# Patient Record
Sex: Female | Born: 1950 | Race: White | Hispanic: No | Marital: Married | Smoking: Never smoker
Health system: Southern US, Community
[De-identification: ages and names within clinical notes are randomized; demographics above are authoritative.]

## PROBLEM LIST (undated history)

## (undated) DIAGNOSIS — K589 Irritable bowel syndrome without diarrhea: Secondary | ICD-10-CM

## (undated) DIAGNOSIS — Z9889 Other specified postprocedural states: Secondary | ICD-10-CM

## (undated) DIAGNOSIS — Z8052 Family history of malignant neoplasm of bladder: Secondary | ICD-10-CM

## (undated) DIAGNOSIS — H269 Unspecified cataract: Secondary | ICD-10-CM

## (undated) DIAGNOSIS — R42 Dizziness and giddiness: Secondary | ICD-10-CM

## (undated) DIAGNOSIS — Z8719 Personal history of other diseases of the digestive system: Secondary | ICD-10-CM

## (undated) DIAGNOSIS — Z923 Personal history of irradiation: Secondary | ICD-10-CM

## (undated) DIAGNOSIS — I10 Essential (primary) hypertension: Secondary | ICD-10-CM

## (undated) DIAGNOSIS — M1990 Unspecified osteoarthritis, unspecified site: Secondary | ICD-10-CM

## (undated) DIAGNOSIS — T7840XA Allergy, unspecified, initial encounter: Secondary | ICD-10-CM

## (undated) DIAGNOSIS — K760 Fatty (change of) liver, not elsewhere classified: Secondary | ICD-10-CM

## (undated) DIAGNOSIS — Z800 Family history of malignant neoplasm of digestive organs: Secondary | ICD-10-CM

## (undated) DIAGNOSIS — R011 Cardiac murmur, unspecified: Secondary | ICD-10-CM

## (undated) DIAGNOSIS — R252 Cramp and spasm: Secondary | ICD-10-CM

## (undated) DIAGNOSIS — R12 Heartburn: Secondary | ICD-10-CM

## (undated) DIAGNOSIS — M8580 Other specified disorders of bone density and structure, unspecified site: Secondary | ICD-10-CM

## (undated) DIAGNOSIS — C541 Malignant neoplasm of endometrium: Secondary | ICD-10-CM

## (undated) DIAGNOSIS — C50211 Malignant neoplasm of upper-inner quadrant of right female breast: Secondary | ICD-10-CM

## (undated) DIAGNOSIS — R112 Nausea with vomiting, unspecified: Secondary | ICD-10-CM

## (undated) DIAGNOSIS — R0600 Dyspnea, unspecified: Secondary | ICD-10-CM

## (undated) DIAGNOSIS — K219 Gastro-esophageal reflux disease without esophagitis: Secondary | ICD-10-CM

## (undated) DIAGNOSIS — E785 Hyperlipidemia, unspecified: Secondary | ICD-10-CM

## (undated) DIAGNOSIS — D649 Anemia, unspecified: Secondary | ICD-10-CM

## (undated) DIAGNOSIS — E039 Hypothyroidism, unspecified: Secondary | ICD-10-CM

## (undated) DIAGNOSIS — H18519 Endothelial corneal dystrophy, unspecified eye: Secondary | ICD-10-CM

## (undated) DIAGNOSIS — I864 Gastric varices: Secondary | ICD-10-CM

## (undated) DIAGNOSIS — C55 Malignant neoplasm of uterus, part unspecified: Secondary | ICD-10-CM

## (undated) DIAGNOSIS — C801 Malignant (primary) neoplasm, unspecified: Secondary | ICD-10-CM

## (undated) DIAGNOSIS — E669 Obesity, unspecified: Secondary | ICD-10-CM

## (undated) DIAGNOSIS — A159 Respiratory tuberculosis unspecified: Secondary | ICD-10-CM

## (undated) DIAGNOSIS — H1851 Endothelial corneal dystrophy: Secondary | ICD-10-CM

## (undated) DIAGNOSIS — I8500 Esophageal varices without bleeding: Secondary | ICD-10-CM

## (undated) HISTORY — DX: Cardiac murmur, unspecified: R01.1

## (undated) HISTORY — DX: Fatty (change of) liver, not elsewhere classified: K76.0

## (undated) HISTORY — DX: Other specified disorders of bone density and structure, unspecified site: M85.80

## (undated) HISTORY — DX: Anemia, unspecified: D64.9

## (undated) HISTORY — DX: Esophageal varices without bleeding: I85.00

## (undated) HISTORY — DX: Unspecified osteoarthritis, unspecified site: M19.90

## (undated) HISTORY — DX: Obesity, unspecified: E66.9

## (undated) HISTORY — DX: Malignant neoplasm of uterus, part unspecified: C55

## (undated) HISTORY — DX: Malignant neoplasm of upper-inner quadrant of right female breast: C50.211

## (undated) HISTORY — DX: Irritable bowel syndrome, unspecified: K58.9

## (undated) HISTORY — PX: EYE SURGERY: SHX253

## (undated) HISTORY — DX: Allergy, unspecified, initial encounter: T78.40XA

## (undated) HISTORY — DX: Malignant neoplasm of endometrium: C54.1

## (undated) HISTORY — DX: Hyperlipidemia, unspecified: E78.5

## (undated) HISTORY — PX: COLONOSCOPY: SHX174

## (undated) HISTORY — PX: CATARACT EXTRACTION, BILATERAL: SHX1313

## (undated) HISTORY — PX: ABDOMINAL HYSTERECTOMY: SHX81

## (undated) HISTORY — DX: Heartburn: R12

## (undated) HISTORY — DX: Hypothyroidism, unspecified: E03.9

## (undated) HISTORY — DX: Family history of malignant neoplasm of bladder: Z80.52

## (undated) HISTORY — DX: Family history of malignant neoplasm of digestive organs: Z80.0

## (undated) HISTORY — DX: Malignant (primary) neoplasm, unspecified: C80.1

## (undated) HISTORY — DX: Essential (primary) hypertension: I10

---

## 1999-06-25 ENCOUNTER — Encounter: Payer: Self-pay | Admitting: Internal Medicine

## 2003-11-26 ENCOUNTER — Ambulatory Visit (HOSPITAL_COMMUNITY): Admission: RE | Admit: 2003-11-26 | Discharge: 2003-11-26 | Payer: Self-pay | Admitting: *Deleted

## 2004-11-25 ENCOUNTER — Encounter: Payer: Self-pay | Admitting: Internal Medicine

## 2005-12-18 ENCOUNTER — Other Ambulatory Visit: Admission: RE | Admit: 2005-12-18 | Discharge: 2005-12-18 | Payer: Self-pay | Admitting: *Deleted

## 2006-11-13 ENCOUNTER — Encounter (INDEPENDENT_AMBULATORY_CARE_PROVIDER_SITE_OTHER): Payer: Self-pay | Admitting: *Deleted

## 2006-11-13 ENCOUNTER — Ambulatory Visit (HOSPITAL_COMMUNITY): Admission: RE | Admit: 2006-11-13 | Discharge: 2006-11-13 | Payer: Self-pay | Admitting: Family Medicine

## 2007-04-08 ENCOUNTER — Other Ambulatory Visit: Admission: RE | Admit: 2007-04-08 | Discharge: 2007-04-08 | Payer: Self-pay | Admitting: *Deleted

## 2007-04-22 ENCOUNTER — Telehealth: Payer: Self-pay | Admitting: Family Medicine

## 2007-04-22 ENCOUNTER — Ambulatory Visit: Payer: Self-pay | Admitting: Family Medicine

## 2007-04-22 DIAGNOSIS — H9190 Unspecified hearing loss, unspecified ear: Secondary | ICD-10-CM | POA: Insufficient documentation

## 2007-04-22 DIAGNOSIS — E785 Hyperlipidemia, unspecified: Secondary | ICD-10-CM | POA: Insufficient documentation

## 2007-05-04 ENCOUNTER — Encounter: Payer: Self-pay | Admitting: Family Medicine

## 2007-05-06 ENCOUNTER — Encounter: Payer: Self-pay | Admitting: Family Medicine

## 2007-05-06 LAB — CONVERTED CEMR LAB
ALT: 37 units/L — ABNORMAL HIGH (ref 0–35)
AST: 19 units/L (ref 0–37)
Albumin: 4.4 g/dL (ref 3.5–5.2)
Calcium: 9.6 mg/dL (ref 8.4–10.5)
Creatinine, Ser: 0.88 mg/dL (ref 0.40–1.20)
HDL goal, serum: 40 mg/dL
Potassium: 4.1 meq/L (ref 3.5–5.3)
Total Bilirubin: 0.7 mg/dL (ref 0.3–1.2)
Total Protein: 7.2 g/dL (ref 6.0–8.3)
Triglycerides: 190 mg/dL — ABNORMAL HIGH (ref <150)

## 2007-05-24 ENCOUNTER — Ambulatory Visit: Payer: Self-pay | Admitting: Family Medicine

## 2007-06-01 ENCOUNTER — Encounter: Payer: Self-pay | Admitting: Family Medicine

## 2007-06-02 ENCOUNTER — Telehealth: Payer: Self-pay | Admitting: Family Medicine

## 2007-06-02 ENCOUNTER — Encounter: Payer: Self-pay | Admitting: Family Medicine

## 2008-11-17 ENCOUNTER — Ambulatory Visit: Payer: Self-pay | Admitting: Family Medicine

## 2009-01-16 ENCOUNTER — Ambulatory Visit: Payer: Self-pay | Admitting: Family Medicine

## 2009-01-16 DIAGNOSIS — I10 Essential (primary) hypertension: Secondary | ICD-10-CM | POA: Insufficient documentation

## 2009-01-29 ENCOUNTER — Encounter: Payer: Self-pay | Admitting: Family Medicine

## 2009-01-30 ENCOUNTER — Encounter: Payer: Self-pay | Admitting: Family Medicine

## 2009-01-31 DIAGNOSIS — R740 Nonspecific elevation of levels of transaminase and lactic acid dehydrogenase [LDH]: Secondary | ICD-10-CM

## 2009-01-31 DIAGNOSIS — R7401 Elevation of levels of liver transaminase levels: Secondary | ICD-10-CM | POA: Insufficient documentation

## 2009-01-31 LAB — CONVERTED CEMR LAB
ALT: 73 U/L — ABNORMAL HIGH
AST: 41 U/L — ABNORMAL HIGH
Albumin: 4.3 g/dL
Alkaline Phosphatase: 51 U/L
BUN: 13 mg/dL
CO2: 25 meq/L
Calcium: 9.6 mg/dL
Chloride: 103 meq/L
Cholesterol: 287 mg/dL — ABNORMAL HIGH
Creatinine, Ser: 0.92 mg/dL
Glucose, Bld: 100 mg/dL — ABNORMAL HIGH
HDL: 45 mg/dL
Hep A IgM: NEGATIVE
LDL Cholesterol: 210 mg/dL — ABNORMAL HIGH
Potassium: 3.9 meq/L
Sodium: 141 meq/L
Total Bilirubin: 0.6 mg/dL
Total CHOL/HDL Ratio: 6.4
Total Protein: 7.2 g/dL
Triglycerides: 161 mg/dL — ABNORMAL HIGH
VLDL: 32 mg/dL

## 2009-02-06 ENCOUNTER — Ambulatory Visit (HOSPITAL_COMMUNITY): Admission: RE | Admit: 2009-02-06 | Discharge: 2009-02-06 | Payer: Self-pay | Admitting: Family Medicine

## 2009-02-07 ENCOUNTER — Encounter: Payer: Self-pay | Admitting: Family Medicine

## 2009-02-09 ENCOUNTER — Ambulatory Visit: Payer: Self-pay | Admitting: Family Medicine

## 2009-02-09 LAB — CONVERTED CEMR LAB: Cholesterol, target level: 200 mg/dL

## 2009-06-08 ENCOUNTER — Ambulatory Visit: Payer: Self-pay | Admitting: Family Medicine

## 2009-06-29 ENCOUNTER — Ambulatory Visit: Payer: Self-pay | Admitting: Family Medicine

## 2009-06-29 ENCOUNTER — Telehealth: Payer: Self-pay | Admitting: Internal Medicine

## 2009-07-03 ENCOUNTER — Encounter: Payer: Self-pay | Admitting: Family Medicine

## 2009-07-19 ENCOUNTER — Telehealth: Payer: Self-pay | Admitting: Family Medicine

## 2009-07-26 DIAGNOSIS — K648 Other hemorrhoids: Secondary | ICD-10-CM | POA: Insufficient documentation

## 2009-07-26 DIAGNOSIS — K760 Fatty (change of) liver, not elsewhere classified: Secondary | ICD-10-CM | POA: Insufficient documentation

## 2009-07-26 DIAGNOSIS — Z860100 Personal history of colon polyps, unspecified: Secondary | ICD-10-CM | POA: Insufficient documentation

## 2009-07-26 DIAGNOSIS — K208 Other esophagitis without bleeding: Secondary | ICD-10-CM | POA: Insufficient documentation

## 2009-07-26 DIAGNOSIS — K5730 Diverticulosis of large intestine without perforation or abscess without bleeding: Secondary | ICD-10-CM | POA: Insufficient documentation

## 2009-07-30 ENCOUNTER — Ambulatory Visit: Payer: Self-pay | Admitting: Internal Medicine

## 2009-09-18 ENCOUNTER — Ambulatory Visit: Payer: Self-pay | Admitting: Internal Medicine

## 2009-10-05 ENCOUNTER — Telehealth: Payer: Self-pay | Admitting: Family Medicine

## 2010-03-08 ENCOUNTER — Ambulatory Visit: Payer: Self-pay | Admitting: Family Medicine

## 2010-03-08 ENCOUNTER — Encounter: Admission: RE | Admit: 2010-03-08 | Discharge: 2010-03-08 | Payer: Self-pay | Admitting: Family Medicine

## 2010-03-08 DIAGNOSIS — M479 Spondylosis, unspecified: Secondary | ICD-10-CM | POA: Insufficient documentation

## 2010-03-08 DIAGNOSIS — Z780 Asymptomatic menopausal state: Secondary | ICD-10-CM | POA: Insufficient documentation

## 2010-03-12 LAB — CONVERTED CEMR LAB
ALT: 79 units/L — ABNORMAL HIGH (ref 0–35)
AST: 47 units/L — ABNORMAL HIGH (ref 0–37)
Albumin: 4.3 g/dL (ref 3.5–5.2)
Alkaline Phosphatase: 46 units/L (ref 39–117)
BUN: 13 mg/dL (ref 6–23)
CO2: 25 meq/L (ref 19–32)
Calcium: 9.8 mg/dL (ref 8.4–10.5)
Chloride: 101 meq/L (ref 96–112)
Sodium: 139 meq/L (ref 135–145)
Total Bilirubin: 0.6 mg/dL (ref 0.3–1.2)
Total CHOL/HDL Ratio: 5.4
VLDL: 33 mg/dL (ref 0–40)

## 2010-07-10 ENCOUNTER — Ambulatory Visit: Payer: Self-pay | Admitting: Family Medicine

## 2010-07-10 DIAGNOSIS — M62838 Other muscle spasm: Secondary | ICD-10-CM | POA: Insufficient documentation

## 2010-07-10 DIAGNOSIS — L821 Other seborrheic keratosis: Secondary | ICD-10-CM | POA: Insufficient documentation

## 2010-07-11 ENCOUNTER — Encounter: Payer: Self-pay | Admitting: Family Medicine

## 2010-07-12 LAB — CONVERTED CEMR LAB
Albumin: 4 g/dL (ref 3.5–5.2)
Alkaline Phosphatase: 37 units/L — ABNORMAL LOW (ref 39–117)
BUN: 13 mg/dL (ref 6–23)
CO2: 27 meq/L (ref 19–32)
Chloride: 102 meq/L (ref 96–112)
Creatinine, Ser: 0.89 mg/dL (ref 0.40–1.20)
HDL: 51 mg/dL (ref >39)
Total Bilirubin: 0.7 mg/dL (ref 0.3–1.2)
Total CHOL/HDL Ratio: 3.4
VLDL: 21 mg/dL (ref 0–40)

## 2010-08-05 ENCOUNTER — Telehealth: Payer: Self-pay | Admitting: Family Medicine

## 2010-08-06 ENCOUNTER — Telehealth: Payer: Self-pay | Admitting: Family Medicine

## 2010-08-19 ENCOUNTER — Encounter
Admission: RE | Admit: 2010-08-19 | Discharge: 2010-09-12 | Payer: Self-pay | Source: Home / Self Care | Attending: Family Medicine | Admitting: Family Medicine

## 2010-08-21 ENCOUNTER — Encounter: Payer: Self-pay | Admitting: Family Medicine

## 2010-09-17 ENCOUNTER — Encounter
Admission: RE | Admit: 2010-09-17 | Discharge: 2010-10-15 | Payer: Self-pay | Source: Home / Self Care | Attending: Family Medicine | Admitting: Family Medicine

## 2010-09-17 ENCOUNTER — Encounter: Admission: RE | Admit: 2010-09-17 | Payer: Self-pay | Source: Home / Self Care | Admitting: Family Medicine

## 2010-09-23 ENCOUNTER — Encounter: Payer: Self-pay | Admitting: Family Medicine

## 2010-10-15 NOTE — Assessment & Plan Note (Signed)
Summary: 4 mo. f/u HTN, lipids, trap pain    Vital Signs:  Patient profile:   60 year old female Height:      64 inches Weight:      177 pounds Pulse rate:   78 / minute BP sitting:   127 / 84  (right arm) Cuff size:   regular  Vitals Entered By: Avon Gully CMA, (AAMA) (July 10, 2010 9:30 AM) CC: f/u BP and cholesterol pt is not fasting this am   Primary Care Provider:  Timothy Lasso, MD  CC:  f/u BP and cholesterol pt is not fasting this am.  History of Present Illness: f/u BP and cholesterol pt is not fasting this am. Doing well on teh crestor. No myalgias. Has been walking 3 miles at nightl. Moved his her ASA at night. 81mg .   Getting cramps in her left shouder at work. Does work at the computer all day. It gets really tight. Wil luse IBU at night for pain relief. Says can feel a knot there when it bothers here. No real alleviating sxs.    Has a moleon the left inner thigh that shew would like me to look at. Says it rubs on her pants and gets very sore and tender.   Current Medications (verified): 1)  Multivitamin/iron   Tabs (Multiple Vitamins-Iron) .... Take One Tablet By Mouth Once Aday 2)  Fish Oil 1200 Mg Caps (Omega-3 Fatty Acids) .Marland Kitchen.. 1 Tab By Mouth Once Daily 3)  Benicar Hct 20-12.5 Mg Tabs (Olmesartan Medoxomil-Hctz) .... Take 1 Tablet By Mouth Once A Day 4)  Crestor 10 Mg Tabs (Rosuvastatin Calcium) .... Take 1 Tablet By Mouth Once A Day 5)  Aspirin 81 Mg Tbec (Aspirin) .... Take 1 Tab By Mouth Once Daily 6)  Nasonex 50 Mcg/act Susp (Mometasone Furoate) .... 2 Sprays Per Nostril Once Daily  Allergies (verified): 1)  ! Pcn  Comments:  Nurse/Medical Assistant: The patient's medications and allergies were reviewed with the patient and were updated in the Medication and Allergy Lists. 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  Married to [place] with 2 adult children.  Lives with her husband and step daughter.   Never Smoked Alcohol use-no Drug use-no Regular exercise-yes  Physical Exam  General:  Well-developed,well-nourished,in no acute distress; alert,appropriate and cooperative throughout examination Head:  Normocephalic and atraumatic without obvious abnormalities. No apparent alopecia or balding. Eyes:  No corneal or conjunctival inflammation noted. EOMI. Perrla. Lungs:  Normal respiratory effort, chest expands symmetrically. Lungs are clear to auscultation, no crackles or wheezes. Heart:  Normal rate and regular rhythm. S1 and S2 normal without gallop, murmur, click, rub or other extra sounds. Msk:  Left shoulder and neck with NROM. Stegnth 5/5 in the left shoulder, elbow and hand. She is mildy tender over teh trap area.  Skin:  Left inner thigh with 0.5cm seb keratosis.    Impression & Recommendations:  Problem # 1:  HYPERTENSION, BENIGN (ICD-401.1) Blood pressure is at goal.   Her updated medication list for this problem includes:    Benicar Hct 20-12.5 Mg Tabs (Olmesartan medoxomil-hctz) .Marland Kitchen... Take 1 tablet by mouth once a day  BP today: 127/84 Prior BP: 136/90 (03/08/2010)  Prior 10 Yr Risk Heart Disease: 17 % (06/29/2009)  Labs Reviewed: K+: 3.9 (03/08/2010) Creat: : 0.90 (03/08/2010)   Chol: 282 (03/08/2010)   HDL: 52 (03/08/2010)  LDL: 197 (03/08/2010)   TG: 166 (03/08/2010)  Problem # 2:  HYPERLIPIDEMIA (ICD-272.4) Due to recheck levels on the crestor.  Her updated medication list for this problem includes:    Crestor 10 Mg Tabs (Rosuvastatin calcium) .Marland Kitchen... Take 1 tablet by mouth once a day  Orders: T-Lipid Profile (04540-98119) T-Comprehensive Metabolic Panel (14782-95621)  Problem # 3:  SEBORRHEIC KERATOSIS (ICD-702.19)  Performed cryotherapy. Pt tolerated well and f/u wound instructions reviewed.   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 Hopefully tehy can also help with the ergonomics of her work station as well.  Orders: Physical Therapy Referral (PT)  Complete Medication List: 1)  Multivitamin/iron Tabs (Multiple vitamins-iron) .... Take one tablet by mouth once aday 2)  Fish Oil 1200 Mg Caps (omega-3 Fatty Acids)  .Marland Kitchen.. 1 tab by mouth once daily 3)  Benicar Hct 20-12.5 Mg Tabs (Olmesartan medoxomil-hctz) .... Take 1 tablet by mouth once a day 4)  Crestor 10 Mg Tabs (Rosuvastatin calcium) .... Take 1 tablet by mouth once a day 5)  Aspirin 81 Mg Tbec (Aspirin) .... Take 1 tab by mouth once daily 6)  Nasonex 50 Mcg/act Susp (Mometasone furoate) .... 2 sprays per nostril once daily  Patient Instructions: 1)  We will call you with the referrral to Physical therapy 2)  Can wash lesion in the shower. Will peel off in 1-2 weeks. Can apply vaseline to promote healing once peels off. 3)  We will call you with your lab results.  4)  Follow up in 6 weeks for your shoulder.    Orders Added: 1)  T-Lipid Profile [80061-22930] 2)  T-Comprehensive Metabolic Panel [80053-22900] 3)  Physical Therapy Referral [PT] 4)  Est. Patient Level IV [30865] 5)  Cryotherapy/Destruction benign or premalignant lesion (1st lesion)  [17000]

## 2010-10-15 NOTE — Assessment & Plan Note (Signed)
Summary: CPE w/o pap   Vital Signs:  Patient profile:   60 year old female Height:      64 inches Weight:      176 pounds BMI:     30.32 O2 Sat:      98 % on Room air Pulse rate:   67 / minute BP sitting:   136 / 90  (left arm) Cuff size:   regular  Vitals Entered By: Payton Spark CMA (March 08, 2010 9:05 AM)  O2 Flow:  Room air CC: CPE w/ out pap   Primary Care Provider:  Timothy Lasso, MD  CC:  CPE w/ out pap.  History of Present Illness: 60 yo WF presents for CPE without pap smear.  She is postmenopausal x 3 yrs, sees Dr Rana Snare for pap smears.  She is not on HRT.  Had her colonoscopy in Jan w/ Dr Juanda Chance, normal other than Diverticulosis.    She is due for her mammogram and DEXA Scan.  she is due for fasting labs, an annual eye exam and an annual dermatology visit for hx of abnormal moles.  She is due for RF of meds.  C/O pain over L thenar eminence after a fall 2 wks ago.  Full ROM and strength w/o pain in the wrist.  No redness, bruising or swelling.    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      Current Medications (verified): 1)  Multivitamin/iron   Tabs (Multiple Vitamins-Iron) .... Take One Tablet By Mouth Once Aday 2)  Adult Aspirin Ec Low Strength 81 Mg  Tbec (Aspirin) .... Take One Tablet By Mouth Once A Day 3)  Fish Oil 1200 Mg Caps (Omega-3 Fatty Acids) .Marland Kitchen.. 1 Tab By Mouth Once Daily 4)  Benicar Hct 20-12.5 Mg Tabs (Olmesartan Medoxomil-Hctz) .... Take 1 Tablet By Mouth Once A Day 5)  Crestor 10 Mg Tabs (Rosuvastatin Calcium) .... Take 1 Tablet By Mouth Once A Day  Allergies (verified): 1)  ! Pcn  Past History:  Past Medical History: Reviewed history from 07/30/2009 and no changes required. 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  OA of the neck.  Anemia Arthritis Adenomatous Colon Polyps Hypertension Hyperthyroidism Irritable  Bowel Syndrome Obesity  Past Surgical History: Reviewed history from 04/22/2007 and no changes required. None  Family History: Reviewed history from 07/30/2009 and no changes required. Father died Bladder Ca, MI in his 86s, Hi chol Mother died from stroke Family History of CAD Female 1st degree relative <50 Family History of Colon Cancer:Father Family History of Diabetes: GF Family History of Kidney Disease:GM  Social History: Reviewed history from 07/26/2009 and no changes required. 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  Lives with her husband and step daughter.   Never Smoked Alcohol use-no Drug use-no Regular exercise-yes  Review of Systems      See HPI  Physical Exam  General:  alert, well-developed, well-nourished, and well-hydrated.  obese Head:  normocephalic and atraumatic.   Eyes:  pupils equal, pupils round, and pupils reactive to light.   Ears:  EACs patent; TMs translucent and gray with good cone of light and bony landmarks.  Nose:  no nasal discharge.   Mouth:  pharynx pink and moist and fair dentition.   Neck:  no masses.   Lungs:  Normal respiratory effort, chest expands  symmetrically. Lungs are clear to auscultation, no crackles or wheezes. Heart:  Normal rate and regular rhythm. S1 and S2 normal without gallop, murmur, click, rub or other extra sounds. Abdomen:  Bowel sounds positive,abdomen soft and non-tender without masses, organomegaly or hernias noted. Msk:  normal appearing L hand.  slight point tenderness over L thenar eminence w/o skin changes or edema.  full L forearm ROM and full ROM and strenghth with grip strength and opposition R and L hands. Pulses:  2+ radial and pedal pulses Extremities:  no E/C/C Skin:  fair skinned with sun damage Cervical Nodes:  No lymphadenopathy noted Psych:  good eye contact, not anxious appearing, and not depressed appearing.     Impression &  Recommendations:  Problem # 1:  HEALTH SCREENING (ICD-V70.0) Keeping healthy checklist for women reviewed. BP at goal, DBP at the ULN.  RFd meds. Update labs. Update Mammo + DEXA. Work on [employer], regular exercise, wt loss for BMI >30 c/w class I obesity. MVI + Calcium with D recommended. She has f/u with optometry and her dermatolgist for annual surveillance. Colonoscopy done Jan 2011 with Dr Juanda Chance.  Complete Medication List: 1)  Multivitamin/iron Tabs (Multiple vitamins-iron) .... Take one tablet by mouth once aday 2)  Adult Aspirin Ec Low Strength 81 Mg Tbec (Aspirin) .... Take one tablet by mouth once a day 3)  Fish Oil 1200 Mg Caps (omega-3 Fatty Acids)  .Marland Kitchen.. 1 tab by mouth once daily 4)  Benicar Hct 20-12.5 Mg Tabs (Olmesartan medoxomil-hctz) .... Take 1 tablet by mouth once a day 5)  Crestor 10 Mg Tabs (Rosuvastatin calcium) .... Take 1 tablet by mouth once a day 6)  Aspirin 81 Mg Tbec (Aspirin) .... Take 1 tab by mouth once daily 7)  Nasonex 50 Mcg/act Susp (Mometasone furoate) .... 2 sprays per nostril once daily  Other Orders: T-Mammography Bilateral Screening (40347) T-DXA Bone Density/ Appendicular (42595) T-Dual DXA Bone Density/ Axial (63875) T-Comprehensive Metabolic Panel (64332-95188) T-Lipid Profile (41660-63016) T-DG Cervical Spine 2-3 Views (01093) Prescriptions: CRESTOR 10 MG TABS (ROSUVASTATIN CALCIUM) Take 1 tablet by mouth once a day  #90 x 1   Entered and Authorized by:   Seymour Bars DO   Signed by:   Seymour Bars DO on 03/08/2010   Method used:   Electronically to        [facility]* (retail)       [address]       [address] Shipping/mailing       [place], [place]  [address]       Ph: [phone]       Fax: [phone]   RxID:   [id number] BENICAR HCT 20-12.5 MG TABS (OLMESARTAN MEDOXOMIL-HCTZ) Take 1 tablet by mouth once a day  #90 x 1   Entered and Authorized by:   Seymour Bars DO   Signed by:   Seymour Bars DO on  03/08/2010   Method used:   Electronically to        [facility]* (retail)       [address]       [address] Shipping/mailing       [place], [place]  [address]       Ph: [phone]       Fax: [phone]   RxID:   [id number]

## 2010-10-15 NOTE — Procedures (Signed)
Summary: Colonoscopy  Patient: Dawn [name] Note: All result statuses are Final unless otherwise noted.  Tests: (1) Colonoscopy (COL)   COL Colonoscopy           DONE (C)     [facility]     [address]     [place], [place]  [address]           COLONOSCOPY PROCEDURE REPORT           PATIENT:  Dawn [name], Dawn [name]  MR#:  [id number]     BIRTHDATE:  04/26/1951, 58 yrs. old  GENDER:  female           ENDOSCOPIST:  Hedwig Morton. Juanda Chance, MD     Referred by:  Nani Gasser, M.D.           PROCEDURE DATE:  09/18/2009     PROCEDURE:  Colonoscopy 19147     ASA CLASS:  Class I     INDICATIONS:  family history of colon cancer, family Hx of polyps     father with colon cancer, brother with colon polyps,     hyperplastic polyp 2006,, prior colon in 2000,           MEDICATIONS:   Fentanyl 100 mcg, Versed 8 mg IV           DESCRIPTION OF PROCEDURE:   After the risks benefits and     alternatives of the procedure were thoroughly explained, informed     consent was obtained.  Digital rectal exam was performed and     revealed no rectal masses.   The LB CF-H180AL [id number] endoscope     was introduced through the anus and advanced to the cecum, which     was identified by both the appendix and ileocecal valve, without     limitations.  The quality of the prep was good, using MiraLax.     The instrument was then slowly withdrawn as the colon was fully     examined.     <<PROCEDUREIMAGES>>           FINDINGS:  Mild diverticulosis was found in the sigmoid colon (see     image1, image6, and image5).  This was otherwise a normal     examination of the colon (see image7, image4, image3, and image2).     Retroflexed views in the rectum revealed no abnormalities.    The     scope was then withdrawn from the patient and the procedure     completed.           COMPLICATIONS:  None           ENDOSCOPIC IMPRESSION:     1) Mild diverticulosis in the sigmoid colon     2) Otherwise normal  examination     RECOMMENDATIONS:     1) high fiber diet           REPEAT EXAM:  In 5 year(s) for.           ______________________________     Hedwig Morton. Juanda Chance, MD           CC:           n.     REVISED:  09/19/2009 02:55 PM     eSIGNED:   Hedwig Morton. Serayah Yazdani at 09/19/2009 02:55 PM           Loletha Carrow, [id number]  Note: An exclamation mark (!) indicates a result that was not dispersed into the  flowsheet. Document Creation Date: 09/19/2009 2:54 PM _______________________________________________________________________  (1) Order result status: Final Collection or observation date-time: 09/18/2009 09:34 Requested date-time:  Receipt date-time:  Reported date-time:  Referring Physician:   Ordering Physician: Lina Sar [phone]) Specimen Source:  Source: Launa Grill Order Number: [id number] Lab site:

## 2010-10-15 NOTE — Procedures (Signed)
Summary: Colonoscopy  Patient: Dawn [name] Note: All result statuses are Final unless otherwise noted.  Tests: (1) Colonoscopy (COL)   COL Colonoscopy           DONE     [facility]     [address]     [place], [place]  [address]           COLONOSCOPY PROCEDURE REPORT           PATIENT:  Dawn [name], Dawn [name]  MR#:  [id number]     BIRTHDATE:  [birth year], 58 yrs. old  GENDER:  female           ENDOSCOPIST:  Hedwig Morton. Juanda Chance, MD     Referred by:  Nani Gasser, M.D.           PROCEDURE DATE:  09/18/2009     PROCEDURE:  Colonoscopy 19147     ASA CLASS:  Class I     INDICATIONS:  family history of colon cancer, family Hx of polyps     father with colon cancer, brother with colon polyps,     hyperplastic polyp 2006,, prior colon in 2000,           MEDICATIONS:   Versed 9 mg, Fentanyl 100 mcg           DESCRIPTION OF PROCEDURE:   After the risks benefits and     alternatives of the procedure were thoroughly explained, informed     consent was obtained.  Digital rectal exam was performed and     revealed no rectal masses.   The LB CF-H180AL [id number] endoscope     was introduced through the anus and advanced to the cecum, which     was identified by both the appendix and ileocecal valve, without     limitations.  The quality of the prep was good, using MiraLax.     The instrument was then slowly withdrawn as the colon was fully     examined.     <<PROCEDUREIMAGES>>           FINDINGS:  Mild diverticulosis was found in the sigmoid colon (see     image1, image6, and image5).  This was otherwise a normal     examination of the colon (see image7, image4, image3, and image2).     Retroflexed views in the rectum revealed no abnormalities.    The     scope was then withdrawn from the patient and the procedure     completed.           COMPLICATIONS:  None           ENDOSCOPIC IMPRESSION:     1) Mild diverticulosis in the sigmoid colon     2) Otherwise normal examination  RECOMMENDATIONS:     1) high fiber diet           REPEAT EXAM:  In 5 year(s) for.           ______________________________     Hedwig Morton. Juanda Chance, MD           CC:           n.     eSIGNED:   Hedwig Morton. Haylo Fake at 09/18/2009 09:43 AM           Loletha Carrow, [id number]  Note: An exclamation mark (!) indicates a result that was not dispersed into the flowsheet. Document Creation Date: 09/18/2009 9:43 AM _______________________________________________________________________  (1) Order result status: Final Collection  or observation date-time: 09/18/2009 09:34 Requested date-time:  Receipt date-time:  Reported date-time:  Referring Physician:   Ordering Physician: Lina Sar [phone]) Specimen Source:  Source: Launa Grill Order Number: [id number] Lab site:   Appended Document: Colonoscopy    Clinical Lists Changes  Observations: Added new observation of COLONNXTDUE: 09/2014 (09/18/2009 10:52)

## 2010-10-15 NOTE — Progress Notes (Signed)
Summary: rash  Phone Note Call from Patient Call back at Home Phone [phone]   Caller: Patient Call For: Nani Gasser MD Summary of Call: Pt calls and states you gave her some Nystatin cream for her right inner thigh rash and now has rash on left inner thigh and wanted to know if she could get a refill on this sent to [facility] in [place] Initial call taken by: Kathlene November,  October 05, 2009 10:15 AM  Follow-up for Phone Call        OK to refill.  Follow-up by: Nani Gasser MD,  October 05, 2009 10:28 AM    New/Updated Medications: NYSTATIN 100000 UNIT/GM CREA (NYSTATIN) Apply two times a day to affected area for 14 days Prescriptions: NYSTATIN 100000 UNIT/GM CREA (NYSTATIN) Apply two times a day to affected area for 14 days  #1 tube x 1   Entered and Authorized by:   Nani Gasser MD   Signed by:   Nani Gasser MD on 10/05/2009   Method used:   Electronically to        [facility]* (retail)       [address] [place], [place]  [address]       Ph: [phone]       Fax: [phone]   RxID:   [id number]

## 2010-10-15 NOTE — Progress Notes (Signed)
Summary: needs a new PT referral  Phone Note Call from Patient   Caller: Patient Summary of Call: Pt .needs a new referral because it has to be 30 days from her appt date and she is now scheduled for 12/9, and just now called PT  back today.Michaelle Copas  August 06, 2010 1:44 PM  Initial call taken by: Michaelle Copas,  August 06, 2010 1:44 PM  Follow-up for Phone Call        New order put in.  Follow-up by: Nani Gasser MD,  August 06, 2010 2:46 PM

## 2010-10-15 NOTE — Progress Notes (Signed)
Summary: Didn't go to PT  ---- Converted from flag ---- ---- 08/05/2010 9:46 AM, Michaelle Copas wrote: This patient never did schedule her Physical Therapy appt... Physical Therapy has called her and myself has called her and she has not returned our calls.Marland KitchenMarland KitchenJust wanted to let you know.Marland KitchenThanks, Victorino Dike ------------------------------

## 2010-10-17 NOTE — Miscellaneous (Signed)
Summary: PT Discharge/[facility]  PT Discharge/[facility]   Imported By: Lanelle Bal 10/09/2010 11:48:30  _____________________________________________________________________  External Attachment:    Type:   Image     Comment:   External Document

## 2010-10-17 NOTE — Miscellaneous (Signed)
Summary: PT Initial Summary/[facility]  PT Initial [facility]   Imported By: Lanelle Bal 08/30/2010 11:29:04  _____________________________________________________________________  External Attachment:    Type:   Image     Comment:   External Document

## 2011-01-31 ENCOUNTER — Ambulatory Visit (INDEPENDENT_AMBULATORY_CARE_PROVIDER_SITE_OTHER): Payer: 59 | Admitting: Family Medicine

## 2011-01-31 ENCOUNTER — Encounter: Payer: Self-pay | Admitting: Family Medicine

## 2011-01-31 VITALS — BP 134/87 | HR 85 | Temp 98.5°F | Ht 64.0 in

## 2011-01-31 DIAGNOSIS — J029 Acute pharyngitis, unspecified: Secondary | ICD-10-CM

## 2011-01-31 DIAGNOSIS — H8109 Meniere's disease, unspecified ear: Secondary | ICD-10-CM | POA: Insufficient documentation

## 2011-01-31 LAB — POCT RAPID STREP A (OFFICE): Rapid Strep A Screen: NEGATIVE

## 2011-01-31 MED ORDER — PROMETHAZINE HCL 25 MG PO TABS: 25.0000 mg | ORAL_TABLET | Freq: Four times a day (QID) | ORAL | Status: AC | PRN

## 2011-01-31 NOTE — Progress Notes (Signed)
  Subjective:    Patient ID: Dawn [name], female    DOB: Dec 14, 1950, 60 y.o.   MRN: [id number]  HPI Hx of menieres.  Had an episode last months.  She was on oral  Steroids  For 5 days last monght.  She was also given meclizine 25mg  bid ot use prn. She is also having ST now.  Episodes started again Monday night ( 5 days ago). Felt so dizzy she vomited. She doesn't have any thing for nausea. She thinks she is getting a cold as she has had a ST. Husband has been sick as well with cough etc. No fever or cough.   She feels her mouth is dry all the time.  Wonders if she should dec her diuretic.    Review of Systems     Objective:   Physical Exam  Constitutional: She appears well-developed and well-nourished.  HENT:  Head: Normocephalic and atraumatic.  Right Ear: External ear normal.  Left Ear: External ear normal.  Nose: Nose normal.       TMs and canals are clear bilaterally. OP with mild erythenma  Eyes: Conjunctivae are normal. Pupils are equal, round, and reactive to light.  Neck: Neck supple. No thyromegaly present.  Cardiovascular: Normal rate, regular rhythm and normal heart sounds.   Pulmonary/Chest: Effort normal and breath sounds normal.  Lymphadenopathy:    She has no cervical adenopathy.          Assessment & Plan:  Pharyngitis - Likely sore post vomiting. Strep test is neg. Or could be early viral syndrome.  Call if getting worse or not better in one week.  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 I really don't think we should dec her diuretic as I really think this is what has helped control her sxs for the last 2 years.  If she can tolerated the dry mouth I rec sticking with it but certainly she can talk to her ENT about it and we cna manage her BP otherwise.

## 2011-01-31 NOTE — Patient Instructions (Signed)
Salt water gargles, Zinc lozenges.

## 2011-03-12 ENCOUNTER — Other Ambulatory Visit: Payer: Self-pay | Admitting: Family Medicine

## 2011-03-13 ENCOUNTER — Other Ambulatory Visit: Payer: Self-pay | Admitting: Family Medicine

## 2011-03-13 MED ORDER — OLMESARTAN MEDOXOMIL-HCTZ 20-12.5 MG PO TABS: ORAL_TABLET | ORAL | Status: DC

## 2011-03-21 ENCOUNTER — Encounter: Payer: Self-pay | Admitting: Family Medicine

## 2011-03-27 ENCOUNTER — Encounter: Payer: Self-pay | Admitting: Family Medicine

## 2011-03-27 ENCOUNTER — Ambulatory Visit (INDEPENDENT_AMBULATORY_CARE_PROVIDER_SITE_OTHER): Payer: 59 | Admitting: Family Medicine

## 2011-03-27 VITALS — BP 148/83 | HR 71 | Ht 63.0 in | Wt 181.0 lb

## 2011-03-27 DIAGNOSIS — Z0000 Encounter for general adult medical examination without abnormal findings: Secondary | ICD-10-CM

## 2011-03-27 DIAGNOSIS — L821 Other seborrheic keratosis: Secondary | ICD-10-CM

## 2011-03-27 LAB — LIPID PANEL
Cholesterol: 268 mg/dL — ABNORMAL HIGH (ref 0–200)
HDL: 49 mg/dL (ref >39)
Triglycerides: 140 mg/dL (ref <150)

## 2011-03-27 NOTE — Progress Notes (Signed)
  Subjective:    Patient ID: Dawn [name], female    DOB: March 20, 1951, 60 y.o.   MRN: [id number]  HPI    Review of Systems     Objective:   Physical Exam        Assessment & Plan:   Subjective:     Dawn [name] is a 60 y.o. female and is here for a comprehensive physical exam. The patient reports problems - stil lhaving pain in her shoulders. Did get some better with PT but still has frequent pain. She does work over a [employer]. Feels massage does help and want to know if her insruance will help cover this.  Not sure.  Also has a seb ker on her right hand that she would like frozen. Marland Kitchen  History   Social History  . Marital Status: Married    Spouse Name: N/A    Number of Children: N/A  . Years of Education: N/A   Occupational History  .      [employer]    Social History Main Topics  . Smoking status: Never Smoker   . Smokeless tobacco: Not on file  . Alcohol Use: No  . Drug Use: No  . Sexually Active: Not on file   Other Topics Concern  . Not on file   Social History Narrative   2-3 caffeine drinks per day. Regular exercise.  Walking 3 miles a day.    Health Maintenance  Topic Date Due  . Pap Smear  01/16/1970  . Colonoscopy  01/16/2002  . Influenza Vaccine  06/16/2011  . Mammogram  02/27/2013  . Tetanus/tdap  12/15/2018    The following portions of the patient's history were reviewed and updated as appropriate: allergies, current medications, past family history, past medical history, past social history and past surgical history.  Review of Systems A comprehensive review of systems was negative.   Objective:    BP 148/83  Pulse 71  Ht 5\' 3"  (1.6 m)  Wt 181 lb (82.101 kg)  BMI 32.06 kg/m2 General appearance: alert, cooperative and appears stated age Head: Normocephalic, without obvious abnormality, atraumatic Eyes: conjunctiva is clear. EOMi, PEERLA Ears: normal TM's and external ear canals both ears Nose: Nares normal. Septum midline. Mucosa  normal. No drainage or sinus tenderness. Throat: lips, mucosa, and tongue normal; teeth and gums normal Neck: no adenopathy, no carotid bruit, supple, symmetrical, trachea midline and thyroid not enlarged, symmetric, no tenderness/mass/nodules Back: symmetric, no curvature. ROM normal. No CVA tenderness. Lungs: clear to auscultation bilaterally Heart: regular rate and rhythm, S1, S2 normal, no murmur, click, rub or gallop Abdomen: soft, non-tender; bowel sounds normal; no masses,  no organomegaly Extremities: extremities normal, atraumatic, no cyanosis or edema Pulses: 2+ and symmetric Skin: Skin color, texture, turgor normal. No rashes or lesions Lymph nodes: Cervical, supraclavicular, and axillary nodes normal. Neurologic: Grossly normal    Assessment:    Healthy female exam.      Plan:     See After Visit Summary for Counseling Recommendations  1. Exam is normal 2. Seb Keratosis on back of right hand. Cryotherapy performed. Pt tolerated well 3. Given lab slip 4. Osteopenia - will check Vit D level. 5.Vaccines are uptodate.  6. Colonsocopy is up to date 7. Has appt with gyn- for pap and repeat breat exam. Had abnormal mammo so has repeat in 6 mo.,

## 2011-03-27 NOTE — Patient Instructions (Signed)
Keep up your  regular exercise program and make sure you are eating a healthy diet Try to eat 4 servings of dairy a day or take a calcium supplement (500mg  twice a day).Also make sure has the vitamin D in it too! We will call you with your lab results.  Your vaccines are up to date.

## 2011-03-28 ENCOUNTER — Telehealth: Payer: Self-pay | Admitting: Family Medicine

## 2011-03-28 LAB — COMPLETE METABOLIC PANEL WITH GFR
AST: 40 U/L — ABNORMAL HIGH (ref 0–37)
Alkaline Phosphatase: 42 U/L (ref 39–117)
BUN: 12 mg/dL (ref 6–23)
Calcium: 9.8 mg/dL (ref 8.4–10.5)
Creat: 0.89 mg/dL (ref 0.50–1.10)
GFR, Est African American: >60 mL/min (ref >60)
Glucose, Bld: 106 mg/dL — ABNORMAL HIGH (ref 70–99)
Total Bilirubin: 0.7 mg/dL (ref 0.3–1.2)

## 2011-03-28 NOTE — Telephone Encounter (Signed)
Call patient: Her cholesterol is not at goal. In fact we need to increase her Crestor. A narrow I had spoken to her about actually holding her Crestor for one month to see if she felt like her neck and shoulder pain was better. Have her go ahead and do this and then call me in one month. If she doesn't notice much of a difference then we will increase her Crestor. If she does notice improvement off of the Crestor with her muscle aches and I will try changing her to livalo.

## 2011-04-02 NOTE — Telephone Encounter (Signed)
Pt informed of her recent chol lab results.  Told the pt actually the Crestor needed to be increased, but since she was holding for a mth due to neck and shoulder pain, after the mth will see if the myalgias went away, and if so to call and report either way, but will consider changing to livalo.  Pt voiced understanding. Jarvis Newcomer, LPN [facility]

## 2011-04-04 ENCOUNTER — Ambulatory Visit (INDEPENDENT_AMBULATORY_CARE_PROVIDER_SITE_OTHER): Payer: 59 | Admitting: Family Medicine

## 2011-04-04 VITALS — Temp 98.5°F | Wt 182.0 lb

## 2011-04-04 DIAGNOSIS — Z23 Encounter for immunization: Secondary | ICD-10-CM

## 2011-09-11 ENCOUNTER — Encounter: Payer: Self-pay | Admitting: Family Medicine

## 2011-09-15 ENCOUNTER — Other Ambulatory Visit: Payer: Self-pay | Admitting: Family Medicine

## 2011-09-26 ENCOUNTER — Encounter: Payer: Self-pay | Admitting: Family Medicine

## 2011-09-26 ENCOUNTER — Ambulatory Visit (INDEPENDENT_AMBULATORY_CARE_PROVIDER_SITE_OTHER): Payer: 59 | Admitting: Family Medicine

## 2011-09-26 VITALS — BP 122/76 | HR 79 | Wt 178.0 lb

## 2011-09-26 DIAGNOSIS — I10 Essential (primary) hypertension: Secondary | ICD-10-CM

## 2011-09-26 DIAGNOSIS — K7689 Other specified diseases of liver: Secondary | ICD-10-CM

## 2011-09-26 DIAGNOSIS — E785 Hyperlipidemia, unspecified: Secondary | ICD-10-CM

## 2011-09-26 DIAGNOSIS — M62838 Other muscle spasm: Secondary | ICD-10-CM

## 2011-09-26 MED ORDER — OLMESARTAN MEDOXOMIL-HCTZ 20-12.5 MG PO TABS: 1.0000 | ORAL_TABLET | Freq: Every day | ORAL | Status: DC

## 2011-09-26 MED ORDER — MOMETASONE FUROATE 50 MCG/ACT NA SUSP: 2.0000 | Freq: Every day | NASAL | Status: DC

## 2011-09-26 MED ORDER — PITAVASTATIN CALCIUM 2 MG PO TABS: 2.0000 mg | ORAL_TABLET | Freq: Every day | ORAL | Status: DC

## 2011-09-26 NOTE — Progress Notes (Signed)
  Subjective:    Patient ID: Dawn [name], female    DOB: 1951/06/18, 61 y.o.   MRN: [id number]  Hypertension This is a chronic problem. The current episode started more than 1 year ago. The problem is controlled. Pertinent negatives include no chest pain, peripheral edema or shortness of breath. There are no associated agents to hypertension. Past treatments include angiotensin blockers and diuretics. There are no compliance problems.    Lipids - Seh stopped the crestor bc of upper bodyaches. She has been walking 3 miles a day.   Review of Systems  Respiratory: Negative for shortness of breath.   Cardiovascular: Negative for chest pain.       Objective:   Physical Exam  Constitutional: She is oriented to person, place, and time. She appears well-developed and well-nourished.  HENT:  Head: Normocephalic and atraumatic.  Neck: Neck supple. No thyromegaly present.  Cardiovascular: Normal rate, regular rhythm and normal heart sounds.   Pulmonary/Chest: Effort normal and breath sounds normal.  Musculoskeletal: She exhibits no edema.  Lymphadenopathy:    She has no cervical adenopathy.  Neurological: She is alert and oriented to person, place, and time.  Skin: Skin is dry.  Psychiatric: She has a normal mood and affect. Her behavior is normal.          Assessment & Plan:  HTN - Well controlled. At goal. Will refill meds. F/u in 6 months. Due for labs.   Fatty liver disease - Elevated liver enzymes- Recheck enzymes.   Hyperlipidemia - Will start livalo since had myalgias with crestor.   AR - needs refill on nasonex Well controlled.   Wants letter to cover her twice a month massage for her shouldr pain and trapezious muscles spasms and pain.

## 2011-09-26 NOTE — Patient Instructions (Signed)

## 2011-10-25 LAB — COMPLETE METABOLIC PANEL WITH GFR
ALT: 44 U/L — ABNORMAL HIGH (ref 0–35)
Alkaline Phosphatase: 38 U/L — ABNORMAL LOW (ref 39–117)
BUN: 12 mg/dL (ref 6–23)
CO2: 27 mEq/L (ref 19–32)
Creat: 0.85 mg/dL (ref 0.50–1.10)
GFR, Est African American: 87 mL/min
GFR, Est Non African American: 75 mL/min
Glucose, Bld: 102 mg/dL — ABNORMAL HIGH (ref 70–99)
Potassium: 4 mEq/L (ref 3.5–5.3)
Sodium: 142 mEq/L (ref 135–145)
Total Bilirubin: 0.7 mg/dL (ref 0.3–1.2)

## 2011-10-25 LAB — LIPID PANEL
HDL: 52 mg/dL (ref >39)
LDL Cholesterol: 115 mg/dL — ABNORMAL HIGH (ref 0–99)

## 2011-10-31 ENCOUNTER — Telehealth: Payer: Self-pay | Admitting: *Deleted

## 2011-10-31 NOTE — Telephone Encounter (Signed)
Pt states that you had her Livalo 2mg  for 3 weeks before her labs were done. States if you want her to still take the Livalo to please send an rx to her pharmacy.

## 2011-11-01 MED ORDER — PITAVASTATIN CALCIUM 2 MG PO TABS: 2.0000 mg | ORAL_TABLET | Freq: Every day | ORAL | Status: DC

## 2011-11-01 NOTE — Telephone Encounter (Signed)
Rx sent. Make sure she has a coupon card.

## 2011-11-03 NOTE — Telephone Encounter (Signed)
LM for pt to return call if she doesn't have coupon for Livalo.

## 2012-02-10 ENCOUNTER — Other Ambulatory Visit: Payer: Self-pay | Admitting: *Deleted

## 2012-02-10 MED ORDER — OLMESARTAN MEDOXOMIL-HCTZ 20-12.5 MG PO TABS: 1.0000 | ORAL_TABLET | Freq: Every day | ORAL | Status: DC

## 2012-02-26 ENCOUNTER — Encounter: Payer: Self-pay | Admitting: Family Medicine

## 2012-02-26 ENCOUNTER — Ambulatory Visit (INDEPENDENT_AMBULATORY_CARE_PROVIDER_SITE_OTHER): Payer: 59 | Admitting: Family Medicine

## 2012-02-26 VITALS — BP 124/77 | HR 76 | Temp 98.4°F | Ht 63.0 in | Wt 179.0 lb

## 2012-02-26 DIAGNOSIS — M79644 Pain in right finger(s): Secondary | ICD-10-CM

## 2012-02-26 DIAGNOSIS — M79609 Pain in unspecified limb: Secondary | ICD-10-CM

## 2012-02-26 DIAGNOSIS — J209 Acute bronchitis, unspecified: Secondary | ICD-10-CM

## 2012-02-26 DIAGNOSIS — H9319 Tinnitus, unspecified ear: Secondary | ICD-10-CM

## 2012-02-26 MED ORDER — AZITHROMYCIN 250 MG PO TABS: ORAL_TABLET | ORAL | Status: DC

## 2012-02-26 MED ORDER — HYDROCOD POLST-CHLORPHEN POLST 10-8 MG/5ML PO LQCR: 5.0000 mL | Freq: Two times a day (BID) | ORAL | Status: DC | PRN

## 2012-02-26 NOTE — Patient Instructions (Signed)
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
## 2012-03-04 ENCOUNTER — Encounter: Payer: Self-pay | Admitting: *Deleted

## 2012-03-26 ENCOUNTER — Encounter: Payer: Self-pay | Admitting: Family Medicine

## 2012-03-26 ENCOUNTER — Ambulatory Visit (INDEPENDENT_AMBULATORY_CARE_PROVIDER_SITE_OTHER): Payer: 59 | Admitting: Family Medicine

## 2012-03-26 VITALS — BP 142/85 | HR 98 | Ht 63.0 in | Wt 178.0 lb

## 2012-03-26 DIAGNOSIS — I10 Essential (primary) hypertension: Secondary | ICD-10-CM

## 2012-03-26 DIAGNOSIS — E785 Hyperlipidemia, unspecified: Secondary | ICD-10-CM

## 2012-03-26 DIAGNOSIS — M549 Dorsalgia, unspecified: Secondary | ICD-10-CM

## 2012-03-26 DIAGNOSIS — M546 Pain in thoracic spine: Secondary | ICD-10-CM

## 2012-03-26 NOTE — Progress Notes (Signed)
  Subjective:    Patient ID: Dawn [name], female    DOB: [birth year], 61 y.o.   MRN: [id number]  HPI HTN - No CP or SOB.  Taking meds regularly.  Had really stressful days yesterday. No NSAIDs. No excessive dalt.  Has been  Working 10 hour days.   Upper back pain - Has been getting massage. Has been walking some for exercise.  Does sit at a computer all day. Still has a lot of tension in the muscles. No numbness or tingling in the arms.    Review of Systems     Objective:   Physical Exam  Constitutional: She is oriented to person, place, and time. She appears well-developed and well-nourished.  HENT:  Head: Normocephalic and atraumatic.  Cardiovascular: Normal rate, regular rhythm and normal heart sounds.   Pulmonary/Chest: Effort normal and breath sounds normal.  Neurological: She is alert and oriented to person, place, and time.  Skin: Skin is warm and dry.  Psychiatric: She has a normal mood and affect. Her behavior is normal.          Assessment & Plan:  HTN - Recheck in 2 months. Well controlled at work/home.  Start walking more regularly. We will continue current regimen.  Upper back pain-reminded her to make sure that she has a computer set in a very ergonomic position. Make sure getting up and stretching and walking around very frequently and not sitting for prolonged periods. Also encouraged to continue with regular massages as this can be very helpful. He's her heating pad as she gets home especially if she has a lot of tension in her neck and upper back. We could also consider formal physical therapy if she would like.  Hyperlipidemia-she's doing well on the Livalo. The side effects or problems. We are due to recheck her levels. Hopefully her LDL will be under 100 this time. Encourage her to continue her walking program.

## 2012-03-26 NOTE — Patient Instructions (Addendum)
Got for fasting labs to recheck cholesterol in a few weeks.

## 2012-05-04 LAB — LIPID PANEL
Cholesterol: 202 mg/dL — ABNORMAL HIGH (ref 0–200)
HDL: 44 mg/dL (ref >39)
Total CHOL/HDL Ratio: 4.6 Ratio
VLDL: 35 mg/dL (ref 0–40)

## 2012-05-04 LAB — BASIC METABOLIC PANEL WITH GFR
BUN: 12 mg/dL (ref 6–23)
Calcium: 9.3 mg/dL (ref 8.4–10.5)
Creat: 0.87 mg/dL (ref 0.50–1.10)
GFR, Est African American: 84 mL/min
GFR, Est Non African American: 73 mL/min
Glucose, Bld: 99 mg/dL (ref 70–99)

## 2012-05-05 ENCOUNTER — Telehealth: Payer: Self-pay | Admitting: *Deleted

## 2012-05-05 MED ORDER — PITAVASTATIN CALCIUM 2 MG PO TABS: 3.0000 mg | ORAL_TABLET | Freq: Every day | ORAL | Status: DC

## 2012-05-05 MED ORDER — MECLIZINE HCL 50 MG PO TABS: 25.0000 mg | ORAL_TABLET | Freq: Three times a day (TID) | ORAL | Status: DC | PRN

## 2012-05-05 NOTE — Telephone Encounter (Signed)
Ok rx sent.

## 2012-05-05 NOTE — Telephone Encounter (Signed)
I have sent a new rx for the Livalo for pt but she is asking if you will send an rx for mezaline for her vertigo. States she has gotten it previously from the ENT and uses it PRN but she is out. Please advise.

## 2012-05-27 ENCOUNTER — Ambulatory Visit (INDEPENDENT_AMBULATORY_CARE_PROVIDER_SITE_OTHER): Payer: 59 | Admitting: Family Medicine

## 2012-05-27 VITALS — BP 122/78 | HR 58

## 2012-05-27 DIAGNOSIS — I10 Essential (primary) hypertension: Secondary | ICD-10-CM

## 2012-05-27 MED ORDER — OLMESARTAN MEDOXOMIL-HCTZ 20-12.5 MG PO TABS: 1.0000 | ORAL_TABLET | Freq: Every day | ORAL | Status: DC

## 2012-05-27 NOTE — Progress Notes (Signed)
  Subjective:    Patient ID: Dawn [name], female    DOB: 12/01/1950, 61 y.o.   MRN: [id number]  HPI    Pt denies chest pain, SOB, dizziness, or heart palpitations.  Taking meds as directed w/o problems.  Denies medication side effects.  5 min spent with pt..  Review of Systems     Objective:   Physical Exam        Assessment & Plan:

## 2012-10-07 ENCOUNTER — Telehealth: Payer: Self-pay | Admitting: *Deleted

## 2012-10-07 NOTE — Telephone Encounter (Signed)
Pt calls and states  That she needs a new letter to have therapeutic massage again- has to have a new one every year. Would like that to be mailed to her

## 2012-10-08 ENCOUNTER — Telehealth: Payer: Self-pay | Admitting: Family Medicine

## 2012-10-08 NOTE — Telephone Encounter (Signed)
Thanks. Monday will be fine we need to mail it to her

## 2012-10-08 NOTE — Telephone Encounter (Signed)
I be happy to provide a letter. She may need to wait until Monday to pick up.

## 2012-10-08 NOTE — Telephone Encounter (Signed)
Pt called requesting a new lettter for her [employer] card that will allow her to get therapautic massages. She states you gave her one last year, but she has to get a new one yearly. Thanks,Jennifer

## 2012-10-10 ENCOUNTER — Encounter: Payer: Self-pay | Admitting: Family Medicine

## 2012-10-10 NOTE — Telephone Encounter (Signed)
Letter done. Please print and mail to her. Thank you.

## 2012-10-12 NOTE — Telephone Encounter (Signed)
Letter mailed

## 2012-11-09 ENCOUNTER — Encounter: Payer: Self-pay | Admitting: Family Medicine

## 2012-11-09 ENCOUNTER — Ambulatory Visit (INDEPENDENT_AMBULATORY_CARE_PROVIDER_SITE_OTHER): Payer: 59 | Admitting: Family Medicine

## 2012-11-09 VITALS — BP 122/79 | HR 80 | Wt 183.0 lb

## 2012-11-09 DIAGNOSIS — J019 Acute sinusitis, unspecified: Secondary | ICD-10-CM

## 2012-11-09 MED ORDER — MOMETASONE FUROATE 50 MCG/ACT NA SUSP: 2.0000 | Freq: Every day | NASAL | Status: DC

## 2012-11-09 NOTE — Progress Notes (Signed)
  Subjective:    Patient ID: Dawn [name], female    DOB: May 16, 1951, 62 y.o.   MRN: [id number]  HPI Nasal congestion - not feeling well x 4 days has used mucinex and advil not helping. non productive cough and headache. Feels the advil is bothering he stomach. Had a ST initally.  Some low grade temps over the weekend.  No GI sxs but stools are a litte more loose.  cough sounds really deep.  No hx of recent bronchits.  No cough syrup.  Cough sounds more croupy and wet.  No wheezing.  No SOB.     Review of Systems     Objective:   Physical Exam  Constitutional: She is oriented to person, place, and time. She appears well-developed and well-nourished.  HENT:  Head: Normocephalic and atraumatic.  Right Ear: External ear normal.  Left Ear: External ear normal.  Nose: Nose normal.  Mouth/Throat: Oropharynx is clear and moist.  TMs and canals are clear.   Eyes: Conjunctivae and EOM are normal. Pupils are equal, round, and reactive to light.  Neck: Neck supple. No thyromegaly present.  Cardiovascular: Normal rate, regular rhythm and normal heart sounds.   Pulmonary/Chest: Effort normal and breath sounds normal. She has no wheezes.  Lymphadenopathy:    She has no cervical adenopathy.  Neurological: She is alert and oriented to person, place, and time.  Skin: Skin is warm and dry.  Psychiatric: She has a normal mood and affect.          Assessment & Plan:  Sinusits - Likely viral. Call if not better in 3-4 days.  Continue symptomatic care. I think she can use the Delsym for cough and the Mucinex for the congestion. Avoid decongestants because of her blood pressure. Also recommend stop the Advil if it's irritating her stomach. May need to use Tylenol for a fever and pain reliever. Work note provided.

## 2012-11-26 ENCOUNTER — Ambulatory Visit (INDEPENDENT_AMBULATORY_CARE_PROVIDER_SITE_OTHER): Payer: 59 | Admitting: Family Medicine

## 2012-11-26 ENCOUNTER — Encounter: Payer: Self-pay | Admitting: Family Medicine

## 2012-11-26 VITALS — BP 130/76 | HR 88

## 2012-11-26 DIAGNOSIS — R5381 Other malaise: Secondary | ICD-10-CM

## 2012-11-26 DIAGNOSIS — Z0000 Encounter for general adult medical examination without abnormal findings: Secondary | ICD-10-CM

## 2012-11-26 DIAGNOSIS — R5383 Other fatigue: Secondary | ICD-10-CM

## 2012-11-26 MED ORDER — AZELASTINE HCL 0.1 % NA SOLN: 1.0000 | Freq: Two times a day (BID) | NASAL | Status: DC

## 2012-11-26 NOTE — Patient Instructions (Addendum)
Keep up a regular exercise program and make sure you are eating a healthy diet Try to eat 4 servings of dairy a day, or if you are lactose intolerant take a calcium with vitamin D daily.  Your vaccines are up to date.  Check out MyFitnessPal app for your Smart Phone.

## 2012-11-26 NOTE — Progress Notes (Signed)
  Subjective:     Dawn [name] is a 62 y.o. female and is here for a comprehensive physical exam. The patient reports problems - still having alot of post nasal drip and then causes a cough. .No fever. On ARB.  Tried nasal steorid spray irregularly but not sure if helping or not.   History   Social History  . Marital Status: Married    Spouse Name: N/A    Number of Children: N/A  . Years of Education: N/A   Occupational History  .      [employer] research    Social History Main Topics  . Smoking status: Never Smoker   . Smokeless tobacco: Not on file  . Alcohol Use: No  . Drug Use: No  . Sexually Active: Not on file   Other Topics Concern  . Not on file   Social History Narrative   2-3 caffeine drinks per day. Regular exercise.  Walking 3 miles a day.    Health Maintenance  Topic Date Due  . Zostavax  01/17/2012  . Influenza Vaccine  05/16/2012  . Mammogram  03/03/2014  . Pap Smear  07/17/2015  . Tetanus/tdap  12/15/2018  . Colonoscopy  09/19/2019    The following portions of the patient's history were reviewed and updated as appropriate: allergies, current medications, past family history, past medical history, past social history, past surgical history and problem list.  Review of Systems A comprehensive review of systems was negative.   Objective:    There were no vitals taken for this visit. General appearance: alert, cooperative and appears stated age Head: Normocephalic, without obvious abnormality, atraumatic Eyes: conje clear, EOMI, PEERLA Ears: normal TM's and external ear canals both ears Nose: Nares normal. Septum midline. Mucosa normal. No drainage or sinus tenderness. Throat: lips, mucosa, and tongue normal; teeth and gums normal Neck: no adenopathy, no carotid bruit, no JVD, supple, symmetrical, trachea midline and thyroid not enlarged, symmetric, no tenderness/mass/nodules Back: symmetric, no curvature. ROM normal. No CVA tenderness. Lungs:  clear to auscultation bilaterally Heart: regular rate and rhythm, S1, S2 normal, no murmur, click, rub or gallop Abdomen: soft, non-tender; bowel sounds normal; no masses,  no organomegaly Extremities: extremities normal, atraumatic, no cyanosis or edema Pulses: 2+ and symmetric  Skin:  LE extremitites.    Assessment:    Healthy female exam.      Plan:     See After Visit Summary for Counseling Recommendations  Keep up a regular exercise program and make sure you are eating a healthy diet Try to eat 4 servings of dairy a day, or if you are lactose intolerant take a calcium with vitamin D daily.  Your vaccines are up to date.   HTN- Well controlled. F/U oin 6 monts well controlled. Check TSH, CMP, and lipids  She would like ot be tested for diabetes. A1C added to labs  Weight gain - Discussed referral to nutritionist.  Encouraged her to keep food diary and take with her to the appointment.  Also encouraged more regular exercise  And discussed phone app called MyfitnessPal.   Post nasal drip - Will try nasal antihistamine.  If too drying then let me know. Lungs are clear today.

## 2012-12-10 ENCOUNTER — Other Ambulatory Visit: Payer: Self-pay | Admitting: Family Medicine

## 2012-12-17 LAB — CBC WITH DIFFERENTIAL/PLATELET
Basophils Absolute: 0 10*3/uL (ref 0.0–0.1)
Basophils Relative: 1 % (ref 0–1)
Eosinophils Absolute: 0.1 10*3/uL (ref 0.0–0.7)
MCH: 32.2 pg (ref 26.0–34.0)
MCHC: 35.1 g/dL (ref 30.0–36.0)
Monocytes Relative: 9 % (ref 3–12)
Neutro Abs: 2.1 10*3/uL (ref 1.7–7.7)
Neutrophils Relative %: 40 % — ABNORMAL LOW (ref 43–77)
Platelets: 245 10*3/uL (ref 150–400)
RDW: 13.5 % (ref 11.5–15.5)

## 2012-12-17 LAB — COMPLETE METABOLIC PANEL WITH GFR
ALT: 100 U/L — ABNORMAL HIGH (ref 0–35)
Alkaline Phosphatase: 40 U/L (ref 39–117)
Sodium: 139 mEq/L (ref 135–145)
Total Bilirubin: 0.7 mg/dL (ref 0.3–1.2)
Total Protein: 6.9 g/dL (ref 6.0–8.3)

## 2012-12-17 LAB — LIPID PANEL
HDL: 51 mg/dL (ref >39)
LDL Cholesterol: 190 mg/dL — ABNORMAL HIGH (ref 0–99)
Total CHOL/HDL Ratio: 5.2 Ratio

## 2012-12-17 LAB — TSH: TSH: 2.45 u[IU]/mL (ref 0.350–4.500)

## 2012-12-17 LAB — HEMOGLOBIN A1C: Mean Plasma Glucose: 117 mg/dL — ABNORMAL HIGH (ref <117)

## 2012-12-20 ENCOUNTER — Other Ambulatory Visit: Payer: Self-pay | Admitting: Family Medicine

## 2012-12-20 DIAGNOSIS — R748 Abnormal levels of other serum enzymes: Secondary | ICD-10-CM

## 2012-12-22 ENCOUNTER — Encounter: Payer: 59 | Attending: Family Medicine | Admitting: *Deleted

## 2012-12-22 ENCOUNTER — Encounter: Payer: Self-pay | Admitting: *Deleted

## 2012-12-22 VITALS — Ht 63.0 in | Wt 180.1 lb

## 2012-12-22 DIAGNOSIS — Z713 Dietary counseling and surveillance: Secondary | ICD-10-CM | POA: Insufficient documentation

## 2012-12-22 DIAGNOSIS — E669 Obesity, unspecified: Secondary | ICD-10-CM | POA: Insufficient documentation

## 2012-12-22 DIAGNOSIS — E785 Hyperlipidemia, unspecified: Secondary | ICD-10-CM | POA: Insufficient documentation

## 2012-12-22 DIAGNOSIS — K7689 Other specified diseases of liver: Secondary | ICD-10-CM | POA: Insufficient documentation

## 2012-12-22 NOTE — Patient Instructions (Signed)
Goals:  1. 0.5-1 pound weight loss per week with a goal weight of 150-160 pounds.  2. Limit saturated fats and refined sugars. Choose healthier fats (olive/canola oil, fish, nuts, avocado) more frequently.  3. Balance meals with protein, complex carbs, and healthy fats.  4. Work up to moderate exercise at least 30 minutes daily, 5 days a week.

## 2012-12-22 NOTE — Progress Notes (Signed)
Medical Nutrition Therapy:  Appt start time: 1415 end time:  1515.  Assessment:  Primary concern today: Obesity, hyperlipidemia, fatty liver disease. Patient reports that she has gained about 5 pounds over the last year and struggles to lose weight. She also has high cholesterol, and recent diagnosis of fatty liver. Her goal is to lose about 30 pounds. She has done some research on nutrition, but would like additional guidance. She has started to exercise more, and states that she likes to exercise. She does admit to some emotional eating, particularly sweets, but regular intake is generally healthy. Total cholesterol 267, TG 131, LDL 190. HgbA1c 5.7.  WEIGHT: 180.1 pounds BMI: 32  MEDICATIONS: Benicar, aspirin, multivitamin, vitamin C, Livalo   DIETARY INTAKE:   Usual eating pattern includes 3 meals and 2 snacks per day.  24-hr recall:  B (5:30 AM): Organic Kashi cereal with milk, banana/raisins, juice, hot green tea  Snk (10 AM): Sweet and salty nut bar  L ( PM): Cafeteria or leftovers, [place] sandwich on wheat bread, [place] yogurt, sweet tea (1/2 unsweet) Snk ( PM): Cheese crackers D ( PM): Soups, pork chops with potatoes and vegetable, water Snk ( PM): None Beverages: Green tea, sweet tea, water  Usual physical activity: Walking 2-3 miles 3 days weekly  Estimated energy needs: 1200 calories 150 g carbohydrates 75 g protein 33 g fat  Progress Towards Goal(s):  In progress.   Nutritional Diagnosis:  [place]-1.1 Food and nutrition-related knowledge deficit As related to heart healthy eating/wt loss.  As evidenced by patient report.    Intervention:  Nutrition counseling. We discussed strategies for weight loss, including balancing nutrients (carbs, protein, fat), portion control, healthy snacks, and exercise. We also discussed eating a heart healthy diet, and nutrition concern for fatty liver disease.   Goals:  1. 0.5-1 pound weight loss per week with a goal weight of 150-160 pounds.   2. Limit saturated fats and refined sugars. Choose healthier fats (olive/canola oil, fish, nuts, avocado) more frequently.  3. Balance meals with protein, complex carbs, and healthy fats.  4. Work up to moderate exercise at least 30 minutes daily, 5 days a week.   Handouts given during visit include:  1200 calorie, 5 day meal plan  Heart Healthy Eating handout  Yellow portion card  Monitoring/Evaluation:  Dietary intake, exercise, blood lipids, and body weight in 1 month(s).

## 2012-12-28 ENCOUNTER — Ambulatory Visit (HOSPITAL_BASED_OUTPATIENT_CLINIC_OR_DEPARTMENT_OTHER)
Admission: RE | Admit: 2012-12-28 | Discharge: 2012-12-28 | Disposition: A | Discharge status: Home / Self Care | Payer: 59 | Source: Ambulatory Visit | Attending: Family Medicine | Admitting: Family Medicine

## 2012-12-28 DIAGNOSIS — K589 Irritable bowel syndrome without diarrhea: Secondary | ICD-10-CM | POA: Insufficient documentation

## 2012-12-28 DIAGNOSIS — R748 Abnormal levels of other serum enzymes: Secondary | ICD-10-CM | POA: Insufficient documentation

## 2012-12-28 DIAGNOSIS — K7689 Other specified diseases of liver: Secondary | ICD-10-CM | POA: Insufficient documentation

## 2012-12-28 DIAGNOSIS — I10 Essential (primary) hypertension: Secondary | ICD-10-CM | POA: Insufficient documentation

## 2012-12-31 ENCOUNTER — Other Ambulatory Visit: Payer: 59

## 2013-02-02 ENCOUNTER — Encounter: Payer: Self-pay | Admitting: *Deleted

## 2013-02-02 ENCOUNTER — Encounter: Payer: 59 | Attending: Family Medicine | Admitting: *Deleted

## 2013-02-02 DIAGNOSIS — K7689 Other specified diseases of liver: Secondary | ICD-10-CM | POA: Insufficient documentation

## 2013-02-02 DIAGNOSIS — E669 Obesity, unspecified: Secondary | ICD-10-CM | POA: Insufficient documentation

## 2013-02-02 DIAGNOSIS — Z713 Dietary counseling and surveillance: Secondary | ICD-10-CM | POA: Insufficient documentation

## 2013-02-02 DIAGNOSIS — E785 Hyperlipidemia, unspecified: Secondary | ICD-10-CM | POA: Insufficient documentation

## 2013-02-02 NOTE — Progress Notes (Signed)
Medical Nutrition Therapy:  Appt start time: 1415 end time:  1445.  Assessment:  Patient returning today for a follow up visit for obesity. She reports that she has been working hard to follow a 1200 calorie diet for weight loss. In addition, she is walking about 5 miles almost everyday. She does report that she sometimes has trouble following a reduced calorie diet due to hunger. She has lost 3 pounds in 6 weeks, which is 0.5 pounds per week, meeting goal. Diet recall suggest that her calorie intake is closer to 1600 calories per day.  She has limited intake of sweets.   WEIGHT: 177 pounds BMI: 31.4  MEDICATIONS: Benicar, multivitamin, pitavastatin calcium, vitamin C   DIETARY INTAKE:   Usual eating pattern includes 3 meals and 3 snacks per day.  24-hr recall:  B ( AM): 1/2 cup oatmeal with walnuts, raisin, milk, 4 oz cranberry juice, hot tea OR fried egg, raisin muffin with cheese  Snk ( AM): Granola bar (140 calories)  L ( PM): 6 inch Subway sub, OR leftovers OR [place] and cheese sandwich, [place] yogurt, sweet tea (1/2 sweet) Snk ( PM): Cheese crackers (low fat) D ( PM): Salmon, broccoli, other vegetable Snk ( PM): Dark chocolate peppermint patty, vanilla wafer with peanut butter Beverages: Hot tea, cranberry juice, sweet tea, water  Usual physical activity: Walking 5 miles 5-7 days weekly  Estimated energy needs: 1200 calories (for 1 pound weight loss per week, 1600 kcal to maintain weight) 150 g carbohydrates 75 g protein 33 g fat  Progress Towards Goal(s):  Some progress.   Nutritional Diagnosis:  [place]-3.3 Overweight/obesity As related to history of excessive energy intake.  As evidenced by BMI31.4.    Intervention:  Nutrition counsling. We reinforced strategies for weight loss as discussed in initial visit. We also discussed ways to cut calories from diet without feeling hunger.   Goals:  1. Continue 0.5-1 pound weight loss per week. Goal weight: 150-160 pounds 2. Continue to  eat a heart healthy diet, balancing macronutrients at meals.  3. Reduce intake of sweet tea. Mix sweet tea with at least 1/2 glass of unsweetened tea. Drink water instead.  4. Replace afternoon snack with healthier choice (Greek yogurt, fruit).  5. Continue walking 5 miles a day at least 5 days weekly.    Monitoring/Evaluation:  Dietary intake, exercise, and body weight prn.

## 2013-03-22 ENCOUNTER — Encounter: Payer: Self-pay | Admitting: Family Medicine

## 2013-05-11 ENCOUNTER — Other Ambulatory Visit: Payer: Self-pay | Admitting: *Deleted

## 2013-05-11 MED ORDER — PITAVASTATIN CALCIUM 2 MG PO TABS: 3.0000 mg | ORAL_TABLET | Freq: Every day | ORAL | Status: DC

## 2013-06-03 ENCOUNTER — Encounter: Payer: Self-pay | Admitting: Family Medicine

## 2013-06-03 ENCOUNTER — Ambulatory Visit (INDEPENDENT_AMBULATORY_CARE_PROVIDER_SITE_OTHER): Payer: 59 | Admitting: Family Medicine

## 2013-06-03 VITALS — BP 111/73 | HR 72 | Wt 176.0 lb

## 2013-06-03 DIAGNOSIS — H8110 Benign paroxysmal vertigo, unspecified ear: Secondary | ICD-10-CM

## 2013-06-03 DIAGNOSIS — H8109 Meniere's disease, unspecified ear: Secondary | ICD-10-CM

## 2013-06-03 DIAGNOSIS — E785 Hyperlipidemia, unspecified: Secondary | ICD-10-CM

## 2013-06-03 DIAGNOSIS — R7301 Impaired fasting glucose: Secondary | ICD-10-CM

## 2013-06-03 DIAGNOSIS — Z23 Encounter for immunization: Secondary | ICD-10-CM

## 2013-06-03 DIAGNOSIS — I10 Essential (primary) hypertension: Secondary | ICD-10-CM

## 2013-06-03 DIAGNOSIS — R748 Abnormal levels of other serum enzymes: Secondary | ICD-10-CM

## 2013-06-03 LAB — HEMOGLOBIN A1C
Hgb A1c MFr Bld: 5.6 % (ref <5.7)
Mean Plasma Glucose: 114 mg/dL (ref <117)

## 2013-06-03 LAB — HEPATIC FUNCTION PANEL
AST: 24 U/L (ref 0–37)
Albumin: 4.3 g/dL (ref 3.5–5.2)
Bilirubin, Direct: 0.1 mg/dL (ref 0.0–0.3)
Total Bilirubin: 0.8 mg/dL (ref 0.3–1.2)

## 2013-06-03 LAB — LIPID PANEL
LDL Cholesterol: 122 mg/dL — ABNORMAL HIGH (ref 0–99)
Triglycerides: 156 mg/dL — ABNORMAL HIGH (ref <150)

## 2013-06-03 MED ORDER — MECLIZINE HCL 50 MG PO TABS: 25.0000 mg | ORAL_TABLET | Freq: Three times a day (TID) | ORAL | Status: DC | PRN

## 2013-06-03 NOTE — Progress Notes (Signed)
  Subjective:    Patient ID: Dawn [name], female    DOB: 09/30/1950, 62 y.o.   MRN: [id number]  HPI HTN -  Pt denies chest pain, SOB, dizziness, or heart palpitations.  Taking meds as directed w/o problems.  Denies medication side effects.  .  Had another episode of vertigo. Realized her meclizine was expired.  Did her epply manuvers and is feeling better today. She would like a refill on her prescription to have as needed..   Fatty liver - wants to go over results of her last liver tests and ultrasound. Everything was consistent with fatty liver. She says she's been tried to eat better and get some regular exercise..    Review of Systems     Objective:   Physical Exam  Constitutional: She is oriented to person, place, and time. She appears well-developed and well-nourished.  HENT:  Head: Normocephalic and atraumatic.  Neck: Neck supple. No thyromegaly present.  Cardiovascular: Normal rate, regular rhythm and normal heart sounds.   No carotid bruits  Pulmonary/Chest: Effort normal and breath sounds normal.  Musculoskeletal: She exhibits no edema.  Lymphadenopathy:    She has no cervical adenopathy.  Neurological: She is alert and oriented to person, place, and time.  Skin: Skin is warm and dry.  Psychiatric: She has a normal mood and affect. Her behavior is normal.          Assessment & Plan:  HTN- well controlled. Continue current regimen. Followup in 6 months. She has lost 4 pounds since April which is fantastic.  Fatyt liver - discussed the importance of low-fat and low-cholesterol diet and regular exercise and weight loss to improve fatty liver. I explained to her that if she continues to work on this at the inflammation her liver should decrease in enzymes it should go down.  Vertigo - will refill her meclizine. Continue to do the maneuvers when the vertigo hits.  IFG- borderline impaired fasting glucose. Due to repeat A1c today. Hopefully it looks even better since  she's been working on her diet and exercise. Lab Results  Component Value Date   HGBA1C 5.7* 12/17/2012   Hyperlipidemia-she did stop her statin back in March and April. We had repeated her liver enzymes and lipids at that time to see how she was doing. Her cholesterol is out of control and her liver enzymes are actually even higher. To me that confirmed that the statin was not causing the liver elevation and that is her fatty liver. She's been back on her statin and has been tolerating it well. We will repeat her cholesterol levels today.

## 2013-09-16 ENCOUNTER — Other Ambulatory Visit: Payer: Self-pay | Admitting: Family Medicine

## 2013-09-20 ENCOUNTER — Ambulatory Visit: Payer: Self-pay | Admitting: Family Medicine

## 2013-11-08 ENCOUNTER — Ambulatory Visit (INDEPENDENT_AMBULATORY_CARE_PROVIDER_SITE_OTHER): Payer: Self-pay | Admitting: Family Medicine

## 2013-11-08 DIAGNOSIS — Z713 Dietary counseling and surveillance: Secondary | ICD-10-CM

## 2013-12-02 ENCOUNTER — Ambulatory Visit (INDEPENDENT_AMBULATORY_CARE_PROVIDER_SITE_OTHER): Payer: 59 | Admitting: Family Medicine

## 2013-12-02 ENCOUNTER — Encounter: Payer: Self-pay | Admitting: Family Medicine

## 2013-12-02 VITALS — BP 113/70 | HR 78 | Wt 173.0 lb

## 2013-12-02 DIAGNOSIS — I10 Essential (primary) hypertension: Secondary | ICD-10-CM

## 2013-12-02 DIAGNOSIS — R7301 Impaired fasting glucose: Secondary | ICD-10-CM

## 2013-12-02 DIAGNOSIS — H8109 Meniere's disease, unspecified ear: Secondary | ICD-10-CM

## 2013-12-02 DIAGNOSIS — Z6830 Body mass index (BMI) 30.0-30.9, adult: Secondary | ICD-10-CM

## 2013-12-02 LAB — POCT GLYCOSYLATED HEMOGLOBIN (HGB A1C): Hemoglobin A1C: 5.4

## 2013-12-02 MED ORDER — PREDNISONE 20 MG PO TABS: 40.0000 mg | ORAL_TABLET | Freq: Every day | ORAL | Status: DC

## 2013-12-02 NOTE — Progress Notes (Signed)
   Subjective:    Patient ID: Dawn [name], female    DOB: 29-Nov-1950, 63 y.o.   MRN: [id number]  HPI Hypertension- Pt denies chest pain, SOB, dizziness, or heart palpitations.  Taking meds as directed w/o problems.  Denies medication side effects.    Has had some vertigo recently hand hasn't had it in awhile.  Did take her meclizine but does make her drowsy. Has some pressure ein her right ear with some ringing. No fever or drainage form ear. No recent URI.    IFG - last a1c is 5.7 . Has been working on weight loss.  Has been going to nutrition classes.  Review of Systems     Objective:   Physical Exam  Constitutional: She is oriented to person, place, and time. She appears well-developed and well-nourished.  HENT:  Head: Normocephalic and atraumatic.  Right Ear: External ear normal.  Left Ear: External ear normal.  Nose: Nose normal.  Mouth/Throat: Oropharynx is clear and moist.  TMs and canals are clear.   Eyes: Conjunctivae and EOM are normal. Pupils are equal, round, and reactive to light.  Neck: Neck supple. No thyromegaly present.  Cardiovascular: Normal rate, regular rhythm and normal heart sounds.   Pulmonary/Chest: Effort normal and breath sounds normal. She has no wheezes.  Lymphadenopathy:    She has no cervical adenopathy.  Neurological: She is alert and oriented to person, place, and time.  Skin: Skin is warm and dry.  Psychiatric: She has a normal mood and affect. Her behavior is normal.          Assessment & Plan:  HTN- well controlled.  f/U in 6 months. . tinea cruris.   Vertigo/Meniere's disease. - Exam is normal of the ear.  Will treat with 5 days of steorid and low salt diet.    IFG - down to 5.4. Recheck in 6 months. Continue to work on exercise and diet. She's done a great job. Also recommended the Smart phone application called my fitness PAL. She says she had used it previously and it recommended 1200 calories a day. I think she may have set her  Cheryll Cockayne too high. She had set to lose 2 pounds in a week. I encouraged her to set it for half a pound or 1 pound per week so this allows her more calories. Also she works out it will allow her to eat more calories as well.

## 2013-12-15 ENCOUNTER — Other Ambulatory Visit: Payer: Self-pay | Admitting: Family Medicine

## 2014-06-02 ENCOUNTER — Ambulatory Visit (INDEPENDENT_AMBULATORY_CARE_PROVIDER_SITE_OTHER): Payer: 59 | Admitting: Family Medicine

## 2014-06-02 ENCOUNTER — Encounter: Payer: Self-pay | Admitting: Family Medicine

## 2014-06-02 VITALS — BP 133/81 | HR 75 | Ht 63.0 in | Wt 178.0 lb

## 2014-06-02 DIAGNOSIS — R7301 Impaired fasting glucose: Secondary | ICD-10-CM

## 2014-06-02 DIAGNOSIS — E785 Hyperlipidemia, unspecified: Secondary | ICD-10-CM

## 2014-06-02 DIAGNOSIS — Z23 Encounter for immunization: Secondary | ICD-10-CM

## 2014-06-02 DIAGNOSIS — I10 Essential (primary) hypertension: Secondary | ICD-10-CM

## 2014-06-02 LAB — POCT GLYCOSYLATED HEMOGLOBIN (HGB A1C): Hemoglobin A1C: 5.6

## 2014-06-02 MED ORDER — MECLIZINE HCL 25 MG PO TABS: 25.0000 mg | ORAL_TABLET | Freq: Three times a day (TID) | ORAL | Status: DC | PRN

## 2014-06-02 MED ORDER — PITAVASTATIN CALCIUM 2 MG PO TABS: ORAL_TABLET | ORAL | Status: DC

## 2014-06-02 MED ORDER — MOMETASONE FUROATE 50 MCG/ACT NA SUSP: 1.0000 | NASAL | Status: DC | PRN

## 2014-06-02 MED ORDER — BENICAR HCT 20-12.5 MG PO TABS: ORAL_TABLET | ORAL | Status: DC

## 2014-06-02 MED ORDER — FLUTICASONE PROPIONATE 50 MCG/ACT NA SUSP: 1.0000 | Freq: Every day | NASAL | Status: DC

## 2014-06-02 NOTE — Assessment & Plan Note (Signed)
Doing well on Livalo. Due to repeat CMP and lipid panel. Lab slip provided today. Continue current regimen. Refill sent for one year.

## 2014-06-02 NOTE — Assessment & Plan Note (Signed)
Lab Results  Component Value Date   HGBA1C 5.4 12/02/2013   Well-controlled.. Recommend repeat in 6-12 months.

## 2014-06-02 NOTE — Addendum Note (Signed)
Addended by: Doree Albee on: 06/02/2014 03:34 PM   Modules accepted: Orders

## 2014-06-02 NOTE — Progress Notes (Signed)
   Subjective:    Patient ID: Dawn [name], female    DOB: 04/06/1951, 63 y.o.   MRN: [id number]  HPI Hypertension- Pt denies chest pain, SOB, dizziness, or heart palpitations.  Taking meds as directed w/o problems.  Denies medication side effects.    IFG - no inc thirst and urination. She has gained a couple of pounds. She says she still exercising regularly but probably has been eating more than she should.  Hyperlipidemia-currently taking Livalo 2 mg. She takes one half tabs at bedtime. Tolerating it well without any side effects or myalgias. Review of Systems     Objective:   Physical Exam  Constitutional: She is oriented to person, place, and time. She appears well-developed and well-nourished.  HENT:  Head: Normocephalic and atraumatic.  Cardiovascular: Normal rate, regular rhythm and normal heart sounds.   Pulmonary/Chest: Effort normal and breath sounds normal.  Neurological: She is alert and oriented to person, place, and time.  Skin: Skin is warm and dry.  Psychiatric: She has a normal mood and affect. Her behavior is normal.          Assessment & Plan:  Flu shot given today.  Discussed need for screening mammogram.

## 2014-06-02 NOTE — Assessment & Plan Note (Signed)
Well-controlled. Continue current regimen. Followup in 6 months. She is due for CMP and fasting lipid panel.

## 2014-06-05 ENCOUNTER — Ambulatory Visit: Payer: 59 | Admitting: Family Medicine

## 2014-06-15 ENCOUNTER — Encounter: Payer: Self-pay | Admitting: Internal Medicine

## 2014-08-21 ENCOUNTER — Telehealth: Payer: Self-pay | Admitting: Family Medicine

## 2014-08-21 ENCOUNTER — Other Ambulatory Visit: Payer: Self-pay | Admitting: Family Medicine

## 2014-08-21 NOTE — Telephone Encounter (Signed)
Patient was here today having some labs done that you ordered.  She said that her GYN doctor, Dr. Corinna Capra, wanted to see if her VIT D could be checked also.  Probably due to results of bone density scan.  They took extra blood so if you could put in an order for this, it would be appreciated.  Also, she said that also her Monia Sabal is NOT going to be covered any more by her ins.  She will need you to please prescribe something else for her cholerestol.  If any questions, her # is [phone].  thanks

## 2014-08-21 NOTE — Telephone Encounter (Signed)
Tried to contact pt to see if she had this labs drawn today and her phone is not accepting any calls at this time.Dawn [name];

## 2014-08-22 LAB — LIPID PANEL
CHOL/HDL RATIO: 4.7 ratio
Cholesterol: 258 mg/dL — ABNORMAL HIGH (ref 0–200)
HDL: 55 mg/dL (ref >39)
LDL Cholesterol: 163 mg/dL — ABNORMAL HIGH (ref 0–99)
Triglycerides: 200 mg/dL — ABNORMAL HIGH (ref <150)
VLDL: 40 mg/dL (ref 0–40)

## 2014-08-22 LAB — COMPLETE METABOLIC PANEL WITH GFR
ALK PHOS: 39 U/L (ref 39–117)
ALT: 21 U/L (ref 0–35)
AST: 16 U/L (ref 0–37)
Albumin: 4.2 g/dL (ref 3.5–5.2)
BILIRUBIN TOTAL: 0.4 mg/dL (ref 0.2–1.2)
BUN: 12 mg/dL (ref 6–23)
CO2: 27 mEq/L (ref 19–32)
CREATININE: 0.87 mg/dL (ref 0.50–1.10)
Calcium: 9.2 mg/dL (ref 8.4–10.5)
Chloride: 104 mEq/L (ref 96–112)
GFR, EST NON AFRICAN AMERICAN: 72 mL/min
GFR, Est African American: 83 mL/min
GLUCOSE: 103 mg/dL — AB (ref 70–99)
Potassium: 3.7 mEq/L (ref 3.5–5.3)
SODIUM: 140 meq/L (ref 135–145)
TOTAL PROTEIN: 7.1 g/dL (ref 6.0–8.3)

## 2014-08-22 NOTE — Telephone Encounter (Signed)
[phone] spoke w/Dawn [name] asked that a vit d be added on to this lab.[name]

## 2014-08-22 NOTE — Telephone Encounter (Signed)
Let' try to call again. Ok to order Vitamin D

## 2014-08-23 LAB — VITAMIN D 25 HYDROXY (VIT D DEFICIENCY, FRACTURES): VIT D 25 HYDROXY: 25 ng/mL — AB (ref 30–100)

## 2014-09-20 ENCOUNTER — Encounter: Payer: Self-pay | Admitting: Internal Medicine

## 2014-09-29 ENCOUNTER — Ambulatory Visit (AMBULATORY_SURGERY_CENTER): Payer: Self-pay

## 2014-09-29 VITALS — Ht 63.0 in | Wt 177.6 lb

## 2014-09-29 DIAGNOSIS — Z800 Family history of malignant neoplasm of digestive organs: Secondary | ICD-10-CM

## 2014-09-29 MED ORDER — MOVIPREP 100 G PO SOLR: 1.0000 | Freq: Once | ORAL | Status: DC

## 2014-09-29 NOTE — Progress Notes (Signed)
No allergies to eggs or soy No diet/weight loss meds No past anesthesia/propofol No home oxygen  Has email  Emmi instructions given for colonoscopy

## 2014-10-26 ENCOUNTER — Encounter: Payer: Self-pay | Admitting: Internal Medicine

## 2014-10-26 ENCOUNTER — Ambulatory Visit (AMBULATORY_SURGERY_CENTER): Payer: 59 | Admitting: Internal Medicine

## 2014-10-26 VITALS — BP 130/55 | HR 60 | Temp 97.9°F | Resp 15 | Ht 63.0 in | Wt 177.0 lb

## 2014-10-26 DIAGNOSIS — Z800 Family history of malignant neoplasm of digestive organs: Secondary | ICD-10-CM

## 2014-10-26 DIAGNOSIS — Z1211 Encounter for screening for malignant neoplasm of colon: Secondary | ICD-10-CM

## 2014-10-26 MED ORDER — SODIUM CHLORIDE 0.9 % IV SOLN: 500.0000 mL | INTRAVENOUS | Status: DC

## 2014-10-26 NOTE — Patient Instructions (Signed)
YOU HAD AN ENDOSCOPIC PROCEDURE TODAY AT THE [facility]: Refer to the procedure report that was given to you for any specific questions about what was found during the examination.  If the procedure report does not answer your questions, please call your gastroenterologist to clarify.  If you requested that your care partner not be given the details of your procedure findings, then the procedure report has been included in a sealed envelope for you to review at your convenience later.  YOU SHOULD EXPECT: Some feelings of bloating in the abdomen. Passage of more gas than usual.  Walking can help get rid of the air that was put into your GI tract during the procedure and reduce the bloating. If you had a lower endoscopy (such as a colonoscopy or flexible sigmoidoscopy) you may notice spotting of blood in your stool or on the toilet paper. If you underwent a bowel prep for your procedure, then you may not have a normal bowel movement for a few days.  DIET: Your first meal following the procedure should be a light meal and then it is ok to progress to your normal diet.  A half-sandwich or bowl of soup is an example of a good first meal.  Heavy or fried foods are harder to digest and may make you feel nauseous or bloated.  Likewise meals heavy in dairy and vegetables can cause extra gas to form and this can also increase the bloating.  Drink plenty of fluids but you should avoid alcoholic beverages for 24 hours.  ACTIVITY: Your care partner should take you home directly after the procedure.  You should plan to take it easy, moving slowly for the rest of the day.  You can resume normal activity the day after the procedure however you should NOT DRIVE or use heavy machinery for 24 hours (because of the sedation medicines used during the test).    SYMPTOMS TO REPORT IMMEDIATELY: A gastroenterologist can be reached at any hour.  During normal business hours, 8:30 AM to 5:00 PM Monday through Friday,  call [phone].  After hours and on weekends, please call the GI answering service at [phone] who will take a message and have the physician on call contact you.   Following lower endoscopy (colonoscopy or flexible sigmoidoscopy):  Excessive amounts of blood in the stool  Significant tenderness or worsening of abdominal pains  Swelling of the abdomen that is new, acute  Fever of 100F or higher  FOLLOW UP: If any biopsies were taken you will be contacted by phone or by letter within the next 1-3 weeks.  Call your gastroenterologist if you have not heard about the biopsies in 3 weeks.  Our staff will call the home number listed on your records the next business day following your procedure to check on you and address any questions or concerns that you may have at that time regarding the information given to you following your procedure. This is a courtesy call and so if there is no answer at the home number and we have not heard from you through the emergency physician on call, we will assume that you have returned to your regular daily activities without incident.  SIGNATURES/CONFIDENTIALITY: You and/or your care partner have signed paperwork which will be entered into your electronic medical record.  These signatures attest to the fact that that the information above on your After Visit Summary has been reviewed and is understood.  Full responsibility of the confidentiality of this   discharge information lies with you and/or your care-partner.     Handouts were given to your care partner on diverticulosis and a high fiber diet with liberal fluid intake. You might notice some irritation in your nose or drainage.  This may cause feelings of congestion.  This is from the oxygen, which can be drying.  This is no cause for concern; this should clear up in a few days.  You may resume your current medications today. Please call if any questions or concerns.   

## 2014-10-26 NOTE — Progress Notes (Signed)
Awake with spont resp, vss, pleased with MAC. Report to RN

## 2014-10-26 NOTE — Progress Notes (Signed)
No problems noted in the recovery room. maw 

## 2014-10-26 NOTE — Op Note (Signed)
[facility]  [address]. [place], [address]   COLONOSCOPY PROCEDURE REPORT  PATIENT: Dawn, [name]  MR#: [id number] BIRTHDATE: December 12, 1950 , 56  yrs. old GENDER: female ENDOSCOPIST: Lafayette Dragon, MD REFERRED AN:VBTYOMAYO Madilyn Fireman, M.D. PROCEDURE DATE:  10/26/2014 PROCEDURE:   Colonoscopy, screening First Screening Colonoscopy - Avg.  risk and is 50 yrs.  old or older - No.  Prior Negative Screening - Now for repeat screening. N/A  History of Adenoma - Now for follow-up colonoscopy & has been > or = to 3 yrs.  N/A  Polyps Removed Today? No.  Polyps Removed Today? No.  Recommend repeat exam, <10 yrs? Polyps Removed Today? No.  Recommend repeat exam, <10 yrs? Yes.  Polyps Removed Today? No.  Recommend repeat exam, <10 yrs? Yes.  High risk (family or personal hx). ASA CLASS:   Class I INDICATIONS:positive family history of colon cancer in patient's father.  Hyperplastic polyp in 2006.  Normal colonoscopy in 2000. Last colonoscopy in January 2011 showed diverticulosis. MEDICATIONS: Monitored anesthesia care and Propofol 500 mg IV  DESCRIPTION OF PROCEDURE:   After the risks benefits and alternatives of the procedure were thoroughly explained, informed consent was obtained.  The digital rectal exam revealed no abnormalities of the rectum.   The LB [id number]  endoscope was introduced through the anus and advanced to the cecum, which was identified by both the appendix and ileocecal valve. No adverse events experienced.   The quality of the prep was good, using MoviPrep  The instrument was then slowly withdrawn as the colon was fully examined.      COLON FINDINGS: A normal appearing cecum, ileocecal valve, and appendiceal orifice were identified.  The ascending, transverse, descending, sigmoid colon, and rectum appeared unremarkable. There was mild diverticulosis noted in the sigmoid colon with associated tortuosity.  Retroflexed views revealed  no abnormalities. The time to cecum=33 minutes 05 seconds.  Withdrawal time=6 minutes 00 seconds.  The scope was withdrawn and the procedure completed. COMPLICATIONS: There were no immediate complications.  ENDOSCOPIC IMPRESSION: 1.   Normal colonoscopy 2.   There was mild diverticulosis noted in the sigmoid colon 3.long redundant hypotonic colon. Difficult exam. 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

## 2014-10-27 ENCOUNTER — Telehealth: Payer: Self-pay

## 2014-10-27 NOTE — Telephone Encounter (Signed)
  Follow up Call-  Call back number 10/26/2014  Post procedure Call Back phone  # [phone]  Permission to leave phone message Yes     Patient questions:  Do you have a fever, pain , or abdominal swelling? No. Pain Score  0 *  Have you tolerated food without any problems? Yes.    Have you been able to return to your normal activities? Yes.    Do you have any questions about your discharge instructions: Diet   No. Medications  No. Follow up visit  No.  Do you have questions or concerns about your Care? No.  Actions: * If pain score is 4 or above: No action needed, pain <4.

## 2014-12-01 ENCOUNTER — Ambulatory Visit: Payer: 59 | Admitting: Family Medicine

## 2014-12-07 ENCOUNTER — Encounter: Payer: Self-pay | Admitting: Family Medicine

## 2014-12-07 ENCOUNTER — Ambulatory Visit (INDEPENDENT_AMBULATORY_CARE_PROVIDER_SITE_OTHER): Payer: 59 | Admitting: Family Medicine

## 2014-12-07 VITALS — BP 118/72 | HR 77 | Ht 63.0 in | Wt 176.0 lb

## 2014-12-07 DIAGNOSIS — R7301 Impaired fasting glucose: Secondary | ICD-10-CM

## 2014-12-07 DIAGNOSIS — I10 Essential (primary) hypertension: Secondary | ICD-10-CM | POA: Diagnosis not present

## 2014-12-07 DIAGNOSIS — J0120 Acute ethmoidal sinusitis, unspecified: Secondary | ICD-10-CM | POA: Diagnosis not present

## 2014-12-07 DIAGNOSIS — H8110 Benign paroxysmal vertigo, unspecified ear: Secondary | ICD-10-CM | POA: Diagnosis not present

## 2014-12-07 LAB — POCT GLYCOSYLATED HEMOGLOBIN (HGB A1C): Hemoglobin A1C: 5.5

## 2014-12-07 MED ORDER — AZITHROMYCIN 250 MG PO TABS
ORAL_TABLET | ORAL | Status: DC
Start: 2014-12-07 — End: 2015-05-24

## 2014-12-07 MED ORDER — MECLIZINE HCL 25 MG PO TABS: 25.0000 mg | ORAL_TABLET | Freq: Three times a day (TID) | ORAL | Status: DC | PRN

## 2014-12-07 NOTE — Progress Notes (Signed)
   Subjective:    Patient ID: Dawn [name], female    DOB: [birth year], 64 y.o.   MRN: [id number]  HPI Hypertension- Pt denies chest pain, SOB, dizziness, or heart palpitations.  Taking meds as directed w/o problems.  Denies medication side effects.    IFG - No inc thrist or urination. Says if skips eating feels like her sugar will go low. She continues to walk about 2 miles a day. She did get off her exercise routine for a while but is getting back on track.  Says thinks may have vertigo. Hx of BPV.  Says when she bends down and then looks up will feel dizzy and off. Has been blowing blood tinged mucous from her nose for a few weeks.  Makes it hard to concentrate.  She has a lot of pressure around her right eye.  No fever, chills, or sweats.   Review of Systems     Objective:   Physical Exam  Constitutional: She is oriented to person, place, and time. She appears well-developed and well-nourished.  HENT:  Head: Normocephalic and atraumatic.  Cardiovascular: Normal rate, regular rhythm and normal heart sounds.   Pulmonary/Chest: Effort normal and breath sounds normal.  Neurological: She is alert and oriented to person, place, and time.  Felt mildly dizzy with Diks-Hallpike manuver bilatearlly. No nystagmus on exam.   Skin: Skin is warm and dry.  Psychiatric: She has a normal mood and affect. Her behavior is normal.          Assessment & Plan:  HTN - well controlled.  Continue current regimen. Follow-up in 6 months.  IFG - well controlled.  A1C is 5.5 . F/U in 6 mo.   Right ethmoidal sinusitis - will tx with azithro.  Call if not better in one week  Vertigo - given exrecise to start.

## 2015-03-12 LAB — HM DIABETES EYE EXAM

## 2015-04-11 ENCOUNTER — Encounter: Payer: Self-pay | Admitting: Family Medicine

## 2015-05-24 ENCOUNTER — Encounter: Payer: Self-pay | Admitting: Family Medicine

## 2015-05-24 ENCOUNTER — Ambulatory Visit (INDEPENDENT_AMBULATORY_CARE_PROVIDER_SITE_OTHER): Payer: 59 | Admitting: Family Medicine

## 2015-05-24 VITALS — BP 125/84 | HR 80 | Temp 97.7°F | Ht 63.0 in | Wt 177.0 lb

## 2015-05-24 DIAGNOSIS — I10 Essential (primary) hypertension: Secondary | ICD-10-CM | POA: Diagnosis not present

## 2015-05-24 DIAGNOSIS — J029 Acute pharyngitis, unspecified: Secondary | ICD-10-CM

## 2015-05-24 DIAGNOSIS — E559 Vitamin D deficiency, unspecified: Secondary | ICD-10-CM | POA: Diagnosis not present

## 2015-05-24 DIAGNOSIS — R7301 Impaired fasting glucose: Secondary | ICD-10-CM | POA: Diagnosis not present

## 2015-05-24 DIAGNOSIS — E785 Hyperlipidemia, unspecified: Secondary | ICD-10-CM | POA: Diagnosis not present

## 2015-05-24 DIAGNOSIS — M810 Age-related osteoporosis without current pathological fracture: Secondary | ICD-10-CM | POA: Insufficient documentation

## 2015-05-24 LAB — POCT GLYCOSYLATED HEMOGLOBIN (HGB A1C): Hemoglobin A1C: 5.5

## 2015-05-24 MED ORDER — PITAVASTATIN CALCIUM 2 MG PO TABS: ORAL_TABLET | ORAL | Status: DC

## 2015-05-24 MED ORDER — BENICAR HCT 20-12.5 MG PO TABS: ORAL_TABLET | ORAL | Status: DC

## 2015-05-24 NOTE — Progress Notes (Signed)
   Subjective:    Patient ID: Dawn [name], female    DOB: [birth year], 64 y.o.   MRN: [id number]  HPI Hypertension- Pt denies chest pain, SOB, dizziness, or heart palpitations.  Taking meds as directed w/o problems.  Denies medication side effects.    Hyperlipidemia-last LDL was 163 back in December. She's now on the below tolerating it well without any myalgias or side effects. Though she admits she hasn't been taking it every night. She says is just hard getting used to taking a medication at bedtime.  IFG - no inc thirst or urination.   Feels like getting  A scrathcy throat. Husband has been sick . She denies any sinus congestion or cough at least at this point. No fever.  Osteoporosis is a new diagnosis with Dr. Corinna Capra.  They haven't discussed medication yet. She is taking calcium with vitamin D.  Right now she has 1000 mg in her multivitamin as well as 1000 mg in her calcium. She just wants to make sure it's not too much. She does have a history of low vitamin D.  Review of Systems     Objective:   Physical Exam  Constitutional: She is oriented to person, place, and time. She appears well-developed and well-nourished.  HENT:  Head: Normocephalic and atraumatic.  Cardiovascular: Normal rate, regular rhythm and normal heart sounds.   No carotid bruits.   Pulmonary/Chest: Effort normal and breath sounds normal.  Musculoskeletal: She exhibits no edema.  Neurological: She is alert and oriented to person, place, and time.  Skin: Skin is warm and dry.  Psychiatric: She has a normal mood and affect. Her behavior is normal.          Assessment & Plan:  HTN- well-controlled. Continue current regimen. Follow-up in 6 months.  Hyperlipidemia-due to recheck lipids. We will get about another month's to she can be a little bit more consistent with her Livalo. Next  Mild sore throat-would be the beginning of an early viral infection. Call if symptoms worsen.  IFG - looks great today.   hemoglobin A1c is 5.5  Osteoporosis-discussed that this is typically treated with a bisphosphonate. We discussed how medications work and the pros and cons. We did not get into osteonecrosis of the jaw better. She's actually going to meet with her GYN to review this further.  Vitamin D deficiency-due to recheck vitamin D. For now okay to continue 2000 international units of vitamin D. But at some point we may need to decrease that back down to 1000. She may need to find a calcium without vitamin D.

## 2015-11-21 ENCOUNTER — Encounter: Payer: Self-pay | Admitting: Family Medicine

## 2015-11-21 ENCOUNTER — Ambulatory Visit (INDEPENDENT_AMBULATORY_CARE_PROVIDER_SITE_OTHER): Payer: 59 | Admitting: Family Medicine

## 2015-11-21 VITALS — BP 107/55 | HR 89 | Wt 183.0 lb

## 2015-11-21 DIAGNOSIS — R7301 Impaired fasting glucose: Secondary | ICD-10-CM | POA: Diagnosis not present

## 2015-11-21 DIAGNOSIS — S161XXA Strain of muscle, fascia and tendon at neck level, initial encounter: Secondary | ICD-10-CM | POA: Diagnosis not present

## 2015-11-21 DIAGNOSIS — I10 Essential (primary) hypertension: Secondary | ICD-10-CM | POA: Diagnosis not present

## 2015-11-21 DIAGNOSIS — Z114 Encounter for screening for human immunodeficiency virus [HIV]: Secondary | ICD-10-CM

## 2015-11-21 LAB — POCT GLYCOSYLATED HEMOGLOBIN (HGB A1C): HEMOGLOBIN A1C: 5.4

## 2015-11-21 MED ORDER — BENICAR HCT 20-12.5 MG PO TABS: ORAL_TABLET | ORAL | Status: DC

## 2015-11-21 MED FILL — OLMESARTAN-HCTZ 20-12.5 MG: 20-12.5 | 90 days supply | Qty: 90 | Fill #0

## 2015-11-21 NOTE — Progress Notes (Signed)
   Subjective:    Patient ID: Dawn [name], female    DOB: 23-Nov-1950, 65 y.o.   MRN: [id number]  HPI Hypertension- Pt denies chest pain, SOB, dizziness, or heart palpitations.  Taking meds as directed w/o problems.  Denies medication side effects.    IFG- No inc thirst or urination.   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 Says can take a couple days ot get he rmuscle to relax. Uses a heat pack. He also gets massage therapy couple of times a month that she did skip January.  Review of Systems     Objective:   Physical Exam  Constitutional: She is oriented to person, place, and time. She appears well-developed and well-nourished.  HENT:  Head: Normocephalic and atraumatic.  Cardiovascular: Normal rate, regular rhythm and normal heart sounds.   Pulmonary/Chest: Effort normal and breath sounds normal.  Musculoskeletal:  Normal cervical flexion, extension, rotation right and left but she did have pain with rotation to the left. Normal side bending right and left. Nontender over the cervical spine. She has very tight over both upper trapezius muscles.  Neurological: She is alert and oriented to person, place, and time.  Skin: Skin is warm and dry.  Psychiatric: She has a normal mood and affect. Her behavior is normal.        Assessment & Plan:  HTN - well controlled. Into new current regimen. Follow-up in 6 months.  IFG - stable and well controlled. A1C  Is great at 5.4  follow-up in 6 months. Next  Cervical strain-continue to work on such and exercises. Offered to put her into formal physical therapy if she would like. She will consider it. Continue to use the heating pad area she is planning on changing her work stations available but more ergonomic with one of the tests that raises and lowers. Continue to work on posture.  Had her flu shot done in October.

## 2015-11-23 DIAGNOSIS — I10 Essential (primary) hypertension: Secondary | ICD-10-CM | POA: Diagnosis not present

## 2015-11-23 DIAGNOSIS — Z114 Encounter for screening for human immunodeficiency virus [HIV]: Secondary | ICD-10-CM | POA: Diagnosis not present

## 2015-11-23 DIAGNOSIS — Z1159 Encounter for screening for other viral diseases: Secondary | ICD-10-CM | POA: Diagnosis not present

## 2015-11-24 LAB — BASIC METABOLIC PANEL
BUN: 12 mg/dL (ref 7–25)
CALCIUM: 8.9 mg/dL (ref 8.6–10.4)
CHLORIDE: 104 mmol/L (ref 98–110)
CO2: 26 mmol/L (ref 20–31)
Creat: 0.79 mg/dL (ref 0.50–0.99)
Glucose, Bld: 96 mg/dL (ref 65–99)
POTASSIUM: 4 mmol/L (ref 3.5–5.3)
SODIUM: 139 mmol/L (ref 135–146)

## 2015-11-24 LAB — LIPID PANEL
Cholesterol: 179 mg/dL (ref 125–200)
HDL: 51 mg/dL (ref >=46)
LDL Cholesterol: 105 mg/dL (ref <130)
Total CHOL/HDL Ratio: 3.5 Ratio (ref <=5.0)
Triglycerides: 116 mg/dL (ref <150)
VLDL: 23 mg/dL (ref <30)

## 2015-11-24 LAB — HIV ANTIBODY (ROUTINE TESTING W REFLEX): HIV 1&2 Ab, 4th Generation: NONREACTIVE

## 2015-12-06 ENCOUNTER — Ambulatory Visit (INDEPENDENT_AMBULATORY_CARE_PROVIDER_SITE_OTHER): Payer: 59 | Admitting: Osteopathic Medicine

## 2015-12-06 ENCOUNTER — Encounter: Payer: Self-pay | Admitting: Osteopathic Medicine

## 2015-12-06 VITALS — BP 130/72 | HR 89 | Temp 98.2°F | Ht 63.5 in | Wt 180.0 lb

## 2015-12-06 DIAGNOSIS — J40 Bronchitis, not specified as acute or chronic: Secondary | ICD-10-CM | POA: Diagnosis not present

## 2015-12-06 DIAGNOSIS — J208 Acute bronchitis due to other specified organisms: Secondary | ICD-10-CM | POA: Diagnosis not present

## 2015-12-06 DIAGNOSIS — B9789 Other viral agents as the cause of diseases classified elsewhere: Principal | ICD-10-CM

## 2015-12-06 DIAGNOSIS — J069 Acute upper respiratory infection, unspecified: Secondary | ICD-10-CM | POA: Diagnosis not present

## 2015-12-06 MED ORDER — BENZONATATE 200 MG PO CAPS: 200.0000 mg | ORAL_CAPSULE | Freq: Two times a day (BID) | ORAL | Status: DC | PRN

## 2015-12-06 MED ORDER — GUAIFENESIN-CODEINE 100-10 MG/5ML PO SYRP
5.0000 mL | ORAL_SOLUTION | Freq: Three times a day (TID) | ORAL | Status: DC | PRN
Start: 2015-12-06 — End: 2015-12-06

## 2015-12-06 MED ORDER — IPRATROPIUM BROMIDE 0.03 % NA SOLN: 2.0000 | Freq: Two times a day (BID) | NASAL | Status: DC

## 2015-12-06 MED ORDER — GUAIFENESIN-CODEINE 100-10 MG/5ML PO SYRP: 5.0000 mL | ORAL_SOLUTION | Freq: Three times a day (TID) | ORAL | Status: DC | PRN

## 2015-12-06 MED ORDER — METHYLPREDNISOLONE 4 MG PO TBPK: ORAL_TABLET | ORAL | Status: DC

## 2015-12-06 MED ORDER — AZITHROMYCIN 250 MG PO TABS: ORAL_TABLET | ORAL | Status: DC

## 2015-12-06 MED ORDER — FLUTICASONE PROPIONATE HFA 110 MCG/ACT IN AERO: 2.0000 | INHALATION_SPRAY | Freq: Two times a day (BID) | RESPIRATORY_TRACT | Status: DC

## 2015-12-06 MED FILL — METHYLPREDNISOLONE 4 MG TAB: 4 | 6 days supply | Qty: 21 | Fill #0

## 2015-12-06 MED FILL — AZITHROMYCIN 250 MG TABLET: 250 | 1 days supply | Qty: 6 | Fill #0

## 2015-12-06 MED FILL — GUAIATUSSIN AC LIQUID: 100-10 | 12 days supply | Qty: 180 | Fill #0

## 2015-12-06 MED FILL — BENZONATATE 200 MG CAPSULE: 200 | 10 days supply | Qty: 20 | Fill #0

## 2015-12-06 MED FILL — IPRATROPIUM 0.03% SPRAY: 0.03 | 44 days supply | Qty: 30 | Fill #0

## 2015-12-06 MED FILL — FLOVENT HFA 110 MCG INHALER: 110 | 30 days supply | Qty: 12 | Fill #0

## 2015-12-06 NOTE — Progress Notes (Signed)
HPI: Dawn [name] is a 65 y.o. female who presents to [place] [facility]  today for chief complaint of:  Chief Complaint  Patient presents with  . Cough    . Assoc signs/symptoms: see ROS, sinus congestion  . Duration and Quality: 5-6 days started coughing, later developed fever and aches . Modifying factors: has tried the following OTC/Rx medications: tried Mucinex and Tylenol   Past medical, social and family history reviewed. Current medications and allergies reviewed.    Review of Systems: CONSTITUTIONAL: yes fever/chills, probably around 100F HEAD/EYES/EARS/NOSE/THROAT: yes headache, no vision change or hearing change, yes sore throat CARDIAC: No chest pain/pressure/palpitations RESPIRATORY: yes cough, no shortness of breath GASTROINTESTINAL: (+) nausea, no vomiting, no abdominal pain, yes diarrhea MUSCULOSKELETAL: yes myalgia/arthralgia   Exam:  BP 130/72 mmHg  Pulse 89  Temp(Src) 98.2 F (36.8 C) (Oral)  Ht 5' 3.5" (1.613 m)  Wt 180 lb (81.647 kg)  BMI 31.38 kg/m2 Constitutional: VSS, see above. General Appearance: alert, well-developed, well-nourished, NAD Eyes: Normal lids and conjunctive, non-icteric sclera Ears, Nose, Mouth, Throat: Normal external inspection ears/nares/mouth/lips/gums, normal TM, MMM;       posterior pharynx with erythema, without exudate Neck: No masses, trachea midline. normal lymph nodes Respiratory: Normal respiratory effort. No  wheeze/rhonchi/rales Cardiovascular: S1/S2 normal, no murmur/rub/gallop auscultated. RRR.   No results found for this or any previous visit (from the past 72 hour(s)). No results found.   ASSESSMENT/PLAN: Cough most ikely viral bronchitis/Viral URI. Printed Z-pack to fill if worse fever, productive cough, SOB. Printed inhaler to fill if cough persists despite po meds. RTC/ER precautions reviewed. Work note given.   Viral URI with cough - Plan: ipratropium (ATROVENT) 0.03 % nasal  spray, methylPREDNISolone (MEDROL DOSEPAK) 4 MG TBPK tablet, benzonatate (TESSALON) 200 MG capsule, guaiFENesin-codeine (ROBITUSSIN AC) 100-10 MG/5ML syrup, DISCONTINUED: guaiFENesin-codeine (ROBITUSSIN AC) 100-10 MG/5ML syrup  Acute viral bronchitis - Plan: methylPREDNISolone (MEDROL DOSEPAK) 4 MG TBPK tablet, benzonatate (TESSALON) 200 MG capsule, guaiFENesin-codeine (ROBITUSSIN AC) 100-10 MG/5ML syrup, DISCONTINUED: guaiFENesin-codeine (ROBITUSSIN AC) 100-10 MG/5ML syrup  Bronchitis - Plan: fluticasone (FLOVENT HFA) 110 MCG/ACT inhaler, azithromycin (ZITHROMAX) 250 MG tablet    Return if symptoms worsen or fail to improve.

## 2015-12-06 NOTE — Patient Instructions (Signed)
Viral Infections °A viral infection can be caused by different types of viruses. Most viral infections are not serious and resolve on their own. However, some infections may cause severe symptoms and may lead to further complications. °SYMPTOMS °Viruses can frequently cause: °· Minor sore throat. °· Aches and pains. °· Headaches. °· Runny nose. °· Different types of rashes. °· Watery eyes. °· Tiredness. °· Cough. °· Loss of appetite. °· Gastrointestinal infections, resulting in nausea, vomiting, and diarrhea. °These symptoms do not respond to antibiotics because the infection is not caused by bacteria. However, you might catch a bacterial infection following the viral infection. This is sometimes called a "superinfection." Symptoms of such a bacterial infection may include: °· Worsening sore throat with pus and difficulty swallowing. °· Swollen neck glands. °· Chills and a high or persistent fever. °· Severe headache. °· Tenderness over the sinuses. °· Persistent overall ill feeling (malaise), muscle aches, and tiredness (fatigue). °· Persistent cough. °· Yellow, green, or brown mucus production with coughing. °HOME CARE INSTRUCTIONS  °· Only take over-the-counter or prescription medicines for pain, discomfort, diarrhea, or fever as directed by your caregiver. °· Drink enough water and fluids to keep your urine clear or pale yellow. Sports drinks can provide valuable electrolytes, sugars, and hydration. °· Get plenty of rest and maintain proper nutrition. Soups and broths with crackers or rice are fine. °SEEK IMMEDIATE MEDICAL CARE IF:  °· You have severe headaches, shortness of breath, chest pain, neck pain, or an unusual rash. °· You have uncontrolled vomiting, diarrhea, or you are unable to keep down fluids. °· You or your child has an oral temperature above 102° F (38.9° C), not controlled by medicine. °· Your baby is older than 3 months with a rectal temperature of 102° F (38.9° C) or higher. °· Your baby is 3  months old or younger with a rectal temperature of 100.4° F (38° C) or higher. °MAKE SURE YOU:  °· Understand these instructions. °· Will watch your condition. °· Will get help right away if you are not doing well or get worse. °  °This information is not intended to replace advice given to you by your health care provider. Make sure you discuss any questions you have with your health care provider. °  °Document Released: 06/11/2005 Document Revised: 11/24/2011 Document Reviewed: 02/07/2015 °Elsevier Interactive Patient Education ©2016 [employer] ° °

## 2015-12-24 MED FILL — RALOXIFENE HCL 60 MG TABLET: 60 | 90 days supply | Qty: 90 | Fill #2

## 2015-12-27 DIAGNOSIS — Z01419 Encounter for gynecological examination (general) (routine) without abnormal findings: Secondary | ICD-10-CM | POA: Diagnosis not present

## 2015-12-27 DIAGNOSIS — Z6831 Body mass index (BMI) 31.0-31.9, adult: Secondary | ICD-10-CM | POA: Diagnosis not present

## 2016-03-11 MED FILL — OLMESARTAN-HCTZ 20-12.5 MG: 20-12.5 | 90 days supply | Qty: 90 | Fill #1

## 2016-03-24 MED FILL — RALOXIFENE HCL 60 MG TABLET: 60 | 90 days supply | Qty: 90 | Fill #3

## 2016-03-28 DIAGNOSIS — D239 Other benign neoplasm of skin, unspecified: Secondary | ICD-10-CM | POA: Diagnosis not present

## 2016-03-28 DIAGNOSIS — L814 Other melanin hyperpigmentation: Secondary | ICD-10-CM | POA: Diagnosis not present

## 2016-03-28 DIAGNOSIS — D485 Neoplasm of uncertain behavior of skin: Secondary | ICD-10-CM | POA: Diagnosis not present

## 2016-04-25 DIAGNOSIS — Z1231 Encounter for screening mammogram for malignant neoplasm of breast: Secondary | ICD-10-CM | POA: Diagnosis not present

## 2016-06-04 ENCOUNTER — Other Ambulatory Visit: Payer: Self-pay | Admitting: Family Medicine

## 2016-06-04 ENCOUNTER — Ambulatory Visit (INDEPENDENT_AMBULATORY_CARE_PROVIDER_SITE_OTHER): Payer: 59 | Admitting: Family Medicine

## 2016-06-04 ENCOUNTER — Encounter: Payer: Self-pay | Admitting: Family Medicine

## 2016-06-04 VITALS — BP 123/75 | HR 74 | Ht 63.5 in | Wt 175.0 lb

## 2016-06-04 DIAGNOSIS — Z23 Encounter for immunization: Secondary | ICD-10-CM | POA: Diagnosis not present

## 2016-06-04 DIAGNOSIS — K760 Fatty (change of) liver, not elsewhere classified: Secondary | ICD-10-CM

## 2016-06-04 DIAGNOSIS — E785 Hyperlipidemia, unspecified: Secondary | ICD-10-CM | POA: Diagnosis not present

## 2016-06-04 DIAGNOSIS — I10 Essential (primary) hypertension: Secondary | ICD-10-CM

## 2016-06-04 DIAGNOSIS — K2100 Gastro-esophageal reflux disease with esophagitis, without bleeding: Secondary | ICD-10-CM

## 2016-06-04 DIAGNOSIS — R7301 Impaired fasting glucose: Secondary | ICD-10-CM | POA: Diagnosis not present

## 2016-06-04 LAB — COMPLETE METABOLIC PANEL WITH GFR
ALBUMIN: 3.9 g/dL (ref 3.6–5.1)
ALK PHOS: 42 U/L (ref 33–130)
ALT: 162 U/L — AB (ref 6–29)
AST: 77 U/L — AB (ref 10–35)
BUN: 11 mg/dL (ref 7–25)
CALCIUM: 9.1 mg/dL (ref 8.6–10.4)
CHLORIDE: 103 mmol/L (ref 98–110)
CO2: 26 mmol/L (ref 20–31)
CREATININE: 0.78 mg/dL (ref 0.50–0.99)
GFR, Est African American: >89 mL/min (ref >=60)
GFR, Est Non African American: 81 mL/min (ref >=60)
Glucose, Bld: 91 mg/dL (ref 65–99)
Potassium: 3.9 mmol/L (ref 3.5–5.3)
Sodium: 139 mmol/L (ref 135–146)
Total Bilirubin: 0.7 mg/dL (ref 0.2–1.2)
Total Protein: 6.6 g/dL (ref 6.1–8.1)

## 2016-06-04 LAB — POCT GLYCOSYLATED HEMOGLOBIN (HGB A1C): Hemoglobin A1C: 5.4

## 2016-06-04 MED ORDER — PITAVASTATIN CALCIUM 2 MG PO TABS: ORAL_TABLET | ORAL | 3 refills | Status: DC

## 2016-06-04 MED FILL — LIVALO 2 MG TABLET: 2 | 90 days supply | Qty: 135 | Fill #0

## 2016-06-04 NOTE — Progress Notes (Signed)
Subjective:    CC: HTN  HPI:  Hypertension- Pt denies chest pain, SOB, dizziness, or heart palpitations.  Taking meds as directed w/o problems.  Denies medication side effects.    IFG - No inc thrist or urination.   Fatty liver - she is doing great.  He has been trying to eat healthy and has been walking 3 miles a day.  She has been having more reflux and heartburn symptoms lately. She said she'll even wake up with a sore raspy throat. She has been eating a lot of fresh tomatoes from her garden this summer. She occasionally will take a Tums for it does get some temporary relief.  Past medical history, Surgical history, Family history not pertinant except as noted below, Social history, Allergies, and medications have been entered into the medical record, reviewed, and corrections made.   Review of Systems: No fevers, chills, night sweats, weight loss, chest pain, or shortness of breath.   Objective:    General: Well Developed, well nourished, and in no acute distress.  Neuro: Alert and oriented x3, extra-ocular muscles intact, sensation grossly intact.  HEENT: Normocephalic, atraumatic  Skin: Warm and dry, no rashes. Cardiac: Regular rate and rhythm, no murmurs rubs or gallops, no lower extremity edema.  Respiratory: Clear to auscultation bilaterally. Not using accessory muscles, speaking in full sentences.   Impression and Recommendations:   HTN - Well controlled. Continue current regimen. Follow up in  6 mo.   IFG - Hemoglobin A1c of 5.4 today looks fantastic. Can start checking her yearly instead of every 6 months.  Fatty liver - due to recheck liver.   GERD-recommend start an over-the-counter H2 blocker such as Zantac. Take about 15-20 minutes before first meal a day and in the evening. Recommend treatment for 4-8 weeks depending on symptoms. Also discussed dietary measures addition to elevating the head of the bed for symptom relief.

## 2016-06-05 NOTE — Progress Notes (Signed)
All labs are normal. 

## 2016-06-09 ENCOUNTER — Other Ambulatory Visit: Payer: Self-pay | Admitting: *Deleted

## 2016-06-09 MED ORDER — MECLIZINE HCL 25 MG PO TABS: 25.0000 mg | ORAL_TABLET | Freq: Three times a day (TID) | ORAL | 3 refills | Status: DC | PRN

## 2016-06-09 MED FILL — MECLIZINE 25 MG TABLET: 25 | 5 days supply | Qty: 60 | Fill #0

## 2016-06-16 MED FILL — OLMESARTAN-HCTZ 20-12.5 MG: 20-12.5 | 90 days supply | Qty: 90 | Fill #2

## 2016-06-23 MED FILL — RALOXIFENE HCL 60 MG TABLET: 60 | 90 days supply | Qty: 90 | Fill #0

## 2016-07-10 DIAGNOSIS — H524 Presbyopia: Secondary | ICD-10-CM | POA: Diagnosis not present

## 2016-09-11 MED FILL — OLMESARTAN-HCTZ 20-12.5 MG: 20-12.5 | 90 days supply | Qty: 90 | Fill #3

## 2016-09-24 MED FILL — RALOXIFENE HCL 60 MG TABLET: 60 | 90 days supply | Qty: 90 | Fill #1

## 2016-10-30 DIAGNOSIS — D492 Neoplasm of unspecified behavior of bone, soft tissue, and skin: Secondary | ICD-10-CM | POA: Diagnosis not present

## 2016-11-27 ENCOUNTER — Ambulatory Visit (INDEPENDENT_AMBULATORY_CARE_PROVIDER_SITE_OTHER): Payer: 59 | Admitting: Family Medicine

## 2016-11-27 ENCOUNTER — Encounter: Payer: Self-pay | Admitting: Family Medicine

## 2016-11-27 VITALS — BP 113/58 | HR 82 | Ht 64.0 in | Wt 183.0 lb

## 2016-11-27 DIAGNOSIS — I10 Essential (primary) hypertension: Secondary | ICD-10-CM | POA: Diagnosis not present

## 2016-11-27 DIAGNOSIS — R7301 Impaired fasting glucose: Secondary | ICD-10-CM

## 2016-11-27 LAB — POCT GLYCOSYLATED HEMOGLOBIN (HGB A1C): HEMOGLOBIN A1C: 5.3

## 2016-11-27 MED ORDER — BENICAR HCT 20-12.5 MG PO TABS: ORAL_TABLET | ORAL | 3 refills | Status: DC

## 2016-11-27 MED FILL — OLMESARTAN-HCTZ 20-12.5 MG: 20-12.5 | 90 days supply | Qty: 90 | Fill #0

## 2016-11-27 NOTE — Patient Instructions (Signed)
Don't forget to schedule your medicare wellness exam this summer!

## 2016-11-27 NOTE — Progress Notes (Signed)
Subjective:    CC: HTN, IFG  HPI: Hypertension- Pt denies chest pain, SOB, dizziness, or heart palpitations.  Taking meds as directed w/o problems.  Denies medication side effects.    Impaired fasting glucose-no increased thirst or urination. No symptoms consistent with hypoglycemia.   Past medical history, Surgical history, Family history not pertinant except as noted below, Social history, Allergies, and medications have been entered into the medical record, reviewed, and corrections made.   Review of Systems: No fevers, chills, night sweats, weight loss, chest pain, or shortness of breath.   Objective:    General: Well Developed, well nourished, and in no acute distress.  Neuro: Alert and oriented x3, extra-ocular muscles intact, sensation grossly intact.  HEENT: Normocephalic, atraumatic  Skin: Warm and dry, no rashes. Cardiac: Regular rate and rhythm, no murmurs rubs or gallops, no lower extremity edema.  Respiratory: Clear to auscultation bilaterally. Not using accessory muscles, speaking in full sentences.   Impression and Recommendations:    HTN - Well controlled. Continue current regimen. Follow up in  6 months.   IFG - Well controlled. Continue current regimen. Follow up in  Controlled.  A1C 5.3 today.

## 2016-12-03 ENCOUNTER — Ambulatory Visit: Payer: 59 | Admitting: Family Medicine

## 2016-12-24 MED FILL — RALOXIFENE HCL 60 MG TABLET: 60 | 90 days supply | Qty: 90 | Fill #2

## 2016-12-25 DIAGNOSIS — H25813 Combined forms of age-related cataract, bilateral: Secondary | ICD-10-CM | POA: Diagnosis not present

## 2016-12-25 DIAGNOSIS — R7301 Impaired fasting glucose: Secondary | ICD-10-CM | POA: Diagnosis not present

## 2016-12-25 DIAGNOSIS — H1851 Endothelial corneal dystrophy: Secondary | ICD-10-CM | POA: Diagnosis not present

## 2016-12-25 DIAGNOSIS — I10 Essential (primary) hypertension: Secondary | ICD-10-CM | POA: Diagnosis not present

## 2016-12-25 DIAGNOSIS — H43813 Vitreous degeneration, bilateral: Secondary | ICD-10-CM | POA: Diagnosis not present

## 2016-12-25 LAB — BASIC METABOLIC PANEL
BUN: 13 mg/dL (ref 7–25)
CO2: 28 mmol/L (ref 20–31)
CREATININE: 0.95 mg/dL (ref 0.50–0.99)
Calcium: 9.2 mg/dL (ref 8.6–10.4)
Chloride: 104 mmol/L (ref 98–110)
Glucose, Bld: 101 mg/dL — ABNORMAL HIGH (ref 65–99)
POTASSIUM: 3.9 mmol/L (ref 3.5–5.3)
SODIUM: 141 mmol/L (ref 135–146)

## 2016-12-25 LAB — LIPID PANEL W/REFLEX DIRECT LDL
CHOLESTEROL: 179 mg/dL (ref <200)
HDL: 54 mg/dL (ref >50)
LDL-Cholesterol: 104 mg/dL — ABNORMAL HIGH
Non-HDL Cholesterol (Calc): 125 mg/dL (ref <130)
TRIGLYCERIDES: 114 mg/dL (ref <150)
Total CHOL/HDL Ratio: 3.3 Ratio (ref <5.0)

## 2016-12-25 MED FILL — SODIUM CHLORIDE 5% EYE OINT: 5 | 7 days supply | Qty: 4 | Fill #0 | Status: TO

## 2016-12-25 MED FILL — SODIUM CHLORIDE 5% EYE DROP: 5 | 90 days supply | Qty: 30 | Fill #0

## 2017-01-06 DIAGNOSIS — H43393 Other vitreous opacities, bilateral: Secondary | ICD-10-CM | POA: Diagnosis not present

## 2017-01-06 DIAGNOSIS — H33191 Other retinoschisis and retinal cysts, right eye: Secondary | ICD-10-CM | POA: Diagnosis not present

## 2017-01-06 DIAGNOSIS — H43813 Vitreous degeneration, bilateral: Secondary | ICD-10-CM | POA: Diagnosis not present

## 2017-01-22 DIAGNOSIS — Z01419 Encounter for gynecological examination (general) (routine) without abnormal findings: Secondary | ICD-10-CM | POA: Diagnosis not present

## 2017-01-22 DIAGNOSIS — Z13820 Encounter for screening for osteoporosis: Secondary | ICD-10-CM | POA: Diagnosis not present

## 2017-01-22 DIAGNOSIS — Z6832 Body mass index (BMI) 32.0-32.9, adult: Secondary | ICD-10-CM | POA: Diagnosis not present

## 2017-03-11 MED FILL — RALOXIFENE HCL 60 MG TABLET: 60 | 90 days supply | Qty: 90 | Fill #0

## 2017-03-11 MED FILL — OLMESARTAN-HCTZ 20-12.5 MG: 20-12.5 | 90 days supply | Qty: 90 | Fill #1 | Status: TO

## 2017-03-30 ENCOUNTER — Ambulatory Visit (INDEPENDENT_AMBULATORY_CARE_PROVIDER_SITE_OTHER): Payer: 59 | Admitting: Family Medicine

## 2017-03-30 ENCOUNTER — Encounter: Payer: Self-pay | Admitting: Family Medicine

## 2017-03-30 VITALS — BP 121/60 | HR 75 | Ht 63.0 in | Wt 183.0 lb

## 2017-03-30 DIAGNOSIS — R1030 Lower abdominal pain, unspecified: Secondary | ICD-10-CM

## 2017-03-30 DIAGNOSIS — R300 Dysuria: Secondary | ICD-10-CM

## 2017-03-30 LAB — POCT URINALYSIS DIPSTICK
BILIRUBIN UA: NEGATIVE
GLUCOSE UA: NEGATIVE
KETONES UA: NEGATIVE
Nitrite, UA: NEGATIVE
PH UA: 7 (ref 5.0–8.0)
Protein, UA: NEGATIVE
Spec Grav, UA: 1.015 (ref 1.010–1.025)
Urobilinogen, UA: 0.2 E.U./dL

## 2017-03-30 MED ORDER — SULFAMETHOXAZOLE-TRIMETHOPRIM 800-160 MG PO TABS: 1.0000 | ORAL_TABLET | Freq: Two times a day (BID) | ORAL | 0 refills | Status: DC

## 2017-03-30 MED FILL — SULFAMETHOXAZOLE/TMP DS TAB: 800-160 | 3 days supply | Qty: 6 | Fill #0

## 2017-03-30 NOTE — Patient Instructions (Signed)
Consider ultrasound if not improving

## 2017-03-30 NOTE — Progress Notes (Signed)
Subjective:    Patient ID: Dawn [name], female    DOB: 03-10-1951, 66 y.o.   MRN: [id number]  HPI 66 year old female comes in today complaining of abdominal pain. She recently went to [place]. She got back at the end of May and ever since then she just had some intermittent lower abdominal pain on the right and the left side. More recently she is asked to try to take some over-the-counter probiotics that are primarily lactobacillus. She isn't really convinced that it has helped. She mostly has had cramping and bloating but no nausea or vomiting. No diarrhea or constipation. She says she drinks lots of water daily and feels like her bowels move fairly normally. Then yesterday she noticed a little blood while wiping. She's not sure if it came from the urinary, vaginal or rectal area. She has not seen any blood mixed into the stool. She does take an aspirin daily.  She just had her Pap smear in early May and was told it was normal.  Review of Systems  BP 121/60   Pulse 75   Ht 5\' 3"  (1.6 m)   Wt 183 lb (83 kg)   BMI 32.42 kg/m     Allergies  Allergen Reactions  . Crestor [Rosuvastatin Calcium] Other (See Comments)    Myalgias   . Penicillins     REACTION: Rash    Past Medical History:  Diagnosis Date  . Anemia    as a child.  . Arthritis   . Cancer ([place])    skin  . Hearing loss    Right side  . Heart murmur    childhood  . Hyperlipidemia   . Hypertension   . IBS (irritable bowel syndrome)   . Obesity     Past Surgical History:  Procedure Laterality Date  . COLONOSCOPY      Social History   Social History  . Marital status: Married    Spouse name: N/A  . Number of children: N/A  . Years of education: N/A   Occupational History  .  [employer]    Social History Main Topics  . Smoking status: Never Smoker  . Smokeless tobacco: Never Used  . Alcohol use 0.0 oz/week     Comment: <1/week wine or beer  . Drug use: No  .  Sexual activity: Not on file   Other Topics Concern  . Not on file   Social History Narrative   2-3 caffeine drinks per day. Regular exercise.  Walking 3 miles a day.     Family History  Problem Relation Age of Onset  . Cancer Father        died of bladder cancer  . Stroke Mother   . Colon cancer Father 69  . Diabetes Maternal Grandfather   . Kidney disease Maternal Grandfather   . Heart attack Father 69  . Asthma Brother     Outpatient Encounter Prescriptions as of 03/30/2017  Medication Sig  . aspirin 81 MG tablet Take 81 mg by mouth daily.    Marland Kitchen BENICAR HCT 20-12.5 MG tablet TAKE 1 TABLET BY MOUTH DAILY.  . fluticasone (FLONASE) 50 MCG/ACT nasal spray Place 1 spray into both nostrils daily. I-2 SPRAYS AS NEEDED.  Marland Kitchen meclizine (ANTIVERT) 25 MG tablet Take 1-2 tablets (25-50 mg total) by mouth 3 (three) times daily as needed for dizziness.  . Multiple Vitamin (MULTIVITAMIN) capsule Take 1 capsule by mouth daily.    Marland Kitchen  Pitavastatin Calcium (LIVALO) 2 MG TABS TAKE 1 & 1/2 TABLETS (3 MG TOTAL) BY MOUTH AT BEDTIME.  . raloxifene (EVISTA) 60 MG tablet Take 1 tablet by mouth daily.  . vitamin C (ASCORBIC ACID) 500 MG tablet Take 500 mg by mouth daily.  Marland Kitchen sulfamethoxazole-trimethoprim (BACTRIM DS,SEPTRA DS) 800-160 MG tablet Take 1 tablet by mouth 2 (two) times daily.   No facility-administered encounter medications on file as of 03/30/2017.         Objective:   Physical Exam  Constitutional: She is oriented to person, place, and time. She appears well-developed and well-nourished.  HENT:  Head: Normocephalic and atraumatic.  Cardiovascular: Normal rate, regular rhythm and normal heart sounds.   Pulmonary/Chest: Effort normal and breath sounds normal.  Abdominal: Soft. Bowel sounds are normal. She exhibits no distension and no mass. There is tenderness. There is no rebound and no guarding.  + tenderness in the RLQ and LLQ.    Neurological: She is alert and oriented to person,  place, and time.  Skin: Skin is warm and dry.  Psychiatric: She has a normal mood and affect. Her behavior is normal.       Assessment & Plan:  Lower abdominal pain-mostly just some discomfort and cramping but no actual change in bowels. Laboratory urinalysis just to rule out UTI. Didn't see anything externally on exam. No blood around the urethra. No blood in vaginal vault and no blood around the rectal area. Didn't see any swollen hemorrhoids or tears. She said she did notice a blood clot that she passed her she was doing the urine sample so she thinks it may have come from the urethral area.  She does have a family history of ovarian cancer and so consider pelvic ultrasound if urinalysis is negative and she's not improving.

## 2017-03-31 LAB — URINE CULTURE: ORGANISM ID, BACTERIA: NO GROWTH

## 2017-03-31 LAB — URINALYSIS, MICROSCOPIC ONLY
BACTERIA UA: NONE SEEN [HPF]
Casts: NONE SEEN [LPF]
Crystals: NONE SEEN [HPF]
Squamous Epithelial / LPF: NONE SEEN [HPF] (ref <=5)
YEAST: NONE SEEN [HPF]

## 2017-04-02 ENCOUNTER — Telehealth (INDEPENDENT_AMBULATORY_CARE_PROVIDER_SITE_OTHER): Payer: 59 | Admitting: *Deleted

## 2017-04-02 ENCOUNTER — Emergency Department (HOSPITAL_COMMUNITY)
Admission: EM | Admit: 2017-04-02 | Discharge: 2017-04-03 | Disposition: A | Discharge status: Home / Self Care | Payer: 59 | Attending: Emergency Medicine | Admitting: Emergency Medicine

## 2017-04-02 ENCOUNTER — Encounter (HOSPITAL_COMMUNITY): Payer: Self-pay | Admitting: Emergency Medicine

## 2017-04-02 DIAGNOSIS — I10 Essential (primary) hypertension: Secondary | ICD-10-CM | POA: Insufficient documentation

## 2017-04-02 DIAGNOSIS — E876 Hypokalemia: Secondary | ICD-10-CM | POA: Insufficient documentation

## 2017-04-02 DIAGNOSIS — R319 Hematuria, unspecified: Secondary | ICD-10-CM

## 2017-04-02 DIAGNOSIS — Z7982 Long term (current) use of aspirin: Secondary | ICD-10-CM | POA: Diagnosis not present

## 2017-04-02 DIAGNOSIS — R42 Dizziness and giddiness: Secondary | ICD-10-CM | POA: Diagnosis not present

## 2017-04-02 DIAGNOSIS — N939 Abnormal uterine and vaginal bleeding, unspecified: Secondary | ICD-10-CM | POA: Insufficient documentation

## 2017-04-02 DIAGNOSIS — R109 Unspecified abdominal pain: Secondary | ICD-10-CM | POA: Diagnosis not present

## 2017-04-02 DIAGNOSIS — R112 Nausea with vomiting, unspecified: Secondary | ICD-10-CM | POA: Insufficient documentation

## 2017-04-02 DIAGNOSIS — Z79899 Other long term (current) drug therapy: Secondary | ICD-10-CM | POA: Insufficient documentation

## 2017-04-02 DIAGNOSIS — R404 Transient alteration of awareness: Secondary | ICD-10-CM | POA: Diagnosis not present

## 2017-04-02 LAB — POCT URINALYSIS DIPSTICK
BILIRUBIN UA: NEGATIVE
GLUCOSE UA: NEGATIVE
Ketones, UA: NEGATIVE
NITRITE UA: NEGATIVE
Spec Grav, UA: 1.01 (ref 1.010–1.025)
Urobilinogen, UA: 0.2 E.U./dL
pH, UA: 6.5 (ref 5.0–8.0)

## 2017-04-02 NOTE — Telephone Encounter (Signed)
Pt came by and dropped off a urine sample for retesting. She also asked that a referral for Dr. Amalia Hailey be placed he is a urologist in [place].Dawn [name] [place]

## 2017-04-02 NOTE — ED Triage Notes (Addendum)
Per pt report, pt has been having abdominal bloating/cramping, diarrhea, and vomiting for several days. She says she went to [place] in May and "hasn't felt quite right" since she came back. Pt reports she noticed blood in her urine a few days ago but has been seen by physician for this issue and was waiting to hear back from the physician on further instructions. Pt also reports she just finished a round of bactrim. Pt reports she has had multiple episodes of vomiting today. Pt given 4mg  Zofran by EMS prior to arrival.

## 2017-04-03 ENCOUNTER — Emergency Department (HOSPITAL_COMMUNITY): Payer: 59

## 2017-04-03 ENCOUNTER — Encounter (HOSPITAL_COMMUNITY): Payer: Self-pay

## 2017-04-03 ENCOUNTER — Telehealth: Payer: Self-pay | Admitting: Family Medicine

## 2017-04-03 DIAGNOSIS — R197 Diarrhea, unspecified: Secondary | ICD-10-CM | POA: Diagnosis not present

## 2017-04-03 DIAGNOSIS — R42 Dizziness and giddiness: Secondary | ICD-10-CM | POA: Diagnosis not present

## 2017-04-03 DIAGNOSIS — R1110 Vomiting, unspecified: Secondary | ICD-10-CM | POA: Diagnosis not present

## 2017-04-03 LAB — URINALYSIS, ROUTINE W REFLEX MICROSCOPIC
BILIRUBIN URINE: NEGATIVE
Glucose, UA: 50 mg/dL — AB
Hgb urine dipstick: NEGATIVE
KETONES UR: 5 mg/dL — AB
Nitrite: NEGATIVE
PH: 7 (ref 5.0–8.0)
Protein, ur: NEGATIVE mg/dL
SPECIFIC GRAVITY, URINE: 1.008 (ref 1.005–1.030)
SQUAMOUS EPITHELIAL / LPF: NONE SEEN

## 2017-04-03 LAB — URINALYSIS, MICROSCOPIC ONLY
Casts: NONE SEEN [LPF]
Crystals: NONE SEEN [HPF]
Yeast: NONE SEEN [HPF]

## 2017-04-03 LAB — COMPREHENSIVE METABOLIC PANEL
ALBUMIN: 3.8 g/dL (ref 3.5–5.0)
ALT: 169 U/L — AB (ref 14–54)
AST: 113 U/L — AB (ref 15–41)
Alkaline Phosphatase: 42 U/L (ref 38–126)
Anion gap: 11 (ref 5–15)
BUN: 13 mg/dL (ref 6–20)
CHLORIDE: 94 mmol/L — AB (ref 101–111)
CO2: 24 mmol/L (ref 22–32)
CREATININE: 1 mg/dL (ref 0.44–1.00)
Calcium: 8.3 mg/dL — ABNORMAL LOW (ref 8.9–10.3)
GFR calc Af Amer: >60 mL/min (ref >60)
GFR calc non Af Amer: 58 mL/min — ABNORMAL LOW (ref >60)
Glucose, Bld: 188 mg/dL — ABNORMAL HIGH (ref 65–99)
POTASSIUM: 3.1 mmol/L — AB (ref 3.5–5.1)
SODIUM: 129 mmol/L — AB (ref 135–145)
Total Bilirubin: 0.7 mg/dL (ref 0.3–1.2)
Total Protein: 7.1 g/dL (ref 6.5–8.1)

## 2017-04-03 LAB — CBC
HCT: 34.6 % — ABNORMAL LOW (ref 36.0–46.0)
HEMATOCRIT: 36.1 % (ref 36.0–46.0)
HEMOGLOBIN: 12.1 g/dL (ref 12.0–15.0)
Hemoglobin: 12.9 g/dL (ref 12.0–15.0)
MCH: 32.1 pg (ref 26.0–34.0)
MCH: 32.3 pg (ref 26.0–34.0)
MCHC: 35 g/dL (ref 30.0–36.0)
MCHC: 35.7 g/dL (ref 30.0–36.0)
MCV: 90.5 fL (ref 78.0–100.0)
MCV: 91.8 fL (ref 78.0–100.0)
PLATELETS: 171 10*3/uL (ref 150–400)
PLATELETS: 195 10*3/uL (ref 150–400)
RBC: 3.77 MIL/uL — AB (ref 3.87–5.11)
RBC: 3.99 MIL/uL (ref 3.87–5.11)
RDW: 12.3 % (ref 11.5–15.5)
RDW: 12.6 % (ref 11.5–15.5)
WBC: 5.9 10*3/uL (ref 4.0–10.5)
WBC: 7.1 10*3/uL (ref 4.0–10.5)

## 2017-04-03 LAB — LIPASE, BLOOD: LIPASE: 13 U/L (ref 11–51)

## 2017-04-03 MED ORDER — POTASSIUM CHLORIDE CRYS ER 20 MEQ PO TBCR
40.0000 meq | EXTENDED_RELEASE_TABLET | Freq: Once | ORAL | Status: AC
  Administered 2017-04-03: 40 meq via ORAL
  Filled 2017-04-03: qty 2

## 2017-04-03 MED ORDER — IOPAMIDOL (ISOVUE-300) INJECTION 61%
100.0000 mL | Freq: Once | INTRAVENOUS | Status: AC | PRN
  Administered 2017-04-03: 100 mL via INTRAVENOUS

## 2017-04-03 MED ORDER — MEGESTROL ACETATE 20 MG PO TABS: 20.0000 mg | ORAL_TABLET | Freq: Every day | ORAL | 0 refills | Status: DC

## 2017-04-03 MED ORDER — IOPAMIDOL (ISOVUE-300) INJECTION 61%
INTRAVENOUS | Status: AC
Start: 2017-04-03 — End: 2017-04-03
  Administered 2017-04-03: 100 mL via INTRAVENOUS
  Filled 2017-04-03: qty 100

## 2017-04-03 MED ORDER — MEGESTROL ACETATE 20 MG PO TABS
20.0000 mg | ORAL_TABLET | Freq: Every day | ORAL | Status: DC
  Administered 2017-04-03: 20 mg via ORAL
  Filled 2017-04-03: qty 1

## 2017-04-03 MED ORDER — FERROUS SULFATE 325 (65 FE) MG PO TABS: 325.0000 mg | ORAL_TABLET | Freq: Every day | ORAL | 0 refills | Status: DC

## 2017-04-03 MED ORDER — MECLIZINE HCL 25 MG PO TABS
50.0000 mg | ORAL_TABLET | Freq: Once | ORAL | Status: AC
  Administered 2017-04-03: 50 mg via ORAL
  Filled 2017-04-03: qty 2

## 2017-04-03 MED ORDER — MECLIZINE HCL 25 MG PO TABS: 25.0000 mg | ORAL_TABLET | Freq: Three times a day (TID) | ORAL | 0 refills | Status: DC | PRN

## 2017-04-03 MED ORDER — ONDANSETRON HCL 4 MG PO TABS: 4.0000 mg | ORAL_TABLET | Freq: Three times a day (TID) | ORAL | 0 refills | Status: DC | PRN

## 2017-04-03 MED ORDER — FERROUS SULFATE 325 (65 FE) MG PO TABS
325.0000 mg | ORAL_TABLET | Freq: Once | ORAL | Status: AC
  Administered 2017-04-03: 325 mg via ORAL
  Filled 2017-04-03: qty 1

## 2017-04-03 MED FILL — MECLIZINE 25 MG TABLET: 25 | 5 days supply | Qty: 30 | Fill #0

## 2017-04-03 MED FILL — MEGESTROL 20 MG TABLET: 20 | 30 days supply | Qty: 30 | Fill #0

## 2017-04-03 MED FILL — ONDANSETRON HCL 4 MG TABLET: 4 | 4 days supply | Qty: 12 | Fill #0

## 2017-04-03 NOTE — ED Provider Notes (Signed)
Patient seen/examined in the Emergency Department in conjunction with Midlevel Provider  Patient reports abdominal pain, diarrhea, and also vertigo like symptoms Exam : awake/alert, mild diffuse lower abd tenderness, no arm/leg drift, no pastpointing Plan: pt well appearing, workup pending, if negative I anticipate discharge home    Ripley Fraise, MD 04/03/17 [phone]

## 2017-04-03 NOTE — ED Notes (Signed)
ED Provider at bedside. 

## 2017-04-03 NOTE — Discharge Instructions (Signed)
Your doctor will call and schedule a close follow up appointment for further evaluation of your abnormal bleeding.  If you do not hear from them, please call and schedule your appointment soon.  Your hemoglobin is stable currently.  Return if you have any concerns.

## 2017-04-03 NOTE — Telephone Encounter (Signed)
Patient called adv she went to emergency room yesterday for vomiting and diarrhea and they did a Ct of head. She woke up bleeding and it was everywhere. See Ed notes from [facility]. Thanks

## 2017-04-03 NOTE — ED Provider Notes (Signed)
[place] DEPT Provider Note   CSN: [id number] Arrival date & time: 04/02/17  2319     History   Chief Complaint Chief Complaint  Patient presents with  . Emesis  . Diarrhea    HPI Dawn [name] is a 66 y.o. female with a hx of Anemia, vertigo presents to the Emergency Department complaining of gradual, persistent, progressively worsening dizziness with associated nausea vomiting and diarrhea onset around 7:30 PM tonight. Patient denies thunderclap headache or sudden onset but does report rapid increase in symptoms. She reports numerous episodes of nonbloody and nonbilious emesis and 3 episodes of loose stool without melena or manic easy a. She reports travel to [place] in May 2018 and since that time has had intermittent cramping and bloating but no vomiting or diarrhea. Patient reports that 5 days ago she noticed hematuria and due to a family history of bladder cancer was evaluated by her primary care physician. She reports a 3 day prescription of Bactrim which she completed this morning but states urine cultures did not show evidence of urinary tract infection. Patient reports that tonight she had associated generalized abdominal cramping and feeling weak. She reports EMS gave Zofran which improved her symptoms significantly. She had no known aggravating factors. She denies additional foreign travel, fevers, chills, headache or neck pain, chest pain, shortness of breath, syncope, near syncope, numbness, tingling, weakness, loss of bowel or bladder control.  OB:  [facility] - [facility].   The history is provided by the patient, the spouse and medical records. No language interpreter was used.  Diarrhea   Associated symptoms include abdominal pain, vomiting and chills. Pertinent negatives include no headaches and no cough.    Past Medical History:  Diagnosis Date  . Anemia    as a child.  . Arthritis   . Cancer ([place])    skin  . Hearing loss    Right side  . Heart murmur     childhood  . Hyperlipidemia   . Hypertension   . IBS (irritable bowel syndrome)   . Obesity     Patient Active Problem List   Diagnosis Date Noted  . Osteoporosis 05/24/2015  . IFG (impaired fasting glucose) 12/02/2013  . Meniere disease 01/31/2011  . SEBORRHEIC KERATOSIS 07/10/2010  . MUSCLE SPASM, TRAPEZIUS 07/10/2010  . 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 POSTMENOPAUSAL STATUS 03/08/2010  . INTERNAL HEMORRHOIDS 07/26/2009  . EROSIVE ESOPHAGITIS 07/26/2009  . DIVERTICULOSIS, COLON 07/26/2009  . FATTY LIVER DISEASE 07/26/2009  . COLONIC POLYPS, HYPERPLASTIC, HX OF 07/26/2009  . TRANSAMINASES, SERUM, ELEVATED 01/31/2009  . HYPERTENSION, BENIGN 01/16/2009  . Hyperlipidemia 04/22/2007  . LOSS, HEARING NOS 04/22/2007    Past Surgical History:  Procedure Laterality Date  . COLONOSCOPY      OB History    No data available       Home Medications    Prior to Admission medications   Medication Sig Start Date End Date Taking? Authorizing Provider  aspirin 81 MG tablet Take 81 mg by mouth daily.     Yes [provider]  BENICAR HCT 20-12.5 MG tablet TAKE 1 TABLET BY MOUTH DAILY. 11/27/16  Yes [name], MD  ibuprofen (ADVIL,MOTRIN) 200 MG tablet Take 600 mg by mouth every 6 (six) hours as needed for moderate pain.   Yes [provider]  Multiple Vitamin (MULTIVITAMIN) capsule Take 1 capsule by mouth daily.     Yes [provider]  raloxifene (EVISTA) 60 MG tablet Take 1 tablet  by mouth daily. 09/07/15  Yes [provider]  vitamin C (ASCORBIC ACID) 500 MG tablet Take 500 mg by mouth daily.   Yes [provider]  fluticasone (FLONASE) 50 MCG/ACT nasal spray Place 1 spray into both nostrils daily. I-2 SPRAYS AS NEEDED. Patient not taking: Reported on 04/03/2017 06/02/14   [name], MD  meclizine (ANTIVERT) 25 MG tablet Take 1-2 tablets (25-50 mg total) by mouth 3 (three) times daily as needed for  dizziness. Patient not taking: Reported on 04/03/2017 06/09/16   [name], MD  Pitavastatin Calcium (LIVALO) 2 MG TABS TAKE 1 & 1/2 TABLETS (3 MG TOTAL) BY MOUTH AT BEDTIME. Patient not taking: Reported on 04/03/2017 06/04/16   [name], MD  sulfamethoxazole-trimethoprim (BACTRIM DS,SEPTRA DS) 800-160 MG tablet Take 1 tablet by mouth 2 (two) times daily. Patient not taking: Reported on 04/03/2017 03/30/17   [name], MD    Family History Family History  Problem Relation Age of Onset  . Cancer Father        died of bladder cancer  . Colon cancer Father 48  . Heart attack Father 73  . Stroke Mother   . Diabetes Maternal Grandfather   . Kidney disease Maternal Grandfather   . Asthma Brother     Social History Social History  Substance Use Topics  . Smoking status: Never Smoker  . Smokeless tobacco: Never Used  . Alcohol use 0.0 oz/week     Comment: <1/week wine or beer     Allergies   Crestor [rosuvastatin calcium] and Penicillins   Review of Systems Review of Systems  Constitutional: Positive for chills and fatigue. Negative for appetite change, diaphoresis, fever and unexpected weight change.  HENT: Negative for mouth sores.   Eyes: Negative for visual disturbance.  Respiratory: Negative for cough, chest tightness, shortness of breath and wheezing.   Cardiovascular: Negative for chest pain.  Gastrointestinal: Positive for abdominal pain, diarrhea, nausea and vomiting. Negative for constipation.  Endocrine: Negative for polydipsia, polyphagia and polyuria.  Genitourinary: Negative for dysuria, frequency, hematuria and urgency.  Musculoskeletal: Negative for back pain and neck stiffness.  Skin: Negative for rash.  Allergic/Immunologic: Negative for immunocompromised state.  Neurological: Positive for dizziness. Negative for syncope, light-headedness and headaches.  Hematological: Does not bruise/bleed easily.  Psychiatric/Behavioral:  Negative for sleep disturbance. The patient is not nervous/anxious.   All other systems reviewed and are negative.    Physical Exam Updated Vital Signs BP (!) 102/58   Pulse 71   Temp (!) 97.5 F (36.4 C) (Oral)   Resp 18   Ht 5\' 3"  (1.6 m)   Wt 82.6 kg (182 lb)   SpO2 91%   BMI 32.24 kg/m   Physical Exam  Constitutional: She is oriented to person, place, and time. She appears well-developed and well-nourished. No distress.  Awake, alert, nontoxic appearance  HENT:  Head: Normocephalic and atraumatic.  Mouth/Throat: Oropharynx is clear and moist. No oropharyngeal exudate.  Eyes: Pupils are equal, round, and reactive to light. Conjunctivae and EOM are normal. No scleral icterus.  No horizontal, vertical or rotational nystagmus  Neck: Normal range of motion. Neck supple.  Full active and passive ROM without pain No midline or paraspinal tenderness No nuchal rigidity or meningeal signs  Cardiovascular: Normal rate, regular rhythm, normal heart sounds and intact distal pulses.   No murmur heard. Pulmonary/Chest: Effort normal and breath sounds normal. No respiratory distress. She has no wheezes. She has no rales.  Equal  chest expansion  Abdominal: Soft. Bowel sounds are normal. She exhibits no mass. There is no tenderness. There is no rebound and no guarding. Hernia confirmed negative in the right inguinal area and confirmed negative in the left inguinal area.  Genitourinary: Uterus normal. No labial fusion. There is no rash, tenderness or lesion on the right labia. There is no rash, tenderness or lesion on the left labia. Uterus is not deviated, not enlarged, not fixed and not tender. Cervix exhibits no motion tenderness, no discharge and no friability. Right adnexum displays no mass, no tenderness and no fullness. Left adnexum displays no mass, no tenderness and no fullness. There is bleeding ( Brisk, bright red) in the vagina. No erythema or tenderness in the vagina. No foreign body  in the vagina. No signs of injury around the vagina. No vaginal discharge found.  Musculoskeletal: Normal range of motion. She exhibits no edema.  Lymphadenopathy:    She has no cervical adenopathy.       Right: No inguinal adenopathy present.       Left: No inguinal adenopathy present.  Neurological: She is alert and oriented to person, place, and time. No cranial nerve deficit. She exhibits normal muscle tone. Coordination normal.  Mental Status:  Alert, oriented, thought content appropriate. Speech fluent without evidence of aphasia. Able to follow 2 step commands without difficulty.  Cranial Nerves:  II:  Peripheral visual fields grossly normal, pupils equal, round, reactive to light III,IV, VI: ptosis not present, extra-ocular motions intact bilaterally  V,VII: smile symmetric, facial light touch sensation equal VIII: hearing grossly normal bilaterally  IX,X: midline uvula rise  XI: bilateral shoulder shrug equal and strong XII: midline tongue extension  Motor:  5/5 in upper and lower extremities bilaterally including strong and equal grip strength and dorsiflexion/plantar flexion Sensory: Pinprick and light touch normal in all extremities.  Cerebellar: normal finger-to-nose with bilateral upper extremities Gait:  CV: distal pulses palpable throughout   Skin: Skin is warm and dry. No rash noted. She is not diaphoretic. No erythema.  Psychiatric: She has a normal mood and affect. Her behavior is normal. Judgment and thought content normal.  Nursing note and vitals reviewed.    ED Treatments / Results  Labs (all labs ordered are listed, but only abnormal results are displayed) Labs Reviewed  COMPREHENSIVE METABOLIC PANEL - Abnormal; Notable for the following:       Result Value   Sodium 129 (*)    Potassium 3.1 (*)    Chloride 94 (*)    Glucose, Bld 188 (*)    Calcium 8.3 (*)    AST 113 (*)    ALT 169 (*)    GFR calc non Af Amer 58 (*)    All other components within  normal limits  URINALYSIS, ROUTINE W REFLEX MICROSCOPIC - Abnormal; Notable for the following:    Color, Urine STRAW (*)    Glucose, UA 50 (*)    Ketones, ur 5 (*)    Leukocytes, UA MODERATE (*)    Bacteria, UA RARE (*)    All other components within normal limits  LIPASE, BLOOD  CBC   Radiology Ct Head Wo Contrast  Result Date: 04/03/2017 CLINICAL DATA:  66 year old female with dizziness, nausea vomiting. EXAM: CT HEAD WITHOUT CONTRAST TECHNIQUE: Contiguous axial images were obtained from the base of the skull through the vertex without intravenous contrast. COMPARISON:  None. FINDINGS: Brain: No evidence of acute infarction, hemorrhage, hydrocephalus, extra-axial collection or mass lesion/mass effect. Vascular: No  hyperdense vessel or unexpected calcification. Skull: Normal. Negative for fracture or focal lesion. Sinuses/Orbits: No acute finding. Other: None. IMPRESSION: No acute intracranial pathology. Electronically Signed   By: Anner Crete M.D.   On: 04/03/2017 03:37   Ct Abdomen Pelvis W Contrast  Addendum Date: 04/03/2017   ADDENDUM REPORT: 04/03/2017 04:12 ADDENDUM: Please note there is urothelial enhancement of the bladder wall and ureters. Correlation with urinalysis recommended to exclude UTI. Electronically Signed   By: Anner Crete M.D.   On: 04/03/2017 04:12   Result Date: 04/03/2017 CLINICAL DATA:  66 year old female with nausea vomiting and diarrhea. EXAM: CT ABDOMEN AND PELVIS WITH CONTRAST TECHNIQUE: Multidetector CT imaging of the abdomen and pelvis was performed using the standard protocol following bolus administration of intravenous contrast. CONTRAST:  100 cc Isovue-300 COMPARISON:  Abdominal ultrasound dated 12/28/2012 FINDINGS: Lower chest: There is a 12 mm low attenuating pleural based lesion at the right lung base which demonstrates a fatty attenuation and may represent a pulmonary hamartoma. The visualized lung bases are clear. No intra-abdominal free air or  free fluid. Hepatobiliary: There is diffuse fatty infiltration of the liver. No intrahepatic biliary ductal dilatation. The gallbladder is unremarkable. Pancreas: Unremarkable. No pancreatic ductal dilatation or surrounding inflammatory changes. Spleen: Normal in size without focal abnormality. Adrenals/Urinary Tract: The adrenal glands and kidneys appear unremarkable. There is apparent mild diffuse thickening of the bladder wall with mild urothelial enhancement of the bladder and ureters. Correlation with urinalysis recommended to exclude cystitis. Stomach/Bowel: There is a small hiatal hernia. There are scattered sigmoid diverticula without active inflammatory changes. The sigmoid colon is redundant. Thickened appearance of the descending colon most likely related to underdistention. Colitis is much less likely. Clinical correlation is recommended. There is no bowel obstruction. Normal appendix. Vascular/Lymphatic: The abdominal aorta and IVC appear unremarkable. The origins of the celiac axis, SMA, IMA and the renal arteries are patent. The SMV, splenic vein, and main portal vein are patent. No portal venous gas identified. Dilated gastric varices noted at the gastric fundus extending into the lumen of the stomach an draining into the left renal vein (gastrorenal shunt). This can predispose to upper GI bleeding. Reproductive: The uterus is anteverted. A 1.4 cm focal low attenuating prominence in the uterine fundus may represent a fibroid. Ultrasound may provide better evaluation of the pelvic structures. The ovaries are grossly unremarkable. No pelvic mass. Other: Small fat containing umbilical hernia.  No fluid collection. Musculoskeletal: No acute or significant osseous findings. IMPRESSION: 1. No bowel obstruction or active inflammation.  Normal appendix. 2. 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 The dilated short gastric veins extend into the wall and lumen of the gastric fundus and can predispose to  GI bleed. 3. Fatty liver. 4. A 12 mm pleural based nodule at the right lung base with fatty attenuation. This is incompletely characterized but may represent a pulmonary hamartoma. Direct comparison with prior CT images, if available or follow-up with CT in 3-6 months recommended. Electronically Signed: By: Anner Crete M.D. On: 04/03/2017 04:07    Procedures Procedures (including critical care time)  Medications Ordered in ED Medications  iopamidol (ISOVUE-300) 61 % injection 100 mL (100 mLs Intravenous Contrast Given 04/03/17 0330)  potassium chloride SA (K-DUR,KLOR-CON) CR tablet 40 mEq (40 mEq Oral Given 04/03/17 0530)  meclizine (ANTIVERT) tablet 50 mg (50 mg Oral Given 04/03/17 0544)     Initial Impression / Assessment and Plan / ED Course  I have reviewed the triage vital signs and  the nursing notes.  Pertinent labs & imaging results that were available during my care of the patient were reviewed by me and considered in my medical decision making (see chart for details).  Clinical Course as of Apr 03 745  Fri Apr 03, 2017  7902 Pt reports feeling almost normal with only a small amount of persistent dizziness.    [HM]  4097 Pt ambulated with steady gait to the bathroom and was preparing for discharge home when she began to experience brisk vaginal bleeding.  Pt's gown and underwear are soaked and blood is pooling on the floor.  Speculum exam reveals copious amounts of bright red blood pooling in the vaginal vault with some clotting.  No lesions to the vault or cervix are visible.    [HM]    Clinical Course User Index [HM] Kendry Pfarr, Jarrett Soho, PA-C    Patient presents to the emergency department with dizziness, nausea vomiting and diarrhea onset several hours prior to arrival. Her symptoms have improved but the time she enters the emergency department. Her abdomen is generally tender without rebound or guarding. CT of the head without acute abnormality including no intracranial  hemorrhage. Normal neurologic exam. CT of the abdomen with several incidental findings but no acute abnormalities. Gastric varices are noted along with a uterine fibroid. These abnormalities were discussed with patient at bedside and she was given a copy of her labs and CT results. She was also given meclizine for her symptoms. Patient was able to ambulate here in the emergency department without assistance and with steady gait and continuing to improve symptoms. No additional emesis. She has tolerated by mouth greater than 6 ounces of fluid without emesis.  Hypokalemia, hypochloremia and hyponatremia were noted. Patient given IV fluids and oral potassium. Elevated AST and ALT are slightly above previous and fatty liver is again seen. No right upper quadrant abdominal pain. Urinalysis with increased white blood cells from previous collection on 03/30/2017. Will give Keflex.  As patient returned from the bathroom she developed brisk and persistent vaginal bleeding. Patient reports continued feeling of discomfort in her lower abdomen but no acute or focal pain. Repeat exam her abdomen remained soft without guarding. Speculum exam with brisk bright red blood and moderate clot burden. No lacerations to the vaginal vault noted.  7:46 AM At shift change, care transferred to Domenic Moras, PA-C awaiting return phone call from Dr. Lynnette Caffey at [facility] [phone].    Final Clinical Impressions(s) / ED Diagnoses   Final diagnoses:  Hypokalemia  Non-intractable vomiting with nausea, unspecified vomiting type  Dizziness  Vaginal bleeding    New Prescriptions New Prescriptions   No medications on file     Agapito Games 04/03/17 8341    Ripley Fraise, MD 04/05/17 [phone]

## 2017-04-03 NOTE — ED Notes (Signed)
Ambulated pt in hallway. Pt able to ambulate steadily without assistance and without complaint of dizziness.

## 2017-04-03 NOTE — ED Notes (Signed)
Called [facility] @7 :10.

## 2017-04-03 NOTE — ED Provider Notes (Signed)
Abrupt brisk vaginal bleeding today in a post menopausal female.  She will need to be seen in the office today or at [facility].  Will need close monitoring of her vital sign.  She received 2 L of IVF here.    8:21 AM Appreciate consultation from GYN Dr. Charlesetta Ivory who recommend starting pt on Megace 20mg  PO QDAY, iron supplementation.  She will look through pt''s chart and will have office call pt for close f/u.    I have reassess pt.  She report improvement of bleeding while resting.  Her BP stable.  Will recheck a Hgb level.  Will give Megace.   9:38 AM hgb is stable on repeat CBC.  Pt's bleeding has improved.    BP 106/63   Pulse 60   Temp (!) 97.5 F (36.4 C) (Oral)   Resp 16   Ht 5\' 3"  (1.6 m)   Wt 82.6 kg (182 lb)   SpO2 97%   BMI 32.24 kg/m   Results for orders placed or performed during the hospital encounter of 04/02/17  Lipase, blood  Result Value Ref Range   Lipase 13 11 - 51 U/L  Comprehensive metabolic panel  Result Value Ref Range   Sodium 129 (L) 135 - 145 mmol/L   Potassium 3.1 (L) 3.5 - 5.1 mmol/L   Chloride 94 (L) 101 - 111 mmol/L   CO2 24 22 - 32 mmol/L   Glucose, Bld 188 (H) 65 - 99 mg/dL   BUN 13 6 - 20 mg/dL   Creatinine, Ser 1.00 0.44 - 1.00 mg/dL   Calcium 8.3 (L) 8.9 - 10.3 mg/dL   Total Protein 7.1 6.5 - 8.1 g/dL   Albumin 3.8 3.5 - 5.0 g/dL   AST 113 (H) 15 - 41 U/L   ALT 169 (H) 14 - 54 U/L   Alkaline Phosphatase 42 38 - 126 U/L   Total Bilirubin 0.7 0.3 - 1.2 mg/dL   GFR calc non Af Amer 58 (L) >60 mL/min   GFR calc Af Amer >60 >60 mL/min   Anion gap 11 5 - 15  CBC  Result Value Ref Range   WBC 7.1 4.0 - 10.5 K/uL   RBC 3.99 3.87 - 5.11 MIL/uL   Hemoglobin 12.9 12.0 - 15.0 g/dL   HCT 36.1 36.0 - 46.0 %   MCV 90.5 78.0 - 100.0 fL   MCH 32.3 26.0 - 34.0 pg   MCHC 35.7 30.0 - 36.0 g/dL   RDW 12.3 11.5 - 15.5 %   Platelets 171 150 - 400 K/uL  Urinalysis, Routine w reflex microscopic  Result Value Ref Range   Color, Urine STRAW  (A) YELLOW   APPearance CLEAR CLEAR   Specific Gravity, Urine 1.008 1.005 - 1.030   pH 7.0 5.0 - 8.0   Glucose, UA 50 (A) NEGATIVE mg/dL   Hgb urine dipstick NEGATIVE NEGATIVE   Bilirubin Urine NEGATIVE NEGATIVE   Ketones, ur 5 (A) NEGATIVE mg/dL   Protein, ur NEGATIVE NEGATIVE mg/dL   Nitrite NEGATIVE NEGATIVE   Leukocytes, UA MODERATE (A) NEGATIVE   RBC / HPF 0-5 0 - 5 RBC/hpf   WBC, UA 6-30 0 - 5 WBC/hpf   Bacteria, UA RARE (A) NONE SEEN   Squamous Epithelial / LPF NONE SEEN NONE SEEN   Mucous PRESENT   CBC  Result Value Ref Range   WBC 5.9 4.0 - 10.5 K/uL   RBC 3.77 (L) 3.87 - 5.11 MIL/uL   Hemoglobin 12.1 12.0 -  15.0 g/dL   HCT 34.6 (L) 36.0 - 46.0 %   MCV 91.8 78.0 - 100.0 fL   MCH 32.1 26.0 - 34.0 pg   MCHC 35.0 30.0 - 36.0 g/dL   RDW 12.6 11.5 - 15.5 %   Platelets 195 150 - 400 K/uL   Ct Head Wo Contrast  Result Date: 04/03/2017 CLINICAL DATA:  66 year old female with dizziness, nausea vomiting. EXAM: CT HEAD WITHOUT CONTRAST TECHNIQUE: Contiguous axial images were obtained from the base of the skull through the vertex without intravenous contrast. COMPARISON:  None. FINDINGS: Brain: No evidence of acute infarction, hemorrhage, hydrocephalus, extra-axial collection or mass lesion/mass effect. Vascular: No hyperdense vessel or unexpected calcification. Skull: Normal. Negative for fracture or focal lesion. Sinuses/Orbits: No acute finding. Other: None. IMPRESSION: No acute intracranial pathology. Electronically Signed   By: Anner Crete M.D.   On: 04/03/2017 03:37   Ct Abdomen Pelvis W Contrast  Addendum Date: 04/03/2017   ADDENDUM REPORT: 04/03/2017 04:12 ADDENDUM: Please note there is urothelial enhancement of the bladder wall and ureters. Correlation with urinalysis recommended to exclude UTI. Electronically Signed   By: Anner Crete M.D.   On: 04/03/2017 04:12   Result Date: 04/03/2017 CLINICAL DATA:  66 year old female with nausea vomiting and diarrhea. EXAM: CT  ABDOMEN AND PELVIS WITH CONTRAST TECHNIQUE: Multidetector CT imaging of the abdomen and pelvis was performed using the standard protocol following bolus administration of intravenous contrast. CONTRAST:  100 cc Isovue-300 COMPARISON:  Abdominal ultrasound dated 12/28/2012 FINDINGS: Lower chest: There is a 12 mm low attenuating pleural based lesion at the right lung base which demonstrates a fatty attenuation and may represent a pulmonary hamartoma. The visualized lung bases are clear. No intra-abdominal free air or free fluid. Hepatobiliary: There is diffuse fatty infiltration of the liver. No intrahepatic biliary ductal dilatation. The gallbladder is unremarkable. Pancreas: Unremarkable. No pancreatic ductal dilatation or surrounding inflammatory changes. Spleen: Normal in size without focal abnormality. Adrenals/Urinary Tract: The adrenal glands and kidneys appear unremarkable. There is apparent mild diffuse thickening of the bladder wall with mild urothelial enhancement of the bladder and ureters. Correlation with urinalysis recommended to exclude cystitis. Stomach/Bowel: There is a small hiatal hernia. There are scattered sigmoid diverticula without active inflammatory changes. The sigmoid colon is redundant. Thickened appearance of the descending colon most likely related to underdistention. Colitis is much less likely. Clinical correlation is recommended. There is no bowel obstruction. Normal appendix. Vascular/Lymphatic: The abdominal aorta and IVC appear unremarkable. The origins of the celiac axis, SMA, IMA and the renal arteries are patent. The SMV, splenic vein, and main portal vein are patent. No portal venous gas identified. Dilated gastric varices noted at the gastric fundus extending into the lumen of the stomach an draining into the left renal vein (gastrorenal shunt). This can predispose to upper GI bleeding. Reproductive: The uterus is anteverted. A 1.4 cm focal low attenuating prominence in the  uterine fundus may represent a fibroid. Ultrasound may provide better evaluation of the pelvic structures. The ovaries are grossly unremarkable. No pelvic mass. Other: Small fat containing umbilical hernia.  No fluid collection. Musculoskeletal: No acute or significant osseous findings. IMPRESSION: 1. No bowel obstruction or active inflammation.  Normal appendix. 2. 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 The dilated short gastric veins extend into the wall and lumen of the gastric fundus and can predispose to GI bleed. 3. Fatty liver. 4. A 12 mm pleural based nodule at the right lung base with fatty attenuation. This is  incompletely characterized but may represent a pulmonary hamartoma. Direct comparison with prior CT images, if available or follow-up with CT in 3-6 months recommended. Electronically Signed: By: Anner Crete M.D. On: 04/03/2017 04:07      Domenic Moras, PA-C 04/03/17 4163    Duffy Bruce, MD 04/04/17 [phone]

## 2017-04-05 NOTE — Telephone Encounter (Signed)
We need to get her in with GYN. Does she have a gyn? Or we can refer her  Down the hall.

## 2017-04-06 NOTE — Telephone Encounter (Signed)
Patient is going to [facility]. See Urine Culture results for more documentation.

## 2017-04-13 DIAGNOSIS — N950 Postmenopausal bleeding: Secondary | ICD-10-CM | POA: Diagnosis not present

## 2017-04-13 DIAGNOSIS — R319 Hematuria, unspecified: Secondary | ICD-10-CM | POA: Diagnosis not present

## 2017-04-15 DIAGNOSIS — C55 Malignant neoplasm of uterus, part unspecified: Secondary | ICD-10-CM

## 2017-04-15 DIAGNOSIS — C50211 Malignant neoplasm of upper-inner quadrant of right female breast: Secondary | ICD-10-CM

## 2017-04-15 HISTORY — DX: Malignant neoplasm of uterus, part unspecified: C55

## 2017-04-15 HISTORY — DX: Malignant neoplasm of upper-inner quadrant of right female breast: C50.211

## 2017-04-16 ENCOUNTER — Other Ambulatory Visit (HOSPITAL_COMMUNITY): Payer: Self-pay | Admitting: Obstetrics & Gynecology

## 2017-04-16 DIAGNOSIS — R911 Solitary pulmonary nodule: Secondary | ICD-10-CM

## 2017-04-20 DIAGNOSIS — Z1231 Encounter for screening mammogram for malignant neoplasm of breast: Secondary | ICD-10-CM | POA: Diagnosis not present

## 2017-04-22 DIAGNOSIS — H1851 Endothelial corneal dystrophy: Secondary | ICD-10-CM | POA: Diagnosis not present

## 2017-04-22 DIAGNOSIS — N6312 Unspecified lump in the right breast, upper inner quadrant: Secondary | ICD-10-CM | POA: Diagnosis not present

## 2017-04-22 DIAGNOSIS — H43813 Vitreous degeneration, bilateral: Secondary | ICD-10-CM | POA: Diagnosis not present

## 2017-04-22 DIAGNOSIS — H25813 Combined forms of age-related cataract, bilateral: Secondary | ICD-10-CM | POA: Diagnosis not present

## 2017-04-24 ENCOUNTER — Inpatient Hospital Stay (HOSPITAL_COMMUNITY)
Admission: RE | Admit: 2017-04-24 | Discharge: 2017-04-24 | Disposition: A | Discharge status: Home / Self Care | Payer: 59 | Source: Ambulatory Visit

## 2017-04-27 ENCOUNTER — Other Ambulatory Visit: Payer: Self-pay | Admitting: Radiology

## 2017-04-27 DIAGNOSIS — C50211 Malignant neoplasm of upper-inner quadrant of right female breast: Secondary | ICD-10-CM | POA: Diagnosis not present

## 2017-04-27 DIAGNOSIS — N950 Postmenopausal bleeding: Secondary | ICD-10-CM | POA: Diagnosis not present

## 2017-04-27 HISTORY — PX: BREAST BIOPSY: SHX20

## 2017-04-27 NOTE — Patient Instructions (Addendum)
Your procedure is scheduled on: Wednesday April 29, 2017   Enter through the [employer] [facility] at: 11:30 am  Pick up the phone at the desk and dial [phone].  Call this number if you have problems the morning of surgery: [phone].  Remember: Do NOT eat food: after Midnight on Tuesday August 14  Do NOT drink clear liquids after: 7 am day of surgery  Take these medicines the morning of surgery with a SIP OF WATER:  Benicar, Raloxifene  Do NOT wear jewelry (body piercing), metal hair clips/bobby pins, make-up, or nail polish. Do NOT wear lotions, powders, or perfumes.  You may wear deoderant. Do NOT shave for 48 hours prior to surgery. Do NOT bring valuables to the hospital. Contacts, dentures, or bridgework may not be worn into surgery.  Have a responsible adult drive you home and stay with you for 24 hours after your procedure

## 2017-04-28 ENCOUNTER — Encounter (HOSPITAL_COMMUNITY): Payer: Self-pay

## 2017-04-28 ENCOUNTER — Encounter (HOSPITAL_COMMUNITY)
Admission: RE | Admit: 2017-04-28 | Discharge: 2017-04-28 | Disposition: A | Discharge status: Home / Self Care | Payer: 59 | Source: Ambulatory Visit | Attending: Obstetrics & Gynecology | Admitting: Obstetrics & Gynecology

## 2017-04-28 ENCOUNTER — Other Ambulatory Visit: Payer: Self-pay

## 2017-04-28 DIAGNOSIS — K589 Irritable bowel syndrome without diarrhea: Secondary | ICD-10-CM | POA: Diagnosis not present

## 2017-04-28 DIAGNOSIS — M1990 Unspecified osteoarthritis, unspecified site: Secondary | ICD-10-CM | POA: Diagnosis not present

## 2017-04-28 DIAGNOSIS — E785 Hyperlipidemia, unspecified: Secondary | ICD-10-CM | POA: Diagnosis not present

## 2017-04-28 DIAGNOSIS — N950 Postmenopausal bleeding: Secondary | ICD-10-CM | POA: Diagnosis not present

## 2017-04-28 DIAGNOSIS — R7303 Prediabetes: Secondary | ICD-10-CM | POA: Diagnosis not present

## 2017-04-28 DIAGNOSIS — Z880 Allergy status to penicillin: Secondary | ICD-10-CM | POA: Diagnosis not present

## 2017-04-28 DIAGNOSIS — I10 Essential (primary) hypertension: Secondary | ICD-10-CM | POA: Diagnosis not present

## 2017-04-28 HISTORY — DX: Endothelial corneal dystrophy, unspecified eye: H18.519

## 2017-04-28 HISTORY — DX: Gastro-esophageal reflux disease without esophagitis: K21.9

## 2017-04-28 HISTORY — DX: Dyspnea, unspecified: R06.00

## 2017-04-28 HISTORY — DX: Fatty (change of) liver, not elsewhere classified: K76.0

## 2017-04-28 HISTORY — DX: Cramp and spasm: R25.2

## 2017-04-28 HISTORY — DX: Dizziness and giddiness: R42

## 2017-04-28 HISTORY — DX: Endothelial corneal dystrophy: H18.51

## 2017-04-28 HISTORY — DX: Unspecified cataract: H26.9

## 2017-04-28 HISTORY — DX: Personal history of other diseases of the digestive system: Z87.19

## 2017-04-28 HISTORY — DX: Respiratory tuberculosis unspecified: A15.9

## 2017-04-28 LAB — BASIC METABOLIC PANEL
Anion gap: 9 (ref 5–15)
BUN: 12 mg/dL (ref 6–20)
CHLORIDE: 103 mmol/L (ref 101–111)
CO2: 24 mmol/L (ref 22–32)
CREATININE: 0.74 mg/dL (ref 0.44–1.00)
Calcium: 9.3 mg/dL (ref 8.9–10.3)
GFR calc non Af Amer: >60 mL/min (ref >60)
GLUCOSE: 89 mg/dL (ref 65–99)
Potassium: 3.4 mmol/L — ABNORMAL LOW (ref 3.5–5.1)
Sodium: 136 mmol/L (ref 135–145)

## 2017-04-28 LAB — CBC
HEMATOCRIT: 38 % (ref 36.0–46.0)
HEMOGLOBIN: 13.5 g/dL (ref 12.0–15.0)
MCH: 33.4 pg (ref 26.0–34.0)
MCHC: 35.5 g/dL (ref 30.0–36.0)
MCV: 94.1 fL (ref 78.0–100.0)
Platelets: 216 10*3/uL (ref 150–400)
RBC: 4.04 MIL/uL (ref 3.87–5.11)
RDW: 12.9 % (ref 11.5–15.5)
WBC: 7.1 10*3/uL (ref 4.0–10.5)

## 2017-04-28 NOTE — H&P (Signed)
Dawn [name] is an 66 y.o. female with postmenopausal bleeding.  Pelvic ultrasound shows thickened endometrial stripe at 64mm.  The patient is on no HRT and described the bleeding as heavy like her periods used to be. This has been occurring x 2 months.   Pertinent Gynecological History: Menses: flow is moderate Bleeding: post menopausal bleeding Contraception: post menopausal status DES exposure: unknown Blood transfusions: none Sexually transmitted diseases: no past history Previous GYN Procedures: none  Last mammogram: abnormal: right breast mass biopsied 2 days ago Date: 2018 Last pap: normal Date: 2 OB History: G2, P2   Menstrual History: Menarche age: n/a No LMP recorded. Patient is postmenopausal.    Past Medical History:  Diagnosis Date  . Anemia    as a child.  . Arthritis   . Bilateral cataracts   . Bilateral leg cramps   . Cancer ([place])    skin  . Dyspnea   . Fuchs' corneal dystrophy   . GERD (gastroesophageal reflux disease)   . Hearing loss    Right side 30%  . Heart murmur    childhood  . History of hiatal hernia    small size  . Hyperlipidemia   . Hypertension   . IBS (irritable bowel syndrome)   . NAFL (nonalcoholic fatty liver)   . Obesity   . Pre-diabetes   . 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 Vertigo     Past Surgical History:  Procedure Laterality Date  . BREAST BIOPSY Right 04/27/2017  . COLONOSCOPY      Family History  Problem Relation Age of Onset  . Cancer Father        died of bladder cancer  . Colon cancer Father 7  . Heart attack Father 65  . Stroke Mother   . Diabetes Maternal Grandfather   . Kidney disease Maternal Grandfather   . Asthma Brother     Social History:  reports that she has never smoked. She has never used smokeless tobacco. She reports that she drinks alcohol. She reports that she does not use drugs.  Allergies:  Allergies  Allergen Reactions  . Crestor [Rosuvastatin  Calcium] Other (See Comments)    Myalgias   . Penicillins Rash    Has patient had a PCN reaction causing immediate rash, facial/tongue/throat swelling, SOB or lightheadedness with hypotension: Yes Has patient had a PCN reaction causing severe rash involving mucus membranes or skin necrosis: Yes Has patient had a PCN reaction that required hospitalization: No Has patient had a PCN reaction occurring within the last 10 years: No If all of the above answers are "NO", then may proceed with Cephalosporin use.     No prescriptions prior to admission.    ROS  There were no vitals taken for this visit. Physical Exam  Constitutional: She is oriented to person, place, and time. She appears well-developed and well-nourished.  GI: Soft. There is no tenderness. There is no rebound.  Neurological: She is alert and oriented to person, place, and time.  Skin: Skin is warm and dry.  Psychiatric: She has a normal mood and affect. Her behavior is normal.    Results for orders placed or performed during the hospital encounter of 04/28/17 (from the past 24 hour(s))  CBC     Status: None   Collection Time: 04/28/17 11:35 AM  Result Value Ref Range   WBC 7.1 4.0 - 10.5 K/uL   RBC 4.04 3.87 - 5.11 MIL/uL  Hemoglobin 13.5 12.0 - 15.0 g/dL   HCT 38.0 36.0 - 46.0 %   MCV 94.1 78.0 - 100.0 fL   MCH 33.4 26.0 - 34.0 pg   MCHC 35.5 30.0 - 36.0 g/dL   RDW 12.9 11.5 - 15.5 %   Platelets 216 150 - 400 K/uL  Basic metabolic panel     Status: Abnormal   Collection Time: 04/28/17 11:35 AM  Result Value Ref Range   Sodium 136 135 - 145 mmol/L   Potassium 3.4 (L) 3.5 - 5.1 mmol/L   Chloride 103 101 - 111 mmol/L   CO2 24 22 - 32 mmol/L   Glucose, Bld 89 65 - 99 mg/dL   BUN 12 6 - 20 mg/dL   Creatinine, Ser 0.74 0.44 - 1.00 mg/dL   Calcium 9.3 8.9 - 10.3 mg/dL   GFR calc non Af Amer >60 >60 mL/min   GFR calc Af Amer >60 >60 mL/min   Anion gap 9 5 - 15    No results found.  Assessment/Plan: 66yo  with postmenopausal bleeding -H/S, D&C -Patient is counseled re: risk of bleeding, infection, scarring, and damage to surrounding structures.  All questions were answered and the patient wishes to proceed.  Dawn [name], [place] 04/28/2017, 9:39 PM

## 2017-04-28 NOTE — Pre-Procedure Instructions (Signed)
Dr. Ambrose Pancoast reviewed EKG and interviewed Dawn [name] no new orders received at this time.

## 2017-04-29 ENCOUNTER — Ambulatory Visit (HOSPITAL_COMMUNITY)
Admission: RE | Admit: 2017-04-29 | Discharge: 2017-04-29 | Disposition: A | Discharge status: Home / Self Care | Payer: 59 | Source: Ambulatory Visit | Attending: Obstetrics & Gynecology | Admitting: Obstetrics & Gynecology

## 2017-04-29 ENCOUNTER — Encounter (HOSPITAL_COMMUNITY): Payer: Self-pay

## 2017-04-29 ENCOUNTER — Encounter (HOSPITAL_COMMUNITY)
Admission: RE | Disposition: A | Discharge status: Home / Self Care | Payer: Self-pay | Source: Ambulatory Visit | Attending: Obstetrics & Gynecology

## 2017-04-29 ENCOUNTER — Ambulatory Visit (HOSPITAL_COMMUNITY): Payer: 59 | Admitting: Certified Registered Nurse Anesthetist

## 2017-04-29 DIAGNOSIS — I10 Essential (primary) hypertension: Secondary | ICD-10-CM | POA: Diagnosis not present

## 2017-04-29 DIAGNOSIS — K589 Irritable bowel syndrome without diarrhea: Secondary | ICD-10-CM | POA: Insufficient documentation

## 2017-04-29 DIAGNOSIS — R7303 Prediabetes: Secondary | ICD-10-CM | POA: Insufficient documentation

## 2017-04-29 DIAGNOSIS — E785 Hyperlipidemia, unspecified: Secondary | ICD-10-CM | POA: Diagnosis not present

## 2017-04-29 DIAGNOSIS — N950 Postmenopausal bleeding: Secondary | ICD-10-CM | POA: Diagnosis not present

## 2017-04-29 DIAGNOSIS — M1990 Unspecified osteoarthritis, unspecified site: Secondary | ICD-10-CM | POA: Insufficient documentation

## 2017-04-29 DIAGNOSIS — Z880 Allergy status to penicillin: Secondary | ICD-10-CM | POA: Diagnosis not present

## 2017-04-29 DIAGNOSIS — K5730 Diverticulosis of large intestine without perforation or abscess without bleeding: Secondary | ICD-10-CM | POA: Diagnosis not present

## 2017-04-29 DIAGNOSIS — C541 Malignant neoplasm of endometrium: Secondary | ICD-10-CM | POA: Diagnosis not present

## 2017-04-29 HISTORY — PX: HYSTEROSCOPY WITH D & C: SHX1775

## 2017-04-29 SURGERY — DILATATION AND CURETTAGE /HYSTEROSCOPY
Anesthesia: General | Site: Vagina

## 2017-04-29 MED ORDER — IBUPROFEN 600 MG PO TABS: 600.0000 mg | ORAL_TABLET | Freq: Four times a day (QID) | ORAL | 0 refills | Status: DC | PRN

## 2017-04-29 MED ORDER — ACETAMINOPHEN 160 MG/5ML PO SOLN: 325.0000 mg | ORAL | Status: DC | PRN

## 2017-04-29 MED ORDER — SODIUM CHLORIDE 0.9 % IR SOLN
Status: DC | PRN
  Administered 2017-04-29: 3000 mL

## 2017-04-29 MED ORDER — FENTANYL CITRATE (PF) 100 MCG/2ML IJ SOLN
25.0000 ug | INTRAMUSCULAR | Status: DC | PRN
  Administered 2017-04-29: 50 ug via INTRAVENOUS

## 2017-04-29 MED ORDER — OXYCODONE HCL 5 MG/5ML PO SOLN: 5.0000 mg | Freq: Once | ORAL | Status: DC | PRN

## 2017-04-29 MED ORDER — FENTANYL CITRATE (PF) 100 MCG/2ML IJ SOLN
INTRAMUSCULAR | Status: AC
  Filled 2017-04-29: qty 2

## 2017-04-29 MED ORDER — OXYCODONE HCL 5 MG PO TABS: 5.0000 mg | ORAL_TABLET | Freq: Once | ORAL | Status: DC | PRN

## 2017-04-29 MED ORDER — PHENYLEPHRINE 40 MCG/ML (10ML) SYRINGE FOR IV PUSH (FOR BLOOD PRESSURE SUPPORT)
PREFILLED_SYRINGE | INTRAVENOUS | Status: AC
  Filled 2017-04-29: qty 10

## 2017-04-29 MED ORDER — ACETAMINOPHEN 325 MG PO TABS: 325.0000 mg | ORAL_TABLET | ORAL | Status: DC | PRN

## 2017-04-29 MED ORDER — MEPERIDINE HCL 25 MG/ML IJ SOLN: 6.2500 mg | INTRAMUSCULAR | Status: DC | PRN

## 2017-04-29 MED ORDER — DEXAMETHASONE SODIUM PHOSPHATE 4 MG/ML IJ SOLN
INTRAMUSCULAR | Status: AC
  Filled 2017-04-29: qty 1

## 2017-04-29 MED ORDER — OXYCODONE-ACETAMINOPHEN 5-325 MG PO TABS: 1.0000 | ORAL_TABLET | ORAL | 0 refills | Status: DC | PRN

## 2017-04-29 MED ORDER — LACTATED RINGERS IV SOLN
INTRAVENOUS | Status: DC
  Administered 2017-04-29 (×2): via INTRAVENOUS

## 2017-04-29 MED ORDER — ONDANSETRON HCL 4 MG/2ML IJ SOLN
INTRAMUSCULAR | Status: AC
  Filled 2017-04-29: qty 2

## 2017-04-29 MED ORDER — MIDAZOLAM HCL 2 MG/2ML IJ SOLN
INTRAMUSCULAR | Status: AC
  Filled 2017-04-29: qty 2

## 2017-04-29 MED ORDER — ONDANSETRON HCL 4 MG/2ML IJ SOLN
INTRAMUSCULAR | Status: DC | PRN
  Administered 2017-04-29: 4 mg via INTRAVENOUS

## 2017-04-29 MED ORDER — LIDOCAINE HCL (CARDIAC) 20 MG/ML IV SOLN
INTRAVENOUS | Status: DC | PRN
  Administered 2017-04-29: 50 mg via INTRAVENOUS

## 2017-04-29 MED ORDER — KETOROLAC TROMETHAMINE 30 MG/ML IJ SOLN
INTRAMUSCULAR | Status: DC | PRN
  Administered 2017-04-29: 30 mg via INTRAVENOUS

## 2017-04-29 MED ORDER — MIDAZOLAM HCL 2 MG/2ML IJ SOLN
INTRAMUSCULAR | Status: DC | PRN
Start: 2017-04-29 — End: 2017-04-29
  Administered 2017-04-29 (×2): 1 mg via INTRAVENOUS

## 2017-04-29 MED ORDER — PROPOFOL 10 MG/ML IV BOLUS
INTRAVENOUS | Status: DC | PRN
  Administered 2017-04-29: 150 mg via INTRAVENOUS

## 2017-04-29 MED ORDER — FENTANYL CITRATE (PF) 100 MCG/2ML IJ SOLN
INTRAMUSCULAR | Status: DC | PRN
  Administered 2017-04-29: 50 ug via INTRAVENOUS
  Administered 2017-04-29: 25 ug via INTRAVENOUS

## 2017-04-29 MED ORDER — PROPOFOL 10 MG/ML IV BOLUS
INTRAVENOUS | Status: AC
  Filled 2017-04-29: qty 20

## 2017-04-29 MED ORDER — ONDANSETRON HCL 4 MG/2ML IJ SOLN: 4.0000 mg | Freq: Once | INTRAMUSCULAR | Status: DC | PRN

## 2017-04-29 MED ORDER — PHENYLEPHRINE HCL 10 MG/ML IJ SOLN
INTRAMUSCULAR | Status: DC | PRN
  Administered 2017-04-29 (×2): .08 ug via INTRAVENOUS

## 2017-04-29 MED ORDER — DEXAMETHASONE SODIUM PHOSPHATE 10 MG/ML IJ SOLN
INTRAMUSCULAR | Status: DC | PRN
  Administered 2017-04-29: 4 mg via INTRAVENOUS

## 2017-04-29 MED FILL — OXYCODONE W/APAP 5/325 TAB: 5-325 | 2 days supply | Qty: 10 | Fill #0

## 2017-04-29 SURGICAL SUPPLY — 21 items
ABLATOR ENDOMETRIAL BIPOLAR (ABLATOR) IMPLANT
BIPOLAR CUTTING LOOP 21FR (ELECTRODE)
CANISTER SUCT 3000ML PPV (MISCELLANEOUS) ×2 IMPLANT
CATH ROBINSON RED A/P 16FR (CATHETERS) ×2 IMPLANT
CLOTH BEACON ORANGE TIMEOUT ST (SAFETY) ×2 IMPLANT
CONTAINER PREFILL 10% NBF 60ML (FORM) ×2 IMPLANT
ELECT COAG BIPOL BALL 21FR (ELECTRODE) IMPLANT
ELECT REM PT RETURN 9FT ADLT (ELECTROSURGICAL) ×2
ELECTRODE REM PT RTRN 9FT ADLT (ELECTROSURGICAL) ×1 IMPLANT
GLOVE BIO SURGEON STRL SZ 6 (GLOVE) ×2 IMPLANT
GLOVE BIOGEL PI IND STRL 6 (GLOVE) ×2 IMPLANT
GLOVE BIOGEL PI IND STRL 7.0 (GLOVE) ×1 IMPLANT
GLOVE BIOGEL PI INDICATOR 6 (GLOVE) ×2
GLOVE BIOGEL PI INDICATOR 7.0 (GLOVE) ×1
GOWN STRL REUS W/TWL LRG LVL3 (GOWN DISPOSABLE) ×4 IMPLANT
LOOP CUTTING BIPOLAR 21FR (ELECTRODE) IMPLANT
PACK VAGINAL MINOR WOMEN LF (CUSTOM PROCEDURE TRAY) ×2 IMPLANT
PAD OB MATERNITY 4.3X12.25 (PERSONAL CARE ITEMS) ×2 IMPLANT
TOWEL OR 17X24 6PK STRL BLUE (TOWEL DISPOSABLE) ×4 IMPLANT
TUBING AQUILEX INFLOW (TUBING) ×2 IMPLANT
TUBING AQUILEX OUTFLOW (TUBING) ×2 IMPLANT

## 2017-04-29 NOTE — Anesthesia Preprocedure Evaluation (Addendum)
Anesthesia Evaluation  Patient identified by MRN, date of birth, ID band Patient awake    Reviewed: Allergy & Precautions, NPO status , Patient's Chart, lab work & pertinent test results  Airway Mallampati: II       Dental no notable dental hx. (+) Teeth Intact   Pulmonary    Pulmonary exam normal breath sounds clear to auscultation       Cardiovascular hypertension, Normal cardiovascular exam Rhythm:Regular Rate:Normal     Neuro/Psych negative neurological ROS  negative psych ROS   GI/Hepatic   Endo/Other    Renal/GU   negative genitourinary   Musculoskeletal   Abdominal (+) + obese,   Peds  Hematology   Anesthesia Other Findings   Reproductive/Obstetrics                             Anesthesia Physical Anesthesia Plan  ASA: II  Anesthesia Plan: General   Post-op Pain Management:    Induction:   PONV Risk Score and Plan: 3 and Ondansetron, Dexamethasone, Midazolam, Propofol infusion and Treatment may vary due to age or medical condition  Airway Management Planned: LMA  Additional Equipment:   Intra-op Plan:   Post-operative Plan:   Informed Consent: I have reviewed the patients History and Physical, chart, labs and discussed the procedure including the risks, benefits and alternatives for the proposed anesthesia with the patient or authorized representative who has indicated his/her understanding and acceptance.     Plan Discussed with: Surgeon and CRNA  Anesthesia Plan Comments:        Anesthesia Quick Evaluation

## 2017-04-29 NOTE — Discharge Instructions (Addendum)
Call MD for T>100.4, heavy vaginal bleeding, severe abdominal pain, intractable nausea and/or vomiting or respiratory distress.  Call office to schedule postop appointment in 2 weeks.  No driving while taking narcotics.  Pelvic rest x 4 weeks.    DISCHARGE INSTRUCTIONS: HYSTEROSCOPY / ENDOMETRIAL ABLATION The following instructions have been prepared to help you care for yourself upon your return home.    May take Ibuprofen after: 7:45 pm   May take stool softner while taking narcotic pain medication to prevent constipation.  Drink plenty of water.  Personal hygiene:  Use sanitary pads for vaginal drainage, not tampons.  Shower the day after your procedure.  NO tub baths, pools or Jacuzzis for 2-3 weeks.  Wipe front to back after using the bathroom.  Activity and limitations:  Do NOT drive or operate any equipment for 24 hours. The effects of anesthesia are still present and drowsiness may result.  Do NOT rest in bed all day.  Walking is encouraged.  Walk up and down stairs slowly.  You may resume your normal activity in one to two days or as indicated by your physician. Sexual activity: NO intercourse for at least 2 weeks after the procedure, or as indicated by your Doctor.  Diet: Eat a light meal as desired this evening. You may resume your usual diet tomorrow.  Return to Work: You may resume your work activities in one to two days or as indicated by Marine scientist.  What to expect after your surgery: Expect to have vaginal bleeding/discharge for 2-3 days and spotting for up to 10 days. It is not unusual to have soreness for up to 1-2 weeks. You may have a slight burning sensation when you urinate for the first day. Mild cramps may continue for a couple of days. You may have a regular period in 2-6 weeks.  Call your doctor for any of the following:  Excessive vaginal bleeding or clotting, saturating and changing one pad every hour.  Inability to urinate 6 hours after  discharge from hospital.  Pain not relieved by pain medication.  Fever of 100.4 F or greater.  Unusual vaginal discharge or odor.  Return to office _________________Call for an appointment ___________________ Patients signature: ______________________ Nurses signature ________________________  Post Anesthesia Care Unit [phone]  Post Anesthesia Home Care Instructions  Activity: Get plenty of rest for the remainder of the day. A responsible individual must stay with you for 24 hours following the procedure.  For the next 24 hours, DO NOT: -Drive a car -Paediatric nurse -Drink alcoholic beverages -Take any medication unless instructed by your physician -Make any legal decisions or sign important papers.  Meals: Start with liquid foods such as gelatin or soup. Progress to regular foods as tolerated. Avoid greasy, spicy, heavy foods. If nausea and/or vomiting occur, drink only clear liquids until the nausea and/or vomiting subsides. Call your physician if vomiting continues.  Special Instructions/Symptoms: Your throat may feel dry or sore from the anesthesia or the breathing tube placed in your throat during surgery. If this causes discomfort, gargle with warm salt water. The discomfort should disappear within 24 hours.

## 2017-04-29 NOTE — Op Note (Signed)
PREOPERATIVE DIAGNOSIS:  66 y.o. with postmenopausal bleeding  POSTOPERATIVE DIAGNOSIS: The same  PROCEDURE: Hysteroscopy, Dilation and Curettage.  SURGEON:  Dr. Linda Hedges  INDICATIONS: 66 y.o. here for scheduled surgery for postmenopausal bleeding.   Risks of surgery were discussed with the patient including but not limited to: bleeding which may require transfusion; infection which may require antibiotics; injury to uterus or surrounding organs; intrauterine scarring which may impair future fertility; need for additional procedures including laparotomy or laparoscopy; and other postoperative/anesthesia complications. Written informed consent was obtained.    FINDINGS:  A 6 week size uterus.  Thickened endometrium on posterior uterine wall.  ANESTHESIA:   General  ESTIMATED BLOOD LOSS:  Less than 20 ml  SPECIMENS: Endometrial curettings sent to pathology  COMPLICATIONS:  None immediate.  PROCEDURE DETAILS:  The patient received intravenous antibiotics while in the preoperative area.  She was then taken to the operating room where general anesthesia was administered and was found to be adequate.  After an adequate timeout was performed, she was placed in the dorsal lithotomy position and examined; then prepped and draped in the sterile manner.   Her bladder was catheterized for an unmeasured amount of clear, yellow urine. A speculum was then placed in the patient's vagina and a single tooth tenaculum was applied to the anterior lip of the cervix. The uterus was sounded to 8 cm and dilated manually with metal dilators to accommodate the 5.5 mm diagnostic hysteroscope.  Once the cervix was dilated, the hysteroscope was inserted under direct visualization using saline as a suspension medium.  The uterine cavity was carefully examined with the above dictated findings.  After further careful visualization of the uterine cavity, the hysteroscope was removed under direct visualization.  A sharp  curettage was then performed to obtain a moderate amount of endometrial curettings.  The tenaculum was removed from the anterior lip of the cervix and the vaginal speculum was removed after noting good hemostasis.  The patient tolerated the procedure well and was taken to the recovery area awake, extubated and in stable condition.

## 2017-04-29 NOTE — Anesthesia Postprocedure Evaluation (Signed)
Anesthesia Post Note  Patient: Dawn [name]  Procedure(s) Performed: Procedure(s) (LRB): DILATATION AND CURETTAGE /HYSTEROSCOPY (N/A)     Patient location during evaluation: PACU Anesthesia Type: General Level of consciousness: awake Pain management: pain level controlled Vital Signs Assessment: post-procedure vital signs reviewed and stable Respiratory status: spontaneous breathing Cardiovascular status: stable Postop Assessment: no signs of nausea or vomiting Anesthetic complications: no    Last Vitals:  Vitals:   04/29/17 1152 04/29/17 1352  BP: 112/85 110/62  Pulse: 77 80  Resp: 18 16  Temp: (!) 36.4 C 36.6 C  SpO2: 98% 100%    Last Pain:  Vitals:   04/29/17 1415  TempSrc:   PainSc: 0-No pain   Pain Goal: Patients Stated Pain Goal: 5 (04/29/17 1415)               Chancellor

## 2017-04-29 NOTE — Anesthesia Procedure Notes (Signed)
Procedure Name: LMA Insertion Date/Time: 04/29/2017 1:21 PM Performed by: Bufford Spikes Pre-anesthesia Checklist: Patient identified, Emergency Drugs available, Suction available and Patient being monitored Patient Re-evaluated:Patient Re-evaluated prior to induction Oxygen Delivery Method: Circle system utilized Preoxygenation: Pre-oxygenation with 100% oxygen Induction Type: IV induction Ventilation: Mask ventilation without difficulty LMA: LMA inserted LMA Size: 4.0 Number of attempts: 1 Placement Confirmation: positive ETCO2 Tube secured with: Tape Dental Injury: Teeth and Oropharynx as per pre-operative assessment

## 2017-04-29 NOTE — Transfer of Care (Signed)
Immediate Anesthesia Transfer of Care Note  Patient: Dawn [name]  Procedure(s) Performed: Procedure(s): DILATATION AND CURETTAGE /HYSTEROSCOPY (N/A)  Patient Location: PACU  Anesthesia Type:General  Level of Consciousness: awake, alert  and oriented  Airway & Oxygen Therapy: Patient Spontanous Breathing and Patient connected to nasal cannula oxygen  Post-op Assessment: Report given to RN and Post -op Vital signs reviewed and stable  Post vital signs: Reviewed and stable  Last Vitals:  Vitals:   04/29/17 1152  BP: 112/85  Pulse: 77  Resp: 18  Temp: (!) 36.4 C  SpO2: 98%    Last Pain:  Vitals:   04/29/17 1152  TempSrc: Oral  PainSc: 4       Patients Stated Pain Goal: 5 ([id number])  Complications: No apparent anesthesia complications

## 2017-04-29 NOTE — Progress Notes (Signed)
No change to H&P.  Dawn Donn, DO 

## 2017-04-30 ENCOUNTER — Encounter (HOSPITAL_COMMUNITY): Payer: Self-pay | Admitting: Obstetrics & Gynecology

## 2017-04-30 ENCOUNTER — Telehealth: Payer: Self-pay | Admitting: *Deleted

## 2017-04-30 NOTE — Telephone Encounter (Signed)
Confirmed [facility] for 05/06/17 at 830am .  Instructions and contact information given.

## 2017-05-01 ENCOUNTER — Other Ambulatory Visit: Payer: Self-pay | Admitting: *Deleted

## 2017-05-01 DIAGNOSIS — Z170 Estrogen receptor positive status [ER+]: Secondary | ICD-10-CM

## 2017-05-01 DIAGNOSIS — C50411 Malignant neoplasm of upper-outer quadrant of right female breast: Secondary | ICD-10-CM

## 2017-05-01 DIAGNOSIS — C50211 Malignant neoplasm of upper-inner quadrant of right female breast: Secondary | ICD-10-CM | POA: Insufficient documentation

## 2017-05-06 ENCOUNTER — Ambulatory Visit: Payer: 59 | Attending: General Surgery | Admitting: Physical Therapy

## 2017-05-06 ENCOUNTER — Ambulatory Visit
Admission: RE | Admit: 2017-05-06 | Discharge: 2017-05-06 | Disposition: A | Discharge status: Home / Self Care | Payer: 59 | Source: Ambulatory Visit | Attending: Radiation Oncology | Admitting: Radiation Oncology

## 2017-05-06 ENCOUNTER — Encounter: Payer: Self-pay | Admitting: Hematology

## 2017-05-06 ENCOUNTER — Ambulatory Visit: Payer: Self-pay | Admitting: General Surgery

## 2017-05-06 ENCOUNTER — Ambulatory Visit (HOSPITAL_BASED_OUTPATIENT_CLINIC_OR_DEPARTMENT_OTHER): Payer: 59 | Admitting: Hematology

## 2017-05-06 ENCOUNTER — Encounter: Payer: Self-pay | Admitting: Physical Therapy

## 2017-05-06 VITALS — BP 127/74 | HR 82 | Temp 98.0°F | Resp 18 | Ht 63.0 in | Wt 182.7 lb

## 2017-05-06 DIAGNOSIS — C50211 Malignant neoplasm of upper-inner quadrant of right female breast: Secondary | ICD-10-CM | POA: Insufficient documentation

## 2017-05-06 DIAGNOSIS — C50911 Malignant neoplasm of unspecified site of right female breast: Secondary | ICD-10-CM

## 2017-05-06 DIAGNOSIS — R293 Abnormal posture: Secondary | ICD-10-CM | POA: Insufficient documentation

## 2017-05-06 DIAGNOSIS — Z170 Estrogen receptor positive status [ER+]: Secondary | ICD-10-CM

## 2017-05-06 DIAGNOSIS — Z800 Family history of malignant neoplasm of digestive organs: Secondary | ICD-10-CM

## 2017-05-06 DIAGNOSIS — C541 Malignant neoplasm of endometrium: Secondary | ICD-10-CM

## 2017-05-06 DIAGNOSIS — Z8052 Family history of malignant neoplasm of bladder: Secondary | ICD-10-CM | POA: Diagnosis not present

## 2017-05-06 NOTE — Progress Notes (Signed)
[name]  [phone] Fax:([phone]) [phone]  Clinic New Consult Note   Patient Care Team: [name], MD as PCP - Haskell Riling, MD as Consulting Physician (General Surgery) Truitt Merle, MD as Consulting Physician (Hematology) [name], MD as Attending Physician (Radiation Oncology) 05/06/2017  CHIEF COMPLAINTS/PURPOSE OF CONSULTATION:  Newly diagnosed right breast cancer    Malignant neoplasm of upper-inner quadrant of right breast in female, estrogen receptor positive ([place])   04/27/2017 Initial Biopsy    Diagnosis Breast, right, needle core biopsy INVASIVE DUCTAL CARCINOMA, GRADE 2 Microscopic Comment The neoplasm has intracellular mucin and signet ring cell features. Immunostains shows these cells are positive for ER,GATA3, ck7 and GCDFP, negative for ck20, cdx2, TTF-1 and pax8, The immunostaining pattern supports the neoplasm is breast primary.      04/27/2017 Receptors her2    Estrogen Receptor: 70%, POSITIVE, MODERATE STAINING INTENSITY Progesterone Receptor: 0%, NEGATIVE Proliferation Marker Ki67: 30% HER2 - **POSITIVE** RATIO OF HER2/CEP17 SIGNALS 2.91 AVERAGE HER2 COPY NUMBER PER CELL 7.28      04/27/2017 Initial Diagnosis    Malignant neoplasm of upper-inner quadrant of right breast in female, estrogen receptor positive ([address])       Mammogram          HISTORY OF PRESENTING ILLNESS (05/06/2017):  Dawn [name] 66 y.o. female is here because of newly diagnosed breast cancer. Screening mammogram detected right breast mass on 04/20/2017. Ultrasound showed 1.5 cm mass in the 1:00 position. The axilla was negative on ultrasound. Biopsy of the right breast was performed on 04/27/2017 revealing invasive ductal carcinoma, Grade 2, ER 70% / PR 0% / HER-2+ / Ki-67 30%.   Of note, she was also recently diagnosed with endometrial cancer. Biopsy of curettage endometrium on 04/29/2017 revealed adenocarcinoma. Immunohistory  shows the tumor is positive with estrogen receptor, progesterone receptor with patchy positivity with carcinoembryonic antigen and p16. The tumor is negative with CD10 and vimentin. The morphology and immunophenotype are not definitive as to endocervical or endometrial origin. There are rare fragments with endometrial type stroma, which may indicate an endometrial origin although this is not definitive as to origin.  The patient presents today in our multidisciplinary breast clinic. She is doing well overall. She went to her PCP with abdominal pain and vaginal bleeding which felt like she was having her period all the time. Then she presented to the ER with severe vertigo and heavy vaginal bleeding. She has been having heavy vaginal bleeding since mid-July. She does not have a history of anemia and associates it with recent bleeding. She has been taking oral iron. She took an aspirin daily but, has not been taking it lately. She had been taking Evista for one year and stopped taking it on her own. She was taking this medication for her bone density. She takes 2-3 Tylenol daily to manage the abdominal pain / cramping discomfort. She believes this pain is associated with passing clots. She takes a multivitamin daily. The patient is apprehensive about chemotherapy and is unsure if she would like to proceed with chemotherapy if it is recommended. She reports leg cramping. She does not take potassium supplements but, does try to eat bananas. States liver function is typically elevated and has been for about the last 2-3 years.   She has never smoked and doesn't drink alcohol. She did grow up with heavy smoking parents.   Father had colon cancer diagnosed at 15 years-old, and he died of bladder cancer. Paternal grandmother  with some type of gynecologic cancer, age of onset in her mid-48s. Patient believes there was more cancer in her family in aunts and uncles however, there are no more living family members to  discuss this with.   GYN HISTORY  Menarchal: 75 LMP: age of 35 Contraceptive: none  HRT: none  G2P2: She was 81 when she gave first live births.  MEDICAL HISTORY:  Past Medical History:  Diagnosis Date  . Anemia    as a child.  . Arthritis   . Bilateral cataracts   . Bilateral leg cramps   . Cancer ([place])    skin  . Dyspnea   . Fuchs' corneal dystrophy   . GERD (gastroesophageal reflux disease)   . Hearing loss    Right side 30%  . Heart murmur    childhood  . History of hiatal hernia    small size  . Hyperlipidemia   . Hypertension   . IBS (irritable bowel syndrome)   . NAFL (nonalcoholic fatty liver)   . Obesity   . Pre-diabetes   . 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 Vertigo     SURGICAL HISTORY: Past Surgical History:  Procedure Laterality Date  . BREAST BIOPSY Right 04/27/2017  . COLONOSCOPY    . HYSTEROSCOPY W/D&C N/A 04/29/2017   Procedure: DILATATION AND CURETTAGE /HYSTEROSCOPY;  Surgeon: Linda Hedges, DO;  Location: [facility];  Service: Gynecology;  Laterality: N/A;    SOCIAL HISTORY: Social History   Social History  . Marital status: Married    Spouse name: N/A  . Number of children: N/A  . Years of education: N/A   Occupational History  .  [employer]    Social History Main Topics  . Smoking status: Never Smoker  . Smokeless tobacco: Never Used  . Alcohol use 0.0 oz/week     Comment: <1/week wine or beer  . Drug use: No  . Sexual activity: Not on file   Other Topics Concern  . Not on file   Social History Narrative   2-3 caffeine drinks per day. Regular exercise.  Walking 3 miles a day.     FAMILY HISTORY: Family History  Problem Relation Age of Onset  . Cancer Father        died of bladder cancer, had colon cancer also   . Colon cancer Father 49  . Heart attack Father 60  . Stroke Mother   . Diabetes Maternal Grandfather   . Kidney disease Maternal  Grandfather   . Asthma Brother   . Cancer Paternal Grandmother 78       GYN cancer     ALLERGIES:  is allergic to crestor [rosuvastatin calcium] and penicillins.  MEDICATIONS:  Current Outpatient Prescriptions  Medication Sig Dispense Refill  . Ascorbic Acid (VITAMIN C) 1000 MG tablet Take 1,000 mg by mouth daily.    Marland Kitchen BENICAR HCT 20-12.5 MG tablet TAKE 1 TABLET BY MOUTH DAILY. (Patient taking differently: Take 1 tablet by mouth daily. ) 90 tablet 3  . ferrous sulfate 325 (65 FE) MG tablet Take 1 tablet (325 mg total) by mouth daily. (Patient taking differently: Take 325 mg by mouth daily at 12 noon. ) 30 tablet 0  . Multiple Vitamin (MULTIVITAMIN) capsule Take 1 capsule by mouth daily.      . Pitavastatin Calcium (LIVALO) 2 MG TABS TAKE 1 & 1/2 TABLETS (3 MG TOTAL) BY MOUTH AT  BEDTIME. (Patient taking differently: Take 3 mg by mouth at bedtime. TAKE 1 & 1/2 TABLETS (3 MG TOTAL) BY MOUTH AT BEDTIME.) 135 tablet 3  . sodium chloride (MURO 128) 5 % ophthalmic ointment Place 1 application into both eyes at bedtime.    . sodium chloride (MURO 128) 5 % ophthalmic solution Place 1 drop into both eyes 2 (two) times daily.    . vitamin C (ASCORBIC ACID) 500 MG tablet Take 1,000 mg by mouth daily.     Marland Kitchen acetaminophen (TYLENOL) 325 MG tablet Take 650 mg by mouth every 6 (six) hours as needed for mild pain or moderate pain.    . fluticasone (FLONASE) 50 MCG/ACT nasal spray Place 1 spray into both nostrils daily. I-2 SPRAYS AS NEEDED. (Patient not taking: Reported on 04/22/2017) 16 g 3  . ibuprofen (ADVIL,MOTRIN) 600 MG tablet Take 1 tablet (600 mg total) by mouth every 6 (six) hours as needed. (Patient not taking: Reported on 05/06/2017) 30 tablet 0  . meclizine (ANTIVERT) 25 MG tablet Take 1-2 tablets (25-50 mg total) by mouth 3 (three) times daily as needed for dizziness. (Patient not taking: Reported on 05/06/2017) 30 tablet 0  . ondansetron (ZOFRAN) 4 MG tablet Take 1 tablet (4 mg total) by mouth every 8  (eight) hours as needed for nausea or vomiting. 12 tablet 0  . oxyCODONE-acetaminophen (ROXICET) 5-325 MG tablet Take 1-2 tablets by mouth every 4 (four) hours as needed for severe pain. (Patient not taking: Reported on 05/06/2017) 10 tablet 0  . raloxifene (EVISTA) 60 MG tablet Take 1 tablet by mouth daily.  3   No current facility-administered medications for this visit.     REVIEW OF SYSTEMS:   Constitutional: Denies fevers, chills or abnormal night sweats Eyes: Denies blurriness of vision, double vision or watery eyes Ears, nose, mouth, throat, and face: Denies mucositis or sore throat Respiratory: Denies cough, dyspnea or wheezes Cardiovascular: Denies palpitation, chest discomfort or lower extremity swelling Gastrointestinal:  Denies nausea, heartburn or change in bowel habits (+) abdominal pain / cramping associated with vaginal bleeding and endometrial cancer Gynecological: (+) heavy vaginal bleeding since mid-July Skin: Denies abnormal skin rashes Lymphatics: Denies new lymphadenopathy or easy bruising Musculoskeletal: (+) leg cramping Neurological:Denies numbness, tingling or new weaknesses Behavioral/Psych: Mood is stable, no new changes  All other systems were reviewed with the patient and are negative.  PHYSICAL EXAMINATION: ECOG PERFORMANCE STATUS: 0 - Asymptomatic  Vitals:   05/06/17 0918  BP: 127/74  Pulse: 82  Resp: 18  Temp: 98 F (36.7 C)  SpO2: 99%   Filed Weights   05/06/17 0918  Weight: 182 lb 11.2 oz (82.9 kg)    GENERAL:alert, no distress and comfortable SKIN: skin color, texture, turgor are normal, no rashes or significant lesions EYES: normal, conjunctiva are pink and non-injected, sclera clear OROPHARYNX:no exudate, no erythema and lips, buccal mucosa, and tongue normal  NECK: supple, thyroid normal size, non-tender, without nodularity LYMPH:  no palpable lymphadenopathy in the cervical, axillary or inguinal LUNGS: clear to auscultation and  percussion with normal breathing effort HEART: regular rate & rhythm and no lower extremity edema (+) heart murmur in the aortic valve ABDOMEN:abdomen soft, non-tender and normal bowel sounds Musculoskeletal:no cyanosis of digits and no clubbing  PSYCH: alert & oriented x 3 with fluent speech NEURO: no focal motor/sensory deficits BREAST: Right breast shows no palpable mass or adenopathy. Tenderness on palpation near the biopsy site. Left breast shows no palpable mass or adenopathy.  LABORATORY  DATA:  I have reviewed the data as listed CBC Latest Ref Rng & Units 04/28/2017 04/03/2017 04/02/2017  WBC 4.0 - 10.5 K/uL 7.1 5.9 7.1  Hemoglobin 12.0 - 15.0 g/dL 13.5 12.1 12.9  Hematocrit 36.0 - 46.0 % 38.0 34.6(L) 36.1  Platelets 150 - 400 K/uL 216 195 171    CMP Latest Ref Rng & Units 04/28/2017 04/02/2017 12/25/2016  Glucose 65 - 99 mg/dL 89 188(H) 101(H)  BUN 6 - 20 mg/dL '12 13 13  '$ Creatinine 0.44 - 1.00 mg/dL 0.74 1.00 0.95  Sodium 135 - 145 mmol/L 136 129(L) 141  Potassium 3.5 - 5.1 mmol/L 3.4(L) 3.1(L) 3.9  Chloride 101 - 111 mmol/L 103 94(L) 104  CO2 22 - 32 mmol/L '24 24 28  '$ Calcium 8.9 - 10.3 mg/dL 9.3 8.3(L) 9.2  Total Protein 6.5 - 8.1 g/dL - 7.1 -  Total Bilirubin 0.3 - 1.2 mg/dL - 0.7 -  Alkaline Phos 38 - 126 U/L - 42 -  AST 15 - 41 U/L - 113(H) -  ALT 14 - 54 U/L - 169(H) -   PATHOLOGY FINDINGS:  Diagnosis 04/29/2017 Endometrium, curettage - ADENOCARCINOMA. - SEE MICROSCOPIC DESCRIPTION. Microscopic Comment There are fragments of adenocarcinoma, some of which have cribriform architecture, and there is focal extracellular mucin. Immunohistochemistry shows the tumor is positive with estrogen receptor, progesterone receptor with patchy positivity with carcinoembryonic antigen and p16. The tumor is negative with CD10 and vimentin. The morphology and immunophenotype are not definitive as to endocervical or endometrial origin. There are rare fragments with endometrial type  stroma, which may indicate an endometrial origin although this is not definitive as to origin.  Diagnosis 04/27/2017 Breast, right, needle core biopsy INVASIVE DUCTAL CARCINOMA, GRADE 2 Microscopic Comment The neoplasm has intracellular mucin and signet ring cell features. Immunostains shows these cells are positive for ER, GATA3, ck7 and GCDFP, negative for ck20, cdx2, TTF-1 and pax8, The immunostaining pattern supports the neoplasm is breast primary. The Breast prognostic profile has been ordered. Results: IMMUNOHISTOCHEMICAL AND MORPHOMETRIC ANALYSIS PERFORMED MANUALLY Estrogen Receptor: 70%, POSITIVE, MODERATE STAINING INTENSITY Progesterone Receptor: 0%, NEGATIVE Proliferation Marker Ki67: 30% Results: HER2 - **POSITIVE** RATIO OF HER2/CEP17 SIGNALS 2.91 AVERAGE HER2 COPY NUMBER PER CELL 7.28  RADIOGRAPHIC STUDIES: I have personally reviewed the radiological images as listed and agreed with the findings in the report. No results found.  ASSESSMENT & PLAN:  66 y.o. 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 Malignant neoplasm of upper inner quadrant of right breast , Invasive ductal carcinoma, cT1cN0M0, G2,  ER+/PR-/HER-2+ --We discussed her imaging findings and the biopsy results in great details. -Giving the small size of her primary breast tumor, she is likely a candidate for lumpectomy and sentinel lymph node biopsy. She is agreeable with that. She was seen by Dr. Excell Seltzer today and likely will proceed with surgery soon.  -Due to her family history of malignancy, her personal history of both breast and endometrial cancer, she will be referred to genetic counselor for genetic testing to ruled out inheritable cancer syndrome, and the results may change her surgical recommendation.  -We discussed the risk of cancer recurrence after complete surgical resection, which is determined by stage and cancer biology. -We discussed at  that HER-2 positive disease is more aggressive, giving the size of her primary tumor, I recommend her to consider adjuvant chemotherapy to reduce her risk of recurrence. -We discussed the adjuvant chemotherapy regimen, which will be the  combination of chemotherapy and anti-HER-2 therapy. If her tumor is less than 1.5 cm and node negative, I will recommend weekly Taxol and Herceptin for 12 weeks, followed by maintenance Herceptin for total of 6 months treatment. If she has more advanced disease, I'll recommend docetaxel, carboplatin, Herceptin, with or without pejeta as her adjuvant regimen.  -We reviewed the potential and if it of adjuvant chemotherapy, and associated side effects from this regimens. Patient is very concerned about side effects from chemotherapy, and is reluctant to take chemotherapy. She will think about it.  -She is agreeable to have a port placed during her breast surgery. -Giving the strong ER and PR expression in her postmenopausal status, I recommend adjuvant endocrine therapy with aromatase inhibitor for a total of 5-10 years to reduce the risk of cancer recurrence. Potential benefits and side effects were discussed with patient and she is interested. -She was also seen by radiation oncologist [name] today. -We also discussed the breast cancer surveillance after her surgery. She will continue annual screening mammogram, self exam, and a routine office visit with lab and exam with [name]. -I encouraged her to have healthy diet and exercise regularly.  -Encouraged her to discontinue Evista and daily aspirin at this time, before surgery.  - If she decides to proceed with chemotherapy, we will refer her to cardiology for echocardiogram and monitoring during her anti-HER-2 therapy.  - I will add a liver function test to her labs today  -She had a CT abdomen and pelvis recently due to her vaginal bleeding and abdominal pain, which was negative for metastatic disease - I will order a CT  Chest to be performed in the next few weeks for staging also.  2. Endometrial adenocarcinoma -She is postmenopausal, has developed vaginal bleeding, and no abdominal pain. She was seen by her gynecologist and endometrial cancer was diagnosed recently based on biopsy. -She has been referred to GYN oncologist Dr. Denman George, we spoke with her office to get an appointment as soon as possible. -Dr. Excell Seltzer will coordinate her breast surgery with Dr. Denman George who will likely recommend hysterectomy.  3. Genetics -Given her personal history of breast and endometrial cancer, and a family history of malignancy, she will be referred to see a genetic counselor for genetic testing to ruled out cancer syndromes.  Follow-up: - Genetics referral - Referral to Dr. Denman George - I will add a liver function test to her labs. - Order CT Chest to be done in the next few weeks - She will proceed with breast conservation therapy and adjuvant radiation therapy. - I will see her again 1 week after surgery to discuss her surgical path results and finalize her adjuvant systemic therapy.  Orders Placed This Encounter  Procedures  . Hepatic function panel    Standing Status:   Future    Standing Expiration Date:   05/06/2018  . ECHOCARDIOGRAM COMPLETE    Standing Status:   Future    Standing Expiration Date:   08/06/2018    Order Specific Question:   Where should this test be performed    Answer:   [place]    Order Specific Question:   Perflutren DEFINITY (image enhancing agent) should be administered unless hypersensitivity or allergy exist    Answer:   Administer Perflutren    Order Specific Question:   Expected Date:    Answer:   1 week    All questions were answered. The patient knows to call the clinic with any problems, questions or  concerns. I spent 60 minutes counseling the patient face to face. The total time spent in the appointment was 70 minutes and more than 50% was on counseling.     Truitt Merle,  MD 05/06/2017 5:53 PM

## 2017-05-06 NOTE — Patient Instructions (Signed)

## 2017-05-06 NOTE — Therapy (Signed)
[facility] [address] [place], [place], [address] Phone: [phone]   Fax:  [phone]  Physical Therapy Evaluation  Patient Details  Name: Dawn [name] MRN: [id number] Date of Birth: April 19, 1951 Referring Provider: Dr. Glenna Fellows  Encounter Date: 05/06/2017      PT End of Session - 05/06/17 1402    Visit Number 1   Number of Visits 1   PT Start Time 1212   PT Stop Time 1240   PT Time Calculation (min) 28 min   Activity Tolerance Patient tolerated treatment well   Behavior During Therapy [facility] for tasks assessed/performed      Past Medical History:  Diagnosis Date  . Anemia    as a child.  . Arthritis   . Bilateral cataracts   . Bilateral leg cramps   . Cancer (HCC)    skin  . Dyspnea   . Fuchs' corneal dystrophy   . GERD (gastroesophageal reflux disease)   . Hearing loss    Right side 30%  . Heart murmur    childhood  . History of hiatal hernia    small size  . Hyperlipidemia   . Hypertension   . IBS (irritable bowel syndrome)   . NAFL (nonalcoholic fatty liver)   . Obesity   . Pre-diabetes   . 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 Vertigo     Past Surgical History:  Procedure Laterality Date  . BREAST BIOPSY Right 04/27/2017  . COLONOSCOPY    . HYSTEROSCOPY W/D&C N/A 04/29/2017   Procedure: DILATATION AND CURETTAGE /HYSTEROSCOPY;  Surgeon: Mitchel Honour, DO;  Location: [facility];  Service: Gynecology;  Laterality: N/A;    There were no vitals filed for this visit.       Subjective Assessment - 05/06/17 1322    Subjective Patient reports she is here today to be seen by her medical team for her newly diagnosed right breast cancer.   Patient is accompained by: Family member   Pertinent History Patient was diagnosed on 04/20/17 with right grade 2 invasive ductal carcinoma breast cancer. It measures 1.5 cm and is located in the upper inner quadrant. It is ER  positive, PR negative and HER2 positive with a Ki67 of 30%. She has also been diagnosed with endometrial cancer and is scheduled to have a complete hysterectomy.   Patient Stated Goals Reduce lymphedema risk and learn post op shoulder ROM HEP   Currently in Pain? No/denies            [facility] PT Assessment - 05/06/17 0001      Assessment   Medical Diagnosis Right breast cancer   Referring Provider Dr. Glenna Fellows   Onset Date/Surgical Date 04/20/17   Hand Dominance Right   Prior Therapy none     Precautions   Precautions Other (comment)   Precaution Comments Active cancer     Restrictions   Weight Bearing Restrictions No     Balance Screen   Has the patient fallen in the past 6 months No   Has the patient had a decrease in activity level because of a fear of falling?  No   Is the patient reluctant to leave their home because of a fear of falling?  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   Leisure She walks 3 miles 3-4x/week     Cognition   Overall Cognitive Status Within Functional Limits for tasks assessed     Posture/Postural Control   Posture/Postural Control Postural limitations   Postural Limitations Rounded Shoulders;Forward head     ROM / Strength   AROM / PROM / Strength AROM;Strength     AROM   AROM Assessment Site Shoulder;Cervical   Right/Left Shoulder Right;Left   Right Shoulder Extension 40 Degrees   Right Shoulder Flexion 128 Degrees   Right Shoulder ABduction 140 Degrees   Right Shoulder Internal Rotation 67 Degrees   Right Shoulder External Rotation 80 Degrees   Left Shoulder Extension 44 Degrees   Left Shoulder Flexion 134 Degrees   Left Shoulder ABduction 153 Degrees   Left Shoulder Internal  Rotation 85 Degrees   Left Shoulder External Rotation 85 Degrees   Cervical Flexion WNL   Cervical Extension WNL   Cervical - Right Side Bend 25% limited   Cervical - Left Side Bend WNL   Cervical - Right Rotation 25% limited   Cervical - Left Rotation WNL     Strength   Overall Strength Within functional limits for tasks performed           LYMPHEDEMA/ONCOLOGY QUESTIONNAIRE - 05/06/17 1400      Type   Cancer Type Right breast cancer     Lymphedema Assessments   Lymphedema Assessments Upper extremities     Right Upper Extremity Lymphedema   10 cm Proximal to Olecranon Process 28.2 cm   Olecranon Process 25.3 cm   10 cm Proximal to Ulnar Styloid Process 21.7 cm   Just Proximal to Ulnar Styloid Process 15.5 cm   Across Hand at [employer] 18.5 cm   At [place] of 2nd Digit 6.1 cm     Left Upper Extremity Lymphedema   10 cm Proximal to Olecranon Process 29.6 cm   Olecranon Process 25.8 cm   10 cm Proximal to Ulnar Styloid Process 22.8 cm   Just Proximal to Ulnar Styloid Process 16 cm   Across Hand at [employer] 18.4 cm   At [place] of 2nd Digit 6 cm         Objective measurements completed on examination: See above findings.       Patient was instructed today in a home exercise program today for post op shoulder range of motion. These included active assist shoulder flexion in sitting, scapular retraction, wall walking with shoulder abduction, and hands behind head external rotation.  She was encouraged to do these twice a day, holding 3 seconds and repeating 5 times when permitted by her physician.               PT Education - 05/06/17 1402    Education provided Yes   Education Details Lymphedema risk reduction and post op shoulder ROM HEP   Person(s) Educated Patient;Spouse   Methods Explanation;Demonstration;Handout   Comprehension Returned demonstration;Verbalized understanding              Breast Clinic Goals - 05/06/17 1459       Patient will be able to verbalize understanding of pertinent lymphedema risk reduction practices relevant to her diagnosis specifically related to skin care.   Time 1   Period Days   Status Achieved     Patient will be able to return demonstrate and/or verbalize understanding of the post-op  home exercise program related to regaining shoulder range of motion.   Time 1   Period Days   Status Achieved     Patient will be able to verbalize understanding of the importance of attending the postoperative After Breast Cancer Class for further lymphedema risk reduction education and therapeutic exercise.   Time 1   Period Days   Status Achieved               Plan - 05/06/17 1403    Clinical Impression Statement Patient was diagnosed on 04/20/17 with right grade 2 invasive ductal carcinoma breast cancer. It measures 1.5 cm and is located in the upper inner quadrant. It is ER positive, PR negative and HER2 positive with a Ki67 of 30%. She has also been diagnosed with endometrial cancer and is scheduled to have a complete hysterectomy. Her multidisciplinary medical team met prior to her assessments to determine a recommended treatment plan. She is planning to have a right lumpectomy and sentinel node biopsy and also a complete hysterectomy, chemotherapy, radiation, and anti-estrogen therapy. She will benefit from post op PT to regain shoulder ROM and reduce lympehdema risk.    History and Personal Factors relevant to plan of care: Also has endometrial cancer.   Clinical Presentation Evolving   Clinical Presentation due to: Condition is evolving as she is scheduled to have breast and uterine surgery, possibly concurrently and patient has cancer in 2 areas.   Clinical Decision Making Moderate   Rehab Potential Excellent   Clinical Impairments Affecting Rehab Potential Above stated comorbidities   PT Frequency One time visit   PT Treatment/Interventions Patient/family education;Therapeutic exercise    PT Next Visit Plan Will f/u after surgery to determine PT needs   PT Home Exercise Plan Post op shoulder ROM HEP   Consulted and Agree with Plan of Care Patient;Family member/caregiver   Family Member Consulted Husband      Patient will benefit from skilled therapeutic intervention in order to improve the following deficits and impairments:  Postural dysfunction, Decreased knowledge of precautions, Pain, Impaired UE functional use, Decreased range of motion  Visit Diagnosis: Abnormal posture - Plan: PT plan of care cert/re-cert  Carcinoma of upper-inner quadrant of right breast in female, estrogen receptor positive ([place]) - Plan: PT plan of care cert/re-cert   Patient will follow up at outpatient cancer rehab if needed following surgery.  If the patient requires physical therapy at that time, a specific plan will be dictated and sent to the referring physician for approval. The patient was educated today on appropriate basic range of motion exercises to begin post operatively and the importance of attending the After Breast Cancer class following surgery.  Patient was educated today on lymphedema risk reduction practices as it pertains to recommendations that will benefit the patient immediately following surgery.  She verbalized good understanding.  No additional physical therapy is indicated at this time.      Problem List Patient Active Problem List   Diagnosis Date Noted  . Malignant neoplasm of upper-inner quadrant of right breast in female, estrogen receptor positive ([place]) 05/01/2017  . Osteoporosis 05/24/2015  . IFG (impaired fasting glucose) 12/02/2013  . Meniere disease 01/31/2011  . SEBORRHEIC KERATOSIS 07/10/2010  . MUSCLE SPASM, TRAPEZIUS 07/10/2010  . 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 POSTMENOPAUSAL STATUS 03/08/2010  . INTERNAL HEMORRHOIDS 07/26/2009  . EROSIVE ESOPHAGITIS 07/26/2009  . DIVERTICULOSIS, COLON 07/26/2009  . FATTY LIVER DISEASE 07/26/2009  .  COLONIC POLYPS, HYPERPLASTIC, HX OF 07/26/2009  .  TRANSAMINASES, SERUM, ELEVATED 01/31/2009  . HYPERTENSION, BENIGN 01/16/2009  . Hyperlipidemia 04/22/2007  . LOSS, HEARING NOS 04/22/2007   Annia Friendly, PT 05/06/17 3:05 PM  [facility] [address] [place], [place], [address] Phone: [phone]   Fax:  [phone]  Name: TREMAINE FUHRIMAN MRN: [id number] Date of Birth: 06-11-1951

## 2017-05-06 NOTE — Progress Notes (Signed)
Nutrition Assessment  Reason for Assessment:  Pt seen in Breast Clinic  ASSESSMENT:   66 year old female with new diagnosis of right breast mass.  Past medical history HTN, HLD, endometrial cancer.    Patient reports normal appetite.    Medications:  Vit C, Fe sulfate. Multivitamin, zofran  Labs: reviewed  Anthropometrics:   Height: 63 inches Weight: 182 lb 11.2 oz BMI: 32.3   NUTRITION DIAGNOSIS: Food and nutrition related knowledge deficit related to new diagnosis of breast cancer as evidenced by no prior need for nutrition related information.  INTERVENTION:   Discussed and provided packet of information regarding nutritional tips for breast cancer patients.  Questions answered.  Teachback method used.  Contact information provided and patient knows to contact me with questions/concerns.    MONITORING, EVALUATION, and GOAL: Pt will consume a healthy plant based diet to maintain lean body mass throughout treatment.   Milayna Rotenberg B. Zenia Resides, [place], [place] Registered Dietitian [phone] (pager)

## 2017-05-06 NOTE — Progress Notes (Signed)
Radiation Oncology         [phone] ________________________________  Initial Outpatient Consultation  Name: Dawn [name] MRN: [id number]  Date: 05/06/2017  DOB: [birth year]  [id number], Rene Kocher, MD  Excell Seltzer, MD   REFERRING PHYSICIAN: Excell Seltzer, MD  DIAGNOSIS:    ICD-10-CM   1. Malignant neoplasm of upper-inner quadrant of right breast in female, estrogen receptor positive ([place]) C50.211    Z17.0    Cancer Staging Malignant neoplasm of upper-inner quadrant of right breast in female, estrogen receptor positive ([place]) Staging form: Breast, AJCC 8th Edition - Clinical stage from 05/06/2017: Stage IA (cT1c, cN0, cM0, G2, ER: Positive, PR: Negative, HER2: Positive) - Unsigned  Stage IA Right Breast UIQ Invasive Ductal Carcinoma, ER(+) / PR(-) / Her2(+), Grade 2  CHIEF COMPLAINT: Here to discuss management of right breast cancer  HISTORY OF PRESENT ILLNESS::Dawn [name] is a 66 y.o. female who presented with a right breast mass on screening mammography, spiculated in the posterior medial breast. It measured 1.5 cm on ultrasound the 1:00 position. The axilla was negative on ultrasound. Biopsy showed Grade 2 invasive ductal carcinoma with ER(+)/PR(-)/HER2(+), Ki-67 30%. She was recently diagnosed with endometrial cancer, as well. Adenocarcinoma on pathology report.  Per imaging, it appears this may be early stage.  She is awaiting a visit with Dr Denman George.  The patient presents today to our multidisciplinary tumor board to discuss management of her right breast cancer. She is accompanied by her husband. On review of systems, the patient is positive for chills, night sweats, fatigue, cramping pain in the abdomen, and post- menopausal vaginal bleeding. She is also positive for hearing loss, tinnitus, sinus problems, sore throat, dry coughing, heartburn, change in stool color (reports iron-intake), hematuria, anxiety, and hot flashes. She wears glasses and is positive for  change in vision and blurred vision.   PREVIOUS RADIATION THERAPY: No  PAST MEDICAL HISTORY:  has a past medical history of Anemia; Arthritis; Bilateral cataracts; Bilateral leg cramps; Cancer ([place]); Dyspnea; Fuchs' corneal dystrophy; GERD (gastroesophageal reflux disease); Hearing loss; Heart murmur; History of hiatal hernia; Hyperlipidemia; Hypertension; IBS (irritable bowel syndrome); NAFL (nonalcoholic fatty liver); Obesity; Pre-diabetes; Tuberculosis; and Vertigo.    PAST SURGICAL HISTORY: Past Surgical History:  Procedure Laterality Date  . BREAST BIOPSY Right 04/27/2017  . COLONOSCOPY    . HYSTEROSCOPY W/D&C N/A 04/29/2017   Procedure: DILATATION AND CURETTAGE /HYSTEROSCOPY;  Surgeon: Linda Hedges, DO;  Location: [facility];  Service: Gynecology;  Laterality: N/A;    FAMILY HISTORY: family history includes Asthma in her brother; Cancer in her father; Cancer (age of onset: 34) in her paternal grandmother; Colon cancer (age of onset: 56) in her father; Diabetes in her maternal grandfather; Heart attack (age of onset: 34) in her father; Kidney disease in her maternal grandfather; Stroke in her mother.  SOCIAL HISTORY:  reports that she has never smoked. She has never used smokeless tobacco. She reports that she drinks alcohol. She reports that she does not use drugs.  ALLERGIES: Crestor [rosuvastatin calcium] and Penicillins  MEDICATIONS:  Current Outpatient Prescriptions  Medication Sig Dispense Refill  . acetaminophen (TYLENOL) 325 MG tablet Take 650 mg by mouth every 6 (six) hours as needed for mild pain or moderate pain.    . Ascorbic Acid (VITAMIN C) 1000 MG tablet Take 1,000 mg by mouth daily.    Marland Kitchen BENICAR HCT 20-12.5 MG tablet TAKE 1 TABLET BY MOUTH DAILY. (Patient taking differently: Take 1 tablet by mouth daily. ) 90  tablet 3  . ferrous sulfate 325 (65 FE) MG tablet Take 1 tablet (325 mg total) by mouth daily. (Patient taking differently: Take 325 mg by mouth daily at 12 noon. )  30 tablet 0  . fluticasone (FLONASE) 50 MCG/ACT nasal spray Place 1 spray into both nostrils daily. I-2 SPRAYS AS NEEDED. (Patient not taking: Reported on 04/22/2017) 16 g 3  . ibuprofen (ADVIL,MOTRIN) 600 MG tablet Take 1 tablet (600 mg total) by mouth every 6 (six) hours as needed. (Patient not taking: Reported on 05/06/2017) 30 tablet 0  . meclizine (ANTIVERT) 25 MG tablet Take 1-2 tablets (25-50 mg total) by mouth 3 (three) times daily as needed for dizziness. (Patient not taking: Reported on 05/06/2017) 30 tablet 0  . Multiple Vitamin (MULTIVITAMIN) capsule Take 1 capsule by mouth daily.      . ondansetron (ZOFRAN) 4 MG tablet Take 1 tablet (4 mg total) by mouth every 8 (eight) hours as needed for nausea or vomiting. 12 tablet 0  . oxyCODONE-acetaminophen (ROXICET) 5-325 MG tablet Take 1-2 tablets by mouth every 4 (four) hours as needed for severe pain. (Patient not taking: Reported on 05/06/2017) 10 tablet 0  . Pitavastatin Calcium (LIVALO) 2 MG TABS TAKE 1 & 1/2 TABLETS (3 MG TOTAL) BY MOUTH AT BEDTIME. (Patient taking differently: Take 3 mg by mouth at bedtime. TAKE 1 & 1/2 TABLETS (3 MG TOTAL) BY MOUTH AT BEDTIME.) 135 tablet 3  . raloxifene (EVISTA) 60 MG tablet Take 1 tablet by mouth daily.  3  . sodium chloride (MURO 128) 5 % ophthalmic ointment Place 1 application into both eyes at bedtime.    . sodium chloride (MURO 128) 5 % ophthalmic solution Place 1 drop into both eyes 2 (two) times daily.    . vitamin C (ASCORBIC ACID) 500 MG tablet Take 1,000 mg by mouth daily.      No current facility-administered medications for this encounter.     REVIEW OF SYSTEMS: A 10+ POINT REVIEW OF SYSTEMS WAS OBTAINED including neurology, dermatology, psychiatry, cardiac, respiratory, lymph, extremities, GI, GU, Musculoskeletal, constitutional, breasts, reproductive, HEENT.  All pertinent positives are noted in the HPI.  All others are negative.   PHYSICAL EXAM:  Vitals with BMI 05/06/2017  Height 5' 3"    Weight 182 lbs 11 oz  BMI 39.76  Systolic 734  Diastolic 74  Pulse 82  Respirations 18   General: Alert and oriented, in no acute distress HEENT: Head is normocephalic. Extraocular movements are intact. Oropharynx is clear. Neck: Neck is supple, no palpable cervical or supraclavicular lymphadenopathy. Heart: Regular in rate and rhythm with no rubs or gallops. Very mild murmur at the aortic region. Chest: Clear to auscultation bilaterally, with no rhonchi, wheezes, or rales. Abdomen: Soft, nontender, nondistended, with no rigidity or guarding. Extremities: No cyanosis or edema. Lymphatics: see Neck Exam Skin: No concerning lesions. Musculoskeletal: symmetric strength and muscle tone throughout. Neurologic: Cranial nerves II through XII are grossly intact. No obvious focalities. Speech is fluent. Coordination is intact. Psychiatric: Judgment and insight are intact. Affect is appropriate. Breasts: No palpable masses in the left breast or left axilla. No obvious mass appreciated in the right breast. No other palpable masses appreciated in the breasts or axillae.   ECOG = 0  0 - Asymptomatic (Fully active, able to carry on all predisease activities without restriction)  1 - Symptomatic but completely ambulatory (Restricted in physically strenuous activity but ambulatory and able to carry out work of a light or sedentary  nature. For example, light housework, office work)  2 - Symptomatic, <50% in bed during the day (Ambulatory and capable of all self care but unable to carry out any work activities. Up and about more than 50% of waking hours)  3 - Symptomatic, >50% in bed, but not bedbound (Capable of only limited self-care, confined to bed or chair 50% or more of waking hours)  4 - Bedbound (Completely disabled. Cannot carry on any self-care. Totally confined to bed or chair)  5 - Death   Eustace Pen MM, Creech RH, Tormey DC, et al. [phone]). "Toxicity and response criteria of the [employer]". [place] Oncol. 5 (6): 649-55   LABORATORY DATA:  Lab Results  Component Value Date   WBC 7.1 04/28/2017   HGB 13.5 04/28/2017   HCT 38.0 04/28/2017   MCV 94.1 04/28/2017   PLT 216 04/28/2017   CMP     Component Value Date/Time   NA 136 04/28/2017 1135   K 3.4 (L) 04/28/2017 1135   CL 103 04/28/2017 1135   CO2 24 04/28/2017 1135   GLUCOSE 89 04/28/2017 1135   BUN 12 04/28/2017 1135   CREATININE 0.74 04/28/2017 1135   CREATININE 0.95 12/25/2016 0948   CALCIUM 9.3 04/28/2017 1135   PROT 7.1 04/02/2017 2339   ALBUMIN 3.8 04/02/2017 2339   AST 113 (H) 04/02/2017 2339   ALT 169 (H) 04/02/2017 2339   ALKPHOS 42 04/02/2017 2339   BILITOT 0.7 04/02/2017 2339   GFRNONAA >60 04/28/2017 1135   GFRNONAA 81 06/04/2016 0839   GFRAA >60 04/28/2017 1135   GFRAA >89 06/04/2016 0839       RADIOGRAPHY: as above  IMPRESSION/PLAN: Right Breast Cancer  The patient has been discussed at our multidisciplinary tumor board.  The consensus is that she would be a good candidate for breast conservation. I talked to her about the option of a mastectomy and informed her that her expected overall survival would be equivalent between mastectomy and breast conservation, based upon randomized controlled data. She is enthusiastic about breast conservation.  The consensus at tumor board is to proceed with lumpectomy and sentinel lymph node biopsy. It may be possible to perform her hysterectomy at the same time. She will then be a candidate for adjuvant chemotherapy, and ultimately she will receive adjuvant radiotherapy. She will be referred to genetics. I also recommended a CT of her chest for staging purposes given her two cancers.  It was a pleasure meeting the patient today. We discussed the risks, benefits, and side effects of radiotherapy. I recommend radiotherapy to the right breast to reduce her risk of locoregional recurrence by 2/3.  We discussed that radiation would  take approximately 4-6 weeks to complete and that I would give the patient a few weeks to heal following surgery before starting treatment planning. Chemotherapy would precede radiotherapy. We spoke about acute effects including skin irritation and fatigue as well as much less common late effects including internal organ injury or irritation. We spoke about the latest technology that is used to minimize the risk of late effects for patients undergoing radiotherapy to the breast or chest wall. No guarantees of treatment were given. The patient is enthusiastic about proceeding with treatment. I look forward to participating in the patient's care.  I will await her referral back to me for postoperative follow-up and eventual CT simulation/treatment planning.  If radiotherapy is ultimately indicated for her endometrial cancer, I am happy to see her for that adjuvantly  as needed following surgery with Dr Denman George.   __________________________________________   [name], MD   This document serves as a record of services personally performed by [name], MD. It was created on her behalf by Rae Lips, a trained medical scribe. The creation of this record is based on the scribe's personal observations and the provider's statements to them. This document has been checked and approved by the attending provider.

## 2017-05-07 ENCOUNTER — Encounter: Payer: Self-pay | Admitting: Genetics

## 2017-05-07 ENCOUNTER — Encounter: Payer: Self-pay | Admitting: *Deleted

## 2017-05-07 ENCOUNTER — Ambulatory Visit (HOSPITAL_BASED_OUTPATIENT_CLINIC_OR_DEPARTMENT_OTHER): Payer: 59 | Admitting: Genetics

## 2017-05-07 ENCOUNTER — Ambulatory Visit (HOSPITAL_BASED_OUTPATIENT_CLINIC_OR_DEPARTMENT_OTHER)
Admission: RE | Admit: 2017-05-07 | Discharge: 2017-05-07 | Disposition: A | Discharge status: Home / Self Care | Payer: 59 | Source: Ambulatory Visit | Attending: Hematology | Admitting: Hematology

## 2017-05-07 ENCOUNTER — Other Ambulatory Visit (HOSPITAL_BASED_OUTPATIENT_CLINIC_OR_DEPARTMENT_OTHER): Payer: 59

## 2017-05-07 ENCOUNTER — Ambulatory Visit (HOSPITAL_COMMUNITY)
Admission: RE | Admit: 2017-05-07 | Discharge: 2017-05-07 | Disposition: A | Discharge status: Home / Self Care | Payer: 59 | Source: Ambulatory Visit | Attending: Obstetrics & Gynecology | Admitting: Obstetrics & Gynecology

## 2017-05-07 DIAGNOSIS — C541 Malignant neoplasm of endometrium: Secondary | ICD-10-CM | POA: Diagnosis not present

## 2017-05-07 DIAGNOSIS — Z800 Family history of malignant neoplasm of digestive organs: Secondary | ICD-10-CM

## 2017-05-07 DIAGNOSIS — C50211 Malignant neoplasm of upper-inner quadrant of right female breast: Secondary | ICD-10-CM

## 2017-05-07 DIAGNOSIS — Z8052 Family history of malignant neoplasm of bladder: Secondary | ICD-10-CM | POA: Diagnosis not present

## 2017-05-07 DIAGNOSIS — Z170 Estrogen receptor positive status [ER+]: Secondary | ICD-10-CM

## 2017-05-07 DIAGNOSIS — C50111 Malignant neoplasm of central portion of right female breast: Secondary | ICD-10-CM | POA: Diagnosis not present

## 2017-05-07 DIAGNOSIS — R911 Solitary pulmonary nodule: Secondary | ICD-10-CM | POA: Diagnosis not present

## 2017-05-07 DIAGNOSIS — Z8542 Personal history of malignant neoplasm of other parts of uterus: Secondary | ICD-10-CM | POA: Diagnosis not present

## 2017-05-07 MED ORDER — IOPAMIDOL (ISOVUE-300) INJECTION 61%
INTRAVENOUS | Status: AC
  Filled 2017-05-07: qty 75

## 2017-05-07 MED ORDER — IOPAMIDOL (ISOVUE-300) INJECTION 61%
75.0000 mL | Freq: Once | INTRAVENOUS | Status: AC | PRN
  Administered 2017-05-07: 75 mL via INTRAVENOUS

## 2017-05-07 NOTE — Progress Notes (Signed)
  Echocardiogram 2D Echocardiogram with Definity has been performed.  Tresa Res 05/07/2017, 3:57 PM

## 2017-05-07 NOTE — Progress Notes (Signed)
REFERRING PROVIDER: Truitt Merle, MD [address] [place], [place] [address]  PRIMARY PROVIDER:  [name], MD  PRIMARY REASON FOR VISIT:  1. Malignant neoplasm of upper-inner quadrant of right breast in female, estrogen receptor positive ([place])   2. Endometrial adenocarcinoma ([place])   3. Family history of colon cancer in father   61. Family history of bladder cancer      HISTORY OF PRESENT ILLNESS:   Dawn [name], a 65 y.o. female, was seen for a [place] cancer genetics consultation at the request of [name] due to a personal and family history of cancer.  Dawn [name] presents to clinic today to discuss the possibility of a hereditary predisposition to cancer, genetic testing, and to further clarify her future cancer risks, as well as potential cancer risks for family members.   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 She is currently discussing and making treatment decisions.   CANCER HISTORY:    Malignant neoplasm of upper-inner quadrant of right breast in female, estrogen receptor positive ([place])   04/27/2017 Initial Biopsy    Diagnosis Breast, right, needle core biopsy INVASIVE DUCTAL CARCINOMA, GRADE 2 Microscopic Comment The neoplasm has intracellular mucin and signet ring cell features. Immunostains shows these cells are positive for ER,GATA3, ck7 and GCDFP, negative for ck20, cdx2, TTF-1 and pax8, The immunostaining pattern supports the neoplasm is breast primary.      04/27/2017 Receptors her2    Estrogen Receptor: 70%, POSITIVE, MODERATE STAINING INTENSITY Progesterone Receptor: 0%, NEGATIVE Proliferation Marker Ki67: 30% HER2 - **POSITIVE** RATIO OF HER2/CEP17 SIGNALS 2.91 AVERAGE HER2 COPY NUMBER PER CELL 7.28      04/27/2017 Initial Diagnosis    Malignant neoplasm of upper-inner quadrant of right breast in female, estrogen receptor  positive ([place])       Mammogram           HORMONAL RISK FACTORS:  Menarche was at age 32.  First live birth at age 52.  OCP use for approximately 0 years.  Ovaries intact: yes.  Hysterectomy: no.  Menopausal status: postmenopausal. 66 yo HRT use: 0 years. Colonoscopy: yes; a few hyperplastic polyps identified. Mammogram within the last year: yes.   Past Medical History:  Diagnosis Date  . Anemia    as a child.  . Arthritis   . Bilateral cataracts   . Bilateral leg cramps   . Cancer ([place])    skin  . Dyspnea   . Family history of bladder cancer   . Family history of colon cancer in father   . Fuchs' corneal dystrophy   . GERD (gastroesophageal reflux disease)   . Hearing loss    Right side 30%  . Heart murmur    childhood  . History of hiatal hernia    small size  . Hyperlipidemia   . Hypertension   . IBS (irritable bowel syndrome)   . Malignant neoplasm of upper-inner quadrant of right female breast ([place])   . NAFL (nonalcoholic fatty liver)   . Obesity   . Pre-diabetes   . 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 Uterine cancer ([place])   . Vertigo     Past Surgical History:  Procedure Laterality Date  . BREAST BIOPSY Right 04/27/2017  . COLONOSCOPY    . HYSTEROSCOPY W/D&C N/A 04/29/2017   Procedure: DILATATION AND CURETTAGE /  HYSTEROSCOPY;  Surgeon: Linda Hedges, DO;  Location: [facility];  Service: Gynecology;  Laterality: N/A;    Social History   Social History  . Marital status: Married    Spouse name: N/A  . Number of children: N/A  . 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 Smoking status: Never Smoker  . Smokeless tobacco: Never Used  . Alcohol use 0.0 oz/week     Comment: <1/week wine or beer  . Drug use: No  . Sexual activity: Not on file   Other Topics Concern  . Not on file   Social History Narrative   2-3  caffeine drinks per day. Regular exercise.  Walking 3 miles a day.      FAMILY HISTORY:  We obtained a detailed, 4-generation family history.  Significant diagnoses are listed below:  Family History  Problem Relation Age of Onset  . Colon cancer Father 61  . Heart attack Father 61  . Prostate cancer Father        dx 33's  . Bladder Cancer Father 102  . Stroke Mother 37  . Diabetes Maternal Grandfather   . Kidney disease Maternal Grandfather   . Asthma Brother   . Cancer Paternal Grandmother 70       GYN cancer ( thinks ovarian, maybe uterine)  . Heart attack Maternal Grandmother 70  . Breast cancer Other 46  . Colon cancer Other 76  . Cancer Other        type unk, age dx unk   Dawn [name] has a 61 year-old daughter and a 53 year-old son with no history of cancer.  Dawn [name] has 1 sister and 3 brothers listed below: -1 sister is 20 with no history of cancer (utereus and ovaries still intact).  -1 brother is 65 with no history of cancer.  He has 2 daughters in their 5's with no history of cancer.  1 brother is 23 with no history of cancer. He has a son in his 55's with no cancer.   -1 brother is 11 with no history of cancer.  He has a 1 son and 3 daughters.  One of these children (patient's nieces/nephews) is 76/70 years old, and the others are in their 20's.    Dawn [name] father was diagnosed with colon cancer in his mid-50's.  He was later diagnosed with prostate cancer in his 21's, and then with bladder cancer at 65.  He died at 84, and had a history of smoking.  Dawn [name] has a paternal uncle who died in his late 54's from Parkinson's disease.  He had 2 sons in their 82's and 41's with no history of cancer.  Dawn [name] paternal grandfather died in his 48's or 81's in a war.  There is limited information about his family, but no known history of cancer.  Dawn [name] paternal grandmother died at 15 from a gynecological cancer.  The patient thinks it ws ovarian, but it could have  been uterine.  This paternal grandmother had a sister with breast cancer in her 57's, a sister with colon cancer in her 81's, and a brother with an unknown type of cancer.   Dawn [name] mother died at 4 from a stroke.  Dawn [name] has 1 maternal uncle and 3 maternal aunts listed below: -1 maternal uncle died at 33 due to a heart attack. She  had 5 children- 1 child died in his 5's from a blood cancer.  The other 4 children have no history of cancer.   -1 maternal aunt is in her 39's with no history of cancer.  He has 2 daughters and a son with no history of cancer.   -1 maternal aunt died in her late 60's/early 7-'s due to cardiomyopathy. She had 2 daughters with no history of cancer.  -1 maternal aunt died in her late 60's/early 70's due to a heart attack.   Dawn [name] maternal grandfather died in his late 57's70's. There is no other history of cancer known on his side of the family, but there is limited information.  Dawn [name] maternal grandmother died at 30 from a heart attack.  There is no known history of cancer on her side of the family.   Dawn [name] is unaware of previous family history of genetic testing for hereditary cancer risks. Patient's maternal ancestors are of [place] descent, and paternal ancestors are of [place] descent. There is no reported Ashkenazi Jewish ancestry. There is no known consanguinity.  GENETIC COUNSELING ASSESSMENT: Dawn [name] is a 66 y.o. female with a personal and family history which is somewhat suggestive of a Hereditary Cancer Predisposition Syndrome. We, therefore, discussed and recommended the following at today's visit.   DISCUSSION: We reviewed the characteristics, features and inheritance patterns of hereditary cancer syndromes. We also discussed genetic testing, including the appropriate family members to test, the process of testing, insurance coverage and turn-around-time for results. We discussed the implications of a negative, positive and/or  variant of uncertain significant result. In order to get genetic test results in a timely manner so that Dawn [name] can use these genetic test results for surgical decisions, we recommended Dawn [name] pursue genetic testing for the Breast Cancer STAT Panel. The STAT Breast cancer panel offered by [facility] includes sequencing and rearrangement analysis for the following 9 genes:  ATM, BRCA1, BRCA2, CDH1, CHEK2, PALB2, PTEN, STK11 and TP53.   If this test is negative, we then recommend Dawn [name]. [place] pursue reflex genetic testing to the [facility] Multi-Cancer gene panel. The Multi-Cancer Panel offered by [facility] includes sequencing and/or deletion duplication testing of the following 83 genes: ALK, APC, ATM, AXIN2,BAP1,  BARD1, BLM, BMPR1A, BRCA1, BRCA2, BRIP1, CASR, CDC73, CDH1, CDK4, CDKN1B, CDKN1C, CDKN2A (p14ARF), CDKN2A (p16INK4a), CEBPA, CHEK2, CTNNA1, DICER1, DIS3L2, EGFR (c.2369C>T, p.Thr790Met variant only), EPCAM (Deletion/duplication testing only), FH, FLCN, GATA2, GPC3, GREM1 (Promoter region deletion/duplication testing only), HOXB13 (c.251G>A, p.Gly84Glu), HRAS, KIT, MAX, MEN1, MET, MITF (c.952G>A, p.Glu318Lys variant only), MLH1, MSH2, MSH3, MSH6, MUTYH, NBN, NF1, NF2, NTHL1, PALB2, PDGFRA, PHOX2B, PMS2, POLD1, POLE, POT1, PRKAR1A, PTCH1, PTEN, RAD50, RAD51C, RAD51D, RB1, RECQL4, RET, RUNX1, SDHAF2, SDHA (sequence changes only), SDHB, SDHC, SDHD, SMAD4, SMARCA4, SMARCB1, SMARCE1, STK11, SUFU, TERC, TERT, TMEM127, TP53, TSC1, TSC2, VHL, WRN and WT1.   We discussed that only 5-10% of cancers are associated with a Hereditary Cancer Predisposition Syndrome.  The most common hereditary cancer syndrome associated with colon cancer is Lynch Syndrome.  Lynch Syndrome is caused by mutations in the genes: MLH1, MSH2, MSH6, PMS2 and EPCAM.  This syndrome increases the risk for colon, uterine, ovarian and stomach cancers, as well as others.  Families with Lynch Syndrome tend to have multiple family members with these  cancers, typically diagnosed under age 54, and diagnoses in multiple generations.   We discussed that there are several other genes that are associated with an increased risk for colon cancer and  increased polyp burden (MUTYH, APC, POLE, CHEK2, etc.)   One of the most common hereditary cancer syndromes that increases breast cancer risk is called Hereditary Breast and Ovarian Cancer (HBOC) syndrome.  This syndrome is caused by mutations in the BRCA1 and BRCA2 genes.  This syndrome increases an individual's lifetime risk to develop breast, ovarian, pancreatic, and other types of cancer.  There are also many other cancer predisposition syndromes caused by mutations in several other genes.  We discussed that if she is found to have a mutation in one of these genes, it may impact surgical decisions, and alter future medical management recommendations such as increased cancer screenings and consideration of risk reducing surgeries.  A positive result could also have implications for the patient's family members.  A Negative result would mean we were unable to identify a hereditary component to her cancer, but does not rule out the possibility of a hereditary basis for her cancer.  There could be mutations that are undetectable by current technology, or in genes not yet tested or identified to increase cancer risk.    We discussed the potential to find a Variant of Uncertain Significance or VUS.  These are variants that have not yet been identified as pathogenic or benign, and it is unknown if this variant is associated with increased cancer risk or if this is a normal finding.  Most VUS's are reclassified to benign or likely benign.   It should not be used to make medical management decisions. With time, we suspect the lab will determine the significance of any VUS's identified if any.   Based on Dawn [name]. [place] personal and family history of cancer, she meets medical criteria for genetic testing. Despite that she  meets criteria, she may still have an out of pocket cost. We discussed that if her out of pocket cost for testing is over $100, the laboratory will call and confirm whether she wants to proceed with testing.  If the out of pocket cost of testing is less than $100 she will be billed by the genetic testing laboratory.   In order to estimate her chance of having a Lynch Syndrome mutation, we used statistical model PREM. Because each model is different, there can be a lot of variability in the risks they give.  Therefore, these numbers must be considered a rough range and not a precise risk of having a Lynch Syndrome mutation.  These models estimate that she has approximately a 5.8% (with PGM ovarian  cancer)-6.7% (PGM uterine cancer) chance of having a mutation. Based on this assessment of her family and personal history, genetic testing is recommended.  PLAN: After considering the risks, benefits, and limitations, Dawn [name]. [place]  provided informed consent to pursue genetic testing and the blood sample was sent to [facility] for analysis of the Breast Cancer STAT Panel with reflex testing to the Multi-Cancer Panel. Preliminary Breast cancer panel results should be available within approximately 5-12 days' time, at which point they will be disclosed by telephone to Dawn [name]. [place], as will any additional recommendations warranted by these results. Dawn [name]. Mcqu[place] will receive a summary of her genetic counseling visit and a copy of her results once available. This information will also be available in Epic. We encouraged Dawn [name]. [place] to remain in contact with cancer genetics annually so that we can continuously update the family history and inform her of any changes in cancer genetics and testing that may be of benefit for her family. Dawn [name]. [place] questions were answered to  her satisfaction today. Our contact information was provided should additional questions or concerns arise.  Based on Dawn [name]. [place] family history, if  her paternal grandmother's gyn cancer was ovarian we recommended her siblings and paternal relatives (uncle/cousins), Dawn [name]. Guldin will let us know if we can be of any assistance in coordinating genetic counseling and/or testing for this family member.   Lastly, we encouraged Dawn [name]. Raz to remain in contact with cancer genetics annually so that we can continuously update the family history and inform her of any changes in cancer genetics and testing that may be of benefit for this family.   Dawn [name].  [place] questions were answered to her satisfaction today. Our contact information was provided should additional questions or concerns arise. Thank you for the referral and allowing [name] to share in the care of your patient.   Tana Felts, Dawn [name] Genetic Counselor [email or web address] phone: [phone]  The patient was seen for a total of 40 minutes in face-to-face genetic counseling. This patient was discussed with Drs. Magrinat, Lindi Adie and/or Burr Medico who agrees with the above.

## 2017-05-08 ENCOUNTER — Encounter: Payer: Self-pay | Admitting: General Practice

## 2017-05-08 LAB — HEPATIC FUNCTION PANEL
ALBUMIN: 4.3 g/dL (ref 3.6–4.8)
ALT: 157 IU/L — ABNORMAL HIGH (ref 0–32)
AST: 81 IU/L — AB (ref 0–40)
Alkaline Phosphatase, S: 46 IU/L (ref 39–117)
BILIRUBIN TOTAL: 0.3 mg/dL (ref 0.0–1.2)
BILIRUBIN, DIRECT: 0.11 mg/dL (ref 0.00–0.40)
Total Protein: 7.2 g/dL (ref 6.0–8.5)

## 2017-05-08 NOTE — Progress Notes (Signed)
[place] Psychosocial Distress Screening [place] by phone following Breast Multidisciplinary Clinic to introduce [place] team/resources, reviewing distress screen per protocol.  The patient scored a 8 on the Psychosocial Distress Thermometer which indicates severe distress. Also assessed for distress and other psychosocial needs.   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  She reports good support from husband, family, and church.  Per pt, being diagnosed with two different kinds of cancer and facing 18w of chemotherapy are two top stressors.    Follow up needed: Yes.  Referring to financial advocate per pt request and plan to mail full packet of [place], including handwritten note, on Monday.   [place], [place], [facility] Pager [phone] Voicemail [phone]

## 2017-05-11 ENCOUNTER — Ambulatory Visit (HOSPITAL_COMMUNITY)
Admission: RE | Admit: 2017-05-11 | Discharge: 2017-05-11 | Disposition: A | Discharge status: Home / Self Care | Payer: 59 | Source: Ambulatory Visit | Attending: Cardiology | Admitting: Cardiology

## 2017-05-11 ENCOUNTER — Encounter (HOSPITAL_COMMUNITY): Payer: Self-pay | Admitting: Cardiology

## 2017-05-11 VITALS — BP 126/76 | HR 96 | Wt 185.5 lb

## 2017-05-11 DIAGNOSIS — C50911 Malignant neoplasm of unspecified site of right female breast: Secondary | ICD-10-CM | POA: Diagnosis not present

## 2017-05-11 DIAGNOSIS — Z170 Estrogen receptor positive status [ER+]: Secondary | ICD-10-CM | POA: Insufficient documentation

## 2017-05-11 DIAGNOSIS — C50211 Malignant neoplasm of upper-inner quadrant of right female breast: Secondary | ICD-10-CM

## 2017-05-11 DIAGNOSIS — Z803 Family history of malignant neoplasm of breast: Secondary | ICD-10-CM | POA: Diagnosis not present

## 2017-05-11 DIAGNOSIS — C541 Malignant neoplasm of endometrium: Secondary | ICD-10-CM | POA: Insufficient documentation

## 2017-05-11 DIAGNOSIS — I10 Essential (primary) hypertension: Secondary | ICD-10-CM | POA: Diagnosis not present

## 2017-05-11 DIAGNOSIS — E785 Hyperlipidemia, unspecified: Secondary | ICD-10-CM | POA: Insufficient documentation

## 2017-05-11 DIAGNOSIS — K219 Gastro-esophageal reflux disease without esophagitis: Secondary | ICD-10-CM | POA: Diagnosis not present

## 2017-05-12 ENCOUNTER — Telehealth: Payer: Self-pay | Admitting: *Deleted

## 2017-05-12 NOTE — Progress Notes (Signed)
Oncologist: [name]  66 yo with history of HTN and hyperlipidemia with newly-diagnosed breast cancer and endometrial cancer. He was referred to cardio-oncology clinic by [name].   Breast cancer was diagnosed on the right in 8/18, ER+/PR+/HER2+.  Lumpectomy is planned.  Waiting for path from lumpectomy but will plan chemotherapy followed by Herceptin for 6 months. Endometrial cancer was also diagnosed in 8/18.  Will need hysterectomy.   No history of cardiac disease.  No significant exertional dyspnea or chest pain.  BP controlled today.   PMH: 1. GERD 2. HTN 3. Hyperlipidemia 4. Endometrial cancer: Diagnosed 8/18. Will need hysterectomy.  5. Breast cancer: Diagnosed on the right in 8/18, ER+/PR-/HER2+.  Lumpectomy is planned.  Waiting for path from lumpectomy but will plan chemotherapy followed by Herceptin for 6 months.  - Echo (8/18): EF 65-70%, GLS -18.2%, mild LVH.   Social History   Social History  . Marital status: Married    Spouse name: N/A  . Number of children: N/A  . 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 Smoking status: Never Smoker  . Smokeless tobacco: Never Used  . Alcohol use 0.0 oz/week     Comment: <1/week wine or beer  . Drug use: No  . Sexual activity: Not on file   Other Topics Concern  . Not on file   Social History Narrative   2-3 caffeine drinks per day. Regular exercise.  Walking 3 miles a day.    Family History  Problem Relation Age of Onset  . Colon cancer Father 11  . Heart attack Father 23  . Prostate cancer Father        dx 46's  . Bladder Cancer Father 7  . Stroke Mother 107  . Diabetes Maternal Grandfather   . Kidney disease Maternal Grandfather   . Asthma Brother   . Cancer Paternal Grandmother 64       GYN cancer ( thinks ovarian, maybe uterine)  . Heart attack Maternal Grandmother 70  . Breast cancer Other 87  . Colon cancer Other  82  . Cancer Other        type unk, age dx unk   ROS: All systems reviewed and negative except as per HPI.   Current Outpatient Prescriptions  Medication Sig Dispense Refill  . acetaminophen (TYLENOL) 325 MG tablet Take 650 mg by mouth every 6 (six) hours as needed for mild pain or moderate pain.    Marland Kitchen BENICAR HCT 20-12.5 MG tablet TAKE 1 TABLET BY MOUTH DAILY. 90 tablet 3  . ferrous sulfate 325 (65 FE) MG tablet Take 1 tablet (325 mg total) by mouth daily. 30 tablet 0  . Multiple Vitamin (MULTIVITAMIN) capsule Take 1 capsule by mouth daily.      . Pitavastatin Calcium (LIVALO) 2 MG TABS TAKE 1 & 1/2 TABLETS (3 MG TOTAL) BY MOUTH AT BEDTIME. 135 tablet 3  . raloxifene (EVISTA) 60 MG tablet Take 1 tablet by mouth daily.  3  . sodium chloride (MURO 128) 5 % ophthalmic ointment Place 1 application into both eyes at bedtime.    . vitamin C (ASCORBIC ACID) 500 MG tablet Take 1,000 mg by mouth daily.     . fluticasone (FLONASE) 50 MCG/ACT nasal spray Place 1 spray into both nostrils daily. I-2 SPRAYS AS NEEDED. (Patient not taking: Reported on 04/22/2017) 16 g 3  .  ibuprofen (ADVIL,MOTRIN) 600 MG tablet Take 1 tablet (600 mg total) by mouth every 6 (six) hours as needed. (Patient not taking: Reported on 05/06/2017) 30 tablet 0  . meclizine (ANTIVERT) 25 MG tablet Take 1-2 tablets (25-50 mg total) by mouth 3 (three) times daily as needed for dizziness. (Patient not taking: Reported on 05/06/2017) 30 tablet 0  . ondansetron (ZOFRAN) 4 MG tablet Take 1 tablet (4 mg total) by mouth every 8 (eight) hours as needed for nausea or vomiting. (Patient not taking: Reported on 05/11/2017) 12 tablet 0  . oxyCODONE-acetaminophen (ROXICET) 5-325 MG tablet Take 1-2 tablets by mouth every 4 (four) hours as needed for severe pain. (Patient not taking: Reported on 05/06/2017) 10 tablet 0   No current facility-administered medications for this encounter.    Blood pressure 126/76, pulse 96, weight 185 lb 8 oz (84.1 kg), SpO2  98 %. General: NAD Neck: No JVD, no thyromegaly or thyroid nodule.  Lungs: Clear to auscultation bilaterally with normal respiratory effort. CV: Nondisplaced PMI.  Heart regular S1/S2, no S3/S4, 1/6 early SEM RUSB  No peripheral edema.  No carotid bruit.  Normal pedal pulses.  Abdomen: Soft, nontender, no hepatosplenomegaly, no distention.  Skin: Intact without lesions or rashes.  Neurologic: Alert and oriented x 3.  Psych: Normal affect. Extremities: No clubbing or cyanosis.  HEENT: Normal.   Assessment/Plan: 1. Breast cancer: ER+/PR-/HER2+.  She will be getting a Herceptin-based regimen, has not yet started.   Baseline echo looks good, normal EF and strain pattern.  We discussed the risk of cardiotoxicity with Herceptin and the rationale behind echo screening.  - Repeat echo in 3 months with office visit.  2. Endometrial cancer: She will need a hysterectomy.  3. HTN: BP controlled.   Loralie Champagne 05/12/2017

## 2017-05-12 NOTE — Telephone Encounter (Signed)
  Oncology Nurse Navigator Documentation  Navigator Location: [facility] (05/12/17 0900)   )Navigator Encounter Type: Telephone (05/12/17 0900) Telephone: Outgoing Call;Clinic/MDC Follow-up (05/12/17 0900)                                                  Time Spent with Patient: 15 (05/12/17 0900)

## 2017-05-13 ENCOUNTER — Encounter: Payer: Self-pay | Admitting: Hematology

## 2017-05-13 ENCOUNTER — Encounter: Payer: Self-pay | Admitting: General Practice

## 2017-05-13 NOTE — Progress Notes (Signed)
[facility] Spiritual Care Note  Mailed handwritten note of encouragement with full packet of [place] team/resource information.   [place], [place], [facility] Pager [phone] Voicemail [phone]

## 2017-05-13 NOTE — Progress Notes (Signed)
Received staff message from Lisa(chaplain) regarding patient with concerns regarding bills.  Reviewed patients's chart and there is currently no active treatment plan.   Called patient to introduce myself and follow up and she states she does not have any questions for me now but took my name and number for any additional questions or concerns once she looks through her information.

## 2017-05-15 ENCOUNTER — Telehealth: Payer: Self-pay | Admitting: Family Medicine

## 2017-05-15 ENCOUNTER — Other Ambulatory Visit: Payer: Self-pay | Admitting: Family Medicine

## 2017-05-15 MED ORDER — PITAVASTATIN CALCIUM 2 MG PO TABS: ORAL_TABLET | ORAL | 3 refills | Status: DC

## 2017-05-15 MED ORDER — BENZONATATE 200 MG PO CAPS: 200.0000 mg | ORAL_CAPSULE | Freq: Three times a day (TID) | ORAL | 0 refills | Status: DC | PRN

## 2017-05-15 MED FILL — BENZONATATE 200 MG CAPSULE: 200 | 10 days supply | Qty: 30 | Fill #0

## 2017-05-15 NOTE — Telephone Encounter (Signed)
Call patient and spoke with her about her recent diagnosis of breast cancer. She's can be probably having surgery in the next couple of weeks and then will start chemotherapy. We did discuss going ahead and getting her flu shot ahead of time before she starts chemotherapy. She is doing well emotionally overall but still struggling at times. She has had a persistent cough ever since she had a recent procedure. They did scan her chest to rule out anything worrisome. Will call in [facility] for her. 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 At some point I would like to schedule her for liver ultrasound the last time her feet on since she has known fatty liver disease but certainly this isn't urgent. We also discussed that for now her pressure appointment out probably a couple of months and she will have 18 weeks to chemotherapy she was originally due to come in September. We'll go ahead and refill her statin today and she should be good on her blood pressure pill until we can follow up.  Dawn Lecher, MD

## 2017-05-15 NOTE — Telephone Encounter (Signed)
Received PA on Livalo 2mg  sent through cover my meds waiting on determination.  - CF

## 2017-05-15 NOTE — Progress Notes (Signed)
Call patient and spoke with her about her recent diagnosis of breast cancer. She's can be probably having surgery in the next couple of weeks and then will start chemotherapy. We did discuss going ahead and getting her flu shot ahead of time before she starts chemotherapy. She is doing well emotionally overall but still struggling at times. She has had a persistent cough ever since she had a recent procedure. They did scan her chest to rule out anything worrisome. Will call in [facility] for her. 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 At some point I would like to schedule her for liver ultrasound the last time her feet on since she has known fatty liver disease but certainly this isn't urgent. We also discussed that for now her pressure appointment out probably a couple of months and she will have 18 weeks to chemotherapy she was originally due to come in September. We'll go ahead and refill her statin today and she should be good on her blood pressure pill until we can follow up.  Beatrice Lecher, MD

## 2017-05-19 ENCOUNTER — Telehealth: Payer: Self-pay | Admitting: Genetic Counselor

## 2017-05-19 ENCOUNTER — Encounter: Payer: Self-pay | Admitting: Genetic Counselor

## 2017-05-19 DIAGNOSIS — Z1379 Encounter for other screening for genetic and chromosomal anomalies: Secondary | ICD-10-CM | POA: Insufficient documentation

## 2017-05-19 NOTE — Telephone Encounter (Signed)
Revealed negative genetic testing on the 9 gene STAT panel.  Discussed that this result would not change the surgical decisions that are currently made.  Confirmed that patient wanted to rereq to the [facility] panel as discussed by Ria Comment.  She wants to proceed with this.  I will let Drs. Denman George and [place] know that the STAT panel was negative.

## 2017-05-20 ENCOUNTER — Encounter: Payer: Self-pay | Admitting: Gynecologic Oncology

## 2017-05-20 ENCOUNTER — Ambulatory Visit: Payer: 59 | Attending: Gynecologic Oncology | Admitting: Gynecologic Oncology

## 2017-05-20 VITALS — BP 130/76 | HR 77 | Temp 98.4°F | Resp 18 | Ht 62.99 in | Wt 182.0 lb

## 2017-05-20 DIAGNOSIS — C541 Malignant neoplasm of endometrium: Secondary | ICD-10-CM

## 2017-05-20 DIAGNOSIS — E785 Hyperlipidemia, unspecified: Secondary | ICD-10-CM | POA: Insufficient documentation

## 2017-05-20 DIAGNOSIS — K219 Gastro-esophageal reflux disease without esophagitis: Secondary | ICD-10-CM | POA: Insufficient documentation

## 2017-05-20 DIAGNOSIS — Z880 Allergy status to penicillin: Secondary | ICD-10-CM | POA: Diagnosis not present

## 2017-05-20 DIAGNOSIS — R7303 Prediabetes: Secondary | ICD-10-CM | POA: Diagnosis not present

## 2017-05-20 DIAGNOSIS — C50911 Malignant neoplasm of unspecified site of right female breast: Secondary | ICD-10-CM | POA: Diagnosis not present

## 2017-05-20 DIAGNOSIS — E669 Obesity, unspecified: Secondary | ICD-10-CM | POA: Insufficient documentation

## 2017-05-20 DIAGNOSIS — Z800 Family history of malignant neoplasm of digestive organs: Secondary | ICD-10-CM

## 2017-05-20 DIAGNOSIS — C50211 Malignant neoplasm of upper-inner quadrant of right female breast: Secondary | ICD-10-CM | POA: Diagnosis not present

## 2017-05-20 DIAGNOSIS — Z79899 Other long term (current) drug therapy: Secondary | ICD-10-CM | POA: Diagnosis not present

## 2017-05-20 DIAGNOSIS — Z170 Estrogen receptor positive status [ER+]: Secondary | ICD-10-CM | POA: Diagnosis not present

## 2017-05-20 DIAGNOSIS — Z8042 Family history of malignant neoplasm of prostate: Secondary | ICD-10-CM | POA: Diagnosis not present

## 2017-05-20 DIAGNOSIS — I10 Essential (primary) hypertension: Secondary | ICD-10-CM | POA: Diagnosis not present

## 2017-05-20 DIAGNOSIS — K589 Irritable bowel syndrome without diarrhea: Secondary | ICD-10-CM | POA: Diagnosis not present

## 2017-05-20 DIAGNOSIS — Z8052 Family history of malignant neoplasm of bladder: Secondary | ICD-10-CM | POA: Diagnosis not present

## 2017-05-20 NOTE — Progress Notes (Signed)
Consult Note: Gyn-Onc  Consult was requested by Dr. Lynnette Caffey for the evaluation of Dawn [name] 66 y.o. female  CC:  Chief Complaint  Patient presents with  . Endometrial cancer [facility])    Assessment/Plan:  [name]  is a 66 y.o.  year old with endometrial cancer. She also has a synchronous diagnosis of breast cancer (ER pos, PR neg, Her2-neu pos).  A detailed discussion was held with the patient and her family with regard to to her endometrial cancer diagnosis. We discussed the standard management options for uterine cancer which includes surgery followed possibly by adjuvant therapy depending on the results of surgery. The options for surgical management include a hysterectomy and removal of the tubes and ovaries possibly with removal of pelvic and para-aortic lymph nodes.If feasible, a minimally invasive approach including a robotic hysterectomy or laparoscopic hysterectomy have benefits including shorter hospital stay, recovery time and better wound healing than with open surgery. The patient has been counseled about these surgical options and the risks of surgery in general including infection, bleeding, damage to surrounding structures (including bowel, bladder, ureters, nerves or vessels), and the postoperative risks of PE/ DVT, and lymphedema. I extensively reviewed the additional risks of robotic hysterectomy including possible need for conversion to open laparotomy.  I discussed positioning during surgery of trendelenberg and risks of minor facial swelling and care we take in preoperative positioning.  After counseling and consideration of her options, she desires to proceed with robotic assisted total hysterectomy with bilateral sapingo-oophorectomy and SLN biopsy.   She will be seen by anesthesia for preoperative clearance and discussion of postoperative pain management.  She was given the opportunity to ask questions, which were answered to her satisfaction, and she is  agreement with the above mentioned plan of care.  She is interested in a combined procedure with the breast surgeon, however, the SLN biopsy equipment for breast cancer is exclusively available at [facility] and the SLN biopsy equipment for endometrial cancer is exclusively located at [facility]. Therefore we will have to perform these procedures separately.  However, I do not believe that the recovery from either will impact the timing of these two surgeries. I would want her to delay chemotherapy and radiation until the endometrial cancer surgery is completed.   HPI: Dawn [name] is a 66 year old P2 who is seen in consultation at the request of Dr Lynnette Caffey for endometrial cancer. The patient began having symptoms of bloating and postmenopausal bleeding since mid July, 2018.   Dr Lynnette Caffey performed an [name] which showed a thickened endometrium. Pap was normal in May, 2018.  On 04/29/17 she underwent D&C which showed endometrioid adenocarcinoma, there was some question as to whether this was endometrial or endocervical in origin.   In the interim the patient underwent routine mammography which revealed a suspicious lesion on the right. This was biopsied and showed invasive ductal carcinoma, grade 2, ER pos, PR neg, Her2 neu pos. She was recommended to have lumpectomy, SLN biopsy and chemo with radiation.  Genetic testing for BRCA was negative.  Current Meds:  Outpatient Encounter Prescriptions as of 05/20/2017  Medication Sig  . acetaminophen (TYLENOL) 325 MG tablet Take 650 mg by mouth every 6 (six) hours as needed for mild pain or moderate pain.  Marland Kitchen BENICAR HCT 20-12.5 MG tablet TAKE 1 TABLET BY MOUTH DAILY.  . benzonatate (TESSALON) 200 MG capsule Take 1 capsule (200 mg total) by mouth 3 (three) times daily as needed for  cough.  . Multiple Vitamin (MULTIVITAMIN) capsule Take 1 capsule by mouth daily.    . sodium chloride (MURO 128) 5 % ophthalmic ointment Place 1 application into both  eyes at bedtime.  . vitamin C (ASCORBIC ACID) 500 MG tablet Take 1,000 mg by mouth daily.   . [DISCONTINUED] raloxifene (EVISTA) 60 MG tablet Take 1 tablet by mouth daily.  . ferrous sulfate 325 (65 FE) MG tablet Take 1 tablet (325 mg total) by mouth daily. (Patient not taking: Reported on 05/20/2017)  . fluticasone (FLONASE) 50 MCG/ACT nasal spray Place 1 spray into both nostrils daily. I-2 SPRAYS AS NEEDED. (Patient not taking: Reported on 05/20/2017)  . ibuprofen (ADVIL,MOTRIN) 600 MG tablet Take 1 tablet (600 mg total) by mouth every 6 (six) hours as needed. (Patient not taking: Reported on 05/20/2017)  . meclizine (ANTIVERT) 25 MG tablet Take 1-2 tablets (25-50 mg total) by mouth 3 (three) times daily as needed for dizziness. (Patient not taking: Reported on 05/20/2017)  . ondansetron (ZOFRAN) 4 MG tablet Take 1 tablet (4 mg total) by mouth every 8 (eight) hours as needed for nausea or vomiting. (Patient not taking: Reported on 05/20/2017)  . oxyCODONE-acetaminophen (ROXICET) 5-325 MG tablet Take 1-2 tablets by mouth every 4 (four) hours as needed for severe pain. (Patient not taking: Reported on 05/20/2017)  . Pitavastatin Calcium (LIVALO) 2 MG TABS TAKE 1 & 1/2 TABLETS (3 MG TOTAL) BY MOUTH AT BEDTIME. (Patient not taking: Reported on 05/20/2017)   No facility-administered encounter medications on file as of 05/20/2017.     Allergy:  Allergies  Allergen Reactions  . Crestor [Rosuvastatin Calcium] Other (See Comments)    Myalgias   . Penicillins Rash    Has patient had a PCN reaction causing immediate rash, facial/tongue/throat swelling, SOB or lightheadedness with hypotension: Yes Has patient had a PCN reaction causing severe rash involving mucus membranes or skin necrosis: Yes Has patient had a PCN reaction that required hospitalization: No Has patient had a PCN reaction occurring within the last 10 years: No If all of the above answers are "NO", then may proceed with Cephalosporin use.      Social Hx:   Social History   Social History  . Marital status: Married    Spouse name: N/A  . Number of children: N/A  . Years of education: N/A   Occupational History  .  [employer]    Social History Main Topics  . Smoking status: Never Smoker  . Smokeless tobacco: Never Used  . Alcohol use 0.0 oz/week     Comment: <1/week wine or beer  . Drug use: No  . Sexual activity: Not on file   Other Topics Concern  . Not on file   Social History Narrative   2-3 caffeine drinks per day. Regular exercise.  Walking 3 miles a day.     Past Surgical Hx:  Past Surgical History:  Procedure Laterality Date  . BREAST BIOPSY Right 04/27/2017  . COLONOSCOPY    . HYSTEROSCOPY W/D&C N/A 04/29/2017   Procedure: DILATATION AND CURETTAGE /HYSTEROSCOPY;  Surgeon: Linda Hedges, DO;  Location: [facility];  Service: Gynecology;  Laterality: N/A;    Past Medical Hx:  Past Medical History:  Diagnosis Date  . Anemia    as a child.  . Arthritis   . Bilateral cataracts   . Bilateral leg cramps   . Cancer ([place])    skin  . Dyspnea   .  Family history of bladder cancer   . Family history of colon cancer in father   . Fuchs' corneal dystrophy   . GERD (gastroesophageal reflux disease)   . Hearing loss    Right side 30%  . Heart murmur    childhood  . History of hiatal hernia    small size  . Hyperlipidemia   . Hypertension   . IBS (irritable bowel syndrome)   . Malignant neoplasm of upper-inner quadrant of right female breast ([place])   . NAFL (nonalcoholic fatty liver)   . Obesity   . Pre-diabetes   . 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 Uterine cancer ([place])   . Vertigo     Past Gynecological History:  SVD x 2 No LMP recorded. Patient is postmenopausal.  Family Hx:  Family History  Problem Relation Age of Onset  . Colon cancer Father 88  . Heart attack Father 47  . Prostate cancer Father        dx 52's   . Bladder Cancer Father 53  . Stroke Mother 35  . Diabetes Maternal Grandfather   . Kidney disease Maternal Grandfather   . Asthma Brother   . Cancer Paternal Grandmother 57       GYN cancer ( thinks ovarian, maybe uterine)  . Heart attack Maternal Grandmother 70  . Breast cancer Other 6  . Colon cancer Other 27  . Cancer Other        type unk, age dx unk    Review of Systems:  Constitutional  Feels well,    ENT Normal appearing ears and nares bilaterally Skin/Breast  No rash, sores, jaundice, itching, dryness Cardiovascular  No chest pain, shortness of breath, or edema  Pulmonary  No cough or wheeze.  Gastro Intestinal  No nausea, vomitting, or diarrhoea. No bright red blood per rectum, no abdominal pain, change in bowel movement, or constipation.  Genito Urinary  No frequency, urgency, dysuria, + bleeding Musculo Skeletal  No myalgia, arthralgia, joint swelling or pain  Neurologic  No weakness, numbness, change in gait,  Psychology  No depression, anxiety, insomnia.   Vitals:  Blood pressure 130/76, pulse 77, temperature 98.4 F (36.9 C), temperature source Oral, resp. rate 18, height 5' 2.99" (1.6 m), weight 182 lb (82.6 kg), SpO2 100 %.  Physical Exam: WD in NAD Neck  Supple NROM, without any enlargements.  Lymph Node Survey No cervical supraclavicular or inguinal adenopathy Cardiovascular  Pulse normal rate, regularity and rhythm. S1 and S2 normal.  Lungs  Clear to auscultation bilateraly, without wheezes/crackles/rhonchi. Good air movement.  Skin  No rash/lesions/breakdown  Psychiatry  Alert and oriented to person, place, and time  Abdomen  Normoactive bowel sounds, abdomen soft, non-tender and obese without evidence of hernia.  Back No CVA tenderness Genito Urinary  Vulva/vagina: Normal external female genitalia.  No lesions. No discharge or bleeding.  Bladder/urethra:  No lesions or masses, well supported bladder  Vagina: normal  Cervix: Normal  appearing, no lesions.  Uterus:  Small, mobile, no parametrial involvement or nodularity.  Adnexa: no masses. Rectal  Good tone, no masses no cul de sac nodularity.  Extremities  No bilateral cyanosis, clubbing or edema.   Donaciano Eva, MD  05/20/2017, 11:58 AM

## 2017-05-20 NOTE — Patient Instructions (Signed)
Preparing for your Surgery  Plan for surgery on June 09, 2017 with Dr. Everitt Amber at [place] will be scheduled for a robotic assisted total abdominal hysterectomy, bilateral salpingo-oophorectomy, sentinel lymph node biopsy.  Pre-operative Testing -You will receive a phone call from presurgical testing at [facility] to arrange for a pre-operative testing appointment before your surgery.  This appointment normally occurs one to two weeks before your scheduled surgery.   -Bring your insurance card, copy of an advanced directive if applicable, medication list  -At that visit, you will be asked to sign a consent for a possible blood transfusion in case a transfusion becomes necessary during surgery.  The need for a blood transfusion is rare but having consent is a necessary part of your care.     -You should not be taking blood thinners or aspirin at least ten days prior to surgery unless instructed by your surgeon.  Day Before Surgery at [facility] will be asked to take in a light diet the day before surgery.  Avoid carbonated beverages.  You will be advised to have nothing to eat or drink after midnight the evening before.     Eat a light diet the day before surgery.  Examples including soups, broths, toast, yogurt, mashed potatoes.  Things to avoid include carbonated beverages (fizzy beverages), raw fruits and raw vegetables, or beans.    If your bowels are filled with gas, your surgeon will have difficulty visualizing your pelvic organs which increases your surgical risks.  Your role in recovery Your role is to become active as soon as directed by your doctor, while still giving yourself time to heal.  Rest when you feel tired. You will be asked to do the following in order to speed your recovery:  - Cough and breathe deeply. This helps toclear and expand your lungs and can prevent pneumonia. You may be given a spirometer to practice deep  breathing. A staff member will show you how to use the spirometer. - Do mild physical activity. Walking or moving your legs help your circulation and body functions return to normal. A staff member will help you when you try to walk and will provide you with simple exercises. Do not try to get up or walk alone the first time. - Actively manage your pain. Managing your pain lets you move in comfort. We will ask you to rate your pain on a scale of zero to 10. It is your responsibility to tell your doctor or nurse where and how much you hurt so your pain can be treated.  Special Considerations -If you are diabetic, you may be placed on insulin after surgery to have closer control over your blood sugars to promote healing and recovery.  This does not mean that you will be discharged on insulin.  If applicable, your oral antidiabetics will be resumed when you are tolerating a solid diet.  -Your final pathology results from surgery should be available by the Friday after surgery and the results will be relayed to you when available.   Blood Transfusion Information WHAT IS A BLOOD TRANSFUSION? A transfusion is the replacement of blood or some of its parts. Blood is made up of multiple cells which provide different functions.  Red blood cells carry oxygen and are used for blood loss replacement.  White blood cells fight against infection.  Platelets control bleeding.  Plasma helps clot blood.  Other blood products are available for specialized needs, such as  hemophilia or other clotting disorders. BEFORE THE TRANSFUSION  Who gives blood for transfusions?   You may be able to donate blood to be used at a later date on yourself (autologous donation).  Relatives can be asked to donate blood. This is generally not any safer than if you have received blood from a stranger. The same precautions are taken to ensure safety when a relative's blood is donated.  Healthy volunteers who are fully evaluated  to make sure their blood is safe. This is blood bank blood. Transfusion therapy is the safest it has ever been in the practice of medicine. Before blood is taken from a donor, a complete history is taken to make sure that person has no history of diseases nor engages in risky social behavior (examples are intravenous drug use or sexual activity with multiple partners). The donor's travel history is screened to minimize risk of transmitting infections, such as malaria. The donated blood is tested for signs of infectious diseases, such as HIV and hepatitis. The blood is then tested to be sure it is compatible with you in order to minimize the chance of a transfusion reaction. If you or a relative donates blood, this is often done in anticipation of surgery and is not appropriate for emergency situations. It takes many days to process the donated blood. RISKS AND COMPLICATIONS Although transfusion therapy is very safe and saves many lives, the main dangers of transfusion include:   Getting an infectious disease.  Developing a transfusion reaction. This is an allergic reaction to something in the blood you were given. Every precaution is taken to prevent this. The decision to have a blood transfusion has been considered carefully by your caregiver before blood is given. Blood is not given unless the benefits outweigh the risks.

## 2017-05-21 ENCOUNTER — Encounter (HOSPITAL_BASED_OUTPATIENT_CLINIC_OR_DEPARTMENT_OTHER): Payer: Self-pay | Admitting: *Deleted

## 2017-05-21 MED FILL — LIVALO 2 MG TABLET: 2 | 90 days supply | Qty: 135 | Fill #0

## 2017-05-21 NOTE — Progress Notes (Signed)
Ensure pre surgery drink given with instructions to complete by 0615 dos, pt verbalized understanding. 

## 2017-05-21 NOTE — Telephone Encounter (Signed)
Received fax from [facility] and Livalo 2 mg has been approved for a maximum of 12 fills from 05/15/17 - 05/14/18. This request is approved for up to 1.5 tablets per day.    PA reference # [id number]   I will notify patient's pharmacy - [facility]

## 2017-05-22 ENCOUNTER — Telehealth: Payer: Self-pay | Admitting: Hematology

## 2017-05-22 DIAGNOSIS — C50211 Malignant neoplasm of upper-inner quadrant of right female breast: Secondary | ICD-10-CM | POA: Diagnosis not present

## 2017-05-22 NOTE — Telephone Encounter (Signed)
Spoke with pt and got post-op appt scheduled.

## 2017-05-25 ENCOUNTER — Ambulatory Visit (HOSPITAL_BASED_OUTPATIENT_CLINIC_OR_DEPARTMENT_OTHER)
Admission: RE | Admit: 2017-05-25 | Discharge: 2017-05-25 | Disposition: A | Discharge status: Home / Self Care | Payer: 59 | Source: Ambulatory Visit | Attending: General Surgery | Admitting: General Surgery

## 2017-05-25 ENCOUNTER — Encounter (HOSPITAL_BASED_OUTPATIENT_CLINIC_OR_DEPARTMENT_OTHER)
Admission: RE | Disposition: A | Discharge status: Home / Self Care | Payer: Self-pay | Source: Ambulatory Visit | Attending: General Surgery

## 2017-05-25 ENCOUNTER — Ambulatory Visit (HOSPITAL_COMMUNITY): Payer: 59

## 2017-05-25 ENCOUNTER — Ambulatory Visit (HOSPITAL_BASED_OUTPATIENT_CLINIC_OR_DEPARTMENT_OTHER): Payer: 59 | Admitting: Anesthesiology

## 2017-05-25 ENCOUNTER — Encounter (HOSPITAL_COMMUNITY)
Admission: RE | Admit: 2017-05-25 | Discharge: 2017-05-25 | Disposition: A | Discharge status: Home / Self Care | Payer: 59 | Source: Ambulatory Visit | Attending: General Surgery | Admitting: General Surgery

## 2017-05-25 ENCOUNTER — Encounter (HOSPITAL_BASED_OUTPATIENT_CLINIC_OR_DEPARTMENT_OTHER): Payer: Self-pay | Admitting: *Deleted

## 2017-05-25 DIAGNOSIS — Z8719 Personal history of other diseases of the digestive system: Secondary | ICD-10-CM | POA: Diagnosis not present

## 2017-05-25 DIAGNOSIS — Z1501 Genetic susceptibility to malignant neoplasm of breast: Secondary | ICD-10-CM | POA: Diagnosis not present

## 2017-05-25 DIAGNOSIS — G8918 Other acute postprocedural pain: Secondary | ICD-10-CM | POA: Diagnosis not present

## 2017-05-25 DIAGNOSIS — C50211 Malignant neoplasm of upper-inner quadrant of right female breast: Secondary | ICD-10-CM | POA: Diagnosis not present

## 2017-05-25 DIAGNOSIS — Z85820 Personal history of malignant melanoma of skin: Secondary | ICD-10-CM | POA: Insufficient documentation

## 2017-05-25 DIAGNOSIS — Z95828 Presence of other vascular implants and grafts: Secondary | ICD-10-CM

## 2017-05-25 DIAGNOSIS — C50911 Malignant neoplasm of unspecified site of right female breast: Secondary | ICD-10-CM

## 2017-05-25 DIAGNOSIS — E7800 Pure hypercholesterolemia, unspecified: Secondary | ICD-10-CM | POA: Diagnosis not present

## 2017-05-25 DIAGNOSIS — M1990 Unspecified osteoarthritis, unspecified site: Secondary | ICD-10-CM | POA: Insufficient documentation

## 2017-05-25 DIAGNOSIS — K219 Gastro-esophageal reflux disease without esophagitis: Secondary | ICD-10-CM | POA: Insufficient documentation

## 2017-05-25 DIAGNOSIS — E669 Obesity, unspecified: Secondary | ICD-10-CM | POA: Diagnosis not present

## 2017-05-25 DIAGNOSIS — Z170 Estrogen receptor positive status [ER+]: Principal | ICD-10-CM

## 2017-05-25 DIAGNOSIS — R079 Chest pain, unspecified: Secondary | ICD-10-CM | POA: Diagnosis not present

## 2017-05-25 DIAGNOSIS — Z6832 Body mass index (BMI) 32.0-32.9, adult: Secondary | ICD-10-CM | POA: Insufficient documentation

## 2017-05-25 DIAGNOSIS — Z419 Encounter for procedure for purposes other than remedying health state, unspecified: Secondary | ICD-10-CM

## 2017-05-25 DIAGNOSIS — F419 Anxiety disorder, unspecified: Secondary | ICD-10-CM | POA: Insufficient documentation

## 2017-05-25 DIAGNOSIS — I10 Essential (primary) hypertension: Secondary | ICD-10-CM | POA: Diagnosis not present

## 2017-05-25 HISTORY — PX: PORTACATH PLACEMENT: SHX2246

## 2017-05-25 HISTORY — PX: BREAST LUMPECTOMY WITH RADIOACTIVE SEED AND SENTINEL LYMPH NODE BIOPSY: SHX6550

## 2017-05-25 SURGERY — BREAST LUMPECTOMY WITH RADIOACTIVE SEED AND SENTINEL LYMPH NODE BIOPSY
Anesthesia: General | Site: Chest | Laterality: Right

## 2017-05-25 MED ORDER — SCOPOLAMINE 1 MG/3DAYS TD PT72: 1.0000 | MEDICATED_PATCH | Freq: Once | TRANSDERMAL | Status: DC | PRN

## 2017-05-25 MED ORDER — HEPARIN (PORCINE) IN NACL 2-0.9 UNIT/ML-% IJ SOLN: INTRAMUSCULAR | Status: DC | PRN

## 2017-05-25 MED ORDER — ROPIVACAINE HCL 5 MG/ML IJ SOLN
INTRAMUSCULAR | Status: DC | PRN
  Administered 2017-05-25: 25 mL via PERINEURAL

## 2017-05-25 MED ORDER — VANCOMYCIN HCL IN DEXTROSE 1-5 GM/200ML-% IV SOLN
1000.0000 mg | Freq: Once | INTRAVENOUS | Status: AC
  Administered 2017-05-25 (×2): 1000 mg via INTRAVENOUS

## 2017-05-25 MED ORDER — MIDAZOLAM HCL 2 MG/2ML IJ SOLN
INTRAMUSCULAR | Status: AC
  Filled 2017-05-25: qty 2

## 2017-05-25 MED ORDER — CHLORHEXIDINE GLUCONATE CLOTH 2 % EX PADS: 6.0000 | MEDICATED_PAD | Freq: Once | CUTANEOUS | Status: DC

## 2017-05-25 MED ORDER — LACTATED RINGERS IV SOLN
INTRAVENOUS | Status: DC
  Administered 2017-05-25 (×2): via INTRAVENOUS

## 2017-05-25 MED ORDER — CELECOXIB 200 MG PO CAPS
ORAL_CAPSULE | ORAL | Status: AC
  Filled 2017-05-25: qty 2

## 2017-05-25 MED ORDER — TECHNETIUM TC 99M SULFUR COLLOID FILTERED
1.0000 | Freq: Once | INTRAVENOUS | Status: AC | PRN
  Administered 2017-05-25: 1 via INTRADERMAL

## 2017-05-25 MED ORDER — ONDANSETRON HCL 4 MG/2ML IJ SOLN: 4.0000 mg | Freq: Four times a day (QID) | INTRAMUSCULAR | Status: DC | PRN

## 2017-05-25 MED ORDER — ACETAMINOPHEN 500 MG PO TABS
1000.0000 mg | ORAL_TABLET | ORAL | Status: AC
  Administered 2017-05-25: 1000 mg via ORAL

## 2017-05-25 MED ORDER — OXYCODONE HCL 5 MG PO TABS: 5.0000 mg | ORAL_TABLET | Freq: Once | ORAL | Status: DC | PRN

## 2017-05-25 MED ORDER — DEXAMETHASONE SODIUM PHOSPHATE 10 MG/ML IJ SOLN
INTRAMUSCULAR | Status: AC
  Filled 2017-05-25: qty 1

## 2017-05-25 MED ORDER — DEXAMETHASONE SODIUM PHOSPHATE 10 MG/ML IJ SOLN
INTRAMUSCULAR | Status: DC | PRN
  Administered 2017-05-25: 5 mg via INTRAVENOUS

## 2017-05-25 MED ORDER — ONDANSETRON HCL 4 MG/2ML IJ SOLN
INTRAMUSCULAR | Status: AC
  Filled 2017-05-25: qty 2

## 2017-05-25 MED ORDER — GABAPENTIN 300 MG PO CAPS
300.0000 mg | ORAL_CAPSULE | ORAL | Status: AC
  Administered 2017-05-25: 300 mg via ORAL

## 2017-05-25 MED ORDER — HEPARIN (PORCINE) IN NACL 2-0.9 UNIT/ML-% IJ SOLN
INTRAMUSCULAR | Status: AC | PRN
  Administered 2017-05-25: 1 via INTRAVENOUS

## 2017-05-25 MED ORDER — ONDANSETRON HCL 4 MG/2ML IJ SOLN
INTRAMUSCULAR | Status: DC | PRN
  Administered 2017-05-25: 4 mg via INTRAVENOUS

## 2017-05-25 MED ORDER — OXYCODONE HCL 5 MG/5ML PO SOLN: 5.0000 mg | Freq: Once | ORAL | Status: DC | PRN

## 2017-05-25 MED ORDER — EPHEDRINE 5 MG/ML INJ
INTRAVENOUS | Status: AC
  Filled 2017-05-25: qty 20

## 2017-05-25 MED ORDER — HEPARIN SOD (PORK) LOCK FLUSH 100 UNIT/ML IV SOLN
INTRAVENOUS | Status: DC | PRN
  Administered 2017-05-25: 500 [IU] via INTRAVENOUS

## 2017-05-25 MED ORDER — GABAPENTIN 300 MG PO CAPS
ORAL_CAPSULE | ORAL | Status: AC
  Filled 2017-05-25: qty 1

## 2017-05-25 MED ORDER — BUPIVACAINE-EPINEPHRINE (PF) 0.5% -1:200000 IJ SOLN
INTRAMUSCULAR | Status: DC | PRN
  Administered 2017-05-25: 18 mL

## 2017-05-25 MED ORDER — FENTANYL CITRATE (PF) 100 MCG/2ML IJ SOLN
INTRAMUSCULAR | Status: AC
  Filled 2017-05-25: qty 2

## 2017-05-25 MED ORDER — PHENYLEPHRINE HCL 10 MG/ML IJ SOLN
INTRAMUSCULAR | Status: DC | PRN
  Administered 2017-05-25 (×2): 80 ug via INTRAVENOUS
  Administered 2017-05-25: 40 ug via INTRAVENOUS
  Administered 2017-05-25: 80 ug via INTRAVENOUS
  Administered 2017-05-25 (×2): 60 ug via INTRAVENOUS

## 2017-05-25 MED ORDER — FENTANYL CITRATE (PF) 100 MCG/2ML IJ SOLN
50.0000 ug | INTRAMUSCULAR | Status: AC | PRN
  Administered 2017-05-25: 25 ug via INTRAVENOUS
  Administered 2017-05-25: 50 ug via INTRAVENOUS
  Administered 2017-05-25: 25 ug via INTRAVENOUS
  Administered 2017-05-25 (×2): 50 ug via INTRAVENOUS

## 2017-05-25 MED ORDER — PHENYLEPHRINE 40 MCG/ML (10ML) SYRINGE FOR IV PUSH (FOR BLOOD PRESSURE SUPPORT)
PREFILLED_SYRINGE | INTRAVENOUS | Status: AC
  Filled 2017-05-25: qty 10

## 2017-05-25 MED ORDER — OXYCODONE HCL 5 MG PO TABS: 5.0000 mg | ORAL_TABLET | Freq: Four times a day (QID) | ORAL | 0 refills | Status: DC | PRN

## 2017-05-25 MED ORDER — VANCOMYCIN HCL IN DEXTROSE 1-5 GM/200ML-% IV SOLN
INTRAVENOUS | Status: AC
  Filled 2017-05-25: qty 200

## 2017-05-25 MED ORDER — CIPROFLOXACIN IN D5W 400 MG/200ML IV SOLN
INTRAVENOUS | Status: AC
  Filled 2017-05-25: qty 200

## 2017-05-25 MED ORDER — SCOPOLAMINE 1 MG/3DAYS TD PT72: 1.0000 | MEDICATED_PATCH | Freq: Once | TRANSDERMAL | Status: DC

## 2017-05-25 MED ORDER — MIDAZOLAM HCL 2 MG/2ML IJ SOLN
1.0000 mg | INTRAMUSCULAR | Status: DC | PRN
  Administered 2017-05-25: 1 mg via INTRAVENOUS
  Administered 2017-05-25 (×2): 2 mg via INTRAVENOUS

## 2017-05-25 MED ORDER — ACETAMINOPHEN 500 MG PO TABS
ORAL_TABLET | ORAL | Status: AC
  Filled 2017-05-25: qty 2

## 2017-05-25 MED ORDER — PROPOFOL 10 MG/ML IV BOLUS
INTRAVENOUS | Status: DC | PRN
  Administered 2017-05-25: 160 mg via INTRAVENOUS

## 2017-05-25 MED ORDER — PROPOFOL 10 MG/ML IV BOLUS
INTRAVENOUS | Status: AC
  Filled 2017-05-25: qty 20

## 2017-05-25 MED ORDER — EPHEDRINE SULFATE 50 MG/ML IJ SOLN
INTRAMUSCULAR | Status: DC | PRN
  Administered 2017-05-25 (×6): 10 mg via INTRAVENOUS

## 2017-05-25 MED ORDER — FENTANYL CITRATE (PF) 100 MCG/2ML IJ SOLN: 25.0000 ug | INTRAMUSCULAR | Status: DC | PRN

## 2017-05-25 MED ORDER — LIDOCAINE 2% (20 MG/ML) 5 ML SYRINGE
INTRAMUSCULAR | Status: AC
  Filled 2017-05-25: qty 5

## 2017-05-25 MED ORDER — CELECOXIB 400 MG PO CAPS
400.0000 mg | ORAL_CAPSULE | ORAL | Status: AC
  Administered 2017-05-25: 400 mg via ORAL

## 2017-05-25 MED ORDER — CIPROFLOXACIN IN D5W 400 MG/200ML IV SOLN
400.0000 mg | INTRAVENOUS | Status: AC
  Administered 2017-05-25: 400 mg via INTRAVENOUS

## 2017-05-25 SURGICAL SUPPLY — 49 items
BAG DECANTER FOR FLEXI CONT (MISCELLANEOUS) ×3 IMPLANT
BINDER BREAST LRG (GAUZE/BANDAGES/DRESSINGS) IMPLANT
BINDER BREAST XLRG (GAUZE/BANDAGES/DRESSINGS) ×3 IMPLANT
BLADE SURG 15 STRL LF DISP TIS (BLADE) ×2 IMPLANT
BLADE SURG 15 STRL SS (BLADE) ×1
CANISTER SUC SOCK COL 7 IN (MISCELLANEOUS) ×3 IMPLANT
CANISTER SUCT 1200ML W/VALVE (MISCELLANEOUS) ×3 IMPLANT
CHLORAPREP W/TINT 26ML (MISCELLANEOUS) ×3 IMPLANT
CLIP VESOCCLUDE SM WIDE 6/CT (CLIP) ×3 IMPLANT
COVER BACK TABLE 60X90IN (DRAPES) ×3 IMPLANT
COVER MAYO STAND STRL (DRAPES) ×3 IMPLANT
COVER PROBE W GEL 5X96 (DRAPES) IMPLANT
DERMABOND ADVANCED (GAUZE/BANDAGES/DRESSINGS) ×2
DERMABOND ADVANCED .7 DNX12 (GAUZE/BANDAGES/DRESSINGS) ×4 IMPLANT
DEVICE DUBIN W/COMP PLATE 8390 (MISCELLANEOUS) ×3 IMPLANT
DRAPE C-ARM 42X72 X-RAY (DRAPES) ×3 IMPLANT
DRAPE LAPAROSCOPIC ABDOMINAL (DRAPES) ×3 IMPLANT
DRAPE UTILITY XL STRL (DRAPES) ×3 IMPLANT
ELECT COATED BLADE 2.86 ST (ELECTRODE) ×3 IMPLANT
ELECT REM PT RETURN 9FT ADLT (ELECTROSURGICAL) ×3
ELECTRODE REM PT RTRN 9FT ADLT (ELECTROSURGICAL) ×2 IMPLANT
GLOVE BIO SURGEON STRL SZ 6.5 (GLOVE) ×3 IMPLANT
GLOVE BIOGEL PI IND STRL 8 (GLOVE) ×4 IMPLANT
GLOVE BIOGEL PI INDICATOR 8 (GLOVE) ×2
GLOVE ECLIPSE 7.5 STRL STRAW (GLOVE) ×9 IMPLANT
GLOVE SURG SS PI 6.5 STRL IVOR (GLOVE) ×3 IMPLANT
GOWN STRL REUS W/ TWL LRG LVL3 (GOWN DISPOSABLE) ×2 IMPLANT
GOWN STRL REUS W/ TWL XL LVL3 (GOWN DISPOSABLE) ×2 IMPLANT
GOWN STRL REUS W/TWL LRG LVL3 (GOWN DISPOSABLE) ×1
GOWN STRL REUS W/TWL XL LVL3 (GOWN DISPOSABLE) ×1
ILLUMINATOR WAVEGUIDE N/F (MISCELLANEOUS) ×3 IMPLANT
KIT MARKER MARGIN INK (KITS) ×3 IMPLANT
KIT PORT POWER 8FR ISP CVUE (Miscellaneous) ×3 IMPLANT
NEEDLE HYPO 25X1 1.5 SAFETY (NEEDLE) ×3 IMPLANT
PACK BASIN DAY SURGERY FS (CUSTOM PROCEDURE TRAY) ×3 IMPLANT
PENCIL BUTTON HOLSTER BLD 10FT (ELECTRODE) ×3 IMPLANT
PULLUP PROBE COVER KIT ×3 IMPLANT
SLEEVE SCD COMPRESS KNEE MED (MISCELLANEOUS) ×3 IMPLANT
SPONGE LAP 4X18 X RAY DECT (DISPOSABLE) ×3 IMPLANT
SUT MON AB 4-0 PC3 18 (SUTURE) ×3 IMPLANT
SUT MON AB 5-0 PS2 18 (SUTURE) IMPLANT
SUT PROLENE 2 0 CT2 30 (SUTURE) ×3 IMPLANT
SUT SILK 4 0 TIES 17X18 (SUTURE) IMPLANT
SUT VICRYL 3-0 CR8 SH (SUTURE) ×3 IMPLANT
SYR 5ML LUER SLIP (SYRINGE) ×3 IMPLANT
SYR CONTROL 10ML LL (SYRINGE) ×3 IMPLANT
TOWEL OR 17X24 6PK STRL BLUE (TOWEL DISPOSABLE) ×6 IMPLANT
TUBE CONNECTING 20X1/4 (TUBING) ×3 IMPLANT
YANKAUER SUCT BULB TIP NO VENT (SUCTIONS) ×3 IMPLANT

## 2017-05-25 NOTE — Discharge Instructions (Signed)
[facility] °Office Phone Number [phone] ° °BREAST BIOPSY/ PARTIAL MASTECTOMY: POST OP INSTRUCTIONS ° °Always review your discharge instruction sheet given to you by the facility where your surgery was performed. ° °IF YOU HAVE DISABILITY OR FAMILY LEAVE FORMS, YOU MUST BRING THEM TO THE OFFICE FOR PROCESSING.  DO NOT GIVE THEM TO YOUR DOCTOR. ° °1. A prescription for pain medication may be given to you upon discharge.  Take your pain medication as prescribed, if needed.  If narcotic pain medicine is not needed, then you may take acetaminophen (Tylenol) or ibuprofen (Advil) as needed. °2. Take your usually prescribed medications unless otherwise directed °3. If you need a refill on your pain medication, please contact your pharmacy.  They will contact our office to request authorization.  Prescriptions will not be filled after 5pm or on week-ends. °4. You should eat very light the first 24 hours after surgery, such as soup, crackers, pudding, etc.  Resume your normal diet the day after surgery. °5. Most patients will experience some swelling and bruising in the breast.  Ice packs and a good support bra will help.  Swelling and bruising can take several days to resolve.  °6. It is common to experience some constipation if taking pain medication after surgery.  Increasing fluid intake and taking a stool softener will usually help or prevent this problem from occurring.  A mild laxative (Milk of Magnesia or Miralax) should be taken according to package directions if there are no bowel movements after 48 hours. °7. Unless discharge instructions indicate otherwise, you may remove your bandages 24-48 hours after surgery, and you may shower at that time.  You may have steri-strips (small skin tapes) in place directly over the incision.  These strips should be left on the skin for 7-10 days.  If your surgeon used skin glue on the incision, you may shower in 24 hours.  The glue will flake off over the  next 2-3 weeks.  Any sutures or staples will be removed at the office during your follow-up visit. °8. ACTIVITIES:  You may resume regular daily activities (gradually increasing) beginning the next day.  Wearing a good support bra or sports bra minimizes pain and swelling.  You may have sexual intercourse when it is comfortable. °a. You may drive when you no longer are taking prescription pain medication, you can comfortably wear a seatbelt, and you can safely maneuver your car and apply brakes. °b. RETURN TO WORK:  ______________________________________________________________________________________ °9. You should see your doctor in the office for a follow-up appointment approximately two weeks after your surgery.  Your doctor’s nurse will typically make your follow-up appointment when she calls you with your pathology report.  Expect your pathology report 2-3 business days after your surgery.  You may call to check if you do not hear from us after three days. °10. OTHER INSTRUCTIONS: _______________________________________________________________________________________________ _____________________________________________________________________________________________________________________________________ °_____________________________________________________________________________________________________________________________________ °_____________________________________________________________________________________________________________________________________ ° °WHEN TO CALL YOUR DOCTOR: °1. Fever over 101.0 °2. Nausea and/or vomiting. °3. Extreme swelling or bruising. °4. Continued bleeding from incision. °5. Increased pain, redness, or drainage from the incision. ° °The clinic staff is available to answer your questions during regular business hours.  Please don’t hesitate to call and ask to speak to one of the nurses for clinical concerns.  If you have a medical emergency, go to the nearest  emergency room or call 911.  A surgeon from [facility] is always on call at the hospital. ° °For further questions, please visit [email or web address]  ° ° ° °  PORT-A-CATH: POST OP INSTRUCTIONS  Always review your discharge instruction sheet given to you by the facility where your surgery was performed.   1. A prescription for pain medication may be given to you upon discharge. Take your pain medication as prescribed, if needed. If narcotic pain medicine is not needed, then you make take acetaminophen (Tylenol) or ibuprofen (Advil) as needed.  2. Take your usually prescribed medications unless otherwise directed. 3. If you need a refill on your pain medication, please contact our office. All narcotic pain medicine now requires a paper prescription.  Phoned in and fax refills are no longer allowed by law.  Prescriptions will not be filled after 5 pm or on weekends.  4. You should follow a light diet for the remainder of the day after your procedure. 5. Most patients will experience some mild swelling and/or bruising in the area of the incision. It may take several days to resolve. 6. It is common to experience some constipation if taking pain medication after surgery. Increasing fluid intake and taking a stool softener (such as Colace) will usually help or prevent this problem from occurring. A mild laxative (Milk of Magnesia or Miralax) should be taken according to package directions if there are no bowel movements after 48 hours.  7. Unless discharge instructions indicate otherwise, you may remove your bandages 48 hours after surgery, and you may shower at that time. You may have steri-strips (small white skin tapes) in place directly over the incision.  These strips should be left on the skin for 7-10 days.  If your surgeon used Dermabond (skin glue) on the incision, you may shower in 24 hours.  The glue will flake off over the next 2-3 weeks.  8. If your port is left accessed at  the end of surgery (needle left in port), the dressing cannot get wet and should only by changed by a healthcare professional. When the port is no longer accessed (when the needle has been removed), follow step 7.   9. ACTIVITIES:  Limit activity involving your arms for the next 72 hours. Do no strenuous exercise or activity for 1 week. You may drive when you are no longer taking prescription pain medication, you can comfortably wear a seatbelt, and you can maneuver your car. 10.You may need to see your doctor in the office for a follow-up appointment.  Please       check with your doctor.  11.When you receive a new Port-a-Cath, you will get a product guide and        ID card.  Please keep them in case you need them.  WHEN TO CALL YOUR DOCTOR ([phone]): 1. Fever over 101.0 2. Chills 3. Continued bleeding from incision 4. Increased redness and tenderness at the site 5. Shortness of breath, difficulty breathing   The clinic staff is available to answer your questions during regular business hours. Please don't hesitate to call and ask to speak to one of the nurses or medical assistants for clinical concerns. If you have a medical emergency, go to the nearest emergency room or call 911.  A surgeon from [facility] is always on call at the hospital.     For further information, please visit [email or web address]   Post Anesthesia Home Care Instructions  Activity: Get plenty of rest for the remainder of the day. A responsible individual must stay with you for 24 hours following the procedure.  For the next 24 hours, DO NOT: -Drive a car -Operate machinery -  Drink alcoholic beverages -Take any medication unless instructed by your physician -Make any legal decisions or sign important papers.  Meals: Start with liquid foods such as gelatin or soup. Progress to regular foods as tolerated. Avoid greasy, spicy, heavy foods. If nausea and/or vomiting occur, drink only  clear liquids until the nausea and/or vomiting subsides. Call your physician if vomiting continues.  Special Instructions/Symptoms: Your throat may feel dry or sore from the anesthesia or the breathing tube placed in your throat during surgery. If this causes discomfort, gargle with warm salt water. The discomfort should disappear within 24 hours.  If you had a scopolamine patch placed behind your ear for the management of post- operative nausea and/or vomiting:  1. The medication in the patch is effective for 72 hours, after which it should be removed.  Wrap patch in a tissue and discard in the trash. Wash hands thoroughly with soap and water. 2. You may remove the patch earlier than 72 hours if you experience unpleasant side effects which may include dry mouth, dizziness or visual disturbances. 3. Avoid touching the patch. Wash your hands with soap and water after contact with the patch.        

## 2017-05-25 NOTE — Anesthesia Preprocedure Evaluation (Signed)
Anesthesia Evaluation  Patient identified by MRN, date of birth, ID band Patient awake    Reviewed: Allergy & Precautions, H&P , NPO status , Patient's Chart, lab work & pertinent test results  Airway Mallampati: II   Neck ROM: full    Dental   Pulmonary shortness of breath,    breath sounds clear to auscultation       Cardiovascular hypertension,  Rhythm:regular Rate:Normal     Neuro/Psych    GI/Hepatic hiatal hernia, GERD  ,  Endo/Other  obese  Renal/GU      Musculoskeletal  (+) Arthritis ,   Abdominal   Peds  Hematology   Anesthesia Other Findings   Reproductive/Obstetrics Breast CA                             Anesthesia Physical Anesthesia Plan  ASA: II  Anesthesia Plan: General   Post-op Pain Management:  Regional for Post-op pain   Induction: Intravenous  PONV Risk Score and Plan: 3 and Ondansetron, Dexamethasone, Midazolam and Treatment may vary due to age or medical condition  Airway Management Planned: LMA  Additional Equipment:   Intra-op Plan:   Post-operative Plan:   Informed Consent: I have reviewed the patients History and Physical, chart, labs and discussed the procedure including the risks, benefits and alternatives for the proposed anesthesia with the patient or authorized representative who has indicated his/her understanding and acceptance.     Plan Discussed with: CRNA, Anesthesiologist and Surgeon  Anesthesia Plan Comments:         Anesthesia Quick Evaluation

## 2017-05-25 NOTE — Progress Notes (Signed)
  Assisted Dr. Hodierne with right, ultrasound guided, pectoralis block. Side rails up, monitors on throughout procedure. See vital signs in flow sheet. Tolerated Procedure well. 

## 2017-05-25 NOTE — Op Note (Addendum)
Preoperative Diagnosis: RIGHT BREAST CANCER  Postoprative Diagnosis: RIGHT BREAST CANCER  Procedure: Procedure(s): RIGHT BREAST LUMPECTOMY WITH RADIOACTIVE SEED AND  DEEP RIGHT AXILLARY SENTINEL LYMPH NODE BIOPSY INSERTION PORT-A-CATH   Surgeon: Excell Seltzer T   Assistants: None  Anesthesia:  General LMA anesthesia  Indications: 66 year old female with a new diagnosis of cancer of the right breast, upper inner quadrant. Clinical stage 1B, ER ositive, PR egative, HER-2 positive. Tumor size 1.2 cm by ultrasound. After extensive preoperative workup and discussion regarding procedure and indications and risks detailed elsewhere we have elected to proceed with radioactive seed localized right breast lumpectomy, axillary sentinel lymph node biopsy and placement of Port-A-Cath is initial surgical treatment for her breast cancer.  Procedure Detail:  Patient in the holding area underwent injection of 1 mCi of technetium sulfur colloid intradermally around the right nipple. She received preoperative IV antibiotics. She developed some itching and redness to her arm on Cipro infusion which was stopped and she received 1 g of vancomycin. She was brought to the operating room, placed in the supine position on the operating table and laryngeal mask general anesthesia induced. She is carefully positioned with her left arm tucked and right arm extended. The entire anterior chest, neck, axilla and upper arms were widely sterilely prepped and draped. Patient timeout was performed and correct procedure verified. The lumpectomy was approached initially. The seed and tumor were localized in the fairly extreme upper inner quadrant of the right breast. I used a circumareolar incision in the inner and upper part of the areola. A skin subcutaneous flap was then developed superiorly over the area of the seed localized with the neoprobe. There was also a small palpable mass. Using the neoprobe and palpable abnormality as  guidance a fairly generous globular specimen of breast tissue was excised around the area of high counts. I removed approximately a 4 cm specimen. There was some firm tissue down against the chest wall which was definitely deeper than the seed and I suspect this was postbiopsy reaction. The specimen was removed and inked for margins. Specimen x-ray showed the seed and marking clip contained within the specimen although slightly toward the superior end of the specimen. I did excise further superior and medial margins for several millimeters which were inked and sent as separate specimens. I also completely cleared the posterior aspect of the lumpectomy site down to the chest wall and this tissue was sent as an unoriented specimen. Complete hemostasis was assured. The lumpectomy cavity was marked with clips. Breast tissue was advanced into the defect superiorly from underneath the skin and subcutaneous flap without created and sutured in place with interrupted 3-0 Vicryl. Subcutaneous was closed with interrupted 3-0 Vicryl. Attention was turned to the sentinel lymph node. A definite hot area in the right axilla was localized and a transverse incision made and dissection carried down to subcutaneous tissue and clavipectoral fascia was identified and incised. Using the neoprobe for guidance I initially dissected down onto a slightly firm normal-sized lymph node that had moderately elevated counts for about 200. This was completely excised. A somewhat more inferior deep axillary lymph node was then localized dissected free and completely excised with cautery. Ex vivo this node had counts of about 3000. There was still some moderate counts in the axilla more superiorly and a third deep axillary lymph node was dissected against the chest wall identified and removed with cautery and ex vivo had counts of about 600. At this point background in the  axilla was less than 100 and no palpable abnormalities. Hemostasis was assured  at the operative site and the deep axillary tissue closed with interrupted 3-0 Vicryl, subcutaneous closed with interrupted 3-0 Vicryl and both breast and axillary incisions were closed with subcuticular 5-0 Monocryl and Dermabond. Gloves, gown and instruments were changed. Attention was turned to the Port-A-Cath. The ultrasound was used to clearly identify the left internal jugular vein compressible just anterior and lateral to the carotid artery. Skin overlying this was infiltrated with local anesthesia. Under direct ultrasound guidance the introducer needle was advanced into the internal jugular vein and the guidewire passed without difficulty. C-arm confirmed guidewire in proper position. A small incision was made at the guidewire insertion site and the introducer passed over the guidewire which was removed and the flush catheter was introduced via the introducer which was stripped away. C-arm initially showed the catheter coiled in the innominate vein but it was able to be packed out and re-advanced to its tip in the SVC. Following this a site for the port on the anterior left chest wall was chosen and anesthetized. A small transverse incision made and subcutaneous pocket created. The catheter was tunneled subcutaneously to the pocket, trimmed to length and attached to the port which was sutured to the anterior chest wall with interrupted Prolene. C-arm was again used to confirm good position of the catheter. Skin incisions were closed with subcuticular 4-0 Monocryl and Dermabond. The port aspirated and flushed easily and was left flushed with concentrated heparin solution.     Findings: As above  Estimated Blood Loss:  less than 50 mL         Drains: None  Blood Given: none          Specimens: #1 right breast lumpectomy   #2 additional superior margin oriented   #3 additional medial margin oriented   #4 pectoral fascia   #5 right axillary sentinel lymph nodes 3        Complications:  * No  complications entered in OR log *         Disposition: PACU - hemodynamically stable.         Condition: stable

## 2017-05-25 NOTE — Anesthesia Procedure Notes (Signed)
Anesthesia Regional Block: Pectoralis block   Pre-Anesthetic Checklist: ,, timeout performed, Correct Patient, Correct Site, Correct Laterality, Correct Procedure, Correct Position, site marked, Risks and benefits discussed,  Surgical consent,  Pre-op evaluation,  At surgeon's request and post-op pain management  Laterality: Right  Prep: chloraprep       Needles:  Injection technique: Single-shot  Needle Type: Echogenic Needle     Needle Length: 9cm  Needle Gauge: 21     Additional Needles:   Procedures: ultrasound guided,,,,,,,,  Narrative:  Start time: 05/25/2017 9:38 AM End time: 05/25/2017 9:43 AM Injection made incrementally with aspirations every 5 mL.  Performed by: Personally  Anesthesiologist: Rodd Heft  Additional Notes: Pt tolerated the procedure well.

## 2017-05-25 NOTE — Anesthesia Procedure Notes (Signed)
Procedure Name: LMA Insertion Date/Time: 05/25/2017 10:24 AM Performed by: Bufford Spikes Pre-anesthesia Checklist: Patient identified, Emergency Drugs available, Suction available and Patient being monitored Patient Re-evaluated:Patient Re-evaluated prior to induction Oxygen Delivery Method: Circle system utilized Preoxygenation: Pre-oxygenation with 100% oxygen Induction Type: IV induction Ventilation: Mask ventilation without difficulty LMA: LMA inserted LMA Size: 3.0 Tube type: Oral Number of attempts: 1 Placement Confirmation: positive ETCO2 Tube secured with: Tape Dental Injury: Teeth and Oropharynx as per pre-operative assessment

## 2017-05-25 NOTE — Progress Notes (Signed)
Patient had ciprfloxacin IV medication started at 0947hrs and within a few minutes patient had a rash and hives/welts (four) to left forearm, and arm was extremely itchy. The IV was infusing in the left arm.  Cipro was stopped immediately and Dr. Excell Seltzer in to see patient.Welts were marked  with pink pen. Vancomycin was ordered instead and hung. No reaction noted with vancomycin at this time. Ciprfloxacin was added to patients allergy profile.

## 2017-05-25 NOTE — Interval H&P Note (Signed)
History and Physical Interval Note:  05/25/2017 10:00 AM  [name]  has presented today for surgery, with the diagnosis of RIGHT BREAST CANCER  The various methods of treatment have been discussed with the patient and family. After consideration of risks, benefits and other options for treatment, the patient has consented to  Procedure(s): RIGHT BREAST LUMPECTOMY WITH RADIOACTIVE SEED AND  RIGHT AXILLARY SENTINEL LYMPH NODE BIOPSY (Right) INSERTION PORT-A-CATH (N/A) as a surgical intervention .  The patient's history has been reviewed, patient examined, no change in status, stable for surgery.  I have reviewed the patient's chart and labs.  Questions were answered to the patient's satisfaction.     Dawn [name]

## 2017-05-25 NOTE — Transfer of Care (Signed)
Immediate Anesthesia Transfer of Care Note  Patient: Dawn [name]  Procedure(s) Performed: Procedure(s): RIGHT BREAST LUMPECTOMY WITH RADIOACTIVE SEED AND  RIGHT AXILLARY SENTINEL LYMPH NODE BIOPSY (Right) INSERTION PORT-A-CATH (Left)  Patient Location: PACU  Anesthesia Type:General  Level of Consciousness: awake and sedated  Airway & Oxygen Therapy: Patient Spontanous Breathing and Patient connected to face mask oxygen  Post-op Assessment: Report given to RN and Post -op Vital signs reviewed and stable  Post vital signs: Reviewed and stable  Last Vitals:  Vitals:   05/25/17 1010 05/25/17 1015  BP: 113/76   Pulse: 69 68  Resp: 17 14  Temp:    SpO2: 100% 97%    Last Pain:  Vitals:   05/25/17 0940  TempSrc:   PainSc: 0-No pain      Patients Stated Pain Goal: 0 (52/[id number])  Complications: No apparent anesthesia complications

## 2017-05-25 NOTE — H&P (Signed)
History of Present Illness Marland Kitchen T. Krisa Blattner MD; 05/06/2017 12:28 PM) The patient is a 66 year old female who presents with breast cancer. She is a post menopausal female referred by Dr. Johnnette Gourd for evaluation of recently diagnosed carcinoma of the right breast. She recently presented for a screening mamogram revealing a possible mass in the right upper inner quadrant. Subsequent imaging included diagnostic mamogram showing a 1.2 cm irregular spiculated mass upper inner quadrant middle depth 11 cm from the nipple and ultrasound showing a 1.2 cm mass in the upper inner quadrant 11 cm from the nipple. An ultrasound guided breast biopsy was performed on 04/27/2017 with pathology revealing invasive ductal carcinoma of the breast. She is seen now in breast multidisciplinary clinic for initial treatment planning. She has experienced no breast symptoms, specifically lump or pain nipple discharge or crusting or skin changes. She does not have a personal history of any previous breast problems.  unfortunately the patient also has had recent onset of vaginal bleeding and just this week had a biopsy diagnosing an endometrial carcinoma. She has not yet seen Dr. Denman George who will be managing her endometrial cancer.  Findings at that time were the following: Tumor size: 1.2 cm Tumor grade: 2, Ki-67 30% Estrogen Receptor: positive Progesterone Receptor: negative Her-2 neu: positive Lymph node status: negative    Past Surgical History Tawni Pummel, RN; 05/06/2017 7:33 AM) Breast Biopsy  Right. Colon Polyp Removal - Colonoscopy   Diagnostic Studies History Tawni Pummel, RN; 05/06/2017 7:33 AM) Colonoscopy  1-5 years ago Mammogram  within last year Pap Smear  1-5 years ago  Medication History Tawni Pummel, RN; 05/06/2017 7:33 AM) Medications Reconciled  Social History Tawni Pummel, RN; 05/06/2017 7:33 AM) Alcohol use  Occasional alcohol use. Caffeine use  Tea. No drug use   Tobacco use  Never smoker.  Family History Tawni Pummel, RN; 05/06/2017 7:33 AM) Alcohol Abuse  Family Members In General. Arthritis  Family Members In General. Breast Cancer  Family Members In General. Cancer  Family Members In General, Father. Cerebrovascular Accident  Mother. Cervical Cancer  Family Members In [place]  Father. Depression  Mother. Diabetes Mellitus  Family Members In General. Heart Disease  Father. Hypertension  Father. Ischemic Bowel Disease  Father. Kidney Disease  Family Members In General. Prostate Cancer  Father.  Pregnancy / Birth History Tawni Pummel, RN; 05/06/2017 7:33 AM) Age at menarche  85 years. Age of menopause  51-55 Contraceptive History  Intrauterine device, Oral contraceptives. Gravida  2 Irregular periods  Length (months) of breastfeeding  3-6 Maternal age  30-30 Para  2  Other Problems Tawni Pummel, RN; 05/06/2017 7:33 AM) Anxiety Disorder  Arthritis  Back Pain  Bladder Problems  Breast Cancer  Diverticulosis  Gastroesophageal Reflux Disease  Heart murmur  High blood pressure  Hypercholesterolemia  Lump In Breast  Melanoma  Ventral Hernia Repair     Review of Systems Sunday Spillers Ledford RN; 05/06/2017 7:33 AM) General Present- Fatigue and Night Sweats. Not Present- Appetite Loss, Chills, Fever, Weight Gain and Weight Loss. Skin Not Present- Change in Wart/Mole, Dryness, Hives, Jaundice, New Lesions, Non-Healing Wounds, Rash and Ulcer. HEENT Present- Hearing Loss, Ringing in the Ears, Sore Throat, Visual Disturbances and Wears glasses/contact lenses. Not Present- Earache, Hoarseness, Nose Bleed, Oral Ulcers, Seasonal Allergies, Sinus Pain and Yellow Eyes. Respiratory Present- Chronic Cough. Not Present- Bloody sputum, Difficulty Breathing, Snoring and Wheezing. Breast Not Present- Breast Mass, Breast Pain, Nipple Discharge and Skin Changes. Cardiovascular Present- Leg Cramps.  Not Present- Chest Pain, Difficulty Breathing Lying Down, Palpitations, Rapid Heart Rate, Shortness of Breath and Swelling of Extremities. Gastrointestinal Present- Bloating. Not Present- Abdominal Pain, Bloody Stool, Change in Bowel Habits, Chronic diarrhea, Constipation, Difficulty Swallowing, Excessive gas, Gets full quickly at meals, Hemorrhoids, Indigestion, Nausea, Rectal Pain and Vomiting. Female Genitourinary Not Present- Frequency, Nocturia, Painful Urination, Pelvic Pain and Urgency. Musculoskeletal Not Present- Back Pain, Joint Pain, Joint Stiffness, Muscle Pain, Muscle Weakness and Swelling of Extremities. Neurological Present- Numbness. Not Present- Decreased Memory, Fainting, Headaches, Seizures, Tingling, Tremor, Trouble walking and Weakness. Psychiatric Present- Anxiety. Not Present- Bipolar, Change in Sleep Pattern, Depression, Fearful and Frequent crying. Endocrine Present- Hot flashes. Not Present- Cold Intolerance, Excessive Hunger, Hair Changes, Heat Intolerance and New Diabetes.   Physical Exam Marland Kitchen T. Irys Nigh MD; 05/06/2017 12:29 PM) The physical exam findings are as follows: Note:General: Alert, ildly overweight Caucasian female, in no distress Skin: Warm and dry without rash or infection. HEENT: No palpable masses or thyromegaly. Sclera nonicteric. Pupils equal round and reactive. Lymph nodes: No cervical, supraclavicular, nodes palpable. Breasts: I cannot feel any masses in either breast. No palpable axillary adenopathy. No nipple inversion or crusting or skin changes. Lungs: Breath sounds clear and equal. No wheezing or increased work of breathing. Cardiovascular: Regular rate and rhythm without murmer. No JVD or edema. Abdomen: Nondistended. Soft and nontender. No masses palpable. No organomegaly. No palpable hernias. Extremities: No edema or joint swelling or deformity. No chronic venous stasis changes. Neurologic: Alert and fully oriented. Gait normal. No focal  weakness. Psychiatric: Normal mood and affect. Thought content appropriate with normal judgement and insight    Assessment & Plan Marland Kitchen T. Marlisa Caridi MD; 05/06/2017 12:36 PM) MALIGNANT NEOPLASM OF RIGHT BREAST, STAGE 1, ESTROGEN RECEPTOR POSITIVE (C50.911) Impression: 66 year old female with a new diagnosis of cancer of the right breast, upper inner quadrant. Clinical stage 1B, ER ositive, PR egative, HER-2 positive. I discussed with the patient and er husband present today initial surgical treatment options. We discussed options of breast conservation with lumpectomy or total mastectomy and sentinal lymph node biopsy/dissection. Options for reconstruction were discussed. she would appear to be a very good candidate for breast conservation. She will require genetic testing due to her endometrial cancer and history of colon cancer in her family. This likely would influence her decision regarding breast conservation. After discussion they have elected to proceed with breast conservation with radioactivlymph node biopsy,umpectomy and axillary sentinel lymph node biopsy, pending results of genetic testing. [name] has recommended chemotherapy and we will plan Port-A-Cath placement at the time of her breast surgery and we discussed this.We discussed the indications and nature of the procedure, and expected recovery, in detail. Surgical risks including anesthetic complications, cardiorespiratory complications, bleeding, infection, wound healing complications, blood clots, lymphedema, local and distant recurrence and possible need for further surgery based on the final pathology was discussed and understood. Chemotherapy, hormonal therapy and radiation therapy have been discussed. They have been provided with literature regarding the treatment of breast cancer. All questions were answered.  Current Plans Referred to Genetic Counseling, for evaluation and follow up (Medical Genetics). Routine. radioactive seed  localized right breast lumpectomy axillary and right axillary sentinel lymph node biopsy, placement of Port-A-Cath, pending genetic testing

## 2017-05-26 NOTE — Anesthesia Postprocedure Evaluation (Signed)
Anesthesia Post Note  Patient: JENINA MOENING  Procedure(s) Performed: Procedure(s) (LRB): RIGHT BREAST LUMPECTOMY WITH RADIOACTIVE SEED AND  RIGHT AXILLARY SENTINEL LYMPH NODE BIOPSY (Right) INSERTION PORT-A-CATH (Left)     Patient location during evaluation: PACU Anesthesia Type: General Level of consciousness: awake and alert Pain management: pain level controlled Vital Signs Assessment: post-procedure vital signs reviewed and stable Respiratory status: spontaneous breathing, nonlabored ventilation, respiratory function stable and patient connected to nasal cannula oxygen Cardiovascular status: blood pressure returned to baseline and stable Postop Assessment: no signs of nausea or vomiting Anesthetic complications: no    Last Vitals:  Vitals:   05/25/17 1330 05/25/17 1400  BP: 137/75 135/80  Pulse: 92 87  Resp: 17 18  Temp:  36.5 C  SpO2: 94% 95%    Last Pain:  Vitals:   05/26/17 0908  TempSrc:   PainSc: [place]

## 2017-05-27 ENCOUNTER — Telehealth: Payer: Self-pay | Admitting: Genetics

## 2017-05-27 NOTE — Telephone Encounter (Signed)
Revealed negative genetic testing.  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 Discussed that we do not know why she has breast and uterine cancer or why there is cancer in the family. It could be due to a different gene that we are not testing, or maybe our current technology may not be able to pick something up.  It will be important for her to keep in contact with genetics to keep up with whether additional testing may be needed.  Recommended she continue to get colonoscopies every 5 years or as directed by her physicians.  (recomended colonoscopies for her siblings every 5 years as well.)

## 2017-05-28 ENCOUNTER — Ambulatory Visit: Payer: 59 | Admitting: Family Medicine

## 2017-05-29 ENCOUNTER — Ambulatory Visit: Payer: Self-pay | Admitting: Genetics

## 2017-05-29 ENCOUNTER — Encounter: Payer: Self-pay | Admitting: Genetics

## 2017-05-29 ENCOUNTER — Encounter (HOSPITAL_BASED_OUTPATIENT_CLINIC_OR_DEPARTMENT_OTHER): Payer: Self-pay | Admitting: General Surgery

## 2017-05-29 DIAGNOSIS — Z800 Family history of malignant neoplasm of digestive organs: Secondary | ICD-10-CM

## 2017-05-29 DIAGNOSIS — Z170 Estrogen receptor positive status [ER+]: Secondary | ICD-10-CM

## 2017-05-29 DIAGNOSIS — Z1379 Encounter for other screening for genetic and chromosomal anomalies: Secondary | ICD-10-CM

## 2017-05-29 DIAGNOSIS — C541 Malignant neoplasm of endometrium: Secondary | ICD-10-CM

## 2017-05-29 DIAGNOSIS — Z8052 Family history of malignant neoplasm of bladder: Secondary | ICD-10-CM

## 2017-05-29 DIAGNOSIS — C50211 Malignant neoplasm of upper-inner quadrant of right female breast: Secondary | ICD-10-CM

## 2017-05-29 NOTE — Progress Notes (Signed)
HPI: Ms. Hodgkins was previously seen in the [facility] on 05/07/2017 due to a personal history of breast and uterine cancer, a family history of colon and gyn cancer, and concerns regarding a hereditary predisposition to cancer. Please refer to our prior cancer genetics clinic note for more information regarding Ms. Ertle medical, social and family histories, and our assessment and recommendations, at the time. Ms. Owusu recent genetic test results were disclosed to her, as well as recommendations warranted by these results. These results and recommendations are discussed in more detail below.  CANCER HISTORY:    Malignant neoplasm of upper-inner quadrant of right breast in female, estrogen receptor positive ([place])   04/27/2017 Initial Biopsy    Diagnosis Breast, right, needle core biopsy INVASIVE DUCTAL CARCINOMA, GRADE 2 Microscopic Comment The neoplasm has intracellular mucin and signet ring cell features. Immunostains shows these cells are positive for ER,GATA3, ck7 and GCDFP, negative for ck20, cdx2, TTF-1 and pax8, The immunostaining pattern supports the neoplasm is breast primary.      04/27/2017 Receptors her2    Estrogen Receptor: 70%, POSITIVE, MODERATE STAINING INTENSITY Progesterone Receptor: 0%, NEGATIVE Proliferation Marker Ki67: 30% HER2 - **POSITIVE** RATIO OF HER2/CEP17 SIGNALS 2.91 AVERAGE HER2 COPY NUMBER PER CELL 7.28      04/27/2017 Initial Diagnosis    Malignant neoplasm of upper-inner quadrant of right breast in female, estrogen receptor positive ([place])       Mammogram         05/15/2017 Genetic Testing    Negative genetic testing on the 9-gene STAT panel.  The STAT Breast cancer panel offered by [facility] includes sequencing and rearrangement analysis for the following 9 genes:  ATM, BRCA1, BRCA2, CDH1, CHEK2, PALB2, PTEN, STK11 and TP53.   The report date is May 15, 2017.         FAMILY HISTORY:  We obtained a detailed,  4-generation family history.  Significant diagnoses are listed below: Family History  Problem Relation Age of Onset  . Colon cancer Father 40  . Heart attack Father 76  . Prostate cancer Father        dx 56's  . Bladder Cancer Father 37  . Stroke Mother 18  . Diabetes Maternal Grandfather   . Kidney disease Maternal Grandfather   . Asthma Brother   . Cancer Paternal Grandmother 82       GYN cancer ( thinks ovarian, maybe uterine)  . Heart attack Maternal Grandmother 70  . Breast cancer Other 37  . Colon cancer Other 67  . Cancer Other        type unk, age dx unk   Ms. Soria has a 65 year-old daughter and a 82 year-old son with no history of cancer.  Ms. Donlon has 1 sister and 3 brothers listed below: -1 sister is 35 with no history of cancer (utereus and ovaries still intact).  -1 brother is 38 with no history of cancer.  He has 2 daughters in their 45's with no history of cancer.  1 brother is 18 with no history of cancer. He has a son in his 52's with no cancer.   -1 brother is 74 with no history of cancer.  He has a 1 son and 3 daughters.  One of these children (patient's nieces/nephews) is 55/44 years old, and the others are in their 20's.    Ms. Brockway father was diagnosed with colon cancer in his mid-50's.  He was later diagnosed with prostate cancer in his  70's, and then with bladder cancer at 53.  He died at 30, and had a history of smoking.  Ms. Yassin has a paternal uncle who died in his late 25's from Parkinson's disease.  He had 2 sons in their 63's and 20's with no history of cancer.  Ms. Dancer paternal grandfather died in his 73's or 41's in a war.  There is limited information about his family, but no known history of cancer.  Ms. Devilla paternal grandmother died at 4 from a gynecological cancer.  The patient thinks it ws ovarian, but it could have been uterine.  This paternal grandmother had a sister with breast cancer in her 72's, a sister with colon cancer in her  48's, and a brother with an unknown type of cancer.   Ms. Meas mother died at 58 from a stroke.  Ms. Eustace Pen has 1 maternal uncle and 3 maternal aunts listed below: -1 maternal uncle died at 65 due to a heart attack. She had 5 children- 1 child died in his 45's from a blood cancer.  The other 4 children have no history of cancer.   -1 maternal aunt is in her 30's with no history of cancer.  He has 2 daughters and a son with no history of cancer.   -1 maternal aunt died in her late 60's/early 7-'s due to cardiomyopathy. She had 2 daughters with no history of cancer.  -1 maternal aunt died in her late 60's/early 70's due to a heart attack.   Ms. Kaczynski maternal grandfather died in his late 12's70's. There is no other history of cancer known on his side of the family, but there is limited information.  Ms. Creasman maternal grandmother died at 64 from a heart attack.  There is no known history of cancer on her side of the family.   Ms. Trouten is unaware of previous family history of genetic testing for hereditary cancer risks. Patient's maternal ancestors are of [place] descent, and paternal ancestors are of [place] descent. There is no reported Ashkenazi Jewish ancestry. There is no known consanguinity.  GENETIC TEST RESULTS: Genetic testing performed through [facility]'s Multi-cancer Panel reported out on 05/25/2017 showed no pathogenic mutations. The Multi-Cancer Panel offered by [facility] includes sequencing and/or deletion duplication testing of the following 83 genes: ALK, APC, ATM, AXIN2,BAP1,  BARD1, BLM, BMPR1A, BRCA1, BRCA2, BRIP1, CASR, CDC73, CDH1, CDK4, CDKN1B, CDKN1C, CDKN2A (p14ARF), CDKN2A (p16INK4a), CEBPA, CHEK2, CTNNA1, DICER1, DIS3L2, EGFR (c.2369C>T, p.Thr790Met variant only), EPCAM (Deletion/duplication testing only), FH, FLCN, GATA2, GPC3, GREM1 (Promoter region deletion/duplication testing only), HOXB13 (c.251G>A, p.Gly84Glu), HRAS, KIT, MAX, MEN1, MET, MITF (c.952G>A, p.Glu318Lys  variant only), MLH1, MSH2, MSH3, MSH6, MUTYH, NBN, NF1, NF2, NTHL1, PALB2, PDGFRA, PHOX2B, PMS2, POLD1, POLE, POT1, PRKAR1A, PTCH1, PTEN, RAD50, RAD51C, RAD51D, RB1, RECQL4, RET, RUNX1, SDHAF2, SDHA (sequence changes only), SDHB, SDHC, SDHD, SMAD4, SMARCA4, SMARCB1, SMARCE1, STK11, SUFU, TERC, TERT, TMEM127, TP53, TSC1, TSC2, VHL, WRN and WT1. .  A variant of uncertain significance (VUS) in a gene called GATA was also noted.  The test report will be scanned into EPIC and will be located under the Molecular Pathology section of the Results Review tab.A portion of the result report is included below for reference.      We discussed with Ms. Scaff that because current genetic testing is not perfect, it is possible there may be a gene mutation in one of these genes that current testing cannot detect, but that chance is small. We also discussed, that there could be  another gene that has not yet been discovered, or that we have not yet tested, that is responsible for the cancer diagnoses in the family. Therefore, it is important to remain in touch with cancer genetics in the future so that we can continue to offer Ms. Miramontes the most up to date genetic testing.   Regarding the VUS in GATA2: At this time, it is unknown if this variant is associated with increased cancer risk or if this is a normal finding, but most variants such as this get reclassified to being inconsequential. It should not be used to make medical management decisions. With time, we suspect the lab will determine the significance of this variant, if any. If we do learn more about it, we will try to contact Ms. Urquiza to discuss it further. However, it is important to stay in touch with [name] periodically and keep the address and phone number up to date.  ADDITIONAL GENETIC TESTING:We discussed with Ms. Peyton that her genetic testing was fairly extensive.  If there are are genes identified to increase cancer risk that can be analyzed in the  future, we would be happy to discuss and coordinate this testing at that time.    CANCER SCREENING RECOMMENDATIONS: This result is somewhat reassuring and indicates that it is unlikely Ms. Pelzer has an increased risk for a future cancer due to a mutation in one of these genes. This normal test also suggests that Ms. Brune cancers were likely not due to an inherited predisposition associated with one of these genes.  Most cancers happen by chance and this negative test suggests that her cancer may fall into this category.  Therefore, it is recommended she continue to follow the cancer management and screening guidelines provided by her oncology and primary healthcare provider. Other factors such as her personal and family history may still affect her cancer risk.  Based on Ms. Stanfield family history of colon cancer in her father.  We recommend she continue to have colonoscopies every 5 years or as directed by her gastroenterologist.    RECOMMENDATIONS FOR FAMILY MEMBERS: Women in this family might be at some increased risk of developing cancer, over the general population risk, simply due to the family history of cancer. We recommended women in this family have a yearly mammogram beginning at age 82, or 58 years younger than the earliest onset of cancer, an annual clinical breast exam, and perform monthly breast self-exams. Women in this family should also have a gynecological exam as recommended by their primary provider. All family members should have a colonoscopy by age 21.  Ms. Krinsky siblings are all recommended to have colonoscopies every 5 years or directed by their physicians due to the family history of colon cancer int their father.   Based on Ms. Sandles family history of either an ovarian or uterine cancer in her paternal grandmother at 51, we recommended all of her her siblings and paternal relatives (aunts/uncles/cousins, etc) have genetic counseling and testing. Ms. Librizzi will let  us know if we can be of any assistance in coordinating genetic counseling and/or testing for this family member.   FOLLOW-UP: Lastly, we discussed with Ms. North that cancer genetics is a rapidly advancing field and it is possible that new genetic tests will be appropriate for her and/or her family members in the future. We encouraged her to remain in contact with cancer genetics on an annual basis so we can update her personal and family histories and let her know  of advances in cancer genetics that may benefit this family.   Our contact number was provided. Ms. Lemon questions were answered to her satisfaction, and she knows she is welcome to call us at anytime with additional questions or concerns.   Ferol Luz, MS Genetic Counselor [email or web address]

## 2017-06-01 NOTE — Patient Instructions (Addendum)
Dawn [name]  06/01/2017   Your procedure is scheduled on:06/09/17   Report to [facility] Main  Entrance Take [place]  elevators to 3rd floor to  [place] at      [phone].    Call this number if you have problems the morning of surgery [phone]    Remember: ONLY 1 PERSON MAY GO WITH YOU TO SHORT STAY TO GET  READY MORNING OF [place].             EAT A LIGHT DIET THE DAY BEFORE SURGERY EXAMPLES ARE: TOAST, YOGURT, SOUPS, BROTHS, AND                  MASHED POTATOES. AVOID: RAW FRUITS AND VEGGIES, CARBONATED BEVERAGES AND BEANS   Do not eat food or drink liquids :After Midnight.     Take these medicines the morning of surgery : EYE drops as usual                                You may not have any metal on your body including hair pins and              piercings  Do not wear jewelry, make-up, lotions, powders or perfumes, deodorant             Do not wear nail polish.  Do not shave  48 hours prior to surgery.                Do not bring valuables to the hospital. [place].  Contacts, dentures or bridgework may not be worn into surgery.  Leave suitcase in the car. After surgery it may be brought to your room.                 Please read over the following fact sheets you were given: _____________________________________________________________________         [facility] - Preparing for Surgery Before surgery, you can play an important role.  Because skin is not sterile, your skin needs to be as free of germs as possible.  You can reduce the number of germs on your skin by washing with CHG (chlorahexidine gluconate) soap before surgery.  CHG is an antiseptic cleaner which kills germs and bonds with the skin to continue killing germs even after washing. Please DO NOT use if you have an allergy to CHG or antibacterial soaps.  If your skin becomes reddened/irritated stop using the CHG and inform  your nurse when you arrive at Short Stay. Do not shave (including legs and underarms) for at least 48 hours prior to the first CHG shower.  You may shave your face/neck. Please follow these instructions carefully:  1.  Shower with CHG Soap the night before surgery and the  morning of Surgery.  2.  If you choose to wash your hair, wash your hair first as usual with your  normal  shampoo.  3.  After you shampoo, rinse your hair and body thoroughly to remove the  shampoo.                           4.  Use CHG as you would any  other liquid soap.  You can apply chg directly  to the skin and wash                       Gently with a scrungie or clean washcloth.  5.  Apply the CHG Soap to your body ONLY FROM THE NECK DOWN.   Do not use on face/ open                           Wound or open sores. Avoid contact with eyes, ears mouth and genitals (private parts).                       Wash face,  Genitals (private parts) with your normal soap.             6.  Wash thoroughly, paying special attention to the area where your surgery  will be performed.  7.  Thoroughly rinse your body with warm water from the neck down.  8.  DO NOT shower/wash with your normal soap after using and rinsing off  the CHG Soap.                9.  Pat yourself dry with a clean towel.            10.  Wear clean pajamas.            11.  Place clean sheets on your bed the night of your first shower and do not  sleep with pets. Day of Surgery : Do not apply any lotions/deodorants the morning of surgery.  Please wear clean clothes to the hospital/surgery center.  FAILURE TO FOLLOW THESE INSTRUCTIONS MAY RESULT IN THE CANCELLATION OF YOUR SURGERY PATIENT SIGNATURE_________________________________  NURSE SIGNATURE__________________________________  ________________________________________________________________________  WHAT IS A BLOOD TRANSFUSION? Blood Transfusion Information  A transfusion is the replacement of blood or some  of its parts. Blood is made up of multiple cells which provide different functions.  Red blood cells carry oxygen and are used for blood loss replacement.  White blood cells fight against infection.  Platelets control bleeding.  Plasma helps clot blood.  Other blood products are available for specialized needs, such as hemophilia or other clotting disorders. BEFORE THE TRANSFUSION  Who gives blood for transfusions?   Healthy volunteers who are fully evaluated to make sure their blood is safe. This is blood bank blood. Transfusion therapy is the safest it has ever been in the practice of medicine. Before blood is taken from a donor, a complete history is taken to make sure that person has no history of diseases nor engages in risky social behavior (examples are intravenous drug use or sexual activity with multiple partners). The donor's travel history is screened to minimize risk of transmitting infections, such as malaria. The donated blood is tested for signs of infectious diseases, such as HIV and hepatitis. The blood is then tested to be sure it is compatible with you in order to minimize the chance of a transfusion reaction. If you or a relative donates blood, this is often done in anticipation of surgery and is not appropriate for emergency situations. It takes many days to process the donated blood. RISKS AND COMPLICATIONS Although transfusion therapy is very safe and saves many lives, the main dangers of transfusion include:   Getting an infectious disease.  Developing a transfusion reaction. This is an allergic reaction to something in the  blood you were given. Every precaution is taken to prevent this. The decision to have a blood transfusion has been considered carefully by your caregiver before blood is given. Blood is not given unless the benefits outweigh the risks. AFTER THE TRANSFUSION  Right after receiving a blood transfusion, you will usually feel much better and more  energetic. This is especially true if your red blood cells have gotten low (anemic). The transfusion raises the level of the red blood cells which carry oxygen, and this usually causes an energy increase.  The nurse administering the transfusion will monitor you carefully for complications. HOME CARE INSTRUCTIONS  No special instructions are needed after a transfusion. You may find your energy is better. Speak with your caregiver about any limitations on activity for underlying diseases you may have. SEEK MEDICAL CARE IF:   Your condition is not improving after your transfusion.  You develop redness or irritation at the intravenous (IV) site. SEEK IMMEDIATE MEDICAL CARE IF:  Any of the following symptoms occur over the next 12 hours:  Shaking chills.  You have a temperature by mouth above 102 F (38.9 C), not controlled by medicine.  Chest, back, or muscle pain.  People around you feel you are not acting correctly or are confused.  Shortness of breath or difficulty breathing.  Dizziness and fainting.  You get a rash or develop hives.  You have a decrease in urine output.  Your urine turns a dark color or changes to pink, red, or brown. Any of the following symptoms occur over the next 10 days:  You have a temperature by mouth above 102 F (38.9 C), not controlled by medicine.  Shortness of breath.  Weakness after normal activity.  The white part of the eye turns yellow (jaundice).  You have a decrease in the amount of urine or are urinating less often.  Your urine turns a dark color or changes to pink, red, or brown. Document Released: 08/29/2000 Document Revised: 11/24/2011 Document Reviewed: 04/17/2008 ExitCare Patient Information 2014 [place].  _______________________________________________________________________  Incentive Spirometer  An incentive spirometer is a tool that can help keep your lungs clear and active. This tool measures how well you are  filling your lungs with each breath. Taking long deep breaths may help reverse or decrease the chance of developing breathing (pulmonary) problems (especially infection) following:  A long period of time when you are unable to move or be active. BEFORE THE PROCEDURE   If the spirometer includes an indicator to show your best effort, your nurse or respiratory therapist will set it to a desired goal.  If possible, sit up straight or lean slightly forward. Try not to slouch.  Hold the incentive spirometer in an upright position. INSTRUCTIONS FOR USE  1. Sit on the edge of your bed if possible, or sit up as far as you can in bed or on a chair. 2. Hold the incentive spirometer in an upright position. 3. Breathe out normally. 4. Place the mouthpiece in your mouth and seal your lips tightly around it. 5. Breathe in slowly and as deeply as possible, raising the piston or the ball toward the top of the column. 6. Hold your breath for 3-5 seconds or for as long as possible. Allow the piston or ball to fall to the bottom of the column. 7. Remove the mouthpiece from your mouth and breathe out normally. 8. Rest for a few seconds and repeat Steps 1 through 7 at least 10 times every 1-2  hours when you are awake. Take your time and take a few normal breaths between deep breaths. 9. The spirometer may include an indicator to show your best effort. Use the indicator as a goal to work toward during each repetition. 10. After each set of 10 deep breaths, practice coughing to be sure your lungs are clear. If you have an incision (the cut made at the time of surgery), support your incision when coughing by placing a pillow or rolled up towels firmly against it. Once you are able to get out of bed, walk around indoors and cough well. You may stop using the incentive spirometer when instructed by your caregiver.  RISKS AND COMPLICATIONS  Take your time so you do not get dizzy or light-headed.  If you are in pain,  you may need to take or ask for pain medication before doing incentive spirometry. It is harder to take a deep breath if you are having pain. AFTER USE  Rest and breathe slowly and easily.  It can be helpful to keep track of a log of your progress. Your caregiver can provide you with a simple table to help with this. If you are using the spirometer at home, follow these instructions: [place] IF:   You are having difficultly using the spirometer.  You have trouble using the spirometer as often as instructed.  Your pain medication is not giving enough relief while using the spirometer.  You develop fever of 100.5 F (38.1 C) or higher. SEEK IMMEDIATE MEDICAL CARE IF:   You cough up bloody sputum that had not been present before.  You develop fever of 102 F (38.9 C) or greater.  You develop worsening pain at or near the incision site. MAKE SURE YOU:   Understand these instructions.  Will watch your condition.  Will get help right away if you are not doing well or get worse. Document Released: 01/12/2007 Document Revised: 11/24/2011 Document Reviewed: 03/15/2007 [facility] Patient Information 2014 [place], [place].   ________________________________________________________________________

## 2017-06-01 NOTE — Progress Notes (Signed)
EKG 04/28/17 epic Echo 05/07/17 epic  05/07/17 ct chest epic

## 2017-06-04 ENCOUNTER — Encounter (HOSPITAL_COMMUNITY)
Admission: RE | Admit: 2017-06-04 | Discharge: 2017-06-04 | Disposition: A | Discharge status: Home / Self Care | Payer: 59 | Source: Ambulatory Visit | Attending: Gynecologic Oncology | Admitting: Gynecologic Oncology

## 2017-06-04 ENCOUNTER — Encounter (HOSPITAL_COMMUNITY): Payer: Self-pay

## 2017-06-04 ENCOUNTER — Inpatient Hospital Stay (HOSPITAL_COMMUNITY): Admission: RE | Admit: 2017-06-04 | Payer: 59 | Source: Ambulatory Visit

## 2017-06-04 DIAGNOSIS — Z01812 Encounter for preprocedural laboratory examination: Secondary | ICD-10-CM | POA: Diagnosis not present

## 2017-06-04 DIAGNOSIS — C541 Malignant neoplasm of endometrium: Secondary | ICD-10-CM | POA: Diagnosis not present

## 2017-06-04 HISTORY — DX: Gastric varices: I86.4

## 2017-06-04 HISTORY — DX: Other specified postprocedural states: Z98.890

## 2017-06-04 HISTORY — DX: Other specified postprocedural states: R11.2

## 2017-06-04 LAB — CBC
HEMATOCRIT: 38.5 % (ref 36.0–46.0)
HEMOGLOBIN: 13.4 g/dL (ref 12.0–15.0)
MCH: 33.1 pg (ref 26.0–34.0)
MCHC: 34.8 g/dL (ref 30.0–36.0)
MCV: 95.1 fL (ref 78.0–100.0)
Platelets: 259 10*3/uL (ref 150–400)
RBC: 4.05 MIL/uL (ref 3.87–5.11)
RDW: 12.5 % (ref 11.5–15.5)
WBC: 6.8 10*3/uL (ref 4.0–10.5)

## 2017-06-04 LAB — URINALYSIS, ROUTINE W REFLEX MICROSCOPIC
BACTERIA UA: NONE SEEN
BILIRUBIN URINE: NEGATIVE
GLUCOSE, UA: NEGATIVE mg/dL
Ketones, ur: NEGATIVE mg/dL
NITRITE: NEGATIVE
PROTEIN: NEGATIVE mg/dL
Specific Gravity, Urine: 1.004 — ABNORMAL LOW (ref 1.005–1.030)
pH: 8 (ref 5.0–8.0)

## 2017-06-04 LAB — BASIC METABOLIC PANEL
ANION GAP: 9 (ref 5–15)
BUN: 12 mg/dL (ref 6–20)
CO2: 26 mmol/L (ref 22–32)
Calcium: 9.3 mg/dL (ref 8.9–10.3)
Chloride: 103 mmol/L (ref 101–111)
Creatinine, Ser: 0.78 mg/dL (ref 0.44–1.00)
GFR calc Af Amer: >60 mL/min (ref >60)
GFR calc non Af Amer: >60 mL/min (ref >60)
Glucose, Bld: 127 mg/dL — ABNORMAL HIGH (ref 65–99)
POTASSIUM: 3.5 mmol/L (ref 3.5–5.1)
Sodium: 138 mmol/L (ref 135–145)

## 2017-06-04 LAB — HEMOGLOBIN A1C
Hgb A1c MFr Bld: 5.2 % (ref 4.8–5.6)
Mean Plasma Glucose: 102.54 mg/dL

## 2017-06-04 NOTE — Progress Notes (Signed)
FYI ! I saw mrs. Kaseman today at a preop appt. For her surgery and she stated she has a new allery to Cipro in which caused her to break out in hives. I noticed in her orders IV Cipro was ordered on call to OR. Thank you!

## 2017-06-05 LAB — ABO/RH: ABO/RH(D): O POS

## 2017-06-08 ENCOUNTER — Ambulatory Visit: Payer: 59 | Admitting: Hematology

## 2017-06-08 MED ORDER — DEXTROSE 5 % IV SOLN
INTRAVENOUS | Status: DC
  Filled 2017-06-08 (×5): qty 8

## 2017-06-08 NOTE — Anesthesia Preprocedure Evaluation (Addendum)
Anesthesia Evaluation  Patient identified by MRN, date of birth, ID band Patient awake    Reviewed: Allergy & Precautions, H&P , NPO status , Patient's Chart, lab work & pertinent test results  History of Anesthesia Complications (+) PONV  Airway Mallampati: II   Neck ROM: full    Dental  (+) Dental Advisory Given   Pulmonary shortness of breath,    breath sounds clear to auscultation       Cardiovascular hypertension,  Rhythm:regular Rate:Normal  EKG - inferior and anteroseptal Q waves  Echo 04/2017 - Mild LVH. Estimated ejection fraction was in the range of 65% to 70%. Grade 1 diastolic  Dysfunction. No valvulopathy noted.    Neuro/Psych Vertigo negative psych ROS   GI/Hepatic Neg liver ROS, hiatal hernia, GERD  ,  Endo/Other  obesity  Renal/GU negative Renal ROS  negative genitourinary   Musculoskeletal negative musculoskeletal ROS (+) Arthritis ,   Abdominal (+) + obese,   Peds  Hematology negative hematology ROS (+)   Anesthesia Other Findings Endometrial cancer, breast cancer  Reproductive/Obstetrics Breast CA                            Anesthesia Physical  Anesthesia Plan  ASA: II  Anesthesia Plan: General   Post-op Pain Management:  Regional for Post-op pain   Induction: Intravenous  PONV Risk Score and Plan: 4 or greater and Ondansetron, Dexamethasone, Midazolam, Scopolamine patch - Pre-op and Treatment may vary due to age or medical condition  Airway Management Planned: Oral ETT  Additional Equipment: None  Intra-op Plan:   Post-operative Plan: Extubation in OR  Informed Consent: I have reviewed the patients History and Physical, chart, labs and discussed the procedure including the risks, benefits and alternatives for the proposed anesthesia with the patient or authorized representative who has indicated his/her understanding and acceptance.   Dental advisory  given  Plan Discussed with: CRNA  Anesthesia Plan Comments:         Anesthesia Quick Evaluation

## 2017-06-09 ENCOUNTER — Ambulatory Visit (HOSPITAL_COMMUNITY): Payer: 59 | Admitting: Anesthesiology

## 2017-06-09 ENCOUNTER — Encounter (HOSPITAL_COMMUNITY): Payer: Self-pay | Admitting: *Deleted

## 2017-06-09 ENCOUNTER — Telehealth: Payer: Self-pay

## 2017-06-09 ENCOUNTER — Encounter (HOSPITAL_COMMUNITY)
Admission: RE | Disposition: A | Discharge status: Home / Self Care | Payer: Self-pay | Source: Ambulatory Visit | Attending: Gynecologic Oncology

## 2017-06-09 ENCOUNTER — Encounter (HOSPITAL_COMMUNITY): Payer: Self-pay

## 2017-06-09 ENCOUNTER — Ambulatory Visit (HOSPITAL_COMMUNITY)
Admission: RE | Admit: 2017-06-09 | Discharge: 2017-06-09 | Disposition: A | Discharge status: Home / Self Care | Payer: 59 | Source: Ambulatory Visit | Attending: Gynecologic Oncology | Admitting: Gynecologic Oncology

## 2017-06-09 ENCOUNTER — Telehealth: Payer: Self-pay | Admitting: *Deleted

## 2017-06-09 ENCOUNTER — Ambulatory Visit (HOSPITAL_BASED_OUTPATIENT_CLINIC_OR_DEPARTMENT_OTHER)
Admission: RE | Admit: 2017-06-09 | Discharge: 2017-06-10 | Disposition: A | Discharge status: Home / Self Care | Payer: 59 | Source: Ambulatory Visit | Attending: Gynecologic Oncology | Admitting: Gynecologic Oncology

## 2017-06-09 DIAGNOSIS — Z170 Estrogen receptor positive status [ER+]: Secondary | ICD-10-CM | POA: Insufficient documentation

## 2017-06-09 DIAGNOSIS — C50211 Malignant neoplasm of upper-inner quadrant of right female breast: Secondary | ICD-10-CM | POA: Insufficient documentation

## 2017-06-09 DIAGNOSIS — M1990 Unspecified osteoarthritis, unspecified site: Secondary | ICD-10-CM | POA: Diagnosis not present

## 2017-06-09 DIAGNOSIS — K760 Fatty (change of) liver, not elsewhere classified: Secondary | ICD-10-CM | POA: Diagnosis not present

## 2017-06-09 DIAGNOSIS — C541 Malignant neoplasm of endometrium: Secondary | ICD-10-CM | POA: Insufficient documentation

## 2017-06-09 DIAGNOSIS — K648 Other hemorrhoids: Secondary | ICD-10-CM | POA: Diagnosis not present

## 2017-06-09 DIAGNOSIS — R7303 Prediabetes: Secondary | ICD-10-CM | POA: Insufficient documentation

## 2017-06-09 DIAGNOSIS — I10 Essential (primary) hypertension: Secondary | ICD-10-CM

## 2017-06-09 DIAGNOSIS — Z880 Allergy status to penicillin: Secondary | ICD-10-CM | POA: Diagnosis not present

## 2017-06-09 DIAGNOSIS — Z8542 Personal history of malignant neoplasm of other parts of uterus: Secondary | ICD-10-CM | POA: Insufficient documentation

## 2017-06-09 DIAGNOSIS — C775 Secondary and unspecified malignant neoplasm of intrapelvic lymph nodes: Secondary | ICD-10-CM | POA: Diagnosis not present

## 2017-06-09 DIAGNOSIS — E785 Hyperlipidemia, unspecified: Secondary | ICD-10-CM | POA: Insufficient documentation

## 2017-06-09 DIAGNOSIS — Z79899 Other long term (current) drug therapy: Secondary | ICD-10-CM | POA: Insufficient documentation

## 2017-06-09 DIAGNOSIS — Z01812 Encounter for preprocedural laboratory examination: Secondary | ICD-10-CM | POA: Diagnosis not present

## 2017-06-09 HISTORY — PX: SENTINEL NODE BIOPSY: SHX6608

## 2017-06-09 HISTORY — PX: ROBOTIC ASSISTED TOTAL HYSTERECTOMY WITH BILATERAL SALPINGO OOPHERECTOMY: SHX6086

## 2017-06-09 LAB — TYPE AND SCREEN
ABO/RH(D): O POS
Antibody Screen: NEGATIVE

## 2017-06-09 SURGERY — HYSTERECTOMY, TOTAL, ROBOT-ASSISTED, LAPAROSCOPIC, WITH BILATERAL SALPINGO-OOPHORECTOMY
Anesthesia: General

## 2017-06-09 MED ORDER — ONDANSETRON HCL 4 MG/2ML IJ SOLN: 4.0000 mg | Freq: Once | INTRAMUSCULAR | Status: DC | PRN

## 2017-06-09 MED ORDER — PROPOFOL 10 MG/ML IV BOLUS
INTRAVENOUS | Status: DC | PRN
  Administered 2017-06-09: 150 mg via INTRAVENOUS

## 2017-06-09 MED ORDER — FENTANYL CITRATE (PF) 100 MCG/2ML IJ SOLN: 25.0000 ug | INTRAMUSCULAR | Status: DC | PRN

## 2017-06-09 MED ORDER — SUGAMMADEX SODIUM 200 MG/2ML IV SOLN
INTRAVENOUS | Status: AC
  Filled 2017-06-09: qty 2

## 2017-06-09 MED ORDER — ONDANSETRON HCL 4 MG/2ML IJ SOLN: 4.0000 mg | Freq: Four times a day (QID) | INTRAMUSCULAR | Status: DC | PRN

## 2017-06-09 MED ORDER — KCL IN DEXTROSE-NACL 20-5-0.45 MEQ/L-%-% IV SOLN
INTRAVENOUS | Status: DC
  Administered 2017-06-09: 12:00:00 via INTRAVENOUS
  Filled 2017-06-09 (×2): qty 1000

## 2017-06-09 MED ORDER — LACTATED RINGERS IR SOLN
Status: DC | PRN
  Administered 2017-06-09: 1000 mL

## 2017-06-09 MED ORDER — SCOPOLAMINE 1 MG/3DAYS TD PT72
MEDICATED_PATCH | TRANSDERMAL | Status: AC
  Filled 2017-06-09: qty 1

## 2017-06-09 MED ORDER — FENTANYL CITRATE (PF) 100 MCG/2ML IJ SOLN
INTRAMUSCULAR | Status: AC
  Administered 2017-06-09: 50 ug via INTRAVENOUS
  Filled 2017-06-09: qty 2

## 2017-06-09 MED ORDER — SENNOSIDES-DOCUSATE SODIUM 8.6-50 MG PO TABS
2.0000 | ORAL_TABLET | Freq: Every day | ORAL | Status: DC
  Administered 2017-06-09: 2 via ORAL
  Filled 2017-06-09 (×2): qty 2

## 2017-06-09 MED ORDER — GABAPENTIN 300 MG PO CAPS
300.0000 mg | ORAL_CAPSULE | Freq: Every day | ORAL | Status: AC
  Administered 2017-06-09: 300 mg via ORAL
  Filled 2017-06-09: qty 1

## 2017-06-09 MED ORDER — ENOXAPARIN SODIUM 40 MG/0.4ML ~~LOC~~ SOLN
40.0000 mg | SUBCUTANEOUS | Status: AC
  Administered 2017-06-09: 40 mg via SUBCUTANEOUS
  Filled 2017-06-09 (×5): qty 0.4

## 2017-06-09 MED ORDER — MIDAZOLAM HCL 2 MG/2ML IJ SOLN
INTRAMUSCULAR | Status: AC
  Filled 2017-06-09: qty 2

## 2017-06-09 MED ORDER — ENOXAPARIN SODIUM 40 MG/0.4ML ~~LOC~~ SOLN
40.0000 mg | SUBCUTANEOUS | Status: DC
  Administered 2017-06-10: 40 mg via SUBCUTANEOUS
  Filled 2017-06-09: qty 0.4

## 2017-06-09 MED ORDER — FENTANYL CITRATE (PF) 250 MCG/5ML IJ SOLN
INTRAMUSCULAR | Status: AC
  Filled 2017-06-09: qty 5

## 2017-06-09 MED ORDER — SUGAMMADEX SODIUM 200 MG/2ML IV SOLN
INTRAVENOUS | Status: DC | PRN
  Administered 2017-06-09: 170 mg via INTRAVENOUS

## 2017-06-09 MED ORDER — LACTATED RINGERS IV SOLN
INTRAVENOUS | Status: DC | PRN
  Administered 2017-06-09: 07:00:00 via INTRAVENOUS

## 2017-06-09 MED ORDER — SCOPOLAMINE 1 MG/3DAYS TD PT72
MEDICATED_PATCH | TRANSDERMAL | Status: DC | PRN
  Administered 2017-06-09: 1 via TRANSDERMAL

## 2017-06-09 MED ORDER — EPHEDRINE SULFATE-NACL 50-0.9 MG/10ML-% IV SOSY
PREFILLED_SYRINGE | INTRAVENOUS | Status: DC | PRN
  Administered 2017-06-09: 5 mg via INTRAVENOUS
  Administered 2017-06-09: 10 mg via INTRAVENOUS
  Administered 2017-06-09: 5 mg via INTRAVENOUS

## 2017-06-09 MED ORDER — ONDANSETRON HCL 4 MG PO TABS: 4.0000 mg | ORAL_TABLET | Freq: Four times a day (QID) | ORAL | Status: DC | PRN

## 2017-06-09 MED ORDER — FENTANYL CITRATE (PF) 100 MCG/2ML IJ SOLN
25.0000 ug | INTRAMUSCULAR | Status: DC | PRN
  Administered 2017-06-09 (×2): 50 ug via INTRAVENOUS

## 2017-06-09 MED ORDER — IBUPROFEN 800 MG PO TABS: 800.0000 mg | ORAL_TABLET | Freq: Three times a day (TID) | ORAL | Status: DC | PRN

## 2017-06-09 MED ORDER — ONDANSETRON HCL 4 MG/2ML IJ SOLN
INTRAMUSCULAR | Status: DC | PRN
  Administered 2017-06-09: 4 mg via INTRAVENOUS

## 2017-06-09 MED ORDER — ROCURONIUM BROMIDE 10 MG/ML (PF) SYRINGE
PREFILLED_SYRINGE | INTRAVENOUS | Status: DC | PRN
Start: 2017-06-09 — End: 2017-06-09
  Administered 2017-06-09: 50 mg via INTRAVENOUS

## 2017-06-09 MED ORDER — OXYCODONE-ACETAMINOPHEN 5-325 MG PO TABS
1.0000 | ORAL_TABLET | ORAL | Status: DC | PRN
  Administered 2017-06-09: 2 via ORAL
  Administered 2017-06-09: 1 via ORAL
  Administered 2017-06-10: 2 via ORAL
  Filled 2017-06-09 (×2): qty 1
  Filled 2017-06-09: qty 2
  Filled 2017-06-09: qty 1

## 2017-06-09 MED ORDER — TRAMADOL HCL 50 MG PO TABS: 100.0000 mg | ORAL_TABLET | Freq: Two times a day (BID) | ORAL | Status: DC | PRN

## 2017-06-09 MED ORDER — DEXAMETHASONE SODIUM PHOSPHATE 10 MG/ML IJ SOLN
INTRAMUSCULAR | Status: AC
  Filled 2017-06-09: qty 1

## 2017-06-09 MED ORDER — LACTATED RINGERS IV SOLN: INTRAVENOUS | Status: DC

## 2017-06-09 MED ORDER — LIDOCAINE 2% (20 MG/ML) 5 ML SYRINGE
INTRAMUSCULAR | Status: DC | PRN
  Administered 2017-06-09: 80 mg via INTRAVENOUS

## 2017-06-09 MED ORDER — HYDROCHLOROTHIAZIDE 12.5 MG PO CAPS
12.5000 mg | ORAL_CAPSULE | Freq: Every day | ORAL | Status: DC
Start: 2017-06-09 — End: 2017-06-10
  Administered 2017-06-10: 12.5 mg via ORAL
  Filled 2017-06-09: qty 1

## 2017-06-09 MED ORDER — ROCURONIUM BROMIDE 50 MG/5ML IV SOSY
PREFILLED_SYRINGE | INTRAVENOUS | Status: AC
  Filled 2017-06-09: qty 5

## 2017-06-09 MED ORDER — HYDROMORPHONE HCL 1 MG/ML IJ SOLN: 0.2000 mg | INTRAMUSCULAR | Status: DC | PRN

## 2017-06-09 MED ORDER — ONDANSETRON HCL 4 MG/2ML IJ SOLN
INTRAMUSCULAR | Status: AC
  Filled 2017-06-09: qty 2

## 2017-06-09 MED ORDER — IRBESARTAN 150 MG PO TABS
150.0000 mg | ORAL_TABLET | Freq: Every day | ORAL | Status: DC
  Administered 2017-06-10: 150 mg via ORAL
  Filled 2017-06-09: qty 1

## 2017-06-09 MED ORDER — OLMESARTAN MEDOXOMIL-HCTZ 20-12.5 MG PO TABS: 1.0000 | ORAL_TABLET | Freq: Every day | ORAL | Status: DC

## 2017-06-09 MED ORDER — DEXAMETHASONE SODIUM PHOSPHATE 10 MG/ML IJ SOLN
INTRAMUSCULAR | Status: DC | PRN
  Administered 2017-06-09: 10 mg via INTRAVENOUS

## 2017-06-09 MED ORDER — GENTAMICIN SULFATE 40 MG/ML IJ SOLN
INTRAVENOUS | Status: AC
  Administered 2017-06-09: 08:00:00 via INTRAVENOUS
  Filled 2017-06-09: qty 8

## 2017-06-09 MED ORDER — STERILE WATER FOR IRRIGATION IR SOLN
Status: DC | PRN
  Administered 2017-06-09: 1000 mL

## 2017-06-09 MED ORDER — MIDAZOLAM HCL 5 MG/5ML IJ SOLN
INTRAMUSCULAR | Status: DC | PRN
  Administered 2017-06-09: 2 mg via INTRAVENOUS

## 2017-06-09 MED ORDER — FENTANYL CITRATE (PF) 100 MCG/2ML IJ SOLN
INTRAMUSCULAR | Status: DC | PRN
  Administered 2017-06-09 (×3): 50 ug via INTRAVENOUS
  Administered 2017-06-09: 100 ug via INTRAVENOUS

## 2017-06-09 MED ORDER — EPHEDRINE 5 MG/ML INJ
INTRAVENOUS | Status: AC
  Filled 2017-06-09: qty 10

## 2017-06-09 MED ORDER — STERILE WATER FOR INJECTION IJ SOLN
INTRAMUSCULAR | Status: DC | PRN
  Administered 2017-06-09: 10 mL

## 2017-06-09 MED ORDER — PROPOFOL 10 MG/ML IV BOLUS
INTRAVENOUS | Status: AC
  Filled 2017-06-09: qty 20

## 2017-06-09 MED FILL — OLMESARTAN-HCTZ 20-12.5 MG: 20-12.5 | 90 days supply | Qty: 90 | Fill #0

## 2017-06-09 SURGICAL SUPPLY — 47 items
APPLICATOR SURGIFLO ENDO (HEMOSTASIS) IMPLANT
BAG LAPAROSCOPIC 12 15 PORT 16 (BASKET) IMPLANT
BAG RETRIEVAL 12/15 (BASKET)
COVER BACK TABLE 60X90IN (DRAPES) ×2 IMPLANT
COVER TIP SHEARS 8 DVNC (MISCELLANEOUS) ×1 IMPLANT
COVER TIP SHEARS 8MM DA VINCI (MISCELLANEOUS) ×1
DERMABOND ADVANCED (GAUZE/BANDAGES/DRESSINGS) ×1
DERMABOND ADVANCED .7 DNX12 (GAUZE/BANDAGES/DRESSINGS) ×1 IMPLANT
DRAPE ARM DVNC X/XI (DISPOSABLE) ×4 IMPLANT
DRAPE COLUMN DVNC XI (DISPOSABLE) ×1 IMPLANT
DRAPE DA VINCI XI ARM (DISPOSABLE) ×4
DRAPE DA VINCI XI COLUMN (DISPOSABLE) ×1
DRAPE SHEET LG 3/4 BI-LAMINATE (DRAPES) ×2 IMPLANT
DRAPE SURG IRRIG POUCH 19X23 (DRAPES) ×2 IMPLANT
ELECT REM PT RETURN 15FT ADLT (MISCELLANEOUS) ×2 IMPLANT
GLOVE BIO SURGEON STRL SZ 6 (GLOVE) ×8 IMPLANT
GLOVE BIO SURGEON STRL SZ 6.5 (GLOVE) ×4 IMPLANT
GOWN STRL REUS W/ TWL LRG LVL3 (GOWN DISPOSABLE) ×3 IMPLANT
GOWN STRL REUS W/TWL LRG LVL3 (GOWN DISPOSABLE) ×3
HOLDER FOLEY CATH W/STRAP (MISCELLANEOUS) ×2 IMPLANT
IRRIG SUCT STRYKERFLOW 2 WTIP (MISCELLANEOUS) ×2
IRRIGATION SUCT STRKRFLW 2 WTP (MISCELLANEOUS) ×1 IMPLANT
KIT PROCEDURE DA VINCI SI (MISCELLANEOUS) ×1
KIT PROCEDURE DVNC SI (MISCELLANEOUS) ×1 IMPLANT
MANIPULATOR UTERINE 4.5 ZUMI (MISCELLANEOUS) ×2 IMPLANT
NDL SAFETY ECLIPSE 18X1.5 (NEEDLE) ×1 IMPLANT
NEEDLE HYPO 18GX1.5 SHARP (NEEDLE) ×1
NEEDLE SPNL 18GX3.5 QUINCKE PK (NEEDLE) ×2 IMPLANT
OBTURATOR OPTICAL STANDARD 8MM (TROCAR) ×1
OBTURATOR OPTICAL STND 8 DVNC (TROCAR) ×1
OBTURATOR OPTICALSTD 8 DVNC (TROCAR) ×1 IMPLANT
PACK ROBOT GYN CUSTOM WL (TRAY / TRAY PROCEDURE) ×2 IMPLANT
PAD POSITIONING PINK XL (MISCELLANEOUS) ×2 IMPLANT
POUCH SPECIMEN RETRIEVAL 10MM (ENDOMECHANICALS) IMPLANT
SEAL CANN UNIV 5-8 DVNC XI (MISCELLANEOUS) ×4 IMPLANT
SEAL XI 5MM-8MM UNIVERSAL (MISCELLANEOUS) ×4
SET TRI-LUMEN FLTR TB AIRSEAL (TUBING) ×2 IMPLANT
SOLUTION ELECTROLUBE (MISCELLANEOUS) ×2 IMPLANT
SURGIFLO W/THROMBIN 8M KIT (HEMOSTASIS) IMPLANT
SUT VIC AB 0 CT1 27 (SUTURE)
SUT VIC AB 0 CT1 27XBRD ANTBC (SUTURE) IMPLANT
SYR 10ML LL (SYRINGE) ×2 IMPLANT
TOWEL OR NON WOVEN STRL DISP B (DISPOSABLE) ×2 IMPLANT
TRAP SPECIMEN MUCOUS 40CC (MISCELLANEOUS) IMPLANT
TRAY FOLEY W/METER SILVER 16FR (SET/KITS/TRAYS/PACK) ×2 IMPLANT
UNDERPAD 30X30 (UNDERPADS AND DIAPERS) ×2 IMPLANT
WATER STERILE IRR 1000ML POUR (IV SOLUTION) IMPLANT

## 2017-06-09 NOTE — Transfer of Care (Signed)
Immediate Anesthesia Transfer of Care Note  Patient: Dawn [name]  Procedure(s) Performed: Procedure(s): XI ROBOTIC ASSISTED TOTAL LAPOROSCOPIC HYSTERECTOMY WITH BILATERAL SALPINGO OOPHORECTOMY (N/A) SENTINEL NODE BIOPSY (N/A)  Patient Location: PACU  Anesthesia Type:General  Level of Consciousness: awake, alert , oriented and patient cooperative  Airway & Oxygen Therapy: Patient Spontanous Breathing and Patient connected to face mask oxygen  Post-op Assessment: Report given to RN, Post -op Vital signs reviewed and stable and Patient moving all extremities  Post vital signs: Reviewed and stable  Last Vitals:  Vitals:   06/09/17 0546  BP: (!) 130/92  Pulse: 76  Resp: 18  Temp: 36.6 C  SpO2: 97%    Last Pain:  Vitals:   06/09/17 0546  TempSrc: Oral         Complications: No apparent anesthesia complications

## 2017-06-09 NOTE — Op Note (Signed)
OPERATIVE NOTE 06/09/17  Surgeon: Donaciano Eva   Assistants: Dr Lahoma Crocker (an MD assistant was necessary for tissue manipulation, management of robotic instrumentation, retraction and positioning due to the complexity of the case and hospital policies).   Anesthesia: General endotracheal anesthesia  ASA Class: 3   Pre-operative Diagnosis: endometrial cancer grade (unknown)  Post-operative Diagnosis: same  Operation: Robotic-assisted laparoscopic total hysterectomy with bilateral salpingoophorectomy, SLN biopsy   Surgeon: Donaciano Eva  Assistant Surgeon: Lahoma Crocker MD  Anesthesia: GET  Urine Output: 300cc  Operative Findings:  : 6cm uterus, normal tubes and ovaries, no suspicious lymph nodes  Estimated Blood Loss:  <25      Total IV Fluids: 800 ml         Specimens: uterus, cervix, bilateral tubes and ovaries, right external iliac SLN, left obturator SLN, left external iliac SLN         Complications:  None; patient tolerated the procedure well.         Disposition: PACU - hemodynamically stable.  Procedure Details  The patient was seen in the Holding Room. The risks, benefits, complications, treatment options, and expected outcomes were discussed with the patient.  The patient concurred with the proposed plan, giving informed consent.  The site of surgery properly noted/marked. The patient was identified as Dawn [name] and the procedure verified as a Robotic-assisted hysterectomy with bilateral salpingo oophorectomy with SLN biopsy. A Time Out was held and the above information confirmed.  After induction of anesthesia, the patient was draped and prepped in the usual sterile manner. Pt was placed in supine position after anesthesia and draped and prepped in the usual sterile manner. The abdominal drape was placed after the CholoraPrep had been allowed to dry for 3 minutes.  Her arms were tucked to her side with all appropriate precautions.   The shoulders were stabilized with padded shoulder blocks applied to the acromium processes.  The patient was placed in the semi-lithotomy position in [place].  The perineum was prepped with Betadine. The patient was then prepped. Foley catheter was placed.  A sterile speculum was placed in the vagina.  The cervix was grasped with a single-tooth tenaculum. 2mg  total of ICG was injected into the cervical stroma at 2 and 9 o'clock with 1cc injected at a 1cm and 7mm depth (concentration 0.5mg /ml) in all locations. The cervix was dilated with Kennon Rounds dilators.  The ZUMI uterine manipulator with a medium colpotomizer ring was placed without difficulty.  A pneum occluder balloon was placed over the manipulator.  OG tube placement was confirmed and to suction.   Next, a 5 mm skin incision was made 1 cm below the subcostal margin in the midclavicular line.  The 5 mm Optiview port and scope was used for direct entry.  Opening pressure was under 10 mm CO2.  The abdomen was insufflated and the findings were noted as above.   At this point and all points during the procedure, the patient's intra-abdominal pressure did not exceed 15 mmHg. Next, a 10 mm skin incision was made in the umbilicus and a right and left port was placed about 10 cm lateral to the robot port on the right and left side.  A fourth arm was placed in the left lower quadrant 2 cm above and superior and medial to the anterior superior iliac spine.  All ports were placed under direct visualization.  The patient was placed in steep Trendelenburg.  Bowel was folded away into the upper abdomen.  The robot was docked in the normal manner.  The right and left peritoneum were opened parallel to the IP ligament to open the retroperitoneal spaces bilaterally. The SLN mapping was performed in bilateral pelvic basins. The para rectal and paravesical spaces were opened up entirely with careful dissection below the level of the ureters bilaterally and to the depth  of the uterine artery origin in order to skeletonize the uterine "web" and ensure visualization of all parametrial channels. The para-aortic basins were carefully exposed and evaluated for isolated para-aortic SLN's. Lymphatic channels were identified travelling to the following visualized sentinel lymph node's: right external iliac, left external iliac and obturator. These SLN's were separated from their surrounding lymphatic tissue, removed and sent for permanent pathology.  The hysterectomy was started after the round ligament on the right side was incised and the retroperitoneum was entered and the pararectal space was developed.  The ureter was noted to be on the medial leaf of the broad ligament.  The peritoneum above the ureter was incised and stretched and the infundibulopelvic ligament was skeletonized, cauterized and cut.  The posterior peritoneum was taken down to the level of the KOH ring.  The anterior peritoneum was also taken down.  The bladder flap was created to the level of the KOH ring.  The uterine artery on the right side was skeletonized, cauterized and cut in the normal manner.  A similar procedure was performed on the left.  The colpotomy was made and the uterus, cervix, bilateral ovaries and tubes were amputated and delivered through the vagina.  Pedicles were inspected and excellent hemostasis was achieved.    The colpotomy at the vaginal cuff was closed with Vicryl on a CT1 needle in a running manner.  Irrigation was used and excellent hemostasis was achieved.  At this point in the procedure was completed.  Robotic instruments were removed under direct visulaization.  The robot was undocked. The 10 mm ports were closed with Vicryl on a UR-5 needle and the fascia was closed with 0 Vicryl on a UR-5 needle.  The skin was closed with 4-0 Vicryl in a subcuticular manner.  Dermabond was applied.  Sponge, lap and needle counts correct x 2.  The patient was taken to the recovery room in stable  condition.  The vagina was swabbed with  minimal bleeding noted.   All instrument and needle counts were correct x  3.   The patient was transferred to the recovery room in stable condition.  Donaciano Eva, MD

## 2017-06-09 NOTE — Telephone Encounter (Signed)
Pharmacy called and wanted ok to fill benicar HCT as generic.  She has been getting generic up until now, but they needed an ok.

## 2017-06-09 NOTE — H&P (Signed)
Consult Note: Gyn-Onc  Consult was requested by Dr. Lynnette Caffey for the evaluation of CHINA DEITRICK 66 y.o. female  CC:     Chief Complaint  Patient presents with  . Endometrial cancer [facility])    Assessment/Plan:  Ms. AYA GEISEL  is a 66 y.o.  year old with endometrial cancer. She also has a synchronous diagnosis of breast cancer (ER pos, PR neg, Her2-neu pos).  A detailed discussion was held with the patient and her family with regard to to her endometrial cancer diagnosis. We discussed the standard management options for uterine cancer which includes surgery followed possibly by adjuvant therapy depending on the results of surgery. The options for surgical management include a hysterectomy and removal of the tubes and ovaries possibly with removal of pelvic and para-aortic lymph nodes.If feasible, a minimally invasive approach including a robotic hysterectomy or laparoscopic hysterectomy have benefits including shorter hospital stay, recovery time and better wound healing than with open surgery. The patient has been counseled about these surgical options and the risks of surgery in general including infection, bleeding, damage to surrounding structures (including bowel, bladder, ureters, nerves or vessels), and the postoperative risks of PE/ DVT, and lymphedema. I extensively reviewed the additional risks of robotic hysterectomy including possible need for conversion to open laparotomy.  I discussed positioning during surgery of trendelenberg and risks of minor facial swelling and care we take in preoperative positioning.  After counseling and consideration of her options, she desires to proceed with robotic assisted total hysterectomy with bilateral sapingo-oophorectomy and SLN biopsy.     HPI: Genessa Beman is a 66 year old P2 who is seen in consultation at the request of Dr Lynnette Caffey for endometrial cancer. The patient began having symptoms of bloating and postmenopausal bleeding  since mid July, 2018.   Dr Lynnette Caffey performed an [name] which showed a thickened endometrium. Pap was normal in May, 2018.  On 04/29/17 she underwent D&C which showed endometrioid adenocarcinoma, there was some question as to whether this was endometrial or endocervical in origin.   In the interim the patient underwent routine mammography which revealed a suspicious lesion on the right. This was biopsied and showed invasive ductal carcinoma, grade 2, ER pos, PR neg, Her2 neu pos. She was recommended to have lumpectomy, SLN biopsy and chemo with radiation.  Genetic testing for BRCA was negative.  Current Meds:      Outpatient Encounter Prescriptions as of 05/20/2017  Medication Sig  . acetaminophen (TYLENOL) 325 MG tablet Take 650 mg by mouth every 6 (six) hours as needed for mild pain or moderate pain.  Marland Kitchen BENICAR HCT 20-12.5 MG tablet TAKE 1 TABLET BY MOUTH DAILY.  . benzonatate (TESSALON) 200 MG capsule Take 1 capsule (200 mg total) by mouth 3 (three) times daily as needed for cough.  . Multiple Vitamin (MULTIVITAMIN) capsule Take 1 capsule by mouth daily.    . sodium chloride (MURO 128) 5 % ophthalmic ointment Place 1 application into both eyes at bedtime.  . vitamin C (ASCORBIC ACID) 500 MG tablet Take 1,000 mg by mouth daily.   . [DISCONTINUED] raloxifene (EVISTA) 60 MG tablet Take 1 tablet by mouth daily.  . ferrous sulfate 325 (65 FE) MG tablet Take 1 tablet (325 mg total) by mouth daily. (Patient not taking: Reported on 05/20/2017)  . fluticasone (FLONASE) 50 MCG/ACT nasal spray Place 1 spray into both nostrils daily. I-2 SPRAYS AS NEEDED. (Patient not taking: Reported on 05/20/2017)  . ibuprofen (ADVIL,MOTRIN)  600 MG tablet Take 1 tablet (600 mg total) by mouth every 6 (six) hours as needed. (Patient not taking: Reported on 05/20/2017)  . meclizine (ANTIVERT) 25 MG tablet Take 1-2 tablets (25-50 mg total) by mouth 3 (three) times daily as needed for dizziness. (Patient not taking: Reported on  05/20/2017)  . ondansetron (ZOFRAN) 4 MG tablet Take 1 tablet (4 mg total) by mouth every 8 (eight) hours as needed for nausea or vomiting. (Patient not taking: Reported on 05/20/2017)  . oxyCODONE-acetaminophen (ROXICET) 5-325 MG tablet Take 1-2 tablets by mouth every 4 (four) hours as needed for severe pain. (Patient not taking: Reported on 05/20/2017)  . Pitavastatin Calcium (LIVALO) 2 MG TABS TAKE 1 & 1/2 TABLETS (3 MG TOTAL) BY MOUTH AT BEDTIME. (Patient not taking: Reported on 05/20/2017)   No facility-administered encounter medications on file as of 05/20/2017.     Allergy:       Allergies  Allergen Reactions  . Crestor [Rosuvastatin Calcium] Other (See Comments)    Myalgias   . Penicillins Rash    Has patient had a PCN reaction causing immediate rash, facial/tongue/throat swelling, SOB or lightheadedness with hypotension: Yes Has patient had a PCN reaction causing severe rash involving mucus membranes or skin necrosis: Yes Has patient had a PCN reaction that required hospitalization: No Has patient had a PCN reaction occurring within the last 10 years: No If all of the above answers are "NO", then may proceed with Cephalosporin use.     Social Hx:   Social History        Social History  . Marital status: Married    Spouse name: N/A  . Number of children: N/A  . Years of education: N/A        Occupational History  .  [employer]          Social History Main Topics  . Smoking status: Never Smoker  . Smokeless tobacco: Never Used  . Alcohol use 0.0 oz/week     Comment: <1/week wine or beer  . Drug use: No  . Sexual activity: Not on file       Other Topics Concern  . Not on file      Social History Narrative   2-3 caffeine drinks per day. Regular exercise.  Walking 3 miles a day.     Past Surgical Hx:       Past Surgical History:  Procedure Laterality Date  . BREAST BIOPSY Right 04/27/2017  .  COLONOSCOPY    . HYSTEROSCOPY W/D&C N/A 04/29/2017   Procedure: DILATATION AND CURETTAGE /HYSTEROSCOPY;  Surgeon: Linda Hedges, DO;  Location: [facility];  Service: Gynecology;  Laterality: N/A;    Past Medical Hx:      Past Medical History:  Diagnosis Date  . Anemia    as a child.  . Arthritis   . Bilateral cataracts   . Bilateral leg cramps   . Cancer ([place])    skin  . Dyspnea   . Family history of bladder cancer   . Family history of colon cancer in father   . Fuchs' corneal dystrophy   . GERD (gastroesophageal reflux disease)   . Hearing loss    Right side 30%  . Heart murmur    childhood  . History of hiatal hernia    small size  . Hyperlipidemia   . Hypertension   . IBS (irritable bowel syndrome)   . Malignant neoplasm of upper-inner  quadrant of right female breast ([place])   . NAFL (nonalcoholic fatty liver)   . Obesity   . Pre-diabetes   . 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 Uterine cancer ([place])   . Vertigo     Past Gynecological History:  SVD x 2 No LMP recorded. Patient is postmenopausal.  Family Hx:       Family History  Problem Relation Age of Onset  . Colon cancer Father 54  . Heart attack Father 75  . Prostate cancer Father        dx 25's  . Bladder Cancer Father 77  . Stroke Mother 32  . Diabetes Maternal Grandfather   . Kidney disease Maternal Grandfather   . Asthma Brother   . Cancer Paternal Grandmother 49       GYN cancer ( thinks ovarian, maybe uterine)  . Heart attack Maternal Grandmother 70  . Breast cancer Other 18  . Colon cancer Other 44  . Cancer Other        type unk, age dx unk    Review of Systems:  Constitutional  Feels well,    ENT Normal appearing ears and nares bilaterally Skin/Breast  No rash, sores, jaundice, itching, dryness Cardiovascular  No chest pain, shortness of breath, or edema  Pulmonary  No cough or wheeze.  Gastro  Intestinal  No nausea, vomitting, or diarrhoea. No bright red blood per rectum, no abdominal pain, change in bowel movement, or constipation.  Genito Urinary  No frequency, urgency, dysuria, + bleeding Musculo Skeletal  No myalgia, arthralgia, joint swelling or pain  Neurologic  No weakness, numbness, change in gait,  Psychology  No depression, anxiety, insomnia.   Vitals:  Blood pressure 130/76, pulse 77, temperature 98.4 F (36.9 C), temperature source Oral, resp. rate 18, height 5' 2.99" (1.6 m), weight 182 lb (82.6 kg), SpO2 100 %.  Physical Exam: WD in NAD Neck  Supple NROM, without any enlargements.  Lymph Node Survey No cervical supraclavicular or inguinal adenopathy Cardiovascular  Pulse normal rate, regularity and rhythm. S1 and S2 normal.  Lungs  Clear to auscultation bilateraly, without wheezes/crackles/rhonchi. Good air movement.  Skin  No rash/lesions/breakdown  Psychiatry  Alert and oriented to person, place, and time  Abdomen  Normoactive bowel sounds, abdomen soft, non-tender and obese without evidence of hernia.  Back No CVA tenderness Genito Urinary  Vulva/vagina: Normal external female genitalia.  No lesions. No discharge or bleeding.             Bladder/urethra:  No lesions or masses, well supported bladder             Vagina: normal             Cervix: Normal appearing, no lesions.             Uterus:  Small, mobile, no parametrial involvement or nodularity.             Adnexa: no masses. Rectal  Good tone, no masses no cul de sac nodularity.  Extremities  No bilateral cyanosis, clubbing or edema.   Donaciano Eva, MD

## 2017-06-09 NOTE — Telephone Encounter (Signed)
Scheduled post op appt per staff message, patient to receive appt with discharge summary

## 2017-06-09 NOTE — Discharge Instructions (Signed)
06/09/2017  Return to work: 4 weeks  Activity: 1. Be up and out of the bed during the day.  Take a nap if needed.  You may walk up steps but be careful and use the hand rail.  Stair climbing will tire you more than you think, you may need to stop part way and rest.   2. No lifting or straining for 6 weeks.  3. No driving for 1 weeks.  Do Not drive if you are taking narcotic pain medicine.  4. Shower daily.  Use soap and water on your incision and pat dry; don't rub.   5. No sexual activity and nothing in the vagina for 8 weeks.  Medications:  - Take ibuprofen and tylenol first line for pain control. Take these regularly (every 6 hours) to decrease the build up of pain.  - If necessary, for severe pain not relieved by ibuprofen, take percocet.  - While taking percocet you should take sennakot every night to reduce the likelihood of constipation. If this causes diarrhea, stop its use.  Diet: 1. Low sodium Heart Healthy Diet is recommended.  2. It is safe to use a laxative if you have difficulty moving your bowels.   Wound Care: 1. Keep clean and dry.  Shower daily.  Reasons to call the Doctor:   Fever - Oral temperature greater than 100.4 degrees Fahrenheit  Foul-smelling vaginal discharge  Difficulty urinating  Nausea and vomiting  Increased pain at the site of the incision that is unrelieved with pain medicine.  Difficulty breathing with or without chest pain  New calf pain especially if only on one side  Sudden, continuing increased vaginal bleeding with or without clots.   Follow-up: 1. See Everitt Amber in 3 weeks.  Contacts: For questions or concerns you should contact:  Dr. Everitt Amber at [phone] After hours and on week-ends call [phone] and ask to speak to the physician on call for Gynecologic Oncology

## 2017-06-09 NOTE — Anesthesia Procedure Notes (Signed)
Procedure Name: Intubation Date/Time: 06/09/2017 7:50 AM Performed by: Carleene Cooper A Pre-anesthesia Checklist: Patient identified, Emergency Drugs available, Suction available, Patient being monitored and Timeout performed Patient Re-evaluated:Patient Re-evaluated prior to induction Oxygen Delivery Method: Circle system utilized Preoxygenation: Pre-oxygenation with 100% oxygen Induction Type: IV induction Ventilation: Mask ventilation without difficulty Laryngoscope Size: Mac and 4 Grade View: Grade I Tube type: Oral Tube size: 7.5 mm Number of attempts: 1 Airway Equipment and Method: Stylet Placement Confirmation: ETT inserted through vocal cords under direct vision,  positive ETCO2 and breath sounds checked- equal and bilateral Secured at: 21 cm Tube secured with: Tape Dental Injury: Teeth and Oropharynx as per pre-operative assessment

## 2017-06-09 NOTE — Anesthesia Postprocedure Evaluation (Signed)
Anesthesia Post Note  Patient: Dawn [name]  Procedure(s) Performed: Procedure(s) (LRB): XI ROBOTIC ASSISTED TOTAL LAPOROSCOPIC HYSTERECTOMY WITH BILATERAL SALPINGO OOPHORECTOMY (N/A) SENTINEL NODE BIOPSY (N/A)     Patient location during evaluation: PACU Anesthesia Type: General Level of consciousness: awake and alert Pain management: pain level controlled Vital Signs Assessment: post-procedure vital signs reviewed and stable Respiratory status: spontaneous breathing, nonlabored ventilation and respiratory function stable Cardiovascular status: blood pressure returned to baseline and stable Postop Assessment: no apparent nausea or vomiting Anesthetic complications: no    Last Vitals:  Vitals:   06/09/17 1045 06/09/17 1058  BP: 120/73 126/68  Pulse: 71 67  Resp: 19 15  Temp:  (!) 36.4 C  SpO2: 97% 99%    Last Pain:  Vitals:   06/09/17 1045  PainSc: Baldwin Patsey Pitstick

## 2017-06-09 NOTE — Interval H&P Note (Signed)
History and Physical Interval Note:  06/09/2017 7:21 AM  Dawn [name]  has presented today for surgery, with the diagnosis of ENDOMETRIAL CANCER  The various methods of treatment have been discussed with the patient and family. After consideration of risks, benefits and other options for treatment, the patient has consented to  Procedure(s): XI ROBOTIC [place] (N/A) SENTINEL NODE BIOPSY (N/A) as a surgical intervention .  The patient's history has been reviewed, patient examined, no change in status, stable for surgery.  I have reviewed the patient's chart and labs.  Questions were answered to the patient's satisfaction.     Donaciano Eva

## 2017-06-09 NOTE — H&P (View-Only) (Signed)
Consult Note: Gyn-Onc  Consult was requested by Dr. Morris for the evaluation of Dawn [name] 66 y.o. female  CC:  Chief Complaint  Patient presents with  . Endometrial cancer ([facility])    Assessment/Plan:  Dawn [name]  is a 66 y.o.  year old with endometrial cancer. She also has a synchronous diagnosis of breast cancer (ER pos, PR neg, Her2-neu pos).  A detailed discussion was held with the patient and her family with regard to to her endometrial cancer diagnosis. We discussed the standard management options for uterine cancer which includes surgery followed possibly by adjuvant therapy depending on the results of surgery. The options for surgical management include a hysterectomy and removal of the tubes and ovaries possibly with removal of pelvic and para-aortic lymph nodes.If feasible, a minimally invasive approach including a robotic hysterectomy or laparoscopic hysterectomy have benefits including shorter hospital stay, recovery time and better wound healing than with open surgery. The patient has been counseled about these surgical options and the risks of surgery in general including infection, bleeding, damage to surrounding structures (including bowel, bladder, ureters, nerves or vessels), and the postoperative risks of PE/ DVT, and lymphedema. I extensively reviewed the additional risks of robotic hysterectomy including possible need for conversion to open laparotomy.  I discussed positioning during surgery of trendelenberg and risks of minor facial swelling and care we take in preoperative positioning.  After counseling and consideration of her options, she desires to proceed with robotic assisted total hysterectomy with bilateral sapingo-oophorectomy and SLN biopsy.   She will be seen by anesthesia for preoperative clearance and discussion of postoperative pain management.  She was given the opportunity to ask questions, which were answered to her satisfaction, and she is  agreement with the above mentioned plan of care.  She is interested in a combined procedure with the breast surgeon, however, the SLN biopsy equipment for breast cancer is exclusively available at [facility] and the SLN biopsy equipment for endometrial cancer is exclusively located at [facility]. Therefore we will have to perform these procedures separately.  However, I do not believe that the recovery from either will impact the timing of these two surgeries. I would want her to delay chemotherapy and radiation until the endometrial cancer surgery is completed.   HPI: Dawn [name] is a 66 year old P2 who is seen in consultation at the request of Dr Morris for endometrial cancer. The patient began having symptoms of bloating and postmenopausal bleeding since mid July, 2018.   Dr Morris performed an US which showed a thickened endometrium. Pap was normal in May, 2018.  On 04/29/17 she underwent D&C which showed endometrioid adenocarcinoma, there was some question as to whether this was endometrial or endocervical in origin.   In the interim the patient underwent routine mammography which revealed a suspicious lesion on the right. This was biopsied and showed invasive ductal carcinoma, grade 2, ER pos, PR neg, Her2 neu pos. She was recommended to have lumpectomy, SLN biopsy and chemo with radiation.  Genetic testing for BRCA was negative.  Current Meds:  Outpatient Encounter Prescriptions as of 05/20/2017  Medication Sig  . acetaminophen (TYLENOL) 325 MG tablet Take 650 mg by mouth every 6 (six) hours as needed for mild pain or moderate pain.  . BENICAR HCT 20-12.5 MG tablet TAKE 1 TABLET BY MOUTH DAILY.  . benzonatate (TESSALON) 200 MG capsule Take 1 capsule (200 mg total) by mouth 3 (three) times daily as needed for   cough.  . Multiple Vitamin (MULTIVITAMIN) capsule Take 1 capsule by mouth daily.    . sodium chloride (MURO 128) 5 % ophthalmic ointment Place 1 application into both  eyes at bedtime.  . vitamin C (ASCORBIC ACID) 500 MG tablet Take 1,000 mg by mouth daily.   . [DISCONTINUED] raloxifene (EVISTA) 60 MG tablet Take 1 tablet by mouth daily.  . ferrous sulfate 325 (65 FE) MG tablet Take 1 tablet (325 mg total) by mouth daily. (Patient not taking: Reported on 05/20/2017)  . fluticasone (FLONASE) 50 MCG/ACT nasal spray Place 1 spray into both nostrils daily. I-2 SPRAYS AS NEEDED. (Patient not taking: Reported on 05/20/2017)  . ibuprofen (ADVIL,MOTRIN) 600 MG tablet Take 1 tablet (600 mg total) by mouth every 6 (six) hours as needed. (Patient not taking: Reported on 05/20/2017)  . meclizine (ANTIVERT) 25 MG tablet Take 1-2 tablets (25-50 mg total) by mouth 3 (three) times daily as needed for dizziness. (Patient not taking: Reported on 05/20/2017)  . ondansetron (ZOFRAN) 4 MG tablet Take 1 tablet (4 mg total) by mouth every 8 (eight) hours as needed for nausea or vomiting. (Patient not taking: Reported on 05/20/2017)  . oxyCODONE-acetaminophen (ROXICET) 5-325 MG tablet Take 1-2 tablets by mouth every 4 (four) hours as needed for severe pain. (Patient not taking: Reported on 05/20/2017)  . Pitavastatin Calcium (LIVALO) 2 MG TABS TAKE 1 & 1/2 TABLETS (3 MG TOTAL) BY MOUTH AT BEDTIME. (Patient not taking: Reported on 05/20/2017)   No facility-administered encounter medications on file as of 05/20/2017.     Allergy:  Allergies  Allergen Reactions  . Crestor [Rosuvastatin Calcium] Other (See Comments)    Myalgias   . Penicillins Rash    Has patient had a PCN reaction causing immediate rash, facial/tongue/throat swelling, SOB or lightheadedness with hypotension: Yes Has patient had a PCN reaction causing severe rash involving mucus membranes or skin necrosis: Yes Has patient had a PCN reaction that required hospitalization: No Has patient had a PCN reaction occurring within the last 10 years: No If all of the above answers are "NO", then may proceed with Cephalosporin use.      Social Hx:   Social History   Social History  . Marital status: Married    Spouse name: N/A  . Number of children: N/A  . Years of education: N/A   Occupational History  .  [employer]    [employer]    Social History Main Topics  . Smoking status: Never Smoker  . Smokeless tobacco: Never Used  . Alcohol use 0.0 oz/week     Comment: <1/week wine or beer  . Drug use: No  . Sexual activity: Not on file   Other Topics Concern  . Not on file   Social History Narrative   2-3 caffeine drinks per day. Regular exercise.  Walking 3 miles a day.     Past Surgical Hx:  Past Surgical History:  Procedure Laterality Date  . BREAST BIOPSY Right 04/27/2017  . COLONOSCOPY    . HYSTEROSCOPY W/D&C N/A 04/29/2017   Procedure: DILATATION AND CURETTAGE /HYSTEROSCOPY;  Surgeon: Morris, Megan, DO;  Location: [facility];  Service: Gynecology;  Laterality: N/A;    Past Medical Hx:  Past Medical History:  Diagnosis Date  . Anemia    as a child.  . Arthritis   . Bilateral cataracts   . Bilateral leg cramps   . Cancer (HCC)    skin  . Dyspnea   .   Family history of bladder cancer   . Family history of colon cancer in father   . Fuchs' corneal dystrophy   . GERD (gastroesophageal reflux disease)   . Hearing loss    Right side 30%  . Heart murmur    childhood  . History of hiatal hernia    small size  . Hyperlipidemia   . Hypertension   . IBS (irritable bowel syndrome)   . Malignant neoplasm of upper-inner quadrant of right female breast (HCC)   . NAFL (nonalcoholic fatty liver)   . Obesity   . Pre-diabetes   . 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 Uterine cancer (HCC)   . Vertigo     Past Gynecological History:  SVD x 2 No LMP recorded. Patient is postmenopausal.  Family Hx:  Family History  Problem Relation Age of Onset  . Colon cancer Father 55  . Heart attack Father 50  . Prostate cancer Father        dx 70's   . Bladder Cancer Father 75  . Stroke Mother 60  . Diabetes Maternal Grandfather   . Kidney disease Maternal Grandfather   . Asthma Brother   . Cancer Paternal Grandmother 45       GYN cancer ( thinks ovarian, maybe uterine)  . Heart attack Maternal Grandmother 70  . Breast cancer Other 70  . Colon cancer Other 70  . Cancer Other        type unk, age dx unk    Review of Systems:  Constitutional  Feels well,    ENT Normal appearing ears and nares bilaterally Skin/Breast  No rash, sores, jaundice, itching, dryness Cardiovascular  No chest pain, shortness of breath, or edema  Pulmonary  No cough or wheeze.  Gastro Intestinal  No nausea, vomitting, or diarrhoea. No bright red blood per rectum, no abdominal pain, change in bowel movement, or constipation.  Genito Urinary  No frequency, urgency, dysuria, + bleeding Musculo Skeletal  No myalgia, arthralgia, joint swelling or pain  Neurologic  No weakness, numbness, change in gait,  Psychology  No depression, anxiety, insomnia.   Vitals:  Blood pressure 130/76, pulse 77, temperature 98.4 F (36.9 C), temperature source Oral, resp. rate 18, height 5' 2.99" (1.6 m), weight 182 lb (82.6 kg), SpO2 100 %.  Physical Exam: WD in NAD Neck  Supple NROM, without any enlargements.  Lymph Node Survey No cervical supraclavicular or inguinal adenopathy Cardiovascular  Pulse normal rate, regularity and rhythm. S1 and S2 normal.  Lungs  Clear to auscultation bilateraly, without wheezes/crackles/rhonchi. Good air movement.  Skin  No rash/lesions/breakdown  Psychiatry  Alert and oriented to person, place, and time  Abdomen  Normoactive bowel sounds, abdomen soft, non-tender and obese without evidence of hernia.  Back No CVA tenderness Genito Urinary  Vulva/vagina: Normal external female genitalia.  No lesions. No discharge or bleeding.  Bladder/urethra:  No lesions or masses, well supported bladder  Vagina: normal  Cervix: Normal  appearing, no lesions.  Uterus:  Small, mobile, no parametrial involvement or nodularity.  Adnexa: no masses. Rectal  Good tone, no masses no cul de sac nodularity.  Extremities  No bilateral cyanosis, clubbing or edema.   Braniyah Besse Caroline, MD  05/20/2017, 11:58 AM     

## 2017-06-09 NOTE — Op Note (Signed)
OPERATIVE NOTE 06/09/17  Surgeon: Donaciano Eva   Assistants: Dr Lahoma Crocker (an MD assistant was necessary for tissue manipulation, management of robotic instrumentation, retraction and positioning due to the complexity of the case and hospital policies).   Anesthesia: General endotracheal anesthesia  ASA Class: 3   Pre-operative Diagnosis: endometrial cancer grade (unknown)  Post-operative Diagnosis: same  Operation: Robotic-assisted laparoscopic total hysterectomy with bilateral salpingoophorectomy, SLN biopsy   Surgeon: Donaciano Eva  Assistant Surgeon: Lahoma Crocker MD  Anesthesia: GET  Urine Output: 300cc  Operative Findings:  : 6cm uterus, normal tubes and ovaries, no suspicious lymph nodes  Estimated Blood Loss:  <25      Total IV Fluids: 800 ml         Specimens: uterus, cervix, bilateral tubes and ovaries, right external iliac SLN, left obturator SLN, left external iliac SLN         Complications:  None; patient tolerated the procedure well.         Disposition: PACU - hemodynamically stable.  Procedure Details  The patient was seen in the Holding Room. The risks, benefits, complications, treatment options, and expected outcomes were discussed with the patient.  The patient concurred with the proposed plan, giving informed consent.  The site of surgery properly noted/marked. The patient was identified as Dawn [name] and the procedure verified as a Robotic-assisted hysterectomy with bilateral salpingo oophorectomy with SLN biopsy. A Time Out was held and the above information confirmed.  After induction of anesthesia, the patient was draped and prepped in the usual sterile manner. Pt was placed in supine position after anesthesia and draped and prepped in the usual sterile manner. The abdominal drape was placed after the CholoraPrep had been allowed to dry for 3 minutes.  Her arms were tucked to her side with all appropriate  precautions.  The shoulders were stabilized with padded shoulder blocks applied to the acromium processes.  The patient was placed in the semi-lithotomy position in [place].  The perineum was prepped with Betadine. The patient was then prepped. Foley catheter was placed.  A sterile speculum was placed in the vagina.  The cervix was grasped with a single-tooth tenaculum. 2mg  total of ICG was injected into the cervical stroma at 2 and 9 o'clock with 1cc injected at a 1cm and 7mm depth (concentration 0.5mg /ml) in all locations. The cervix was dilated with Kennon Rounds dilators.  The ZUMI uterine manipulator with a medium colpotomizer ring was placed without difficulty.  A pneum occluder balloon was placed over the manipulator.  OG tube placement was confirmed and to suction.   Next, a 5 mm skin incision was made 1 cm below the subcostal margin in the midclavicular line.  The 5 mm Optiview port and scope was used for direct entry.  Opening pressure was under 10 mm CO2.  The abdomen was insufflated and the findings were noted as above.   At this point and all points during the procedure, the patient's intra-abdominal pressure did not exceed 15 mmHg. Next, a 10 mm skin incision was made in the umbilicus and a right and left port was placed about 10 cm lateral to the robot port on the right and left side.  A fourth arm was placed in the left lower quadrant 2 cm above and superior and medial to the anterior superior iliac spine.  All ports were placed under direct visualization.  The patient was placed in steep Trendelenburg.  Bowel was folded away into the upper abdomen.  The robot was docked in the normal manner.  The right and left peritoneum were opened parallel to the IP ligament to open the retroperitoneal spaces bilaterally. The SLN mapping was performed in bilateral pelvic basins. The para rectal and paravesical spaces were opened up entirely with careful dissection below the level of the ureters bilaterally  and to the depth of the uterine artery origin in order to skeletonize the uterine "web" and ensure visualization of all parametrial channels. The para-aortic basins were carefully exposed and evaluated for isolated para-aortic SLN's. Lymphatic channels were identified travelling to the following visualized sentinel lymph node's: right external iliac, left external iliac and obturator. These SLN's were separated from their surrounding lymphatic tissue, removed and sent for permanent pathology.  The hysterectomy was started after the round ligament on the right side was incised and the retroperitoneum was entered and the pararectal space was developed.  The ureter was noted to be on the medial leaf of the broad ligament.  The peritoneum above the ureter was incised and stretched and the infundibulopelvic ligament was skeletonized, cauterized and cut.  The posterior peritoneum was taken down to the level of the KOH ring.  The anterior peritoneum was also taken down.  The bladder flap was created to the level of the KOH ring.  The uterine artery on the right side was skeletonized, cauterized and cut in the normal manner.  A similar procedure was performed on the left.  The colpotomy was made and the uterus, cervix, bilateral ovaries and tubes were amputated and delivered through the vagina.  Pedicles were inspected and excellent hemostasis was achieved.    The colpotomy at the vaginal cuff was closed with Vicryl on a CT1 needle in a running manner.  Irrigation was used and excellent hemostasis was achieved.  At this point in the procedure was completed.  Robotic instruments were removed under direct visulaization.  The robot was undocked. The 10 mm ports were closed with Vicryl on a UR-5 needle and the fascia was closed with 0 Vicryl on a UR-5 needle.  The skin was closed with 4-0 Vicryl in a subcuticular manner.  Dermabond was applied.  Sponge, lap and needle counts correct x 2.  The patient was taken to the  recovery room in stable condition.  The vagina was swabbed with  minimal bleeding noted.   All instrument and needle counts were correct x  3.   The patient was transferred to the recovery room in stable condition.  Donaciano Eva, MD

## 2017-06-10 ENCOUNTER — Encounter (HOSPITAL_COMMUNITY): Payer: Self-pay | Admitting: Gynecologic Oncology

## 2017-06-10 DIAGNOSIS — E785 Hyperlipidemia, unspecified: Secondary | ICD-10-CM | POA: Diagnosis not present

## 2017-06-10 DIAGNOSIS — K760 Fatty (change of) liver, not elsewhere classified: Secondary | ICD-10-CM | POA: Diagnosis not present

## 2017-06-10 DIAGNOSIS — C541 Malignant neoplasm of endometrium: Secondary | ICD-10-CM | POA: Diagnosis not present

## 2017-06-10 DIAGNOSIS — Z880 Allergy status to penicillin: Secondary | ICD-10-CM | POA: Diagnosis not present

## 2017-06-10 DIAGNOSIS — I10 Essential (primary) hypertension: Secondary | ICD-10-CM | POA: Diagnosis not present

## 2017-06-10 DIAGNOSIS — Z170 Estrogen receptor positive status [ER+]: Secondary | ICD-10-CM | POA: Diagnosis not present

## 2017-06-10 DIAGNOSIS — C50211 Malignant neoplasm of upper-inner quadrant of right female breast: Secondary | ICD-10-CM | POA: Diagnosis not present

## 2017-06-10 DIAGNOSIS — M1990 Unspecified osteoarthritis, unspecified site: Secondary | ICD-10-CM | POA: Diagnosis not present

## 2017-06-10 DIAGNOSIS — R7303 Prediabetes: Secondary | ICD-10-CM | POA: Diagnosis not present

## 2017-06-10 LAB — BASIC METABOLIC PANEL
ANION GAP: 9 (ref 5–15)
BUN: 12 mg/dL (ref 6–20)
CALCIUM: 8.9 mg/dL (ref 8.9–10.3)
CO2: 25 mmol/L (ref 22–32)
CREATININE: 0.7 mg/dL (ref 0.44–1.00)
Chloride: 102 mmol/L (ref 101–111)
GFR calc Af Amer: >60 mL/min (ref >60)
GLUCOSE: 124 mg/dL — AB (ref 65–99)
Potassium: 3.9 mmol/L (ref 3.5–5.1)
Sodium: 136 mmol/L (ref 135–145)

## 2017-06-10 LAB — CBC
HEMATOCRIT: 35.2 % — AB (ref 36.0–46.0)
Hemoglobin: 11.9 g/dL — ABNORMAL LOW (ref 12.0–15.0)
MCH: 32.8 pg (ref 26.0–34.0)
MCHC: 33.8 g/dL (ref 30.0–36.0)
MCV: 97 fL (ref 78.0–100.0)
PLATELETS: 258 10*3/uL (ref 150–400)
RBC: 3.63 MIL/uL — ABNORMAL LOW (ref 3.87–5.11)
RDW: 12.8 % (ref 11.5–15.5)
WBC: 12.8 10*3/uL — ABNORMAL HIGH (ref 4.0–10.5)

## 2017-06-10 MED ORDER — OXYCODONE-ACETAMINOPHEN 5-325 MG PO TABS: 1.0000 | ORAL_TABLET | ORAL | 0 refills | Status: DC | PRN

## 2017-06-10 MED ORDER — OLMESARTAN MEDOXOMIL-HCTZ 20-12.5 MG PO TABS: 1.0000 | ORAL_TABLET | Freq: Every day | ORAL | 2 refills | Status: DC

## 2017-06-10 MED FILL — OXYCOD/ACETAMINOPHEN 5-325M: 5-325 | 3 days supply | Qty: 30 | Fill #0

## 2017-06-10 NOTE — Discharge Instructions (Signed)
06/09/2017  Return to work: 4 weeks  Activity: 1. Be up and out of the bed during the day.  Take a nap if needed.  You may walk up steps but be careful and use the hand rail.  Stair climbing will tire you more than you think, you may need to stop part way and rest.   2. No lifting or straining for 6 weeks.  3. No driving for 1 weeks.  Do Not drive if you are taking narcotic pain medicine.  4. Shower daily.  Use soap and water on your incision and pat dry; don't rub.   5. No sexual activity and nothing in the vagina for 8 weeks.  Medications:  - Take ibuprofen and tylenol first line for pain control. Take these regularly (every 6 hours) to decrease the build up of pain.  - If necessary, for severe pain not relieved by ibuprofen, take percocet.  - While taking percocet you should take sennakot every night to reduce the likelihood of constipation. If this causes diarrhea, stop its use.  Diet: 1. Low sodium Heart Healthy Diet is recommended.  2. It is safe to use a laxative if you have difficulty moving your bowels.   Wound Care: 1. Keep clean and dry.  Shower daily.  Reasons to call the Doctor:   Fever - Oral temperature greater than 100.4 degrees Fahrenheit  Foul-smelling vaginal discharge  Difficulty urinating  Nausea and vomiting  Increased pain at the site of the incision that is unrelieved with pain medicine.  Difficulty breathing with or without chest pain  New calf pain especially if only on one side  Sudden, continuing increased vaginal bleeding with or without clots.   Follow-up: 1. See Everitt Amber in 3 weeks.  Contacts: For questions or concerns you should contact:  Dr. Everitt Amber at [phone] After hours and on week-ends call [phone] and ask to speak to the physician on call for Gynecologic Oncology  Acetaminophen; Oxycodone tablets What is this medicine? ACETAMINOPHEN; OXYCODONE (a set a MEE noe fen; ox i KOE done) is a pain reliever. It is  used to treat moderate to severe pain. This medicine may be used for other purposes; ask your health care provider or pharmacist if you have questions. COMMON BRAND NAME(S): Endocet, Magnacet, Narvox, Percocet, Perloxx, Primalev, Primlev, Roxicet, Xolox What should I tell my health care provider before I take this medicine? They need to know if you have any of these conditions: -brain tumor -Crohn's disease, inflammatory bowel disease, or ulcerative colitis -drug abuse or addiction -head injury -heart or circulation problems -if you often drink alcohol -kidney disease or problems going to the bathroom -liver disease -lung disease, asthma, or breathing problems -an unusual or allergic reaction to acetaminophen, oxycodone, other opioid analgesics, other medicines, foods, dyes, or preservatives -pregnant or trying to get pregnant -breast-feeding How should I use this medicine? Take this medicine by mouth with a full glass of water. Follow the directions on the prescription label. You can take it with or without food. If it upsets your stomach, take it with food. Take your medicine at regular intervals. Do not take it more often than directed. A special MedGuide will be given to you by the pharmacist with each prescription and refill. Be sure to read this information carefully each time. Talk to your pediatrician regarding the use of this medicine in children. Special care may be needed. Overdosage: If you think you have taken too much of this medicine contact  a poison control center or emergency room at once. NOTE: This medicine is only for you. Do not share this medicine with others. What if I miss a dose? If you miss a dose, take it as soon as you can. If it is almost time for your next dose, take only that dose. Do not take double or extra doses. What may interact with this medicine? This medicine may interact with the following medications: -alcohol -antihistamines for allergy, cough and  cold -antiviral medicines for HIV or AIDS -atropine -certain antibiotics like clarithromycin, erythromycin, linezolid, rifampin -certain medicines for anxiety or sleep -certain medicines for bladder problems like oxybutynin, tolterodine -certain medicines for depression like amitriptyline, fluoxetine, sertraline -certain medicines for fungal infections like ketoconazole, itraconazole, voriconazole -certain medicines for migraine headache like almotriptan, eletriptan, frovatriptan, naratriptan, rizatriptan, sumatriptan, zolmitriptan -certain medicines for nausea or vomiting like dolasetron, ondansetron, palonosetron -certain medicines for Parkinson's disease like benztropine, trihexyphenidyl -certain medicines for seizures like phenobarbital, phenytoin, primidone -certain medicines for stomach problems like dicyclomine, hyoscyamine -certain medicines for travel sickness like scopolamine -diuretics -general anesthetics like halothane, isoflurane, methoxyflurane, propofol -ipratropium -local anesthetics like lidocaine, pramoxine, tetracaine -MAOIs like Carbex, Eldepryl, Marplan, Nardil, and Parnate -medicines that relax muscles for surgery -methylene blue -nilotinib -other medicines with acetaminophen -other narcotic medicines for pain or cough -phenothiazines like chlorpromazine, mesoridazine, prochlorperazine, thioridazine This list may not describe all possible interactions. Give your health care provider a list of all the medicines, herbs, non-prescription drugs, or dietary supplements you use. Also tell them if you smoke, drink alcohol, or use illegal drugs. Some items may interact with your medicine. What should I watch for while using this medicine? Tell your doctor or health care professional if your pain does not go away, if it gets worse, or if you have new or a different type of pain. You may develop tolerance to the medicine. Tolerance means that you will need a higher dose of the  medication for pain relief. Tolerance is normal and is expected if you take this medicine for a long time. Do not suddenly stop taking your medicine because you may develop a severe reaction. Your body becomes used to the medicine. This does NOT mean you are addicted. Addiction is a behavior related to getting and using a drug for a non-medical reason. If you have pain, you have a medical reason to take pain medicine. Your doctor will tell you how much medicine to take. If your doctor wants you to stop the medicine, the dose will be slowly lowered over time to avoid any side effects. There are different types of narcotic medicines (opiates). If you take more than one type at the same time or if you are taking another medicine that also causes drowsiness, you may have more side effects. Give your health care provider a list of all medicines you use. Your doctor will tell you how much medicine to take. Do not take more medicine than directed. Call emergency for help if you have problems breathing or unusual sleepiness. Do not take other medicines that contain acetaminophen with this medicine. Always read labels carefully. If you have questions, ask your doctor or pharmacist. If you take too much acetaminophen get medical help right away. Too much acetaminophen can be very dangerous and cause liver damage. Even if you do not have symptoms, it is important to get help right away. You may get drowsy or dizzy. Do not drive, use machinery, or do anything that needs mental alertness until you know  how this medicine affects you. Do not stand or sit up quickly, especially if you are an older patient. This reduces the risk of dizzy or fainting spells. Alcohol may interfere with the effect of this medicine. Avoid alcoholic drinks. The medicine will cause constipation. Try to have a bowel movement at least every 2 to 3 days. If you do not have a bowel movement for 3 days, call your doctor or health care professional. Your  mouth may get dry. Chewing sugarless gum or sucking hard candy, and drinking plenty or water may help. Contact your doctor if the problem does not go away or is severe. What side effects may I notice from receiving this medicine? Side effects that you should report to your doctor or health care professional as soon as possible: -allergic reactions like skin rash, itching or hives, swelling of the face, lips, or tongue -breathing problems -confusion -redness, blistering, peeling or loosening of the skin, including inside the mouth -signs and symptoms of liver injury like dark yellow or brown urine; general ill feeling or flu-like symptoms; light-colored stools; loss of appetite; nausea; right upper belly pain; unusually weak or tired; yellowing of the eyes or skin -signs and symptoms of low blood pressure like dizziness; feeling faint or lightheaded, falls; unusually weak or tired -trouble passing urine or change in the amount of urine Side effects that usually do not require medical attention (report to your doctor or health care professional if they continue or are bothersome): -constipation -dry mouth -nausea, vomiting -tiredness This list may not describe all possible side effects. Call your doctor for medical advice about side effects. You may report side effects to FDA at 1-800-FDA-1088. Where should I keep my medicine? Keep out of the reach of children. This medicine can be abused. Keep your medicine in a safe place to protect it from theft. Do not share this medicine with anyone. Selling or giving away this medicine is dangerous and against the law. This medicine may cause accidental overdose and death if it taken by other adults, children, or pets. Mix any unused medicine with a substance like cat litter or coffee grounds. Then throw the medicine away in a sealed container like a sealed bag or a coffee can with a lid. Do not use the medicine after the expiration date. Store at room  temperature between 20 and 25 degrees C (68 and 77 degrees F). NOTE: This sheet is a summary. It may not cover all possible information. If you have questions about this medicine, talk to your doctor, pharmacist, or health care provider.  2018 Elsevier/Gold Standard (2015-05-28 21:48:12)

## 2017-06-10 NOTE — Discharge Summary (Signed)
Physician Discharge Summary  Patient ID: Dawn [name] MRN: [id number] DOB/AGE: 1951-04-24 66 y.o.  Admit date: 06/09/2017 Discharge date: 06/10/2017  Admission Diagnoses: Endometrial adenocarcinoma [facility])  Discharge Diagnoses:  Principal Problem:   Endometrial adenocarcinoma [facility]) Active Problems:   Endometrial cancer [facility])   Discharged Condition:  The patient is in good condition and stable for discharge.    Hospital Course: On 06/09/2017, the patient underwent the following: Procedure(s): XI ROBOTIC ASSISTED TOTAL LAPOROSCOPIC HYSTERECTOMY WITH BILATERAL SALPINGO OOPHORECTOMY SENTINEL NODE BIOPSY.  The postoperative course was uneventful.  She was discharged to home on postoperative day 1 tolerating a regular diet, ambulating, voiding.  Consults: None  Significant Diagnostic Studies: None  Treatments: surgery: see above  Discharge Exam: Blood pressure (!) 109/59, pulse 82, temperature 98.5 F (36.9 C), temperature source Axillary, resp. rate 18, height 5\' 3"  (1.6 m), weight 181 lb (82.1 kg), SpO2 98 %. General appearance: alert, cooperative and no distress Resp: clear to auscultation bilaterally Cardio: regular rate and rhythm, S1, S2 normal, no murmur, click, rub or gallop GI: soft, non-tender; bowel sounds normal; no masses,  no organomegaly Extremities: extremities normal, atraumatic, no cyanosis or edema Incision/Wound: Lap sites to the abdomen with dermabond without erythema or drainage  Disposition: Home  Discharge Instructions    Call MD for:  difficulty breathing, headache or visual disturbances    Complete by:  As directed    Call MD for:  extreme fatigue    Complete by:  As directed    Call MD for:  hives    Complete by:  As directed    Call MD for:  persistant dizziness or light-headedness    Complete by:  As directed    Call MD for:  persistant nausea and vomiting    Complete by:  As directed    Call MD for:  redness, tenderness, or signs of infection  (pain, swelling, redness, odor or green/yellow discharge around incision site)    Complete by:  As directed    Call MD for:  severe uncontrolled pain    Complete by:  As directed    Call MD for:  temperature >100.4    Complete by:  As directed    Diet - low sodium heart healthy    Complete by:  As directed    Driving Restrictions    Complete by:  As directed    No driving for 1 week.  Do not take narcotics and drive.   Increase activity slowly    Complete by:  As directed    Lifting restrictions    Complete by:  As directed    No lifting greater than 10 lbs.   Sexual Activity Restrictions    Complete by:  As directed    No sexual activity, nothing in the vagina, for 8 weeks.     Allergies as of 06/10/2017      Reactions   Ciprofloxacin Hives   Crestor [rosuvastatin Calcium] Other (See Comments)   Myalgias   Penicillins Rash   Has patient had a PCN reaction causing immediate rash, facial/tongue/throat swelling, SOB or lightheadedness with hypotension: Yes Has patient had a PCN reaction causing severe rash involving mucus membranes or skin necrosis: Yes Has patient had a PCN reaction that required hospitalization: No Has patient had a PCN reaction occurring within the last 10 years: No If all of the above answers are "NO", then may proceed with Cephalosporin use.      Medication List    TAKE these  medications   acetaminophen 325 MG tablet Commonly known as:  TYLENOL Take 650 mg by mouth every 6 (six) hours as needed for mild pain or moderate pain.   BENICAR HCT 20-12.5 MG tablet Generic drug:  olmesartan-hydrochlorothiazide TAKE 1 TABLET BY MOUTH DAILY. What changed:  how much to take  how to take this  when to take this  additional instructions   calcium carbonate 500 MG chewable tablet Commonly known as:  TUMS - dosed in mg elemental calcium Chew 2 tablets by mouth 2 (two) times daily as needed for indigestion or heartburn.   LIVALO 2 MG Tabs Generic drug:   Pitavastatin Calcium Take 3 mg by mouth every evening. Takes 1.5 tablet   meclizine 25 MG tablet Commonly known as:  ANTIVERT Take 1-2 tablets (25-50 mg total) by mouth 3 (three) times daily as needed for dizziness. What changed:  reasons to take this   multivitamin capsule Take 1 capsule by mouth daily.   ondansetron 4 MG tablet Commonly known as:  ZOFRAN Take 1 tablet (4 mg total) by mouth every 8 (eight) hours as needed for nausea or vomiting.   oxyCODONE-acetaminophen 5-325 MG tablet Commonly known as:  PERCOCET/ROXICET Take 1-2 tablets by mouth every 4 (four) hours as needed (moderate to severe pain).   sodium chloride 5 % ophthalmic ointment Commonly known as:  MURO 914 Place 1 application into both eyes 2 (two) times daily.   vitamin C 1000 MG tablet Take 1,000 mg by mouth daily.            Discharge Care Instructions        Start     Ordered   06/10/17 0000  oxyCODONE-acetaminophen (PERCOCET/ROXICET) 5-325 MG tablet  Every 4 hours PRN     06/10/17 1140   06/10/17 0000  Increase activity slowly     06/10/17 1140   06/10/17 0000  Driving Restrictions    Comments:  No driving for 1 week.  Do not take narcotics and drive.   06/10/17 1140   06/10/17 0000  Lifting restrictions    Comments:  No lifting greater than 10 lbs.   06/10/17 1140   06/10/17 0000  Sexual Activity Restrictions    Comments:  No sexual activity, nothing in the vagina, for 8 weeks.   06/10/17 1140   06/10/17 0000  Diet - low sodium heart healthy     06/10/17 1140   06/10/17 0000  Call MD for:  temperature >100.4     06/10/17 1140   06/10/17 0000  Call MD for:  persistant nausea and vomiting     06/10/17 1140   06/10/17 0000  Call MD for:  severe uncontrolled pain     06/10/17 1140   06/10/17 0000  Call MD for:  redness, tenderness, or signs of infection (pain, swelling, redness, odor or green/yellow discharge around incision site)     06/10/17 1140   06/10/17 0000  Call MD for:   difficulty breathing, headache or visual disturbances     06/10/17 1140   06/10/17 0000  Call MD for:  hives     06/10/17 1140   06/10/17 0000  Call MD for:  persistant dizziness or light-headedness     06/10/17 1140   06/10/17 0000  Call MD for:  extreme fatigue     06/10/17 1140       Greater than thirty minutes were spend for face to face discharge instructions and discharge orders/summary in EPIC.   Signed: CROSS,  MELISSA DEAL 06/10/2017, 11:40 AM

## 2017-06-10 NOTE — Telephone Encounter (Signed)
Changed to generic.Dawn [name]

## 2017-06-10 NOTE — Telephone Encounter (Signed)
Okay to fill his generic. It would probably be best just to go ahead and resend as the generic so that we exchanged and the system each time we refill it.

## 2017-06-10 NOTE — Progress Notes (Signed)
Reviewed AVS and discharge summary w/pt and family. They verbalized understanding and questions were answered to their satisfaction. Pt discharged home in stable condition w/pain controlled, prescription, and all belongings via [facility].

## 2017-06-12 NOTE — Progress Notes (Signed)
[place]  [phone] [phone]  Clinic Follow up Note   Patient Care Team: [name], MD as PCP - Haskell Riling, MD as Consulting Physician (General Surgery) Truitt Merle, MD as Consulting Physician (Hematology) [name], MD as Attending Physician (Radiation Oncology) 06/17/2017  CHIEF COMPLAINTS:  Follow up right breast cancer and endometrial cancer     Malignant neoplasm of upper-inner quadrant of right breast in female, estrogen receptor positive ([place])   04/27/2017 Initial Biopsy    Diagnosis Breast, right, needle core biopsy INVASIVE DUCTAL CARCINOMA, GRADE 2 Microscopic Comment The neoplasm has intracellular mucin and signet ring cell features. Immunostains shows these cells are positive for ER,GATA3, ck7 and GCDFP, negative for ck20, cdx2, TTF-1 and pax8, The immunostaining pattern supports the neoplasm is breast primary.      04/27/2017 Receptors her2    Estrogen Receptor: 70%, POSITIVE, MODERATE STAINING INTENSITY Progesterone Receptor: 0%, NEGATIVE Proliferation Marker Ki67: 30% HER2 - **POSITIVE** RATIO OF HER2/CEP17 SIGNALS 2.91 AVERAGE HER2 COPY NUMBER PER CELL 7.28      04/27/2017 Initial Diagnosis    Malignant neoplasm of upper-inner quadrant of right breast in female, estrogen receptor positive ([place])       Mammogram         05/07/2017 Imaging    CT Chest W Contrast 05/07/17 IMPRESSION: Tiny well-defined fatty lesion on the pleura at the right lung base. The thinner slice collimation used for today's chest CT eliminates volume-averaging seen in the lesion on the prior exam and confirms that this is a diffusely fatty nodule. This is a benign finding and likely represents a tiny lipoma. No defect in the hemidiaphragm evident to suggest tiny diaphragmatic hernia. Pulmonary hamartoma a consideration although the lack of soft tissue components makes this less likely.      05/15/2017 Genetic  Testing    Patient had genetic testing due to a personal history of breast cancer and uterine cancer as well as a family history of cancer. The Multi-Cancer panel was ordered. The Multi-Cancer Panel offered by [facility] includes sequencing and/or deletion duplication testing of the following 83 genes: ALK, APC, ATM, AXIN2,BAP1,  BARD1, BLM, BMPR1A, BRCA1, BRCA2, BRIP1, CASR, CDC73, CDH1, CDK4, CDKN1B, CDKN1C, CDKN2A (p14ARF), CDKN2A (p16INK4a), CEBPA, CHEK2, CTNNA1, DICER1, DIS3L2, EGFR (c.2369C>T, p.Thr790Met variant only), EPCAM (Deletion/duplication testing only), FH, FLCN, GATA2, GPC3, GREM1 (Promoter region deletion/duplication testing only), HOXB13 (c.251G>A, p.Gly84Glu), HRAS, KIT, MAX, MEN1, MET, MITF (c.952G>A, p.Glu318Lys variant only), MLH1, MSH2, MSH3, MSH6, MUTYH, NBN, NF1, NF2, NTHL1, PALB2, PDGFRA, PHOX2B, PMS2, POLD1, POLE, POT1, PRKAR1A, PTCH1, PTEN, RAD50, RAD51C, RAD51D, RB1, RECQL4, RET, RUNX1, SDHAF2, SDHA (sequence changes only), SDHB, SDHC, SDHD, SMAD4, SMARCA4, SMARCB1, SMARCE1, STK11, SUFU, TERC, TERT, TMEM127, TP53, TSC1, TSC2, VHL, WRN and WT1.   Results: No pathogenic mutations were identified. A VUS in the GATA2 gene c.1348G>A (p.Gly450Arg) was identified.  The date of this test report is 05/25/2017.        05/25/2017 Surgery    RIGHT BREAST LUMPECTOMY WITH RADIOACTIVE SEED AND  RIGHT AXILLARY SENTINEL LYMPH NODE BIOPSY and Pot placement by Dr. Excell Seltzer 05/25/17      05/25/2017 Pathology Results    Diagnosis 05/25/17 1. Breast, lumpectomy, right - INVASIVE DUCTAL CARCINOMA, GRADE II/III, SPANNING 2.2 CM. - DUCTAL CARCINOMA IN SITU, HIGH GRADE. - THE SURGICAL RESECTION MARGINS ARE NEGATIVE FOR CARCINOMA. - SEE ONCOLOGY TABLE BELOW. 2. Breast, excision, right additional superior margin - BENIGN FIBROADIPOSE TISSUE. - BENIGN SKELETAL MUSCLE. - SEE COMMENT. 3. Breast,  excision, right additional lateral margin - BENIGN BREAST PARENCHYMA. - THERE IS NO EVIDENCE OF  MALIGNANCY. - SEE COMMENT. 4. Breast, excision, chest wall margin - BENIGN FIBROADIPOSE TISSUE. - THERE IS NO EVIDENCE OF MALIGNANCY. - SEE COMMENT. 5. Lymph node, sentinel, biopsy, right axillary - THERE IS NO EVIDENCE OF CARCINOMA IN 1 OF 1 LYMPH NODE (0/1). 6. Lymph node, sentinel, biopsy, right axillary - THERE IS NO EVIDENCE OF CARCINOMA IN 1 OF 1 LYMPH NODE (0/1). 7. Lymph node, sentinel, biopsy, right axillary - THERE IS NO EVIDENCE OF CARCINOMA IN 1 OF 1 LYMPH NODE (0/1).       Chemotherapy    PENDING Adjuvant CT every 3 weeks for 6 cycles and Herceptin every 3 weeks for 6 months to start 10/17         Endometrial adenocarcinoma ([place])   05/07/2017 Initial Diagnosis    Endometrial adenocarcinoma ([place])     06/09/2017 Surgery    XI ROBOTIC ASSISTED TOTAL LAPOROSCOPIC HYSTERECTOMY WITH BILATERAL SALPINGO OOPHORECTOMY and SENTINEL NODE BIOPSY by Dr. Denman George 06/09/17      06/09/2017 Pathology Results    Diagnosis 06/09/17 1. Lymph node, sentinel, biopsy, right external iliac - METASTATIC ADENOCARCINOMA IN ONE LYMPH NODE (1/1). 2. Lymph node, sentinel, biopsy, left obturator - ONE BENIGN LYMPH NODE (0/1). 3. Lymph node, sentinel, biopsy, left external iliac - METASTATIC ADENOCARCINOMA IN ONE LYMPH NODE (1/1). 4. Uterus +/- tubes/ovaries, neoplastic ENDOMETRIUM: - ENDOMETRIAL ADENOCARCINOMA, 3.2 CM. - CARCINOMA INVADES INNER HALF OF MYOMETRIUM. 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 - CERVIX, BILATERAL FALLOPIAN TUBES AND BILATERAL OVARIES FREE OF TUMOR       HISTORY OF PRESENTING ILLNESS (05/06/2017):  Dawn [name] 66 y.o. female is here because of newly diagnosed breast cancer. Screening mammogram detected right breast mass on 04/20/2017. Ultrasound showed 1.5 cm mass in the 1:00 position. The axilla was negative on ultrasound. Biopsy of the right breast was performed on 04/27/2017 revealing invasive ductal carcinoma, Grade 2, ER 70% / PR 0% / HER-2+ / Ki-67 30%.    Of note, she was also recently diagnosed with endometrial cancer. Biopsy of curettage endometrium on 04/29/2017 revealed adenocarcinoma. Immunohistory shows the tumor is positive with estrogen receptor, progesterone receptor with patchy positivity with carcinoembryonic antigen and p16. The tumor is negative with CD10 and vimentin. The morphology and immunophenotype are not definitive as to endocervical or endometrial origin. There are rare fragments with endometrial type stroma, which may indicate an endometrial origin although this is not definitive as to origin.  The patient presents today in our multidisciplinary breast clinic. She is doing well overall. She went to her PCP with abdominal pain and vaginal bleeding which felt like she was having her period all the time. Then she presented to the ER with severe vertigo and heavy vaginal bleeding. She has been having heavy vaginal bleeding since mid-July. She does not have a history of anemia and associates it with recent bleeding. She has been taking oral iron. She took an aspirin daily but, has not been taking it lately. She had been taking Evista for one year and stopped taking it on her own. She was taking this medication for her bone density. She takes 2-3 Tylenol daily to manage the abdominal pain / cramping discomfort. She believes this pain is associated with passing clots. She takes a multivitamin daily. The patient is apprehensive about chemotherapy and is unsure if she would like to proceed with chemotherapy if it is recommended. She reports leg cramping. She  does not take potassium supplements but, does try to eat bananas. States liver function is typically elevated and has been for about the last 2-3 years.   She has never smoked and doesn't drink alcohol. She did grow up with heavy smoking parents.   Father had colon cancer diagnosed at 21 years-old, and he died of bladder cancer. Paternal grandmother with some type of gynecologic cancer, age  of onset in her mid-54s. Patient believes there was more cancer in her family in aunts and uncles however, there are no more living family members to discuss this with.   GYN HISTORY  Menarchal: 67 LMP: age of 64 Contraceptive: none  HRT: none  G2P2: She was 5 when she gave first live births.   CURRENT THERAPY: PENDING Adjuvant taxol/carboplatin/herceptin every 3 weeks for 6 cycles and Herceptin every 3 weeks for total of 12 months to start 10/17   INTERVAL HISTORY:  DANYAL ADORNO is here for a follow up. She presents to the clinic today s/p lupectomy on 05/25/2017 and total hysterectomy with salpingo-oophorectomy on 06/09/2017. She has been informed of the pathology findings.  She reports to being sad and upset about her diagnosis for her breast cancer and aggressive endometrial cancer.  She is recovering well from her surgeries; minimal pain managed with 1000 mg tylenol q6 PRN. She has no residual problems. Her BM is regular, had one this morning. Her appetite and energy level are both good. She has vertigo and was taking meclizine for this in the past.  She denies nausea and vomiting. She will see Dr. Denman George next week for a follow up.    MEDICAL HISTORY:  Past Medical History:  Diagnosis Date   Anemia    as a child.   Arthritis    Bilateral cataracts    Bilateral leg cramps    Cancer (HCC)    skin   Dyspnea    Family history of bladder cancer    Family history of colon cancer in father    Vira Agar' corneal dystrophy    GERD (gastroesophageal reflux disease)    Hearing loss    Right side 30%   History of hiatal hernia    small size   Hyperlipidemia    Hypertension    IBS (irritable bowel syndrome)    hx of   Malignant neoplasm of upper-inner quadrant of right female breast (HCC)    NAFL (nonalcoholic fatty liver)    Obesity    PONV (postoperative nausea and vomiting)    Tuberculosis    tested positive, mother had when patient was child   Uterine  cancer ([employer])    endometrial cancer   Varices, gastric    Vertigo     SURGICAL HISTORY: Past Surgical History:  Procedure Laterality Date   BREAST BIOPSY Right 04/27/2017   BREAST LUMPECTOMY WITH RADIOACTIVE SEED AND SENTINEL LYMPH NODE BIOPSY Right 05/25/2017   Procedure: RIGHT BREAST LUMPECTOMY WITH RADIOACTIVE SEED AND  RIGHT AXILLARY SENTINEL LYMPH NODE BIOPSY;  Surgeon: Excell Seltzer, MD;  Location: [facility];  Service: General;  Laterality: Right;   COLONOSCOPY     HYSTEROSCOPY W/D&C N/A 04/29/2017   Procedure: DILATATION AND CURETTAGE /HYSTEROSCOPY;  Surgeon: Linda Hedges, DO;  Location: [facility];  Service: Gynecology;  Laterality: N/A;   PORTACATH PLACEMENT Left 05/25/2017   Procedure: INSERTION PORT-A-CATH;  Surgeon: Excell Seltzer, MD;  Location: [facility];  Service: General;  Laterality: Left;   ROBOTIC ASSISTED TOTAL HYSTERECTOMY WITH BILATERAL SALPINGO OOPHERECTOMY  N/A 06/09/2017   Procedure: XI ROBOTIC ASSISTED TOTAL LAPOROSCOPIC HYSTERECTOMY WITH BILATERAL SALPINGO OOPHORECTOMY;  Surgeon: Everitt Amber, MD;  Location: [facility];  Service: Gynecology;  Laterality: N/A;   SENTINEL NODE BIOPSY N/A 06/09/2017   Procedure: SENTINEL NODE BIOPSY;  Surgeon: Everitt Amber, MD;  Location: [facility];  Service: Gynecology;  Laterality: N/A;    SOCIAL HISTORY: Social History   Social History   Marital status: Married    Spouse name: N/A   Number of children: N/A   Years of education: N/A   Occupational History    [employer]    Social History Main Topics   Smoking status: Never Smoker   Smokeless tobacco: Never Used   Alcohol use 0.0 oz/week     Comment: <1/week wine or beer occasional   Drug use: No   Sexual activity: Yes    Birth control/ protection: Post-menopausal   Other Topics Concern   Not on file   Social History Narrative   2-3 caffeine drinks per day. Regular exercise.   Walking 3 miles a day.     FAMILY HISTORY: Family History  Problem Relation Age of Onset   Colon cancer Father 84   Heart attack Father 67   Prostate cancer Father        dx 65's   Bladder Cancer Father 66   Stroke Mother 104   Diabetes Maternal Grandfather    Kidney disease Maternal Grandfather    Asthma Brother    Cancer Paternal Grandmother 6       GYN cancer ( thinks ovarian, maybe uterine)   Heart attack Maternal Grandmother 70   Breast cancer Other 66   Colon cancer Other 22   Cancer Other        type unk, age dx unk    ALLERGIES:  is allergic to ciprofloxacin; crestor [rosuvastatin calcium]; and penicillins.  MEDICATIONS:  Current Outpatient Prescriptions  Medication Sig Dispense Refill   acetaminophen (TYLENOL) 325 MG tablet Take 650 mg by mouth every 6 (six) hours as needed for mild pain or moderate pain.     Ascorbic Acid (VITAMIN C) 1000 MG tablet Take 1,000 mg by mouth daily.     calcium carbonate (TUMS - DOSED IN MG ELEMENTAL CALCIUM) 500 MG chewable tablet Chew 2 tablets by mouth 2 (two) times daily as needed for indigestion or heartburn.     meclizine (ANTIVERT) 25 MG tablet Take 1-2 tablets (25-50 mg total) by mouth 3 (three) times daily as needed for dizziness. (Patient taking differently: Take 25-50 mg by mouth 3 (three) times daily as needed for dizziness (depends on dizziness if takes 1-2 tablets). ) 30 tablet 0   Multiple Vitamin (MULTIVITAMIN) capsule Take 1 capsule by mouth daily.       olmesartan-hydrochlorothiazide (BENICAR HCT) 20-12.5 MG tablet Take 1 tablet by mouth daily. 90 tablet 2   ondansetron (ZOFRAN) 4 MG tablet Take 1 tablet (4 mg total) by mouth every 8 (eight) hours as needed for nausea or vomiting. 12 tablet 0   oxyCODONE-acetaminophen (PERCOCET/ROXICET) 5-325 MG tablet Take 1-2 tablets by mouth every 4 (four) hours as needed (moderate to severe pain). 30 tablet 0   Pitavastatin Calcium (LIVALO) 2 MG TABS Take 3 mg by  mouth every evening. Takes 1.5 tablet     sodium chloride (MURO 128) 5 % ophthalmic ointment Place 1 application into both eyes 2 (two) times daily.      No current facility-administered  medications for this visit.     REVIEW OF SYSTEMS:   Constitutional: Denies fevers, chills or abnormal night sweats Eyes: Denies blurriness of vision, double vision or watery eyes Ears, nose, mouth, throat, and face: Denies mucositis or sore throat Respiratory: Denies cough, dyspnea or wheezes Cardiovascular: Denies palpitation, chest discomfort or lower extremity swelling Gastrointestinal:  Denies nausea, vomiting, constipation, diarrhea, heartburn or change in bowel habits. (+) BMD today Skin: Denies abnormal skin rashes. (+) Surgical incisions healing well, bruising Lymphatics: Denies new lymphadenopathy or easy bruising Musculoskeletal: Negative Neurological:Denies numbness, tingling or new weaknesses (+) vertigo Behavioral/Psych: Mood is stable, no new changes  All other systems were reviewed with the patient and are negative.  PHYSICAL EXAMINATION: ECOG PERFORMANCE STATUS: 1 - Symptomatic but completely ambulatory  Vitals:   06/17/17 0851  BP: 134/79  Pulse: 84  Resp: 18  Temp: 98 F (36.7 C)  SpO2: 96%   Filed Weights   06/17/17 0851  Weight: 189 lb 12.8 oz (86.1 kg)    GENERAL:alert, no distress and comfortable SKIN: skin color, texture, turgor are normal, no rashes or significant lesions EYES: normal, conjunctiva are pink and non-injected, sclera clear OROPHARYNX:no exudate, no erythema and lips, buccal mucosa, and tongue normal  NECK: supple, thyroid normal size, non-tender, without nodularity LYMPH:  no palpable lymphadenopathy in the cervical, axillary or inguinal LUNGS: clear to auscultation with normal breathing effort HEART: regular rate & rhythm and no lower extremity edema (+) heart murmur in the aortic valve ABDOMEN:abdomen soft, non-tender and normal bowel sounds (+)  s/p total hysterectomy and salpingo-oophorectomy, 4 horizontal laparoscopic surgical incisions at the level of the umbilicus, healing well  Musculoskeletal:no cyanosis of digits and no clubbing  PSYCH: alert & oriented x 3 with fluent speech NEURO: no focal motor/sensory deficits BREAST: Left breast shows no palpable mass or adenopathy. Right breast shows no palpable mass or adenopathy.  (+) S/p right lumpectomy, surgical incision in right axilla and around the areola without skin erythema or discharge, some bruising to anterior right breast healing well  LABORATORY DATA:  I have reviewed the data as listed CBC Latest Ref Rng & Units 06/10/2017 06/04/2017 04/28/2017  WBC 4.0 - 10.5 K/uL 12.8(H) 6.8 7.1  Hemoglobin 12.0 - 15.0 g/dL 11.9(L) 13.4 13.5  Hematocrit 36.0 - 46.0 % 35.2(L) 38.5 38.0  Platelets 150 - 400 K/uL 258 259 216    CMP Latest Ref Rng & Units 06/10/2017 06/04/2017 05/07/2017  Glucose 65 - 99 mg/dL 124(H) 127(H) -  BUN 6 - 20 mg/dL 12 12 -  Creatinine 0.44 - 1.00 mg/dL 0.70 0.78 -  Sodium 135 - 145 mmol/L 136 138 -  Potassium 3.5 - 5.1 mmol/L 3.9 3.5 -  Chloride 101 - 111 mmol/L 102 103 -  CO2 22 - 32 mmol/L 25 26 -  Calcium 8.9 - 10.3 mg/dL 8.9 9.3 -  Total Protein 6.0 - 8.5 g/dL - - 7.2  Total Bilirubin 0.0 - 1.2 mg/dL - - 0.3  Alkaline Phos 39 - 117 IU/L - - 46  AST 0 - 40 IU/L - - 81(H)  ALT 0 - 32 IU/L - - 157(H)   PATHOLOGY FINDINGS:   Diagnosis 06/09/17 1. Lymph node, sentinel, biopsy, right external iliac - METASTATIC ADENOCARCINOMA IN ONE LYMPH NODE (1/1). 2. Lymph node, sentinel, biopsy, left obturator - ONE BENIGN LYMPH NODE (0/1). 3. Lymph node, sentinel, biopsy, left external iliac - METASTATIC ADENOCARCINOMA IN ONE LYMPH NODE (1/1). 4. Uterus +/- tubes/ovaries, neoplastic ENDOMETRIUM: - ENDOMETRIAL  ADENOCARCINOMA, 3.2 CM. - CARCINOMA INVADES INNER HALF OF MYOMETRIUM. 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 - CERVIX, BILATERAL FALLOPIAN TUBES AND  BILATERAL OVARIES FREE OF TUMOR. Microscopic Comment 4. ONCOLOGY TABLE-UTERUS, CARCINOMA OR CARCINOSARCOMA Specimen: Uterus with bilateral fallopian tubes and ovaries, right and left external iliac nodes and left obturator lymph node. Procedure: Hysterectomy with bilateral salpingo-oophorectomy and lymph node biopsies. Lymph node sampling performed: Yes. Specimen integrity: Intact. Maximum tumor size: 3.2 cm. Histologic type: Mixed, endometrioid, clear cell and serous. Grade: III. Myometrial invasion: 0.8 cm where myometrium is 2.2 cm in thickness. Cervical stromal involvement: No. Extent of involvement of other organs: N/A. Lymph - vascular invasion: Present. Peritoneal washings: N/A. Lymph nodes: Examined: 3 Sentinel 1 of 3 FINAL for Dawn [name], Dawn [name] ([id number]) Microscopic Comment(continued) 0 Non-sentinel 3 Total Lymph nodes with metastasis: 2. Isolated tumor cells (< 0.2 mm): 0. Micrometastasis: (> 0.2 mm and < 2.0 mm): 0. Macrometastasis: (> 2.0 mm): 2. Extracapsular extension: No. TNM code: pT1a, pN1. FIGO Stage (based on pathologic findings, needs clinical correlation): IIIC1. Comments: The tumor is 3.2 cm in greatest dimension and invades the myometrium to a depth of 0.8 cm where the myometrium is 2.2 cm in thickness and there is lymphatic vascular involvement by tumor. 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 (JDP:ah 06/10/17)   Diagnosis 05/25/17 1. Breast, lumpectomy, right - INVASIVE DUCTAL CARCINOMA, GRADE II/III, SPANNING 2.2 CM. - DUCTAL CARCINOMA IN SITU, HIGH GRADE. - THE SURGICAL RESECTION MARGINS ARE NEGATIVE FOR CARCINOMA. - SEE ONCOLOGY TABLE BELOW. 2. Breast, excision, right additional superior margin - BENIGN FIBROADIPOSE TISSUE. - BENIGN SKELETAL MUSCLE. - SEE COMMENT. 3. Breast, excision, right additional lateral margin - BENIGN BREAST PARENCHYMA. - THERE IS NO EVIDENCE OF MALIGNANCY. - SEE COMMENT. 4. Breast,  excision, chest wall margin - BENIGN FIBROADIPOSE TISSUE. - THERE IS NO EVIDENCE OF MALIGNANCY. - SEE COMMENT. 5. Lymph node, sentinel, biopsy, right axillary - THERE IS NO EVIDENCE OF CARCINOMA IN 1 OF 1 LYMPH NODE (0/1). 6. Lymph node, sentinel, biopsy, right axillary - THERE IS NO EVIDENCE OF CARCINOMA IN 1 OF 1 LYMPH NODE (0/1). 7. Lymph node, sentinel, biopsy, right axillary - THERE IS NO EVIDENCE OF CARCINOMA IN 1 OF 1 LYMPH NODE (0/1). Microscopic Comment 1. BREAST, INVASIVE TUMOR Procedure: Seed localized lumpectomy, additional superior, lateral, and chest wall margin resections and axillary lymph node resections. Laterality: Right. Tumor Size: 2.2 cm (gross measurement). Histologic Type: Ductal. Microscopic Comment(continued) Grade: II. Tubular Differentiation: 2. Nuclear Pleomorphism: 2. Mitotic Count: 2. Ductal Carcinoma in Situ (DCIS): Present, high grade. Extent of Tumor: Confined to breast parenchyma. Margins: Greater than or equal to 0.2 cm to all margins. Regional Lymph Nodes: Number of Lymph Nodes Examined: 3. Number of Sentinel Lymph Nodes Examined: 3. Lymph Nodes with Macrometastases: 0. Lymph Nodes with Micrometastases: 0. Lymph Nodes with Isolated Tumor Cells: 0. Breast Prognostic Profile: Case [id number]. Estrogen Receptor: 70%, moderate. Progesterone Receptor: 0%. Her2: Amplification was detected. The ratio was 2.91. Ki-67: 30%. Best tumor block for sendout testing: 1A-1C. Pathologic Stage Classification (pTNM, AJCC 8th Edition): Primary Tumor (pT): pT2. Regional Lymph Nodes (pN): pN0. Distant Metastases (pM): pMX. 2. - 4. The surgical resection margin(s) of the specimen were inked and microscopically evaluated.    Diagnosis 04/29/2017 Endometrium, curettage - ADENOCARCINOMA. - SEE MICROSCOPIC DESCRIPTION. Microscopic Comment There are fragments of adenocarcinoma, some of which have cribriform architecture, and there is focal  extracellular mucin. Immunohistochemistry shows the tumor is positive with estrogen  receptor, progesterone receptor with patchy positivity with carcinoembryonic antigen and p16. The tumor is negative with CD10 and vimentin. The morphology and immunophenotype are not definitive as to endocervical or endometrial origin. There are rare fragments with endometrial type stroma, which may indicate an endometrial origin although this is not definitive as to origin.  Diagnosis 04/27/2017 Breast, right, needle core biopsy INVASIVE DUCTAL CARCINOMA, GRADE 2 Microscopic Comment The neoplasm has intracellular mucin and signet ring cell features. Immunostains shows these cells are positive for ER, GATA3, ck7 and GCDFP, negative for ck20, cdx2, TTF-1 and pax8, The immunostaining pattern supports the neoplasm is breast primary. The Breast prognostic profile has been ordered. Results: IMMUNOHISTOCHEMICAL AND MORPHOMETRIC ANALYSIS PERFORMED MANUALLY Estrogen Receptor: 70%, POSITIVE, MODERATE STAINING INTENSITY Progesterone Receptor: 0%, NEGATIVE Proliferation Marker Ki67: 30% Results: HER2 - **POSITIVE** RATIO OF HER2/CEP17 SIGNALS 2.91 AVERAGE HER2 COPY NUMBER PER CELL 7.28  GENETICS 05/07/17   PROCEDURES  ECHO 05/07/17 Impressions: - Normal LV size with mild LV hypertrophy. EF 65-70%, vigorous   systolic function. Strain as above. Normal RV size and systolic   function. No significant valvular abnormalities.    RADIOGRAPHIC STUDIES: I have personally reviewed the radiological images as listed and agreed with the findings in the report. Dg Chest Port 1 View  Result Date: 05/25/2017 CLINICAL DATA:  Postop chest EXAM: PORTABLE CHEST 1 VIEW COMPARISON:  CT chest dated 05/07/2017 FINDINGS: Lungs are clear.  No pleural effusion or pneumothorax. The heart is normal in size. Left chest port terminates at the cavoatrial junction. IMPRESSION: Left chest port terminates at the cavoatrial junction.  Electronically Signed   By: Julian Hy M.D.   On: 05/25/2017 13:29   Dg Fluoro Guide Cv Line-no Report  Result Date: 05/25/2017 Fluoroscopy was utilized by the requesting physician.  No radiographic interpretation.    ASSESSMENT & PLAN:  66 y.o. 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 Malignant neoplasm of upper inner quadrant of right breast , Invasive ductal carcinoma, pT2N0M0, G2,  ER+/PR-/HER-2+ -She underwent right breast lumpectomy with sentinel lymph node biopsy with Dr. Excell Seltzer on 05/25/2017 with port placement -Pathology confirms invasive ductal carcinoma, grade 2, spanning 2.2 cm, margins were clear, no positive lymph nodes, ER positive, PR negative, HER-2 positive. I reviewed this with the patient -[name] recommends docetaxel, carboplatin, Herceptin, with or without pejeta as her adjuvant regimen to reduce risk of recurrence after surgery.  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 Denman George and [name] agreed to treatment with Taxol and carbo plus Herceptin every 3 weeks 6 cycles.  Herceptin for a total of 12 months --Chemotherapy consent: Side effects including but does not not limited to, fatigue, nausea, vomiting, diarrhea, hair loss, neuropathy, fluid retention, renal and kidney dysfunction, neutropenic fever, needed for blood transfusion, bleeding, reversible cardiomyopathy were discussed with patient in great detail. She agrees to proceed. The goal of treatment is curative -Giving the strong ER and PR expression in her postmenopausal status, she is a candidate for adjuvant endocrine therapy with aromatase inhibitor for a total of 5-10 years to reduce the risk of cancer recurrence. Potential benefits and side effects were discussed with patient and she is interested.  -I have ordered a pretreatment PET scan to rule out distant metastasis, if insurance does not approve I will  order a bone scan  -She'll attend chemotherapy class prior to treatment -Baseline echo on 05/07/2017 reveals normal LV size with  mild LV hypertrophy. EF 15-40%, vigorous systolic function. Strain as above. Normal RV size and systolic function. No significant valvular abnormalities. This will be monitored every 3 months while on Herceptin -We will plan to start chemotherapy in 2 weeks to allow more time for recovery  -I encouraged her to contact clinic if she develops a fever or unexpected or significant side effects.  -I recommended her to eat healthy diet and get regular exercise after exercise restrictions are lifted by surgery. She has had the flu shot -f/u on 10/17 before first cycle chemo    2. Endometrial adenocarcinoma with lymph node metastasis, pT1ApN1M0, FIGO stage IIIc -She underwent a total hysterectomy with salpingo-oophorectomy  by Dr. Denman George on 06/09/17 -We discussed her surgical pathology. she has high risk features including lymphovascular involvement, nodal positive disease, and mixed histology with endometrioid, clear cell, and serous carcinoma  -We discussed the high risk of cancer recurrence after surgery extensively, and I strongly encouraged her to consider adjuvant chemotherapy to reduce her risk of recurrence. -Drs. Denman George and [place] recommends a pretreatment PET scan followed by adjuvant Taxol plus carboplatin every 3 weeks 6 cycles, which is a standard adjuvant chemotherapy regimen -After chemo she will do cancer surveillance.    3. Genetics -Given her personal history of breast and endometrial cancer, and a family history of malignancy, she will be referred to see a genetic counselor for genetic testing to ruled out cancer syndromes. -Genetics results were negative   4. HTN -She is on HCTZ and I explained that chemo can effect her BP so if her BP drops we will hold her HCTZ.    5. Vertigo  -Symptoms have returned after surgery  -She will restart her Meclizine.     6. Insurance and Emotional support  -referred to our financial support with Annamary Rummage  -She feels saddened by her diagnosis. She declined chaplin and therapy services at this time.  -she has FMLA through November for surgery, she will provide paperwork to extend [facility] through chemotherapy if necessary   7. Elevated LFTs - lFTs are chronically elevated due to fatty liver disease -Will need to monitor her liver functions closely while on chemo.     Follow-up: -Referred to Financial support with Annamary Rummage  -Chemo class 1 week -PET scan 1 week  -Lab, flush, f/u, chemo taxol/carbo/herceptin 10/17, then every 3 weeks x3 -Follow up with Dr. Denman George as scheduled  -Restart meclizine for vertigo     Orders Placed This Encounter  Procedures   NM PET Image Initial (PI) Skull Base To Thigh    Standing Status:   Future    Standing Expiration Date:   06/17/2018    Order Specific Question:   If indicated for the ordered procedure, I authorize the administration of a radiopharmaceutical per Radiology protocol    Answer:   Yes    Order Specific Question:   Preferred imaging location?    Answer:   [facility]    Order Specific Question:   Radiology Contrast Protocol - do NOT remove file path    Answer:   [email or web address]    Order Specific Question:   Reason for Exam additional comments    Answer:   concurrent stage IIA right breast cancer and stage IIIc endometrial cancer pretreatment scan to rule out distant spread   CBC with Differential    Standing Status:   Standing    Number of Occurrences:   50    Standing Expiration Date:   12/17/2018  Comprehensive metabolic panel    Standing Status:   Standing    Number of Occurrences:   50    Standing Expiration Date:   12/17/2018   Hepatitis B surface antibody    Standing Status:   Future    Standing Expiration Date:   06/17/2018   Hepatitis C antibody    Standing Status:   Future    Standing  Expiration Date:   06/17/2018    All questions were answered. The patient knows to call the clinic with any problems, questions or concerns. I spent 35 minutes counseling the patient face to face. The total time spent in the appointment was 45 minutes and more than 50% was on counseling.    Alla Feeling, NP 06/17/2017 12:04 PM  I have seen the patient, examined her. I agree with the assessment and and plan and have edited the notes.   Truitt Merle  06/17/2017

## 2017-06-15 ENCOUNTER — Telehealth: Payer: Self-pay | Admitting: Gynecologic Oncology

## 2017-06-15 NOTE — Telephone Encounter (Signed)
Hi Emma,  Thanks much for keeping me in the loop. Her breast cancer is HER2+, so I usually do carboplatin and docetaxel, plus herceptin for adjuvant chemo. We do use Taxol a lot for breast cancer also, so I think it is very reasonable to give her her carboplatin and Taxol as adjuvant chemotherapy for both breast and endometrial cancer at same time.   I will see her back soon.   Truitt Merle MD

## 2017-06-15 NOTE — Telephone Encounter (Signed)
Stage IIIC endometrial cancer with positive nodes. Aggressive cell type - serous and clear cell.  Recommend adjuvant chemotherapy with carb/tax x 6 cycles and vaginal brachytherapy.  Will discuss with Dr Burr Medico about optimal regimen given that she needs chemotherapy for her breast cancer. I would favor treating the endometrial cancer with its preferable regimen given that it is the more advanced cancer with the highest risk for relapse in this setting.  Would recommend pretreatment imaging (eg PET) to evaluate for other sites of measurable disease.

## 2017-06-17 ENCOUNTER — Encounter: Payer: Self-pay | Admitting: Hematology

## 2017-06-17 ENCOUNTER — Ambulatory Visit (HOSPITAL_BASED_OUTPATIENT_CLINIC_OR_DEPARTMENT_OTHER): Payer: 59 | Admitting: Hematology

## 2017-06-17 ENCOUNTER — Telehealth: Payer: Self-pay

## 2017-06-17 VITALS — BP 134/79 | HR 84 | Temp 98.0°F | Resp 18 | Ht 63.0 in | Wt 189.8 lb

## 2017-06-17 DIAGNOSIS — I10 Essential (primary) hypertension: Secondary | ICD-10-CM | POA: Diagnosis not present

## 2017-06-17 DIAGNOSIS — C799 Secondary malignant neoplasm of unspecified site: Secondary | ICD-10-CM | POA: Diagnosis not present

## 2017-06-17 DIAGNOSIS — Z170 Estrogen receptor positive status [ER+]: Secondary | ICD-10-CM

## 2017-06-17 DIAGNOSIS — C541 Malignant neoplasm of endometrium: Secondary | ICD-10-CM | POA: Diagnosis not present

## 2017-06-17 DIAGNOSIS — R7989 Other specified abnormal findings of blood chemistry: Secondary | ICD-10-CM | POA: Diagnosis not present

## 2017-06-17 DIAGNOSIS — C50211 Malignant neoplasm of upper-inner quadrant of right female breast: Secondary | ICD-10-CM

## 2017-06-17 DIAGNOSIS — R42 Dizziness and giddiness: Secondary | ICD-10-CM

## 2017-06-17 DIAGNOSIS — M810 Age-related osteoporosis without current pathological fracture: Secondary | ICD-10-CM

## 2017-06-17 MED ORDER — PROCHLORPERAZINE MALEATE 10 MG PO TABS: 10.0000 mg | ORAL_TABLET | Freq: Four times a day (QID) | ORAL | 1 refills | Status: DC | PRN

## 2017-06-17 MED ORDER — LIDOCAINE-PRILOCAINE 2.5-2.5 % EX CREA: TOPICAL_CREAM | CUTANEOUS | 3 refills | Status: DC

## 2017-06-17 MED ORDER — ONDANSETRON HCL 8 MG PO TABS: 8.0000 mg | ORAL_TABLET | Freq: Two times a day (BID) | ORAL | 1 refills | Status: DC | PRN

## 2017-06-17 NOTE — Progress Notes (Signed)
Received approval letter from [facility] for Herceptin.  Patient approved for $25,000 06/17/17 - 06/16/18 as long as she remains commercially insured.  Her copay card will cover all but $5 after insurance pays.  Placed in copay book.

## 2017-06-17 NOTE — Progress Notes (Signed)
Received staff message regarding patient and insurance questions.  Called patient to follow up on her concerns. Patient wanted to know how much her insurance would pay for her treatment. Advised patient she may contact her insurance company to find out where she is to date as far as OOP being met. Also advised patient that for the Herceptin as long as she remains commercially insured, she can apply for copay assistance for that drug. Patient agreed to do so. Patient advised that she is currently not on Medicare, which I advised if she does, she will not be eligible for the program. Patient verbalized understanding. Enrolled patient online through [place] assistance for Herceptin. Patient approved. Advised patient that she will receive approval letter and card in the mail for her records and does not need to do anything with it. Went to give patient my card for any additional financial questions or concerns.

## 2017-06-17 NOTE — Telephone Encounter (Signed)
Printed patient avs and calender . Per 10/3 los

## 2017-06-17 NOTE — Progress Notes (Signed)
START ON PATHWAY REGIMEN - Uterine     A cycle is every 21 days:     Paclitaxel      Carboplatin   **Always confirm dose/schedule in your pharmacy ordering system**    Patient Characteristics: Papillary Serous and Clear Cell Histology, First Line, Medically Operable AJCC T Category: T1a AJCC N Category: N1 AJCC M Category: M0 AJCC 8 Stage Grouping: IIIC1 Would you be surprised if this patient died  in the next year<= I would be surprised if this patient died in the next year Line of therapy: First Line Patient Status: Medically Operable Intent of Therapy: Curative Intent, Discussed with Patient

## 2017-06-18 MED FILL — ONDANSETRON HCL 8 MG TABLET: 8 | 15 days supply | Qty: 30 | Fill #0

## 2017-06-18 MED FILL — LIDOCAINE-PRILOCAINE CREAM: 2.5-2.5 | 30 days supply | Qty: 30 | Fill #0 | Status: TO

## 2017-06-18 MED FILL — PROCHLORPERAZINE 10 MG TAB: 10 | 8 days supply | Qty: 30 | Fill #0

## 2017-06-24 ENCOUNTER — Encounter: Payer: Self-pay | Admitting: *Deleted

## 2017-06-26 ENCOUNTER — Encounter (HOSPITAL_COMMUNITY): Payer: Self-pay | Admitting: Radiology

## 2017-06-26 ENCOUNTER — Encounter (HOSPITAL_COMMUNITY)
Admission: RE | Admit: 2017-06-26 | Discharge: 2017-06-26 | Disposition: A | Discharge status: Home / Self Care | Payer: 59 | Source: Ambulatory Visit | Attending: Nurse Practitioner | Admitting: Nurse Practitioner

## 2017-06-26 DIAGNOSIS — C541 Malignant neoplasm of endometrium: Secondary | ICD-10-CM | POA: Diagnosis not present

## 2017-06-26 DIAGNOSIS — Z170 Estrogen receptor positive status [ER+]: Secondary | ICD-10-CM | POA: Insufficient documentation

## 2017-06-26 DIAGNOSIS — C50911 Malignant neoplasm of unspecified site of right female breast: Secondary | ICD-10-CM | POA: Diagnosis not present

## 2017-06-26 DIAGNOSIS — C50211 Malignant neoplasm of upper-inner quadrant of right female breast: Secondary | ICD-10-CM | POA: Diagnosis not present

## 2017-06-26 LAB — GLUCOSE, CAPILLARY: Glucose-Capillary: 105 mg/dL — ABNORMAL HIGH (ref 65–99)

## 2017-06-26 MED ORDER — FLUDEOXYGLUCOSE F - 18 (FDG) INJECTION
9.0600 | Freq: Once | INTRAVENOUS | Status: AC | PRN
  Administered 2017-06-26: 9.06 via INTRAVENOUS

## 2017-06-29 ENCOUNTER — Encounter: Payer: Self-pay | Admitting: Gynecologic Oncology

## 2017-06-29 ENCOUNTER — Ambulatory Visit (INDEPENDENT_AMBULATORY_CARE_PROVIDER_SITE_OTHER): Payer: 59 | Admitting: Family Medicine

## 2017-06-29 ENCOUNTER — Ambulatory Visit: Payer: 59 | Attending: Gynecologic Oncology | Admitting: Gynecologic Oncology

## 2017-06-29 ENCOUNTER — Other Ambulatory Visit: Payer: 59

## 2017-06-29 ENCOUNTER — Telehealth: Payer: Self-pay | Admitting: Family Medicine

## 2017-06-29 VITALS — BP 148/81 | HR 83 | Temp 97.7°F | Resp 20 | Wt 181.4 lb

## 2017-06-29 VITALS — BP 155/82 | HR 83 | Wt 181.0 lb

## 2017-06-29 DIAGNOSIS — Z90722 Acquired absence of ovaries, bilateral: Secondary | ICD-10-CM | POA: Insufficient documentation

## 2017-06-29 DIAGNOSIS — Z90710 Acquired absence of both cervix and uterus: Secondary | ICD-10-CM | POA: Insufficient documentation

## 2017-06-29 DIAGNOSIS — Z880 Allergy status to penicillin: Secondary | ICD-10-CM | POA: Insufficient documentation

## 2017-06-29 DIAGNOSIS — N61 Mastitis without abscess: Secondary | ICD-10-CM

## 2017-06-29 DIAGNOSIS — Z6832 Body mass index (BMI) 32.0-32.9, adult: Secondary | ICD-10-CM | POA: Insufficient documentation

## 2017-06-29 DIAGNOSIS — M1990 Unspecified osteoarthritis, unspecified site: Secondary | ICD-10-CM | POA: Diagnosis not present

## 2017-06-29 DIAGNOSIS — Z79899 Other long term (current) drug therapy: Secondary | ICD-10-CM | POA: Insufficient documentation

## 2017-06-29 DIAGNOSIS — C50211 Malignant neoplasm of upper-inner quadrant of right female breast: Secondary | ICD-10-CM | POA: Diagnosis not present

## 2017-06-29 DIAGNOSIS — Z881 Allergy status to other antibiotic agents status: Secondary | ICD-10-CM | POA: Diagnosis not present

## 2017-06-29 DIAGNOSIS — E785 Hyperlipidemia, unspecified: Secondary | ICD-10-CM | POA: Diagnosis not present

## 2017-06-29 DIAGNOSIS — Z85828 Personal history of other malignant neoplasm of skin: Secondary | ICD-10-CM | POA: Insufficient documentation

## 2017-06-29 DIAGNOSIS — Z8249 Family history of ischemic heart disease and other diseases of the circulatory system: Secondary | ICD-10-CM | POA: Diagnosis not present

## 2017-06-29 DIAGNOSIS — Z8042 Family history of malignant neoplasm of prostate: Secondary | ICD-10-CM | POA: Diagnosis not present

## 2017-06-29 DIAGNOSIS — Z825 Family history of asthma and other chronic lower respiratory diseases: Secondary | ICD-10-CM | POA: Diagnosis not present

## 2017-06-29 DIAGNOSIS — C50919 Malignant neoplasm of unspecified site of unspecified female breast: Secondary | ICD-10-CM | POA: Diagnosis not present

## 2017-06-29 DIAGNOSIS — Z8052 Family history of malignant neoplasm of bladder: Secondary | ICD-10-CM | POA: Diagnosis not present

## 2017-06-29 DIAGNOSIS — Z800 Family history of malignant neoplasm of digestive organs: Secondary | ICD-10-CM | POA: Insufficient documentation

## 2017-06-29 DIAGNOSIS — Z7189 Other specified counseling: Secondary | ICD-10-CM | POA: Diagnosis not present

## 2017-06-29 DIAGNOSIS — E669 Obesity, unspecified: Secondary | ICD-10-CM | POA: Diagnosis not present

## 2017-06-29 DIAGNOSIS — Z888 Allergy status to other drugs, medicaments and biological substances status: Secondary | ICD-10-CM | POA: Insufficient documentation

## 2017-06-29 DIAGNOSIS — I10 Essential (primary) hypertension: Secondary | ICD-10-CM | POA: Insufficient documentation

## 2017-06-29 DIAGNOSIS — C541 Malignant neoplasm of endometrium: Secondary | ICD-10-CM | POA: Diagnosis not present

## 2017-06-29 DIAGNOSIS — K219 Gastro-esophageal reflux disease without esophagitis: Secondary | ICD-10-CM | POA: Diagnosis not present

## 2017-06-29 DIAGNOSIS — H1851 Endothelial corneal dystrophy: Secondary | ICD-10-CM | POA: Diagnosis not present

## 2017-06-29 DIAGNOSIS — K760 Fatty (change of) liver, not elsewhere classified: Secondary | ICD-10-CM | POA: Diagnosis not present

## 2017-06-29 DIAGNOSIS — B3731 Acute candidiasis of vulva and vagina: Secondary | ICD-10-CM

## 2017-06-29 DIAGNOSIS — Z170 Estrogen receptor positive status [ER+]: Secondary | ICD-10-CM | POA: Diagnosis not present

## 2017-06-29 DIAGNOSIS — Z823 Family history of stroke: Secondary | ICD-10-CM | POA: Insufficient documentation

## 2017-06-29 DIAGNOSIS — Z23 Encounter for immunization: Secondary | ICD-10-CM

## 2017-06-29 DIAGNOSIS — B373 Candidiasis of vulva and vagina: Secondary | ICD-10-CM | POA: Diagnosis not present

## 2017-06-29 MED ORDER — CLINDAMYCIN HCL 150 MG PO CAPS
150.0000 mg | ORAL_CAPSULE | Freq: Three times a day (TID) | ORAL | 1 refills | Status: DC
Start: 2017-06-29 — End: 2017-07-22

## 2017-06-29 MED ORDER — NYSTATIN 100000 UNIT/GM EX POWD: Freq: Four times a day (QID) | CUTANEOUS | 0 refills | Status: DC

## 2017-06-29 MED FILL — NYAMYC 100,000 UNITS/GM PWD: 100000 | 20 days supply | Qty: 15 | Fill #0

## 2017-06-29 MED FILL — CLINDAMYCIN HCL 150 MG CAPS: 150 | 7 days supply | Qty: 21 | Fill #0

## 2017-06-29 NOTE — Telephone Encounter (Signed)
Call pt: has she had her flu sshot this year?

## 2017-06-29 NOTE — Progress Notes (Signed)
Agree with above.  Catherine Metheney, MD  

## 2017-06-29 NOTE — Progress Notes (Signed)
Patient walked in requesting a pneumonia vaccine.  She is starting chemotherapy on Wednesday and was advised to get the vaccine. Patient just turned 21 in May.

## 2017-06-29 NOTE — Progress Notes (Signed)
Consult Note: Gyn-Onc  Consult was requested by Dr. Lynnette Caffey for the evaluation of Dawn [name] 66 y.o. female  CC:  Chief Complaint  Patient presents with  . Endometrial cancer [facility])    Assessment/Plan:  Dawn [name]  is a 66 y.o.  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 She also has a synchronous diagnosis of breast cancer (ER pos, PR neg, Her2-neu pos).  I am recommending 6 cycles of adjuvant carb/tax chemotherapy for her endometrial cancer and vaginal brachytherapy (this can be worked in during the chemotherapy).  She has induration and blanching erythema in the right breast superior to the incision. The incision itself is well healed. It is unclear if this is lymphangitis or cellulitis. Given the impending chemotherapy, I am empirically prescribing cleocin and have let the patient know to inform Dr [facility] office for an evaluation.  Vulvovaginal candidiasis - prescribed nystatin powder.  HPI: Dawn [name] is a 66 year old P2 who is seen in consultation at the request of Dr Lynnette Caffey for endometrial cancer. The patient began having symptoms of bloating and postmenopausal bleeding since mid July, 2018.   Dr Lynnette Caffey performed an [name] which showed a thickened endometrium. Pap was normal in May, 2018.  On 04/29/17 she underwent D&C which showed endometrioid adenocarcinoma, there was some question as to whether this was endometrial or endocervical in origin.   In the interim the patient underwent routine mammography which revealed a suspicious lesion on the right. This was biopsied and showed invasive ductal carcinoma, grade 2, ER pos, PR neg, Her2 neu pos. She was recommended to have lumpectomy, SLN biopsy and chemo with radiation.  Genetic testing for BRCA was negative.  Interval Hx:  On 06/09/17 she underwent robotic assisted total hysterectomy, BSO and SLN biopsy. Final pathology revealed stage IIIC1 mixed endometrioid, clear cell and serous  high grade endometrial cancer measuring 3.2cm with 0.8 of 2.2cm myometrial invasion. There were 2 positive external iliac LN's (left and right) with macrometastatic volume disease. The cervix and adnexa were negative.     Current Meds:  Outpatient Encounter Prescriptions as of 06/29/2017  Medication Sig  . acetaminophen (TYLENOL) 325 MG tablet Take 650 mg by mouth every 6 (six) hours as needed for mild pain or moderate pain.  . Ascorbic Acid (VITAMIN C) 1000 MG tablet Take 1,000 mg by mouth daily.  . calcium carbonate (TUMS - DOSED IN MG ELEMENTAL CALCIUM) 500 MG chewable tablet Chew 2 tablets by mouth 2 (two) times daily as needed for indigestion or heartburn.  . lidocaine-prilocaine (EMLA) cream Apply to affected area once  . meclizine (ANTIVERT) 25 MG tablet Take 1-2 tablets (25-50 mg total) by mouth 3 (three) times daily as needed for dizziness. (Patient taking differently: Take 25-50 mg by mouth 3 (three) times daily as needed for dizziness (depends on dizziness if takes 1-2 tablets). )  . Multiple Vitamin (MULTIVITAMIN) capsule Take 1 capsule by mouth daily.    Marland Kitchen olmesartan-hydrochlorothiazide (BENICAR HCT) 20-12.5 MG tablet Take 1 tablet by mouth daily.  . ondansetron (ZOFRAN) 4 MG tablet Take 1 tablet (4 mg total) by mouth every 8 (eight) hours as needed for nausea or vomiting.  . ondansetron (ZOFRAN) 8 MG tablet Take 1 tablet (8 mg total) by mouth 2 (two) times daily as needed for refractory nausea / vomiting. Start on day 3 after chemo.  Marland Kitchen oxyCODONE-acetaminophen (PERCOCET/ROXICET) 5-325 MG tablet Take 1-2 tablets by mouth every 4 (four) hours as needed (  moderate to severe pain).  . Pitavastatin Calcium (LIVALO) 2 MG TABS Take 3 mg by mouth every evening. Takes 1.5 tablet  . prochlorperazine (COMPAZINE) 10 MG tablet Take 1 tablet (10 mg total) by mouth every 6 (six) hours as needed (Nausea or vomiting).  . sodium chloride (MURO 128) 5 % ophthalmic ointment Place 1 application into both  eyes 2 (two) times daily.   . clindamycin (CLEOCIN) 150 MG capsule Take 1 capsule (150 mg total) by mouth 3 (three) times daily.  Marland Kitchen nystatin (MYCOSTATIN/NYSTOP) powder Apply topically 4 (four) times daily.   No facility-administered encounter medications on file as of 06/29/2017.     Allergy:  Allergies  Allergen Reactions  . Ciprofloxacin Hives  . Crestor [Rosuvastatin Calcium] Other (See Comments)    Myalgias   . Penicillins Rash    Has patient had a PCN reaction causing immediate rash, facial/tongue/throat swelling, SOB or lightheadedness with hypotension: Yes Has patient had a PCN reaction causing severe rash involving mucus membranes or skin necrosis: Yes Has patient had a PCN reaction that required hospitalization: No Has patient had a PCN reaction occurring within the last 10 years: No If all of the above answers are "NO", then may proceed with Cephalosporin use.     Social Hx:   Social History   Social History  . Marital status: Married    Spouse name: N/A  . Number of children: N/A  . Years of education: N/A   Occupational History  .  [employer]    [employer]    Social History Main Topics  . Smoking status: Never Smoker  . Smokeless tobacco: Never Used  . Alcohol use 0.0 oz/week     Comment: <1/week wine or beer occasional  . Drug use: No  . Sexual activity: Yes    Birth control/ protection: Post-menopausal   Other Topics Concern  . Not on file   Social History Narrative   2-3 caffeine drinks per day. Regular exercise.  Walking 3 miles a day.     Past Surgical Hx:  Past Surgical History:  Procedure Laterality Date  . BREAST BIOPSY Right 04/27/2017  . BREAST LUMPECTOMY WITH RADIOACTIVE SEED AND SENTINEL LYMPH NODE BIOPSY Right 05/25/2017   Procedure: RIGHT BREAST LUMPECTOMY WITH RADIOACTIVE SEED AND  RIGHT AXILLARY SENTINEL LYMPH NODE BIOPSY;  Surgeon: Glenna Fellows, MD;  Location: [facility];  Service:  General;  Laterality: Right;  . COLONOSCOPY    . HYSTEROSCOPY W/D&C N/A 04/29/2017   Procedure: DILATATION AND CURETTAGE /HYSTEROSCOPY;  Surgeon: Mitchel Honour, DO;  Location: [facility];  Service: Gynecology;  Laterality: N/A;  . PORTACATH PLACEMENT Left 05/25/2017   Procedure: INSERTION PORT-A-CATH;  Surgeon: Glenna Fellows, MD;  Location: [facility];  Service: General;  Laterality: Left;  . ROBOTIC ASSISTED TOTAL HYSTERECTOMY WITH BILATERAL SALPINGO OOPHERECTOMY N/A 06/09/2017   Procedure: XI ROBOTIC ASSISTED TOTAL LAPOROSCOPIC HYSTERECTOMY WITH BILATERAL SALPINGO OOPHORECTOMY;  Surgeon: Adolphus Birchwood, MD;  Location: [facility];  Service: Gynecology;  Laterality: N/A;  . SENTINEL NODE BIOPSY N/A 06/09/2017   Procedure: SENTINEL NODE BIOPSY;  Surgeon: Adolphus Birchwood, MD;  Location: [facility];  Service: Gynecology;  Laterality: N/A;    Past Medical Hx:  Past Medical History:  Diagnosis Date  . Anemia    as a child.  . Arthritis   . Bilateral cataracts   . Bilateral leg cramps   . Cancer (HCC)    skin  . Dyspnea   . Family  history of bladder cancer   . Family history of colon cancer in father   . Fuchs' corneal dystrophy   . GERD (gastroesophageal reflux disease)   . Hearing loss    Right side 30%  . History of hiatal hernia    small size  . Hyperlipidemia   . Hypertension   . IBS (irritable bowel syndrome)    hx of  . Malignant neoplasm of upper-inner quadrant of right female breast ([place])   . NAFL (nonalcoholic fatty liver)   . Obesity   . PONV (postoperative nausea and vomiting)   . 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 Uterine cancer ([place])    endometrial cancer  . Varices, gastric   . Vertigo     Past Gynecological History:  SVD x 2 No LMP recorded. Patient has had a hysterectomy.  Family Hx:  Family History  Problem Relation Age of Onset  . Colon cancer Father 56  . Heart attack Father 37  . Prostate cancer Father        dx  18's  . Bladder Cancer Father 39  . Stroke Mother 22  . Diabetes Maternal Grandfather   . Kidney disease Maternal Grandfather   . Asthma Brother   . Cancer Paternal Grandmother 18       GYN cancer ( thinks ovarian, maybe uterine)  . Heart attack Maternal Grandmother 70  . Breast cancer Other 34  . Colon cancer Other 49  . Cancer Other        type unk, age dx unk    Review of Systems:  Constitutional  Feels well,    ENT Normal appearing ears and nares bilaterally Skin/Breast  Right breast firmness and redness Cardiovascular  No chest pain, shortness of breath, or edema  Pulmonary  No cough or wheeze.  Gastro Intestinal  No nausea, vomitting, or diarrhoea. No bright red blood per rectum, no abdominal pain, change in bowel movement, or constipation.  Genito Urinary  No frequency, urgency, dysuria, no bleeding, + rash in groin Musculo Skeletal  No myalgia, arthralgia, joint swelling or pain  Neurologic  No weakness, numbness, change in gait,  Psychology  No depression, anxiety, insomnia.   Vitals:  Blood pressure (!) 148/81, pulse 83, temperature 97.7 F (36.5 C), temperature source Oral, resp. rate 20, weight 181 lb 6.4 oz (82.3 kg), SpO2 96 %.  Physical Exam: WD in NAD Neck  Supple NROM, without any enlargements.  Lymph Node Survey No cervical supraclavicular or inguinal adenopathy Cardiovascular  Pulse normal rate, regularity and rhythm. S1 and S2 normal.  Lungs  Clear to auscultation bilateraly, without wheezes/crackles/rhonchi. Good air movement.  Skin  + induration in a line on right breast superior to areolar incision (incision itself normal), underlying tissue firm but not fluctuant, overlying skin with blanching erythema. Psychiatry  Alert and oriented to person, place, and time  Abdomen  Normoactive bowel sounds, abdomen soft, non-tender and obese without evidence of hernia. Incisions well healed Back No CVA tenderness Genito Urinary  : vaginal cuff  in tact, no bleeding, no lesions Rectal  Good tone, no masses no cul de sac nodularity.  Extremities  No bilateral cyanosis, clubbing or edema.   30 minutes of direct face to face counseling time was spent with the patient. This included discussion about prognosis, therapy recommendations and postoperative side effects and are beyond the scope of routine postoperative care.   Dawn Eva, MD  06/29/2017, 2:31 PM

## 2017-06-29 NOTE — Patient Instructions (Signed)
Please sprinkle the nystatin powder in your groin creases morning and night until the symptoms of rash subside.  Please take the cleocin tablets 3 times a day for a week.  Notify Dr Hoxsworth's office regarding the need for an appointment to check the breast incision to ensure there is no infection.

## 2017-06-30 NOTE — Telephone Encounter (Signed)
Please update chart

## 2017-06-30 NOTE — Telephone Encounter (Signed)
She had flu shot with Occupational health. 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

## 2017-06-30 NOTE — Telephone Encounter (Signed)
Chart updated

## 2017-07-01 ENCOUNTER — Other Ambulatory Visit (HOSPITAL_BASED_OUTPATIENT_CLINIC_OR_DEPARTMENT_OTHER): Payer: 59

## 2017-07-01 ENCOUNTER — Ambulatory Visit (HOSPITAL_BASED_OUTPATIENT_CLINIC_OR_DEPARTMENT_OTHER): Payer: 59 | Admitting: Nurse Practitioner

## 2017-07-01 ENCOUNTER — Ambulatory Visit (HOSPITAL_BASED_OUTPATIENT_CLINIC_OR_DEPARTMENT_OTHER): Payer: 59

## 2017-07-01 ENCOUNTER — Encounter: Payer: Self-pay | Admitting: *Deleted

## 2017-07-01 ENCOUNTER — Encounter: Payer: Self-pay | Admitting: Nurse Practitioner

## 2017-07-01 ENCOUNTER — Ambulatory Visit: Payer: 59

## 2017-07-01 VITALS — BP 144/74 | HR 84 | Temp 98.0°F | Resp 18 | Ht 63.0 in | Wt 182.2 lb

## 2017-07-01 VITALS — BP 124/82 | HR 75 | Temp 98.2°F | Resp 18

## 2017-07-01 DIAGNOSIS — C50211 Malignant neoplasm of upper-inner quadrant of right female breast: Secondary | ICD-10-CM | POA: Diagnosis not present

## 2017-07-01 DIAGNOSIS — Z5112 Encounter for antineoplastic immunotherapy: Secondary | ICD-10-CM | POA: Diagnosis not present

## 2017-07-01 DIAGNOSIS — R7989 Other specified abnormal findings of blood chemistry: Secondary | ICD-10-CM | POA: Diagnosis not present

## 2017-07-01 DIAGNOSIS — Z170 Estrogen receptor positive status [ER+]: Secondary | ICD-10-CM

## 2017-07-01 DIAGNOSIS — Z5111 Encounter for antineoplastic chemotherapy: Secondary | ICD-10-CM

## 2017-07-01 DIAGNOSIS — C541 Malignant neoplasm of endometrium: Secondary | ICD-10-CM

## 2017-07-01 DIAGNOSIS — I10 Essential (primary) hypertension: Secondary | ICD-10-CM

## 2017-07-01 DIAGNOSIS — C775 Secondary and unspecified malignant neoplasm of intrapelvic lymph nodes: Secondary | ICD-10-CM

## 2017-07-01 LAB — CBC WITH DIFFERENTIAL/PLATELET
BASO%: 0.6 % (ref 0.0–2.0)
BASOS ABS: 0 10*3/uL (ref 0.0–0.1)
EOS ABS: 0.1 10*3/uL (ref 0.0–0.5)
EOS%: 2.8 % (ref 0.0–7.0)
HCT: 39.1 % (ref 34.8–46.6)
HGB: 13.5 g/dL (ref 11.6–15.9)
LYMPH%: 43.3 % (ref 14.0–49.7)
MCH: 33.3 pg (ref 25.1–34.0)
MCHC: 34.5 g/dL (ref 31.5–36.0)
MCV: 96.5 fL (ref 79.5–101.0)
MONO#: 0.4 10*3/uL (ref 0.1–0.9)
MONO%: 9.1 % (ref 0.0–14.0)
NEUT%: 44.2 % (ref 38.4–76.8)
NEUTROS ABS: 2.1 10*3/uL (ref 1.5–6.5)
PLATELETS: 212 10*3/uL (ref 145–400)
RBC: 4.05 10*6/uL (ref 3.70–5.45)
RDW: 12.3 % (ref 11.2–14.5)
WBC: 4.6 10*3/uL (ref 3.9–10.3)
lymph#: 2 10*3/uL (ref 0.9–3.3)

## 2017-07-01 LAB — COMPREHENSIVE METABOLIC PANEL
ALT: 88 U/L — ABNORMAL HIGH (ref 0–55)
AST: 46 U/L — AB (ref 5–34)
Albumin: 3.8 g/dL (ref 3.5–5.0)
Alkaline Phosphatase: 46 U/L (ref 40–150)
Anion Gap: 10 mEq/L (ref 3–11)
BUN: 8.5 mg/dL (ref 7.0–26.0)
CHLORIDE: 103 meq/L (ref 98–109)
CO2: 26 meq/L (ref 22–29)
Calcium: 9.5 mg/dL (ref 8.4–10.4)
Creatinine: 0.8 mg/dL (ref 0.6–1.1)
GLUCOSE: 130 mg/dL (ref 70–140)
POTASSIUM: 3.5 meq/L (ref 3.5–5.1)
SODIUM: 140 meq/L (ref 136–145)
Total Bilirubin: 0.65 mg/dL (ref 0.20–1.20)
Total Protein: 7.1 g/dL (ref 6.4–8.3)

## 2017-07-01 MED ORDER — FAMOTIDINE IN NACL 20-0.9 MG/50ML-% IV SOLN
INTRAVENOUS | Status: AC
  Filled 2017-07-01: qty 50

## 2017-07-01 MED ORDER — HEPARIN SOD (PORK) LOCK FLUSH 100 UNIT/ML IV SOLN
500.0000 [IU] | Freq: Once | INTRAVENOUS | Status: AC | PRN
  Administered 2017-07-01: 500 [IU]
  Filled 2017-07-01: qty 5

## 2017-07-01 MED ORDER — DIPHENHYDRAMINE HCL 50 MG/ML IJ SOLN
50.0000 mg | Freq: Once | INTRAMUSCULAR | Status: AC
  Administered 2017-07-01: 50 mg via INTRAVENOUS

## 2017-07-01 MED ORDER — SODIUM CHLORIDE 0.9 % IV SOLN
175.0000 mg/m2 | Freq: Once | INTRAVENOUS | Status: AC
  Administered 2017-07-01: 342 mg via INTRAVENOUS
  Filled 2017-07-01: qty 57

## 2017-07-01 MED ORDER — PALONOSETRON HCL INJECTION 0.25 MG/5ML
0.2500 mg | Freq: Once | INTRAVENOUS | Status: AC
  Administered 2017-07-01: 0.25 mg via INTRAVENOUS

## 2017-07-01 MED ORDER — SODIUM CHLORIDE 0.9% FLUSH
10.0000 mL | INTRAVENOUS | Status: DC | PRN
  Administered 2017-07-01: 10 mL
  Filled 2017-07-01: qty 10

## 2017-07-01 MED ORDER — PALONOSETRON HCL INJECTION 0.25 MG/5ML
INTRAVENOUS | Status: AC
  Filled 2017-07-01: qty 5

## 2017-07-01 MED ORDER — FAMOTIDINE IN NACL 20-0.9 MG/50ML-% IV SOLN
20.0000 mg | Freq: Once | INTRAVENOUS | Status: AC
  Administered 2017-07-01: 20 mg via INTRAVENOUS

## 2017-07-01 MED ORDER — ACETAMINOPHEN 325 MG PO TABS
ORAL_TABLET | ORAL | Status: AC
  Filled 2017-07-01: qty 2

## 2017-07-01 MED ORDER — CARBOPLATIN CHEMO INJECTION 600 MG/60ML
610.0000 mg | Freq: Once | INTRAVENOUS | Status: AC
  Administered 2017-07-01: 610 mg via INTRAVENOUS
  Filled 2017-07-01: qty 61

## 2017-07-01 MED ORDER — DIPHENHYDRAMINE HCL 25 MG PO CAPS: 50.0000 mg | ORAL_CAPSULE | Freq: Once | ORAL | Status: DC

## 2017-07-01 MED ORDER — DIPHENHYDRAMINE HCL 50 MG/ML IJ SOLN
INTRAMUSCULAR | Status: AC
  Filled 2017-07-01: qty 1

## 2017-07-01 MED ORDER — SODIUM CHLORIDE 0.9 % IV SOLN
20.0000 mg | Freq: Once | INTRAVENOUS | Status: DC
  Filled 2017-07-01: qty 2

## 2017-07-01 MED ORDER — TRASTUZUMAB CHEMO 150 MG IV SOLR
8.0000 mg/kg | Freq: Once | INTRAVENOUS | Status: AC
  Administered 2017-07-01: 693 mg via INTRAVENOUS
  Filled 2017-07-01: qty 33

## 2017-07-01 MED ORDER — PEGFILGRASTIM 6 MG/0.6ML ~~LOC~~ PSKT
6.0000 mg | PREFILLED_SYRINGE | Freq: Once | SUBCUTANEOUS | Status: AC
  Administered 2017-07-01: 6 mg via SUBCUTANEOUS
  Filled 2017-07-01: qty 0.6

## 2017-07-01 MED ORDER — ACETAMINOPHEN 325 MG PO TABS
650.0000 mg | ORAL_TABLET | Freq: Once | ORAL | Status: AC
  Administered 2017-07-01: 650 mg via ORAL

## 2017-07-01 MED ORDER — SODIUM CHLORIDE 0.9 % IV SOLN
Freq: Once | INTRAVENOUS | Status: AC
  Administered 2017-07-01: 15:00:00 via INTRAVENOUS
  Filled 2017-07-01: qty 5

## 2017-07-01 MED ORDER — SODIUM CHLORIDE 0.9 % IV SOLN
Freq: Once | INTRAVENOUS | Status: AC
  Administered 2017-07-01: 12:00:00 via INTRAVENOUS

## 2017-07-01 NOTE — Progress Notes (Signed)
Discharge and neulasta instructions reviewed with patient and verbalized understanding.

## 2017-07-01 NOTE — Patient Instructions (Addendum)
[facility] Discharge Instructions for Patients Receiving Chemotherapy  Today you received the following chemotherapy agents Herceptin,Taxol and Carboplatin  To help prevent nausea and vomiting after your treatment, we encourage you to take your nausea medication as directed If you develop nausea and vomiting that is not controlled by your nausea medication, call the clinic.   BELOW ARE SYMPTOMS THAT SHOULD BE REPORTED IMMEDIATELY:  *FEVER GREATER THAN 100.5 F  *CHILLS WITH OR WITHOUT FEVER  NAUSEA AND VOMITING THAT IS NOT CONTROLLED WITH YOUR NAUSEA MEDICATION  *UNUSUAL SHORTNESS OF BREATH  *UNUSUAL BRUISING OR BLEEDING  TENDERNESS IN MOUTH AND THROAT WITH OR WITHOUT PRESENCE OF ULCERS  *URINARY PROBLEMS  *BOWEL PROBLEMS  UNUSUAL RASH Items with * indicate a potential emergency and should be followed up as soon as possible.  Feel free to call the clinic should you have any questions or concerns. The clinic phone number is [phone].  Please show the [facility] at check-in to the Emergency Department and triage nurse.    Trastuzumab injection for infusion What is this medicine? TRASTUZUMAB (tras TOO zoo mab) is a monoclonal antibody. It is used to treat breast cancer and stomach cancer. This medicine may be used for other purposes; ask your health care provider or pharmacist if you have questions. COMMON BRAND NAME(S): Herceptin What should I tell my health care provider before I take this medicine? They need to know if you have any of these conditions: -heart disease -heart failure -lung or breathing disease, like asthma -an unusual or allergic reaction to trastuzumab, benzyl alcohol, or other medications, foods, dyes, or preservatives -pregnant or trying to get pregnant -breast-feeding How should I use this medicine? This drug is given as an infusion into a vein. It is administered in a hospital or clinic by a specially trained health care  professional. Talk to your pediatrician regarding the use of this medicine in children. This medicine is not approved for use in children. Overdosage: If you think you have taken too much of this medicine contact a poison control center or emergency room at once. NOTE: This medicine is only for you. Do not share this medicine with others. What if I miss a dose? It is important not to miss a dose. Call your doctor or health care professional if you are unable to keep an appointment. What may interact with this medicine? This medicine may interact with the following medications: -certain types of chemotherapy, such as daunorubicin, doxorubicin, epirubicin, and idarubicin This list may not describe all possible interactions. Give your health care provider a list of all the medicines, herbs, non-prescription drugs, or dietary supplements you use. Also tell them if you smoke, drink alcohol, or use illegal drugs. Some items may interact with your medicine. What should I watch for while using this medicine? Visit your doctor for checks on your progress. Report any side effects. Continue your course of treatment even though you feel ill unless your doctor tells you to stop. Call your doctor or health care professional for advice if you get a fever, chills or sore throat, or other symptoms of a cold or flu. Do not treat yourself. Try to avoid being around people who are sick. You may experience fever, chills and shaking during your first infusion. These effects are usually mild and can be treated with other medicines. Report any side effects during the infusion to your health care professional. Fever and chills usually do not happen with later infusions. Do not become  pregnant while taking this medicine or for 7 months after stopping it. Women should inform their doctor if they wish to become pregnant or think they might be pregnant. Women of child-bearing potential will need to have a negative pregnancy test  before starting this medicine. There is a potential for serious side effects to an unborn child. Talk to your health care professional or pharmacist for more information. Do not breast-feed an infant while taking this medicine or for 7 months after stopping it. Women must use effective birth control with this medicine. What side effects may I notice from receiving this medicine? Side effects that you should report to your doctor or health care professional as soon as possible: -allergic reactions like skin rash, itching or hives, swelling of the face, lips, or tongue -chest pain or palpitations -cough -dizziness -feeling faint or lightheaded, falls -fever -general ill feeling or flu-like symptoms -signs of worsening heart failure like breathing problems; swelling in your legs and feet -unusually weak or tired Side effects that usually do not require medical attention (report to your doctor or health care professional if they continue or are bothersome): -bone pain -changes in taste -diarrhea -joint pain -nausea/vomiting -weight loss This list may not describe all possible side effects. Call your doctor for medical advice about side effects. You may report side effects to FDA at 1-800-FDA-1088. Where should I keep my medicine? This drug is given in a hospital or clinic and will not be stored at home. NOTE: This sheet is a summary. It may not cover all possible information. If you have questions about this medicine, talk to your doctor, pharmacist, or health care provider.  2018 Elsevier/Gold Standard (2016-08-26 14:37:52.    Paclitaxel injection What is this medicine? PACLITAXEL (PAK li TAX el) is a chemotherapy drug. It targets fast dividing cells, like cancer cells, and causes these cells to die. This medicine is used to treat ovarian cancer, breast cancer, and other cancers. This medicine may be used for other purposes; ask your health care provider or pharmacist if you have  questions. COMMON BRAND NAME(S): Onxol, Taxol What should I tell my health care provider before I take this medicine? They need to know if you have any of these conditions: -blood disorders -irregular heartbeat -infection (especially a virus infection such as chickenpox, cold sores, or herpes) -liver disease -previous or ongoing radiation therapy -an unusual or allergic reaction to paclitaxel, alcohol, polyoxyethylated castor oil, other chemotherapy agents, other medicines, foods, dyes, or preservatives -pregnant or trying to get pregnant -breast-feeding How should I use this medicine? This drug is given as an infusion into a vein. It is administered in a hospital or clinic by a specially trained health care professional. Talk to your pediatrician regarding the use of this medicine in children. Special care may be needed. Overdosage: If you think you have taken too much of this medicine contact a poison control center or emergency room at once. NOTE: This medicine is only for you. Do not share this medicine with others. What if I miss a dose? It is important not to miss your dose. Call your doctor or health care professional if you are unable to keep an appointment. What may interact with this medicine? Do not take this medicine with any of the following medications: -disulfiram -metronidazole This medicine may also interact with the following medications: -cyclosporine -diazepam -ketoconazole -medicines to increase blood counts like filgrastim, pegfilgrastim, sargramostim -other chemotherapy drugs like cisplatin, doxorubicin, epirubicin, etoposide, teniposide, vincristine -quinidine -testosterone -vaccines -  verapamil Talk to your doctor or health care professional before taking any of these medicines: -acetaminophen -aspirin -ibuprofen -ketoprofen -naproxen This list may not describe all possible interactions. Give your health care provider a list of all the medicines, herbs,  non-prescription drugs, or dietary supplements you use. Also tell them if you smoke, drink alcohol, or use illegal drugs. Some items may interact with your medicine. What should I watch for while using this medicine? Your condition will be monitored carefully while you are receiving this medicine. You will need important blood work done while you are taking this medicine. This medicine can cause serious allergic reactions. To reduce your risk you will need to take other medicine(s) before treatment with this medicine. If you experience allergic reactions like skin rash, itching or hives, swelling of the face, lips, or tongue, tell your doctor or health care professional right away. In some cases, you may be given additional medicines to help with side effects. Follow all directions for their use. This drug may make you feel generally unwell. This is not uncommon, as chemotherapy can affect healthy cells as well as cancer cells. Report any side effects. Continue your course of treatment even though you feel ill unless your doctor tells you to stop. Call your doctor or health care professional for advice if you get a fever, chills or sore throat, or other symptoms of a cold or flu. Do not treat yourself. This drug decreases your body's ability to fight infections. Try to avoid being around people who are sick. This medicine may increase your risk to bruise or bleed. Call your doctor or health care professional if you notice any unusual bleeding. Be careful brushing and flossing your teeth or using a toothpick because you may get an infection or bleed more easily. If you have any dental work done, tell your dentist you are receiving this medicine. Avoid taking products that contain aspirin, acetaminophen, ibuprofen, naproxen, or ketoprofen unless instructed by your doctor. These medicines may hide a fever. Do not become pregnant while taking this medicine. Women should inform their doctor if they wish to  become pregnant or think they might be pregnant. There is a potential for serious side effects to an unborn child. Talk to your health care professional or pharmacist for more information. Do not breast-feed an infant while taking this medicine. Men are advised not to father a child while receiving this medicine. This product may contain alcohol. Ask your pharmacist or healthcare provider if this medicine contains alcohol. Be sure to tell all healthcare providers you are taking this medicine. Certain medicines, like metronidazole and disulfiram, can cause an unpleasant reaction when taken with alcohol. The reaction includes flushing, headache, nausea, vomiting, sweating, and increased thirst. The reaction can last from 30 minutes to several hours. What side effects may I notice from receiving this medicine? Side effects that you should report to your doctor or health care professional as soon as possible: -allergic reactions like skin rash, itching or hives, swelling of the face, lips, or tongue -low blood counts - This drug may decrease the number of white blood cells, red blood cells and platelets. You may be at increased risk for infections and bleeding. -signs of infection - fever or chills, cough, sore throat, pain or difficulty passing urine -signs of decreased platelets or bleeding - bruising, pinpoint red spots on the skin, black, tarry stools, nosebleeds -signs of decreased red blood cells - unusually weak or tired, fainting spells, lightheadedness -breathing problems -chest pain -  high or low blood pressure -mouth sores -nausea and vomiting -pain, swelling, redness or irritation at the injection site -pain, tingling, numbness in the hands or feet -slow or irregular heartbeat -swelling of the ankle, feet, hands Side effects that usually do not require medical attention (report to your doctor or health care professional if they continue or are bothersome): -bone pain -complete hair loss  including hair on your head, underarms, pubic hair, eyebrows, and eyelashes -changes in the color of fingernails -diarrhea -loosening of the fingernails -loss of appetite -muscle or joint pain -red flush to skin -sweating This list may not describe all possible side effects. Call your doctor for medical advice about side effects. You may report side effects to FDA at 1-800-FDA-1088. Where should I keep my medicine? This drug is given in a hospital or clinic and will not be stored at home. NOTE: This sheet is a summary. It may not cover all possible information. If you have questions about this medicine, talk to your doctor, pharmacist, or health care provider.  2018 Elsevier/Gold Standard (2015-07-03 19:58:00)    Carboplatin injection What is this medicine? CARBOPLATIN (KAR boe pla tin) is a chemotherapy drug. It targets fast dividing cells, like cancer cells, and causes these cells to die. This medicine is used to treat ovarian cancer and many other cancers. This medicine may be used for other purposes; ask your health care provider or pharmacist if you have questions. COMMON BRAND NAME(S): Paraplatin What should I tell my health care provider before I take this medicine? They need to know if you have any of these conditions: -blood disorders -hearing problems -kidney disease -recent or ongoing radiation therapy -an unusual or allergic reaction to carboplatin, cisplatin, other chemotherapy, other medicines, foods, dyes, or preservatives -pregnant or trying to get pregnant -breast-feeding How should I use this medicine? This drug is usually given as an infusion into a vein. It is administered in a hospital or clinic by a specially trained health care professional. Talk to your pediatrician regarding the use of this medicine in children. Special care may be needed. Overdosage: If you think you have taken too much of this medicine contact a poison control center or emergency room at  once. NOTE: This medicine is only for you. Do not share this medicine with others. What if I miss a dose? It is important not to miss a dose. Call your doctor or health care professional if you are unable to keep an appointment. What may interact with this medicine? -medicines for seizures -medicines to increase blood counts like filgrastim, pegfilgrastim, sargramostim -some antibiotics like amikacin, gentamicin, neomycin, streptomycin, tobramycin -vaccines Talk to your doctor or health care professional before taking any of these medicines: -acetaminophen -aspirin -ibuprofen -ketoprofen -naproxen This list may not describe all possible interactions. Give your health care provider a list of all the medicines, herbs, non-prescription drugs, or dietary supplements you use. Also tell them if you smoke, drink alcohol, or use illegal drugs. Some items may interact with your medicine. What should I watch for while using this medicine? Your condition will be monitored carefully while you are receiving this medicine. You will need important blood work done while you are taking this medicine. This drug may make you feel generally unwell. This is not uncommon, as chemotherapy can affect healthy cells as well as cancer cells. Report any side effects. Continue your course of treatment even though you feel ill unless your doctor tells you to stop. In some cases, you may be  given additional medicines to help with side effects. Follow all directions for their use. Call your doctor or health care professional for advice if you get a fever, chills or sore throat, or other symptoms of a cold or flu. Do not treat yourself. This drug decreases your body's ability to fight infections. Try to avoid being around people who are sick. This medicine may increase your risk to bruise or bleed. Call your doctor or health care professional if you notice any unusual bleeding. Be careful brushing and flossing your teeth or  using a toothpick because you may get an infection or bleed more easily. If you have any dental work done, tell your dentist you are receiving this medicine. Avoid taking products that contain aspirin, acetaminophen, ibuprofen, naproxen, or ketoprofen unless instructed by your doctor. These medicines may hide a fever. Do not become pregnant while taking this medicine. Women should inform their doctor if they wish to become pregnant or think they might be pregnant. There is a potential for serious side effects to an unborn child. Talk to your health care professional or pharmacist for more information. Do not breast-feed an infant while taking this medicine. What side effects may I notice from receiving this medicine? Side effects that you should report to your doctor or health care professional as soon as possible: -allergic reactions like skin rash, itching or hives, swelling of the face, lips, or tongue -signs of infection - fever or chills, cough, sore throat, pain or difficulty passing urine -signs of decreased platelets or bleeding - bruising, pinpoint red spots on the skin, black, tarry stools, nosebleeds -signs of decreased red blood cells - unusually weak or tired, fainting spells, lightheadedness -breathing problems -changes in hearing -changes in vision -chest pain -high blood pressure -low blood counts - This drug may decrease the number of white blood cells, red blood cells and platelets. You may be at increased risk for infections and bleeding. -nausea and vomiting -pain, swelling, redness or irritation at the injection site -pain, tingling, numbness in the hands or feet -problems with balance, talking, walking -trouble passing urine or change in the amount of urine Side effects that usually do not require medical attention (report to your doctor or health care professional if they continue or are bothersome): -hair loss -loss of appetite -metallic taste in the mouth or changes in  taste This list may not describe all possible side effects. Call your doctor for medical advice about side effects. You may report side effects to FDA at 1-800-FDA-1088. Where should I keep my medicine? This drug is given in a hospital or clinic and will not be stored at home. NOTE: This sheet is a summary. It may not cover all possible information. If you have questions about this medicine, talk to your doctor, pharmacist, or health care provider.  2018 Elsevier/Gold Standard (2007-12-07 14:38:05)

## 2017-07-01 NOTE — Patient Instructions (Signed)
Implanted Port Home Guide An implanted port is a type of central line that is placed under the skin. Central lines are used to provide IV access when treatment or nutrition needs to be given through a person's veins. Implanted ports are used for long-term IV access. An implanted port may be placed because:  You need IV medicine that would be irritating to the small veins in your hands or arms.  You need long-term IV medicines, such as antibiotics.  You need IV nutrition for a long period.  You need frequent blood draws for lab tests.  You need dialysis.  Implanted ports are usually placed in the chest area, but they can also be placed in the upper arm, the abdomen, or the leg. An implanted port has two main parts:  Reservoir. The reservoir is round and will appear as a small, raised area under your skin. The reservoir is the part where a needle is inserted to give medicines or draw blood.  Catheter. The catheter is a thin, flexible tube that extends from the reservoir. The catheter is placed into a large vein. Medicine that is inserted into the reservoir goes into the catheter and then into the vein.  How will I care for my incision site? Do not get the incision site wet. Bathe or shower as directed by your health care provider. How is my port accessed? Special steps must be taken to access the port:  Before the port is accessed, a numbing cream can be placed on the skin. This helps numb the skin over the port site.  Your health care provider uses a sterile technique to access the port. ? Your health care provider must put on a mask and sterile gloves. ? The skin over your port is cleaned carefully with an antiseptic and allowed to dry. ? The port is gently pinched between sterile gloves, and a needle is inserted into the port.  Only "non-coring" port needles should be used to access the port. Once the port is accessed, a blood return should be checked. This helps ensure that the port  is in the vein and is not clogged.  If your port needs to remain accessed for a constant infusion, a clear (transparent) bandage will be placed over the needle site. The bandage and needle will need to be changed every week, or as directed by your health care provider.  Keep the bandage covering the needle clean and dry. Do not get it wet. Follow your health care provider's instructions on how to take a shower or bath while the port is accessed.  If your port does not need to stay accessed, no bandage is needed over the port.  What is flushing? Flushing helps keep the port from getting clogged. Follow your health care provider's instructions on how and when to flush the port. Ports are usually flushed with saline solution or a medicine called heparin. The need for flushing will depend on how the port is used.  If the port is used for intermittent medicines or blood draws, the port will need to be flushed: ? After medicines have been given. ? After blood has been drawn. ? As part of routine maintenance.  If a constant infusion is running, the port may not need to be flushed.  How long will my port stay implanted? The port can stay in for as long as your health care provider thinks it is needed. When it is time for the port to come out, surgery will be   done to remove it. The procedure is similar to the one performed when the port was put in. When should I seek immediate medical care? When you have an implanted port, you should seek immediate medical care if:  You notice a bad smell coming from the incision site.  You have swelling, redness, or drainage at the incision site.  You have more swelling or pain at the port site or the surrounding area.  You have a fever that is not controlled with medicine.  This information is not intended to replace advice given to you by your health care provider. Make sure you discuss any questions you have with your health care provider. Document  Released: 09/01/2005 Document Revised: 02/07/2016 Document Reviewed: 05/09/2013 Elsevier Interactive Patient Education  2017 [employer]  

## 2017-07-01 NOTE — Progress Notes (Signed)
[facility]  [phone] [phone]  Clinic Follow up Note   Patient Care Team: [name], MD as PCP - Haskell Riling, MD as Consulting Physician (General Surgery) Truitt Merle, MD as Consulting Physician (Hematology) [name], MD as Attending Physician (Radiation Oncology) 07/01/2017  SUMMARY OF ONCOLOGIC HISTORY:   Malignant neoplasm of upper-inner quadrant of right breast in female, estrogen receptor positive ([place])   04/27/2017 Initial Biopsy    Diagnosis Breast, right, needle core biopsy INVASIVE DUCTAL CARCINOMA, GRADE 2 Microscopic Comment The neoplasm has intracellular mucin and signet ring cell features. Immunostains shows these cells are positive for ER,GATA3, ck7 and GCDFP, negative for ck20, cdx2, TTF-1 and pax8, The immunostaining pattern supports the neoplasm is breast primary.      04/27/2017 Receptors her2    Estrogen Receptor: 70%, POSITIVE, MODERATE STAINING INTENSITY Progesterone Receptor: 0%, NEGATIVE Proliferation Marker Ki67: 30% HER2 - **POSITIVE** RATIO OF HER2/CEP17 SIGNALS 2.91 AVERAGE HER2 COPY NUMBER PER CELL 7.28      04/27/2017 Initial Diagnosis    Malignant neoplasm of upper-inner quadrant of right breast in female, estrogen receptor positive ([place])       Mammogram         05/07/2017 Imaging    CT Chest W Contrast 05/07/17 IMPRESSION: Tiny well-defined fatty lesion on the pleura at the right lung base. The thinner slice collimation used for today's chest CT eliminates volume-averaging seen in the lesion on the prior exam and confirms that this is a diffusely fatty nodule. This is a benign finding and likely represents a tiny lipoma. No defect in the hemidiaphragm evident to suggest tiny diaphragmatic hernia. Pulmonary hamartoma a consideration although the lack of soft tissue components makes this less likely.      05/15/2017 Genetic Testing    Patient had genetic testing due  to a personal history of breast cancer and uterine cancer as well as a family history of cancer. The Multi-Cancer panel was ordered. The Multi-Cancer Panel offered by [facility] includes sequencing and/or deletion duplication testing of the following 83 genes: ALK, APC, ATM, AXIN2,BAP1,  BARD1, BLM, BMPR1A, BRCA1, BRCA2, BRIP1, CASR, CDC73, CDH1, CDK4, CDKN1B, CDKN1C, CDKN2A (p14ARF), CDKN2A (p16INK4a), CEBPA, CHEK2, CTNNA1, DICER1, DIS3L2, EGFR (c.2369C>T, p.Thr790Met variant only), EPCAM (Deletion/duplication testing only), FH, FLCN, GATA2, GPC3, GREM1 (Promoter region deletion/duplication testing only), HOXB13 (c.251G>A, p.Gly84Glu), HRAS, KIT, MAX, MEN1, MET, MITF (c.952G>A, p.Glu318Lys variant only), MLH1, MSH2, MSH3, MSH6, MUTYH, NBN, NF1, NF2, NTHL1, PALB2, PDGFRA, PHOX2B, PMS2, POLD1, POLE, POT1, PRKAR1A, PTCH1, PTEN, RAD50, RAD51C, RAD51D, RB1, RECQL4, RET, RUNX1, SDHAF2, SDHA (sequence changes only), SDHB, SDHC, SDHD, SMAD4, SMARCA4, SMARCB1, SMARCE1, STK11, SUFU, TERC, TERT, TMEM127, TP53, TSC1, TSC2, VHL, WRN and WT1.   Results: No pathogenic mutations were identified. A VUS in the GATA2 gene c.1348G>A (p.Gly450Arg) was identified.  The date of this test report is 05/25/2017.        05/25/2017 Surgery    RIGHT BREAST LUMPECTOMY WITH RADIOACTIVE SEED AND  RIGHT AXILLARY SENTINEL LYMPH NODE BIOPSY and Pot placement by Dr. Excell Seltzer 05/25/17      05/25/2017 Pathology Results    Diagnosis 05/25/17 1. Breast, lumpectomy, right - INVASIVE DUCTAL CARCINOMA, GRADE II/III, SPANNING 2.2 CM. - DUCTAL CARCINOMA IN SITU, HIGH GRADE. - THE SURGICAL RESECTION MARGINS ARE NEGATIVE FOR CARCINOMA. - SEE ONCOLOGY TABLE BELOW. 2. Breast, excision, right additional superior margin - BENIGN FIBROADIPOSE TISSUE. - BENIGN SKELETAL MUSCLE. - SEE COMMENT. 3. Breast, excision, right additional lateral margin - BENIGN BREAST PARENCHYMA. -  THERE IS NO EVIDENCE OF MALIGNANCY. - SEE COMMENT. 4. Breast, excision,  chest wall margin - BENIGN FIBROADIPOSE TISSUE. - THERE IS NO EVIDENCE OF MALIGNANCY. - SEE COMMENT. 5. Lymph node, sentinel, biopsy, right axillary - THERE IS NO EVIDENCE OF CARCINOMA IN 1 OF 1 LYMPH NODE (0/1). 6. Lymph node, sentinel, biopsy, right axillary - THERE IS NO EVIDENCE OF CARCINOMA IN 1 OF 1 LYMPH NODE (0/1). 7. Lymph node, sentinel, biopsy, right axillary - THERE IS NO EVIDENCE OF CARCINOMA IN 1 OF 1 LYMPH NODE (0/1).       Chemotherapy    PENDING Adjuvant CT every 3 weeks for 6 cycles and Herceptin every 3 weeks for 6 months to start 10/17         Endometrial adenocarcinoma ([place])   05/07/2017 Initial Diagnosis    Endometrial adenocarcinoma ([place])     06/09/2017 Surgery    XI ROBOTIC ASSISTED TOTAL LAPOROSCOPIC HYSTERECTOMY WITH BILATERAL SALPINGO OOPHORECTOMY and SENTINEL NODE BIOPSY by Dr. Denman George 06/09/17      06/09/2017 Pathology Results    Diagnosis 06/09/17 1. Lymph node, sentinel, biopsy, right external iliac - METASTATIC ADENOCARCINOMA IN ONE LYMPH NODE (1/1). 2. Lymph node, sentinel, biopsy, left obturator - ONE BENIGN LYMPH NODE (0/1). 3. Lymph node, sentinel, biopsy, left external iliac - METASTATIC ADENOCARCINOMA IN ONE LYMPH NODE (1/1). 4. Uterus +/- tubes/ovaries, neoplastic ENDOMETRIUM: - ENDOMETRIAL ADENOCARCINOMA, 3.2 CM. - CARCINOMA INVADES INNER HALF OF MYOMETRIUM. 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 - CERVIX, BILATERAL FALLOPIAN TUBES AND BILATERAL OVARIES FREE OF TUMOR      CURRENT THERAPY: Adjuvant taxol/carboplatin/herceptin every 3 weeks for 6 cycles and Herceptin every 3 weeks for total of 12 months beginning 06/1717.   INTERVAL HISTORY: Dawn [name] returns with her husband for follow up as scheduled prior to cycle 1 TCH. She has attended chemo class and has picked up anti-emetics. She is up to date on vaccines. She feels well overall, has recovered well from lumpectomy on 05/25/17 and hysterectomy with salpingo-oophorectomy on  06/09/17. Her energy level and appetite are good. She had f/u with Dr. Denman George this week on 06/29/17; she was started on empiric cleocin x7 days for possible infection in the right breast, she has taken 4 doses. She denies fever, chills, pain, drainage, swelling at surgical site or upper extremity lymphedema.   REVIEW OF SYSTEMS:   Constitutional: Denies fevers, chills or abnormal weight loss Eyes: Denies blurriness of vision Ears, nose, mouth, throat, and face: Denies mucositis or sore throat Respiratory: Denies cough, dyspnea or wheezes Cardiovascular: Denies palpitation, chest discomfort or lower extremity swelling Gastrointestinal:  Denies nausea, vomiting, constipation, diarrhea, heartburn or change in bowel habits Skin: Denies abnormal skin rashes. (+) right breast red and firm, no pain Lymphatics: Denies new lymphadenopathy or easy bruising Neurological:Denies numbness, tingling or new weaknesses Behavioral/Psych: Mood is stable, no new changes  All other systems were reviewed with the patient and are negative.  MEDICAL HISTORY:  Past Medical History:  Diagnosis Date  . Anemia    as a child.  . Arthritis   . Bilateral cataracts   . Bilateral leg cramps   . Cancer ([place])    skin  . Dyspnea   . Family history of bladder cancer   . Family history of colon cancer in father   . Fuchs' corneal dystrophy   . GERD (gastroesophageal reflux disease)   . Hearing loss    Right side 30%  . History of hiatal hernia    small size  .  Hyperlipidemia   . Hypertension   . IBS (irritable bowel syndrome)    hx of  . Malignant neoplasm of upper-inner quadrant of right female breast ([place])   . NAFL (nonalcoholic fatty liver)   . Obesity   . PONV (postoperative nausea and vomiting)   . 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 Uterine cancer ([place])    endometrial cancer  . Varices, gastric   . Vertigo     SURGICAL HISTORY: Past Surgical History:  Procedure  Laterality Date  . BREAST BIOPSY Right 04/27/2017  . BREAST LUMPECTOMY WITH RADIOACTIVE SEED AND SENTINEL LYMPH NODE BIOPSY Right 05/25/2017   Procedure: RIGHT BREAST LUMPECTOMY WITH RADIOACTIVE SEED AND  RIGHT AXILLARY SENTINEL LYMPH NODE BIOPSY;  Surgeon: Excell Seltzer, MD;  Location: [facility];  Service: General;  Laterality: Right;  . COLONOSCOPY    . HYSTEROSCOPY W/D&C N/A 04/29/2017   Procedure: DILATATION AND CURETTAGE /HYSTEROSCOPY;  Surgeon: Linda Hedges, DO;  Location: [facility];  Service: Gynecology;  Laterality: N/A;  . PORTACATH PLACEMENT Left 05/25/2017   Procedure: INSERTION PORT-A-CATH;  Surgeon: Excell Seltzer, MD;  Location: [facility];  Service: General;  Laterality: Left;  . ROBOTIC ASSISTED TOTAL HYSTERECTOMY WITH BILATERAL SALPINGO OOPHERECTOMY N/A 06/09/2017   Procedure: XI ROBOTIC ASSISTED TOTAL LAPOROSCOPIC HYSTERECTOMY WITH BILATERAL SALPINGO OOPHORECTOMY;  Surgeon: Everitt Amber, MD;  Location: [facility];  Service: Gynecology;  Laterality: N/A;  . SENTINEL NODE BIOPSY N/A 06/09/2017   Procedure: SENTINEL NODE BIOPSY;  Surgeon: Everitt Amber, MD;  Location: [facility];  Service: Gynecology;  Laterality: N/A;    I have reviewed the social history and family history with the patient and they are unchanged from previous note.  ALLERGIES:  is allergic to ciprofloxacin; crestor [rosuvastatin calcium]; and penicillins.  MEDICATIONS:  Current Outpatient Prescriptions  Medication Sig Dispense Refill  . acetaminophen (TYLENOL) 325 MG tablet Take 650 mg by mouth every 6 (six) hours as needed for mild pain or moderate pain.    . Ascorbic Acid (VITAMIN C) 1000 MG tablet Take 1,000 mg by mouth daily.    . calcium carbonate (TUMS - DOSED IN MG ELEMENTAL CALCIUM) 500 MG chewable tablet Chew 2 tablets by mouth 2 (two) times daily as needed for indigestion or heartburn.    . clindamycin (CLEOCIN) 150 MG capsule Take 1 capsule (150 mg total) by mouth 3 (three)  times daily. 21 capsule 1  . lidocaine-prilocaine (EMLA) cream Apply to affected area once 30 g 3  . Multiple Vitamin (MULTIVITAMIN) capsule Take 1 capsule by mouth daily.      Marland Kitchen nystatin (MYCOSTATIN/NYSTOP) powder Apply topically 4 (four) times daily. 15 g 0  . olmesartan-hydrochlorothiazide (BENICAR HCT) 20-12.5 MG tablet Take 1 tablet by mouth daily. 90 tablet 2  . ondansetron (ZOFRAN) 8 MG tablet Take 1 tablet (8 mg total) by mouth 2 (two) times daily as needed for refractory nausea / vomiting. Start on day 3 after chemo. 30 tablet 1  . oxyCODONE-acetaminophen (PERCOCET/ROXICET) 5-325 MG tablet Take 1-2 tablets by mouth every 4 (four) hours as needed (moderate to severe pain). 30 tablet 0  . Pitavastatin Calcium (LIVALO) 2 MG TABS Take 3 mg by mouth every evening. Takes 1.5 tablet    . prochlorperazine (COMPAZINE) 10 MG tablet Take 1 tablet (10 mg total) by mouth every 6 (six) hours as needed (Nausea or vomiting). 30 tablet 1  . meclizine (ANTIVERT) 25 MG tablet Take 1-2 tablets (25-50 mg  total) by mouth 3 (three) times daily as needed for dizziness. (Patient taking differently: Take 25-50 mg by mouth 3 (three) times daily as needed for dizziness (depends on dizziness if takes 1-2 tablets). ) 30 tablet 0  . ondansetron (ZOFRAN) 4 MG tablet Take 1 tablet (4 mg total) by mouth every 8 (eight) hours as needed for nausea or vomiting. 12 tablet 0  . sodium chloride (MURO 128) 5 % ophthalmic ointment Place 1 application into both eyes 2 (two) times daily.      No current facility-administered medications for this visit.    Facility-Administered Medications Ordered in Other Visits  Medication Dose Route Frequency Provider Last Rate Last Dose  . CARBOplatin (PARAPLATIN) 610 mg in sodium chloride 0.9 % 250 mL chemo infusion  610 mg Intravenous Once Truitt Merle, MD      . famotidine (PEPCID) IVPB 20 mg premix  20 mg Intravenous Once Truitt Merle, MD      . fosaprepitant (EMEND) 150 mg, dexamethasone  (DECADRON) 12 mg in sodium chloride 0.9 % 145 mL IVPB   Intravenous Once Truitt Merle, MD      . heparin lock flush 100 unit/mL  500 Units Intracatheter Once PRN Truitt Merle, MD      . PACLitaxel (TAXOL) 342 mg in sodium chloride 0.9 % 500 mL chemo infusion (> 82m/m2)  175 mg/m2 (Treatment Plan Recorded) Intravenous Once FTruitt Merle MD      . pegfilgrastim (NEULASTA ONPRO KIT) injection 6 mg  6 mg Subcutaneous Once BCira RueK, NP      . sodium chloride flush (NS) 0.9 % injection 10 mL  10 mL Intracatheter PRN FTruitt Merle MD      . trastuzumab (HERCEPTIN) 693 mg in sodium chloride 0.9 % 250 mL chemo infusion  8 mg/kg (Treatment Plan Recorded) Intravenous Once FTruitt Merle MD 188.7 mL/hr at 07/01/17 1313 693 mg at 07/01/17 1313    PHYSICAL EXAMINATION: ECOG PERFORMANCE STATUS: 0 - Asymptomatic  Vitals:   07/01/17 1015  BP: (!) 144/74  Pulse: 84  Resp: 18  Temp: 98 F (36.7 C)  SpO2: 97%   Filed Weights   07/01/17 1015  Weight: 182 lb 3.2 oz (82.6 kg)    GENERAL:alert, no distress and comfortable SKIN: skin color, texture, turgor are normal, no rashes or significant lesions EYES: normal, Conjunctiva are pink and non-injected, sclera clear OROPHARYNX:no exudate, no erythema and lips, buccal mucosa, and tongue normal  NECK: supple, thyroid normal size, non-tender, without nodularity LYMPH:  no palpable cervical, supraclavicular, axillary, or inguinal lymphadenopathy  LUNGS: clear to auscultation bilaterally with normal breathing effort HEART: regular rate & rhythm, S1 and S2 normal, no murmurs and no lower extremity edema ABDOMEN:abdomen soft, non-tender and normal bowel sounds. No palpable hepatomegaly or masses. Musculoskeletal:no cyanosis of digits and no clubbing  NEURO: alert & oriented x 3 with fluent speech, no focal motor/sensory deficits Breasts: inspection shows them to be symmetrical with no nipple discharge. Left breast benign. Right breast: 3 x 3.5 cm area of skin blanching  erythema and induration superior to areolar incision, tissue is firm but not fluctuant. No drainage or tenderness.   LABORATORY DATA:  I have reviewed the data as listed CBC Latest Ref Rng & Units 07/01/2017 06/10/2017 06/04/2017  WBC 3.9 - 10.3 10e3/uL 4.6 12.8(H) 6.8  Hemoglobin 11.6 - 15.9 g/dL 13.5 11.9(L) 13.4  Hematocrit 34.8 - 46.6 % 39.1 35.2(L) 38.5  Platelets 145 - 400 10e3/uL 212 258 259    CMP  Latest Ref Rng & Units 07/01/2017 06/10/2017 06/04/2017  Glucose 70 - 140 mg/dl 130 124(H) 127(H)  BUN 7.0 - 26.0 mg/dL 8._0 Creatinine 0.6 - 1.1 mg/dL 0.8 0.70 0.78  Sodium 136 - 145 mEq/L 140 136 138  Potassium 3.5 - 5.1 mEq/L 3.5 3.9 3.5  Chloride 101 - 111 mmol/L - 102 103  CO2 22 - 29 mEq/L _1 Calcium 8.4 - 10.4 mg/dL 9.5 8.9 9.3  Total Protein 6.4 - 8.3 g/dL 7.1 - -  Total Bilirubin 0.20 - 1.20 mg/dL 0.65 - -  Alkaline Phos 40 - 150 U/L 46 - -  AST 5 - 34 U/L 46(H) - -  ALT 0 - 55 U/L 88(H) - -    RADIOGRAPHIC STUDIES: I have personally reviewed the radiological images as listed and agreed with the findings in the report. No results found.   ASSESSMENT & PLAN: 66 y.o. 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 Malignant neoplasm of upper inner quadrant of right breast , Invasive ductal carcinoma, pT2N0M0, G2,  ER+/PR-/HER-2+ 2. Endometrial adenocarcinoma with lymph node metastasis, pT1ApN1M0, FIGO stage IIIc 3. Genetics 4. HTN 5. Vertigo 6. Emotional support 7. Elevated LFTs   Dawn [name] appears stable today, she has had a baseline echo, EF 65-70%, and has attended chemotherapy education class. She has filled prescriptions for anti-emetics and has Imodium on hand. I do not suspect cellulitis to her right breast, this may reflect postop changes or scar tissue formation. She will complete Cleocin prescription and attempt to make an appointment with her surgeon this week. labs reviewed, CBC WNL, Cmet shows  stable elevated LFTs present at baseline and improved today, she will proceed with cycle 1 TCH with OnPro today. We reviewed PRN medication regimen and signs and symptoms that should prompt her to call the clinic such as fever 100.4, rash, severe fatigue, uncontrolled nausea, vomiting, or diarrhea. We discussed a toxicity f/u next week but the patient declined, she will call if she has any problems and f/u in 3 weeks prior to cycle 2.    Plan: -labs reviewed, OK to proceed with cycle 1 TCH with OnPro today; will monitor LFT's -return in 3 weeks for cycle 2  All questions were answered. The patient knows to call the clinic with any problems, questions or concerns. No barriers to learning was detected. I spent 25 counseling the patient face to face. The total time spent in the appointment was 30 and more than 50% was on counseling and review of test results     Dawn Feeling, NP 07/01/17  This was a shared visit with Cira Rue. Dawn [name] was interviewed and examined. The firmness and induration at the right breast biopsy site is likely postoperative change. I've a low clinical suspicion for an infection. The plan is to proceed with cycle 1 adjuvant chemotherapy today.  She will schedule an appointment with Dr. Excell Seltzer for examination of the right breast.  Julieanne Manson, M.D.

## 2017-07-01 NOTE — Progress Notes (Signed)
Pt tolerated infusion well. Pt stable at discharge. 

## 2017-07-01 NOTE — Progress Notes (Signed)
Per Cira Rue, NP okay to treat with ALT of 88.

## 2017-07-02 LAB — HEPATITIS C ANTIBODY

## 2017-07-02 LAB — HEPATITIS B SURFACE ANTIBODY,QUALITATIVE: Hep B Surface Ab, Qual: NONREACTIVE

## 2017-07-09 ENCOUNTER — Telehealth: Payer: Self-pay | Admitting: *Deleted

## 2017-07-09 NOTE — Telephone Encounter (Signed)
Voicemail from patient requesting return call for rash.  Live call received as well.  "Earlier today my legs were itching.  Looked down at my legs this afternoon to find a red rash everywhere.    It's worse on my arms and trunk.    I took benadryl  25 mg around 3:30 pm before I called.and it's a little better.  Do I need to take more benadryl?  I think it's slightly raised and very red.    Itches but no pain, no fever.       I walked outside yesterday.  Had to use tylenol, Claritin because it was hard to walk with the bad bone pain.  Have not taken any tylenol today, finished the 7-days of clindamycin yesterday.    No new medicines, detergents or soap, perfumes, lotions.    My husband checked body parts I can't see.  Says I'm red with this itchy rash all over my body.    Concerned because a rash is something we're told to report.  Do I need to go to the ER?  My husband has to leave and needs to know.   Return number [phone]."   Routing call information to collaborative nurse and provider for review.  Further patient communication through collaborative nurse.  This nurse informed her she should not need the ED for a rash.

## 2017-07-09 NOTE — Telephone Encounter (Signed)
Called patient back regarding rash. Instructed per [name] to take Benadryl 25 mg every 6 hours and Pepcid 2 times a day. Instructed to call back tomorrow with a update on rash. Verbalized understanding.

## 2017-07-10 MED ORDER — DEXAMETHASONE 4 MG PO TABS: ORAL_TABLET | ORAL | 0 refills | Status: DC

## 2017-07-10 MED FILL — DEXAMETHASONE 4 MG TABLET: 4 | 5 days supply | Qty: 7 | Fill #0

## 2017-07-10 MED FILL — SODIUM CHLORIDE 5% EYE DROP: 5 | 7 days supply | Qty: 1 | Fill #0

## 2017-07-10 NOTE — Telephone Encounter (Signed)
Patient called and left message with update on rash. Rash is not much better. Called back, per [name], and instructed to continue benadryl and Pepcid. Decadron Rx sent to pharmacy. Instructed to take pictures of rash for [name] to see with next visit.

## 2017-07-10 NOTE — Addendum Note (Signed)
Addended by: Flo Shanks on: 07/10/2017 12:19 PM   Modules accepted: Orders

## 2017-07-10 NOTE — Telephone Encounter (Signed)
Called patient back. Per [name] okay to take Robitussin over the counter for a cough. Verbalized understanding.

## 2017-07-20 NOTE — Progress Notes (Signed)
[place]  [phone] [phone]  Clinic Follow up Note   Patient Care Team: [name], MD as PCP - Haskell Riling, MD as Consulting Physician (General Surgery) Truitt Merle, MD as Consulting Physician (Hematology) [name], MD as Attending Physician (Radiation Oncology) 07/22/2017  CHIEF COMPLAINTS:  Follow up right breast cancer and endometrial cancer     Malignant neoplasm of upper-inner quadrant of right breast in female, estrogen receptor positive ([place])   04/27/2017 Initial Biopsy    Diagnosis Breast, right, needle core biopsy INVASIVE DUCTAL CARCINOMA, GRADE 2 Microscopic Comment The neoplasm has intracellular mucin and signet ring cell features. Immunostains shows these cells are positive for ER,GATA3, ck7 and GCDFP, negative for ck20, cdx2, TTF-1 and pax8, The immunostaining pattern supports the neoplasm is breast primary.      04/27/2017 Receptors her2    Estrogen Receptor: 70%, POSITIVE, MODERATE STAINING INTENSITY Progesterone Receptor: 0%, NEGATIVE Proliferation Marker Ki67: 30% HER2 - **POSITIVE** RATIO OF HER2/CEP17 SIGNALS 2.91 AVERAGE HER2 COPY NUMBER PER CELL 7.28      04/27/2017 Initial Diagnosis    Malignant neoplasm of upper-inner quadrant of right breast in female, estrogen receptor positive ([place])       Mammogram         05/07/2017 Imaging    CT Chest W Contrast 05/07/17 IMPRESSION: Tiny well-defined fatty lesion on the pleura at the right lung base. The thinner slice collimation used for today's chest CT eliminates volume-averaging seen in the lesion on the prior exam and confirms that this is a diffusely fatty nodule. This is a benign finding and likely represents a tiny lipoma. No defect in the hemidiaphragm evident to suggest tiny diaphragmatic hernia. Pulmonary hamartoma a consideration although the lack of soft tissue components makes this less likely.      05/15/2017 Genetic  Testing    Patient had genetic testing due to a personal history of breast cancer and uterine cancer as well as a family history of cancer. The Multi-Cancer panel was ordered. The Multi-Cancer Panel offered by [facility] includes sequencing and/or deletion duplication testing of the following 83 genes: ALK, APC, ATM, AXIN2,BAP1,  BARD1, BLM, BMPR1A, BRCA1, BRCA2, BRIP1, CASR, CDC73, CDH1, CDK4, CDKN1B, CDKN1C, CDKN2A (p14ARF), CDKN2A (p16INK4a), CEBPA, CHEK2, CTNNA1, DICER1, DIS3L2, EGFR (c.2369C>T, p.Thr790Met variant only), EPCAM (Deletion/duplication testing only), FH, FLCN, GATA2, GPC3, GREM1 (Promoter region deletion/duplication testing only), HOXB13 (c.251G>A, p.Gly84Glu), HRAS, KIT, MAX, MEN1, MET, MITF (c.952G>A, p.Glu318Lys variant only), MLH1, MSH2, MSH3, MSH6, MUTYH, NBN, NF1, NF2, NTHL1, PALB2, PDGFRA, PHOX2B, PMS2, POLD1, POLE, POT1, PRKAR1A, PTCH1, PTEN, RAD50, RAD51C, RAD51D, RB1, RECQL4, RET, RUNX1, SDHAF2, SDHA (sequence changes only), SDHB, SDHC, SDHD, SMAD4, SMARCA4, SMARCB1, SMARCE1, STK11, SUFU, TERC, TERT, TMEM127, TP53, TSC1, TSC2, VHL, WRN and WT1.   Results: No pathogenic mutations were identified. A VUS in the GATA2 gene c.1348G>A (p.Gly450Arg) was identified.  The date of this test report is 05/25/2017.        05/25/2017 Surgery    RIGHT BREAST LUMPECTOMY WITH RADIOACTIVE SEED AND  RIGHT AXILLARY SENTINEL LYMPH NODE BIOPSY and Pot placement by Dr. Excell Seltzer 05/25/17      05/25/2017 Pathology Results    Diagnosis 05/25/17 1. Breast, lumpectomy, right - INVASIVE DUCTAL CARCINOMA, GRADE II/III, SPANNING 2.2 CM. - DUCTAL CARCINOMA IN SITU, HIGH GRADE. - THE SURGICAL RESECTION MARGINS ARE NEGATIVE FOR CARCINOMA. - SEE ONCOLOGY TABLE BELOW. 2. Breast, excision, right additional superior margin - BENIGN FIBROADIPOSE TISSUE. - BENIGN SKELETAL MUSCLE. - SEE COMMENT. 3. Breast,  excision, right additional lateral margin - BENIGN BREAST PARENCHYMA. - THERE IS NO EVIDENCE OF  MALIGNANCY. - SEE COMMENT. 4. Breast, excision, chest wall margin - BENIGN FIBROADIPOSE TISSUE. - THERE IS NO EVIDENCE OF MALIGNANCY. - SEE COMMENT. 5. Lymph node, sentinel, biopsy, right axillary - THERE IS NO EVIDENCE OF CARCINOMA IN 1 OF 1 LYMPH NODE (0/1). 6. Lymph node, sentinel, biopsy, right axillary - THERE IS NO EVIDENCE OF CARCINOMA IN 1 OF 1 LYMPH NODE (0/1). 7. Lymph node, sentinel, biopsy, right axillary - THERE IS NO EVIDENCE OF CARCINOMA IN 1 OF 1 LYMPH NODE (0/1).      06/26/2017 PET scan    PET  IMPRESSION: 1. Postoperative findings both in the right breast and in the anatomic pelvis, with associated low-grade activity considered to be postoperative in nature. No hypermetabolic adenopathy or hypermetabolic lesions are identified to suggest active metastatic disease/malignancy. 2. Other imaging findings of potential clinical significance: Aortic Atherosclerosis (ICD10-I70.0). Sigmoid colon diverticulosis. Biapical pleuroparenchymal scarring in the lungs. Small amount of free pelvic fluid in the cul-de-sac, likely postoperative.       07/01/2017 -  Chemotherapy    Adjuvant TCH with Onpro every 3 weeks for 6 cycles starting on 07/01/17 followed by Herceptin every 3 weeks for 6 months        Endometrial adenocarcinoma ([place])   05/07/2017 Initial Diagnosis    Endometrial adenocarcinoma ([place])     06/09/2017 Surgery    XI ROBOTIC ASSISTED TOTAL LAPOROSCOPIC HYSTERECTOMY WITH BILATERAL SALPINGO OOPHORECTOMY and SENTINEL NODE BIOPSY by Dr. Denman George 06/09/17      06/09/2017 Pathology Results    Diagnosis 06/09/17 1. Lymph node, sentinel, biopsy, right external iliac - METASTATIC ADENOCARCINOMA IN ONE LYMPH NODE (1/1). 2. Lymph node, sentinel, biopsy, left obturator - ONE BENIGN LYMPH NODE (0/1). 3. Lymph node, sentinel, biopsy, left external iliac - METASTATIC ADENOCARCINOMA IN ONE LYMPH NODE (1/1). 4. Uterus +/- tubes/ovaries, neoplastic ENDOMETRIUM: -  ENDOMETRIAL ADENOCARCINOMA, 3.2 CM. - CARCINOMA INVADES INNER HALF OF MYOMETRIUM. 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 - CERVIX, BILATERAL FALLOPIAN TUBES AND BILATERAL OVARIES FREE OF TUMOR       HISTORY OF PRESENTING ILLNESS (05/06/2017):  Dawn [name] 66 y.o. female is here because of newly diagnosed breast cancer. Screening mammogram detected right breast mass on 04/20/2017. Ultrasound showed 1.5 cm mass in the 1:00 position. The axilla was negative on ultrasound. Biopsy of the right breast was performed on 04/27/2017 revealing invasive ductal carcinoma, Grade 2, ER 70% / PR 0% / HER-2+ / Ki-67 30%.   Of note, she was also recently diagnosed with endometrial cancer. Biopsy of curettage endometrium on 04/29/2017 revealed adenocarcinoma. Immunohistory shows the tumor is positive with estrogen receptor, progesterone receptor with patchy positivity with carcinoembryonic antigen and p16. The tumor is negative with CD10 and vimentin. The morphology and immunophenotype are not definitive as to endocervical or endometrial origin. There are rare fragments with endometrial type stroma, which may indicate an endometrial origin although this is not definitive as to origin.  The patient presents today in our multidisciplinary breast clinic. She is doing well overall. She went to her PCP with abdominal pain and vaginal bleeding which felt like she was having her period all the time. Then she presented to the ER with severe vertigo and heavy vaginal bleeding. She has been having heavy vaginal bleeding since mid-July. She does not have a history of anemia and associates it with recent bleeding. She has been taking oral iron. She took an aspirin  daily but, has not been taking it lately. She had been taking Evista for one year and stopped taking it on her own. She was taking this medication for her bone density. She takes 2-3 Tylenol daily to manage the abdominal pain / cramping discomfort. She believes this  pain is associated with passing clots. She takes a multivitamin daily. The patient is apprehensive about chemotherapy and is unsure if she would like to proceed with chemotherapy if it is recommended. She reports leg cramping. She does not take potassium supplements but, does try to eat bananas. States liver function is typically elevated and has been for about the last 2-3 years.   She has never smoked and doesn't drink alcohol. She did grow up with heavy smoking parents.   Father had colon cancer diagnosed at 15 years-old, and he died of bladder cancer. Paternal grandmother with some type of gynecologic cancer, age of onset in her mid-23s. Patient believes there was more cancer in her family in aunts and uncles however, there are no more living family members to discuss this with.   GYN HISTORY  Menarchal: 3 LMP: age of 39 Contraceptive: none  HRT: none  G2P2: She was 45 when she gave first live births.   CURRENT THERAPY: Adjuvant TCH with Onpro every 3 weeks for 6 cycles starting 07/01/17 followed by Herceptin every 3 weeks for total of 12 months    INTERVAL HISTORY:  YUMA BLUCHER is here for a follow up and second cycle [place]. She presents to the clinic today accompanied by her husband. She notes the rash and shared a picture. She took treatment on the 10/17 and took 9 days to resolve. She could barely walk with neulasta due to bone pain in her hip an lower legs that lasted for a week. She has not taken prescription medication but did take the Claritin. She took her antiemetics and stool softeners which worked well for her. After steroids she had more energy and overall better. She has recovered and is ready for next treatment.  She notes she has complete hair loss on her head and has wigs and hats to wear. She takes Percocet as needed but is willing to try Tramadol.   MEDICAL HISTORY:  Past Medical History:  Diagnosis Date  . Anemia    as a child.  . Arthritis   . Bilateral  cataracts   . Bilateral leg cramps   . Cancer ([place])    skin  . Dyspnea   . Family history of bladder cancer   . Family history of colon cancer in father   . Fuchs' corneal dystrophy   . GERD (gastroesophageal reflux disease)   . Hearing loss    Right side 30%  . History of hiatal hernia    small size  . Hyperlipidemia   . Hypertension   . IBS (irritable bowel syndrome)    hx of  . Malignant neoplasm of upper-inner quadrant of right female breast ([place])   . NAFL (nonalcoholic fatty liver)   . Obesity   . PONV (postoperative nausea and vomiting)   . 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 Uterine cancer ([place])    endometrial cancer  . Varices, gastric   . Vertigo     SURGICAL HISTORY: Past Surgical History:  Procedure Laterality Date  . BREAST BIOPSY Right 04/27/2017  . COLONOSCOPY      SOCIAL HISTORY: Social History   Socioeconomic History  . Marital  status: Married    Spouse name: Not on file  . Number of children: Not on file  . Years of education: Not on file  . Highest education level: Not on file  Social Needs  . Financial resource strain: Not on file  . Food insecurity - worry: Not on file  . Food insecurity - inability: Not on file  . Transportation needs - medical: Not on file  . Transportation needs - non-medical: Not on file  Occupational History    Employer: [employer]    Comment: [employer]   Tobacco Use  . Smoking status: Never Smoker  . Smokeless tobacco: Never Used  Substance and Sexual Activity  . Alcohol use: Yes    Alcohol/week: 0.0 oz    Comment: <1/week wine or beer occasional  . Drug use: No  . Sexual activity: Yes    Birth control/protection: Post-menopausal  Other Topics Concern  . Not on file  Social History Narrative   2-3 caffeine drinks per day. Regular exercise.  Walking 3 miles a day.     FAMILY HISTORY: Family History  Problem Relation Age of Onset  . Colon  cancer Father 47  . Heart attack Father 87  . Prostate cancer Father        dx 7's  . Bladder Cancer Father 40  . Stroke Mother 90  . Diabetes Maternal Grandfather   . Kidney disease Maternal Grandfather   . Asthma Brother   . Cancer Paternal Grandmother 8       GYN cancer ( thinks ovarian, maybe uterine)  . Heart attack Maternal Grandmother 70  . Breast cancer Other 59  . Colon cancer Other 63  . Cancer Other        type unk, age dx unk    ALLERGIES:  is allergic to ciprofloxacin; crestor [rosuvastatin calcium]; and penicillins.  MEDICATIONS:  Current Outpatient Medications  Medication Sig Dispense Refill  . Ascorbic Acid (VITAMIN C) 1000 MG tablet Take 1,000 mg by mouth daily.    . calcium carbonate (TUMS - DOSED IN MG ELEMENTAL CALCIUM) 500 MG chewable tablet Chew 2 tablets by mouth 2 (two) times daily as needed for indigestion or heartburn.    . lidocaine-prilocaine (EMLA) cream Apply to affected area once 30 g 3  . meclizine (ANTIVERT) 25 MG tablet Take 1-2 tablets (25-50 mg total) by mouth 3 (three) times daily as needed for dizziness. (Patient taking differently: Take 25-50 mg by mouth 3 (three) times daily as needed for dizziness (depends on dizziness if takes 1-2 tablets). ) 30 tablet 0  . Multiple Vitamin (MULTIVITAMIN) capsule Take 1 capsule by mouth daily.      Marland Kitchen nystatin (MYCOSTATIN/NYSTOP) powder Apply topically 4 (four) times daily. 15 g 0  . ondansetron (ZOFRAN) 8 MG tablet Take 1 tablet (8 mg total) by mouth 2 (two) times daily as needed for refractory nausea / vomiting. Start on day 3 after chemo. 30 tablet 1  . oxyCODONE-acetaminophen (PERCOCET/ROXICET) 5-325 MG tablet Take 1-2 tablets by mouth every 4 (four) hours as needed (moderate to severe pain). 30 tablet 0  . Pitavastatin Calcium (LIVALO) 2 MG TABS Take 3 mg by mouth every evening. Takes 1.5 tablet    . prochlorperazine (COMPAZINE) 10 MG tablet Take 1 tablet (10 mg total) by mouth every 6 (six) hours as  needed (Nausea or vomiting). 30 tablet 1  . sodium chloride (MURO 128) 5 % ophthalmic ointment Place 1 application into both eyes 2 (  two) times daily.     Marland Kitchen acetaminophen (TYLENOL) 325 MG tablet Take 650 mg by mouth every 6 (six) hours as needed for mild pain or moderate pain.    Marland Kitchen dexamethasone (DECADRON) 4 MG tablet Take 1 tablet (4 mg total) daily by mouth. Take BID X 2 days, then daily until completed. 30 tablet 0  . olmesartan-hydrochlorothiazide (BENICAR HCT) 20-12.5 MG tablet Take 1 tablet by mouth daily. (Patient not taking: Reported on 07/22/2017) 90 tablet 2  . traMADol (ULTRAM) 50 MG tablet Take 1 tablet (50 mg total) every 6 (six) hours as needed by mouth. 30 tablet 0   No current facility-administered medications for this visit.    Facility-Administered Medications Ordered in Other Visits  Medication Dose Route Frequency Provider Last Rate Last Dose  . diphenhydrAMINE (BENADRYL) capsule 50 mg  50 mg Oral Once Truitt Merle, MD      . sodium chloride flush (NS) 0.9 % injection 10 mL  10 mL Intracatheter PRN Truitt Merle, MD   10 mL at 07/22/17 2032    REVIEW OF SYSTEMS:   Constitutional: Denies fevers, chills or abnormal night sweats  (+) complete hair loss Eyes: Denies blurriness of vision, double vision or watery eyes Ears, nose, mouth, throat, and face: Denies mucositis or sore throat Respiratory: Denies cough, dyspnea or wheezes Cardiovascular: Denies palpitation, chest discomfort or lower extremity swelling Gastrointestinal:  Denies nausea, vomiting, constipation, diarrhea, heartburn or change in bowel habits Skin: Denies abnormal skin rashes. (+) Surgical incisions healing well, bruising Lymphatics: Denies new lymphadenopathy or easy bruising Musculoskeletal: Negative Neurological:Denies numbness, tingling or new weaknesses (+) vertigo Behavioral/Psych: Mood is stable, no new changes  All other systems were reviewed with the patient and are negative.  PHYSICAL  EXAMINATION: ECOG PERFORMANCE STATUS: 1 - Symptomatic but completely ambulatory  Vitals:   07/22/17 1423  BP: 132/84  Pulse: 84  Resp: 18  Temp: 98.7 F (37.1 C)  SpO2: 98%   Filed Weights   07/22/17 1423  Weight: 183 lb 3.2 oz (83.1 kg)     GENERAL:alert, no distress and comfortable SKIN: skin color, texture, turgor are normal, no rashes or significant lesions EYES: normal, conjunctiva are pink and non-injected, sclera clear OROPHARYNX:no exudate, no erythema and lips, buccal mucosa, and tongue normal  NECK: supple, thyroid normal size, non-tender, without nodularity LYMPH:  no palpable lymphadenopathy in the cervical, axillary or inguinal LUNGS: clear to auscultation with normal breathing effort HEART: regular rate & rhythm and no lower extremity edema (+) heart murmur in the aortic valve ABDOMEN:abdomen soft, non-tender and normal bowel sounds (+) s/p total hysterectomy and salpingo-oophorectomy, 4 horizontal laparoscopic surgical incisions at the level of the umbilicus, healing well  Musculoskeletal:no cyanosis of digits and no clubbing  PSYCH: alert & oriented x 3 with fluent speech NEURO: no focal motor/sensory deficits BREAST: Left breast shows no palpable mass or adenopathy. Right breast shows no palpable mass or adenopathy.  (+) S/p right lumpectomy, surgical incision in right axilla and around the areola healed well without skin erythema or discharge  LABORATORY DATA:  I have reviewed the data as listed CBC Latest Ref Rng & Units 07/22/2017 07/01/2017 06/10/2017  WBC 3.9 - 10.3 10e3/uL 4.4 4.6 12.8(H)  Hemoglobin 11.6 - 15.9 g/dL 12.5 13.5 11.9(L)  Hematocrit 34.8 - 46.6 % 36.4 39.1 35.2(L)  Platelets 145 - 400 10e3/uL 256 212 258    CMP Latest Ref Rng & Units 07/22/2017 07/01/2017 06/10/2017  Glucose 70 - 140 mg/dl 165(H) 130 124(H)  BUN 7.0 - 26.0 mg/dL 7.8 8.5 12  Creatinine 0.6 - 1.1 mg/dL 0.8 0.8 0.70  Sodium 136 - 145 mEq/L 137 140 136  Potassium 3.5 - 5.1  mEq/L 3.5 3.5 3.9  Chloride 101 - 111 mmol/L - - 102  CO2 22 - 29 mEq/L '25 26 25  '$ Calcium 8.4 - 10.4 mg/dL 9.1 9.5 8.9  Total Protein 6.4 - 8.3 g/dL 7.0 7.1 -  Total Bilirubin 0.20 - 1.20 mg/dL 0.51 0.65 -  Alkaline Phos 40 - 150 U/L 58 46 -  AST 5 - 34 U/L 56(H) 46(H) -  ALT 0 - 55 U/L 137(H) 88(H) -   PATHOLOGY FINDINGS:   Diagnosis 06/09/17 1. Lymph node, sentinel, biopsy, right external iliac - METASTATIC ADENOCARCINOMA IN ONE LYMPH NODE (1/1). 2. Lymph node, sentinel, biopsy, left obturator - ONE BENIGN LYMPH NODE (0/1). 3. Lymph node, sentinel, biopsy, left external iliac - METASTATIC ADENOCARCINOMA IN ONE LYMPH NODE (1/1). 4. Uterus +/- tubes/ovaries, neoplastic ENDOMETRIUM: - ENDOMETRIAL ADENOCARCINOMA, 3.2 CM. - CARCINOMA INVADES INNER HALF OF MYOMETRIUM. 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 - CERVIX, BILATERAL FALLOPIAN TUBES AND BILATERAL OVARIES FREE OF TUMOR. Microscopic Comment 4. ONCOLOGY TABLE-UTERUS, CARCINOMA OR CARCINOSARCOMA Specimen: Uterus with bilateral fallopian tubes and ovaries, right and left external iliac nodes and left obturator lymph node. Procedure: Hysterectomy with bilateral salpingo-oophorectomy and lymph node biopsies. Lymph node sampling performed: Yes. Specimen integrity: Intact. Maximum tumor size: 3.2 cm. Histologic type: Mixed, endometrioid, clear cell and serous. Grade: III. Myometrial invasion: 0.8 cm where myometrium is 2.2 cm in thickness. Cervical stromal involvement: No. Extent of involvement of other organs: N/A. Lymph - vascular invasion: Present. Peritoneal washings: N/A. Lymph nodes: Examined: 3 Sentinel 1 of 3 FINAL for IVIE, SAVITT ([id number]) Microscopic Comment(continued) 0 Non-sentinel 3 Total Lymph nodes with metastasis: 2. Isolated tumor cells (< 0.2 mm): 0. Micrometastasis: (> 0.2 mm and < 2.0 mm): 0. Macrometastasis: (> 2.0 mm): 2. Extracapsular extension: No. TNM code: pT1a, pN1. FIGO Stage  (based on pathologic findings, needs clinical correlation): IIIC1. Comments: The tumor is 3.2 cm in greatest dimension and invades the myometrium to a depth of 0.8 cm where the myometrium is 2.2 cm in thickness and there is lymphatic vascular involvement by tumor. 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 (JDP:ah 06/10/17)   Diagnosis 05/25/17 1. Breast, lumpectomy, right - INVASIVE DUCTAL CARCINOMA, GRADE II/III, SPANNING 2.2 CM. - DUCTAL CARCINOMA IN SITU, HIGH GRADE. - THE SURGICAL RESECTION MARGINS ARE NEGATIVE FOR CARCINOMA. - SEE ONCOLOGY TABLE BELOW. 2. Breast, excision, right additional superior margin - BENIGN FIBROADIPOSE TISSUE. - BENIGN SKELETAL MUSCLE. - SEE COMMENT. 3. Breast, excision, right additional lateral margin - BENIGN BREAST PARENCHYMA. - THERE IS NO EVIDENCE OF MALIGNANCY. - SEE COMMENT. 4. Breast, excision, chest wall margin - BENIGN FIBROADIPOSE TISSUE. - THERE IS NO EVIDENCE OF MALIGNANCY. - SEE COMMENT. 5. Lymph node, sentinel, biopsy, right axillary - THERE IS NO EVIDENCE OF CARCINOMA IN 1 OF 1 LYMPH NODE (0/1). 6. Lymph node, sentinel, biopsy, right axillary - THERE IS NO EVIDENCE OF CARCINOMA IN 1 OF 1 LYMPH NODE (0/1). 7. Lymph node, sentinel, biopsy, right axillary - THERE IS NO EVIDENCE OF CARCINOMA IN 1 OF 1 LYMPH NODE (0/1). Microscopic Comment 1. BREAST, INVASIVE TUMOR Procedure: Seed localized lumpectomy, additional superior, lateral, and chest wall margin resections and axillary lymph node resections. Laterality: Right. Tumor Size: 2.2 cm (gross measurement). Histologic Type: Ductal. Microscopic Comment(continued) Grade: II. Tubular Differentiation:  2. Nuclear Pleomorphism: 2. Mitotic Count: 2. Ductal Carcinoma in Situ (DCIS): Present, high grade. Extent of Tumor: Confined to breast parenchyma. Margins: Greater than or equal to 0.2 cm to all margins. Regional Lymph Nodes: Number of Lymph Nodes Examined:  3. Number of Sentinel Lymph Nodes Examined: 3. Lymph Nodes with Macrometastases: 0. Lymph Nodes with Micrometastases: 0. Lymph Nodes with Isolated Tumor Cells: 0. Breast Prognostic Profile: Case [id number]. Estrogen Receptor: 70%, moderate. Progesterone Receptor: 0%. Her2: Amplification was detected. The ratio was 2.91. Ki-67: 30%. Best tumor block for sendout testing: 1A-1C. Pathologic Stage Classification (pTNM, AJCC 8th Edition): Primary Tumor (pT): pT2. Regional Lymph Nodes (pN): pN0. Distant Metastases (pM): pMX. 2. - 4. The surgical resection margin(s) of the specimen were inked and microscopically evaluated.    Diagnosis 04/29/2017 Endometrium, curettage - ADENOCARCINOMA. - SEE MICROSCOPIC DESCRIPTION. Microscopic Comment There are fragments of adenocarcinoma, some of which have cribriform architecture, and there is focal extracellular mucin. Immunohistochemistry shows the tumor is positive with estrogen receptor, progesterone receptor with patchy positivity with carcinoembryonic antigen and p16. The tumor is negative with CD10 and vimentin. The morphology and immunophenotype are not definitive as to endocervical or endometrial origin. There are rare fragments with endometrial type stroma, which may indicate an endometrial origin although this is not definitive as to origin.  Diagnosis 04/27/2017 Breast, right, needle core biopsy INVASIVE DUCTAL CARCINOMA, GRADE 2 Microscopic Comment The neoplasm has intracellular mucin and signet ring cell features. Immunostains shows these cells are positive for ER, GATA3, ck7 and GCDFP, negative for ck20, cdx2, TTF-1 and pax8, The immunostaining pattern supports the neoplasm is breast primary. The Breast prognostic profile has been ordered. Results: IMMUNOHISTOCHEMICAL AND MORPHOMETRIC ANALYSIS PERFORMED MANUALLY Estrogen Receptor: 70%, POSITIVE, MODERATE STAINING INTENSITY Progesterone Receptor: 0%, NEGATIVE Proliferation Marker  Ki67: 30% Results: HER2 - **POSITIVE** RATIO OF HER2/CEP17 SIGNALS 2.91 AVERAGE HER2 COPY NUMBER PER CELL 7.28  GENETICS 05/07/17   PROCEDURES  ECHO 05/07/17 Impressions: - Normal LV size with mild LV hypertrophy. EF 65-70%, vigorous   systolic function. Strain as above. Normal RV size and systolic   function. No significant valvular abnormalities.    RADIOGRAPHIC STUDIES: I have personally reviewed the radiological images as listed and agreed with the findings in the report. Nm Pet Image Initial (pi) Skull Base To Thigh  Result Date: 06/26/2017 CLINICAL DATA:  Initial Treatment strategy for right breast cancer and concurrent endometrial cancer. EXAM: NUCLEAR MEDICINE PET SKULL BASE TO THIGH TECHNIQUE: 9.1 mCi F-18 FDG was injected intravenously. Full-ring PET imaging was performed from the skull base to thigh after the radiotracer. CT data was obtained and used for attenuation correction and anatomic localization. FASTING BLOOD GLUCOSE:  Value: 105 mg/dl COMPARISON:  Multiple exams, including CT examinations from 05/07/2017 and 04/03/2017 FINDINGS: NECK No hypermetabolic lymph nodes in the neck. CHEST Accentuated activity at the postoperative site in the left upper medial breast, maximum SUV in this region 4.1, likely from postoperative inflammation. Similarly there is some low-level activity associated with the right axillary dissection. No focal abnormal residual activity to favor residual malignancy. No accentuated metabolic nodal activity or significant abnormal pulmonary nodule. 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 Left internal jugular Port-A-Cath tip: Lower SVC. Mild biapical pleuroparenchymal scarring. ABDOMEN/PELVIS There is low-level activity along the remaining left broad ligament and along the right vaginal cuff. This low-level activity is compatible with patient's recent hysterectomy and bilateral salpingo-oophorectomy 2.5 weeks ago, and currently does not raise suspicion  for tumor. There is a small  amount of free pelvic fluid in the cul-de-sac region. No findings of hypermetabolic adenopathy or involvement of the liver, spleen, pancreas, or adrenal glands. Sigmoid colon diverticulosis. Aortoiliac atherosclerotic vascular disease. SKELETON No focal hypermetabolic activity to suggest skeletal metastasis. IMPRESSION: 1. Postoperative findings both in the right breast and in the anatomic pelvis, with associated low-grade activity considered to be postoperative in nature. No hypermetabolic adenopathy or hypermetabolic lesions are identified to suggest active metastatic disease/malignancy. 2. Other imaging findings of potential clinical significance: Aortic Atherosclerosis (ICD10-I70.0). Sigmoid colon diverticulosis. Biapical pleuroparenchymal scarring in the lungs. Small amount of free pelvic fluid in the cul-de-sac, likely postoperative. Electronically Signed   By: Van Clines M.D.   On: 06/26/2017 14:42    ASSESSMENT & PLAN:  66 y.o. 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 Malignant neoplasm of upper inner quadrant of right breast , Invasive ductal carcinoma, pT2N0M0, G2,  ER+/PR-/HER2+ -She underwent right breast lumpectomy with sentinel lymph node biopsy with Dr. Excell Seltzer on 05/25/2017 with port placement -Pathology confirms invasive ductal carcinoma, grade 2, spanning 2.2 cm, margins were clear, no positive lymph nodes, ER positive, PR negative, HER-2 positive. I reviewed this with the patient -We discussed the risk of cancer recurrence after completed surgical resection.  Due to the HER-2 positive disease, she is at moderate to high risk.  I recommend adjuvant chemotherapy.  The regiment was selected to cover for endometrial cancer also. -She has started adjuvant TCH every 3 weeks with Onpro on 07/01/17. Experienced rash and significant t bone pain. I discussed if she continue to  have reaction we can switch her Taxol to abraxane. I called in Tramadol for bone pain.  -Labs reviewed, Liver enzymes increased but has been prior to treatment as well but adequate to treat today.  -F/u on 11/28   2. Endometrial adenocarcinoma with lymph node metastasis, pT1ApN1M0, FIGO stage IIIc -She underwent a total hysterectomy with salpingo-oophorectomy  by Dr. Denman George on 06/09/17 -We discussed her surgical pathology. she has high risk features including lymphovascular involvement, nodal positive disease, and mixed histology with endometrioid, clear cell, and serous carcinoma  -We discussed the high risk of cancer recurrence after surgery extensively, and I strongly encouraged her to consider adjuvant chemotherapy to reduce her risk of recurrence. -she has started adjuvant Taxol plus carboplatin every 3 weeks 6 cycles, which is a standard adjuvant chemotherapy regimen -I sent a message to rad/onc Drs. Isidore Moos and Kinard to coordinate her adjuvant brachytherapy, we will likely start soon, with concurrent chemotherapy  3. Genetics -Given her personal history of breast and endometrial cancer, and a family history of malignancy, she will be referred to see a genetic counselor for genetic testing to ruled out cancer syndromes. -Genetics results were negative   4. HTN -She is on HCTZ and I explained that chemo can effect her BP so if her BP drops we will hold her HCTZ.  -Pt holds her HCTZ before treatment as this can effect her chemo.    5. Vertigo  -Symptoms have returned after surgery  -She will restart her Meclizine.    6. Insurance and Emotional support  -referred to our financial support with [employer]  -She feels saddened by her diagnosis. She declined chaplin and therapy services at this time.  -she has FMLA through November for surgery, she will provide paperwork to extend [facility] through chemotherapy if necessary   7. Elevated LFTs - lFTs are chronically  elevated due to fatty  liver disease -Will need to monitor her liver functions closely while on chemo.  -overall stable   8. Drug Rash -secondary to Taxol, started one week after first cycle chemo  -treated with Benadryl, Pepcid and Decadron, resolved  -I called in dexa, she will take '4mg'$  daily for 5 days after chemo   9. Bone pain from Onpro -I prescribed tramadol today, she will use as needed for moderate-to-severe pain -She will continue Claritin daily for 5 days after the injection   Follow-up: -Prescribe dexamethasone '4mg'$  to take for 5 days after chemo -Prescribe Tramadol today  -Labs reviewed, will proceed to second cycle of [place] today  -lab, flush, f/u and chemo TCH in 3, 6 and 10 weeks  -Send message to [name] about endometrial cancer radiation.      No orders of the defined types were placed in this encounter.   All questions were answered. The patient knows to call the clinic with any problems, questions or concerns. I spent 25 minutes counseling the patient face to face. The total time spent in the appointment was 30 minutes and more than 50% was on counseling.    Truitt Merle  07/22/2017    This document serves as a record of services personally performed by Truitt Merle, MD. It was created on her behalf by Joslyn Devon, a trained medical scribe. The creation of this record is based on the scribe's personal observations and the provider's statements to them.    I have reviewed the above documentation for accuracy and completeness, and I agree with the above.

## 2017-07-21 ENCOUNTER — Telehealth: Payer: Self-pay | Admitting: Hematology

## 2017-07-21 NOTE — Telephone Encounter (Signed)
07/21/2017 @ 8:35 am faxed completed FMLA/Disability forms to [employer] @ [phone]. Called and left message @ [phone] for patient @ 8:43 am informing her that her forms were successfully completed and faxed.

## 2017-07-22 ENCOUNTER — Ambulatory Visit: Payer: 59

## 2017-07-22 ENCOUNTER — Other Ambulatory Visit (HOSPITAL_BASED_OUTPATIENT_CLINIC_OR_DEPARTMENT_OTHER): Payer: 59

## 2017-07-22 ENCOUNTER — Encounter: Payer: Self-pay | Admitting: Hematology

## 2017-07-22 ENCOUNTER — Ambulatory Visit (HOSPITAL_BASED_OUTPATIENT_CLINIC_OR_DEPARTMENT_OTHER): Payer: 59 | Admitting: Hematology

## 2017-07-22 ENCOUNTER — Ambulatory Visit (HOSPITAL_BASED_OUTPATIENT_CLINIC_OR_DEPARTMENT_OTHER): Payer: 59

## 2017-07-22 VITALS — BP 132/84 | HR 84 | Temp 98.7°F | Resp 18 | Ht 63.0 in | Wt 183.2 lb

## 2017-07-22 DIAGNOSIS — C50211 Malignant neoplasm of upper-inner quadrant of right female breast: Secondary | ICD-10-CM

## 2017-07-22 DIAGNOSIS — L270 Generalized skin eruption due to drugs and medicaments taken internally: Secondary | ICD-10-CM

## 2017-07-22 DIAGNOSIS — C775 Secondary and unspecified malignant neoplasm of intrapelvic lymph nodes: Secondary | ICD-10-CM

## 2017-07-22 DIAGNOSIS — Z5112 Encounter for antineoplastic immunotherapy: Secondary | ICD-10-CM

## 2017-07-22 DIAGNOSIS — M810 Age-related osteoporosis without current pathological fracture: Secondary | ICD-10-CM

## 2017-07-22 DIAGNOSIS — Z170 Estrogen receptor positive status [ER+]: Secondary | ICD-10-CM

## 2017-07-22 DIAGNOSIS — Z95828 Presence of other vascular implants and grafts: Secondary | ICD-10-CM

## 2017-07-22 DIAGNOSIS — I10 Essential (primary) hypertension: Secondary | ICD-10-CM | POA: Diagnosis not present

## 2017-07-22 DIAGNOSIS — M898X9 Other specified disorders of bone, unspecified site: Secondary | ICD-10-CM

## 2017-07-22 DIAGNOSIS — C541 Malignant neoplasm of endometrium: Secondary | ICD-10-CM | POA: Diagnosis not present

## 2017-07-22 DIAGNOSIS — R7989 Other specified abnormal findings of blood chemistry: Secondary | ICD-10-CM

## 2017-07-22 DIAGNOSIS — Z5111 Encounter for antineoplastic chemotherapy: Secondary | ICD-10-CM | POA: Diagnosis not present

## 2017-07-22 HISTORY — DX: Presence of other vascular implants and grafts: Z95.828

## 2017-07-22 LAB — CBC WITH DIFFERENTIAL/PLATELET
BASO%: 0.5 % (ref 0.0–2.0)
Basophils Absolute: 0 10*3/uL (ref 0.0–0.1)
EOS%: 1.7 % (ref 0.0–7.0)
Eosinophils Absolute: 0.1 10*3/uL (ref 0.0–0.5)
HEMATOCRIT: 36.4 % (ref 34.8–46.6)
HGB: 12.5 g/dL (ref 11.6–15.9)
LYMPH#: 1.7 10*3/uL (ref 0.9–3.3)
LYMPH%: 39.6 % (ref 14.0–49.7)
MCH: 33.6 pg (ref 25.1–34.0)
MCHC: 34.4 g/dL (ref 31.5–36.0)
MCV: 97.6 fL (ref 79.5–101.0)
MONO#: 0.7 10*3/uL (ref 0.1–0.9)
MONO%: 15.4 % — ABNORMAL HIGH (ref 0.0–14.0)
NEUT%: 42.8 % (ref 38.4–76.8)
NEUTROS ABS: 1.9 10*3/uL (ref 1.5–6.5)
Platelets: 256 10*3/uL (ref 145–400)
RBC: 3.72 10*6/uL (ref 3.70–5.45)
RDW: 13 % (ref 11.2–14.5)
WBC: 4.4 10*3/uL (ref 3.9–10.3)

## 2017-07-22 LAB — COMPREHENSIVE METABOLIC PANEL
ALT: 137 U/L — AB (ref 0–55)
AST: 56 U/L — AB (ref 5–34)
Albumin: 3.5 g/dL (ref 3.5–5.0)
Alkaline Phosphatase: 58 U/L (ref 40–150)
Anion Gap: 10 mEq/L (ref 3–11)
BUN: 7.8 mg/dL (ref 7.0–26.0)
CALCIUM: 9.1 mg/dL (ref 8.4–10.4)
CHLORIDE: 103 meq/L (ref 98–109)
CO2: 25 meq/L (ref 22–29)
CREATININE: 0.8 mg/dL (ref 0.6–1.1)
EGFR: >60 mL/min/{1.73_m2} (ref >60)
Glucose: 165 mg/dl — ABNORMAL HIGH (ref 70–140)
Potassium: 3.5 mEq/L (ref 3.5–5.1)
Sodium: 137 mEq/L (ref 136–145)
Total Bilirubin: 0.51 mg/dL (ref 0.20–1.20)
Total Protein: 7 g/dL (ref 6.4–8.3)

## 2017-07-22 MED ORDER — SODIUM CHLORIDE 0.9 % IV SOLN
175.0000 mg/m2 | Freq: Once | INTRAVENOUS | Status: AC
  Administered 2017-07-22: 342 mg via INTRAVENOUS
  Filled 2017-07-22: qty 57

## 2017-07-22 MED ORDER — TRASTUZUMAB CHEMO 150 MG IV SOLR
6.0000 mg/kg | Freq: Once | INTRAVENOUS | Status: AC
  Administered 2017-07-22: 525 mg via INTRAVENOUS
  Filled 2017-07-22: qty 25

## 2017-07-22 MED ORDER — DIPHENHYDRAMINE HCL 50 MG/ML IJ SOLN
INTRAMUSCULAR | Status: AC
  Filled 2017-07-22: qty 1

## 2017-07-22 MED ORDER — SODIUM CHLORIDE 0.9% FLUSH
10.0000 mL | INTRAVENOUS | Status: DC | PRN
  Administered 2017-07-22: 10 mL
  Filled 2017-07-22: qty 10

## 2017-07-22 MED ORDER — SODIUM CHLORIDE 0.9 % IV SOLN
Freq: Once | INTRAVENOUS | Status: AC
  Administered 2017-07-22: 15:00:00 via INTRAVENOUS

## 2017-07-22 MED ORDER — SODIUM CHLORIDE 0.9% FLUSH
10.0000 mL | INTRAVENOUS | Status: DC | PRN
  Administered 2017-07-22: 10 mL via INTRAVENOUS
  Filled 2017-07-22: qty 10

## 2017-07-22 MED ORDER — TRAMADOL HCL 50 MG PO TABS: 50.0000 mg | ORAL_TABLET | Freq: Four times a day (QID) | ORAL | 0 refills | Status: DC | PRN

## 2017-07-22 MED ORDER — PALONOSETRON HCL INJECTION 0.25 MG/5ML
INTRAVENOUS | Status: AC
  Filled 2017-07-22: qty 5

## 2017-07-22 MED ORDER — DEXAMETHASONE 4 MG PO TABS: 4.0000 mg | ORAL_TABLET | Freq: Every day | ORAL | 0 refills | Status: DC

## 2017-07-22 MED ORDER — HEPARIN SOD (PORK) LOCK FLUSH 100 UNIT/ML IV SOLN
500.0000 [IU] | Freq: Once | INTRAVENOUS | Status: AC | PRN
  Administered 2017-07-22: 500 [IU]
  Filled 2017-07-22: qty 5

## 2017-07-22 MED ORDER — ACETAMINOPHEN 325 MG PO TABS
ORAL_TABLET | ORAL | Status: AC
  Filled 2017-07-22: qty 2

## 2017-07-22 MED ORDER — PALONOSETRON HCL INJECTION 0.25 MG/5ML
0.2500 mg | Freq: Once | INTRAVENOUS | Status: AC
  Administered 2017-07-22: 0.25 mg via INTRAVENOUS

## 2017-07-22 MED ORDER — DIPHENHYDRAMINE HCL 50 MG/ML IJ SOLN
50.0000 mg | Freq: Once | INTRAMUSCULAR | Status: AC
  Administered 2017-07-22: 50 mg via INTRAVENOUS

## 2017-07-22 MED ORDER — FAMOTIDINE IN NACL 20-0.9 MG/50ML-% IV SOLN
INTRAVENOUS | Status: AC
  Filled 2017-07-22: qty 50

## 2017-07-22 MED ORDER — CARBOPLATIN CHEMO INJECTION 600 MG/60ML
607.2000 mg | Freq: Once | INTRAVENOUS | Status: AC
  Administered 2017-07-22: 610 mg via INTRAVENOUS
  Filled 2017-07-22: qty 61

## 2017-07-22 MED ORDER — DIPHENHYDRAMINE HCL 25 MG PO CAPS: 50.0000 mg | ORAL_CAPSULE | Freq: Once | ORAL | Status: DC

## 2017-07-22 MED ORDER — ACETAMINOPHEN 325 MG PO TABS
650.0000 mg | ORAL_TABLET | Freq: Once | ORAL | Status: AC
  Administered 2017-07-22: 650 mg via ORAL

## 2017-07-22 MED ORDER — SODIUM CHLORIDE 0.9 % IV SOLN
Freq: Once | INTRAVENOUS | Status: AC
  Administered 2017-07-22: 16:00:00 via INTRAVENOUS
  Filled 2017-07-22: qty 5

## 2017-07-22 MED ORDER — PEGFILGRASTIM 6 MG/0.6ML ~~LOC~~ PSKT
6.0000 mg | PREFILLED_SYRINGE | Freq: Once | SUBCUTANEOUS | Status: AC
  Administered 2017-07-22: 6 mg via SUBCUTANEOUS
  Filled 2017-07-22: qty 0.6

## 2017-07-22 MED ORDER — FAMOTIDINE IN NACL 20-0.9 MG/50ML-% IV SOLN
20.0000 mg | Freq: Once | INTRAVENOUS | Status: AC
  Administered 2017-07-22: 20 mg via INTRAVENOUS

## 2017-07-22 MED FILL — DEXAMETHASONE 4 MG TABLET: 4 | 21 days supply | Qty: 30 | Fill #0

## 2017-07-22 MED FILL — traMADol HCL 50 MG TABS: 50 | 7 days supply | Qty: 30 | Fill #0

## 2017-07-22 NOTE — Progress Notes (Signed)
OK to treat with AST  56 ,  And  ALT  137  As per [name].

## 2017-07-22 NOTE — Patient Instructions (Signed)
[facility] Discharge Instructions for Patients Receiving Chemotherapy  Today you received the following chemotherapy agents Herceptin,Taxol and Carboplatin  To help prevent nausea and vomiting after your treatment, we encourage you to take your nausea medication as directed If you develop nausea and vomiting that is not controlled by your nausea medication, call the clinic.   BELOW ARE SYMPTOMS THAT SHOULD BE REPORTED IMMEDIATELY:  *FEVER GREATER THAN 100.5 F  *CHILLS WITH OR WITHOUT FEVER  NAUSEA AND VOMITING THAT IS NOT CONTROLLED WITH YOUR NAUSEA MEDICATION  *UNUSUAL SHORTNESS OF BREATH  *UNUSUAL BRUISING OR BLEEDING  TENDERNESS IN MOUTH AND THROAT WITH OR WITHOUT PRESENCE OF ULCERS  *URINARY PROBLEMS  *BOWEL PROBLEMS  UNUSUAL RASH Items with * indicate a potential emergency and should be followed up as soon as possible.  Feel free to call the clinic should you have any questions or concerns. The clinic phone number is [phone].  Please show the [facility] at check-in to the Emergency Department and triage nurse.    Trastuzumab injection for infusion What is this medicine? TRASTUZUMAB (tras TOO zoo mab) is a monoclonal antibody. It is used to treat breast cancer and stomach cancer. This medicine may be used for other purposes; ask your health care provider or pharmacist if you have questions. COMMON BRAND NAME(S): Herceptin What should I tell my health care provider before I take this medicine? They need to know if you have any of these conditions: -heart disease -heart failure -lung or breathing disease, like asthma -an unusual or allergic reaction to trastuzumab, benzyl alcohol, or other medications, foods, dyes, or preservatives -pregnant or trying to get pregnant -breast-feeding How should I use this medicine? This drug is given as an infusion into a vein. It is administered in a hospital or clinic by a specially trained health care  professional. Talk to your pediatrician regarding the use of this medicine in children. This medicine is not approved for use in children. Overdosage: If you think you have taken too much of this medicine contact a poison control center or emergency room at once. NOTE: This medicine is only for you. Do not share this medicine with others. What if I miss a dose? It is important not to miss a dose. Call your doctor or health care professional if you are unable to keep an appointment. What may interact with this medicine? This medicine may interact with the following medications: -certain types of chemotherapy, such as daunorubicin, doxorubicin, epirubicin, and idarubicin This list may not describe all possible interactions. Give your health care provider a list of all the medicines, herbs, non-prescription drugs, or dietary supplements you use. Also tell them if you smoke, drink alcohol, or use illegal drugs. Some items may interact with your medicine. What should I watch for while using this medicine? Visit your doctor for checks on your progress. Report any side effects. Continue your course of treatment even though you feel ill unless your doctor tells you to stop. Call your doctor or health care professional for advice if you get a fever, chills or sore throat, or other symptoms of a cold or flu. Do not treat yourself. Try to avoid being around people who are sick. You may experience fever, chills and shaking during your first infusion. These effects are usually mild and can be treated with other medicines. Report any side effects during the infusion to your health care professional. Fever and chills usually do not happen with later infusions. Do not become  pregnant while taking this medicine or for 7 months after stopping it. Women should inform their doctor if they wish to become pregnant or think they might be pregnant. Women of child-bearing potential will need to have a negative pregnancy test  before starting this medicine. There is a potential for serious side effects to an unborn child. Talk to your health care professional or pharmacist for more information. Do not breast-feed an infant while taking this medicine or for 7 months after stopping it. Women must use effective birth control with this medicine. What side effects may I notice from receiving this medicine? Side effects that you should report to your doctor or health care professional as soon as possible: -allergic reactions like skin rash, itching or hives, swelling of the face, lips, or tongue -chest pain or palpitations -cough -dizziness -feeling faint or lightheaded, falls -fever -general ill feeling or flu-like symptoms -signs of worsening heart failure like breathing problems; swelling in your legs and feet -unusually weak or tired Side effects that usually do not require medical attention (report to your doctor or health care professional if they continue or are bothersome): -bone pain -changes in taste -diarrhea -joint pain -nausea/vomiting -weight loss This list may not describe all possible side effects. Call your doctor for medical advice about side effects. You may report side effects to FDA at 1-800-FDA-1088. Where should I keep my medicine? This drug is given in a hospital or clinic and will not be stored at home. NOTE: This sheet is a summary. It may not cover all possible information. If you have questions about this medicine, talk to your doctor, pharmacist, or health care provider.  2018 Elsevier/Gold Standard (2016-08-26 14:37:52.    Paclitaxel injection What is this medicine? PACLITAXEL (PAK li TAX el) is a chemotherapy drug. It targets fast dividing cells, like cancer cells, and causes these cells to die. This medicine is used to treat ovarian cancer, breast cancer, and other cancers. This medicine may be used for other purposes; ask your health care provider or pharmacist if you have  questions. COMMON BRAND NAME(S): Onxol, Taxol What should I tell my health care provider before I take this medicine? They need to know if you have any of these conditions: -blood disorders -irregular heartbeat -infection (especially a virus infection such as chickenpox, cold sores, or herpes) -liver disease -previous or ongoing radiation therapy -an unusual or allergic reaction to paclitaxel, alcohol, polyoxyethylated castor oil, other chemotherapy agents, other medicines, foods, dyes, or preservatives -pregnant or trying to get pregnant -breast-feeding How should I use this medicine? This drug is given as an infusion into a vein. It is administered in a hospital or clinic by a specially trained health care professional. Talk to your pediatrician regarding the use of this medicine in children. Special care may be needed. Overdosage: If you think you have taken too much of this medicine contact a poison control center or emergency room at once. NOTE: This medicine is only for you. Do not share this medicine with others. What if I miss a dose? It is important not to miss your dose. Call your doctor or health care professional if you are unable to keep an appointment. What may interact with this medicine? Do not take this medicine with any of the following medications: -disulfiram -metronidazole This medicine may also interact with the following medications: -cyclosporine -diazepam -ketoconazole -medicines to increase blood counts like filgrastim, pegfilgrastim, sargramostim -other chemotherapy drugs like cisplatin, doxorubicin, epirubicin, etoposide, teniposide, vincristine -quinidine -testosterone -vaccines -  verapamil Talk to your doctor or health care professional before taking any of these medicines: -acetaminophen -aspirin -ibuprofen -ketoprofen -naproxen This list may not describe all possible interactions. Give your health care provider a list of all the medicines, herbs,  non-prescription drugs, or dietary supplements you use. Also tell them if you smoke, drink alcohol, or use illegal drugs. Some items may interact with your medicine. What should I watch for while using this medicine? Your condition will be monitored carefully while you are receiving this medicine. You will need important blood work done while you are taking this medicine. This medicine can cause serious allergic reactions. To reduce your risk you will need to take other medicine(s) before treatment with this medicine. If you experience allergic reactions like skin rash, itching or hives, swelling of the face, lips, or tongue, tell your doctor or health care professional right away. In some cases, you may be given additional medicines to help with side effects. Follow all directions for their use. This drug may make you feel generally unwell. This is not uncommon, as chemotherapy can affect healthy cells as well as cancer cells. Report any side effects. Continue your course of treatment even though you feel ill unless your doctor tells you to stop. Call your doctor or health care professional for advice if you get a fever, chills or sore throat, or other symptoms of a cold or flu. Do not treat yourself. This drug decreases your body's ability to fight infections. Try to avoid being around people who are sick. This medicine may increase your risk to bruise or bleed. Call your doctor or health care professional if you notice any unusual bleeding. Be careful brushing and flossing your teeth or using a toothpick because you may get an infection or bleed more easily. If you have any dental work done, tell your dentist you are receiving this medicine. Avoid taking products that contain aspirin, acetaminophen, ibuprofen, naproxen, or ketoprofen unless instructed by your doctor. These medicines may hide a fever. Do not become pregnant while taking this medicine. Women should inform their doctor if they wish to  become pregnant or think they might be pregnant. There is a potential for serious side effects to an unborn child. Talk to your health care professional or pharmacist for more information. Do not breast-feed an infant while taking this medicine. Men are advised not to father a child while receiving this medicine. This product may contain alcohol. Ask your pharmacist or healthcare provider if this medicine contains alcohol. Be sure to tell all healthcare providers you are taking this medicine. Certain medicines, like metronidazole and disulfiram, can cause an unpleasant reaction when taken with alcohol. The reaction includes flushing, headache, nausea, vomiting, sweating, and increased thirst. The reaction can last from 30 minutes to several hours. What side effects may I notice from receiving this medicine? Side effects that you should report to your doctor or health care professional as soon as possible: -allergic reactions like skin rash, itching or hives, swelling of the face, lips, or tongue -low blood counts - This drug may decrease the number of white blood cells, red blood cells and platelets. You may be at increased risk for infections and bleeding. -signs of infection - fever or chills, cough, sore throat, pain or difficulty passing urine -signs of decreased platelets or bleeding - bruising, pinpoint red spots on the skin, black, tarry stools, nosebleeds -signs of decreased red blood cells - unusually weak or tired, fainting spells, lightheadedness -breathing problems -chest pain -  high or low blood pressure -mouth sores -nausea and vomiting -pain, swelling, redness or irritation at the injection site -pain, tingling, numbness in the hands or feet -slow or irregular heartbeat -swelling of the ankle, feet, hands Side effects that usually do not require medical attention (report to your doctor or health care professional if they continue or are bothersome): -bone pain -complete hair loss  including hair on your head, underarms, pubic hair, eyebrows, and eyelashes -changes in the color of fingernails -diarrhea -loosening of the fingernails -loss of appetite -muscle or joint pain -red flush to skin -sweating This list may not describe all possible side effects. Call your doctor for medical advice about side effects. You may report side effects to FDA at 1-800-FDA-1088. Where should I keep my medicine? This drug is given in a hospital or clinic and will not be stored at home. NOTE: This sheet is a summary. It may not cover all possible information. If you have questions about this medicine, talk to your doctor, pharmacist, or health care provider.  2018 Elsevier/Gold Standard (2015-07-03 19:58:00)    Carboplatin injection What is this medicine? CARBOPLATIN (KAR boe pla tin) is a chemotherapy drug. It targets fast dividing cells, like cancer cells, and causes these cells to die. This medicine is used to treat ovarian cancer and many other cancers. This medicine may be used for other purposes; ask your health care provider or pharmacist if you have questions. COMMON BRAND NAME(S): Paraplatin What should I tell my health care provider before I take this medicine? They need to know if you have any of these conditions: -blood disorders -hearing problems -kidney disease -recent or ongoing radiation therapy -an unusual or allergic reaction to carboplatin, cisplatin, other chemotherapy, other medicines, foods, dyes, or preservatives -pregnant or trying to get pregnant -breast-feeding How should I use this medicine? This drug is usually given as an infusion into a vein. It is administered in a hospital or clinic by a specially trained health care professional. Talk to your pediatrician regarding the use of this medicine in children. Special care may be needed. Overdosage: If you think you have taken too much of this medicine contact a poison control center or emergency room at  once. NOTE: This medicine is only for you. Do not share this medicine with others. What if I miss a dose? It is important not to miss a dose. Call your doctor or health care professional if you are unable to keep an appointment. What may interact with this medicine? -medicines for seizures -medicines to increase blood counts like filgrastim, pegfilgrastim, sargramostim -some antibiotics like amikacin, gentamicin, neomycin, streptomycin, tobramycin -vaccines Talk to your doctor or health care professional before taking any of these medicines: -acetaminophen -aspirin -ibuprofen -ketoprofen -naproxen This list may not describe all possible interactions. Give your health care provider a list of all the medicines, herbs, non-prescription drugs, or dietary supplements you use. Also tell them if you smoke, drink alcohol, or use illegal drugs. Some items may interact with your medicine. What should I watch for while using this medicine? Your condition will be monitored carefully while you are receiving this medicine. You will need important blood work done while you are taking this medicine. This drug may make you feel generally unwell. This is not uncommon, as chemotherapy can affect healthy cells as well as cancer cells. Report any side effects. Continue your course of treatment even though you feel ill unless your doctor tells you to stop. In some cases, you may be  given additional medicines to help with side effects. Follow all directions for their use. Call your doctor or health care professional for advice if you get a fever, chills or sore throat, or other symptoms of a cold or flu. Do not treat yourself. This drug decreases your body's ability to fight infections. Try to avoid being around people who are sick. This medicine may increase your risk to bruise or bleed. Call your doctor or health care professional if you notice any unusual bleeding. Be careful brushing and flossing your teeth or  using a toothpick because you may get an infection or bleed more easily. If you have any dental work done, tell your dentist you are receiving this medicine. Avoid taking products that contain aspirin, acetaminophen, ibuprofen, naproxen, or ketoprofen unless instructed by your doctor. These medicines may hide a fever. Do not become pregnant while taking this medicine. Women should inform their doctor if they wish to become pregnant or think they might be pregnant. There is a potential for serious side effects to an unborn child. Talk to your health care professional or pharmacist for more information. Do not breast-feed an infant while taking this medicine. What side effects may I notice from receiving this medicine? Side effects that you should report to your doctor or health care professional as soon as possible: -allergic reactions like skin rash, itching or hives, swelling of the face, lips, or tongue -signs of infection - fever or chills, cough, sore throat, pain or difficulty passing urine -signs of decreased platelets or bleeding - bruising, pinpoint red spots on the skin, black, tarry stools, nosebleeds -signs of decreased red blood cells - unusually weak or tired, fainting spells, lightheadedness -breathing problems -changes in hearing -changes in vision -chest pain -high blood pressure -low blood counts - This drug may decrease the number of white blood cells, red blood cells and platelets. You may be at increased risk for infections and bleeding. -nausea and vomiting -pain, swelling, redness or irritation at the injection site -pain, tingling, numbness in the hands or feet -problems with balance, talking, walking -trouble passing urine or change in the amount of urine Side effects that usually do not require medical attention (report to your doctor or health care professional if they continue or are bothersome): -hair loss -loss of appetite -metallic taste in the mouth or changes in  taste This list may not describe all possible side effects. Call your doctor for medical advice about side effects. You may report side effects to FDA at 1-800-FDA-1088. Where should I keep my medicine? This drug is given in a hospital or clinic and will not be stored at home. NOTE: This sheet is a summary. It may not cover all possible information. If you have questions about this medicine, talk to your doctor, pharmacist, or health care provider.  2018 Elsevier/Gold Standard (2007-12-07 14:38:05)

## 2017-07-23 DIAGNOSIS — Z760 Encounter for issue of repeat prescription: Secondary | ICD-10-CM | POA: Diagnosis not present

## 2017-07-24 ENCOUNTER — Telehealth: Payer: Self-pay | Admitting: Hematology

## 2017-07-24 ENCOUNTER — Other Ambulatory Visit: Payer: Self-pay | Admitting: Gynecologic Oncology

## 2017-07-24 ENCOUNTER — Encounter: Payer: Self-pay | Admitting: Radiation Oncology

## 2017-07-24 DIAGNOSIS — C541 Malignant neoplasm of endometrium: Secondary | ICD-10-CM

## 2017-07-24 NOTE — Telephone Encounter (Signed)
Patient called in wanting to schedule her next appointments that were not on her scheduled per 11/7 los

## 2017-07-27 DIAGNOSIS — Z760 Encounter for issue of repeat prescription: Secondary | ICD-10-CM | POA: Diagnosis not present

## 2017-07-30 NOTE — Progress Notes (Signed)
GYN Location of Tumor / Histology:  04/29/17 Diagnosis Endometrium, curettage - ADENOCARCINOMA  06/09/17 Diagnosis 1. Lymph node, sentinel, biopsy, right external iliac - METASTATIC ADENOCARCINOMA IN ONE LYMPH NODE (1/1). 2. Lymph node, sentinel, biopsy, left obturator - ONE BENIGN LYMPH NODE (0/1). 3. Lymph node, sentinel, biopsy, left external iliac - METASTATIC ADENOCARCINOMA IN ONE LYMPH NODE (1/1). 4. Uterus +/- tubes/ovaries, neoplastic ENDOMETRIUM: - ENDOMETRIAL ADENOCARCINOMA, 3.2 CM. - CARCINOMA INVADES INNER HALF OF MYOMETRIUM. 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 - CERVIX, BILATERAL FALLOPIAN TUBES AND BILATERAL OVARIES FREE OF TUMOR.  Dawn [name] presented with symptoms of: Abdominal pain and vaginal bleeding since mid July.   Biopsies revealed: Adenocarcinoma of her Endometrium, curettage; right external iliac lymph node; left external iliac lymph node; inner half of myometrium; and lymphatic vascular involvement by tumor.   Past/Anticipated interventions by Gyn/Onc surgery, if any:  06/09/17 Operation:Robotic-assisted laparoscopic total hysterectomy with bilateral salpingoophorectomy, SLN biopsy Surgeon: Donaciano Eva  Past/Anticipated interventions by medical oncology, if any:  [name] 07/22/17 CURRENT THERAPY: Adjuvant TCH with Onpro every 3 weeks for 6 cycles starting 07/01/17 followed by Herceptin every 3 weeks for total of 12 months -- She received her 2nd cycle on 07/22/17  Weight changes, if any: She has gained a few pounds which she relates to the steroid she receives during chemotherapy.   Bowel/Bladder complaints, if any: She reports recent constipation. She also reports frequent urination, and also reports she is drinking 4-6 bottles of water daily.   Nausea/Vomiting, if any: She denies  Pain issues, if any:  She denies  SAFETY ISSUES:  Prior radiation? No  Pacemaker/ICD? No  Possible current pregnancy? No  Is the patient on  methotrexate? No  Current Complaints / other details:   Ms. Fales was also diagnosed with Right Breast cancer by biospy on  04/27/17. 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 She had a lumpectomy on 05/25/17 by Dr. Excell Seltzer which revealed: Diagnosis 1. Breast, lumpectomy, right - INVASIVE DUCTAL CARCINOMA, GRADE II/III, SPANNING 2.2 CM. - DUCTAL CARCINOMA IN SITU, HIGH GRADE. - THE SURGICAL RESECTION MARGINS ARE NEGATIVE FOR CARCINOMA. - SEE ONCOLOGY TABLE BELOW. 2. Breast, excision, right additional superior margin - BENIGN FIBROADIPOSE TISSUE. - BENIGN SKELETAL MUSCLE. - SEE COMMENT. 3. Breast, excision, right additional lateral margin - BENIGN BREAST PARENCHYMA. - THERE IS NO EVIDENCE OF MALIGNANCY. - SEE COMMENT. 4. Breast, excision, chest wall margin - BENIGN FIBROADIPOSE TISSUE. - THERE IS NO EVIDENCE OF MALIGNANCY. - SEE COMMENT. 5. Lymph node, sentinel, biopsy, right axillary - THERE IS NO EVIDENCE OF CARCINOMA IN 1 OF 1 LYMPH NODE (0/1). 6. Lymph node, sentinel, biopsy, right axillary - THERE IS NO EVIDENCE OF CARCINOMA IN 1 OF 1 LYMPH NODE (0/1). 7. Lymph node, sentinel, biopsy, right axillary - THERE IS NO EVIDENCE OF CARCINOMA IN 1 OF 1 LYMPH NODE (0/1).  BP 128/81   Pulse 93   Temp 99 F (37.2 C)   Ht _0  (1.6 m)   Wt 183 lb (83 kg)   SpO2 97% Comment: room air  BMI 32.42 kg/m    Wt Readings from Last 3 Encounters:  08/04/17 183 lb (83 kg)  07/22/17 183 lb 3.2 oz (83.1 kg)  07/01/17 182 lb 3.2 oz (82.6 kg)

## 2017-08-04 ENCOUNTER — Ambulatory Visit
Admission: RE | Admit: 2017-08-04 | Discharge: 2017-08-04 | Disposition: A | Discharge status: Home / Self Care | Payer: 59 | Source: Ambulatory Visit | Attending: Radiation Oncology | Admitting: Radiation Oncology

## 2017-08-04 ENCOUNTER — Encounter: Payer: Self-pay | Admitting: Radiation Oncology

## 2017-08-04 VITALS — BP 128/81 | HR 93 | Temp 99.0°F | Ht 63.0 in | Wt 183.0 lb

## 2017-08-04 DIAGNOSIS — C50211 Malignant neoplasm of upper-inner quadrant of right female breast: Secondary | ICD-10-CM | POA: Diagnosis not present

## 2017-08-04 DIAGNOSIS — Z9889 Other specified postprocedural states: Secondary | ICD-10-CM | POA: Diagnosis not present

## 2017-08-04 DIAGNOSIS — Z170 Estrogen receptor positive status [ER+]: Secondary | ICD-10-CM

## 2017-08-04 DIAGNOSIS — C541 Malignant neoplasm of endometrium: Secondary | ICD-10-CM | POA: Insufficient documentation

## 2017-08-04 DIAGNOSIS — Z90710 Acquired absence of both cervix and uterus: Secondary | ICD-10-CM | POA: Diagnosis not present

## 2017-08-04 MED ORDER — FLUCONAZOLE 100 MG PO TABS: ORAL_TABLET | ORAL | 0 refills | Status: DC

## 2017-08-04 MED FILL — FLUCONAZOLE 100 MG TABLET: 100 | 7 days supply | Qty: 8 | Fill #0

## 2017-08-04 MED FILL — LIDOCAINE-PRILOCAINE CREAM: 2.5-2.5 | 30 days supply | Qty: 30 | Fill #0

## 2017-08-04 NOTE — Progress Notes (Signed)
Radiation Oncology         [phone] ________________________________  Name: Dawn [name] MRN: [id number]  Date: 08/04/2017  DOB: 1951-04-17  Follow-Up Visit Note  Outpatient  CC: [name], MD  Truitt Merle, MD  Diagnosis:      ICD-10-CM   1. Endometrial cancer (HCC) C54.1 fluconazole (DIFLUCAN) 100 MG tablet  2. 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 Malignant neoplasm of upper-inner quadrant of right breast in female, estrogen receptor positive ([place]) C50.211    Z17.0      Cancer Staging Endometrial cancer [facility]) Staging form: Corpus Uteri - Adenosarcoma, AJCC 8th Edition - Pathologic stage from 06/09/2017: Stage IIIC (pT1a, pN1, cM0) - Signed by Truitt Merle, MD on 06/16/2017  Malignant neoplasm of upper-inner quadrant of right breast in female, estrogen receptor positive ([place]) Staging form: Breast, AJCC 8th Edition - Clinical stage from 05/06/2017: Stage IA (cT1c, cN0, cM0, G2, ER: Positive, PR: Negative, HER2: Positive) - Unsigned - Pathologic stage from 05/25/2017: Stage IIA (pT2, pN0, cM0, G2, ER: Positive, PR: Negative, HER2: Negative) - Signed by Truitt Merle, MD on 06/16/2017   CHIEF COMPLAINT: Here to discuss management of right breast cancer and concurrent endometrial cancer  Narrative:  The patient returns today for follow-up.     Since consultation, she underwent right breast lumpectomy with lymph node biopsy on 05/25/2017. 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 Margins and 3 right axillary lymph nodes were all negative.   She also underwent total laparoscopic hysterectomy with bilateral salpingo oophorectomy on 06/09/2017. 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 Cervix, bilateral fallopian tubes, and bilateral ovaries were all free of tumor. One right external iliac lymph  node and one left external iliac lymph node were both positive for metastatic adenocarcinoma. The left obturator lymph node was negative.  PET scan on 06/26/2017 showed postoperative findings both in the right breast and in the pelvis, with associated low-grade activity considered to be postoperative in nature. No other hypermetabolic adenopathy or lesions identified.  She had genetic testing due to a personal history of breast and uterine cancer as well as a family history of cancer. The results came back negative. She began adjuvant TCH with Onpro every 3 weeks for 6 cycles on 07/01/2017 followed by Herceptin every 3 weeks for 6 months. The regiment was selected to cover for endometrial cancer as well.  I have communicated with Dr Burr Medico and Dr Denman George about her.  Plan to continue chemotherapy with brachytherapy to vaginal cuff.  Hold RT to breast until chemotherapy is complete.  The patient has kindly been referred today for discussion of adjuvant radiotherapy today.  She reports constipation, frequent urination, and weight gain.           ALLERGIES:  is allergic to ciprofloxacin; crestor [rosuvastatin calcium]; and penicillins.  Meds: Current Outpatient Medications  Medication Sig Dispense Refill  . acetaminophen (TYLENOL) 325 MG tablet Take 650 mg by mouth every 6 (six) hours as needed for mild pain or moderate pain.    . Ascorbic Acid (VITAMIN C) 1000 MG tablet Take 1,000 mg by mouth daily.    . calcium carbonate (TUMS - DOSED IN MG ELEMENTAL CALCIUM) 500 MG chewable tablet Chew 2 tablets by mouth 2 (two) times daily as needed for indigestion or heartburn.    . lidocaine-prilocaine (EMLA) cream  Apply to affected area once 30 g 3  . meclizine (ANTIVERT) 25 MG tablet Take 1-2 tablets (25-50 mg total) by mouth 3 (three) times daily as needed for dizziness. (Patient taking differently: Take 25-50 mg by mouth 3 (three) times daily as needed for dizziness (depends on dizziness if takes 1-2 tablets). )  30 tablet 0  . Multiple Vitamin (MULTIVITAMIN) capsule Take 1 capsule by mouth daily.      Marland Kitchen nystatin (MYCOSTATIN/NYSTOP) powder Apply topically 4 (four) times daily. 15 g 0  . olmesartan-hydrochlorothiazide (BENICAR HCT) 20-12.5 MG tablet Take 1 tablet by mouth daily. 90 tablet 2  . ondansetron (ZOFRAN) 8 MG tablet Take 1 tablet (8 mg total) by mouth 2 (two) times daily as needed for refractory nausea / vomiting. Start on day 3 after chemo. 30 tablet 1  . prochlorperazine (COMPAZINE) 10 MG tablet Take 1 tablet (10 mg total) by mouth every 6 (six) hours as needed (Nausea or vomiting). 30 tablet 1  . sodium chloride (MURO 128) 5 % ophthalmic ointment Place 1 application into both eyes 2 (two) times daily.     Marland Kitchen dexamethasone (DECADRON) 4 MG tablet Take 1 tablet (4 mg total) daily by mouth. Take BID X 2 days, then daily until completed. (Patient not taking: Reported on 08/04/2017) 30 tablet 0  . fluconazole (DIFLUCAN) 100 MG tablet Take 2 tablets today, then 1 tablet daily x 6 more days. Hold Pitavastatin while on this. 8 tablet 0  . oxyCODONE-acetaminophen (PERCOCET/ROXICET) 5-325 MG tablet Take 1-2 tablets by mouth every 4 (four) hours as needed (moderate to severe pain). (Patient not taking: Reported on 08/04/2017) 30 tablet 0  . Pitavastatin Calcium (LIVALO) 2 MG TABS Take 3 mg by mouth every evening. Takes 1.5 tablet    . traMADol (ULTRAM) 50 MG tablet Take 1 tablet (50 mg total) every 6 (six) hours as needed by mouth. (Patient not taking: Reported on 08/04/2017) 30 tablet 0   No current facility-administered medications for this encounter.    Review of Systems: as above  Physical Findings:  height is '5\' 3"'$  (1.6 m) and weight is 183 lb (83 kg). Her temperature is 99 F (37.2 C). Her blood pressure is 128/81 and her pulse is 93. Her oxygen saturation is 97%.     General: Alert and oriented, in no acute distress. HEENT: Head is normocephalic. Extraocular movements are intact. Oropharynx is  clear. Neck: Neck is supple, no palpable cervical or supraclavicular lymphadenopathy. Heart: Regular in rate and rhythm with no murmurs, rubs, or gallops. Chest: Clear to auscultation bilaterally, with no rhonchi, wheezes, or rales. Abdomen: Soft, nontender, nondistended, with no rigidity or guarding. Extremities: No cyanosis or edema. Lymphatics: see Neck Exam. Groin negative Musculoskeletal: Symmetric strength and muscle tone throughout. Neurologic: No obvious focalities. Speech is fluent.  Psychiatric: Judgment and insight are intact. Affect is appropriate. Breast exam reveals good healing from surgery Skin: see GYN exam for yeast like rash GYN: ext genitalia and perineum: yeast-like erythematous rash.  Vaginal vault is clear.  Lab Findings: Lab Results  Component Value Date   WBC 4.4 07/22/2017   HGB 12.5 07/22/2017   HCT 36.4 07/22/2017   MCV 97.6 07/22/2017   PLT 256 07/22/2017      Radiographic Findings: No results found.  Impression/Plan: Right Breast Cancer s/p Lumpectomy and Endometrial cancer also post operative  I recommend proceeding with intracavitary  Brachytherapy to the upper vagina, 5 fractions (one per week).  We discussed the risks benefits and side  effects of this.  She is enthusiastic to proceed. Consent signed today.  She was fitted today for the intracavitary cylinder, 3.5 cm width.  Will simulate her treatment next week and start at the end of the week most likely.  Hold Breast RT  (4 weeks of treatment) until after chemotherapy.  _____________________________________   [name], MD  This document serves as a record of services personally performed by [name], MD. It was created on her behalf by Rae Lips, a trained medical scribe. The creation of this record is based on the scribe's personal observations and the provider's statements to them. This document has been checked and approved by the attending provider.

## 2017-08-05 ENCOUNTER — Encounter: Payer: Self-pay | Admitting: Radiation Oncology

## 2017-08-05 NOTE — Progress Notes (Signed)
Rec'd disability forms for Dawn [name] (that I believe were given to nursing on 11/20, late afternoon). I have printed her consult note and will be giving forms back to nursing to complete. 7-10 business days for turnaround.  I will call patient once completed to ask how they would like to finish processing them.

## 2017-08-10 ENCOUNTER — Telehealth: Payer: Self-pay | Admitting: *Deleted

## 2017-08-10 NOTE — Telephone Encounter (Signed)
Called patient to inform of new HDR [facility], spoke with patient and she is aware of these dates and times.

## 2017-08-11 ENCOUNTER — Ambulatory Visit: Payer: 59 | Admitting: Radiation Oncology

## 2017-08-11 NOTE — Progress Notes (Signed)
I received FMLA/ Disability and insurance paperwork from clerical 08/11/17. Will complete to the best of my ability and give to [name] 08/12/17.

## 2017-08-12 ENCOUNTER — Ambulatory Visit (HOSPITAL_BASED_OUTPATIENT_CLINIC_OR_DEPARTMENT_OTHER): Payer: 59

## 2017-08-12 ENCOUNTER — Ambulatory Visit: Payer: 59 | Admitting: Radiation Oncology

## 2017-08-12 ENCOUNTER — Ambulatory Visit: Payer: 59

## 2017-08-12 ENCOUNTER — Ambulatory Visit (HOSPITAL_BASED_OUTPATIENT_CLINIC_OR_DEPARTMENT_OTHER): Payer: 59 | Admitting: Nurse Practitioner

## 2017-08-12 ENCOUNTER — Encounter: Payer: Self-pay | Admitting: Nurse Practitioner

## 2017-08-12 ENCOUNTER — Other Ambulatory Visit (HOSPITAL_BASED_OUTPATIENT_CLINIC_OR_DEPARTMENT_OTHER): Payer: 59

## 2017-08-12 VITALS — BP 125/72 | HR 81 | Temp 98.2°F | Resp 18 | Ht 63.0 in | Wt 183.2 lb

## 2017-08-12 DIAGNOSIS — Z170 Estrogen receptor positive status [ER+]: Secondary | ICD-10-CM

## 2017-08-12 DIAGNOSIS — L270 Generalized skin eruption due to drugs and medicaments taken internally: Secondary | ICD-10-CM

## 2017-08-12 DIAGNOSIS — R42 Dizziness and giddiness: Secondary | ICD-10-CM

## 2017-08-12 DIAGNOSIS — R7989 Other specified abnormal findings of blood chemistry: Secondary | ICD-10-CM | POA: Diagnosis not present

## 2017-08-12 DIAGNOSIS — Z5112 Encounter for antineoplastic immunotherapy: Secondary | ICD-10-CM | POA: Diagnosis not present

## 2017-08-12 DIAGNOSIS — C50211 Malignant neoplasm of upper-inner quadrant of right female breast: Secondary | ICD-10-CM | POA: Diagnosis not present

## 2017-08-12 DIAGNOSIS — Z5111 Encounter for antineoplastic chemotherapy: Secondary | ICD-10-CM | POA: Diagnosis not present

## 2017-08-12 DIAGNOSIS — C541 Malignant neoplasm of endometrium: Secondary | ICD-10-CM

## 2017-08-12 DIAGNOSIS — C775 Secondary and unspecified malignant neoplasm of intrapelvic lymph nodes: Secondary | ICD-10-CM

## 2017-08-12 DIAGNOSIS — B373 Candidiasis of vulva and vagina: Secondary | ICD-10-CM

## 2017-08-12 DIAGNOSIS — I10 Essential (primary) hypertension: Secondary | ICD-10-CM | POA: Diagnosis not present

## 2017-08-12 DIAGNOSIS — Z95828 Presence of other vascular implants and grafts: Secondary | ICD-10-CM

## 2017-08-12 DIAGNOSIS — M898X9 Other specified disorders of bone, unspecified site: Secondary | ICD-10-CM

## 2017-08-12 LAB — COMPREHENSIVE METABOLIC PANEL
ALBUMIN: 3.7 g/dL (ref 3.5–5.0)
ALK PHOS: 51 U/L (ref 40–150)
ALT: 97 U/L — ABNORMAL HIGH (ref 0–55)
AST: 59 U/L — ABNORMAL HIGH (ref 5–34)
Anion Gap: 9 mEq/L (ref 3–11)
BUN: 9.7 mg/dL (ref 7.0–26.0)
CO2: 26 meq/L (ref 22–29)
Calcium: 9.3 mg/dL (ref 8.4–10.4)
Chloride: 103 mEq/L (ref 98–109)
Creatinine: 0.8 mg/dL (ref 0.6–1.1)
GLUCOSE: 116 mg/dL (ref 70–140)
POTASSIUM: 3.4 meq/L — AB (ref 3.5–5.1)
SODIUM: 139 meq/L (ref 136–145)
Total Bilirubin: 0.53 mg/dL (ref 0.20–1.20)
Total Protein: 7 g/dL (ref 6.4–8.3)

## 2017-08-12 LAB — CBC WITH DIFFERENTIAL/PLATELET
BASO%: 0.9 % (ref 0.0–2.0)
BASOS ABS: 0 10*3/uL (ref 0.0–0.1)
EOS ABS: 0 10*3/uL (ref 0.0–0.5)
EOS%: 0.7 % (ref 0.0–7.0)
HCT: 34 % — ABNORMAL LOW (ref 34.8–46.6)
HGB: 11.8 g/dL (ref 11.6–15.9)
LYMPH%: 42.8 % (ref 14.0–49.7)
MCH: 34.4 pg — AB (ref 25.1–34.0)
MCHC: 34.8 g/dL (ref 31.5–36.0)
MCV: 98.7 fL (ref 79.5–101.0)
MONO#: 0.6 10*3/uL (ref 0.1–0.9)
MONO%: 13.3 % (ref 0.0–14.0)
NEUT#: 1.8 10*3/uL (ref 1.5–6.5)
NEUT%: 42.3 % (ref 38.4–76.8)
Platelets: 166 10*3/uL (ref 145–400)
RBC: 3.44 10*6/uL — AB (ref 3.70–5.45)
RDW: 14.5 % (ref 11.2–14.5)
WBC: 4.2 10*3/uL (ref 3.9–10.3)
lymph#: 1.8 10*3/uL (ref 0.9–3.3)

## 2017-08-12 MED ORDER — DIPHENHYDRAMINE HCL 50 MG/ML IJ SOLN
INTRAMUSCULAR | Status: AC
  Filled 2017-08-12: qty 1

## 2017-08-12 MED ORDER — DIPHENHYDRAMINE HCL 25 MG PO CAPS: 50.0000 mg | ORAL_CAPSULE | Freq: Once | ORAL | Status: DC

## 2017-08-12 MED ORDER — DIPHENHYDRAMINE HCL 50 MG/ML IJ SOLN
50.0000 mg | Freq: Once | INTRAMUSCULAR | Status: AC
  Administered 2017-08-12: 50 mg via INTRAVENOUS

## 2017-08-12 MED ORDER — HEPARIN SOD (PORK) LOCK FLUSH 100 UNIT/ML IV SOLN
500.0000 [IU] | Freq: Once | INTRAVENOUS | Status: AC | PRN
Start: 2017-08-12 — End: 2017-08-12
  Administered 2017-08-12: 500 [IU]
  Filled 2017-08-12: qty 5

## 2017-08-12 MED ORDER — SODIUM CHLORIDE 0.9 % IV SOLN
Freq: Once | INTRAVENOUS | Status: AC
  Administered 2017-08-12: 10:00:00 via INTRAVENOUS
  Filled 2017-08-12: qty 5

## 2017-08-12 MED ORDER — ACETAMINOPHEN 325 MG PO TABS
ORAL_TABLET | ORAL | Status: AC
  Filled 2017-08-12: qty 2

## 2017-08-12 MED ORDER — SODIUM CHLORIDE 0.9 % IV SOLN
607.2000 mg | Freq: Once | INTRAVENOUS | Status: AC
  Administered 2017-08-12: 610 mg via INTRAVENOUS
  Filled 2017-08-12: qty 61

## 2017-08-12 MED ORDER — FAMOTIDINE IN NACL 20-0.9 MG/50ML-% IV SOLN
INTRAVENOUS | Status: AC
  Filled 2017-08-12: qty 50

## 2017-08-12 MED ORDER — PALONOSETRON HCL INJECTION 0.25 MG/5ML
0.2500 mg | Freq: Once | INTRAVENOUS | Status: AC
  Administered 2017-08-12: 0.25 mg via INTRAVENOUS

## 2017-08-12 MED ORDER — FAMOTIDINE IN NACL 20-0.9 MG/50ML-% IV SOLN
20.0000 mg | Freq: Once | INTRAVENOUS | Status: AC
  Administered 2017-08-12: 20 mg via INTRAVENOUS

## 2017-08-12 MED ORDER — PALONOSETRON HCL INJECTION 0.25 MG/5ML
INTRAVENOUS | Status: AC
  Filled 2017-08-12: qty 5

## 2017-08-12 MED ORDER — PEGFILGRASTIM 6 MG/0.6ML ~~LOC~~ PSKT
6.0000 mg | PREFILLED_SYRINGE | Freq: Once | SUBCUTANEOUS | Status: AC
  Administered 2017-08-12: 6 mg via SUBCUTANEOUS
  Filled 2017-08-12: qty 0.6

## 2017-08-12 MED ORDER — TRASTUZUMAB CHEMO 150 MG IV SOLR
6.0000 mg/kg | Freq: Once | INTRAVENOUS | Status: AC
  Administered 2017-08-12: 525 mg via INTRAVENOUS
  Filled 2017-08-12: qty 25

## 2017-08-12 MED ORDER — SODIUM CHLORIDE 0.9% FLUSH
10.0000 mL | INTRAVENOUS | Status: DC | PRN
  Administered 2017-08-12: 10 mL via INTRAVENOUS
  Filled 2017-08-12: qty 10

## 2017-08-12 MED ORDER — ACETAMINOPHEN 325 MG PO TABS
650.0000 mg | ORAL_TABLET | Freq: Once | ORAL | Status: AC
  Administered 2017-08-12: 650 mg via ORAL

## 2017-08-12 MED ORDER — SODIUM CHLORIDE 0.9 % IV SOLN
Freq: Once | INTRAVENOUS | Status: AC
  Administered 2017-08-12: 09:00:00 via INTRAVENOUS

## 2017-08-12 MED ORDER — PACLITAXEL CHEMO INJECTION 300 MG/50ML
175.0000 mg/m2 | Freq: Once | INTRAVENOUS | Status: AC
  Administered 2017-08-12: 342 mg via INTRAVENOUS
  Filled 2017-08-12: qty 57

## 2017-08-12 MED ORDER — SODIUM CHLORIDE 0.9% FLUSH
10.0000 mL | INTRAVENOUS | Status: DC | PRN
  Administered 2017-08-12: 10 mL
  Filled 2017-08-12: qty 10

## 2017-08-12 NOTE — Patient Instructions (Signed)
Implanted Port Home Guide An implanted port is a type of central line that is placed under the skin. Central lines are used to provide IV access when treatment or nutrition needs to be given through a person's veins. Implanted ports are used for long-term IV access. An implanted port may be placed because:  You need IV medicine that would be irritating to the small veins in your hands or arms.  You need long-term IV medicines, such as antibiotics.  You need IV nutrition for a long period.  You need frequent blood draws for lab tests.  You need dialysis.  Implanted ports are usually placed in the chest area, but they can also be placed in the upper arm, the abdomen, or the leg. An implanted port has two main parts:  Reservoir. The reservoir is round and will appear as a small, raised area under your skin. The reservoir is the part where a needle is inserted to give medicines or draw blood.  Catheter. The catheter is a thin, flexible tube that extends from the reservoir. The catheter is placed into a large vein. Medicine that is inserted into the reservoir goes into the catheter and then into the vein.  How will I care for my incision site? Do not get the incision site wet. Bathe or shower as directed by your health care provider. How is my port accessed? Special steps must be taken to access the port:  Before the port is accessed, a numbing cream can be placed on the skin. This helps numb the skin over the port site.  Your health care provider uses a sterile technique to access the port. ? Your health care provider must put on a mask and sterile gloves. ? The skin over your port is cleaned carefully with an antiseptic and allowed to dry. ? The port is gently pinched between sterile gloves, and a needle is inserted into the port.  Only "non-coring" port needles should be used to access the port. Once the port is accessed, a blood return should be checked. This helps ensure that the port  is in the vein and is not clogged.  If your port needs to remain accessed for a constant infusion, a clear (transparent) bandage will be placed over the needle site. The bandage and needle will need to be changed every week, or as directed by your health care provider.  Keep the bandage covering the needle clean and dry. Do not get it wet. Follow your health care provider's instructions on how to take a shower or bath while the port is accessed.  If your port does not need to stay accessed, no bandage is needed over the port.  What is flushing? Flushing helps keep the port from getting clogged. Follow your health care provider's instructions on how and when to flush the port. Ports are usually flushed with saline solution or a medicine called heparin. The need for flushing will depend on how the port is used.  If the port is used for intermittent medicines or blood draws, the port will need to be flushed: ? After medicines have been given. ? After blood has been drawn. ? As part of routine maintenance.  If a constant infusion is running, the port may not need to be flushed.  How long will my port stay implanted? The port can stay in for as long as your health care provider thinks it is needed. When it is time for the port to come out, surgery will be   done to remove it. The procedure is similar to the one performed when the port was put in. When should I seek immediate medical care? When you have an implanted port, you should seek immediate medical care if:  You notice a bad smell coming from the incision site.  You have swelling, redness, or drainage at the incision site.  You have more swelling or pain at the port site or the surrounding area.  You have a fever that is not controlled with medicine.  This information is not intended to replace advice given to you by your health care provider. Make sure you discuss any questions you have with your health care provider. Document  Released: 09/01/2005 Document Revised: 02/07/2016 Document Reviewed: 05/09/2013 Elsevier Interactive Patient Education  2017 [employer]  

## 2017-08-12 NOTE — Patient Instructions (Signed)
Combine Discharge Instructions for Patients Receiving Chemotherapy  Today you received the following chemotherapy agents Herceptin, Taxol and Carboplatin  To help prevent nausea and vomiting after your treatment, we encourage you to take your nausea medication as directed.   If you develop nausea and vomiting that is not controlled by your nausea medication, call the clinic.   BELOW ARE SYMPTOMS THAT SHOULD BE REPORTED IMMEDIATELY:  *FEVER GREATER THAN 100.5 F  *CHILLS WITH OR WITHOUT FEVER  NAUSEA AND VOMITING THAT IS NOT CONTROLLED WITH YOUR NAUSEA MEDICATION  *UNUSUAL SHORTNESS OF BREATH  *UNUSUAL BRUISING OR BLEEDING  TENDERNESS IN MOUTH AND THROAT WITH OR WITHOUT PRESENCE OF ULCERS  *URINARY PROBLEMS  *BOWEL PROBLEMS  UNUSUAL RASH Items with * indicate a potential emergency and should be followed up as soon as possible.  Feel free to call the clinic should you have any questions or concerns. The clinic phone number is [phone].  Please show the [place] at check-in to the Emergency Department and triage nurse.

## 2017-08-12 NOTE — Progress Notes (Signed)
[name]  [phone] [phone]  Clinic Follow up Note   Patient Care Team: [name], MD as PCP - Haskell Riling, MD as Consulting Physician (General Surgery) Truitt Merle, MD as Consulting Physician (Hematology) [name], MD as Attending Physician (Radiation Oncology) 08/12/2017  CHIEF COMPLAINT:  F/u right breast cancer and endometrial cancer   SUMMARY OF ONCOLOGIC HISTORY:   Malignant neoplasm of upper-inner quadrant of right breast in female, estrogen receptor positive ([place])   04/27/2017 Initial Biopsy    Diagnosis Breast, right, needle core biopsy INVASIVE DUCTAL CARCINOMA, GRADE 2 Microscopic Comment The neoplasm has intracellular mucin and signet ring cell features. Immunostains shows these cells are positive for ER,GATA3, ck7 and GCDFP, negative for ck20, cdx2, TTF-1 and pax8, The immunostaining pattern supports the neoplasm is breast primary.      04/27/2017 Receptors her2    Estrogen Receptor: 70%, POSITIVE, MODERATE STAINING INTENSITY Progesterone Receptor: 0%, NEGATIVE Proliferation Marker Ki67: 30% HER2 - **POSITIVE** RATIO OF HER2/CEP17 SIGNALS 2.91 AVERAGE HER2 COPY NUMBER PER CELL 7.28      04/27/2017 Initial Diagnosis    Malignant neoplasm of upper-inner quadrant of right breast in female, estrogen receptor positive ([name])       Mammogram         05/07/2017 Imaging    CT Chest W Contrast 05/07/17 IMPRESSION: Tiny well-defined fatty lesion on the pleura at the right lung base. The thinner slice collimation used for today's chest CT eliminates volume-averaging seen in the lesion on the prior exam and confirms that this is a diffusely fatty nodule. This is a benign finding and likely represents a tiny lipoma. No defect in the hemidiaphragm evident to suggest tiny diaphragmatic hernia. Pulmonary hamartoma a consideration although the lack of soft tissue components makes this less likely.      05/15/2017 Genetic Testing    Patient had genetic testing due to a personal history of breast cancer and uterine cancer as well as a family history of cancer. The Multi-Cancer panel was ordered. The Multi-Cancer Panel offered by [facility] includes sequencing and/or deletion duplication testing of the following 83 genes: ALK, APC, ATM, AXIN2,BAP1,  BARD1, BLM, BMPR1A, BRCA1, BRCA2, BRIP1, CASR, CDC73, CDH1, CDK4, CDKN1B, CDKN1C, CDKN2A (p14ARF), CDKN2A (p16INK4a), CEBPA, CHEK2, CTNNA1, DICER1, DIS3L2, EGFR (c.2369C>T, p.Thr790Met variant only), EPCAM (Deletion/duplication testing only), FH, FLCN, GATA2, GPC3, GREM1 (Promoter region deletion/duplication testing only), HOXB13 (c.251G>A, p.Gly84Glu), HRAS, KIT, MAX, MEN1, MET, MITF (c.952G>A, p.Glu318Lys variant only), MLH1, MSH2, MSH3, MSH6, MUTYH, NBN, NF1, NF2, NTHL1, PALB2, PDGFRA, PHOX2B, PMS2, POLD1, POLE, POT1, PRKAR1A, PTCH1, PTEN, RAD50, RAD51C, RAD51D, RB1, RECQL4, RET, RUNX1, SDHAF2, SDHA (sequence changes only), SDHB, SDHC, SDHD, SMAD4, SMARCA4, SMARCB1, SMARCE1, STK11, SUFU, TERC, TERT, TMEM127, TP53, TSC1, TSC2, VHL, WRN and WT1.   Results: No pathogenic mutations were identified. A VUS in the GATA2 gene c.1348G>A (p.Gly450Arg) was identified.  The date of this test report is 05/25/2017.        05/25/2017 Surgery    RIGHT BREAST LUMPECTOMY WITH RADIOACTIVE SEED AND  RIGHT AXILLARY SENTINEL LYMPH NODE BIOPSY and Pot placement by Dr. Excell Seltzer 05/25/17      05/25/2017 Pathology Results    Diagnosis 05/25/17 1. Breast, lumpectomy, right - INVASIVE DUCTAL CARCINOMA, GRADE II/III, SPANNING 2.2 CM. - DUCTAL CARCINOMA IN SITU, HIGH GRADE. - THE SURGICAL RESECTION MARGINS ARE NEGATIVE FOR CARCINOMA. - SEE ONCOLOGY TABLE BELOW. 2. Breast, excision, right additional superior margin - BENIGN FIBROADIPOSE TISSUE. - BENIGN SKELETAL MUSCLE. - SEE COMMENT.  3. Breast, excision, right additional lateral margin - BENIGN BREAST PARENCHYMA. - THERE IS  NO EVIDENCE OF MALIGNANCY. - SEE COMMENT. 4. Breast, excision, chest wall margin - BENIGN FIBROADIPOSE TISSUE. - THERE IS NO EVIDENCE OF MALIGNANCY. - SEE COMMENT. 5. Lymph node, sentinel, biopsy, right axillary - THERE IS NO EVIDENCE OF CARCINOMA IN 1 OF 1 LYMPH NODE (0/1). 6. Lymph node, sentinel, biopsy, right axillary - THERE IS NO EVIDENCE OF CARCINOMA IN 1 OF 1 LYMPH NODE (0/1). 7. Lymph node, sentinel, biopsy, right axillary - THERE IS NO EVIDENCE OF CARCINOMA IN 1 OF 1 LYMPH NODE (0/1).      06/26/2017 PET scan    PET  IMPRESSION: 1. Postoperative findings both in the right breast and in the anatomic pelvis, with associated low-grade activity considered to be postoperative in nature. No hypermetabolic adenopathy or hypermetabolic lesions are identified to suggest active metastatic disease/malignancy. 2. Other imaging findings of potential clinical significance: Aortic Atherosclerosis (ICD10-I70.0). Sigmoid colon diverticulosis. Biapical pleuroparenchymal scarring in the lungs. Small amount of free pelvic fluid in the cul-de-sac, likely postoperative.       07/01/2017 -  Chemotherapy    Adjuvant TCH with Onpro every 3 weeks for 6 cycles starting on 07/01/17 followed by Herceptin every 3 weeks for 6 months        Endometrial adenocarcinoma ([place])   05/07/2017 Initial Diagnosis    Endometrial adenocarcinoma ([name])      06/09/2017 Surgery    XI ROBOTIC ASSISTED TOTAL LAPOROSCOPIC HYSTERECTOMY WITH BILATERAL SALPINGO OOPHORECTOMY and SENTINEL NODE BIOPSY by Dr. Denman George 06/09/17      06/09/2017 Pathology Results    Diagnosis 06/09/17 1. Lymph node, sentinel, biopsy, right external iliac - METASTATIC ADENOCARCINOMA IN ONE LYMPH NODE (1/1). 2. Lymph node, sentinel, biopsy, left obturator - ONE BENIGN LYMPH NODE (0/1). 3. Lymph node, sentinel, biopsy, left external iliac - METASTATIC ADENOCARCINOMA IN ONE LYMPH NODE (1/1). 4. Uterus +/- tubes/ovaries,  neoplastic ENDOMETRIUM: - ENDOMETRIAL ADENOCARCINOMA, 3.2 CM. - CARCINOMA INVADES INNER HALF OF MYOMETRIUM. 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 - CERVIX, BILATERAL FALLOPIAN TUBES AND BILATERAL OVARIES FREE OF TUMOR     CURRENT THERAPY: Adjuvant TCH with Onpro every 3 weeks for 6 cycles starting 07/01/17 followed by Herceptin every 3 weeks for total of 12 months   INTERVAL HISTORY: [name] returns today for follow-up as scheduled prior to cycle 3 TCH.  Has been to the beach recently with her daughter.  She plans to start GYN brachytherapy next week with [name].  She is completing medication for vaginal yeast infection, symptoms of itching and redness are improving.  She sleeps with groin open to air at night.  She tolerated cycle 2 TCH well overall, mild bone pain controlled with Tylenol and Advil as needed, no skin rash on p.o. Decadron, and mild constipation, occasionally with no BM in 3 days.  She takes stool softener and eats prunes when constipated.  She takes Zofran and Compazine prophylactically for 3 days after chemo to prevent nausea and vomiting.  She has a broken tooth but has not seen a dentist yet, it does not cause her pain and she feels this can wait.  She has good appetite, denies fatigue, fever, or chills.   REVIEW OF SYSTEMS:   Constitutional: Denies fatigue, fevers, chills or abnormal weight loss (+) good appetite Eyes: Denies blurriness of vision Ears, nose, mouth, throat, and face: Denies mucositis or sore throat (+) broken tooth, not painful Respiratory: Denies cough or wheezes (+)  mild DOE, improves quickly with rest Cardiovascular: Denies palpitation, chest discomfort or lower extremity swelling Gastrointestinal:  Denies nausea, vomiting, diarrhea, heartburn or change in bowel habits (+) takes Zofran and Compazine at times 3 days after chemo to prevent n/v (+) intermittent constipation, will eat prunes and use stool softener if no BM in 3 days GU: (+)  Vaginal itching, skin redness to groin, improving; to complete Diflucan tonight Skin: Denies abnormal skin rashes, took Decadron 1 tablet twice daily on day 1 after chemo then daily times 5 days with cycle 2 this prevented rash Lymphatics: Denies new lymphadenopathy or easy bruising Neurological:Denies numbness, tingling or new weaknesses Behavioral/Psych: Mood is stable, no new changes  Breast: denies changes to breasts or incisions  All other systems were reviewed with the patient and are negative.  MEDICAL HISTORY:  Past Medical History:  Diagnosis Date  . Anemia    as a child.  . Arthritis   . Bilateral cataracts   . Bilateral leg cramps   . Cancer ([place])    skin  . Dyspnea   . Family history of bladder cancer   . Family history of colon cancer in father   . Fuchs' corneal dystrophy   . GERD (gastroesophageal reflux disease)   . Hearing loss    Right side 30%  . History of hiatal hernia    small size  . Hyperlipidemia   . Hypertension   . IBS (irritable bowel syndrome)    hx of  . Malignant neoplasm of upper-inner quadrant of right female breast ([place])   . NAFL (nonalcoholic fatty liver)   . Obesity   . PONV (postoperative nausea and vomiting)   . 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 Uterine cancer ([place])    endometrial cancer  . Varices, gastric   . Vertigo     SURGICAL HISTORY: Past Surgical History:  Procedure Laterality Date  . BREAST BIOPSY Right 04/27/2017  . BREAST LUMPECTOMY WITH RADIOACTIVE SEED AND SENTINEL LYMPH NODE BIOPSY Right 05/25/2017   Procedure: RIGHT BREAST LUMPECTOMY WITH RADIOACTIVE SEED AND  RIGHT AXILLARY SENTINEL LYMPH NODE BIOPSY;  Surgeon: Excell Seltzer, MD;  Location: [facility];  Service: General;  Laterality: Right;  . COLONOSCOPY    . HYSTEROSCOPY W/D&C N/A 04/29/2017   Procedure: DILATATION AND CURETTAGE /HYSTEROSCOPY;  Surgeon: Linda Hedges, DO;  Location: [facility];  Service:  Gynecology;  Laterality: N/A;  . PORTACATH PLACEMENT Left 05/25/2017   Procedure: INSERTION PORT-A-CATH;  Surgeon: Excell Seltzer, MD;  Location: [facility];  Service: General;  Laterality: Left;  . ROBOTIC ASSISTED TOTAL HYSTERECTOMY WITH BILATERAL SALPINGO OOPHERECTOMY N/A 06/09/2017   Procedure: XI ROBOTIC ASSISTED TOTAL LAPOROSCOPIC HYSTERECTOMY WITH BILATERAL SALPINGO OOPHORECTOMY;  Surgeon: Everitt Amber, MD;  Location: [facility];  Service: Gynecology;  Laterality: N/A;  . SENTINEL NODE BIOPSY N/A 06/09/2017   Procedure: SENTINEL NODE BIOPSY;  Surgeon: Everitt Amber, MD;  Location: [facility];  Service: Gynecology;  Laterality: N/A;    I have reviewed the social history and family history with the patient and they are unchanged from previous note.  ALLERGIES:  is allergic to ciprofloxacin; crestor [rosuvastatin calcium]; and penicillins.  MEDICATIONS:  Current Outpatient Medications  Medication Sig Dispense Refill  . acetaminophen (TYLENOL) 325 MG tablet Take 650 mg by mouth every 6 (six) hours as needed for mild pain or moderate pain.    . Ascorbic Acid (VITAMIN C) 1000 MG tablet Take 1,000 mg by  mouth daily.    . calcium carbonate (TUMS - DOSED IN MG ELEMENTAL CALCIUM) 500 MG chewable tablet Chew 2 tablets by mouth 2 (two) times daily as needed for indigestion or heartburn.    . dexamethasone (DECADRON) 4 MG tablet Take 1 tablet (4 mg total) daily by mouth. Take BID X 2 days, then daily until completed. 30 tablet 0  . fluconazole (DIFLUCAN) 100 MG tablet Take 2 tablets today, then 1 tablet daily x 6 more days. Hold Pitavastatin while on this. 8 tablet 0  . lidocaine-prilocaine (EMLA) cream Apply to affected area once 30 g 3  . meclizine (ANTIVERT) 25 MG tablet Take 1-2 tablets (25-50 mg total) by mouth 3 (three) times daily as needed for dizziness. (Patient taking differently: Take 25-50 mg by mouth 3 (three) times daily as needed for dizziness (depends on dizziness if takes 1-2  tablets). ) 30 tablet 0  . Multiple Vitamin (MULTIVITAMIN) capsule Take 1 capsule by mouth daily.      Marland Kitchen olmesartan-hydrochlorothiazide (BENICAR HCT) 20-12.5 MG tablet Take 1 tablet by mouth daily. 90 tablet 2  . Pitavastatin Calcium (LIVALO) 2 MG TABS Take 3 mg by mouth every evening. Takes 1.5 tablet    . sodium chloride (MURO 128) 5 % ophthalmic ointment Place 1 application into both eyes 2 (two) times daily.     Marland Kitchen nystatin (MYCOSTATIN/NYSTOP) powder Apply topically 4 (four) times daily. (Patient not taking: Reported on 08/12/2017) 15 g 0  . ondansetron (ZOFRAN) 8 MG tablet Take 1 tablet (8 mg total) by mouth 2 (two) times daily as needed for refractory nausea / vomiting. Start on day 3 after chemo. (Patient not taking: Reported on 08/12/2017) 30 tablet 1  . oxyCODONE-acetaminophen (PERCOCET/ROXICET) 5-325 MG tablet Take 1-2 tablets by mouth every 4 (four) hours as needed (moderate to severe pain). (Patient not taking: Reported on 08/04/2017) 30 tablet 0  . prochlorperazine (COMPAZINE) 10 MG tablet Take 1 tablet (10 mg total) by mouth every 6 (six) hours as needed (Nausea or vomiting). (Patient not taking: Reported on 08/12/2017) 30 tablet 1  . traMADol (ULTRAM) 50 MG tablet Take 1 tablet (50 mg total) every 6 (six) hours as needed by mouth. (Patient not taking: Reported on 08/04/2017) 30 tablet 0   No current facility-administered medications for this visit.     PHYSICAL EXAMINATION: ECOG PERFORMANCE STATUS: 1 - Symptomatic but completely ambulatory  Vitals:   08/12/17 0810  BP: 125/72  Pulse: 81  Resp: 18  Temp: 98.2 F (36.8 C)  SpO2: 99%   Filed Weights   08/12/17 0810  Weight: 183 lb 3.2 oz (83.1 kg)    GENERAL:alert, no distress and comfortable SKIN: skin color, texture, turgor are normal, no rashes or significant lesions EYES: normal, Conjunctiva are pink and non-injected, sclera clear OROPHARYNX:no exudate, no erythema and lips, buccal mucosa, and tongue normal  NECK:  supple, thyroid normal size, non-tender, without nodularity LYMPH:  no palpable cervical, supraclavicular, or axillary lymphadenopathy  LUNGS: clear to auscultation bilaterally with normal breathing effort HEART: regular rate & rhythm and (+) aortic murmur (+) trace lower extremity edema ABDOMEN:abdomen soft, non-tender and normal bowel sounds. (+) Status post total hysterectomy and salpingo-oophorectomy, 4 horizontal laparoscopic surgical incisions at the umbilicus are well-healed Musculoskeletal:no cyanosis of digits and no clubbing  NEURO: alert & oriented x 3 with fluent speech, no focal motor/sensory deficits Breasts: Status post right lumpectomy, surgical incisions in right axilla and areola are well-healed, no skin erythema or discharge PAC without erythema  LABORATORY DATA:  I have reviewed the data as listed CBC Latest Ref Rng & Units 08/12/2017 07/22/2017 07/01/2017  WBC 3.9 - 10.3 10e3/uL 4.2 4.4 4.6  Hemoglobin 11.6 - 15.9 g/dL 11.8 12.5 13.5  Hematocrit 34.8 - 46.6 % 34.0(L) 36.4 39.1  Platelets 145 - 400 10e3/uL 166 256 212     CMP Latest Ref Rng & Units 08/12/2017 07/22/2017 07/01/2017  Glucose 70 - 140 mg/dl 116 165(H) 130  BUN 7.0 - 26.0 mg/dL 9.7 7.8 8.5  Creatinine 0.6 - 1.1 mg/dL 0.8 0.8 0.8  Sodium 136 - 145 mEq/L 139 137 140  Potassium 3.5 - 5.1 mEq/L 3.4(L) 3.5 3.5  Chloride 101 - 111 mmol/L - - -  CO2 22 - 29 mEq/L '26 25 26  '$ Calcium 8.4 - 10.4 mg/dL 9.3 9.1 9.5  Total Protein 6.4 - 8.3 g/dL 7.0 7.0 7.1  Total Bilirubin 0.20 - 1.20 mg/dL 0.53 0.51 0.65  Alkaline Phos 40 - 150 U/L 51 58 46  AST 5 - 34 U/L 59(H) 56(H) 46(H)  ALT 0 - 55 U/L 97(H) 137(H) 88(H)    RADIOGRAPHIC STUDIES: I have personally reviewed the radiological images as listed and agreed with the findings in the report. No results found.   ASSESSMENT & PLAN: 66 y.o. 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  Malignant neoplasm of upper inner quadrant of right breast , Invasive ductal carcinoma, pT2N0M0, G2,  ER+/PR-/HER2+ 2. Endometrial adenocarcinoma with lymph node metastasis, pT1ApN1M0, FIGO stage IIIc 3. Genetics 4. HTN 5. Vertigo  6. Insurance and emotional support 7. Elevated LFTs 8. Drug rash 9. Bone pain from Onpro 10. Vaginal yeast infection  [name] appears stable today.  She tolerated cycle 2 TCH well overall with mild bone pain controlled with Claritin, Tylenol, and Advil as needed.  She has tramadol prescription but has not required this level of pain medicine. She controls intermittent constipation with prunes and stool softener as needed. We discussed keeping bowel moving regularly q1-2 days. She took oral Decadron 2 tablets twice daily for first day then daily times 5 days after chemo, no drug rash.  She will shorten steroid course to 1 tablet daily times 3 days after chemo, if she remains free of skin rash will consider discontinuing steroids with next cycle.  On Diflucan for vaginal yeast infection, will complete tonight; symptoms nearly resolved.  Is planned for vaginal brachytherapy per [name] on 12/4, 12/10, 12/14, and 12/18.  Breast radiation to follow chemotherapy.  She appears to be tolerating [place] chemo well overall, vital signs and weight stable, labs adequate for treatment today.  LFTs remain mildly elevated but stable overall, elevated prior to chemo and will monitor closely. She will proceed with cycle 3 TCH with on prior today, return in 3 weeks for cycle 4.  Baseline echo 05/11/17 with EF 65-70%, mild LVH. Next echo and follow-up with Dr. Aundra Dubin prior to cycle 4, I scheduled these appointments today; she is aware.  PLAN Calritin, tylenol, advil PRN for bone pain, tramadol if not sufficient Maintain BM q1-2 days, prunes and stool softener PRN Skin rash prophylaxis, decadron 4 mg 1 tablet daily x3 days after chemo; will likely d/c with next cycle if no rash  Complete  diflucan for vaginal yeast infection, last dose today Vaginal brachytherapy per [name] 12/4, 12/10, 12/14, 12/18 Echo 12/13, f/u Dr. Aundra Dubin 12/20; appts given to patient today Labs reviewed, proceed  with cycle 3 TCH today Return in 3 weeks for cycle 4   All questions were answered. The patient knows to call the clinic with any problems, questions or concerns. No barriers to learning was detected.     Alla Feeling, NP 08/12/17

## 2017-08-12 NOTE — Progress Notes (Signed)
Received okay from [facility] to treat today with ALT 97 and echo scheduled.

## 2017-08-14 ENCOUNTER — Encounter: Payer: Self-pay | Admitting: Radiation Oncology

## 2017-08-14 ENCOUNTER — Other Ambulatory Visit (HOSPITAL_COMMUNITY): Payer: 59

## 2017-08-17 ENCOUNTER — Telehealth: Payer: Self-pay

## 2017-08-17 ENCOUNTER — Telehealth: Payer: Self-pay | Admitting: *Deleted

## 2017-08-17 ENCOUNTER — Other Ambulatory Visit: Payer: Self-pay | Admitting: Hematology

## 2017-08-17 NOTE — Telephone Encounter (Signed)
I will see if her insurance will approve Abraxane to replace Taxol, if approved, she will not need to take dexa the day before next cycle chemo. Please let her know and check with [name] 2 days before next chemo.   Truitt Merle MD

## 2017-08-17 NOTE — Telephone Encounter (Signed)
CALLED PATIENT TO REMIND OF SIM AND HDR TX. FOR 08-18-17, SPOKE WITH PATIENT AND SHE IS AWARE OF THESE APPTS.

## 2017-08-17 NOTE — Telephone Encounter (Signed)
Pt started rash on Sunday. Her chemo was last Wednesday. She was taking her 1 decadron daily for  3 days post chemo. She self medicated on Sunday by taking  2 decadron on Sunday and now 1 daily. She is taking pepcid 20 mg bid and benadryl 25 mg every 6 hours.  Rash is under her breast on torso to upper legs. Red and itchy. The pepcid, decadron, and benadryl are helping. she feels she needs a few more days to get rid of rash. She does not feel she needs to be seen. Not as bad as first time she had rash with C1  She starts xrt tomorrow, internal vaginal xrt.  She s/w Dr Lanell Persons RN who instructed her to keep Dr Burr Medico informed.

## 2017-08-17 NOTE — Telephone Encounter (Signed)
S/w pt per dr Ernestina Penna message.

## 2017-08-18 ENCOUNTER — Encounter: Payer: Self-pay | Admitting: Radiation Oncology

## 2017-08-18 ENCOUNTER — Ambulatory Visit
Admission: RE | Admit: 2017-08-18 | Discharge: 2017-08-18 | Disposition: A | Discharge status: Home / Self Care | Payer: 59 | Source: Ambulatory Visit | Attending: Radiation Oncology | Admitting: Radiation Oncology

## 2017-08-18 DIAGNOSIS — C541 Malignant neoplasm of endometrium: Secondary | ICD-10-CM | POA: Diagnosis not present

## 2017-08-18 DIAGNOSIS — C50211 Malignant neoplasm of upper-inner quadrant of right female breast: Secondary | ICD-10-CM | POA: Diagnosis not present

## 2017-08-18 NOTE — Progress Notes (Signed)
Faxed to [facility] the provider statement from [name] on disability. The number to fax was [phone]. Originals are scanned into pt chart.

## 2017-08-18 NOTE — Progress Notes (Signed)
Asked to see patient regarding possible vaginal cuff dehiscence. Cylinder advanced more with afternoon placement.   Speculum exam performed. In tact vaginal cuff identified with in tact fornices. Palpably and visibly in tact with all suture dissolved. No bleeding.  Impression:  No cuff dehiscence. Feel that it is safe to proceed with radiation as planned.  Donaciano Eva, MD 08/18/2017

## 2017-08-19 NOTE — Progress Notes (Signed)
CT simulation and treatment planning note Outpatient  Diagnosis: endometrial cancer    ICD-10-CM   1. Endometrial cancer ([place]) C54.1    Earlier today, CT simulation took place without incident.  However, at the HDR suite later this day, the xrays showed the cylinder to be in a different position (more superior/cranial) than at simulation.  Therefore, a repeat simulation was performed - see below.  Dr. Denman George and I reviewed the images together and examined the patient by speculum.  The vaginal cuff was intact.  The vaginal vault appeared to be fairly wide with sagging tissue.  It was felt that at the first simulation this AM, the cylinder may have gone slightly diagonally and reached a cul de sac before ending up at the apex of the vaginal vault.  This afternoon's simulation below assured proper positioning of the device for tx planning.   The patient was taken to the CT simulator and laid in the supine position.   The custom designed vaginal cylinder of 3.5cm width  was fitted in place. Fiducials were placed within the vaginal cylinder.   High-resolution CT axial imaging was obtained of the patient's pelvis. I determined that the images were good for treatment planning.  I plan to deliver vaginal brachytherapy, high-dose-rate intracavitary radiotherapy. I will treat the upper 4 cm of the vagina. 6 Pearline Cables will be prescribed to the mucosal surface and this will be delivered x 5 fractions.    -----------------------------------  [name], MD

## 2017-08-21 ENCOUNTER — Ambulatory Visit
Admission: RE | Admit: 2017-08-21 | Discharge: 2017-08-21 | Disposition: A | Discharge status: Home / Self Care | Payer: 59 | Source: Ambulatory Visit | Attending: Radiation Oncology | Admitting: Radiation Oncology

## 2017-08-21 ENCOUNTER — Encounter: Payer: Self-pay | Admitting: Radiation Oncology

## 2017-08-21 DIAGNOSIS — C541 Malignant neoplasm of endometrium: Secondary | ICD-10-CM | POA: Diagnosis not present

## 2017-08-21 DIAGNOSIS — C50211 Malignant neoplasm of upper-inner quadrant of right female breast: Secondary | ICD-10-CM | POA: Diagnosis not present

## 2017-08-24 ENCOUNTER — Encounter: Payer: Self-pay | Admitting: Radiation Oncology

## 2017-08-27 ENCOUNTER — Encounter (HOSPITAL_COMMUNITY): Payer: Self-pay | Admitting: Cardiology

## 2017-08-27 ENCOUNTER — Other Ambulatory Visit (HOSPITAL_COMMUNITY): Payer: 59

## 2017-08-27 ENCOUNTER — Other Ambulatory Visit (HOSPITAL_COMMUNITY): Payer: Self-pay | Admitting: *Deleted

## 2017-08-27 ENCOUNTER — Ambulatory Visit (HOSPITAL_BASED_OUTPATIENT_CLINIC_OR_DEPARTMENT_OTHER)
Admission: RE | Admit: 2017-08-27 | Discharge: 2017-08-27 | Disposition: A | Discharge status: Home / Self Care | Payer: 59 | Source: Ambulatory Visit | Attending: Cardiology | Admitting: Cardiology

## 2017-08-27 ENCOUNTER — Ambulatory Visit (HOSPITAL_COMMUNITY)
Admission: RE | Admit: 2017-08-27 | Discharge: 2017-08-27 | Disposition: A | Discharge status: Home / Self Care | Payer: 59 | Source: Ambulatory Visit | Attending: Family Medicine | Admitting: Family Medicine

## 2017-08-27 VITALS — BP 127/70 | HR 88 | Wt 187.8 lb

## 2017-08-27 DIAGNOSIS — Z825 Family history of asthma and other chronic lower respiratory diseases: Secondary | ICD-10-CM | POA: Insufficient documentation

## 2017-08-27 DIAGNOSIS — Z8249 Family history of ischemic heart disease and other diseases of the circulatory system: Secondary | ICD-10-CM | POA: Insufficient documentation

## 2017-08-27 DIAGNOSIS — Z803 Family history of malignant neoplasm of breast: Secondary | ICD-10-CM | POA: Diagnosis not present

## 2017-08-27 DIAGNOSIS — I5030 Unspecified diastolic (congestive) heart failure: Secondary | ICD-10-CM | POA: Insufficient documentation

## 2017-08-27 DIAGNOSIS — Z823 Family history of stroke: Secondary | ICD-10-CM | POA: Insufficient documentation

## 2017-08-27 DIAGNOSIS — C541 Malignant neoplasm of endometrium: Secondary | ICD-10-CM | POA: Diagnosis not present

## 2017-08-27 DIAGNOSIS — Z8049 Family history of malignant neoplasm of other genital organs: Secondary | ICD-10-CM | POA: Diagnosis not present

## 2017-08-27 DIAGNOSIS — Z170 Estrogen receptor positive status [ER+]: Secondary | ICD-10-CM

## 2017-08-27 DIAGNOSIS — C50211 Malignant neoplasm of upper-inner quadrant of right female breast: Secondary | ICD-10-CM

## 2017-08-27 DIAGNOSIS — Z853 Personal history of malignant neoplasm of breast: Secondary | ICD-10-CM | POA: Diagnosis not present

## 2017-08-27 DIAGNOSIS — I10 Essential (primary) hypertension: Secondary | ICD-10-CM | POA: Diagnosis present

## 2017-08-27 DIAGNOSIS — E785 Hyperlipidemia, unspecified: Secondary | ICD-10-CM | POA: Diagnosis not present

## 2017-08-27 DIAGNOSIS — I420 Dilated cardiomyopathy: Secondary | ICD-10-CM | POA: Insufficient documentation

## 2017-08-27 DIAGNOSIS — Z8052 Family history of malignant neoplasm of bladder: Secondary | ICD-10-CM | POA: Diagnosis not present

## 2017-08-27 DIAGNOSIS — Z800 Family history of malignant neoplasm of digestive organs: Secondary | ICD-10-CM | POA: Diagnosis not present

## 2017-08-27 DIAGNOSIS — Z8542 Personal history of malignant neoplasm of other parts of uterus: Secondary | ICD-10-CM | POA: Diagnosis not present

## 2017-08-27 DIAGNOSIS — Z79899 Other long term (current) drug therapy: Secondary | ICD-10-CM | POA: Insufficient documentation

## 2017-08-27 DIAGNOSIS — K219 Gastro-esophageal reflux disease without esophagitis: Secondary | ICD-10-CM | POA: Insufficient documentation

## 2017-08-27 DIAGNOSIS — Z833 Family history of diabetes mellitus: Secondary | ICD-10-CM | POA: Insufficient documentation

## 2017-08-27 DIAGNOSIS — Z9889 Other specified postprocedural states: Secondary | ICD-10-CM | POA: Insufficient documentation

## 2017-08-27 DIAGNOSIS — Z90710 Acquired absence of both cervix and uterus: Secondary | ICD-10-CM | POA: Insufficient documentation

## 2017-08-27 DIAGNOSIS — Z90722 Acquired absence of ovaries, bilateral: Secondary | ICD-10-CM | POA: Diagnosis not present

## 2017-08-27 DIAGNOSIS — I110 Hypertensive heart disease with heart failure: Secondary | ICD-10-CM | POA: Insufficient documentation

## 2017-08-27 DIAGNOSIS — Z809 Family history of malignant neoplasm, unspecified: Secondary | ICD-10-CM | POA: Diagnosis not present

## 2017-08-27 DIAGNOSIS — Z841 Family history of disorders of kidney and ureter: Secondary | ICD-10-CM | POA: Insufficient documentation

## 2017-08-27 NOTE — Progress Notes (Signed)
Oncologist: [name]  66 yo with history of HTN and hyperlipidemia with newly-diagnosed breast cancer and endometrial cancer. She was referred to cardio-oncology clinic by [name].   Breast cancer was diagnosed on the right in 8/18, ER+/PR+/HER2+. She has had lumpectomy and is getting TCH x 6 cycles to be followed by Herceptin for 6 more months.   Endometrial cancer was also diagnosed in 8/18.  She has had hysterectomy/BSO in 9/18. She is getting radiation currently.   She is generally doing well with her treatment.  She does not have significant exertional dyspnea or chest pain.  No palpitations.  She has occasional episodes of dyspnea at rest that will last for a few seconds, no particular trigger.   PMH: 1. GERD 2. HTN 3. Hyperlipidemia 4. Endometrial cancer: Diagnosed 8/18. Hysterectomy/BSO in 9/18.  5. Breast cancer: Diagnosed on the right in 8/18, ER+/PR-/HER2+.  S/p lumpectomy.  Plan for [place] x 6 cycles then Herceptin for 6 more months.  - Echo (8/18): EF 65-70%, GLS -18.2%, mild LVH.  - Echo (12/18): EF 60-65%, GLS -18.6%, normal RV size and systolic function.   Social History   Socioeconomic History  . Marital status: Married    Spouse name: Not on file  . Number of children: Not on file  . Years of education: Not on file  . Highest education level: Not on file  Social Needs  . Financial resource strain: Not on file  . Food insecurity - worry: Not on file  . Food insecurity - inability: Not on file  . Transportation needs - medical: Not on file  . Transportation needs - non-medical: Not on file  Occupational History    Employer: [employer]    Comment: [employer]   Tobacco Use  . Smoking status: Never Smoker  . Smokeless tobacco: Never Used  Substance and Sexual Activity  . Alcohol use: Yes    Alcohol/week: 0.0 oz    Comment: <1/week wine or beer occasional  . Drug use: No  . Sexual activity: Yes    Birth control/protection: Post-menopausal   Other Topics Concern  . Not on file  Social History Narrative   2-3 caffeine drinks per day. Regular exercise.  Walking 3 miles a day.    Family History  Problem Relation Age of Onset  . Colon cancer Father 66  . Heart attack Father 71  . Prostate cancer Father        dx 5's  . Bladder Cancer Father 59  . Stroke Mother 62  . Diabetes Maternal Grandfather   . Kidney disease Maternal Grandfather   . Asthma Brother   . Cancer Paternal Grandmother 50       GYN cancer ( thinks ovarian, maybe uterine)  . Heart attack Maternal Grandmother 70  . Breast cancer Other 49  . Colon cancer Other 24  . Cancer Other        type unk, age dx unk   ROS: All systems reviewed and negative except as per HPI.   Current Outpatient Medications  Medication Sig Dispense Refill  . acetaminophen (TYLENOL) 325 MG tablet Take 650 mg by mouth every 6 (six) hours as needed for mild pain or moderate pain.    . Ascorbic Acid (VITAMIN C) 1000 MG tablet Take 1,000 mg by mouth daily.    . calcium carbonate (TUMS - DOSED IN MG ELEMENTAL CALCIUM) 500 MG chewable tablet Chew 2 tablets by mouth 2 (two) times daily as needed for  indigestion or heartburn.    . dexamethasone (DECADRON) 4 MG tablet Take 1 tablet (4 mg total) daily by mouth. Take BID X 2 days, then daily until completed. 30 tablet 0  . fluconazole (DIFLUCAN) 100 MG tablet Take 2 tablets today, then 1 tablet daily x 6 more days. Hold Pitavastatin while on this. 8 tablet 0  . lidocaine-prilocaine (EMLA) cream Apply to affected area once 30 g 3  . meclizine (ANTIVERT) 25 MG tablet Take 1-2 tablets (25-50 mg total) by mouth 3 (three) times daily as needed for dizziness. (Patient taking differently: Take 25-50 mg by mouth 3 (three) times daily as needed for dizziness (depends on dizziness if takes 1-2 tablets). ) 30 tablet 0  . Multiple Vitamin (MULTIVITAMIN) capsule Take 1 capsule by mouth daily.      Marland Kitchen nystatin (MYCOSTATIN/NYSTOP) powder Apply topically 4  (four) times daily. 15 g 0  . olmesartan-hydrochlorothiazide (BENICAR HCT) 20-12.5 MG tablet Take 1 tablet by mouth daily. 90 tablet 2  . ondansetron (ZOFRAN) 8 MG tablet Take 1 tablet (8 mg total) by mouth 2 (two) times daily as needed for refractory nausea / vomiting. Start on day 3 after chemo. 30 tablet 1  . oxyCODONE-acetaminophen (PERCOCET/ROXICET) 5-325 MG tablet Take 1-2 tablets by mouth every 4 (four) hours as needed (moderate to severe pain). 30 tablet 0  . Pitavastatin Calcium (LIVALO) 2 MG TABS Take 3 mg by mouth every evening. Takes 1.5 tablet    . prochlorperazine (COMPAZINE) 10 MG tablet Take 1 tablet (10 mg total) by mouth every 6 (six) hours as needed (Nausea or vomiting). 30 tablet 1  . sodium chloride (MURO 128) 5 % ophthalmic ointment Place 1 application into both eyes 2 (two) times daily.     . traMADol (ULTRAM) 50 MG tablet Take 1 tablet (50 mg total) every 6 (six) hours as needed by mouth. 30 tablet 0   No current facility-administered medications for this encounter.    Blood pressure 127/70, pulse 88, weight 187 lb 12.8 oz (85.2 kg), SpO2 95 %. General: NAD Neck: No JVD, no thyromegaly or thyroid nodule.  Lungs: Clear to auscultation bilaterally with normal respiratory effort. CV: Nondisplaced PMI.  Heart regular S1/S2, no S3/S4, 1/6 SEM RUSB.  No peripheral edema.  No carotid bruit.  Normal pedal pulses.  Abdomen: Soft, nontender, no hepatosplenomegaly, no distention.  Skin: Intact without lesions or rashes.  Neurologic: Alert and oriented x 3.  Psych: Normal affect. Extremities: No clubbing or cyanosis.  HEENT: Normal.   Assessment/Plan: 1. Breast cancer: ER+/PR-/HER2+. She is getting a Herceptin-based regimen. Echo today was reviewed, normal EF and strain pattern. - Repeat echo in 3 months with office visit.  2. Endometrial cancer: s/p hysterectomy and BSO.  3. HTN: BP controlled.   Loralie Champagne 08/27/2017

## 2017-08-27 NOTE — Patient Instructions (Signed)
Follow up and echo in 3 months.  

## 2017-08-27 NOTE — Progress Notes (Signed)
  Echocardiogram 2D Echocardiogram has been performed.  Special Ranes L Androw 08/27/2017, 10:04 AM

## 2017-08-28 ENCOUNTER — Ambulatory Visit
Admission: RE | Admit: 2017-08-28 | Discharge: 2017-08-28 | Disposition: A | Discharge status: Home / Self Care | Payer: 59 | Source: Ambulatory Visit | Attending: Radiation Oncology | Admitting: Radiation Oncology

## 2017-08-28 DIAGNOSIS — C50211 Malignant neoplasm of upper-inner quadrant of right female breast: Secondary | ICD-10-CM | POA: Diagnosis not present

## 2017-08-28 DIAGNOSIS — C541 Malignant neoplasm of endometrium: Secondary | ICD-10-CM | POA: Diagnosis not present

## 2017-08-31 ENCOUNTER — Encounter (HOSPITAL_COMMUNITY): Payer: 59 | Admitting: Cardiology

## 2017-08-31 ENCOUNTER — Telehealth: Payer: Self-pay | Admitting: *Deleted

## 2017-08-31 NOTE — Progress Notes (Signed)
[place]  [phone] [phone]  Clinic Follow up Note   Patient Care Team: [name], MD as PCP - Haskell Riling, MD as Consulting Physician (General Surgery) Truitt Merle, MD as Consulting Physician (Hematology) [name], MD as Attending Physician (Radiation Oncology)   Date of Service:  09/02/2017  CHIEF COMPLAINTS:  Follow up right breast cancer and endometrial cancer     Malignant neoplasm of upper-inner quadrant of right breast in female, estrogen receptor positive ([place])   04/27/2017 Initial Biopsy    Diagnosis Breast, right, needle core biopsy INVASIVE DUCTAL CARCINOMA, GRADE 2 Microscopic Comment The neoplasm has intracellular mucin and signet ring cell features. Immunostains shows these cells are positive for ER,GATA3, ck7 and GCDFP, negative for ck20, cdx2, TTF-1 and pax8, The immunostaining pattern supports the neoplasm is breast primary.      04/27/2017 Receptors her2    Estrogen Receptor: 70%, POSITIVE, MODERATE STAINING INTENSITY Progesterone Receptor: 0%, NEGATIVE Proliferation Marker Ki67: 30% HER2 - **POSITIVE** RATIO OF HER2/CEP17 SIGNALS 2.91 AVERAGE HER2 COPY NUMBER PER CELL 7.28      04/27/2017 Initial Diagnosis    Malignant neoplasm of upper-inner quadrant of right breast in female, estrogen receptor positive ([place])       Mammogram         05/07/2017 Imaging    CT Chest W Contrast 05/07/17 IMPRESSION: Tiny well-defined fatty lesion on the pleura at the right lung base. The thinner slice collimation used for today's chest CT eliminates volume-averaging seen in the lesion on the prior exam and confirms that this is a diffusely fatty nodule. This is a benign finding and likely represents a tiny lipoma. No defect in the hemidiaphragm evident to suggest tiny diaphragmatic hernia. Pulmonary hamartoma a consideration although the lack of soft tissue components makes this less likely.      05/15/2017 Genetic Testing    Patient had genetic testing due to a personal history of breast cancer and uterine cancer as well as a family history of cancer. The Multi-Cancer panel was ordered. The Multi-Cancer Panel offered by [facility] includes sequencing and/or deletion duplication testing of the following 83 genes: ALK, APC, ATM, AXIN2,BAP1,  BARD1, BLM, BMPR1A, BRCA1, BRCA2, BRIP1, CASR, CDC73, CDH1, CDK4, CDKN1B, CDKN1C, CDKN2A (p14ARF), CDKN2A (p16INK4a), CEBPA, CHEK2, CTNNA1, DICER1, DIS3L2, EGFR (c.2369C>T, p.Thr790Met variant only), EPCAM (Deletion/duplication testing only), FH, FLCN, GATA2, GPC3, GREM1 (Promoter region deletion/duplication testing only), HOXB13 (c.251G>A, p.Gly84Glu), HRAS, KIT, MAX, MEN1, MET, MITF (c.952G>A, p.Glu318Lys variant only), MLH1, MSH2, MSH3, MSH6, MUTYH, NBN, NF1, NF2, NTHL1, PALB2, PDGFRA, PHOX2B, PMS2, POLD1, POLE, POT1, PRKAR1A, PTCH1, PTEN, RAD50, RAD51C, RAD51D, RB1, RECQL4, RET, RUNX1, SDHAF2, SDHA (sequence changes only), SDHB, SDHC, SDHD, SMAD4, SMARCA4, SMARCB1, SMARCE1, STK11, SUFU, TERC, TERT, TMEM127, TP53, TSC1, TSC2, VHL, WRN and WT1.   Results: No pathogenic mutations were identified. A VUS in the GATA2 gene c.1348G>A (p.Gly450Arg) was identified.  The date of this test report is 05/25/2017.        05/25/2017 Surgery    RIGHT BREAST LUMPECTOMY WITH RADIOACTIVE SEED AND  RIGHT AXILLARY SENTINEL LYMPH NODE BIOPSY and Pot placement by Dr. Excell Seltzer 05/25/17      05/25/2017 Pathology Results    Diagnosis 05/25/17 1. Breast, lumpectomy, right - INVASIVE DUCTAL CARCINOMA, GRADE II/III, SPANNING 2.2 CM. - DUCTAL CARCINOMA IN SITU, HIGH GRADE. - THE SURGICAL RESECTION MARGINS ARE NEGATIVE FOR CARCINOMA. - SEE ONCOLOGY TABLE BELOW. 2. Breast, excision, right additional superior margin - BENIGN FIBROADIPOSE TISSUE. - BENIGN SKELETAL MUSCLE. -  SEE COMMENT. 3. Breast, excision, right additional lateral margin - BENIGN BREAST PARENCHYMA. - THERE IS NO  EVIDENCE OF MALIGNANCY. - SEE COMMENT. 4. Breast, excision, chest wall margin - BENIGN FIBROADIPOSE TISSUE. - THERE IS NO EVIDENCE OF MALIGNANCY. - SEE COMMENT. 5. Lymph node, sentinel, biopsy, right axillary - THERE IS NO EVIDENCE OF CARCINOMA IN 1 OF 1 LYMPH NODE (0/1). 6. Lymph node, sentinel, biopsy, right axillary - THERE IS NO EVIDENCE OF CARCINOMA IN 1 OF 1 LYMPH NODE (0/1). 7. Lymph node, sentinel, biopsy, right axillary - THERE IS NO EVIDENCE OF CARCINOMA IN 1 OF 1 LYMPH NODE (0/1).      06/26/2017 PET scan    PET  IMPRESSION: 1. Postoperative findings both in the right breast and in the anatomic pelvis, with associated low-grade activity considered to be postoperative in nature. No hypermetabolic adenopathy or hypermetabolic lesions are identified to suggest active metastatic disease/malignancy. 2. Other imaging findings of potential clinical significance: Aortic Atherosclerosis (ICD10-I70.0). Sigmoid colon diverticulosis. Biapical pleuroparenchymal scarring in the lungs. Small amount of free pelvic fluid in the cul-de-sac, likely postoperative.       07/01/2017 -  Chemotherapy    Adjuvant TCH with Onpro every 3 weeks for 6 cycles starting on 07/01/17 followed by Herceptin every 3 weeks for 6 months        Endometrial adenocarcinoma ([place])   05/07/2017 Initial Diagnosis    Endometrial adenocarcinoma ([place])      06/09/2017 Surgery    XI ROBOTIC ASSISTED TOTAL LAPOROSCOPIC HYSTERECTOMY WITH BILATERAL SALPINGO OOPHORECTOMY and SENTINEL NODE BIOPSY by Dr. Denman George 06/09/17      06/09/2017 Pathology Results    Diagnosis 06/09/17 1. Lymph node, sentinel, biopsy, right external iliac - METASTATIC ADENOCARCINOMA IN ONE LYMPH NODE (1/1). 2. Lymph node, sentinel, biopsy, left obturator - ONE BENIGN LYMPH NODE (0/1). 3. Lymph node, sentinel, biopsy, left external iliac - METASTATIC ADENOCARCINOMA IN ONE LYMPH NODE (1/1). 4. Uterus +/- tubes/ovaries,  neoplastic ENDOMETRIUM: - ENDOMETRIAL ADENOCARCINOMA, 3.2 CM. - CARCINOMA INVADES INNER HALF OF MYOMETRIUM. 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 - CERVIX, BILATERAL FALLOPIAN TUBES AND BILATERAL OVARIES FREE OF TUMOR      08/18/2017 -  Radiation Therapy    vaginal brachytherapy per [name] on starting 08/18/17       HISTORY OF PRESENTING ILLNESS (05/06/2017):  Dawn [name] 66 y.o. female is here because of newly diagnosed breast cancer. Screening mammogram detected right breast mass on 04/20/2017. Ultrasound showed 1.5 cm mass in the 1:00 position. The axilla was negative on ultrasound. Biopsy of the right breast was performed on 04/27/2017 revealing invasive ductal carcinoma, Grade 2, ER 70% / PR 0% / HER-2+ / Ki-67 30%.   Of note, she was also recently diagnosed with endometrial cancer. Biopsy of curettage endometrium on 04/29/2017 revealed adenocarcinoma. Immunohistology shows the tumor is positive with estrogen receptor, progesterone receptor with patchy positivity with carcinoembryonic antigen and p16. The tumor is negative with CD10 and vimentin. The morphology and immunophenotype are not definitive as to endocervical or endometrial origin. There are rare fragments with endometrial type stroma, which may indicate an endometrial origin although this is not definitive as to origin.  The patient presents today in our multidisciplinary breast clinic. She is doing well overall. She went to her PCP with abdominal pain and vaginal bleeding which felt like she was having her period all the time. Then she presented to the ER with severe vertigo and heavy vaginal bleeding. She has been having heavy vaginal bleeding  since mid-July. She does not have a history of anemia and associates it with recent bleeding. She has been taking oral iron. She took an aspirin daily but, has not been taking it lately. She had been taking Evista for one year and stopped taking it on her own. She was taking this  medication for her bone density. She takes 2-3 Tylenol daily to manage the abdominal pain / cramping discomfort. She believes this pain is associated with passing clots. She takes a multivitamin daily. The patient is apprehensive about chemotherapy and is unsure if she would like to proceed with chemotherapy if it is recommended. She reports leg cramping. She does not take potassium supplements but, does try to eat bananas. States liver function is typically elevated and has been for about the last 2-3 years.   She has never smoked and doesn't drink alcohol. She did grow up with heavy smoking parents.   Father had colon cancer diagnosed at 52 years-old, and he died of bladder cancer. Paternal grandmother with some type of gynecologic cancer, age of onset in her mid-27s. Patient believes there was more cancer in her family in aunts and uncles however, there are no more living family members to discuss this with.   GYN HISTORY  Menarchal: 62 LMP: age of 76 Contraceptive: none  HRT: none  G2P2: She was 89 when she gave first live births.   CURRENT THERAPY:   -Adjuvant TCH with Onpro every 3 weeks for 6 cycles starting 07/01/17. Changed Taxol to abraxane with cycle 4 due to drug rash reaction. Followed by Herceptin every 3 weeks for total of 12 months.  -vaginal brachytherapy with [name] starting 08/18/17    INTERVAL HISTORY:  Dawn [name] is here for a follow up and fourth cycle TCH. She presents to the clinic today accompanied by her husband.  She wanted to know if she would change her taxol to abraxane due to rash reaction. She is also about to run out of her dexa. She will call to refill it. She notes her brachytherapy makes her fatigue. Overall she is doing well.       MEDICAL HISTORY:  Past Medical History:  Diagnosis Date  . Anemia    as a child.  . Arthritis   . Bilateral cataracts   . Bilateral leg cramps   . Cancer ([place])    skin  . Dyspnea   . Family history of  bladder cancer   . Family history of colon cancer in father   . Fuchs' corneal dystrophy   . GERD (gastroesophageal reflux disease)   . Hearing loss    Right side 30%  . History of hiatal hernia    small size  . Hyperlipidemia   . Hypertension   . IBS (irritable bowel syndrome)    hx of  . Malignant neoplasm of upper-inner quadrant of right female breast ([place])   . NAFL (nonalcoholic fatty liver)   . Obesity   . PONV (postoperative nausea and vomiting)   . 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 Uterine cancer ([place])    endometrial cancer  . Varices, gastric   . Vertigo     SURGICAL HISTORY: Past Surgical History:  Procedure Laterality Date  . BREAST BIOPSY Right 04/27/2017  . BREAST LUMPECTOMY WITH RADIOACTIVE SEED AND SENTINEL LYMPH NODE BIOPSY Right 05/25/2017   Procedure: RIGHT BREAST LUMPECTOMY WITH RADIOACTIVE SEED AND  RIGHT AXILLARY SENTINEL LYMPH NODE BIOPSY;  Surgeon:  Excell Seltzer, MD;  Location: [facility];  Service: General;  Laterality: Right;  . COLONOSCOPY    . HYSTEROSCOPY W/D&C N/A 04/29/2017   Procedure: DILATATION AND CURETTAGE /HYSTEROSCOPY;  Surgeon: Linda Hedges, DO;  Location: [facility];  Service: Gynecology;  Laterality: N/A;  . PORTACATH PLACEMENT Left 05/25/2017   Procedure: INSERTION PORT-A-CATH;  Surgeon: Excell Seltzer, MD;  Location: [facility];  Service: General;  Laterality: Left;  . ROBOTIC ASSISTED TOTAL HYSTERECTOMY WITH BILATERAL SALPINGO OOPHERECTOMY N/A 06/09/2017   Procedure: XI ROBOTIC ASSISTED TOTAL LAPOROSCOPIC HYSTERECTOMY WITH BILATERAL SALPINGO OOPHORECTOMY;  Surgeon: Everitt Amber, MD;  Location: [facility];  Service: Gynecology;  Laterality: N/A;  . SENTINEL NODE BIOPSY N/A 06/09/2017   Procedure: SENTINEL NODE BIOPSY;  Surgeon: Everitt Amber, MD;  Location: [facility];  Service: Gynecology;  Laterality: N/A;    SOCIAL HISTORY: Social History   Socioeconomic History  . Marital  status: Married    Spouse name: Not on file  . Number of children: Not on file  . Years of education: Not on file  . Highest education level: Not on file  Social Needs  . Financial resource strain: Not on file  . Food insecurity - worry: Not on file  . Food insecurity - inability: Not on file  . Transportation needs - medical: Not on file  . Transportation needs - non-medical: Not on file  Occupational History    Employer: [employer]    Comment: [employer]   Tobacco Use  . Smoking status: Never Smoker  . Smokeless tobacco: Never Used  Substance and Sexual Activity  . Alcohol use: Yes    Alcohol/week: 0.0 oz    Comment: <1/week wine or beer occasional  . Drug use: No  . Sexual activity: Yes    Birth control/protection: Post-menopausal  Other Topics Concern  . Not on file  Social History Narrative   2-3 caffeine drinks per day. Regular exercise.  Walking 3 miles a day.     FAMILY HISTORY: Family History  Problem Relation Age of Onset  . Colon cancer Father 35  . Heart attack Father 33  . Prostate cancer Father        dx 68's  . Bladder Cancer Father 56  . Stroke Mother 14  . Diabetes Maternal Grandfather   . Kidney disease Maternal Grandfather   . Asthma Brother   . Cancer Paternal Grandmother 65       GYN cancer ( thinks ovarian, maybe uterine)  . Heart attack Maternal Grandmother 70  . Breast cancer Other 18  . Colon cancer Other 19  . Cancer Other        type unk, age dx unk    ALLERGIES:  is allergic to ciprofloxacin; crestor [rosuvastatin calcium]; and penicillins.  MEDICATIONS:  Current Outpatient Medications  Medication Sig Dispense Refill  . acetaminophen (TYLENOL) 325 MG tablet Take 650 mg by mouth every 6 (six) hours as needed for mild pain or moderate pain.    . Ascorbic Acid (VITAMIN C) 1000 MG tablet Take 1,000 mg by mouth daily.    . calcium carbonate (TUMS - DOSED IN MG ELEMENTAL CALCIUM) 500 MG chewable tablet Chew 2  tablets by mouth 2 (two) times daily as needed for indigestion or heartburn.    . dexamethasone (DECADRON) 4 MG tablet Take 1 tablet (4 mg total) by mouth daily. Take BID X 2 days, then daily until completed. 20 tablet 0  . fluconazole (DIFLUCAN) 100  MG tablet Take 2 tablets today, then 1 tablet daily x 6 more days. Hold Pitavastatin while on this. 8 tablet 0  . lidocaine-prilocaine (EMLA) cream Apply to affected area once 30 g 3  . meclizine (ANTIVERT) 25 MG tablet Take 1-2 tablets (25-50 mg total) by mouth 3 (three) times daily as needed for dizziness. (Patient taking differently: Take 25-50 mg by mouth 3 (three) times daily as needed for dizziness (depends on dizziness if takes 1-2 tablets). ) 30 tablet 0  . Multiple Vitamin (MULTIVITAMIN) capsule Take 1 capsule by mouth daily.      Marland Kitchen nystatin (MYCOSTATIN/NYSTOP) powder Apply topically 4 (four) times daily. 15 g 0  . olmesartan-hydrochlorothiazide (BENICAR HCT) 20-12.5 MG tablet Take 1 tablet by mouth daily. 90 tablet 2  . ondansetron (ZOFRAN) 8 MG tablet Take 1 tablet (8 mg total) by mouth 2 (two) times daily as needed for refractory nausea / vomiting. Start on day 3 after chemo. 30 tablet 1  . oxyCODONE-acetaminophen (PERCOCET/ROXICET) 5-325 MG tablet Take 1-2 tablets by mouth every 4 (four) hours as needed (moderate to severe pain). 30 tablet 0  . Pitavastatin Calcium (LIVALO) 2 MG TABS Take 3 mg by mouth every evening. Takes 1.5 tablet    . prochlorperazine (COMPAZINE) 10 MG tablet Take 1 tablet (10 mg total) by mouth every 6 (six) hours as needed (Nausea or vomiting). 30 tablet 1  . sodium chloride (MURO 128) 5 % ophthalmic ointment Place 1 application into both eyes 2 (two) times daily.     . traMADol (ULTRAM) 50 MG tablet Take 1 tablet (50 mg total) every 6 (six) hours as needed by mouth. 30 tablet 0   No current facility-administered medications for this visit.     REVIEW OF SYSTEMS:   Constitutional: Denies fevers, chills or abnormal  night sweats  (+) complete hair loss (+) fatigue  Eyes: Denies blurriness of vision, double vision or watery eyes Ears, nose, mouth, throat, and face: Denies mucositis or sore throat Respiratory: Denies cough, dyspnea or wheezes Cardiovascular: Denies palpitation, chest discomfort or lower extremity swelling Gastrointestinal:  Denies nausea, vomiting, constipation, diarrhea, heartburn or change in bowel habits Skin: Denies abnormal skin rashes.  Lymphatics: Denies new lymphadenopathy or easy bruising Musculoskeletal: Negative Neurological:Denies numbness, tingling or new weaknesses (+) vertigo Behavioral/Psych: Mood is stable, no new changes  All other systems were reviewed with the patient and are negative.  PHYSICAL EXAMINATION: ECOG PERFORMANCE STATUS: 1 - Symptomatic but completely ambulatory  Vitals:   09/02/17 1100  BP: 135/83  Pulse: 80  Resp: 20  Temp: 98.5 F (36.9 C)  SpO2: 98%   Filed Weights   09/02/17 1100  Weight: 185 lb 12.8 oz (84.3 kg)     GENERAL:alert, no distress and comfortable SKIN: skin color, texture, turgor are normal, no rashes or significant lesions EYES: normal, conjunctiva are pink and non-injected, sclera clear OROPHARYNX:no exudate, no erythema and lips, buccal mucosa, and tongue normal  NECK: supple, thyroid normal size, non-tender, without nodularity LYMPH:  no palpable lymphadenopathy in the cervical, axillary or inguinal LUNGS: clear to auscultation with normal breathing effort HEART: regular rate & rhythm and no lower extremity edema (+) heart murmur in the aortic valve ABDOMEN:abdomen soft, non-tender and normal bowel sounds (+) s/p total hysterectomy and salpingo-oophorectomy, 4 horizontal laparoscopic surgical incisions at the level of the umbilicus, healing well  Musculoskeletal:no cyanosis of digits and no clubbing  PSYCH: alert & oriented x 3 with fluent speech NEURO: no focal motor/sensory deficits BREAST:  Left breast shows no  palpable mass or adenopathy. Right breast shows no palpable mass or adenopathy.  (+) S/p right lumpectomy, surgical incision in right axilla and around the areola healed well without skin erythema or discharge  LABORATORY DATA:  I have reviewed the data as listed CBC Latest Ref Rng & Units 09/02/2017 08/12/2017 07/22/2017  WBC 3.9 - 10.3 10e3/uL 5.2 4.2 4.4  Hemoglobin 11.6 - 15.9 g/dL 11.5(L) 11.8 12.5  Hematocrit 34.8 - 46.6 % 33.5(L) 34.0(L) 36.4  Platelets 145 - 400 10e3/uL 202 166 256    CMP Latest Ref Rng & Units 09/02/2017 08/12/2017 07/22/2017  Glucose 70 - 140 mg/dl 96 116 165(H)  BUN 7.0 - 26.0 mg/dL 9.0 9.7 7.8  Creatinine 0.6 - 1.1 mg/dL 0.8 0.8 0.8  Sodium 136 - 145 mEq/L 136 139 137  Potassium 3.5 - 5.1 mEq/L 3.3(L) 3.4(L) 3.5  Chloride 101 - 111 mmol/L - - -  CO2 22 - 29 mEq/L _0 Calcium 8.4 - 10.4 mg/dL 9.4 9.3 9.1  Total Protein 6.4 - 8.3 g/dL 7.2 7.0 7.0  Total Bilirubin 0.20 - 1.20 mg/dL 0.57 0.53 0.51  Alkaline Phos 40 - 150 U/L 57 51 58  AST 5 - 34 U/L 96(H) 59(H) 56(H)  ALT 0 - 55 U/L 144(H) 97(H) 137(H)   PATHOLOGY FINDINGS:   Diagnosis 06/09/17 1. Lymph node, sentinel, biopsy, right external iliac - METASTATIC ADENOCARCINOMA IN ONE LYMPH NODE (1/1). 2. Lymph node, sentinel, biopsy, left obturator - ONE BENIGN LYMPH NODE (0/1). 3. Lymph node, sentinel, biopsy, left external iliac - METASTATIC ADENOCARCINOMA IN ONE LYMPH NODE (1/1). 4. Uterus +/- tubes/ovaries, neoplastic ENDOMETRIUM: - ENDOMETRIAL ADENOCARCINOMA, 3.2 CM. - CARCINOMA INVADES INNER HALF OF MYOMETRIUM. 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 - CERVIX, BILATERAL FALLOPIAN TUBES AND BILATERAL OVARIES FREE OF TUMOR. Microscopic Comment 4. ONCOLOGY TABLE-UTERUS, CARCINOMA OR CARCINOSARCOMA Specimen: Uterus with bilateral fallopian tubes and ovaries, right and left external iliac nodes and left obturator lymph node. Procedure: Hysterectomy with bilateral salpingo-oophorectomy and  lymph node biopsies. Lymph node sampling performed: Yes. Specimen integrity: Intact. Maximum tumor size: 3.2 cm. Histologic type: Mixed, endometrioid, clear cell and serous. Grade: III. Myometrial invasion: 0.8 cm where myometrium is 2.2 cm in thickness. Cervical stromal involvement: No. Extent of involvement of other organs: N/A. Lymph - vascular invasion: Present. Peritoneal washings: N/A. Lymph nodes: Examined: 3 Sentinel 1 of 3 FINAL for Dawn [name], Dawn [name] ([id number]) Microscopic Comment(continued) 0 Non-sentinel 3 Total Lymph nodes with metastasis: 2. Isolated tumor cells (< 0.2 mm): 0. Micrometastasis: (> 0.2 mm and < 2.0 mm): 0. Macrometastasis: (> 2.0 mm): 2. Extracapsular extension: No. TNM code: pT1a, pN1. FIGO Stage (based on pathologic findings, needs clinical correlation): IIIC1. Comments: The tumor is 3.2 cm in greatest dimension and invades the myometrium to a depth of 0.8 cm where the myometrium is 2.2 cm in thickness and there is lymphatic vascular involvement by tumor. 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 (JDP:ah 06/10/17)   Diagnosis 05/25/17 1. Breast, lumpectomy, right - INVASIVE DUCTAL CARCINOMA, GRADE II/III, SPANNING 2.2 CM. - DUCTAL CARCINOMA IN SITU, HIGH GRADE. - THE SURGICAL RESECTION MARGINS ARE NEGATIVE FOR CARCINOMA. - SEE ONCOLOGY TABLE BELOW. 2. Breast, excision, right additional superior margin - BENIGN FIBROADIPOSE TISSUE. - BENIGN SKELETAL MUSCLE. - SEE COMMENT. 3. Breast, excision, right additional lateral margin - BENIGN BREAST PARENCHYMA. - THERE IS NO EVIDENCE OF MALIGNANCY. - SEE COMMENT. 4. Breast, excision, chest wall margin - BENIGN  FIBROADIPOSE TISSUE. - THERE IS NO EVIDENCE OF MALIGNANCY. - SEE COMMENT. 5. Lymph node, sentinel, biopsy, right axillary - THERE IS NO EVIDENCE OF CARCINOMA IN 1 OF 1 LYMPH NODE (0/1). 6. Lymph node, sentinel, biopsy, right axillary - THERE IS NO EVIDENCE OF  CARCINOMA IN 1 OF 1 LYMPH NODE (0/1). 7. Lymph node, sentinel, biopsy, right axillary - THERE IS NO EVIDENCE OF CARCINOMA IN 1 OF 1 LYMPH NODE (0/1). Microscopic Comment 1. BREAST, INVASIVE TUMOR Procedure: Seed localized lumpectomy, additional superior, lateral, and chest wall margin resections and axillary lymph node resections. Laterality: Right. Tumor Size: 2.2 cm (gross measurement). Histologic Type: Ductal. Microscopic Comment(continued) Grade: II. Tubular Differentiation: 2. Nuclear Pleomorphism: 2. Mitotic Count: 2. Ductal Carcinoma in Situ (DCIS): Present, high grade. Extent of Tumor: Confined to breast parenchyma. Margins: Greater than or equal to 0.2 cm to all margins. Regional Lymph Nodes: Number of Lymph Nodes Examined: 3. Number of Sentinel Lymph Nodes Examined: 3. Lymph Nodes with Macrometastases: 0. Lymph Nodes with Micrometastases: 0. Lymph Nodes with Isolated Tumor Cells: 0. Breast Prognostic Profile: Case [id number]. Estrogen Receptor: 70%, moderate. Progesterone Receptor: 0%. Her2: Amplification was detected. The ratio was 2.91. Ki-67: 30%. Best tumor block for sendout testing: 1A-1C. Pathologic Stage Classification (pTNM, AJCC 8th Edition): Primary Tumor (pT): pT2. Regional Lymph Nodes (pN): pN0. Distant Metastases (pM): pMX. 2. - 4. The surgical resection margin(s) of the specimen were inked and microscopically evaluated.    Diagnosis 04/29/2017 Endometrium, curettage - ADENOCARCINOMA. - SEE MICROSCOPIC DESCRIPTION. Microscopic Comment There are fragments of adenocarcinoma, some of which have cribriform architecture, and there is focal extracellular mucin. Immunohistochemistry shows the tumor is positive with estrogen receptor, progesterone receptor with patchy positivity with carcinoembryonic antigen and p16. The tumor is negative with CD10 and vimentin. The morphology and immunophenotype are not definitive as to endocervical or endometrial  origin. There are rare fragments with endometrial type stroma, which may indicate an endometrial origin although this is not definitive as to origin.  Diagnosis 04/27/2017 Breast, right, needle core biopsy INVASIVE DUCTAL CARCINOMA, GRADE 2 Microscopic Comment The neoplasm has intracellular mucin and signet ring cell features. Immunostains shows these cells are positive for ER, GATA3, ck7 and GCDFP, negative for ck20, cdx2, TTF-1 and pax8, The immunostaining pattern supports the neoplasm is breast primary. The Breast prognostic profile has been ordered. Results: IMMUNOHISTOCHEMICAL AND MORPHOMETRIC ANALYSIS PERFORMED MANUALLY Estrogen Receptor: 70%, POSITIVE, MODERATE STAINING INTENSITY Progesterone Receptor: 0%, NEGATIVE Proliferation Marker Ki67: 30% Results: HER2 - **POSITIVE** RATIO OF HER2/CEP17 SIGNALS 2.91 AVERAGE HER2 COPY NUMBER PER CELL 7.28  GENETICS 05/07/17   PROCEDURES   ECHO 08/27/17  Study Conclusions - Left ventricle: The cavity size was normal. Wall thickness was   normal. Systolic function was normal. The estimated ejection   fraction was in the range of 60% to 65%. Wall motion was normal;   there were no regional wall motion abnormalities. Doppler   parameters are consistent with abnormal left ventricular   relaxation (grade 1 diastolic dysfunction). GLS -18.6%. - Aortic valve: There was no stenosis. - Mitral valve: There was no regurgitation. - Right ventricle: The cavity size was mildly dilated. Systolic   function was normal. - Right atrium: The atrium was mildly dilated. - Tricuspid valve: Peak RV-RA gradient (S): 21 mm Hg. - Pulmonary arteries: PA peak pressure: 24 mm Hg (S). - Inferior vena cava: The vessel was normal in size. The   respirophasic diameter changes were in the normal range (>= 50%),   consistent with  normal central venous pressure. Impressions: - Normal LV size and systolic function, EF 57-32%. Strain as above.   Mildly dilated  RV with normal systolic function.   ECHO 05/07/17 Impressions: - Normal LV size with mild LV hypertrophy. EF 65-70%, vigorous   systolic function. Strain as above. Normal RV size and systolic   function. No significant valvular abnormalities.    RADIOGRAPHIC STUDIES: I have personally reviewed the radiological images as listed and agreed with the findings in the report. No results found.  ASSESSMENT & PLAN:  66 y.o. 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 Malignant neoplasm of upper inner quadrant of right breast , Invasive ductal carcinoma, pT2N0M0, G2,  ER+/PR-/HER2+ -She underwent right breast lumpectomy with sentinel lymph node biopsy with Dr. Excell Seltzer on 05/25/2017 with port placement -Pathology confirms invasive ductal carcinoma, grade 2, spanning 2.2 cm, margins were clear, no positive lymph nodes, ER positive, PR negative, HER-2 positive. I reviewed this with the patient -We discussed the risk of cancer recurrence after completed surgical resection.  Due to the HER-2 positive disease, she is at moderate to high risk.  I recommend adjuvant chemotherapy.  The regiment was selected to cover for endometrial cancer also. -She has started adjuvant TCH every 3 weeks with Onpro on 07/01/17. Experienced rash and significant  bone pain. I discussed if she continue to have reaction we can switch her Taxol to abraxane. I called in Tramadol for bone pain. Bone pain now controlled.   -Starting with cycle 4 will change her taxol to abraxane given her previously drug rash reaction  -Labs reviewed, mild anemia, her CMP is still pending for today.  -She will watch for rash and will only use dexa is she develops skin rash again.  -F/u on 1/9   2. Endometrial adenocarcinoma with lymph node metastasis, pT1ApN1M0, FIGO stage IIIc -She underwent a total hysterectomy with salpingo-oophorectomy  by Dr. Denman George on  06/09/17 -We discussed her surgical pathology. she has high risk features including lymphovascular involvement, nodal positive disease, and mixed histology with endometrioid, clear cell, and serous carcinoma  -We discussed the high risk of cancer recurrence after surgery extensively, and I strongly encouraged her to consider adjuvant chemotherapy to reduce her risk of recurrence. -she has started adjuvant Taxol plus carboplatin every 3 weeks 6 cycles, which is a standard adjuvant chemotherapy regimen -I sent a message to rad/onc Drs. Isidore Moos and Kinard to coordinate her adjuvant brachytherapy, we will likely start soon, with concurrent chemotherapy -She started vaginal brachytherapy by [name] on 12/4-1/3.    3. Genetics -Given her personal history of breast and endometrial cancer, and a family history of malignancy, she will be referred to see a genetic counselor for genetic testing to ruled out cancer syndromes. -Genetics results were negative   4. HTN -She is on HCTZ and I explained that chemo can effect her BP so if her BP drops we will hold her HCTZ.  -Pt holds her HCTZ before treatment as this can effect her chemo.    5. Vertigo  -Symptoms have returned after surgery  -She will restart her Meclizine.    6. Insurance and Emotional support  -referred to our financial support with Annam[facility] Rummage  -She feels saddened by her diagnosis. She declined chaplin and therapy services at this time.  -she has FMLA through November for surgery, she will provide paperwork to extend [facility] through chemotherapy if necessary   7.  Elevated LFTs - lFTs are chronically elevated due to fatty liver disease -Will need to monitor her liver functions closely while on chemo.  -Her ALT and AST are slightly worse today, repeat is normal, still adequate for treatment, will continue monitoring  8. Drug Rash -secondary to Taxol, started one week after first cycle chemo  -treated with Benadryl, Pepcid and  Decadron, resolved  -I called in dexa, she will take 49m daily for 5 days after chemo  -no drug rash occurred with her dexa after cycle 2. She will shorten steroid course to 1 tablet daily times 3 days after chemo, if she remains free of skin rash will consider discontinuing steroids with cycle 4.  -Rash has resolved, She will use dexa only if rash returns.  -Changed taxol to abraxane with cycle 4.    9. Bone pain from Onpro -I prescribed tramadol today, she will use as needed for moderate-to-severe pain -She will continue Claritin daily for 5 days after the injection  -mild bone pain controlled with Claritin, Tylenol, and Advil as needed. She has not needed her tramadol.    Follow-up: -Refill dexamethasone today, will only take if develops skin rash again after chemo.  -Labs reviewed and adequate to proceed with Abraxane, Carbo and Herceptin and Perjeta today, no dose adjustment needed  -Lab, flush, f/u and chemo on 1/9    No orders of the defined types were placed in this encounter.   All questions were answered. The patient knows to call the clinic with any problems, questions or concerns. I spent 25 minutes counseling the patient face to face. The total time spent in the appointment was 30 minutes and more than 50% was on counseling.    YTruitt Merle 09/02/2017    This document serves as a record of services personally performed by YTruitt Merle MD. It was created on her behalf by AJoslyn Devon a trained medical scribe. The creation of this record is based on the scribe's personal observations and the provider's statements to them.    I have reviewed the above documentation for accuracy and completeness, and I agree with the above.

## 2017-08-31 NOTE — Telephone Encounter (Signed)
CALLED PATIENT TO REMIND OF HDR TX. FOR 09-01-17 @ 9 AM, SPOKE WITH PATIENT AND SHE IS AWARE OF THIS [place].

## 2017-09-01 ENCOUNTER — Ambulatory Visit
Admission: RE | Admit: 2017-09-01 | Discharge: 2017-09-01 | Disposition: A | Discharge status: Home / Self Care | Payer: 59 | Source: Ambulatory Visit | Attending: Radiation Oncology | Admitting: Radiation Oncology

## 2017-09-01 ENCOUNTER — Other Ambulatory Visit: Payer: Self-pay | Admitting: Hematology

## 2017-09-01 ENCOUNTER — Telehealth: Payer: Self-pay | Admitting: Hematology

## 2017-09-01 DIAGNOSIS — C50211 Malignant neoplasm of upper-inner quadrant of right female breast: Secondary | ICD-10-CM | POA: Diagnosis not present

## 2017-09-01 DIAGNOSIS — C541 Malignant neoplasm of endometrium: Secondary | ICD-10-CM | POA: Diagnosis not present

## 2017-09-01 NOTE — Telephone Encounter (Signed)
Thu,  I have already changed her taxol to Abraxane, due to her allergy reactions.   Truitt Merle MD

## 2017-09-01 NOTE — Telephone Encounter (Signed)
Patient called in. Asking if her chemo can be changed because it is making her sick/ allergic reaction. She would like for a nurse to call her back. She had questions about authorization, but no authorization is required for her chemo. She has [employer].

## 2017-09-02 ENCOUNTER — Ambulatory Visit (HOSPITAL_BASED_OUTPATIENT_CLINIC_OR_DEPARTMENT_OTHER): Payer: 59 | Admitting: Hematology

## 2017-09-02 ENCOUNTER — Ambulatory Visit: Payer: 59

## 2017-09-02 ENCOUNTER — Other Ambulatory Visit (HOSPITAL_BASED_OUTPATIENT_CLINIC_OR_DEPARTMENT_OTHER): Payer: 59

## 2017-09-02 ENCOUNTER — Ambulatory Visit (HOSPITAL_BASED_OUTPATIENT_CLINIC_OR_DEPARTMENT_OTHER): Payer: 59

## 2017-09-02 VITALS — BP 135/83 | HR 80 | Temp 98.5°F | Resp 20 | Ht 63.0 in | Wt 185.8 lb

## 2017-09-02 DIAGNOSIS — C541 Malignant neoplasm of endometrium: Secondary | ICD-10-CM

## 2017-09-02 DIAGNOSIS — C773 Secondary and unspecified malignant neoplasm of axilla and upper limb lymph nodes: Secondary | ICD-10-CM | POA: Diagnosis not present

## 2017-09-02 DIAGNOSIS — Z5112 Encounter for antineoplastic immunotherapy: Secondary | ICD-10-CM

## 2017-09-02 DIAGNOSIS — R7401 Elevation of levels of liver transaminase levels: Secondary | ICD-10-CM

## 2017-09-02 DIAGNOSIS — Z5189 Encounter for other specified aftercare: Secondary | ICD-10-CM

## 2017-09-02 DIAGNOSIS — Z95828 Presence of other vascular implants and grafts: Secondary | ICD-10-CM

## 2017-09-02 DIAGNOSIS — Z170 Estrogen receptor positive status [ER+]: Secondary | ICD-10-CM

## 2017-09-02 DIAGNOSIS — Z5111 Encounter for antineoplastic chemotherapy: Secondary | ICD-10-CM | POA: Diagnosis not present

## 2017-09-02 DIAGNOSIS — R21 Rash and other nonspecific skin eruption: Secondary | ICD-10-CM

## 2017-09-02 DIAGNOSIS — C50211 Malignant neoplasm of upper-inner quadrant of right female breast: Secondary | ICD-10-CM | POA: Diagnosis not present

## 2017-09-02 DIAGNOSIS — I10 Essential (primary) hypertension: Secondary | ICD-10-CM

## 2017-09-02 DIAGNOSIS — R740 Nonspecific elevation of levels of transaminase and lactic acid dehydrogenase [LDH]: Secondary | ICD-10-CM

## 2017-09-02 LAB — CBC WITH DIFFERENTIAL/PLATELET
BASO%: 0.5 % (ref 0.0–2.0)
BASOS ABS: 0 10*3/uL (ref 0.0–0.1)
EOS ABS: 0 10*3/uL (ref 0.0–0.5)
EOS%: 0.7 % (ref 0.0–7.0)
HCT: 33.5 % — ABNORMAL LOW (ref 34.8–46.6)
HEMOGLOBIN: 11.5 g/dL — AB (ref 11.6–15.9)
LYMPH%: 32.3 % (ref 14.0–49.7)
MCH: 34.7 pg — AB (ref 25.1–34.0)
MCHC: 34.4 g/dL (ref 31.5–36.0)
MCV: 101 fL (ref 79.5–101.0)
MONO#: 0.6 10*3/uL (ref 0.1–0.9)
MONO%: 11.8 % (ref 0.0–14.0)
NEUT#: 2.8 10*3/uL (ref 1.5–6.5)
NEUT%: 54.7 % (ref 38.4–76.8)
Platelets: 202 10*3/uL (ref 145–400)
RBC: 3.32 10*6/uL — AB (ref 3.70–5.45)
RDW: 16.6 % — AB (ref 11.2–14.5)
WBC: 5.2 10*3/uL (ref 3.9–10.3)
lymph#: 1.7 10*3/uL (ref 0.9–3.3)

## 2017-09-02 LAB — COMPREHENSIVE METABOLIC PANEL
ALBUMIN: 3.8 g/dL (ref 3.5–5.0)
ALK PHOS: 57 U/L (ref 40–150)
ALT: 144 U/L — ABNORMAL HIGH (ref 0–55)
AST: 96 U/L — AB (ref 5–34)
Anion Gap: 8 mEq/L (ref 3–11)
BUN: 9 mg/dL (ref 7.0–26.0)
CHLORIDE: 101 meq/L (ref 98–109)
CO2: 27 meq/L (ref 22–29)
Calcium: 9.4 mg/dL (ref 8.4–10.4)
Creatinine: 0.8 mg/dL (ref 0.6–1.1)
GLUCOSE: 96 mg/dL (ref 70–140)
POTASSIUM: 3.3 meq/L — AB (ref 3.5–5.1)
SODIUM: 136 meq/L (ref 136–145)
Total Bilirubin: 0.57 mg/dL (ref 0.20–1.20)
Total Protein: 7.2 g/dL (ref 6.4–8.3)

## 2017-09-02 MED ORDER — PEGFILGRASTIM 6 MG/0.6ML ~~LOC~~ PSKT
PREFILLED_SYRINGE | SUBCUTANEOUS | Status: AC
  Filled 2017-09-02: qty 0.6

## 2017-09-02 MED ORDER — SODIUM CHLORIDE 0.9% FLUSH
10.0000 mL | INTRAVENOUS | Status: DC | PRN
  Administered 2017-09-02: 10 mL via INTRAVENOUS
  Filled 2017-09-02: qty 10

## 2017-09-02 MED ORDER — SODIUM CHLORIDE 0.9 % IV SOLN
Freq: Once | INTRAVENOUS | Status: AC
  Administered 2017-09-02: 13:00:00 via INTRAVENOUS
  Filled 2017-09-02: qty 5

## 2017-09-02 MED ORDER — ACETAMINOPHEN 325 MG PO TABS
650.0000 mg | ORAL_TABLET | Freq: Once | ORAL | Status: AC
  Administered 2017-09-02: 650 mg via ORAL

## 2017-09-02 MED ORDER — DIPHENHYDRAMINE HCL 50 MG/ML IJ SOLN
INTRAMUSCULAR | Status: AC
  Filled 2017-09-02: qty 1

## 2017-09-02 MED ORDER — DIPHENHYDRAMINE HCL 25 MG PO CAPS
50.0000 mg | ORAL_CAPSULE | Freq: Once | ORAL | Status: AC
  Administered 2017-09-02: 50 mg via ORAL

## 2017-09-02 MED ORDER — SODIUM CHLORIDE 0.9 % IV SOLN
Freq: Once | INTRAVENOUS | Status: AC
  Administered 2017-09-02: 12:00:00 via INTRAVENOUS

## 2017-09-02 MED ORDER — SODIUM CHLORIDE 0.9% FLUSH
10.0000 mL | INTRAVENOUS | Status: DC | PRN
  Administered 2017-09-02: 10 mL
  Filled 2017-09-02: qty 10

## 2017-09-02 MED ORDER — PALONOSETRON HCL INJECTION 0.25 MG/5ML
0.2500 mg | Freq: Once | INTRAVENOUS | Status: AC
  Administered 2017-09-02: 0.25 mg via INTRAVENOUS

## 2017-09-02 MED ORDER — ACETAMINOPHEN 325 MG PO TABS
ORAL_TABLET | ORAL | Status: AC
  Filled 2017-09-02: qty 2

## 2017-09-02 MED ORDER — PEGFILGRASTIM 6 MG/0.6ML ~~LOC~~ PSKT
6.0000 mg | PREFILLED_SYRINGE | Freq: Once | SUBCUTANEOUS | Status: AC
  Administered 2017-09-02: 6 mg via SUBCUTANEOUS

## 2017-09-02 MED ORDER — PACLITAXEL PROTEIN-BOUND CHEMO INJECTION 100 MG
175.0000 mg/m2 | Freq: Once | INTRAVENOUS | Status: AC
Start: 2017-09-02 — End: 2017-09-02
  Administered 2017-09-02: 350 mg via INTRAVENOUS
  Filled 2017-09-02: qty 70

## 2017-09-02 MED ORDER — DEXAMETHASONE 4 MG PO TABS: 4.0000 mg | ORAL_TABLET | Freq: Every day | ORAL | 0 refills | Status: DC

## 2017-09-02 MED ORDER — SODIUM CHLORIDE 0.9 % IV SOLN
6.0000 mg/kg | Freq: Once | INTRAVENOUS | Status: AC
  Administered 2017-09-02: 525 mg via INTRAVENOUS
  Filled 2017-09-02: qty 25

## 2017-09-02 MED ORDER — DIPHENHYDRAMINE HCL 25 MG PO CAPS
ORAL_CAPSULE | ORAL | Status: AC
  Filled 2017-09-02: qty 2

## 2017-09-02 MED ORDER — FAMOTIDINE IN NACL 20-0.9 MG/50ML-% IV SOLN
INTRAVENOUS | Status: AC
  Filled 2017-09-02: qty 50

## 2017-09-02 MED ORDER — HEPARIN SOD (PORK) LOCK FLUSH 100 UNIT/ML IV SOLN
500.0000 [IU] | Freq: Once | INTRAVENOUS | Status: AC | PRN
  Administered 2017-09-02: 500 [IU]
  Filled 2017-09-02: qty 5

## 2017-09-02 MED ORDER — CARBOPLATIN CHEMO INJECTION 600 MG/60ML
607.2000 mg | Freq: Once | INTRAVENOUS | Status: AC
  Administered 2017-09-02: 610 mg via INTRAVENOUS
  Filled 2017-09-02: qty 61

## 2017-09-02 MED ORDER — PALONOSETRON HCL INJECTION 0.25 MG/5ML
INTRAVENOUS | Status: AC
  Filled 2017-09-02: qty 5

## 2017-09-02 NOTE — Progress Notes (Signed)
[name] reviewed CMET results today.  OK to treat as per [name].

## 2017-09-02 NOTE — Patient Instructions (Signed)
[facility] Discharge Instructions for Patients Receiving Chemotherapy  Today you received the following chemotherapy agents: Herceptin, Taxol and Abraxane  To help prevent nausea and vomiting after your treatment, we encourage you to take your nausea medication as directed.   If you develop nausea and vomiting that is not controlled by your nausea medication, call the clinic.   BELOW ARE SYMPTOMS THAT SHOULD BE REPORTED IMMEDIATELY:  *FEVER GREATER THAN 100.5 F  *CHILLS WITH OR WITHOUT FEVER  NAUSEA AND VOMITING THAT IS NOT CONTROLLED WITH YOUR NAUSEA MEDICATION  *UNUSUAL SHORTNESS OF BREATH  *UNUSUAL BRUISING OR BLEEDING  TENDERNESS IN MOUTH AND THROAT WITH OR WITHOUT PRESENCE OF ULCERS  *URINARY PROBLEMS  *BOWEL PROBLEMS  UNUSUAL RASH Items with * indicate a potential emergency and should be followed up as soon as possible.  Feel free to call the clinic should you have any questions or concerns. The clinic phone number is [phone].  Please show the [place] at check-in to the Emergency Department and triage nurse.    Nanoparticle Albumin-Bound Paclitaxel injection What is this medicine? NANOPARTICLE ALBUMIN-BOUND PACLITAXEL (Na no PAHR ti kuhl al BYOO muhn-bound PAK li TAX el) is a chemotherapy drug. It targets fast dividing cells, like cancer cells, and causes these cells to die. This medicine is used to treat advanced breast cancer and advanced lung cancer. This medicine may be used for other purposes; ask your health care provider or pharmacist if you have questions. COMMON BRAND NAME(S): Abraxane What should I tell my health care provider before I take this medicine? They need to know if you have any of these conditions: -kidney disease -liver disease -low blood counts, like low platelets, red blood cells, or white blood cells -recent or ongoing radiation therapy -an unusual or allergic reaction to paclitaxel, albumin, other  chemotherapy, other medicines, foods, dyes, or preservatives -pregnant or trying to get pregnant -breast-feeding How should I use this medicine? This drug is given as an infusion into a vein. It is administered in a hospital or clinic by a specially trained health care professional. Talk to your pediatrician regarding the use of this medicine in children. Special care may be needed. Overdosage: If you think you have taken too much of this medicine contact a poison control center or emergency room at once. NOTE: This medicine is only for you. Do not share this medicine with others. What if I miss a dose? It is important not to miss your dose. Call your doctor or health care professional if you are unable to keep an appointment. What may interact with this medicine? -cyclosporine -diazepam -ketoconazole -medicines to increase blood counts like filgrastim, pegfilgrastim, sargramostim -other chemotherapy drugs like cisplatin, doxorubicin, epirubicin, etoposide, teniposide, vincristine -quinidine -testosterone -vaccines -verapamil Talk to your doctor or health care professional before taking any of these medicines: -acetaminophen -aspirin -ibuprofen -ketoprofen -naproxen This list may not describe all possible interactions. Give your health care provider a list of all the medicines, herbs, non-prescription drugs, or dietary supplements you use. Also tell them if you smoke, drink alcohol, or use illegal drugs. Some items may interact with your medicine. What should I watch for while using this medicine? Your condition will be monitored carefully while you are receiving this medicine. You will need important blood work done while you are taking this medicine. This medicine can cause serious allergic reactions. If you experience allergic reactions like skin rash, itching or hives, swelling of the face, lips, or tongue, tell your  doctor or health care professional right away. In some cases, you  may be given additional medicines to help with side effects. Follow all directions for their use. This drug may make you feel generally unwell. This is not uncommon, as chemotherapy can affect healthy cells as well as cancer cells. Report any side effects. Continue your course of treatment even though you feel ill unless your doctor tells you to stop. Call your doctor or health care professional for advice if you get a fever, chills or sore throat, or other symptoms of a cold or flu. Do not treat yourself. This drug decreases your body's ability to fight infections. Try to avoid being around people who are sick. This medicine may increase your risk to bruise or bleed. Call your doctor or health care professional if you notice any unusual bleeding. Be careful brushing and flossing your teeth or using a toothpick because you may get an infection or bleed more easily. If you have any dental work done, tell your dentist you are receiving this medicine. Avoid taking products that contain aspirin, acetaminophen, ibuprofen, naproxen, or ketoprofen unless instructed by your doctor. These medicines may hide a fever. Do not become pregnant while taking this medicine. Women should inform their doctor if they wish to become pregnant or think they might be pregnant. There is a potential for serious side effects to an unborn child. Talk to your health care professional or pharmacist for more information. Do not breast-feed an infant while taking this medicine. Men are advised not to father a child while receiving this medicine. What side effects may I notice from receiving this medicine? Side effects that you should report to your doctor or health care professional as soon as possible: -allergic reactions like skin rash, itching or hives, swelling of the face, lips, or tongue -low blood counts - This drug may decrease the number of white blood cells, red blood cells and platelets. You may be at increased risk for  infections and bleeding. -signs of infection - fever or chills, cough, sore throat, pain or difficulty passing urine -signs of decreased platelets or bleeding - bruising, pinpoint red spots on the skin, black, tarry stools, nosebleeds -signs of decreased red blood cells - unusually weak or tired, fainting spells, lightheadedness -breathing problems -changes in vision -chest pain -high or low blood pressure -mouth sores -nausea and vomiting -pain, swelling, redness or irritation at the injection site -pain, tingling, numbness in the hands or feet -slow or irregular heartbeat -swelling of the ankle, feet, hands Side effects that usually do not require medical attention (report to your doctor or health care professional if they continue or are bothersome): -aches, pains -changes in the color of fingernails -diarrhea -hair loss -loss of appetite This list may not describe all possible side effects. Call your doctor for medical advice about side effects. You may report side effects to FDA at 1-800-FDA-1088. Where should I keep my medicine? This drug is given in a hospital or clinic and will not be stored at home. NOTE: This sheet is a summary. It may not cover all possible information. If you have questions about this medicine, talk to your doctor, pharmacist, or health care provider.  2018 Elsevier/Gold Standard (2015-07-04 10:05:20)

## 2017-09-03 ENCOUNTER — Encounter: Payer: Self-pay | Admitting: Hematology

## 2017-09-03 ENCOUNTER — Encounter (HOSPITAL_COMMUNITY): Payer: 59 | Admitting: Cardiology

## 2017-09-03 ENCOUNTER — Telehealth: Payer: Self-pay | Admitting: *Deleted

## 2017-09-03 NOTE — Telephone Encounter (Signed)
Called pt & she states that she has tol abraxane OK so far.  She  Mentioned some bleeding when she wiped="mucousy blood tinged".  Informed that this may be more from radiation & to discuss with radiation RN.  Encouraged to call with any problems.  Pt expressed understanding.

## 2017-09-03 NOTE — Telephone Encounter (Signed)
-----   Message from Arman Bogus, RN sent at 09/02/2017  4:52 PM EST ----- Regarding: Burr Medico pt, first time Abraxane, f/u call Follow up call for first time Abraxane pt.  Tolerated well.

## 2017-09-04 ENCOUNTER — Encounter: Payer: Self-pay | Admitting: Radiation Oncology

## 2017-09-04 ENCOUNTER — Ambulatory Visit (HOSPITAL_COMMUNITY): Payer: 59

## 2017-09-10 ENCOUNTER — Telehealth: Payer: Self-pay | Admitting: *Deleted

## 2017-09-10 NOTE — Telephone Encounter (Signed)
CALLED PATIENT TO REMIND OF HDR TX. FOR 09-11-17 @ 9 AM, LVM FOR A RETURN CALL

## 2017-09-11 ENCOUNTER — Ambulatory Visit
Admission: RE | Admit: 2017-09-11 | Discharge: 2017-09-11 | Disposition: A | Discharge status: Home / Self Care | Payer: 59 | Source: Ambulatory Visit | Attending: Radiation Oncology | Admitting: Radiation Oncology

## 2017-09-11 DIAGNOSIS — C50211 Malignant neoplasm of upper-inner quadrant of right female breast: Secondary | ICD-10-CM | POA: Diagnosis not present

## 2017-09-11 DIAGNOSIS — C541 Malignant neoplasm of endometrium: Secondary | ICD-10-CM

## 2017-09-11 LAB — CBC WITH DIFFERENTIAL/PLATELET
BASO%: 0.4 % (ref 0.0–2.0)
BASOS ABS: 0.1 10*3/uL (ref 0.0–0.1)
EOS%: 0.5 % (ref 0.0–7.0)
Eosinophils Absolute: 0.1 10*3/uL (ref 0.0–0.5)
HEMATOCRIT: 32.8 % — AB (ref 34.8–46.6)
HGB: 11.3 g/dL — ABNORMAL LOW (ref 11.6–15.9)
LYMPH#: 3.2 10*3/uL (ref 0.9–3.3)
LYMPH%: 17.6 % (ref 14.0–49.7)
MCH: 35.3 pg — AB (ref 25.1–34.0)
MCHC: 34.4 g/dL (ref 31.5–36.0)
MCV: 102.6 fL — ABNORMAL HIGH (ref 79.5–101.0)
MONO#: 1.8 10*3/uL — AB (ref 0.1–0.9)
MONO%: 10 % (ref 0.0–14.0)
NEUT#: 13 10*3/uL — ABNORMAL HIGH (ref 1.5–6.5)
NEUT%: 71.5 % (ref 38.4–76.8)
PLATELETS: 201 10*3/uL (ref 145–400)
RBC: 3.19 10*6/uL — ABNORMAL LOW (ref 3.70–5.45)
RDW: 17.2 % — ABNORMAL HIGH (ref 11.2–14.5)
WBC: 18.2 10*3/uL — ABNORMAL HIGH (ref 3.9–10.3)

## 2017-09-11 LAB — URINALYSIS, MICROSCOPIC - CHCC
Bilirubin (Urine): NEGATIVE
Blood: NEGATIVE
GLUCOSE UR CHCC: NEGATIVE mg/dL
Ketones: NEGATIVE mg/dL
LEUKOCYTE ESTERASE: NEGATIVE
NITRITE: NEGATIVE
PH: 8 (ref 4.6–8.0)
PROTEIN: NEGATIVE mg/dL
RBC / HPF: NEGATIVE (ref 0–2)
Specific Gravity, Urine: 1.005 (ref 1.003–1.035)
UROBILINOGEN UR: 0.2 mg/dL (ref 0.2–1)

## 2017-09-11 MED FILL — DEXAMETHASONE 4 MG TABLET: 4 | 10 days supply | Qty: 20 | Fill #0

## 2017-09-16 ENCOUNTER — Telehealth: Payer: Self-pay | Admitting: *Deleted

## 2017-09-16 NOTE — Telephone Encounter (Signed)
CALLED PATIENT TO REMIND OF HDR TX. FOR 09-17-17 @ 9 AM @ [place], SPOKE WITH PATIENT AND SHE IS AWARE OF THIS [place].

## 2017-09-17 ENCOUNTER — Ambulatory Visit
Admission: RE | Admit: 2017-09-17 | Discharge: 2017-09-17 | Disposition: A | Discharge status: Home / Self Care | Payer: Medicare Other | Source: Ambulatory Visit | Attending: Radiation Oncology | Admitting: Radiation Oncology

## 2017-09-17 ENCOUNTER — Encounter: Payer: Self-pay | Admitting: Radiation Oncology

## 2017-09-17 DIAGNOSIS — C50211 Malignant neoplasm of upper-inner quadrant of right female breast: Secondary | ICD-10-CM | POA: Insufficient documentation

## 2017-09-17 DIAGNOSIS — C541 Malignant neoplasm of endometrium: Secondary | ICD-10-CM

## 2017-09-17 DIAGNOSIS — Z510 Encounter for antineoplastic radiation therapy: Secondary | ICD-10-CM | POA: Diagnosis not present

## 2017-09-17 DIAGNOSIS — Z170 Estrogen receptor positive status [ER+]: Secondary | ICD-10-CM | POA: Insufficient documentation

## 2017-09-17 NOTE — Progress Notes (Signed)
  Radiation Oncology         [phone] ________________________________  Name: Dawn [name] MRN: [id number]  Date: 09/17/2017  DOB: June 26, 1951  CC: [name], MD  Truitt Merle, MD  HDR BRACHYTHERAPY NOTE  DIAGNOSIS: Endometrial cancer [facility]) Staging form: Corpus Uteri - Adenosarcoma, AJCC 8th Edition - Pathologic stage from 06/09/2017: Stage IIIC (pT1a, pN1, cM0)   Malignant neoplasm of upper-inner quadrant of right breast in female, estrogen receptor positive ([place]) Staging form: Breast, AJCC 8th Edition - Clinical stage from 05/06/2017: Stage IA (cT1c, cN0, cM0, G2, ER: Positive, PR: Negative, HER2: Positive) - Unsigned - Pathologic stage from 05/25/2017: Stage IIA (pT2, pN0, cM0, G2, ER: Positive, PR: Negative, HER2: Negative) - Signed by Truitt Merle, MD on 06/16/2017    Simple treatment device note: Patient had construction of her custom vaginal cylinder. She will be treated with a 3.5 cm diameter segmented cylinder. This conforms to her anatomy without undue discomfort.  Vaginal brachytherapy procedure node: The patient was brought to the [place] suite. Identity was confirmed. All relevant records and images related to the planned course of therapy were reviewed. The patient freely provided informed written consent to proceed with treatment after reviewing the details related to the planned course of therapy. The consent form was witnessed and verified by the simulation staff. Then, the patient was set-up in a stable reproducible supine position for radiation therapy. Pelvic exam revealed the vaginal cuff to be intact. The patient's custom vaginal cylinder was placed in the proximal vagina. This was affixed to the CT/MR stabilization plate to prevent slippage. Patient tolerated the placement well.  Verification simulation note:  A fiducial marker was placed within the vaginal cylinder. An AP and lateral film was then obtained through the pelvis area. This documented accurate position  of the vaginal cylinder for treatment.  HDR BRACHYTHERAPY TREATMENT  The remote afterloading device was affixed to the vaginal cylinder by catheter. Patient then proceeded to undergo her fifth high-dose-rate treatment directed at the proximal vagina. The patient was prescribed a dose of 6 gray to be delivered to the mucosal surface. Treatment length was 4 cm. Patient was treated with 1 channel using 9 dwell positions. Treatment time was 247.2 seconds. Iridium 192 was the high-dose-rate source for treatment. The patient tolerated the treatment well. After completion of her therapy, a radiation survey was performed documenting return of the iridium source into the [employer] safe.   PLAN: Patient will follow up with [name] in approximately 4 weeks.  ________________________________  Blair Promise, PhD, MD   -----------------------------------  Blair Promise, PhD, MD   This document serves as a record of services personally performed by Gery Pray, MD. It was created on his behalf by Marlowe Kays, a trained medical scribe. The creation of this record is based on the scribe's personal observations and the provider's statements to them. This document has been checked and approved by the attending provider.

## 2017-09-21 MED FILL — OLMESARTAN-HCTZ 20-12.5 MG: 20-12.5 | 90 days supply | Qty: 90 | Fill #1

## 2017-09-22 NOTE — Progress Notes (Signed)
[place]  [phone] [phone]  Clinic Follow up Note   Patient Care Team: [name], MD as PCP - Haskell Riling, MD as Consulting Physician (General Surgery) Truitt Merle, MD as Consulting Physician (Hematology) [name], MD as Attending Physician (Radiation Oncology)   Date of Service:  09/23/2017  CHIEF COMPLAINTS:  Follow up right breast cancer and endometrial cancer     Malignant neoplasm of upper-inner quadrant of right breast in female, estrogen receptor positive ([place])   04/27/2017 Initial Biopsy    Diagnosis Breast, right, needle core biopsy INVASIVE DUCTAL CARCINOMA, GRADE 2 Microscopic Comment The neoplasm has intracellular mucin and signet ring cell features. Immunostains shows these cells are positive for ER,GATA3, ck7 and GCDFP, negative for ck20, cdx2, TTF-1 and pax8, The immunostaining pattern supports the neoplasm is breast primary.      04/27/2017 Receptors her2    Estrogen Receptor: 70%, POSITIVE, MODERATE STAINING INTENSITY Progesterone Receptor: 0%, NEGATIVE Proliferation Marker Ki67: 30% HER2 - **POSITIVE** RATIO OF HER2/CEP17 SIGNALS 2.91 AVERAGE HER2 COPY NUMBER PER CELL 7.28      04/27/2017 Initial Diagnosis    Malignant neoplasm of upper-inner quadrant of right breast in female, estrogen receptor positive ([place])       Mammogram         05/07/2017 Imaging    CT Chest W Contrast 05/07/17 IMPRESSION: Tiny well-defined fatty lesion on the pleura at the right lung base. The thinner slice collimation used for today's chest CT eliminates volume-averaging seen in the lesion on the prior exam and confirms that this is a diffusely fatty nodule. This is a benign finding and likely represents a tiny lipoma. No defect in the hemidiaphragm evident to suggest tiny diaphragmatic hernia. Pulmonary hamartoma a consideration although the lack of soft tissue components makes this less likely.        05/15/2017 Genetic Testing    Patient had genetic testing due to a personal history of breast cancer and uterine cancer as well as a family history of cancer. The Multi-Cancer panel was ordered. The Multi-Cancer Panel offered by [facility] includes sequencing and/or deletion duplication testing of the following 83 genes: ALK, APC, ATM, AXIN2,BAP1,  BARD1, BLM, BMPR1A, BRCA1, BRCA2, BRIP1, CASR, CDC73, CDH1, CDK4, CDKN1B, CDKN1C, CDKN2A (p14ARF), CDKN2A (p16INK4a), CEBPA, CHEK2, CTNNA1, DICER1, DIS3L2, EGFR (c.2369C>T, p.Thr790Met variant only), EPCAM (Deletion/duplication testing only), FH, FLCN, GATA2, GPC3, GREM1 (Promoter region deletion/duplication testing only), HOXB13 (c.251G>A, p.Gly84Glu), HRAS, KIT, MAX, MEN1, MET, MITF (c.952G>A, p.Glu318Lys variant only), MLH1, MSH2, MSH3, MSH6, MUTYH, NBN, NF1, NF2, NTHL1, PALB2, PDGFRA, PHOX2B, PMS2, POLD1, POLE, POT1, PRKAR1A, PTCH1, PTEN, RAD50, RAD51C, RAD51D, RB1, RECQL4, RET, RUNX1, SDHAF2, SDHA (sequence changes only), SDHB, SDHC, SDHD, SMAD4, SMARCA4, SMARCB1, SMARCE1, STK11, SUFU, TERC, TERT, TMEM127, TP53, TSC1, TSC2, VHL, WRN and WT1.   Results: No pathogenic mutations were identified. A VUS in the GATA2 gene c.1348G>A (p.Gly450Arg) was identified.  The date of this test report is 05/25/2017.        05/25/2017 Surgery    RIGHT BREAST LUMPECTOMY WITH RADIOACTIVE SEED AND  RIGHT AXILLARY SENTINEL LYMPH NODE BIOPSY and Pot placement by Dr. Excell Seltzer 05/25/17      05/25/2017 Pathology Results    Diagnosis 05/25/17 1. Breast, lumpectomy, right - INVASIVE DUCTAL CARCINOMA, GRADE II/III, SPANNING 2.2 CM. - DUCTAL CARCINOMA IN SITU, HIGH GRADE. - THE SURGICAL RESECTION MARGINS ARE NEGATIVE FOR CARCINOMA. - SEE ONCOLOGY TABLE BELOW. 2. Breast, excision, right additional superior margin - BENIGN FIBROADIPOSE TISSUE. - BENIGN  SKELETAL MUSCLE. - SEE COMMENT. 3. Breast, excision, right additional lateral margin - BENIGN BREAST PARENCHYMA. - THERE IS NO  EVIDENCE OF MALIGNANCY. - SEE COMMENT. 4. Breast, excision, chest wall margin - BENIGN FIBROADIPOSE TISSUE. - THERE IS NO EVIDENCE OF MALIGNANCY. - SEE COMMENT. 5. Lymph node, sentinel, biopsy, right axillary - THERE IS NO EVIDENCE OF CARCINOMA IN 1 OF 1 LYMPH NODE (0/1). 6. Lymph node, sentinel, biopsy, right axillary - THERE IS NO EVIDENCE OF CARCINOMA IN 1 OF 1 LYMPH NODE (0/1). 7. Lymph node, sentinel, biopsy, right axillary - THERE IS NO EVIDENCE OF CARCINOMA IN 1 OF 1 LYMPH NODE (0/1).      06/26/2017 PET scan    PET  IMPRESSION: 1. Postoperative findings both in the right breast and in the anatomic pelvis, with associated low-grade activity considered to be postoperative in nature. No hypermetabolic adenopathy or hypermetabolic lesions are identified to suggest active metastatic disease/malignancy. 2. Other imaging findings of potential clinical significance: Aortic Atherosclerosis (ICD10-I70.0). Sigmoid colon diverticulosis. Biapical pleuroparenchymal scarring in the lungs. Small amount of free pelvic fluid in the cul-de-sac, likely postoperative.       07/01/2017 -  Chemotherapy    Adjuvant TCH with Onpro every 3 weeks for 6 cycles starting on 07/01/17 followed by Herceptin every 3 weeks for 6 months        Endometrial adenocarcinoma ([place])   05/07/2017 Initial Diagnosis    Endometrial adenocarcinoma ([place])      06/09/2017 Surgery    XI ROBOTIC ASSISTED TOTAL LAPOROSCOPIC HYSTERECTOMY WITH BILATERAL SALPINGO OOPHORECTOMY and SENTINEL NODE BIOPSY by Dr. Denman George 06/09/17      06/09/2017 Pathology Results    Diagnosis 06/09/17 1. Lymph node, sentinel, biopsy, right external iliac - METASTATIC ADENOCARCINOMA IN ONE LYMPH NODE (1/1). 2. Lymph node, sentinel, biopsy, left obturator - ONE BENIGN LYMPH NODE (0/1). 3. Lymph node, sentinel, biopsy, left external iliac - METASTATIC ADENOCARCINOMA IN ONE LYMPH NODE (1/1). 4. Uterus +/- tubes/ovaries,  neoplastic ENDOMETRIUM: - ENDOMETRIAL ADENOCARCINOMA, 3.2 CM. - CARCINOMA INVADES INNER HALF OF MYOMETRIUM. 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 - CERVIX, BILATERAL FALLOPIAN TUBES AND BILATERAL OVARIES FREE OF TUMOR      08/18/2017 - 09/17/2017 Radiation Therapy    vaginal brachytherapy per [name] on starting 08/18/17       HISTORY OF PRESENTING ILLNESS (05/06/2017):  Dawn POPPEN 67 y.o. female is here because of newly diagnosed breast cancer. Screening mammogram detected right breast mass on 04/20/2017. Ultrasound showed 1.5 cm mass in the 1:00 position. The axilla was negative on ultrasound. Biopsy of the right breast was performed on 04/27/2017 revealing invasive ductal carcinoma, Grade 2, ER 70% / PR 0% / HER-2+ / Ki-67 30%.   Of note, she was also recently diagnosed with endometrial cancer. Biopsy of curettage endometrium on 04/29/2017 revealed adenocarcinoma. Immunohistology shows the tumor is positive with estrogen receptor, progesterone receptor with patchy positivity with carcinoembryonic antigen and p16. The tumor is negative with CD10 and vimentin. The morphology and immunophenotype are not definitive as to endocervical or endometrial origin. There are rare fragments with endometrial type stroma, which may indicate an endometrial origin although this is not definitive as to origin.  The patient presents today in our multidisciplinary breast clinic. She is doing well overall. She went to her PCP with abdominal pain and vaginal bleeding which felt like she was having her period all the time. Then she presented to the ER with severe vertigo and heavy vaginal bleeding. She has been having  heavy vaginal bleeding since mid-July. She does not have a history of anemia and associates it with recent bleeding. She has been taking oral iron. She took an aspirin daily but, has not been taking it lately. She had been taking Evista for one year and stopped taking it on her own. She was taking  this medication for her bone density. She takes 2-3 Tylenol daily to manage the abdominal pain / cramping discomfort. She believes this pain is associated with passing clots. She takes a multivitamin daily. The patient is apprehensive about chemotherapy and is unsure if she would like to proceed with chemotherapy if it is recommended. She reports leg cramping. She does not take potassium supplements but, does try to eat bananas. States liver function is typically elevated and has been for about the last 2-3 years.   She has never smoked and doesn't drink alcohol. She did grow up with heavy smoking parents.   Father had colon cancer diagnosed at 55 years-old, and he died of bladder cancer. Paternal grandmother with some type of gynecologic cancer, age of onset in her mid-69s. Patient believes there was more cancer in her family in aunts and uncles however, there are no more living family members to discuss this with.   GYN HISTORY  Menarchal: 46 LMP: age of 47 Contraceptive: none  HRT: none  G2P2: She was 69 when she gave first live births.   CURRENT THERAPY:   -Adjuvant TCH with Onpro every 3 weeks for 6 cycles starting 07/01/17. Changed Taxol to abraxane with cycle 4 due to drug rash reaction. Followed by Herceptin every 3 weeks for total of 12 months.  -vaginal brachytherapy with [name] starting 08/18/17, completed on 09/17/2017    INTERVAL HISTORY:  DEZRA MANDELLA is here for a follow up and 5th cycle TCH. She presents to the clinic today accompanied by her husband. She completed vaginal brachytherapy as of yesterday. She is recovering from this well. She has residual loose bowel movements and fatigue from this, but otherwise she is without complaint. She has remedied her fatigue with walking outside and remaining active. Since we last saw her she does report that she has gained approximately 10lbs. She also reports some new neuropathy and her fingertips. This is intermittent overall and she  describes this as a tingling/numbness feeling. She has still retained her dexterity, however, she has lost some of her control of very fine motor movements. She began to notice this since her last cycle of [place].   On review of systems, pt denies fever, chills, mouth sores, weight loss, decreased appetite, urinary complaints. Denies pain. Pt denies abdominal pain, nausea, vomiting. Pertinent positives are listed and detailed within the above HPI.  MEDICAL HISTORY:  Past Medical History:  Diagnosis Date  . Anemia    as a child.  . Arthritis   . Bilateral cataracts   . Bilateral leg cramps   . Cancer ([place])    skin  . Dyspnea   . Family history of bladder cancer   . Family history of colon cancer in father   . Fuchs' corneal dystrophy   . GERD (gastroesophageal reflux disease)   . Hearing loss    Right side 30%  . History of hiatal hernia    small size  . Hyperlipidemia   . Hypertension   . IBS (irritable bowel syndrome)    hx of  . Malignant neoplasm of upper-inner quadrant of right female breast ([place])   . NAFL (nonalcoholic fatty liver)   .  Obesity   . PONV (postoperative nausea and vomiting)   . 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 Uterine cancer ([place])    endometrial cancer  . Varices, gastric   . Vertigo     SURGICAL HISTORY: Past Surgical History:  Procedure Laterality Date  . BREAST BIOPSY Right 04/27/2017  . BREAST LUMPECTOMY WITH RADIOACTIVE SEED AND SENTINEL LYMPH NODE BIOPSY Right 05/25/2017   Procedure: RIGHT BREAST LUMPECTOMY WITH RADIOACTIVE SEED AND  RIGHT AXILLARY SENTINEL LYMPH NODE BIOPSY;  Surgeon: Excell Seltzer, MD;  Location: [facility];  Service: General;  Laterality: Right;  . COLONOSCOPY    . HYSTEROSCOPY W/D&C N/A 04/29/2017   Procedure: DILATATION AND CURETTAGE /HYSTEROSCOPY;  Surgeon: Linda Hedges, DO;  Location: [facility];  Service: Gynecology;  Laterality: N/A;  . PORTACATH PLACEMENT Left 05/25/2017    Procedure: INSERTION PORT-A-CATH;  Surgeon: Excell Seltzer, MD;  Location: [facility];  Service: General;  Laterality: Left;  . ROBOTIC ASSISTED TOTAL HYSTERECTOMY WITH BILATERAL SALPINGO OOPHERECTOMY N/A 06/09/2017   Procedure: XI ROBOTIC ASSISTED TOTAL LAPOROSCOPIC HYSTERECTOMY WITH BILATERAL SALPINGO OOPHORECTOMY;  Surgeon: Everitt Amber, MD;  Location: [facility];  Service: Gynecology;  Laterality: N/A;  . SENTINEL NODE BIOPSY N/A 06/09/2017   Procedure: SENTINEL NODE BIOPSY;  Surgeon: Everitt Amber, MD;  Location: [facility];  Service: Gynecology;  Laterality: N/A;    SOCIAL HISTORY: Social History   Socioeconomic History  . Marital status: Married    Spouse name: Not on file  . Number of children: Not on file  . Years of education: Not on file  . Highest education level: Not on file  Social Needs  . Financial resource strain: Not on file  . Food insecurity - worry: Not on file  . Food insecurity - inability: Not on file  . Transportation needs - medical: Not on file  . Transportation needs - non-medical: Not on file  Occupational History    Employer: [employer]    Comment: [employer]   Tobacco Use  . Smoking status: Never Smoker  . Smokeless tobacco: Never Used  Substance and Sexual Activity  . Alcohol use: Yes    Alcohol/week: 0.0 oz    Comment: <1/week wine or beer occasional  . Drug use: No  . Sexual activity: Yes    Birth control/protection: Post-menopausal  Other Topics Concern  . Not on file  Social History Narrative   2-3 caffeine drinks per day. Regular exercise.  Walking 3 miles a day.     FAMILY HISTORY: Family History  Problem Relation Age of Onset  . Colon cancer Father 47  . Heart attack Father 69  . Prostate cancer Father        dx 34's  . Bladder Cancer Father 90  . Stroke Mother 42  . Diabetes Maternal Grandfather   . Kidney disease Maternal Grandfather   . Asthma Brother   . Cancer Paternal Grandmother 41        GYN cancer ( thinks ovarian, maybe uterine)  . Heart attack Maternal Grandmother 70  . Breast cancer Other 89  . Colon cancer Other 73  . Cancer Other        type unk, age dx unk    ALLERGIES:  is allergic to ciprofloxacin; crestor [rosuvastatin calcium]; and penicillins.  MEDICATIONS:  Current Outpatient Medications  Medication Sig Dispense Refill  . acetaminophen (TYLENOL) 325 MG tablet Take 650 mg by mouth every 6 (  six) hours as needed for mild pain or moderate pain.    . Ascorbic Acid (VITAMIN C) 1000 MG tablet Take 1,000 mg by mouth daily.    . calcium carbonate (TUMS - DOSED IN MG ELEMENTAL CALCIUM) 500 MG chewable tablet Chew 2 tablets by mouth 2 (two) times daily as needed for indigestion or heartburn.    . lidocaine-prilocaine (EMLA) cream Apply to affected area once 30 g 3  . meclizine (ANTIVERT) 25 MG tablet Take 1-2 tablets (25-50 mg total) by mouth 3 (three) times daily as needed for dizziness. (Patient taking differently: Take 25-50 mg by mouth 3 (three) times daily as needed for dizziness (depends on dizziness if takes 1-2 tablets). ) 30 tablet 0  . Multiple Vitamin (MULTIVITAMIN) capsule Take 1 capsule by mouth daily.      Marland Kitchen olmesartan-hydrochlorothiazide (BENICAR HCT) 20-12.5 MG tablet Take 1 tablet by mouth daily. 90 tablet 2  . ondansetron (ZOFRAN) 8 MG tablet Take 1 tablet (8 mg total) by mouth 2 (two) times daily as needed for refractory nausea / vomiting. Start on day 3 after chemo. 30 tablet 1  . oxyCODONE-acetaminophen (PERCOCET/ROXICET) 5-325 MG tablet Take 1-2 tablets by mouth every 4 (four) hours as needed (moderate to severe pain). 30 tablet 0  . Pitavastatin Calcium (LIVALO) 2 MG TABS Take 3 mg by mouth every evening. Takes 1.5 tablet    . prochlorperazine (COMPAZINE) 10 MG tablet Take 1 tablet (10 mg total) by mouth every 6 (six) hours as needed (Nausea or vomiting). 30 tablet 1  . sodium chloride (MURO 128) 5 % ophthalmic ointment Place 1 application into  both eyes 2 (two) times daily.     . traMADol (ULTRAM) 50 MG tablet Take 1 tablet (50 mg total) every 6 (six) hours as needed by mouth. 30 tablet 0  . dexamethasone (DECADRON) 4 MG tablet Take 1 tablet (4 mg total) by mouth daily. Take BID X 2 days, then daily until completed. (Patient not taking: Reported on 09/23/2017) 20 tablet 0   No current facility-administered medications for this visit.     REVIEW OF SYSTEMS:   Constitutional: Denies fevers, chills or abnormal night sweats  (+) complete hair loss (+) fatigue  Eyes: Denies blurriness of vision, double vision or watery eyes Ears, nose, mouth, throat, and face: Denies mucositis or sore throat Respiratory: Denies cough, dyspnea or wheezes Cardiovascular: Denies palpitation, chest discomfort or lower extremity swelling Gastrointestinal:  Denies nausea, vomiting, constipation, diarrhea, heartburn or change in bowel habits Skin: Denies abnormal skin rashes.  Lymphatics: Denies new lymphadenopathy or easy bruising Musculoskeletal: Negative Neurological:Denies numbness, tingling or new weaknesses (+) vertigo Behavioral/Psych: Mood is stable, no new changes  All other systems were reviewed with the patient and are negative.  PHYSICAL EXAMINATION:  ECOG PERFORMANCE STATUS: 1 - Symptomatic but completely ambulatory  Vitals:   09/23/17 0841  BP: (!) 145/73  Pulse: 82  Resp: 20  Temp: 97.9 F (36.6 C)  SpO2: 97%   Filed Weights   09/23/17 0841  Weight: 188 lb 9.6 oz (85.5 kg)     GENERAL:alert, no distress and comfortable SKIN: skin color, texture, turgor are normal, no rashes or significant lesions EYES: normal, conjunctiva are pink and non-injected, sclera clear OROPHARYNX:no exudate, no erythema and lips, buccal mucosa, and tongue normal  NECK: supple, thyroid normal size, non-tender, without nodularity LYMPH:  no palpable lymphadenopathy in the cervical, axillary or inguinal LUNGS: clear to auscultation with normal breathing  effort HEART: regular rate & rhythm  and no lower extremity edema (+) heart murmur in the aortic valve ABDOMEN:abdomen soft, non-tender and normal bowel sounds (+) s/p total hysterectomy and salpingo-oophorectomy, 4 horizontal laparoscopic surgical incisions at the level of the umbilicus, healing well  Musculoskeletal:no cyanosis of digits and no clubbing  PSYCH: alert & oriented x 3 with fluent speech NEURO: no focal motor/sensory deficits BREAST: Left breast shows no palpable mass or adenopathy. Right breast shows no palpable mass or adenopathy.  (+) S/p right lumpectomy, surgical incision in right axilla and around the areola healed well without skin erythema or discharge  LABORATORY DATA:  I have reviewed the data as listed CBC Latest Ref Rng & Units 09/23/2017 09/11/2017 09/02/2017  WBC 3.9 - 10.3 K/uL 3.6(L) 18.2(H) 5.2  Hemoglobin 11.6 - 15.9 g/dL 10.9(L) 11.3(L) 11.5(L)  Hematocrit 34.8 - 46.6 % 31.7(L) 32.8(L) 33.5(L)  Platelets 145 - 400 K/uL 134(L) 201 202    CMP Latest Ref Rng & Units 09/23/2017 09/02/2017 08/12/2017  Glucose 70 - 140 mg/dL 120 96 116  BUN 7 - 26 mg/dL 9 9.0 9.7  Creatinine 0.60 - 1.10 mg/dL 0.79 0.8 0.8  Sodium 136 - 145 mmol/L 138 136 139  Potassium 3.3 - 4.7 mmol/L 3.4 3.3(L) 3.4(L)  Chloride 98 - 109 mmol/L 103 - -  CO2 22 - 29 mmol/L '25 27 26  '$ Calcium 8.4 - 10.4 mg/dL 9.2 9.4 9.3  Total Protein 6.4 - 8.3 g/dL 7.0 7.2 7.0  Total Bilirubin 0.2 - 1.2 mg/dL 0.6 0.57 0.53  Alkaline Phos 40 - 150 U/L 51 57 51  AST 5 - 34 U/L 66(H) 96(H) 59(H)  ALT 0 - 55 U/L 128(H) 144(H) 97(H)   PATHOLOGY FINDINGS:   Diagnosis 06/09/17 1. Lymph node, sentinel, biopsy, right external iliac - METASTATIC ADENOCARCINOMA IN ONE LYMPH NODE (1/1). 2. Lymph node, sentinel, biopsy, left obturator - ONE BENIGN LYMPH NODE (0/1). 3. Lymph node, sentinel, biopsy, left external iliac - METASTATIC ADENOCARCINOMA IN ONE LYMPH NODE (1/1). 4. Uterus +/- tubes/ovaries,  neoplastic ENDOMETRIUM: - ENDOMETRIAL ADENOCARCINOMA, 3.2 CM. - CARCINOMA INVADES INNER HALF OF MYOMETRIUM. 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 - CERVIX, BILATERAL FALLOPIAN TUBES AND BILATERAL OVARIES FREE OF TUMOR. Microscopic Comment 4. ONCOLOGY TABLE-UTERUS, CARCINOMA OR CARCINOSARCOMA Specimen: Uterus with bilateral fallopian tubes and ovaries, right and left external iliac nodes and left obturator lymph node. Procedure: Hysterectomy with bilateral salpingo-oophorectomy and lymph node biopsies. Lymph node sampling performed: Yes. Specimen integrity: Intact. Maximum tumor size: 3.2 cm. Histologic type: Mixed, endometrioid, clear cell and serous. Grade: III. Myometrial invasion: 0.8 cm where myometrium is 2.2 cm in thickness. Cervical stromal involvement: No. Extent of involvement of other organs: N/A. Lymph - vascular invasion: Present. Peritoneal washings: N/A. Lymph nodes: Examined: 3 Sentinel 1 of 3 FINAL for CYRA, SPADER ([id number]) Microscopic Comment(continued) 0 Non-sentinel 3 Total Lymph nodes with metastasis: 2. Isolated tumor cells (< 0.2 mm): 0. Micrometastasis: (> 0.2 mm and < 2.0 mm): 0. Macrometastasis: (> 2.0 mm): 2. Extracapsular extension: No. TNM code: pT1a, pN1. FIGO Stage (based on pathologic findings, needs clinical correlation): IIIC1. Comments: The tumor is 3.2 cm in greatest dimension and invades the myometrium to a depth of 0.8 cm where the myometrium is 2.2 cm in thickness and there is lymphatic vascular involvement by tumor. 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 (JDP:ah 06/10/17)   Diagnosis 05/25/17 1. Breast, lumpectomy, right - INVASIVE DUCTAL CARCINOMA, GRADE II/III, SPANNING 2.2 CM. - DUCTAL CARCINOMA IN SITU, HIGH GRADE. -  THE SURGICAL RESECTION MARGINS ARE NEGATIVE FOR CARCINOMA. - SEE ONCOLOGY TABLE BELOW. 2. Breast, excision, right additional superior margin - BENIGN FIBROADIPOSE TISSUE. -  BENIGN SKELETAL MUSCLE. - SEE COMMENT. 3. Breast, excision, right additional lateral margin - BENIGN BREAST PARENCHYMA. - THERE IS NO EVIDENCE OF MALIGNANCY. - SEE COMMENT. 4. Breast, excision, chest wall margin - BENIGN FIBROADIPOSE TISSUE. - THERE IS NO EVIDENCE OF MALIGNANCY. - SEE COMMENT. 5. Lymph node, sentinel, biopsy, right axillary - THERE IS NO EVIDENCE OF CARCINOMA IN 1 OF 1 LYMPH NODE (0/1). 6. Lymph node, sentinel, biopsy, right axillary - THERE IS NO EVIDENCE OF CARCINOMA IN 1 OF 1 LYMPH NODE (0/1). 7. Lymph node, sentinel, biopsy, right axillary - THERE IS NO EVIDENCE OF CARCINOMA IN 1 OF 1 LYMPH NODE (0/1). Microscopic Comment 1. BREAST, INVASIVE TUMOR Procedure: Seed localized lumpectomy, additional superior, lateral, and chest wall margin resections and axillary lymph node resections. Laterality: Right. Tumor Size: 2.2 cm (gross measurement). Histologic Type: Ductal. Microscopic Comment(continued) Grade: II. Tubular Differentiation: 2. Nuclear Pleomorphism: 2. Mitotic Count: 2. Ductal Carcinoma in Situ (DCIS): Present, high grade. Extent of Tumor: Confined to breast parenchyma. Margins: Greater than or equal to 0.2 cm to all margins. Regional Lymph Nodes: Number of Lymph Nodes Examined: 3. Number of Sentinel Lymph Nodes Examined: 3. Lymph Nodes with Macrometastases: 0. Lymph Nodes with Micrometastases: 0. Lymph Nodes with Isolated Tumor Cells: 0. Breast Prognostic Profile: Case [id number]. Estrogen Receptor: 70%, moderate. Progesterone Receptor: 0%. Her2: Amplification was detected. The ratio was 2.91. Ki-67: 30%. Best tumor block for sendout testing: 1A-1C. Pathologic Stage Classification (pTNM, AJCC 8th Edition): Primary Tumor (pT): pT2. Regional Lymph Nodes (pN): pN0. Distant Metastases (pM): pMX. 2. - 4. The surgical resection margin(s) of the specimen were inked and microscopically evaluated.  Diagnosis 04/29/2017 Endometrium, curettage -  ADENOCARCINOMA. - SEE MICROSCOPIC DESCRIPTION. Microscopic Comment There are fragments of adenocarcinoma, some of which have cribriform architecture, and there is focal extracellular mucin. Immunohistochemistry shows the tumor is positive with estrogen receptor, progesterone receptor with patchy positivity with carcinoembryonic antigen and p16. The tumor is negative with CD10 and vimentin. The morphology and immunophenotype are not definitive as to endocervical or endometrial origin. There are rare fragments with endometrial type stroma, which may indicate an endometrial origin although this is not definitive as to origin.  Diagnosis 04/27/2017 Breast, right, needle core biopsy INVASIVE DUCTAL CARCINOMA, GRADE 2 Microscopic Comment The neoplasm has intracellular mucin and signet ring cell features. Immunostains shows these cells are positive for ER, GATA3, ck7 and GCDFP, negative for ck20, cdx2, TTF-1 and pax8, The immunostaining pattern supports the neoplasm is breast primary. The Breast prognostic profile has been ordered. Results: IMMUNOHISTOCHEMICAL AND MORPHOMETRIC ANALYSIS PERFORMED MANUALLY Estrogen Receptor: 70%, POSITIVE, MODERATE STAINING INTENSITY Progesterone Receptor: 0%, NEGATIVE Proliferation Marker Ki67: 30% Results: HER2 - **POSITIVE** RATIO OF HER2/CEP17 SIGNALS 2.91 AVERAGE HER2 COPY NUMBER PER CELL 7.28  GENETICS 05/07/17   PROCEDURES   ECHO 08/27/17  Study Conclusions - Left ventricle: The cavity size was normal. Wall thickness was   normal. Systolic function was normal. The estimated ejection   fraction was in the range of 60% to 65%. Wall motion was normal;   there were no regional wall motion abnormalities. Doppler   parameters are consistent with abnormal left ventricular   relaxation (grade 1 diastolic dysfunction). GLS -18.6%. - Aortic valve: There was no stenosis. - Mitral valve: There was no regurgitation. - Right ventricle: The cavity size was  mildly dilated. Systolic  function was normal. - Right atrium: The atrium was mildly dilated. - Tricuspid valve: Peak RV-RA gradient (S): 21 mm Hg. - Pulmonary arteries: PA peak pressure: 24 mm Hg (S). - Inferior vena cava: The vessel was normal in size. The   respirophasic diameter changes were in the normal range (>= 50%),   consistent with normal central venous pressure. Impressions: - Normal LV size and systolic function, EF 16-10%. Strain as above.   Mildly dilated RV with normal systolic function.   ECHO 05/07/17 Impressions: - Normal LV size with mild LV hypertrophy. EF 65-70%, vigorous   systolic function. Strain as above. Normal RV size and systolic   function. No significant valvular abnormalities.    RADIOGRAPHIC STUDIES: I have personally reviewed the radiological images as listed and agreed with the findings in the report. No results found.  ASSESSMENT & PLAN:  67 y.o. 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 Malignant neoplasm of upper inner quadrant of right breast , Invasive ductal carcinoma, pT2N0M0, G2,  ER+/PR-/HER2+ -She underwent right breast lumpectomy with sentinel lymph node biopsy with Dr. Excell Seltzer on 05/25/2017 with port placement -Pathology confirms invasive ductal carcinoma, grade 2, spanning 2.2 cm, margins were clear, no positive lymph nodes, ER positive, PR negative, HER-2 positive. I reviewed this with the patient -We discussed the risk of cancer recurrence after completed surgical resection.  Due to the HER-2 positive disease, she is at moderate to high risk.  I recommend adjuvant chemotherapy.  The regiment was selected to cover for endometrial cancer also. -She has started adjuvant TCH every 3 weeks with Onpro on 07/01/17. Experienced rash and significant  bone pain. I discussed if she continue to have reaction we can switch her Taxol to abraxane. I called in  Tramadol for bone pain. Bone pain now controlled.   -Starting with cycle 4 will change her taxol to abraxane given her previously drug rash reaction  -Labs reviewed, mild anemia. Liver enzymes have remained elevated, but overall stable. Adequate for treatment today. If her Hgb drops below 8 we will transfuse PRBC.  -She will proceed with cycle 5 TCH today.  She has developed a mild peripheral neuropathy, will monitor closely. -I have encouraged her to begin taking B-complex with her multivitamins to help with her mild neuropathy.   2. Endometrial adenocarcinoma with lymph node metastasis, pT1ApN1M0, FIGO stage IIIc -She underwent a total hysterectomy with salpingo-oophorectomy  by Dr. Denman George on 06/09/17 -We discussed her surgical pathology. she has high risk features including lymphovascular involvement, nodal positive disease, and mixed histology with endometrioid, clear cell, and serous carcinoma  -We discussed the high risk of cancer recurrence after surgery extensively, and I strongly encouraged her to consider adjuvant chemotherapy to reduce her risk of recurrence. -she has started adjuvant Taxol plus carboplatin every 3 weeks 6 cycles, which is a standard adjuvant chemotherapy regimen -I sent a message to rad/onc Drs. Isidore Moos and Kinard to coordinate her adjuvant brachytherapy, we will likely start soon, with concurrent chemotherapy -She completed vaginal brachytherapy by [name] on 12/4-1/3. This was tolerated well. Some residual side effects, but she is recovering well overall.   3. Genetics -Given her personal history of breast and endometrial cancer, and a family history of malignancy, she will be referred to see a genetic counselor for genetic testing to ruled out cancer syndromes. -Genetics results were negative   4. HTN -She is on HCTZ and I explained  that chemo can effect her BP so if her BP drops we will hold her HCTZ.  -Pt holds her HCTZ before chemo treatment as this can effect  her chemo.   5. Vertigo  -Symptoms have returned after surgery  -She will restart her Meclizine.   6. Insurance and Emotional support  -referred to our financial support with [employer]umm[employer]  -She feels saddened by her diagnosis. She declined chaplin and therapy services at this time.  -she has FMLA through November for surgery, she will provide paperwork to extend [facility] through chemotherapy if necessary   7. Elevated LFTs - lFTs are chronically elevated due to fatty liver disease -Will need to monitor her liver functions closely while on chemo.  -Her ALT and AST are slightly worse today, repeat is normal, still adequate for treatment, will continue monitoring  8. Drug Rash -secondary to Taxol, started one week after first cycle chemo  -treated with Benadryl, Pepcid and Decadron, resolved  -I called in dexa, she will take '4mg'$  daily for 5 days after chemo  -no drug rash occurred with her dexa after cycle 2. She will shorten steroid course to 1 tablet daily times 3 days after chemo, if she remains free of skin rash will consider discontinuing steroids with cycle 4.  -Rash has resolved, She will use dexa only if rash returns.  -Changed taxol to abraxane with cycle 4.  She still had mild skin rash after chemotherapy, resolved after 3 days of steroids. -She knows to take dexamethasone as needed after chemo for skin rash.  9. Bone pain from Onpro -I prescribed tramadol today, she will use as needed for moderate-to-severe pain -She will continue Claritin daily for 5 days after the injection  -mild bone pain controlled with Claritin, Tylenol, and Advil as needed. -She has not needed her tramadol.   Follow-up: -Labs reviewed; adequate for treatment.  We will proceed to cycle 5 of [place] today with same dose  -She will f/u as scheduled for her next treatment and clinic visit in 3 weeks for last cycle chemo   No orders of the defined types were placed in this encounter.   All questions were  answered. The patient knows to call the clinic with any problems, questions or concerns. I spent 25 minutes counseling the patient face to face. The total time spent in the appointment was 30 minutes and more than 50% was on counseling.    Truitt Merle  09/23/2017   This document serves as a record of services personally performed by Truitt Merle, MD. It was created on her behalf by Reola Mosher, a trained medical scribe. The creation of this record is based on the scribe's personal observations and the provider's statements to them. This document has been checked and approved by the attending provider.  I have reviewed the above documentation for accuracy and completeness, and I agree with the above.

## 2017-09-23 ENCOUNTER — Inpatient Hospital Stay: Payer: Medicare Other

## 2017-09-23 ENCOUNTER — Encounter: Payer: Self-pay | Admitting: Hematology

## 2017-09-23 ENCOUNTER — Inpatient Hospital Stay: Payer: Medicare Other | Attending: Hematology | Admitting: Hematology

## 2017-09-23 VITALS — BP 145/73 | HR 82 | Temp 97.9°F | Resp 20 | Ht 63.0 in | Wt 188.6 lb

## 2017-09-23 DIAGNOSIS — C50211 Malignant neoplasm of upper-inner quadrant of right female breast: Secondary | ICD-10-CM | POA: Diagnosis not present

## 2017-09-23 DIAGNOSIS — Z5112 Encounter for antineoplastic immunotherapy: Secondary | ICD-10-CM | POA: Diagnosis not present

## 2017-09-23 DIAGNOSIS — Z170 Estrogen receptor positive status [ER+]: Secondary | ICD-10-CM

## 2017-09-23 DIAGNOSIS — M810 Age-related osteoporosis without current pathological fracture: Secondary | ICD-10-CM

## 2017-09-23 DIAGNOSIS — L270 Generalized skin eruption due to drugs and medicaments taken internally: Secondary | ICD-10-CM | POA: Insufficient documentation

## 2017-09-23 DIAGNOSIS — Z800 Family history of malignant neoplasm of digestive organs: Secondary | ICD-10-CM | POA: Diagnosis not present

## 2017-09-23 DIAGNOSIS — C541 Malignant neoplasm of endometrium: Secondary | ICD-10-CM

## 2017-09-23 DIAGNOSIS — Z5111 Encounter for antineoplastic chemotherapy: Secondary | ICD-10-CM | POA: Insufficient documentation

## 2017-09-23 DIAGNOSIS — Z95828 Presence of other vascular implants and grafts: Secondary | ICD-10-CM

## 2017-09-23 DIAGNOSIS — T50905D Adverse effect of unspecified drugs, medicaments and biological substances, subsequent encounter: Secondary | ICD-10-CM | POA: Insufficient documentation

## 2017-09-23 LAB — CBC WITH DIFFERENTIAL/PLATELET
ABS GRANULOCYTE: 1.4 10*3/uL — AB (ref 1.5–6.5)
BASOS ABS: 0 10*3/uL (ref 0.0–0.1)
Basophils Relative: 1 %
EOS PCT: 1 %
Eosinophils Absolute: 0 10*3/uL (ref 0.0–0.5)
HCT: 31.7 % — ABNORMAL LOW (ref 34.8–46.6)
Hemoglobin: 10.9 g/dL — ABNORMAL LOW (ref 11.6–15.9)
LYMPHS PCT: 44 %
Lymphs Abs: 1.6 10*3/uL (ref 0.9–3.3)
MCH: 36.1 pg — ABNORMAL HIGH (ref 25.1–34.0)
MCHC: 34.3 g/dL (ref 31.5–36.0)
MCV: 105.3 fL — AB (ref 79.5–101.0)
MONO ABS: 0.5 10*3/uL (ref 0.1–0.9)
Monocytes Relative: 15 %
NEUTROS PCT: 39 %
Neutro Abs: 1.4 10*3/uL — ABNORMAL LOW (ref 1.5–6.5)
PLATELETS: 134 10*3/uL — AB (ref 145–400)
RBC: 3.01 MIL/uL — AB (ref 3.70–5.45)
RDW: 18.2 % — AB (ref 11.2–16.1)
WBC: 3.6 10*3/uL — AB (ref 3.9–10.3)

## 2017-09-23 LAB — COMPREHENSIVE METABOLIC PANEL
ALT: 128 U/L — AB (ref 0–55)
AST: 66 U/L — AB (ref 5–34)
Albumin: 3.7 g/dL (ref 3.5–5.0)
Alkaline Phosphatase: 51 U/L (ref 40–150)
Anion gap: 10 (ref 3–11)
BUN: 9 mg/dL (ref 7–26)
CHLORIDE: 103 mmol/L (ref 98–109)
CO2: 25 mmol/L (ref 22–29)
CREATININE: 0.79 mg/dL (ref 0.60–1.10)
Calcium: 9.2 mg/dL (ref 8.4–10.4)
GFR calc Af Amer: >60 mL/min (ref >60)
GLUCOSE: 120 mg/dL (ref 70–140)
POTASSIUM: 3.4 mmol/L (ref 3.3–4.7)
SODIUM: 138 mmol/L (ref 136–145)
Total Bilirubin: 0.6 mg/dL (ref 0.2–1.2)
Total Protein: 7 g/dL (ref 6.4–8.3)

## 2017-09-23 MED ORDER — HEPARIN SOD (PORK) LOCK FLUSH 100 UNIT/ML IV SOLN
500.0000 [IU] | Freq: Once | INTRAVENOUS | Status: DC | PRN
  Filled 2017-09-23: qty 5

## 2017-09-23 MED ORDER — ACETAMINOPHEN 325 MG PO TABS
650.0000 mg | ORAL_TABLET | Freq: Once | ORAL | Status: AC
  Administered 2017-09-23: 650 mg via ORAL

## 2017-09-23 MED ORDER — TRASTUZUMAB CHEMO 150 MG IV SOLR
6.0000 mg/kg | Freq: Once | INTRAVENOUS | Status: AC
  Administered 2017-09-23: 525 mg via INTRAVENOUS
  Filled 2017-09-23: qty 25

## 2017-09-23 MED ORDER — PACLITAXEL PROTEIN-BOUND CHEMO INJECTION 100 MG
175.0000 mg/m2 | Freq: Once | INTRAVENOUS | Status: AC
  Administered 2017-09-23: 350 mg via INTRAVENOUS
  Filled 2017-09-23: qty 70

## 2017-09-23 MED ORDER — SODIUM CHLORIDE 0.9 % IV SOLN
607.2000 mg | Freq: Once | INTRAVENOUS | Status: AC
  Administered 2017-09-23: 610 mg via INTRAVENOUS
  Filled 2017-09-23: qty 61

## 2017-09-23 MED ORDER — SODIUM CHLORIDE 0.9 % IV SOLN
Freq: Once | INTRAVENOUS | Status: AC
  Administered 2017-09-23: 10:00:00 via INTRAVENOUS
  Filled 2017-09-23: qty 5

## 2017-09-23 MED ORDER — DIPHENHYDRAMINE HCL 25 MG PO CAPS
ORAL_CAPSULE | ORAL | Status: AC
  Filled 2017-09-23: qty 2

## 2017-09-23 MED ORDER — PEGFILGRASTIM 6 MG/0.6ML ~~LOC~~ PSKT
6.0000 mg | PREFILLED_SYRINGE | Freq: Once | SUBCUTANEOUS | Status: AC
  Administered 2017-09-23: 6 mg via SUBCUTANEOUS

## 2017-09-23 MED ORDER — ACETAMINOPHEN 325 MG PO TABS
ORAL_TABLET | ORAL | Status: AC
  Filled 2017-09-23: qty 2

## 2017-09-23 MED ORDER — PALONOSETRON HCL INJECTION 0.25 MG/5ML
0.2500 mg | Freq: Once | INTRAVENOUS | Status: AC
  Administered 2017-09-23: 0.25 mg via INTRAVENOUS

## 2017-09-23 MED ORDER — SODIUM CHLORIDE 0.9% FLUSH
10.0000 mL | INTRAVENOUS | Status: DC | PRN
  Administered 2017-09-23: 10 mL via INTRAVENOUS
  Filled 2017-09-23: qty 10

## 2017-09-23 MED ORDER — SODIUM CHLORIDE 0.9 % IV SOLN
Freq: Once | INTRAVENOUS | Status: AC
  Administered 2017-09-23: 10:00:00 via INTRAVENOUS

## 2017-09-23 MED ORDER — PEGFILGRASTIM 6 MG/0.6ML ~~LOC~~ PSKT
PREFILLED_SYRINGE | SUBCUTANEOUS | Status: AC
Start: 2017-09-23 — End: 2017-09-23
  Filled 2017-09-23: qty 0.6

## 2017-09-23 MED ORDER — PALONOSETRON HCL INJECTION 0.25 MG/5ML
INTRAVENOUS | Status: AC
  Filled 2017-09-23: qty 5

## 2017-09-23 MED ORDER — DIPHENHYDRAMINE HCL 25 MG PO CAPS
50.0000 mg | ORAL_CAPSULE | Freq: Once | ORAL | Status: AC
  Administered 2017-09-23: 50 mg via ORAL

## 2017-09-23 MED ORDER — SODIUM CHLORIDE 0.9% FLUSH
10.0000 mL | INTRAVENOUS | Status: DC | PRN
  Filled 2017-09-23: qty 10

## 2017-09-23 NOTE — Patient Instructions (Addendum)
[facility] Discharge Instructions for Patients Receiving Chemotherapy  Today you received the following chemotherapy agents: Herceptin, Abraxane, and Carboplatin  To help prevent nausea and vomiting after your treatment, we encourage you to take your nausea medication as directed.   If you develop nausea and vomiting that is not controlled by your nausea medication, call the clinic.   BELOW ARE SYMPTOMS THAT SHOULD BE REPORTED IMMEDIATELY:  *FEVER GREATER THAN 100.5 F  *CHILLS WITH OR WITHOUT FEVER  NAUSEA AND VOMITING THAT IS NOT CONTROLLED WITH YOUR NAUSEA MEDICATION  *UNUSUAL SHORTNESS OF BREATH  *UNUSUAL BRUISING OR BLEEDING  TENDERNESS IN MOUTH AND THROAT WITH OR WITHOUT PRESENCE OF ULCERS  *URINARY PROBLEMS  *BOWEL PROBLEMS  UNUSUAL RASH Items with * indicate a potential emergency and should be followed up as soon as possible.  Feel free to call the clinic should you have any questions or concerns. The clinic phone number is [phone].  Please show the [place] at check-in to the Emergency Department and triage nurse.

## 2017-09-23 NOTE — Progress Notes (Signed)
[name] saw pt and all lab results today prior to chemo.  OK to treat per [name].

## 2017-09-25 ENCOUNTER — Telehealth: Payer: Self-pay | Admitting: Hematology

## 2017-09-25 NOTE — Telephone Encounter (Signed)
Per 1/9 no los °

## 2017-10-12 NOTE — Progress Notes (Signed)
[place]  [phone] [phone]  Clinic Follow up Note   Patient Care Team: [name], MD as PCP - Haskell Riling, MD as Consulting Physician (General Surgery) Truitt Merle, MD as Consulting Physician (Hematology) [name], MD as Attending Physician (Radiation Oncology)   Date of Service:  10/14/2017  CHIEF COMPLAINTS:  Follow up right breast cancer and endometrial cancer     Malignant neoplasm of upper-inner quadrant of right breast in female, estrogen receptor positive ([place])   04/27/2017 Initial Biopsy    Diagnosis Breast, right, needle core biopsy INVASIVE DUCTAL CARCINOMA, GRADE 2 Microscopic Comment The neoplasm has intracellular mucin and signet ring cell features. Immunostains shows these cells are positive for ER,GATA3, ck7 and GCDFP, negative for ck20, cdx2, TTF-1 and pax8, The immunostaining pattern supports the neoplasm is breast primary.      04/27/2017 Receptors her2    Estrogen Receptor: 70%, POSITIVE, MODERATE STAINING INTENSITY Progesterone Receptor: 0%, NEGATIVE Proliferation Marker Ki67: 30% HER2 - **POSITIVE** RATIO OF HER2/CEP17 SIGNALS 2.91 AVERAGE HER2 COPY NUMBER PER CELL 7.28      04/27/2017 Initial Diagnosis    Malignant neoplasm of upper-inner quadrant of right breast in female, estrogen receptor positive ([place])       Mammogram         05/07/2017 Imaging    CT Chest W Contrast 05/07/17 IMPRESSION: Tiny well-defined fatty lesion on the pleura at the right lung base. The thinner slice collimation used for today's chest CT eliminates volume-averaging seen in the lesion on the prior exam and confirms that this is a diffusely fatty nodule. This is a benign finding and likely represents a tiny lipoma. No defect in the hemidiaphragm evident to suggest tiny diaphragmatic hernia. Pulmonary hamartoma a consideration although the lack of soft tissue components makes this less likely.      05/15/2017 Genetic Testing    Patient had genetic testing due to a personal history of breast cancer and uterine cancer as well as a family history of cancer. The Multi-Cancer panel was ordered. The Multi-Cancer Panel offered by [facility] includes sequencing and/or deletion duplication testing of the following 83 genes: ALK, APC, ATM, AXIN2,BAP1,  BARD1, BLM, BMPR1A, BRCA1, BRCA2, BRIP1, CASR, CDC73, CDH1, CDK4, CDKN1B, CDKN1C, CDKN2A (p14ARF), CDKN2A (p16INK4a), CEBPA, CHEK2, CTNNA1, DICER1, DIS3L2, EGFR (c.2369C>T, p.Thr790Met variant only), EPCAM (Deletion/duplication testing only), FH, FLCN, GATA2, GPC3, GREM1 (Promoter region deletion/duplication testing only), HOXB13 (c.251G>A, p.Gly84Glu), HRAS, KIT, MAX, MEN1, MET, MITF (c.952G>A, p.Glu318Lys variant only), MLH1, MSH2, MSH3, MSH6, MUTYH, NBN, NF1, NF2, NTHL1, PALB2, PDGFRA, PHOX2B, PMS2, POLD1, POLE, POT1, PRKAR1A, PTCH1, PTEN, RAD50, RAD51C, RAD51D, RB1, RECQL4, RET, RUNX1, SDHAF2, SDHA (sequence changes only), SDHB, SDHC, SDHD, SMAD4, SMARCA4, SMARCB1, SMARCE1, STK11, SUFU, TERC, TERT, TMEM127, TP53, TSC1, TSC2, VHL, WRN and WT1.   Results: No pathogenic mutations were identified. A VUS in the GATA2 gene c.1348G>A (p.Gly450Arg) was identified.  The date of this test report is 05/25/2017.        05/25/2017 Surgery    RIGHT BREAST LUMPECTOMY WITH RADIOACTIVE SEED AND  RIGHT AXILLARY SENTINEL LYMPH NODE BIOPSY and Pot placement by Dr. Excell Seltzer 05/25/17      05/25/2017 Pathology Results    Diagnosis 05/25/17 1. Breast, lumpectomy, right - INVASIVE DUCTAL CARCINOMA, GRADE II/III, SPANNING 2.2 CM. - DUCTAL CARCINOMA IN SITU, HIGH GRADE. - THE SURGICAL RESECTION MARGINS ARE NEGATIVE FOR CARCINOMA. - SEE ONCOLOGY TABLE BELOW. 2. Breast, excision, right additional superior margin - BENIGN FIBROADIPOSE TISSUE. - BENIGN SKELETAL MUSCLE. -  SEE COMMENT. 3. Breast, excision, right additional lateral margin - BENIGN BREAST PARENCHYMA. - THERE IS NO  EVIDENCE OF MALIGNANCY. - SEE COMMENT. 4. Breast, excision, chest wall margin - BENIGN FIBROADIPOSE TISSUE. - THERE IS NO EVIDENCE OF MALIGNANCY. - SEE COMMENT. 5. Lymph node, sentinel, biopsy, right axillary - THERE IS NO EVIDENCE OF CARCINOMA IN 1 OF 1 LYMPH NODE (0/1). 6. Lymph node, sentinel, biopsy, right axillary - THERE IS NO EVIDENCE OF CARCINOMA IN 1 OF 1 LYMPH NODE (0/1). 7. Lymph node, sentinel, biopsy, right axillary - THERE IS NO EVIDENCE OF CARCINOMA IN 1 OF 1 LYMPH NODE (0/1).      06/26/2017 PET scan    PET  IMPRESSION: 1. Postoperative findings both in the right breast and in the anatomic pelvis, with associated low-grade activity considered to be postoperative in nature. No hypermetabolic adenopathy or hypermetabolic lesions are identified to suggest active metastatic disease/malignancy. 2. Other imaging findings of potential clinical significance: Aortic Atherosclerosis (ICD10-I70.0). Sigmoid colon diverticulosis. Biapical pleuroparenchymal scarring in the lungs. Small amount of free pelvic fluid in the cul-de-sac, likely postoperative.       07/01/2017 - 10/14/2017 Chemotherapy    Adjuvant TCH with Onpro every 3 weeks for 6 cycles starting on 07/01/17, Changed Taxol to abraxane with cycle 4 due to drug rash reaction, followed by Herceptin every 3 weeks for 6 months        Endometrial adenocarcinoma ([place])   05/07/2017 Initial Diagnosis    Endometrial adenocarcinoma ([place])      06/09/2017 Surgery    XI ROBOTIC ASSISTED TOTAL LAPOROSCOPIC HYSTERECTOMY WITH BILATERAL SALPINGO OOPHORECTOMY and SENTINEL NODE BIOPSY by Dr. Denman George 06/09/17      06/09/2017 Pathology Results    Diagnosis 06/09/17 1. Lymph node, sentinel, biopsy, right external iliac - METASTATIC ADENOCARCINOMA IN ONE LYMPH NODE (1/1). 2. Lymph node, sentinel, biopsy, left obturator - ONE BENIGN LYMPH NODE (0/1). 3. Lymph node, sentinel, biopsy, left external iliac - METASTATIC ADENOCARCINOMA IN  ONE LYMPH NODE (1/1). 4. Uterus +/- tubes/ovaries, neoplastic ENDOMETRIUM: - ENDOMETRIAL ADENOCARCINOMA, 3.2 CM. - CARCINOMA INVADES INNER HALF OF MYOMETRIUM. 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 - CERVIX, BILATERAL FALLOPIAN TUBES AND BILATERAL OVARIES FREE OF TUMOR      08/18/2017 - 09/17/2017 Radiation Therapy    vaginal brachytherapy per [name] on starting 08/18/17       HISTORY OF PRESENTING ILLNESS (05/06/2017):  Dawn [name] 68 y.o. female is here because of newly diagnosed breast cancer. Screening mammogram detected right breast mass on 04/20/2017. Ultrasound showed 1.5 cm mass in the 1:00 position. The axilla was negative on ultrasound. Biopsy of the right breast was performed on 04/27/2017 revealing invasive ductal carcinoma, Grade 2, ER 70% / PR 0% / HER-2+ / Ki-67 30%.   Of note, she was also recently diagnosed with endometrial cancer. Biopsy of curettage endometrium on 04/29/2017 revealed adenocarcinoma. Immunohistology shows the tumor is positive with estrogen receptor, progesterone receptor with patchy positivity with carcinoembryonic antigen and p16. The tumor is negative with CD10 and vimentin. The morphology and immunophenotype are not definitive as to endocervical or endometrial origin. There are rare fragments with endometrial type stroma, which may indicate an endometrial origin although this is not definitive as to origin.  The patient presents today in our multidisciplinary breast clinic. She is doing well overall. She went to her PCP with abdominal pain and vaginal bleeding which felt like she was having her period all the time. Then she presented to the ER with severe  vertigo and heavy vaginal bleeding. She has been having heavy vaginal bleeding since mid-July. She does not have a history of anemia and associates it with recent bleeding. She has been taking oral iron. She took an aspirin daily but, has not been taking it lately. She had been taking Evista for one  year and stopped taking it on her own. She was taking this medication for her bone density. She takes 2-3 Tylenol daily to manage the abdominal pain / cramping discomfort. She believes this pain is associated with passing clots. She takes a multivitamin daily. The patient is apprehensive about chemotherapy and is unsure if she would like to proceed with chemotherapy if it is recommended. She reports leg cramping. She does not take potassium supplements but, does try to eat bananas. States liver function is typically elevated and has been for about the last 2-3 years.   She has never smoked and doesn't drink alcohol. She did grow up with heavy smoking parents.   Father had colon cancer diagnosed at 29 years-old, and he died of bladder cancer. Paternal grandmother with some type of gynecologic cancer, age of onset in her mid-25s. Patient believes there was more cancer in her family in aunts and uncles however, there are no more living family members to discuss this with.   GYN HISTORY  Menarchal: 5 LMP: age of 75 Contraceptive: none  HRT: none  G2P2: She was 6 when she gave first live births.   CURRENT THERAPY:   -Adjuvant TCH with Onpro every 3 weeks for 6 cycles starting 07/01/17. Changed Taxol to abraxane with cycle 4 due to drug rash reaction, Completed on 10/14/17. Followed by Herceptin every 3 weeks for total of 12 months.  -vaginal brachytherapy with [name] starting 08/18/17, completed on 09/17/2017    INTERVAL HISTORY:  Dawn [name] is here for a follow up and 6th cycle TCH. She presents to the clinic today accompanied by her husband. She reports she had a recent fall 6 days ago and has some pain on her left side from her rib area to her left-sided back. She states the fall is not related to her disease, she got tangled up in the straps of her purpose and fell on concrete. She is able to breathe normally and has been using neosporin and soap and water to clean the wound on her left  knee. She reports she was able to tolerate her last cycle of Vian and had no rash. She uses benadryl instead of decadron. 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 She is able to complete tasks but she has mild difficultly using her left hand. She has an appointment to start breast radiation on 11/03/17. She states she has a temporary filling that will need permanent filing. She also needs to have cataract surgery.   On review of systems, pt denies fever, or any other complaints at this time. Pertinent positives are listed and detailed within the above HPI.   MEDICAL HISTORY:  Past Medical History:  Diagnosis Date  . Anemia    as a child.  . Arthritis   . Bilateral cataracts   . Bilateral leg cramps   . Cancer ([place])    skin  . Dyspnea   . Family history of bladder cancer   . Family history of colon cancer in father   . Fuchs' corneal dystrophy   . GERD (gastroesophageal reflux disease)   . Hearing loss    Right side 30%  .  History of hiatal hernia    small size  . Hyperlipidemia   . Hypertension   . IBS (irritable bowel syndrome)    hx of  . Malignant neoplasm of upper-inner quadrant of right female breast ([place])   . NAFL (nonalcoholic fatty liver)   . Obesity   . PONV (postoperative nausea and vomiting)   . 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 Uterine cancer ([place])    endometrial cancer  . Varices, gastric   . Vertigo     SURGICAL HISTORY: Past Surgical History:  Procedure Laterality Date  . BREAST BIOPSY Right 04/27/2017  . BREAST LUMPECTOMY WITH RADIOACTIVE SEED AND SENTINEL LYMPH NODE BIOPSY Right 05/25/2017   Procedure: RIGHT BREAST LUMPECTOMY WITH RADIOACTIVE SEED AND  RIGHT AXILLARY SENTINEL LYMPH NODE BIOPSY;  Surgeon: Excell Seltzer, MD;  Location: [facility];  Service: General;  Laterality: Right;  . COLONOSCOPY    . HYSTEROSCOPY W/D&C N/A 04/29/2017   Procedure: DILATATION AND CURETTAGE  /HYSTEROSCOPY;  Surgeon: Linda Hedges, DO;  Location: [facility];  Service: Gynecology;  Laterality: N/A;  . PORTACATH PLACEMENT Left 05/25/2017   Procedure: INSERTION PORT-A-CATH;  Surgeon: Excell Seltzer, MD;  Location: [facility];  Service: General;  Laterality: Left;  . ROBOTIC ASSISTED TOTAL HYSTERECTOMY WITH BILATERAL SALPINGO OOPHERECTOMY N/A 06/09/2017   Procedure: XI ROBOTIC ASSISTED TOTAL LAPOROSCOPIC HYSTERECTOMY WITH BILATERAL SALPINGO OOPHORECTOMY;  Surgeon: Everitt Amber, MD;  Location: [facility];  Service: Gynecology;  Laterality: N/A;  . SENTINEL NODE BIOPSY N/A 06/09/2017   Procedure: SENTINEL NODE BIOPSY;  Surgeon: Everitt Amber, MD;  Location: [facility];  Service: Gynecology;  Laterality: N/A;    SOCIAL HISTORY: Social History   Socioeconomic History  . Marital status: Married    Spouse name: Not on file  . Number of children: Not on file  . Years of education: Not on file  . Highest education level: Not on file  Social Needs  . Financial resource strain: Not on file  . Food insecurity - worry: Not on file  . Food insecurity - inability: Not on file  . Transportation needs - medical: Not on file  . Transportation needs - non-medical: Not on file  Occupational History    Employer: [employer]    Comment: [employer]   Tobacco Use  . Smoking status: Never Smoker  . Smokeless tobacco: Never Used  Substance and Sexual Activity  . Alcohol use: Yes    Alcohol/week: 0.0 oz    Comment: <1/week wine or beer occasional  . Drug use: No  . Sexual activity: Yes    Birth control/protection: Post-menopausal  Other Topics Concern  . Not on file  Social History Narrative   2-3 caffeine drinks per day. Regular exercise.  Walking 3 miles a day.     FAMILY HISTORY: Family History  Problem Relation Age of Onset  . Colon cancer Father 18  . Heart attack Father 82  . Prostate cancer Father        dx 71's  . Bladder Cancer Father 63  . Stroke  Mother 53  . Diabetes Maternal Grandfather   . Kidney disease Maternal Grandfather   . Asthma Brother   . Cancer Paternal Grandmother 14       GYN cancer ( thinks ovarian, maybe uterine)  . Heart attack Maternal Grandmother 70  . Breast cancer Other 27  . Colon cancer Other 73  . Cancer Other  type unk, age dx unk    ALLERGIES:  is allergic to ciprofloxacin; crestor [rosuvastatin calcium]; and penicillins.  MEDICATIONS:  Current Outpatient Medications  Medication Sig Dispense Refill  . acetaminophen (TYLENOL) 325 MG tablet Take 650 mg by mouth every 6 (six) hours as needed for mild pain or moderate pain.    . Ascorbic Acid (VITAMIN C) 1000 MG tablet Take 1,000 mg by mouth daily.    . calcium carbonate (TUMS - DOSED IN MG ELEMENTAL CALCIUM) 500 MG chewable tablet Chew 2 tablets by mouth 2 (two) times daily as needed for indigestion or heartburn.    . lidocaine-prilocaine (EMLA) cream Apply to affected area once 30 g 3  . meclizine (ANTIVERT) 25 MG tablet Take 1-2 tablets (25-50 mg total) by mouth 3 (three) times daily as needed for dizziness. (Patient taking differently: Take 25-50 mg by mouth 3 (three) times daily as needed for dizziness (depends on dizziness if takes 1-2 tablets). ) 30 tablet 0  . Multiple Vitamin (MULTIVITAMIN) capsule Take 1 capsule by mouth daily.      Marland Kitchen olmesartan-hydrochlorothiazide (BENICAR HCT) 20-12.5 MG tablet Take 1 tablet by mouth daily. 90 tablet 2  . ondansetron (ZOFRAN) 8 MG tablet Take 1 tablet (8 mg total) by mouth 2 (two) times daily as needed for refractory nausea / vomiting. Start on day 3 after chemo. 30 tablet 1  . oxyCODONE-acetaminophen (PERCOCET/ROXICET) 5-325 MG tablet Take 1-2 tablets by mouth every 4 (four) hours as needed (moderate to severe pain). 30 tablet 0  . Pitavastatin Calcium (LIVALO) 2 MG TABS Take 3 mg by mouth every evening. Takes 1.5 tablet    . prochlorperazine (COMPAZINE) 10 MG tablet Take 1 tablet (10 mg total) by mouth  every 6 (six) hours as needed (Nausea or vomiting). 30 tablet 1  . sodium chloride (MURO 128) 5 % ophthalmic ointment Place 1 application into both eyes 2 (two) times daily.     . traMADol (ULTRAM) 50 MG tablet Take 1 tablet (50 mg total) every 6 (six) hours as needed by mouth. 30 tablet 0  . dexamethasone (DECADRON) 4 MG tablet Take 1 tablet (4 mg total) by mouth daily. Take BID X 2 days, then daily until completed. (Patient not taking: Reported on 09/23/2017) 20 tablet 0   No current facility-administered medications for this visit.     REVIEW OF SYSTEMS:   Constitutional: Denies fevers, chills or abnormal night sweats  (+) complete hair loss (+) fatigue  Eyes: Denies blurriness of vision, double vision or watery eyes Ears, nose, mouth, throat, and face: Denies mucositis or sore throat Respiratory: Denies cough, dyspnea or wheezes Cardiovascular: Denies palpitation, chest discomfort or lower extremity swelling Gastrointestinal:  Denies nausea, vomiting, constipation, diarrhea, heartburn or change in bowel habits Skin: Denies abnormal skin rashes.  Lymphatics: Denies new lymphadenopathy or easy bruising Musculoskeletal: Negative Neurological:Denies numbness, new weaknesses (+) vertigo (+) neuropathy in fingers and feet Behavioral/Psych: Mood is stable, no new changes  MSK: (+) left rib and back pain All other systems were reviewed with the patient and are negative.  PHYSICAL EXAMINATION:  ECOG PERFORMANCE STATUS: 1 - Symptomatic but completely ambulatory  Vitals:   10/14/17 0958  BP: 136/70  Pulse: 79  Resp: 18  Temp: 97.8 F (36.6 C)  SpO2: 99%   Filed Weights   10/14/17 0958  Weight: 188 lb 9.6 oz (85.5 kg)     GENERAL:alert, no distress and comfortable SKIN: skin color, texture, turgor are normal, no rashes or significant lesions  EYES: normal, conjunctiva are pink and non-injected, sclera clear OROPHARYNX:no exudate, no erythema and lips, buccal mucosa, and tongue normal    NECK: supple, thyroid normal size, non-tender, without nodularity LYMPH:  no palpable lymphadenopathy in the cervical, axillary or inguinal LUNGS: clear to auscultation with normal breathing effort HEART: regular rate & rhythm and no lower extremity edema (+) heart murmur in the aortic valve ABDOMEN:abdomen soft, non-tender and normal bowel sounds (+) s/p total hysterectomy and salpingo-oophorectomy, 4 horizontal laparoscopic surgical incisions at the level of the umbilicus, healing well  Musculoskeletal:no cyanosis of digits and no clubbing  PSYCH: alert & oriented x 3 with fluent speech NEURO: no focal motor/sensory deficits BREAST: Left breast shows no palpable mass or adenopathy. Right breast shows no palpable mass or adenopathy.  (+) S/p right lumpectomy, surgical incision in right axilla and around the areola healed well without skin erythema or discharge  LABORATORY DATA:  I have reviewed the data as listed CBC Latest Ref Rng & Units 10/14/2017 09/23/2017 09/11/2017  WBC 3.9 - 10.3 K/uL 3.7(L) 3.6(L) 18.2(H)  Hemoglobin 11.6 - 15.9 g/dL 10.7(L) 10.9(L) 11.3(L)  Hematocrit 34.8 - 46.6 % 31.0(L) 31.7(L) 32.8(L)  Platelets 145 - 400 K/uL 133(L) 134(L) 201    CMP Latest Ref Rng & Units 10/14/2017 09/23/2017 09/02/2017  Glucose 70 - 140 mg/dL 110 120 96  BUN 7 - 26 mg/dL 11 9 9.0  Creatinine 0.60 - 1.10 mg/dL 0.78 0.79 0.8  Sodium 136 - 145 mmol/L 137 138 136  Potassium 3.3 - 4.7 mmol/L 3.4 3.4 3.3(L)  Chloride 98 - 109 mmol/L 100 103 -  CO2 22 - 29 mmol/L '27 25 27  '$ Calcium 8.4 - 10.4 mg/dL 9.4 9.2 9.4  Total Protein 6.4 - 8.3 g/dL 7.0 7.0 7.2  Total Bilirubin 0.2 - 1.2 mg/dL 0.5 0.6 0.57  Alkaline Phos 40 - 150 U/L 60 51 57  AST 5 - 34 U/L 72(H) 66(H) 96(H)  ALT 0 - 55 U/L 127(H) 128(H) 144(H)   PATHOLOGY FINDINGS:   Diagnosis 06/09/17 1. Lymph node, sentinel, biopsy, right external iliac - METASTATIC ADENOCARCINOMA IN ONE LYMPH NODE (1/1). 2. Lymph node, sentinel, biopsy, left  obturator - ONE BENIGN LYMPH NODE (0/1). 3. Lymph node, sentinel, biopsy, left external iliac - METASTATIC ADENOCARCINOMA IN ONE LYMPH NODE (1/1). 4. Uterus +/- tubes/ovaries, neoplastic ENDOMETRIUM: - ENDOMETRIAL ADENOCARCINOMA, 3.2 CM. - CARCINOMA INVADES INNER HALF OF MYOMETRIUM. 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 - CERVIX, BILATERAL FALLOPIAN TUBES AND BILATERAL OVARIES FREE OF TUMOR. Microscopic Comment 4. ONCOLOGY TABLE-UTERUS, CARCINOMA OR CARCINOSARCOMA Specimen: Uterus with bilateral fallopian tubes and ovaries, right and left external iliac nodes and left obturator lymph node. Procedure: Hysterectomy with bilateral salpingo-oophorectomy and lymph node biopsies. Lymph node sampling performed: Yes. Specimen integrity: Intact. Maximum tumor size: 3.2 cm. Histologic type: Mixed, endometrioid, clear cell and serous. Grade: III. Myometrial invasion: 0.8 cm where myometrium is 2.2 cm in thickness. Cervical stromal involvement: No. Extent of involvement of other organs: N/A. Lymph - vascular invasion: Present. Peritoneal washings: N/A. Lymph nodes: Examined: 3 Sentinel 1 of 3 FINAL for TYJAH, HAI ([id number]) Microscopic Comment(continued) 0 Non-sentinel 3 Total Lymph nodes with metastasis: 2. Isolated tumor cells (< 0.2 mm): 0. Micrometastasis: (> 0.2 mm and < 2.0 mm): 0. Macrometastasis: (> 2.0 mm): 2. Extracapsular extension: No. TNM code: pT1a, pN1. FIGO Stage (based on pathologic findings, needs clinical correlation): IIIC1. Comments: The tumor is 3.2 cm in greatest dimension and invades the myometrium to a depth of  0.8 cm where the myometrium is 2.2 cm in thickness and there is lymphatic vascular involvement by tumor. 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 (JDP:ah 06/10/17)   Diagnosis 05/25/17 1. Breast, lumpectomy, right - INVASIVE DUCTAL CARCINOMA, GRADE II/III, SPANNING 2.2 CM. - DUCTAL CARCINOMA IN SITU, HIGH  GRADE. - THE SURGICAL RESECTION MARGINS ARE NEGATIVE FOR CARCINOMA. - SEE ONCOLOGY TABLE BELOW. 2. Breast, excision, right additional superior margin - BENIGN FIBROADIPOSE TISSUE. - BENIGN SKELETAL MUSCLE. - SEE COMMENT. 3. Breast, excision, right additional lateral margin - BENIGN BREAST PARENCHYMA. - THERE IS NO EVIDENCE OF MALIGNANCY. - SEE COMMENT. 4. Breast, excision, chest wall margin - BENIGN FIBROADIPOSE TISSUE. - THERE IS NO EVIDENCE OF MALIGNANCY. - SEE COMMENT. 5. Lymph node, sentinel, biopsy, right axillary - THERE IS NO EVIDENCE OF CARCINOMA IN 1 OF 1 LYMPH NODE (0/1). 6. Lymph node, sentinel, biopsy, right axillary - THERE IS NO EVIDENCE OF CARCINOMA IN 1 OF 1 LYMPH NODE (0/1). 7. Lymph node, sentinel, biopsy, right axillary - THERE IS NO EVIDENCE OF CARCINOMA IN 1 OF 1 LYMPH NODE (0/1). Microscopic Comment 1. BREAST, INVASIVE TUMOR Procedure: Seed localized lumpectomy, additional superior, lateral, and chest wall margin resections and axillary lymph node resections. Laterality: Right. Tumor Size: 2.2 cm (gross measurement). Histologic Type: Ductal. Microscopic Comment(continued) Grade: II. Tubular Differentiation: 2. Nuclear Pleomorphism: 2. Mitotic Count: 2. Ductal Carcinoma in Situ (DCIS): Present, high grade. Extent of Tumor: Confined to breast parenchyma. Margins: Greater than or equal to 0.2 cm to all margins. Regional Lymph Nodes: Number of Lymph Nodes Examined: 3. Number of Sentinel Lymph Nodes Examined: 3. Lymph Nodes with Macrometastases: 0. Lymph Nodes with Micrometastases: 0. Lymph Nodes with Isolated Tumor Cells: 0. Breast Prognostic Profile: Case [id number]. Estrogen Receptor: 70%, moderate. Progesterone Receptor: 0%. Her2: Amplification was detected. The ratio was 2.91. Ki-67: 30%. Best tumor block for sendout testing: 1A-1C. Pathologic Stage Classification (pTNM, AJCC 8th Edition): Primary Tumor (pT): pT2. Regional Lymph Nodes (pN):  pN0. Distant Metastases (pM): pMX. 2. - 4. The surgical resection margin(s) of the specimen were inked and microscopically evaluated.  Diagnosis 04/29/2017 Endometrium, curettage - ADENOCARCINOMA. - SEE MICROSCOPIC DESCRIPTION. Microscopic Comment There are fragments of adenocarcinoma, some of which have cribriform architecture, and there is focal extracellular mucin. Immunohistochemistry shows the tumor is positive with estrogen receptor, progesterone receptor with patchy positivity with carcinoembryonic antigen and p16. The tumor is negative with CD10 and vimentin. The morphology and immunophenotype are not definitive as to endocervical or endometrial origin. There are rare fragments with endometrial type stroma, which may indicate an endometrial origin although this is not definitive as to origin.  Diagnosis 04/27/2017 Breast, right, needle core biopsy INVASIVE DUCTAL CARCINOMA, GRADE 2 Microscopic Comment The neoplasm has intracellular mucin and signet ring cell features. Immunostains shows these cells are positive for ER, GATA3, ck7 and GCDFP, negative for ck20, cdx2, TTF-1 and pax8, The immunostaining pattern supports the neoplasm is breast primary. The Breast prognostic profile has been ordered. Results: IMMUNOHISTOCHEMICAL AND MORPHOMETRIC ANALYSIS PERFORMED MANUALLY Estrogen Receptor: 70%, POSITIVE, MODERATE STAINING INTENSITY Progesterone Receptor: 0%, NEGATIVE Proliferation Marker Ki67: 30% Results: HER2 - **POSITIVE** RATIO OF HER2/CEP17 SIGNALS 2.91 AVERAGE HER2 COPY NUMBER PER CELL 7.28  GENETICS 05/07/17   PROCEDURES   ECHO 08/27/17  Study Conclusions - Left ventricle: The cavity size was normal. Wall thickness was   normal. Systolic function was normal. The estimated ejection   fraction was in the range of 60% to 65%. Wall motion  was normal;   there were no regional wall motion abnormalities. Doppler   parameters are consistent with abnormal left  ventricular   relaxation (grade 1 diastolic dysfunction). GLS -18.6%. - Aortic valve: There was no stenosis. - Mitral valve: There was no regurgitation. - Right ventricle: The cavity size was mildly dilated. Systolic   function was normal. - Right atrium: The atrium was mildly dilated. - Tricuspid valve: Peak RV-RA gradient (S): 21 mm Hg. - Pulmonary arteries: PA peak pressure: 24 mm Hg (S). - Inferior vena cava: The vessel was normal in size. The   respirophasic diameter changes were in the normal range (>= 50%),   consistent with normal central venous pressure. Impressions: - Normal LV size and systolic function, EF 01-75%. Strain as above.   Mildly dilated RV with normal systolic function.   ECHO 05/07/17 Impressions: - Normal LV size with mild LV hypertrophy. EF 65-70%, vigorous   systolic function. Strain as above. Normal RV size and systolic   function. No significant valvular abnormalities.    RADIOGRAPHIC STUDIES: I have personally reviewed the radiological images as listed and agreed with the findings in the report. No results found.  ASSESSMENT & PLAN:  67 y.o. 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 Malignant neoplasm of upper inner quadrant of right breast , Invasive ductal carcinoma, pT2N0M0, G2,  ER+/PR-/HER2+ -She underwent right breast lumpectomy with sentinel lymph node biopsy with Dr. Excell Seltzer on 05/25/2017 with port placement -Pathology confirms invasive ductal carcinoma, grade 2, spanning 2.2 cm, margins were clear, no positive lymph nodes, ER positive, PR negative, HER-2 positive. I reviewed this with the patient -We discussed the risk of cancer recurrence after completed surgical resection.  Due to the HER-2 positive disease, she is at moderate to high risk.  I recommend adjuvant chemotherapy.  The regiment was selected to cover for endometrial cancer also. -She has started  adjuvant TCH every 3 weeks with Onpro on 07/01/17. Experienced rash and significant  bone pain. I discussed if she continue to have reaction we can switch her Taxol to abraxane. I called in Tramadol for bone pain. Bone pain now controlled.   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  -She is tolerating chemotherapy well, mild neuropathy stable.  Labs reviewed today (10/14/17), her counts are stable she will proceed with cycle 6 of [place] -We will start Herceptin maintenance therapy in 3 months, for a total of 12 months. -She will start radiation on her breast on 11/01/17 -We will start her on aromatase inhibitor after she completes adjuvant radiation. -F/u in 3 weeks  2. Endometrial adenocarcinoma with lymph node metastasis, pT1ApN1M0, FIGO stage IIIc -She underwent a total hysterectomy with salpingo-oophorectomy  by Dr. Denman George on 06/09/17 -We discussed her surgical pathology. she has high risk features including lymphovascular involvement, nodal positive disease, and mixed histology with endometrioid, clear cell, and serous carcinoma  -We discussed the high risk of cancer recurrence after surgery extensively, and I strongly encouraged her to consider adjuvant chemotherapy to reduce her risk of recurrence. -she has started adjuvant Taxol plus carboplatin every 3 weeks 6 cycles, which is a standard adjuvant chemotherapy regimen -I sent a message to rad/onc Drs. Isidore Moos and Kinard to coordinate her adjuvant brachytherapy, we will likely start soon,  with concurrent chemotherapy -She completed vaginal brachytherapy by [name] on 12/4-1/3. This was tolerated well. Some residual side effects, but she is recovering well overall.   3. Genetics -Given her personal history of breast and endometrial cancer, and a family history of malignancy,  she will be referred to see a genetic counselor for genetic testing to ruled out cancer syndromes. -Genetics results were negative   4. HTN -She is on HCTZ and I explained that chemo can effect her BP so if her BP drops we will hold her HCTZ.  -Pt holds her HCTZ before chemo treatment as this can effect her chemo.   5. Vertigo  -Symptoms have returned after surgery  -She will restart her Meclizine.   6. Insurance and Emotional support  -referred to our financial support with [employer]  -She feels saddened by her diagnosis. She declined chaplin and therapy services at this time.  -she has FMLA through November for surgery, she will provide paperwork to extend [facility] through chemotherapy if necessary   7. Elevated LFTs - lFTs are chronically elevated due to fatty liver disease -Will need to monitor her liver functions closely while on chemo.  -Her ALT and AST are slightly worse today, repeat is normal, still adequate for treatment, will continue monitoring  8. Drug Rash -secondary to Taxol, started one week after first cycle chemo  -treated with Benadryl, Pepcid and Decadron, resolved  -I called in dexa, she will take '4mg'$  daily for 5 days after chemo  -no drug rash occurred with her dexa after cycle 2. She will shorten steroid course to 1 tablet daily times 3 days after chemo, if she remains free of skin rash will consider discontinuing steroids with cycle 4.  -Rash has resolved, She will use dexa only if rash returns.  -Changed taxol to abraxane with cycle 4.  She still had mild skin rash after chemotherapy, resolved after 3 days of steroids. -She knows to take dexamethasone as needed after chemo for skin rash.  9. Bone pain from Onpro -I prescribed tramadol today, she will use as needed for moderate-to-severe pain -She will continue Claritin daily for 5 days after the injection  -mild bone pain controlled with Claritin, Tylenol, and Advil as needed. -She has not needed her tramadol.    Follow-up: -Labs reviewed; adequate for treatment.  We will proceed to cycle 6 of [place] today with same dose  -Lab, flush, f/u and herceptin in 3 weeks  -herceptin in 6 and 9 weeks   No orders of the defined types were placed in this encounter.   All questions were answered. The patient knows to call the clinic with any problems, questions or concerns. I spent 20 minutes counseling the patient face to face. The total time spent in the appointment was 25 minutes and more than 50% was on counseling.    Truitt Merle  10/14/2017 5:30 PM  This document serves as a record of services personally performed by Truitt Merle, MD. It was created on her behalf by Theresia Bough, a trained medical scribe. The creation of this record is based on the scribe's personal observations and the provider's statements to them.   I have reviewed the above documentation for accuracy and completeness, and I agree with the above.

## 2017-10-14 ENCOUNTER — Inpatient Hospital Stay: Payer: Medicare Other

## 2017-10-14 ENCOUNTER — Inpatient Hospital Stay (HOSPITAL_BASED_OUTPATIENT_CLINIC_OR_DEPARTMENT_OTHER): Payer: Medicare Other | Admitting: Hematology

## 2017-10-14 ENCOUNTER — Encounter: Payer: Self-pay | Admitting: Hematology

## 2017-10-14 ENCOUNTER — Encounter: Payer: Self-pay | Admitting: *Deleted

## 2017-10-14 VITALS — BP 136/70 | HR 79 | Temp 97.8°F | Resp 18 | Ht 63.0 in | Wt 188.6 lb

## 2017-10-14 DIAGNOSIS — Z170 Estrogen receptor positive status [ER+]: Secondary | ICD-10-CM

## 2017-10-14 DIAGNOSIS — Z5112 Encounter for antineoplastic immunotherapy: Secondary | ICD-10-CM | POA: Diagnosis not present

## 2017-10-14 DIAGNOSIS — L270 Generalized skin eruption due to drugs and medicaments taken internally: Secondary | ICD-10-CM

## 2017-10-14 DIAGNOSIS — R7401 Elevation of levels of liver transaminase levels: Secondary | ICD-10-CM

## 2017-10-14 DIAGNOSIS — C50211 Malignant neoplasm of upper-inner quadrant of right female breast: Secondary | ICD-10-CM | POA: Diagnosis not present

## 2017-10-14 DIAGNOSIS — Z800 Family history of malignant neoplasm of digestive organs: Secondary | ICD-10-CM

## 2017-10-14 DIAGNOSIS — M810 Age-related osteoporosis without current pathological fracture: Secondary | ICD-10-CM

## 2017-10-14 DIAGNOSIS — C541 Malignant neoplasm of endometrium: Secondary | ICD-10-CM

## 2017-10-14 DIAGNOSIS — Z5111 Encounter for antineoplastic chemotherapy: Secondary | ICD-10-CM | POA: Diagnosis not present

## 2017-10-14 DIAGNOSIS — R740 Nonspecific elevation of levels of transaminase and lactic acid dehydrogenase [LDH]: Secondary | ICD-10-CM

## 2017-10-14 DIAGNOSIS — Z95828 Presence of other vascular implants and grafts: Secondary | ICD-10-CM

## 2017-10-14 LAB — COMPREHENSIVE METABOLIC PANEL
ALK PHOS: 60 U/L (ref 40–150)
ALT: 127 U/L — ABNORMAL HIGH (ref 0–55)
AST: 72 U/L — ABNORMAL HIGH (ref 5–34)
Albumin: 3.8 g/dL (ref 3.5–5.0)
Anion gap: 10 (ref 3–11)
BILIRUBIN TOTAL: 0.5 mg/dL (ref 0.2–1.2)
BUN: 11 mg/dL (ref 7–26)
CALCIUM: 9.4 mg/dL (ref 8.4–10.4)
CO2: 27 mmol/L (ref 22–29)
CREATININE: 0.78 mg/dL (ref 0.60–1.10)
Chloride: 100 mmol/L (ref 98–109)
GFR calc non Af Amer: >60 mL/min (ref >60)
GLUCOSE: 110 mg/dL (ref 70–140)
Potassium: 3.4 mmol/L (ref 3.3–4.7)
SODIUM: 137 mmol/L (ref 136–145)
Total Protein: 7 g/dL (ref 6.4–8.3)

## 2017-10-14 LAB — CBC WITH DIFFERENTIAL/PLATELET
BASOS PCT: 1 %
Basophils Absolute: 0 10*3/uL (ref 0.0–0.1)
EOS ABS: 0.1 10*3/uL (ref 0.0–0.5)
Eosinophils Relative: 1 %
HCT: 31 % — ABNORMAL LOW (ref 34.8–46.6)
Hemoglobin: 10.7 g/dL — ABNORMAL LOW (ref 11.6–15.9)
Lymphocytes Relative: 40 %
Lymphs Abs: 1.5 10*3/uL (ref 0.9–3.3)
MCH: 37.5 pg — ABNORMAL HIGH (ref 25.1–34.0)
MCHC: 34.5 g/dL (ref 31.5–36.0)
MCV: 108.8 fL — ABNORMAL HIGH (ref 79.5–101.0)
MONO ABS: 0.8 10*3/uL (ref 0.1–0.9)
MONOS PCT: 21 %
NEUTROS PCT: 37 %
Neutro Abs: 1.4 10*3/uL — ABNORMAL LOW (ref 1.5–6.5)
Platelets: 133 10*3/uL — ABNORMAL LOW (ref 145–400)
RBC: 2.85 MIL/uL — ABNORMAL LOW (ref 3.70–5.45)
RDW: 16.5 % — AB (ref 11.2–16.1)
WBC: 3.7 10*3/uL — ABNORMAL LOW (ref 3.9–10.3)

## 2017-10-14 MED ORDER — SODIUM CHLORIDE 0.9% FLUSH
10.0000 mL | INTRAVENOUS | Status: DC | PRN
  Administered 2017-10-14: 10 mL via INTRAVENOUS
  Filled 2017-10-14: qty 10

## 2017-10-14 MED ORDER — PALONOSETRON HCL INJECTION 0.25 MG/5ML
INTRAVENOUS | Status: AC
Start: 2017-10-14 — End: 2017-10-14
  Filled 2017-10-14: qty 5

## 2017-10-14 MED ORDER — DIPHENHYDRAMINE HCL 25 MG PO CAPS
50.0000 mg | ORAL_CAPSULE | Freq: Once | ORAL | Status: AC
  Administered 2017-10-14: 50 mg via ORAL

## 2017-10-14 MED ORDER — PEGFILGRASTIM 6 MG/0.6ML ~~LOC~~ PSKT
PREFILLED_SYRINGE | SUBCUTANEOUS | Status: AC
  Filled 2017-10-14: qty 0.6

## 2017-10-14 MED ORDER — SODIUM CHLORIDE 0.9 % IV SOLN
607.2000 mg | Freq: Once | INTRAVENOUS | Status: AC
  Administered 2017-10-14: 610 mg via INTRAVENOUS
  Filled 2017-10-14: qty 61

## 2017-10-14 MED ORDER — SODIUM CHLORIDE 0.9 % IV SOLN
Freq: Once | INTRAVENOUS | Status: AC
  Administered 2017-10-14: 11:00:00 via INTRAVENOUS
  Filled 2017-10-14: qty 5

## 2017-10-14 MED ORDER — DIPHENHYDRAMINE HCL 25 MG PO CAPS
ORAL_CAPSULE | ORAL | Status: AC
  Filled 2017-10-14: qty 2

## 2017-10-14 MED ORDER — PACLITAXEL PROTEIN-BOUND CHEMO INJECTION 100 MG
175.0000 mg/m2 | Freq: Once | INTRAVENOUS | Status: AC
  Administered 2017-10-14: 350 mg via INTRAVENOUS
  Filled 2017-10-14: qty 70

## 2017-10-14 MED ORDER — SODIUM CHLORIDE 0.9 % IV SOLN
Freq: Once | INTRAVENOUS | Status: AC
  Administered 2017-10-14: 11:00:00 via INTRAVENOUS

## 2017-10-14 MED ORDER — ACETAMINOPHEN 325 MG PO TABS
ORAL_TABLET | ORAL | Status: AC
  Filled 2017-10-14: qty 2

## 2017-10-14 MED ORDER — TRASTUZUMAB CHEMO 150 MG IV SOLR
6.0000 mg/kg | Freq: Once | INTRAVENOUS | Status: AC
  Administered 2017-10-14: 525 mg via INTRAVENOUS
  Filled 2017-10-14: qty 25

## 2017-10-14 MED ORDER — ACETAMINOPHEN 325 MG PO TABS
650.0000 mg | ORAL_TABLET | Freq: Once | ORAL | Status: AC
  Administered 2017-10-14: 650 mg via ORAL

## 2017-10-14 MED ORDER — HEPARIN SOD (PORK) LOCK FLUSH 100 UNIT/ML IV SOLN
500.0000 [IU] | Freq: Once | INTRAVENOUS | Status: AC | PRN
  Administered 2017-10-14: 500 [IU]
  Filled 2017-10-14: qty 5

## 2017-10-14 MED ORDER — PEGFILGRASTIM 6 MG/0.6ML ~~LOC~~ PSKT
6.0000 mg | PREFILLED_SYRINGE | Freq: Once | SUBCUTANEOUS | Status: AC
  Administered 2017-10-14: 6 mg via SUBCUTANEOUS

## 2017-10-14 MED ORDER — SODIUM CHLORIDE 0.9% FLUSH
10.0000 mL | INTRAVENOUS | Status: DC | PRN
  Administered 2017-10-14: 10 mL
  Filled 2017-10-14: qty 10

## 2017-10-14 MED ORDER — PALONOSETRON HCL INJECTION 0.25 MG/5ML
0.2500 mg | Freq: Once | INTRAVENOUS | Status: AC
  Administered 2017-10-14: 0.25 mg via INTRAVENOUS

## 2017-10-14 NOTE — Patient Instructions (Signed)
[facility] Discharge Instructions for Patients Receiving Chemotherapy  Today you received the following chemotherapy agents Herceptin, Abraxane, Carboplatin  To help prevent nausea and vomiting after your treatment, we encourage you to take your nausea medication as directed   If you develop nausea and vomiting that is not controlled by your nausea medication, call the clinic.   BELOW ARE SYMPTOMS THAT SHOULD BE REPORTED IMMEDIATELY:  *FEVER GREATER THAN 100.5 F  *CHILLS WITH OR WITHOUT FEVER  NAUSEA AND VOMITING THAT IS NOT CONTROLLED WITH YOUR NAUSEA MEDICATION  *UNUSUAL SHORTNESS OF BREATH  *UNUSUAL BRUISING OR BLEEDING  TENDERNESS IN MOUTH AND THROAT WITH OR WITHOUT PRESENCE OF ULCERS  *URINARY PROBLEMS  *BOWEL PROBLEMS  UNUSUAL RASH Items with * indicate a potential emergency and should be followed up as soon as possible.  Feel free to call the clinic should you have any questions or concerns. The clinic phone number is [phone].  Please show the [facility] at check-in to the Emergency Department and triage nurse.

## 2017-10-15 ENCOUNTER — Telehealth: Payer: Self-pay | Admitting: Hematology

## 2017-10-15 NOTE — Telephone Encounter (Signed)
Patient scheduled and called with appointments.  Calendar mailed to home address

## 2017-10-20 ENCOUNTER — Ambulatory Visit: Payer: Self-pay | Admitting: Radiation Oncology

## 2017-10-29 ENCOUNTER — Encounter: Payer: Self-pay | Admitting: Radiation Oncology

## 2017-11-03 ENCOUNTER — Ambulatory Visit
Admission: RE | Admit: 2017-11-03 | Discharge: 2017-11-03 | Disposition: A | Discharge status: Home / Self Care | Payer: Medicare Other | Source: Ambulatory Visit | Attending: Radiation Oncology | Admitting: Radiation Oncology

## 2017-11-03 ENCOUNTER — Encounter: Payer: Self-pay | Admitting: *Deleted

## 2017-11-03 ENCOUNTER — Encounter: Payer: Self-pay | Admitting: Radiation Oncology

## 2017-11-03 VITALS — BP 127/73 | HR 86 | Temp 98.4°F | Ht 63.0 in | Wt 190.0 lb

## 2017-11-03 DIAGNOSIS — C50211 Malignant neoplasm of upper-inner quadrant of right female breast: Secondary | ICD-10-CM | POA: Insufficient documentation

## 2017-11-03 DIAGNOSIS — Z888 Allergy status to other drugs, medicaments and biological substances status: Secondary | ICD-10-CM | POA: Diagnosis not present

## 2017-11-03 DIAGNOSIS — Z79891 Long term (current) use of opiate analgesic: Secondary | ICD-10-CM | POA: Diagnosis not present

## 2017-11-03 DIAGNOSIS — Z79899 Other long term (current) drug therapy: Secondary | ICD-10-CM | POA: Insufficient documentation

## 2017-11-03 DIAGNOSIS — Z170 Estrogen receptor positive status [ER+]: Secondary | ICD-10-CM

## 2017-11-03 DIAGNOSIS — C775 Secondary and unspecified malignant neoplasm of intrapelvic lymph nodes: Secondary | ICD-10-CM | POA: Diagnosis not present

## 2017-11-03 DIAGNOSIS — Z880 Allergy status to penicillin: Secondary | ICD-10-CM | POA: Insufficient documentation

## 2017-11-03 DIAGNOSIS — Z90710 Acquired absence of both cervix and uterus: Secondary | ICD-10-CM | POA: Insufficient documentation

## 2017-11-03 DIAGNOSIS — Z8542 Personal history of malignant neoplasm of other parts of uterus: Secondary | ICD-10-CM | POA: Diagnosis not present

## 2017-11-03 DIAGNOSIS — Z510 Encounter for antineoplastic radiation therapy: Secondary | ICD-10-CM | POA: Diagnosis not present

## 2017-11-03 DIAGNOSIS — Z9889 Other specified postprocedural states: Secondary | ICD-10-CM | POA: Insufficient documentation

## 2017-11-03 DIAGNOSIS — C541 Malignant neoplasm of endometrium: Secondary | ICD-10-CM | POA: Insufficient documentation

## 2017-11-03 HISTORY — DX: Personal history of irradiation: Z92.3

## 2017-11-03 NOTE — Progress Notes (Signed)
Radiation Oncology         [phone] ________________________________  Name: Dawn [name] MRN: [id number]  Date: 11/03/2017  DOB: 21-Feb-1951  Follow-Up Visit Note  Outpatient  CC: [name], MD  [name], *  Diagnosis:      ICD-10-CM   1. Malignant neoplasm of upper-inner quadrant of right breast in female, estrogen receptor positive ([place]) C50.211    Z17.0      Cancer Staging Endometrial cancer [facility]) Staging form: Corpus Uteri - Adenosarcoma, AJCC 8th Edition - Pathologic stage from 06/09/2017: Stage IIIC (pT1a, pN1, cM0) - Signed by Truitt Merle, MD on 06/16/2017  Malignant neoplasm of upper-inner quadrant of right breast in female, estrogen receptor positive ([place]) Staging form: Breast, AJCC 8th Edition - Clinical stage from 05/06/2017: Stage IA (cT1c, cN0, cM0, G2, ER: Positive, PR: Negative, HER2: Positive) - Unsigned - Pathologic stage from 05/25/2017: Stage IIA (pT2, pN0, cM0, G2, ER: Positive, PR: Negative, HER2: Negative) - Signed by Truitt Merle, MD on 06/16/2017   CHIEF COMPLAINT: Here to discuss management of right breast cancer and concurrent endometrial cancer  Narrative:  The patient returns today for follow-up.    Since consultation, she underwent right breast lumpectomy with lymph node biopsy on 05/25/2017. 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 Margins and 3 right axillary lymph nodes were all negative.  She also underwent  total laparoscopic hysterectomy with bilateral salpingo oophorectomy on 06/09/2017. 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 Cervix, bilateral fallopian tubes, and bilateral ovaries were all free of tumor. One right external iliac lymph node and one left external iliac lymph node were both positive for metastatic adenocarcinoma. The left  obturator lymph node was negative.  PET scan on 06/26/2017 showed postoperative findings both in the right breast and in the pelvis, with associated low-grade activity considered to be postoperative in nature. No other hypermetabolic adenopathy or lesions identified.  She had genetic testing due to a personal history of breast and uterine cancer as well as a family history of cancer. The results came back negative. She began adjuvant TCH with Onpro every 3 weeks for 6 cycles on 07/01/2017 followed by Herceptin every 3 weeks for 6 months. The regiment was selected to cover for endometrial cancer as well.  The patient has undergone adjuvant chemotherapy for endometrial and breast cancers consisting of TCH every 3 weeks x 6 cycles with Onpro on 07/01/17 under the supervision of [name]. She was switched from Taxol to abraxane at 4th cycle due to drug rash reaction. 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  She has kindly been referred to radiation oncology to discuss the role of radiation therapy in the management of her breast cancer. Completed chemotherapy 10-14-17 and continuing Herceptin.  On review of systems, the patient reports she is using her vaginal dilator twice a week without problems.  She notes some lumpectomy cavity thickening since surgery. She has fair energy and walks 2 miles when weather is decent.. Eating wlel.           ALLERGIES:  is allergic to ciprofloxacin; crestor [rosuvastatin calcium]; and penicillins.  Meds: Current Outpatient Medications  Medication Sig Dispense Refill  . acetaminophen (TYLENOL) 325 MG tablet Take 650 mg by mouth every 6 (six) hours as needed for mild pain or moderate pain.    Marland Kitchen  Ascorbic Acid (VITAMIN C) 1000 MG tablet Take 1,000 mg by mouth daily.    . calcium carbonate (TUMS - DOSED IN MG ELEMENTAL CALCIUM) 500 MG chewable tablet Chew 2 tablets by mouth 2 (two) times daily as needed for indigestion or heartburn.    .  lidocaine-prilocaine (EMLA) cream Apply to affected area once 30 g 3  . meclizine (ANTIVERT) 25 MG tablet Take 1-2 tablets (25-50 mg total) by mouth 3 (three) times daily as needed for dizziness. (Patient taking differently: Take 25-50 mg by mouth 3 (three) times daily as needed for dizziness (depends on dizziness if takes 1-2 tablets). ) 30 tablet 0  . Multiple Vitamin (MULTIVITAMIN) capsule Take 1 capsule by mouth daily.      Marland Kitchen olmesartan-hydrochlorothiazide (BENICAR HCT) 20-12.5 MG tablet Take 1 tablet by mouth daily. 90 tablet 2  . sodium chloride (MURO 128) 5 % ophthalmic ointment Place 1 application into both eyes 2 (two) times daily.     Marland Kitchen dexamethasone (DECADRON) 4 MG tablet Take 1 tablet (4 mg total) by mouth daily. Take BID X 2 days, then daily until completed. (Patient not taking: Reported on 09/23/2017) 20 tablet 0  . ondansetron (ZOFRAN) 8 MG tablet Take 1 tablet (8 mg total) by mouth 2 (two) times daily as needed for refractory nausea / vomiting. Start on day 3 after chemo. (Patient not taking: Reported on 11/03/2017) 30 tablet 1  . oxyCODONE-acetaminophen (PERCOCET/ROXICET) 5-325 MG tablet Take 1-2 tablets by mouth every 4 (four) hours as needed (moderate to severe pain). (Patient not taking: Reported on 11/03/2017) 30 tablet 0  . Pitavastatin Calcium (LIVALO) 2 MG TABS Take 3 mg by mouth every evening. Takes 1.5 tablet    . prochlorperazine (COMPAZINE) 10 MG tablet Take 1 tablet (10 mg total) by mouth every 6 (six) hours as needed (Nausea or vomiting). (Patient not taking: Reported on 11/03/2017) 30 tablet 1  . traMADol (ULTRAM) 50 MG tablet Take 1 tablet (50 mg total) every 6 (six) hours as needed by mouth. (Patient not taking: Reported on 11/03/2017) 30 tablet 0   No current facility-administered medications for this encounter.    Review of Systems: as above  Physical Findings:  height is 5' 3" (1.6 m) and weight is 190 lb (86.2 kg). Her temperature is 98.4 F (36.9 C). Her blood  pressure is 127/73 and her pulse is 86. Her oxygen saturation is 98%.     General: Alert and oriented, in no acute distress. Psychiatric: Judgment and insight are intact. Affect is appropriate. Breast exam reveals thickening in UIQ of right breast consistent with prior lumpectomy.    Lab Findings: Lab Results  Component Value Date   WBC 3.7 (L) 10/14/2017   HGB 10.7 (L) 10/14/2017   HCT 31.0 (L) 10/14/2017   MCV 108.8 (H) 10/14/2017   PLT 133 (L) 10/14/2017      Radiographic Findings: No results found.  Impression/Plan: Right Breast Cancer s/p Lumpectomy/chemotherapy- ready for RT.  Endometrial cancer also post operative / status post chemo and vag cuff RT; healing well.  I recommend 4 weeks of radiation to the patient's right breast in order to reduce her risk of locoregional recurrence by 2/3s. We reviewed the anticipated acute and late sequelae associated with radiation in this setting.  The patient was encouraged to ask questions that I answered to the best of my ability.  I filled out a patient counseling form during our discussion including treatment diagrams.  We retained a copy for our records.  The patient would like to proceed with radiation and will be scheduled for CT simulation tomorrow.  In a face to face visit lasting 20 minutes, greater than 50% of the time was spent discussing logistics of treatment, counseling, and coordinating the patient's care.   _____________________________________   [name], MD  This document serves as a record of services personally performed by [name], MD. It was created on his behalf by Linward Natal, a trained medical scribe. The creation of this record is based on the scribe's personal observations and the provider's statements to them. This document has been checked and approved by the attending provider.

## 2017-11-03 NOTE — Progress Notes (Signed)
[name] presents for follow up. She is here to discuss radiation to her Right Breast. She completed Brachytherapy for her endometrial cancer on 09/17/17. She completed cycle 6 of [place] on 10/14/17, and will continue Herceptin for 6 more treatments per [name]. She is feeling well and eating well. She is ready to start breast radiation.   BP 127/73   Pulse 86   Temp 98.4 F (36.9 C)   Ht 5\' 3"  (1.6 m)   Wt 190 lb (86.2 kg)   SpO2 98% Comment: room air  BMI 33.66 kg/m    Wt Readings from Last 3 Encounters:  11/03/17 190 lb (86.2 kg)  10/14/17 188 lb 9.6 oz (85.5 kg)  09/23/17 188 lb 9.6 oz (85.5 kg)

## 2017-11-03 NOTE — Progress Notes (Signed)
On 11-03-17 came over the fax machine from [place]

## 2017-11-03 NOTE — Progress Notes (Signed)
  Radiation Oncology         [phone] ________________________________  Name: Dawn [name] MRN: [id number]  Date: 11/03/2017  DOB: August 04, 1951  SIMULATION AND TREATMENT PLANNING NOTE    Outpatient  DIAGNOSIS:     ICD-10-CM   1. Malignant neoplasm of upper-inner quadrant of right breast in female, estrogen receptor positive ([place]) C50.211    Z17.0     NARRATIVE:  The patient was brought to the [facility].  Identity was confirmed.  All relevant records and images related to the planned course of therapy were reviewed.  The patient freely provided informed written consent to proceed with treatment after reviewing the details related to the planned course of therapy. The consent form was witnessed and verified by the simulation staff.    Then, the patient was set-up in a stable reproducible supine position for radiation therapy with her ipsilateral arm over her head, and her upper body secured in a custom-made Vac-lok device.  CT images were obtained.  Surface markings were placed.  The CT images were loaded into the planning software.    TREATMENT PLANNING NOTE: Treatment planning then occurred.  The radiation prescription was entered and confirmed.     A total of 3 medically necessary complex treatment devices were fabricated and supervised by me: 2 fields with MLCs for custom blocks to protect heart, and lungs;  and, a Vac-lok. MORE COMPLEX DEVICES MAY BE MADE IN DOSIMETRY FOR FIELD IN FIELD BEAMS FOR DOSE HOMOGENEITY.  I have requested : 3D Simulation which is medically necessary to give adequate dose to at risk tissues while sparing lungs and heart.  I have requested a DVH of the following structures: lungs, heart, right lumpectomy cavity.    The patient will receive 40.05 Gy in 15 fractions to the right breast with 2 tangential fields.  This will be followed by a boost.  Optical Surface Tracking Plan:  Since intensity modulated radiotherapy (IMRT) and 3D conformal  radiation treatment methods are predicated on accurate and precise positioning for treatment, intrafraction motion monitoring is medically necessary to ensure accurate and safe treatment delivery. The ability to quantify intrafraction motion without excessive ionizing radiation dose can only be performed with optical surface tracking. Accordingly, surface imaging offers the opportunity to obtain 3D measurements of patient position throughout IMRT and 3D treatments without excessive radiation exposure. I am ordering optical surface tracking for this patient's upcoming course of radiotherapy.  ________________________________   Reference:  Ursula Alert, J, et al. Surface imaging-based analysis of intrafraction motion for breast radiotherapy patients.Journal of [place], n. 6, nov. 2014. ISSN [id number].  Available at: <http://www.jacmp.org/index.php/jacmp/article/view/4957>.    -----------------------------------  [name], MD

## 2017-11-04 ENCOUNTER — Inpatient Hospital Stay (HOSPITAL_BASED_OUTPATIENT_CLINIC_OR_DEPARTMENT_OTHER): Payer: Medicare Other | Admitting: Nurse Practitioner

## 2017-11-04 ENCOUNTER — Inpatient Hospital Stay: Payer: Medicare Other

## 2017-11-04 ENCOUNTER — Inpatient Hospital Stay: Payer: Medicare Other | Attending: Hematology

## 2017-11-04 ENCOUNTER — Encounter: Payer: Self-pay | Admitting: Nurse Practitioner

## 2017-11-04 VITALS — BP 135/74 | HR 83 | Temp 98.1°F | Resp 18 | Ht 63.0 in | Wt 190.6 lb

## 2017-11-04 DIAGNOSIS — C541 Malignant neoplasm of endometrium: Secondary | ICD-10-CM | POA: Insufficient documentation

## 2017-11-04 DIAGNOSIS — Z170 Estrogen receptor positive status [ER+]: Secondary | ICD-10-CM | POA: Diagnosis not present

## 2017-11-04 DIAGNOSIS — Z510 Encounter for antineoplastic radiation therapy: Secondary | ICD-10-CM | POA: Diagnosis not present

## 2017-11-04 DIAGNOSIS — Z95828 Presence of other vascular implants and grafts: Secondary | ICD-10-CM

## 2017-11-04 DIAGNOSIS — I10 Essential (primary) hypertension: Secondary | ICD-10-CM | POA: Diagnosis not present

## 2017-11-04 DIAGNOSIS — C50211 Malignant neoplasm of upper-inner quadrant of right female breast: Secondary | ICD-10-CM | POA: Diagnosis not present

## 2017-11-04 DIAGNOSIS — Z5112 Encounter for antineoplastic immunotherapy: Secondary | ICD-10-CM | POA: Diagnosis not present

## 2017-11-04 DIAGNOSIS — R7401 Elevation of levels of liver transaminase levels: Secondary | ICD-10-CM

## 2017-11-04 DIAGNOSIS — Z9889 Other specified postprocedural states: Secondary | ICD-10-CM | POA: Diagnosis not present

## 2017-11-04 DIAGNOSIS — R740 Nonspecific elevation of levels of transaminase and lactic acid dehydrogenase [LDH]: Secondary | ICD-10-CM

## 2017-11-04 DIAGNOSIS — Z90710 Acquired absence of both cervix and uterus: Secondary | ICD-10-CM | POA: Diagnosis not present

## 2017-11-04 LAB — CBC WITH DIFFERENTIAL/PLATELET
Basophils Absolute: 0 10*3/uL (ref 0.0–0.1)
Basophils Relative: 0 %
Eosinophils Absolute: 0 10*3/uL (ref 0.0–0.5)
Eosinophils Relative: 1 %
HCT: 30.5 % — ABNORMAL LOW (ref 34.8–46.6)
HEMOGLOBIN: 10.4 g/dL — AB (ref 11.6–15.9)
LYMPHS ABS: 1.5 10*3/uL (ref 0.9–3.3)
Lymphocytes Relative: 37 %
MCH: 37.5 pg — AB (ref 25.1–34.0)
MCHC: 34.1 g/dL (ref 31.5–36.0)
MCV: 110.1 fL — ABNORMAL HIGH (ref 79.5–101.0)
MONO ABS: 1 10*3/uL — AB (ref 0.1–0.9)
MONOS PCT: 25 %
Neutro Abs: 1.5 10*3/uL (ref 1.5–6.5)
Neutrophils Relative %: 37 %
Platelets: 120 10*3/uL — ABNORMAL LOW (ref 145–400)
RBC: 2.77 MIL/uL — ABNORMAL LOW (ref 3.70–5.45)
RDW: 15.4 % — AB (ref 11.2–14.5)
WBC: 4.1 10*3/uL (ref 3.9–10.3)

## 2017-11-04 LAB — COMPREHENSIVE METABOLIC PANEL
ALK PHOS: 56 U/L (ref 40–150)
ALT: 168 U/L — ABNORMAL HIGH (ref 0–55)
ANION GAP: 9 (ref 3–11)
AST: 85 U/L — ABNORMAL HIGH (ref 5–34)
Albumin: 3.7 g/dL (ref 3.5–5.0)
BILIRUBIN TOTAL: 0.4 mg/dL (ref 0.2–1.2)
BUN: 9 mg/dL (ref 7–26)
CO2: 27 mmol/L (ref 22–29)
Calcium: 9.4 mg/dL (ref 8.4–10.4)
Chloride: 101 mmol/L (ref 98–109)
Creatinine, Ser: 0.76 mg/dL (ref 0.60–1.10)
GFR calc non Af Amer: >60 mL/min (ref >60)
GLUCOSE: 126 mg/dL (ref 70–140)
POTASSIUM: 3.4 mmol/L — AB (ref 3.5–5.1)
SODIUM: 137 mmol/L (ref 136–145)
TOTAL PROTEIN: 6.9 g/dL (ref 6.4–8.3)

## 2017-11-04 MED ORDER — SODIUM CHLORIDE 0.9 % IV SOLN
Freq: Once | INTRAVENOUS | Status: AC
  Administered 2017-11-04: 13:00:00 via INTRAVENOUS

## 2017-11-04 MED ORDER — HEPARIN SOD (PORK) LOCK FLUSH 100 UNIT/ML IV SOLN
500.0000 [IU] | Freq: Once | INTRAVENOUS | Status: AC | PRN
  Administered 2017-11-04: 500 [IU]
  Filled 2017-11-04: qty 5

## 2017-11-04 MED ORDER — DIPHENHYDRAMINE HCL 25 MG PO CAPS
50.0000 mg | ORAL_CAPSULE | Freq: Once | ORAL | Status: AC
  Administered 2017-11-04: 50 mg via ORAL

## 2017-11-04 MED ORDER — SODIUM CHLORIDE 0.9% FLUSH
10.0000 mL | INTRAVENOUS | Status: DC | PRN
  Administered 2017-11-04: 10 mL via INTRAVENOUS
  Filled 2017-11-04: qty 10

## 2017-11-04 MED ORDER — SODIUM CHLORIDE 0.9% FLUSH
10.0000 mL | INTRAVENOUS | Status: DC | PRN
  Administered 2017-11-04: 10 mL
  Filled 2017-11-04: qty 10

## 2017-11-04 MED ORDER — DIPHENHYDRAMINE HCL 25 MG PO CAPS
ORAL_CAPSULE | ORAL | Status: AC
  Filled 2017-11-04: qty 2

## 2017-11-04 MED ORDER — ACETAMINOPHEN 325 MG PO TABS
ORAL_TABLET | ORAL | Status: AC
  Filled 2017-11-04: qty 2

## 2017-11-04 MED ORDER — SODIUM CHLORIDE 0.9 % IV SOLN
6.0000 mg/kg | Freq: Once | INTRAVENOUS | Status: AC
  Administered 2017-11-04: 525 mg via INTRAVENOUS
  Filled 2017-11-04: qty 25

## 2017-11-04 MED ORDER — ACETAMINOPHEN 325 MG PO TABS
650.0000 mg | ORAL_TABLET | Freq: Once | ORAL | Status: AC
  Administered 2017-11-04: 650 mg via ORAL

## 2017-11-04 NOTE — Progress Notes (Signed)
Dawn [name]  [phone] [phone]  Clinic Follow up Note   Patient Care Team: [name], MD as PCP - Haskell Riling, MD as Consulting Physician (General Surgery) Truitt Merle, MD as Consulting Physician (Hematology) [name], MD as Attending Physician (Radiation Oncology) 11/04/2017  CHIEF COMPLAINT: F/u right breast cancer and endometrial cancer   SUMMARY OF ONCOLOGIC HISTORY:   Malignant neoplasm of upper-inner quadrant of right breast in female, estrogen receptor positive ([place])   04/27/2017 Initial Biopsy    Diagnosis Breast, right, needle core biopsy INVASIVE DUCTAL CARCINOMA, GRADE 2 Microscopic Comment The neoplasm has intracellular mucin and signet ring cell features. Immunostains shows these cells are positive for ER,GATA3, ck7 and GCDFP, negative for ck20, cdx2, TTF-1 and pax8, The immunostaining pattern supports the neoplasm is breast primary.      04/27/2017 Receptors her2    Estrogen Receptor: 70%, POSITIVE, MODERATE STAINING INTENSITY Progesterone Receptor: 0%, NEGATIVE Proliferation Marker Ki67: 30% HER2 - **POSITIVE** RATIO OF HER2/CEP17 SIGNALS 2.91 AVERAGE HER2 COPY NUMBER PER CELL 7.28      04/27/2017 Initial Diagnosis    Malignant neoplasm of upper-inner quadrant of right breast in female, estrogen receptor positive ([place])       Mammogram         05/07/2017 Imaging    CT Chest W Contrast 05/07/17 IMPRESSION: Tiny well-defined fatty lesion on the pleura at the right lung base. The thinner slice collimation used for today's chest CT eliminates volume-averaging seen in the lesion on the prior exam and confirms that this is a diffusely fatty nodule. This is a benign finding and likely represents a tiny lipoma. No defect in the hemidiaphragm evident to suggest tiny diaphragmatic hernia. Pulmonary hamartoma a consideration although the lack of soft tissue components makes this less likely.        05/15/2017 Genetic Testing    Patient had genetic testing due to a personal history of breast cancer and uterine cancer as well as a family history of cancer. The Multi-Cancer panel was ordered. The Multi-Cancer Panel offered by [facility] includes sequencing and/or deletion duplication testing of the following 83 genes: ALK, APC, ATM, AXIN2,BAP1,  BARD1, BLM, BMPR1A, BRCA1, BRCA2, BRIP1, CASR, CDC73, CDH1, CDK4, CDKN1B, CDKN1C, CDKN2A (p14ARF), CDKN2A (p16INK4a), CEBPA, CHEK2, CTNNA1, DICER1, DIS3L2, EGFR (c.2369C>T, p.Thr790Met variant only), EPCAM (Deletion/duplication testing only), FH, FLCN, GATA2, GPC3, GREM1 (Promoter region deletion/duplication testing only), HOXB13 (c.251G>A, p.Gly84Glu), HRAS, KIT, MAX, MEN1, MET, MITF (c.952G>A, p.Glu318Lys variant only), MLH1, MSH2, MSH3, MSH6, MUTYH, NBN, NF1, NF2, NTHL1, PALB2, PDGFRA, PHOX2B, PMS2, POLD1, POLE, POT1, PRKAR1A, PTCH1, PTEN, RAD50, RAD51C, RAD51D, RB1, RECQL4, RET, RUNX1, SDHAF2, SDHA (sequence changes only), SDHB, SDHC, SDHD, SMAD4, SMARCA4, SMARCB1, SMARCE1, STK11, SUFU, TERC, TERT, TMEM127, TP53, TSC1, TSC2, VHL, WRN and WT1.   Results: No pathogenic mutations were identified. A VUS in the GATA2 gene c.1348G>A (p.Gly450Arg) was identified.  The date of this test report is 05/25/2017.        05/25/2017 Surgery    RIGHT BREAST LUMPECTOMY WITH RADIOACTIVE SEED AND  RIGHT AXILLARY SENTINEL LYMPH NODE BIOPSY and Pot placement by Dr. Excell Seltzer 05/25/17      05/25/2017 Pathology Results    Diagnosis 05/25/17 1. Breast, lumpectomy, right - INVASIVE DUCTAL CARCINOMA, GRADE II/III, SPANNING 2.2 CM. - DUCTAL CARCINOMA IN SITU, HIGH GRADE. - THE SURGICAL RESECTION MARGINS ARE NEGATIVE FOR CARCINOMA. - SEE ONCOLOGY TABLE BELOW. 2. Breast, excision, right additional superior margin - BENIGN FIBROADIPOSE TISSUE. - BENIGN SKELETAL MUSCLE. - SEE  COMMENT. 3. Breast, excision, right additional lateral margin - BENIGN BREAST PARENCHYMA. - THERE IS  NO EVIDENCE OF MALIGNANCY. - SEE COMMENT. 4. Breast, excision, chest wall margin - BENIGN FIBROADIPOSE TISSUE. - THERE IS NO EVIDENCE OF MALIGNANCY. - SEE COMMENT. 5. Lymph node, sentinel, biopsy, right axillary - THERE IS NO EVIDENCE OF CARCINOMA IN 1 OF 1 LYMPH NODE (0/1). 6. Lymph node, sentinel, biopsy, right axillary - THERE IS NO EVIDENCE OF CARCINOMA IN 1 OF 1 LYMPH NODE (0/1). 7. Lymph node, sentinel, biopsy, right axillary - THERE IS NO EVIDENCE OF CARCINOMA IN 1 OF 1 LYMPH NODE (0/1).      06/26/2017 PET scan    PET  IMPRESSION: 1. Postoperative findings both in the right breast and in the anatomic pelvis, with associated low-grade activity considered to be postoperative in nature. No hypermetabolic adenopathy or hypermetabolic lesions are identified to suggest active metastatic disease/malignancy. 2. Other imaging findings of potential clinical significance: Aortic Atherosclerosis (ICD10-I70.0). Sigmoid colon diverticulosis. Biapical pleuroparenchymal scarring in the lungs. Small amount of free pelvic fluid in the cul-de-sac, likely postoperative.       07/01/2017 - 10/14/2017 Chemotherapy    Adjuvant TCH with Onpro every 3 weeks for 6 cycles starting on 07/01/17, Changed Taxol to abraxane with cycle 4 due to drug rash reaction, followed by Herceptin every 3 weeks for 6 months        Endometrial adenocarcinoma ([place])   05/07/2017 Initial Diagnosis    Endometrial adenocarcinoma ([place])      06/09/2017 Surgery    XI ROBOTIC ASSISTED TOTAL LAPOROSCOPIC HYSTERECTOMY WITH BILATERAL SALPINGO OOPHORECTOMY and SENTINEL NODE BIOPSY by Dr. Denman George 06/09/17      06/09/2017 Pathology Results    Diagnosis 06/09/17 1. Lymph node, sentinel, biopsy, right external iliac - METASTATIC ADENOCARCINOMA IN ONE LYMPH NODE (1/1). 2. Lymph node, sentinel, biopsy, left obturator - ONE BENIGN LYMPH NODE (0/1). 3. Lymph node, sentinel, biopsy, left external iliac - METASTATIC ADENOCARCINOMA  IN ONE LYMPH NODE (1/1). 4. Uterus +/- tubes/ovaries, neoplastic ENDOMETRIUM: - ENDOMETRIAL ADENOCARCINOMA, 3.2 CM. - CARCINOMA INVADES INNER HALF OF MYOMETRIUM. 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 - CERVIX, BILATERAL FALLOPIAN TUBES AND BILATERAL OVARIES FREE OF TUMOR      08/18/2017 - 09/17/2017 Radiation Therapy    vaginal brachytherapy per [name] on starting 08/18/17     CURRENT THERAPY:   -Adjuvant TCH with Onpro every 3 weeks for 6 cycles starting 07/01/17. Changed Taxol to abraxane with cycle 4 due to drug rash reaction, Completed on 10/14/17. Followed by Herceptin every 3 weeks for total of 12 months.  -vaginal brachytherapy with [name] starting 08/18/17, completed on 09/17/2017. PENDING breast radiation to begin 11/11/17   INTERVAL HISTORY: Dawn [name] returns for follow-up as scheduled.  She completed 6 cycles chemotherapy with Abraxane and carbo on 10/14/2017.  She is here today to begin first cycle of maintenance Herceptin.  She feels well overall, she began walking again for exercise, completed 2 miles yesterday.  Eating and drinking well with good appetite.  Denies fever or chills, no cough, chest pain, dyspnea or GI symptoms.  She noticed some vaginal bleeding after using dilator yesterday; she was told to expect some bleeding per radiation.  3 days ago she noticed itching on her right arm near elbow, using oral Benadryl and topical hydrocortisone which helps.  Continues to note intermittent mild numbness and tingling to fingers and toes.  Stable overall.  Does not affect grip, balance, gait or function.  REVIEW OF  SYSTEMS:   Constitutional: Denies fatigue, fevers, chills or abnormal weight loss (+) good appetite Eyes: Denies blurriness of vision Ears, nose, mouth, throat, and face: Denies mucositis or sore throat Respiratory: Denies cough, dyspnea or wheezes Cardiovascular: Denies palpitation, chest discomfort or lower extremity swelling Gastrointestinal:  Denies nausea,  vomiting, constipation, diarrhea, heartburn or change in bowel habits GU/GYN: (+) Mild vaginal bleeding after dilator use, as instructed per rad onc Skin: (+) itching right forearm near elbow Lymphatics: Denies new lymphadenopathy or easy bruising Neurological:Denies new weaknesses (+) vertigo (+) mild intermittent numbness and tingling to fingers and toes, stable Behavioral/Psych: Mood is stable, no new changes  All other systems were reviewed with the patient and are negative.  MEDICAL HISTORY:  Past Medical History:  Diagnosis Date  . Anemia    as a child.  . Arthritis   . Bilateral cataracts   . Bilateral leg cramps   . Cancer ([place])    skin  . Dyspnea   . Family history of bladder cancer   . Family history of colon cancer in father   . Fuchs' corneal dystrophy   . GERD (gastroesophageal reflux disease)   . Hearing loss    Right side 30%  . History of hiatal hernia    small size  . History of radiation therapy 08/21/17, 08/28/17,09/01/17, 09/11/17, 09/17/17   Vaginal cuff brachytherapy.   . Hyperlipidemia   . Hypertension   . IBS (irritable bowel syndrome)    hx of  . Malignant neoplasm of upper-inner quadrant of right female breast ([place])   . NAFL (nonalcoholic fatty liver)   . Obesity   . PONV (postoperative nausea and vomiting)   . 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 Uterine cancer ([place])    endometrial cancer  . Varices, gastric   . Vertigo     SURGICAL HISTORY: Past Surgical History:  Procedure Laterality Date  . BREAST BIOPSY Right 04/27/2017  . BREAST LUMPECTOMY WITH RADIOACTIVE SEED AND SENTINEL LYMPH NODE BIOPSY Right 05/25/2017   Procedure: RIGHT BREAST LUMPECTOMY WITH RADIOACTIVE SEED AND  RIGHT AXILLARY SENTINEL LYMPH NODE BIOPSY;  Surgeon: Excell Seltzer, MD;  Location: [facility];  Service: General;  Laterality: Right;  . COLONOSCOPY    . HYSTEROSCOPY W/D&C N/A 04/29/2017   Procedure: DILATATION AND  CURETTAGE /HYSTEROSCOPY;  Surgeon: Linda Hedges, DO;  Location: [facility];  Service: Gynecology;  Laterality: N/A;  . PORTACATH PLACEMENT Left 05/25/2017   Procedure: INSERTION PORT-A-CATH;  Surgeon: Excell Seltzer, MD;  Location: [facility];  Service: General;  Laterality: Left;  . ROBOTIC ASSISTED TOTAL HYSTERECTOMY WITH BILATERAL SALPINGO OOPHERECTOMY N/A 06/09/2017   Procedure: XI ROBOTIC ASSISTED TOTAL LAPOROSCOPIC HYSTERECTOMY WITH BILATERAL SALPINGO OOPHORECTOMY;  Surgeon: Everitt Amber, MD;  Location: [facility];  Service: Gynecology;  Laterality: N/A;  . SENTINEL NODE BIOPSY N/A 06/09/2017   Procedure: SENTINEL NODE BIOPSY;  Surgeon: Everitt Amber, MD;  Location: [facility];  Service: Gynecology;  Laterality: N/A;    I have reviewed the social history and family history with the patient and they are unchanged from previous note.  ALLERGIES:  is allergic to ciprofloxacin; crestor [rosuvastatin calcium]; and penicillins.  MEDICATIONS:  Current Outpatient Medications  Medication Sig Dispense Refill  . acetaminophen (TYLENOL) 325 MG tablet Take 650 mg by mouth every 6 (six) hours as needed for mild pain or moderate pain.    . Ascorbic Acid (VITAMIN C) 1000 MG tablet Take 1,000 mg by  mouth daily.    . calcium carbonate (TUMS - DOSED IN MG ELEMENTAL CALCIUM) 500 MG chewable tablet Chew 2 tablets by mouth 2 (two) times daily as needed for indigestion or heartburn.    . dexamethasone (DECADRON) 4 MG tablet Take 1 tablet (4 mg total) by mouth daily. Take BID X 2 days, then daily until completed. 20 tablet 0  . lidocaine-prilocaine (EMLA) cream Apply to affected area once 30 g 3  . meclizine (ANTIVERT) 25 MG tablet Take 1-2 tablets (25-50 mg total) by mouth 3 (three) times daily as needed for dizziness. (Patient taking differently: Take 25-50 mg by mouth 3 (three) times daily as needed for dizziness (depends on dizziness if takes 1-2 tablets). ) 30 tablet 0  . Multiple Vitamin (MULTIVITAMIN)  capsule Take 1 capsule by mouth daily.      Marland Kitchen olmesartan-hydrochlorothiazide (BENICAR HCT) 20-12.5 MG tablet Take 1 tablet by mouth daily. 90 tablet 2  . ondansetron (ZOFRAN) 8 MG tablet Take 1 tablet (8 mg total) by mouth 2 (two) times daily as needed for refractory nausea / vomiting. Start on day 3 after chemo. 30 tablet 1  . oxyCODONE-acetaminophen (PERCOCET/ROXICET) 5-325 MG tablet Take 1-2 tablets by mouth every 4 (four) hours as needed (moderate to severe pain). 30 tablet 0  . Pitavastatin Calcium (LIVALO) 2 MG TABS Take 3 mg by mouth every evening. Takes 1.5 tablet    . prochlorperazine (COMPAZINE) 10 MG tablet Take 1 tablet (10 mg total) by mouth every 6 (six) hours as needed (Nausea or vomiting). 30 tablet 1  . sodium chloride (MURO 128) 5 % ophthalmic ointment Place 1 application into both eyes 2 (two) times daily.     . traMADol (ULTRAM) 50 MG tablet Take 1 tablet (50 mg total) every 6 (six) hours as needed by mouth. 30 tablet 0   No current facility-administered medications for this visit.    Facility-Administered Medications Ordered in Other Visits  Medication Dose Route Frequency Provider Last Rate Last Dose  . heparin lock flush 100 unit/mL  500 Units Intracatheter Once PRN Truitt Merle, MD      . sodium chloride flush (NS) 0.9 % injection 10 mL  10 mL Intracatheter PRN Truitt Merle, MD      . trastuzumab (HERCEPTIN) 525 mg in sodium chloride 0.9 % 250 mL chemo infusion  6 mg/kg (Treatment Plan Recorded) Intravenous Once Truitt Merle, MD 550 mL/hr at 11/04/17 1349 525 mg at 11/04/17 1349    PHYSICAL EXAMINATION: ECOG PERFORMANCE STATUS: 1 - Symptomatic but completely ambulatory  Vitals:   11/04/17 1113  BP: 135/74  Pulse: 83  Resp: 18  Temp: 98.1 F (36.7 C)  SpO2: 96%   Filed Weights   11/04/17 1113  Weight: 190 lb 9.6 oz (86.5 kg)    GENERAL:alert, no distress and comfortable SKIN: skin color, texture, turgor are normal, no rashes or significant lesions EYES: normal,  Conjunctiva are pink and non-injected, sclera clear OROPHARYNX:no exudate, no erythema and lips, buccal mucosa, and tongue normal  NECK: supple, thyroid normal size, non-tender, without nodularity LYMPH:  no palpable cervical, supraclavicular, axillary, or inguinal lymphadenopathy LUNGS: clear to auscultation bilaterally with normal breathing effort HEART: regular rate & rhythm and no lower extremity edema (+) murmur ABDOMEN:abdomen soft, non-tender and normal bowel sounds. No hepatomegaly  Musculoskeletal:no cyanosis of digits and no clubbing  NEURO: alert & oriented x 3 with fluent speech, no focal motor/sensory deficits BREASTS: inspection shows them to by symmetrical without nipple discharge. Right breast  s/p lumpectomy; axillary and areolar incisions are well healed. No palpable mass in breasts or axilla that I could appreciate.  PAC without erythema   LABORATORY DATA:  I have reviewed the data as listed CBC Latest Ref Rng & Units 11/04/2017 10/14/2017 09/23/2017  WBC 3.9 - 10.3 K/uL 4.1 3.7(L) 3.6(L)  Hemoglobin 11.6 - 15.9 g/dL 10.4(L) 10.7(L) 10.9(L)  Hematocrit 34.8 - 46.6 % 30.5(L) 31.0(L) 31.7(L)  Platelets 145 - 400 K/uL 120(L) 133(L) 134(L)     CMP Latest Ref Rng & Units 11/04/2017 10/14/2017 09/23/2017  Glucose 70 - 140 mg/dL 126 110 120  BUN 7 - 26 mg/dL '9 11 9  '$ Creatinine 0.60 - 1.10 mg/dL 0.76 0.78 0.79  Sodium 136 - 145 mmol/L 137 137 138  Potassium 3.5 - 5.1 mmol/L 3.4(L) 3.4 3.4  Chloride 98 - 109 mmol/L 101 100 103  CO2 22 - 29 mmol/L '27 27 25  '$ Calcium 8.4 - 10.4 mg/dL 9.4 9.4 9.2  Total Protein 6.4 - 8.3 g/dL 6.9 7.0 7.0  Total Bilirubin 0.2 - 1.2 mg/dL 0.4 0.5 0.6  Alkaline Phos 40 - 150 U/L 56 60 51  AST 5 - 34 U/L 85(H) 72(H) 66(H)  ALT 0 - 55 U/L 168(H) 127(H) 128(H)   PATHOLOGY FINDINGS:   Diagnosis 06/09/17 1. Lymph node, sentinel, biopsy, right external iliac - METASTATIC ADENOCARCINOMA IN ONE LYMPH NODE (1/1). 2. Lymph node, sentinel, biopsy, left  obturator - ONE BENIGN LYMPH NODE (0/1). 3. Lymph node, sentinel, biopsy, left external iliac - METASTATIC ADENOCARCINOMA IN ONE LYMPH NODE (1/1). 4. Uterus +/- tubes/ovaries, neoplastic ENDOMETRIUM: - ENDOMETRIAL ADENOCARCINOMA, 3.2 CM. - CARCINOMA INVADES INNER HALF OF MYOMETRIUM. 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 - CERVIX, BILATERAL FALLOPIAN TUBES AND BILATERAL OVARIES FREE OF TUMOR. Microscopic Comment 4. ONCOLOGY TABLE-UTERUS, CARCINOMA OR CARCINOSARCOMA Specimen: Uterus with bilateral fallopian tubes and ovaries, right and left external iliac nodes and left obturator lymph node. Procedure: Hysterectomy with bilateral salpingo-oophorectomy and lymph node biopsies. Lymph node sampling performed: Yes. Specimen integrity: Intact. Maximum tumor size: 3.2 cm. Histologic type: Mixed, endometrioid, clear cell and serous. Grade: III. Myometrial invasion: 0.8 cm where myometrium is 2.2 cm in thickness. Cervical stromal involvement: No. Extent of involvement of other organs: N/A. Lymph - vascular invasion: Present. Peritoneal washings: N/A. Lymph nodes: Examined: 3 Sentinel 1 of 3 FINAL for ADRINE, HAYWORTH ([id number]) Microscopic Comment(continued) 0 Non-sentinel 3 Total Lymph nodes with metastasis: 2. Isolated tumor cells (< 0.2 mm): 0. Micrometastasis: (> 0.2 mm and < 2.0 mm): 0. Macrometastasis: (> 2.0 mm): 2. Extracapsular extension: No. TNM code: pT1a, pN1. FIGO Stage (based on pathologic findings, needs clinical correlation): IIIC1. Comments: The tumor is 3.2 cm in greatest dimension and invades the myometrium to a depth of 0.8 cm where the myometrium is 2.2 cm in thickness and there is lymphatic vascular involvement by tumor. 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 (JDP:ah 06/10/17)   Diagnosis 05/25/17 1. Breast, lumpectomy, right - INVASIVE DUCTAL CARCINOMA, GRADE II/III, SPANNING 2.2 CM. - DUCTAL CARCINOMA IN SITU, HIGH  GRADE. - THE SURGICAL RESECTION MARGINS ARE NEGATIVE FOR CARCINOMA. - SEE ONCOLOGY TABLE BELOW. 2. Breast, excision, right additional superior margin - BENIGN FIBROADIPOSE TISSUE. - BENIGN SKELETAL MUSCLE. - SEE COMMENT. 3. Breast, excision, right additional lateral margin - BENIGN BREAST PARENCHYMA. - THERE IS NO EVIDENCE OF MALIGNANCY. - SEE COMMENT. 4. Breast, excision, chest wall margin - BENIGN FIBROADIPOSE TISSUE. - THERE IS NO EVIDENCE OF MALIGNANCY. - SEE  COMMENT. 5. Lymph node, sentinel, biopsy, right axillary - THERE IS NO EVIDENCE OF CARCINOMA IN 1 OF 1 LYMPH NODE (0/1). 6. Lymph node, sentinel, biopsy, right axillary - THERE IS NO EVIDENCE OF CARCINOMA IN 1 OF 1 LYMPH NODE (0/1). 7. Lymph node, sentinel, biopsy, right axillary - THERE IS NO EVIDENCE OF CARCINOMA IN 1 OF 1 LYMPH NODE (0/1). Microscopic Comment 1. BREAST, INVASIVE TUMOR Procedure: Seed localized lumpectomy, additional superior, lateral, and chest wall margin resections and axillary lymph node resections. Laterality: Right. Tumor Size: 2.2 cm (gross measurement). Histologic Type: Ductal. Microscopic Comment(continued) Grade: II. Tubular Differentiation: 2. Nuclear Pleomorphism: 2. Mitotic Count: 2. Ductal Carcinoma in Situ (DCIS): Present, high grade. Extent of Tumor: Confined to breast parenchyma. Margins: Greater than or equal to 0.2 cm to all margins. Regional Lymph Nodes: Number of Lymph Nodes Examined: 3. Number of Sentinel Lymph Nodes Examined: 3. Lymph Nodes with Macrometastases: 0. Lymph Nodes with Micrometastases: 0. Lymph Nodes with Isolated Tumor Cells: 0. Breast Prognostic Profile: Case [id number]. Estrogen Receptor: 70%, moderate. Progesterone Receptor: 0%. Her2: Amplification was detected. The ratio was 2.91. Ki-67: 30%. Best tumor block for sendout testing: 1A-1C. Pathologic Stage Classification (pTNM, AJCC 8th Edition): Primary Tumor (pT): pT2. Regional Lymph Nodes (pN):  pN0. Distant Metastases (pM): pMX. 2. - 4. The surgical resection margin(s) of the specimen were inked and microscopically evaluated.  Diagnosis 04/29/2017 Endometrium, curettage - ADENOCARCINOMA. - SEE MICROSCOPIC DESCRIPTION. Microscopic Comment There are fragments of adenocarcinoma, some of which have cribriform architecture, and there is focal extracellular mucin. Immunohistochemistry shows the tumor is positive with estrogen receptor, progesterone receptor with patchy positivity with carcinoembryonic antigen and p16. The tumor is negative with CD10 and vimentin. The morphology and immunophenotype are not definitive as to endocervical or endometrial origin. There are rare fragments with endometrial type stroma, which may indicate an endometrial origin although this is not definitive as to origin.  Diagnosis 04/27/2017 Breast, right, needle core biopsy INVASIVE DUCTAL CARCINOMA, GRADE 2 Microscopic Comment The neoplasm has intracellular mucin and signet ring cell features. Immunostains shows these cells are positive for ER, GATA3, ck7 and GCDFP, negative for ck20, cdx2, TTF-1 and pax8, The immunostaining pattern supports the neoplasm is breast primary. The Breast prognostic profile has been ordered. Results: IMMUNOHISTOCHEMICAL AND MORPHOMETRIC ANALYSIS PERFORMED MANUALLY Estrogen Receptor: 70%, POSITIVE, MODERATE STAINING INTENSITY Progesterone Receptor: 0%, NEGATIVE Proliferation Marker Ki67: 30% Results: HER2 - **POSITIVE** RATIO OF HER2/CEP17 SIGNALS 2.91 AVERAGE HER2 COPY NUMBER PER CELL 7.28  GENETICS 05/07/17    RADIOGRAPHIC STUDIES: I have personally reviewed the radiological images as listed and agreed with the findings in the report. No results found.   ASSESSMENT & PLAN: 67 y.o. 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 Malignant neoplasm of upper inner quadrant of right breast ,  Invasive ductal carcinoma, pT2N0M0, G2,  ER+/PR-/HER2+ -Ms. Brumm appears stable.  She completed adjuvant chemo Abraxane/carbo on 10/14/2017.  She tolerated well overall.  Total signs and weight stable.  Labs reviewed, adequate for treatment.  She will proceed with first cycle maintenance Herceptin today, will continue every 3 weeks for a total of 1 year.  She will begin adjuvant breast radiation per [name] on 11/11/2017. Next routine monitoring echo on 11/30/2017.  She will return for next Herceptin in 3 weeks, follow-up in 6 weeks.  2. Endometrial adenocarcinoma with lymph node metastasis, pT1ApN1M0, FIGO stage IIIc -Status post total hysterectomy with BSO per  Dr. Denman George on 06/09/2017.  Completed Taxol/carbo (taxol changed to abraxane) every 3 weeks x6 cycles and adjuvant vaginal brachytherapy from 08/18/17-09/17/17.  She tolerated well overall.  He experiences mild vaginal bleeding when using dilator as instructed per rad onc.  3. Genetics  - negative results  4. HTN -BP well controlled lately, on Benicar  5. Vertigo -Has restarted meclizine  6. Insurance and emotional support -Now has Medicare  7. Elevated LFTs -Likely elevated due to fatty liver disease, have been stable overall on treatment and will continue to monitor  8. Drug rash -Thought to be attributed to Taxol, chemo changed to Abraxane.  She still had mild rash after chemo which resolved with steroids.  Last chemo on 10/14/2017.  She developed localized itchiness on her posterior right forearm, no obvious rash.  She takes oral Benadryl and applies topical steroid cream for symptoms.  9. Bone pain from Onpro -Resolved.  PLAN -Proceed with maintenance herceptin today, continue q3 weeks  -Return for herceptin in 3 and 6 weeks -F/u in 6 weeks  -Echo 11/30/17 -To begin adjuvant RT 11/11/17   All questions were answered. The patient knows to call the clinic with any problems, questions or concerns. No barriers to learning was  detected.     Alla Feeling, NP 11/04/17

## 2017-11-04 NOTE — Patient Instructions (Signed)
[facility] Discharge Instructions for Patients Receiving Chemotherapy  Today you received the following chemotherapy agents Herceptin  To help prevent nausea and vomiting after your treatment, we encourage you to take your nausea medication as directed   If you develop nausea and vomiting that is not controlled by your nausea medication, call the clinic.   BELOW ARE SYMPTOMS THAT SHOULD BE REPORTED IMMEDIATELY:  *FEVER GREATER THAN 100.5 F  *CHILLS WITH OR WITHOUT FEVER  NAUSEA AND VOMITING THAT IS NOT CONTROLLED WITH YOUR NAUSEA MEDICATION  *UNUSUAL SHORTNESS OF BREATH  *UNUSUAL BRUISING OR BLEEDING  TENDERNESS IN MOUTH AND THROAT WITH OR WITHOUT PRESENCE OF ULCERS  *URINARY PROBLEMS  *BOWEL PROBLEMS  UNUSUAL RASH Items with * indicate a potential emergency and should be followed up as soon as possible.  Feel free to call the clinic should you have any questions or concerns. The clinic phone number is [phone].  Please show the CHEMO ALERT CARD at check-in to the Emergency Department and triage nurse.   

## 2017-11-05 ENCOUNTER — Encounter: Payer: Self-pay | Admitting: *Deleted

## 2017-11-05 ENCOUNTER — Telehealth: Payer: Self-pay | Admitting: Hematology

## 2017-11-05 NOTE — Progress Notes (Signed)
On 11-05-17 fax attending provider statement  To [facility]

## 2017-11-05 NOTE — Telephone Encounter (Signed)
11/05/17 @ 8:34 am spoke with patient confirming Disability has been successfully faxed to [employer] @ [phone].  Patient requested a copy be mailed to her home address for personal records.

## 2017-11-06 ENCOUNTER — Ambulatory Visit: Payer: 59 | Admitting: Radiation Oncology

## 2017-11-06 ENCOUNTER — Ambulatory Visit: Payer: Self-pay | Admitting: Radiation Oncology

## 2017-11-10 ENCOUNTER — Ambulatory Visit
Admission: RE | Admit: 2017-11-10 | Discharge: 2017-11-10 | Disposition: A | Discharge status: Home / Self Care | Payer: Medicare Other | Source: Ambulatory Visit | Attending: Radiation Oncology | Admitting: Radiation Oncology

## 2017-11-10 DIAGNOSIS — Z90710 Acquired absence of both cervix and uterus: Secondary | ICD-10-CM | POA: Diagnosis not present

## 2017-11-10 DIAGNOSIS — Z510 Encounter for antineoplastic radiation therapy: Secondary | ICD-10-CM | POA: Diagnosis not present

## 2017-11-10 DIAGNOSIS — Z9889 Other specified postprocedural states: Secondary | ICD-10-CM | POA: Diagnosis not present

## 2017-11-10 DIAGNOSIS — C541 Malignant neoplasm of endometrium: Secondary | ICD-10-CM | POA: Diagnosis not present

## 2017-11-10 DIAGNOSIS — Z170 Estrogen receptor positive status [ER+]: Secondary | ICD-10-CM | POA: Diagnosis not present

## 2017-11-10 DIAGNOSIS — C50211 Malignant neoplasm of upper-inner quadrant of right female breast: Secondary | ICD-10-CM | POA: Diagnosis not present

## 2017-11-11 ENCOUNTER — Ambulatory Visit
Admission: RE | Admit: 2017-11-11 | Discharge: 2017-11-11 | Disposition: A | Discharge status: Home / Self Care | Payer: Medicare Other | Source: Ambulatory Visit | Attending: Radiation Oncology | Admitting: Radiation Oncology

## 2017-11-11 DIAGNOSIS — Z510 Encounter for antineoplastic radiation therapy: Secondary | ICD-10-CM | POA: Diagnosis not present

## 2017-11-11 DIAGNOSIS — Z90710 Acquired absence of both cervix and uterus: Secondary | ICD-10-CM | POA: Diagnosis not present

## 2017-11-11 DIAGNOSIS — Z9889 Other specified postprocedural states: Secondary | ICD-10-CM | POA: Diagnosis not present

## 2017-11-11 DIAGNOSIS — Z170 Estrogen receptor positive status [ER+]: Secondary | ICD-10-CM | POA: Diagnosis not present

## 2017-11-11 DIAGNOSIS — H524 Presbyopia: Secondary | ICD-10-CM | POA: Diagnosis not present

## 2017-11-11 DIAGNOSIS — C50211 Malignant neoplasm of upper-inner quadrant of right female breast: Secondary | ICD-10-CM | POA: Diagnosis not present

## 2017-11-11 DIAGNOSIS — H1851 Endothelial corneal dystrophy: Secondary | ICD-10-CM | POA: Diagnosis not present

## 2017-11-11 DIAGNOSIS — H43813 Vitreous degeneration, bilateral: Secondary | ICD-10-CM | POA: Diagnosis not present

## 2017-11-11 DIAGNOSIS — H25813 Combined forms of age-related cataract, bilateral: Secondary | ICD-10-CM | POA: Diagnosis not present

## 2017-11-11 DIAGNOSIS — C541 Malignant neoplasm of endometrium: Secondary | ICD-10-CM | POA: Diagnosis not present

## 2017-11-12 ENCOUNTER — Ambulatory Visit
Admission: RE | Admit: 2017-11-12 | Discharge: 2017-11-12 | Disposition: A | Discharge status: Home / Self Care | Payer: Medicare Other | Source: Ambulatory Visit | Attending: Radiation Oncology | Admitting: Radiation Oncology

## 2017-11-12 DIAGNOSIS — Z170 Estrogen receptor positive status [ER+]: Secondary | ICD-10-CM | POA: Diagnosis not present

## 2017-11-12 DIAGNOSIS — C50211 Malignant neoplasm of upper-inner quadrant of right female breast: Secondary | ICD-10-CM | POA: Diagnosis not present

## 2017-11-12 DIAGNOSIS — Z90710 Acquired absence of both cervix and uterus: Secondary | ICD-10-CM | POA: Diagnosis not present

## 2017-11-12 DIAGNOSIS — Z9889 Other specified postprocedural states: Secondary | ICD-10-CM | POA: Diagnosis not present

## 2017-11-12 DIAGNOSIS — Z510 Encounter for antineoplastic radiation therapy: Secondary | ICD-10-CM | POA: Diagnosis not present

## 2017-11-12 DIAGNOSIS — C541 Malignant neoplasm of endometrium: Secondary | ICD-10-CM | POA: Diagnosis not present

## 2017-11-12 MED ORDER — RADIAPLEXRX EX GEL
Freq: Once | CUTANEOUS | Status: AC
  Administered 2017-11-12: 13:00:00 via TOPICAL

## 2017-11-12 NOTE — Progress Notes (Signed)

## 2017-11-13 ENCOUNTER — Ambulatory Visit
Admission: RE | Admit: 2017-11-13 | Discharge: 2017-11-13 | Disposition: A | Discharge status: Home / Self Care | Payer: Medicare Other | Source: Ambulatory Visit | Attending: Radiation Oncology | Admitting: Radiation Oncology

## 2017-11-13 DIAGNOSIS — C50211 Malignant neoplasm of upper-inner quadrant of right female breast: Secondary | ICD-10-CM | POA: Insufficient documentation

## 2017-11-13 DIAGNOSIS — Z170 Estrogen receptor positive status [ER+]: Secondary | ICD-10-CM | POA: Diagnosis not present

## 2017-11-13 DIAGNOSIS — C541 Malignant neoplasm of endometrium: Secondary | ICD-10-CM | POA: Diagnosis not present

## 2017-11-13 DIAGNOSIS — Z510 Encounter for antineoplastic radiation therapy: Secondary | ICD-10-CM | POA: Diagnosis not present

## 2017-11-16 ENCOUNTER — Ambulatory Visit
Admission: RE | Admit: 2017-11-16 | Discharge: 2017-11-16 | Disposition: A | Discharge status: Home / Self Care | Payer: Medicare Other | Source: Ambulatory Visit | Attending: Radiation Oncology | Admitting: Radiation Oncology

## 2017-11-16 DIAGNOSIS — Z170 Estrogen receptor positive status [ER+]: Secondary | ICD-10-CM | POA: Diagnosis not present

## 2017-11-16 DIAGNOSIS — C50211 Malignant neoplasm of upper-inner quadrant of right female breast: Secondary | ICD-10-CM | POA: Diagnosis not present

## 2017-11-16 DIAGNOSIS — C541 Malignant neoplasm of endometrium: Secondary | ICD-10-CM | POA: Diagnosis not present

## 2017-11-16 DIAGNOSIS — Z510 Encounter for antineoplastic radiation therapy: Secondary | ICD-10-CM | POA: Diagnosis not present

## 2017-11-17 ENCOUNTER — Ambulatory Visit
Admission: RE | Admit: 2017-11-17 | Discharge: 2017-11-17 | Disposition: A | Discharge status: Home / Self Care | Payer: Medicare Other | Source: Ambulatory Visit | Attending: Radiation Oncology | Admitting: Radiation Oncology

## 2017-11-17 DIAGNOSIS — Z170 Estrogen receptor positive status [ER+]: Secondary | ICD-10-CM | POA: Diagnosis not present

## 2017-11-17 DIAGNOSIS — C50211 Malignant neoplasm of upper-inner quadrant of right female breast: Secondary | ICD-10-CM | POA: Diagnosis not present

## 2017-11-17 DIAGNOSIS — C541 Malignant neoplasm of endometrium: Secondary | ICD-10-CM | POA: Diagnosis not present

## 2017-11-17 DIAGNOSIS — Z510 Encounter for antineoplastic radiation therapy: Secondary | ICD-10-CM | POA: Diagnosis not present

## 2017-11-18 ENCOUNTER — Ambulatory Visit
Admission: RE | Admit: 2017-11-18 | Discharge: 2017-11-18 | Disposition: A | Discharge status: Home / Self Care | Payer: Medicare Other | Source: Ambulatory Visit | Attending: Radiation Oncology | Admitting: Radiation Oncology

## 2017-11-18 DIAGNOSIS — Z170 Estrogen receptor positive status [ER+]: Secondary | ICD-10-CM | POA: Diagnosis not present

## 2017-11-18 DIAGNOSIS — Z510 Encounter for antineoplastic radiation therapy: Secondary | ICD-10-CM | POA: Diagnosis not present

## 2017-11-18 DIAGNOSIS — C50211 Malignant neoplasm of upper-inner quadrant of right female breast: Secondary | ICD-10-CM | POA: Diagnosis not present

## 2017-11-18 DIAGNOSIS — C541 Malignant neoplasm of endometrium: Secondary | ICD-10-CM | POA: Diagnosis not present

## 2017-11-19 ENCOUNTER — Ambulatory Visit (INDEPENDENT_AMBULATORY_CARE_PROVIDER_SITE_OTHER): Payer: Medicare Other | Admitting: Family Medicine

## 2017-11-19 ENCOUNTER — Ambulatory Visit
Admission: RE | Admit: 2017-11-19 | Discharge: 2017-11-19 | Disposition: A | Discharge status: Home / Self Care | Payer: Medicare Other | Source: Ambulatory Visit | Attending: Radiation Oncology | Admitting: Radiation Oncology

## 2017-11-19 ENCOUNTER — Encounter: Payer: Self-pay | Admitting: Family Medicine

## 2017-11-19 VITALS — BP 128/82 | HR 78 | Ht 63.0 in | Wt 185.0 lb

## 2017-11-19 DIAGNOSIS — K760 Fatty (change of) liver, not elsewhere classified: Secondary | ICD-10-CM | POA: Diagnosis not present

## 2017-11-19 DIAGNOSIS — I700 Atherosclerosis of aorta: Secondary | ICD-10-CM

## 2017-11-19 DIAGNOSIS — M810 Age-related osteoporosis without current pathological fracture: Secondary | ICD-10-CM

## 2017-11-19 DIAGNOSIS — R7301 Impaired fasting glucose: Secondary | ICD-10-CM | POA: Diagnosis not present

## 2017-11-19 DIAGNOSIS — C541 Malignant neoplasm of endometrium: Secondary | ICD-10-CM | POA: Diagnosis not present

## 2017-11-19 DIAGNOSIS — C50211 Malignant neoplasm of upper-inner quadrant of right female breast: Secondary | ICD-10-CM | POA: Diagnosis not present

## 2017-11-19 DIAGNOSIS — E876 Hypokalemia: Secondary | ICD-10-CM

## 2017-11-19 DIAGNOSIS — E7800 Pure hypercholesterolemia, unspecified: Secondary | ICD-10-CM

## 2017-11-19 DIAGNOSIS — Z510 Encounter for antineoplastic radiation therapy: Secondary | ICD-10-CM | POA: Diagnosis not present

## 2017-11-19 DIAGNOSIS — Z170 Estrogen receptor positive status [ER+]: Secondary | ICD-10-CM | POA: Diagnosis not present

## 2017-11-19 LAB — LIPID PANEL
Cholesterol: 249 mg/dL — ABNORMAL HIGH (ref <200)
HDL: 47 mg/dL — AB (ref >50)
LDL Cholesterol (Calc): 171 mg/dL (calc) — ABNORMAL HIGH
Non-HDL Cholesterol (Calc): 202 mg/dL (calc) — ABNORMAL HIGH (ref <130)
Total CHOL/HDL Ratio: 5.3 (calc) — ABNORMAL HIGH (ref <5.0)
Triglycerides: 159 mg/dL — ABNORMAL HIGH (ref <150)

## 2017-11-19 LAB — POCT GLYCOSYLATED HEMOGLOBIN (HGB A1C): HEMOGLOBIN A1C: 5.2

## 2017-11-19 MED ORDER — ALENDRONATE SODIUM 70 MG PO TABS: 70.0000 mg | ORAL_TABLET | ORAL | 3 refills | Status: DC

## 2017-11-19 MED ORDER — IBANDRONATE SODIUM 150 MG PO TABS: 150.0000 mg | ORAL_TABLET | ORAL | 3 refills | Status: DC

## 2017-11-19 MED FILL — IBANDRONATE NA 150 MG TAB: 150 | 30 days supply | Qty: 1 | Fill #0

## 2017-11-19 NOTE — Patient Instructions (Addendum)

## 2017-11-19 NOTE — Progress Notes (Signed)
Subjective:    CC: Chronic medical conditions  HPI:  67 year old female is here today for follow-up.  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  Recently she is been having radiation treatments.  Her sixth of 20 radiation treatments.  On her PET scan she was noted to have some aortic atherosclerosis and wanted to discuss that today.  She is actually been out of work since September but says she might actually try to go back part-time.  Hypertension- Pt denies chest pain, SOB, dizziness, or heart palpitations.  Taking meds as directed w/o problems.  Denies medication side effects.    Impaired fasting glucose-no increased thirst or urination. No symptoms consistent with hypoglycemia.  Osteoporosis she also wanted to get back on something for her bones.  She says she originally tried Fosamax but it upset her stomach.  She was then on Evista which seemed to be helpful but she was worried about the side effects and so stopped it.  She also wanted me to check her skin.  She is had some intermittent rashes.  With chemotherapy.  Unfortunately she had a reaction to Taxol.  We will add this to her intolerance list.  She is also had itchy ears that she would like me to look at as well.  She is also noticed on her recent lab work she is been having some borderline low potassium levels.  Past medical history, Surgical history, Family history not pertinant except as noted below, Social history, Allergies, and medications have been entered into the medical record, reviewed, and corrections made.   Review of Systems: No fevers, chills, night sweats, weight loss, chest pain, or shortness of breath.   Objective:    General: Well Developed, well nourished, and in no acute distress.  Neuro: Alert and oriented x3, extra-ocular muscles intact, sensation grossly intact.  HEENT: Normocephalic, atraumatic  Skin: Warm  and dry, no rashes. Cardiac: Regular rate and rhythm, no murmurs rubs or gallops, no lower extremity edema.  Respiratory: Clear to auscultation bilaterally. Not using accessory muscles, speaking in full sentences.   Impression and Recommendations:    HTN - Well controlled. Continue current regimen. Follow up in  6 months.   IFG -stable.  Hemoglobin A1c of 5.2.  Recheck in 1 year.  Fatty liver -we will continue to monitor every 6 months.  Would be due for repeat ultrasound in elastography next fall.  Lab Results  Component Value Date   ALT 168 (H) 11/04/2017   AST 85 (H) 11/04/2017   ALKPHOS 56 11/04/2017   BILITOT 0.4 11/04/2017   Aortic atherosclerosis-discussed the importance of healthy diet, regular exercise and weight loss in addition to taking her statin regularly.  She admits she is been a little inconsistent with it.  Due to recheck lipids.  Osteoporosis-discussed options.  Will try bisphosphonate again.  We will try Boniva at this time since she did not tolerate Fosamax.  Could consider going back to Evista as she did do well on it.  Low potassium - work on potassium rich diet.

## 2017-11-20 ENCOUNTER — Ambulatory Visit
Admission: RE | Admit: 2017-11-20 | Discharge: 2017-11-20 | Disposition: A | Discharge status: Home / Self Care | Payer: Medicare Other | Source: Ambulatory Visit | Attending: Radiation Oncology | Admitting: Radiation Oncology

## 2017-11-20 DIAGNOSIS — C541 Malignant neoplasm of endometrium: Secondary | ICD-10-CM | POA: Diagnosis not present

## 2017-11-20 DIAGNOSIS — Z510 Encounter for antineoplastic radiation therapy: Secondary | ICD-10-CM | POA: Diagnosis not present

## 2017-11-20 DIAGNOSIS — C50211 Malignant neoplasm of upper-inner quadrant of right female breast: Secondary | ICD-10-CM | POA: Diagnosis not present

## 2017-11-20 DIAGNOSIS — Z170 Estrogen receptor positive status [ER+]: Secondary | ICD-10-CM | POA: Diagnosis not present

## 2017-11-23 ENCOUNTER — Ambulatory Visit
Admission: RE | Admit: 2017-11-23 | Discharge: 2017-11-23 | Disposition: A | Discharge status: Home / Self Care | Payer: Medicare Other | Source: Ambulatory Visit | Attending: Radiation Oncology | Admitting: Radiation Oncology

## 2017-11-23 DIAGNOSIS — Z510 Encounter for antineoplastic radiation therapy: Secondary | ICD-10-CM | POA: Diagnosis not present

## 2017-11-23 DIAGNOSIS — Z170 Estrogen receptor positive status [ER+]: Secondary | ICD-10-CM | POA: Diagnosis not present

## 2017-11-23 DIAGNOSIS — C50211 Malignant neoplasm of upper-inner quadrant of right female breast: Secondary | ICD-10-CM | POA: Diagnosis not present

## 2017-11-23 DIAGNOSIS — C541 Malignant neoplasm of endometrium: Secondary | ICD-10-CM | POA: Diagnosis not present

## 2017-11-24 ENCOUNTER — Ambulatory Visit
Admission: RE | Admit: 2017-11-24 | Discharge: 2017-11-24 | Disposition: A | Discharge status: Home / Self Care | Payer: Medicare Other | Source: Ambulatory Visit | Attending: Radiation Oncology | Admitting: Radiation Oncology

## 2017-11-24 ENCOUNTER — Telehealth: Payer: Self-pay | Admitting: Hematology

## 2017-11-24 DIAGNOSIS — Z510 Encounter for antineoplastic radiation therapy: Secondary | ICD-10-CM | POA: Diagnosis not present

## 2017-11-24 DIAGNOSIS — C50211 Malignant neoplasm of upper-inner quadrant of right female breast: Secondary | ICD-10-CM | POA: Diagnosis not present

## 2017-11-24 DIAGNOSIS — C541 Malignant neoplasm of endometrium: Secondary | ICD-10-CM | POA: Diagnosis not present

## 2017-11-24 DIAGNOSIS — Z170 Estrogen receptor positive status [ER+]: Secondary | ICD-10-CM | POA: Diagnosis not present

## 2017-11-24 NOTE — Telephone Encounter (Signed)
11:19 am spoke with patient to confirm FMLA has been successfully faxed to [employer] @ [phone] @ 11:12 am.  Patient requested a copy be left at front desk reception area to be picked up for personal records.

## 2017-11-25 ENCOUNTER — Other Ambulatory Visit: Payer: Medicare Other

## 2017-11-25 ENCOUNTER — Ambulatory Visit
Admission: RE | Admit: 2017-11-25 | Discharge: 2017-11-25 | Disposition: A | Discharge status: Home / Self Care | Payer: Medicare Other | Source: Ambulatory Visit | Attending: Radiation Oncology | Admitting: Radiation Oncology

## 2017-11-25 ENCOUNTER — Inpatient Hospital Stay: Payer: Medicare Other | Attending: Hematology

## 2017-11-25 ENCOUNTER — Inpatient Hospital Stay: Payer: Medicare Other

## 2017-11-25 VITALS — BP 141/69 | HR 77 | Temp 97.9°F | Resp 18

## 2017-11-25 DIAGNOSIS — Z5112 Encounter for antineoplastic immunotherapy: Secondary | ICD-10-CM | POA: Insufficient documentation

## 2017-11-25 DIAGNOSIS — Z170 Estrogen receptor positive status [ER+]: Secondary | ICD-10-CM

## 2017-11-25 DIAGNOSIS — C541 Malignant neoplasm of endometrium: Secondary | ICD-10-CM | POA: Insufficient documentation

## 2017-11-25 DIAGNOSIS — C50211 Malignant neoplasm of upper-inner quadrant of right female breast: Secondary | ICD-10-CM | POA: Insufficient documentation

## 2017-11-25 DIAGNOSIS — C773 Secondary and unspecified malignant neoplasm of axilla and upper limb lymph nodes: Secondary | ICD-10-CM | POA: Diagnosis not present

## 2017-11-25 DIAGNOSIS — Z510 Encounter for antineoplastic radiation therapy: Secondary | ICD-10-CM | POA: Diagnosis not present

## 2017-11-25 LAB — COMPREHENSIVE METABOLIC PANEL
ALBUMIN: 3.8 g/dL (ref 3.5–5.0)
ALT: 145 U/L — ABNORMAL HIGH (ref 0–55)
ANION GAP: 9 (ref 3–11)
AST: 87 U/L — ABNORMAL HIGH (ref 5–34)
Alkaline Phosphatase: 54 U/L (ref 40–150)
BILIRUBIN TOTAL: 0.5 mg/dL (ref 0.2–1.2)
BUN: 9 mg/dL (ref 7–26)
CHLORIDE: 99 mmol/L (ref 98–109)
CO2: 27 mmol/L (ref 22–29)
Calcium: 9.5 mg/dL (ref 8.4–10.4)
Creatinine, Ser: 0.77 mg/dL (ref 0.60–1.10)
GFR calc Af Amer: >60 mL/min (ref >60)
Glucose, Bld: 123 mg/dL (ref 70–140)
POTASSIUM: 3.6 mmol/L (ref 3.5–5.1)
Sodium: 135 mmol/L — ABNORMAL LOW (ref 136–145)
Total Protein: 7.2 g/dL (ref 6.4–8.3)

## 2017-11-25 LAB — CBC WITH DIFFERENTIAL/PLATELET
Basophils Absolute: 0 10*3/uL (ref 0.0–0.1)
Basophils Relative: 0 %
EOS PCT: 2 %
Eosinophils Absolute: 0.1 10*3/uL (ref 0.0–0.5)
HEMATOCRIT: 36.8 % (ref 34.8–46.6)
Hemoglobin: 12.6 g/dL (ref 11.6–15.9)
Lymphocytes Relative: 38 %
Lymphs Abs: 1.1 10*3/uL (ref 0.9–3.3)
MCH: 37.6 pg — ABNORMAL HIGH (ref 25.1–34.0)
MCHC: 34.3 g/dL (ref 31.5–36.0)
MCV: 109.6 fL — ABNORMAL HIGH (ref 79.5–101.0)
MONO ABS: 0.5 10*3/uL (ref 0.1–0.9)
MONOS PCT: 16 %
NEUTROS ABS: 1.2 10*3/uL — AB (ref 1.5–6.5)
Neutrophils Relative %: 44 %
PLATELETS: 143 10*3/uL — AB (ref 145–400)
RBC: 3.36 MIL/uL — ABNORMAL LOW (ref 3.70–5.45)
RDW: 13.4 % (ref 11.2–14.5)
WBC: 2.9 10*3/uL — ABNORMAL LOW (ref 3.9–10.3)

## 2017-11-25 MED ORDER — ACETAMINOPHEN 325 MG PO TABS
650.0000 mg | ORAL_TABLET | Freq: Once | ORAL | Status: AC
  Administered 2017-11-25: 650 mg via ORAL

## 2017-11-25 MED ORDER — ACETAMINOPHEN 325 MG PO TABS
ORAL_TABLET | ORAL | Status: AC
Start: 2017-11-25 — End: 2017-11-25
  Filled 2017-11-25: qty 2

## 2017-11-25 MED ORDER — DIPHENHYDRAMINE HCL 25 MG PO CAPS
50.0000 mg | ORAL_CAPSULE | Freq: Once | ORAL | Status: AC
  Administered 2017-11-25: 50 mg via ORAL

## 2017-11-25 MED ORDER — HEPARIN SOD (PORK) LOCK FLUSH 100 UNIT/ML IV SOLN
500.0000 [IU] | Freq: Once | INTRAVENOUS | Status: AC | PRN
  Administered 2017-11-25: 500 [IU]
  Filled 2017-11-25: qty 5

## 2017-11-25 MED ORDER — DIPHENHYDRAMINE HCL 25 MG PO CAPS
ORAL_CAPSULE | ORAL | Status: AC
  Filled 2017-11-25: qty 2

## 2017-11-25 MED ORDER — SODIUM CHLORIDE 0.9% FLUSH
10.0000 mL | INTRAVENOUS | Status: DC | PRN
  Administered 2017-11-25: 10 mL
  Filled 2017-11-25: qty 10

## 2017-11-25 MED ORDER — TRASTUZUMAB CHEMO 150 MG IV SOLR
6.0000 mg/kg | Freq: Once | INTRAVENOUS | Status: AC
  Administered 2017-11-25: 525 mg via INTRAVENOUS
  Filled 2017-11-25: qty 25

## 2017-11-25 MED ORDER — SODIUM CHLORIDE 0.9 % IV SOLN
Freq: Once | INTRAVENOUS | Status: AC
  Administered 2017-11-25: 10:00:00 via INTRAVENOUS

## 2017-11-25 NOTE — Patient Instructions (Signed)
[facility] Discharge Instructions for Patients Receiving Chemotherapy  Today you received the following chemotherapy agents Herceptin  To help prevent nausea and vomiting after your treatment, we encourage you to take your nausea medication as directed   If you develop nausea and vomiting that is not controlled by your nausea medication, call the clinic.   BELOW ARE SYMPTOMS THAT SHOULD BE REPORTED IMMEDIATELY:  *FEVER GREATER THAN 100.5 F  *CHILLS WITH OR WITHOUT FEVER  NAUSEA AND VOMITING THAT IS NOT CONTROLLED WITH YOUR NAUSEA MEDICATION  *UNUSUAL SHORTNESS OF BREATH  *UNUSUAL BRUISING OR BLEEDING  TENDERNESS IN MOUTH AND THROAT WITH OR WITHOUT PRESENCE OF ULCERS  *URINARY PROBLEMS  *BOWEL PROBLEMS  UNUSUAL RASH Items with * indicate a potential emergency and should be followed up as soon as possible.  Feel free to call the clinic should you have any questions or concerns. The clinic phone number is [phone].  Please show the CHEMO ALERT CARD at check-in to the Emergency Department and triage nurse.   

## 2017-11-26 ENCOUNTER — Ambulatory Visit
Admission: RE | Admit: 2017-11-26 | Discharge: 2017-11-26 | Disposition: A | Discharge status: Home / Self Care | Payer: Medicare Other | Source: Ambulatory Visit | Attending: Radiation Oncology | Admitting: Radiation Oncology

## 2017-11-26 DIAGNOSIS — Z170 Estrogen receptor positive status [ER+]: Secondary | ICD-10-CM | POA: Diagnosis not present

## 2017-11-26 DIAGNOSIS — C50211 Malignant neoplasm of upper-inner quadrant of right female breast: Secondary | ICD-10-CM | POA: Diagnosis not present

## 2017-11-26 DIAGNOSIS — Z510 Encounter for antineoplastic radiation therapy: Secondary | ICD-10-CM | POA: Diagnosis not present

## 2017-11-26 DIAGNOSIS — C541 Malignant neoplasm of endometrium: Secondary | ICD-10-CM | POA: Diagnosis not present

## 2017-11-27 ENCOUNTER — Ambulatory Visit
Admission: RE | Admit: 2017-11-27 | Discharge: 2017-11-27 | Disposition: A | Discharge status: Home / Self Care | Payer: Medicare Other | Source: Ambulatory Visit | Attending: Radiation Oncology | Admitting: Radiation Oncology

## 2017-11-27 DIAGNOSIS — C50211 Malignant neoplasm of upper-inner quadrant of right female breast: Secondary | ICD-10-CM | POA: Diagnosis not present

## 2017-11-27 DIAGNOSIS — Z170 Estrogen receptor positive status [ER+]: Secondary | ICD-10-CM | POA: Diagnosis not present

## 2017-11-27 DIAGNOSIS — Z510 Encounter for antineoplastic radiation therapy: Secondary | ICD-10-CM | POA: Diagnosis not present

## 2017-11-27 DIAGNOSIS — C541 Malignant neoplasm of endometrium: Secondary | ICD-10-CM | POA: Diagnosis not present

## 2017-11-30 ENCOUNTER — Ambulatory Visit (HOSPITAL_BASED_OUTPATIENT_CLINIC_OR_DEPARTMENT_OTHER)
Admission: RE | Admit: 2017-11-30 | Discharge: 2017-11-30 | Disposition: A | Discharge status: Home / Self Care | Payer: Medicare Other | Source: Ambulatory Visit | Attending: Cardiology | Admitting: Cardiology

## 2017-11-30 ENCOUNTER — Ambulatory Visit (HOSPITAL_COMMUNITY)
Admission: RE | Admit: 2017-11-30 | Discharge: 2017-11-30 | Disposition: A | Discharge status: Home / Self Care | Payer: Medicare Other | Source: Ambulatory Visit | Attending: Cardiology | Admitting: Cardiology

## 2017-11-30 ENCOUNTER — Encounter (HOSPITAL_COMMUNITY): Payer: Self-pay | Admitting: Cardiology

## 2017-11-30 ENCOUNTER — Ambulatory Visit: Payer: Medicare Other | Admitting: Radiation Oncology

## 2017-11-30 ENCOUNTER — Ambulatory Visit
Admission: RE | Admit: 2017-11-30 | Discharge: 2017-11-30 | Disposition: A | Discharge status: Home / Self Care | Payer: Medicare Other | Source: Ambulatory Visit | Attending: Radiation Oncology | Admitting: Radiation Oncology

## 2017-11-30 VITALS — BP 128/80 | HR 71 | Wt 190.8 lb

## 2017-11-30 DIAGNOSIS — C50211 Malignant neoplasm of upper-inner quadrant of right female breast: Secondary | ICD-10-CM | POA: Diagnosis not present

## 2017-11-30 DIAGNOSIS — C541 Malignant neoplasm of endometrium: Secondary | ICD-10-CM | POA: Diagnosis not present

## 2017-11-30 DIAGNOSIS — Z170 Estrogen receptor positive status [ER+]: Secondary | ICD-10-CM | POA: Diagnosis not present

## 2017-11-30 DIAGNOSIS — I700 Atherosclerosis of aorta: Secondary | ICD-10-CM

## 2017-11-30 DIAGNOSIS — I10 Essential (primary) hypertension: Secondary | ICD-10-CM | POA: Insufficient documentation

## 2017-11-30 DIAGNOSIS — Z510 Encounter for antineoplastic radiation therapy: Secondary | ICD-10-CM | POA: Diagnosis not present

## 2017-11-30 DIAGNOSIS — E785 Hyperlipidemia, unspecified: Secondary | ICD-10-CM | POA: Insufficient documentation

## 2017-11-30 MED FILL — PROLENSA 0.07% EYE DROPS: 0.07 | 60 days supply | Qty: 3 | Fill #0

## 2017-11-30 MED FILL — MOXIFLOXACIN 0.5% EYE DROPS: 0.5 | 14 days supply | Qty: 3 | Fill #0

## 2017-11-30 MED FILL — PREDNISOLONE AC 1% EYE DROP: 1 | 50 days supply | Qty: 10 | Fill #0

## 2017-11-30 NOTE — Progress Notes (Signed)
Oncologist: [name]  67 yo with history of HTN and hyperlipidemia with newly-diagnosed breast cancer and endometrial cancer. She was referred to cardio-oncology clinic by [name].   Breast cancer was diagnosed on the right in 8/18, ER+/PR+/HER2+. She has had lumpectomy and is getting TCH x 6 cycles to be followed by Herceptin for 6 more months.   Endometrial cancer was also diagnosed in 8/18.  She has had hysterectomy/BSO in 9/18. She is getting radiation currently.   She is generally doing well with her treatment.  She does not have significant exertional dyspnea or chest pain.  No palpitations.    PMH: 1. GERD 2. HTN 3. Hyperlipidemia 4. Endometrial cancer: Diagnosed 8/18. Hysterectomy/BSO in 9/18.  5. Breast cancer: Diagnosed on the right in 8/18, ER+/PR-/HER2+.  S/p lumpectomy.  Plan for [place] x 6 cycles then Herceptin for 6 more months.  - Echo (8/18): EF 65-70%, GLS -18.2%, mild LVH.  - Echo (12/18): EF 60-65%, GLS -18.6%, normal RV size and systolic function.  - Echo (3/19): EF 60-65%, GLS -22.5%, normal RV size and systolic function.   Social History   Socioeconomic History  . Marital status: Married    Spouse name: Not on file  . Number of children: Not on file  . Years of education: Not on file  . Highest education level: Not on file  Social Needs  . Financial resource strain: Not on file  . Food insecurity - worry: Not on file  . Food insecurity - inability: Not on file  . Transportation needs - medical: Not on file  . Transportation needs - non-medical: Not on file  Occupational History    Employer: [employer]    Comment: [employer]   Tobacco Use  . Smoking status: Never Smoker  . Smokeless tobacco: Never Used  Substance and Sexual Activity  . Alcohol use: Yes    Alcohol/week: 0.0 oz    Comment: <1/week wine or beer occasional  . Drug use: No  . Sexual activity: Yes    Birth control/protection: Post-menopausal  Other Topics Concern  .  Not on file  Social History Narrative   2-3 caffeine drinks per day. Regular exercise.  Walking 3 miles a day.    Family History  Problem Relation Age of Onset  . Colon cancer Father 73  . Heart attack Father 56  . Prostate cancer Father        dx 63's  . Bladder Cancer Father 86  . Stroke Mother 90  . Diabetes Maternal Grandfather   . Kidney disease Maternal Grandfather   . Asthma Brother   . Cancer Paternal Grandmother 34       GYN cancer ( thinks ovarian, maybe uterine)  . Heart attack Maternal Grandmother 70  . Breast cancer Other 54  . Colon cancer Other 72  . Cancer Other        type unk, age dx unk   ROS: All systems reviewed and negative except as per HPI.   Current Outpatient Medications  Medication Sig Dispense Refill  . acetaminophen (TYLENOL) 325 MG tablet Take 650 mg by mouth every 6 (six) hours as needed for mild pain or moderate pain.    . Ascorbic Acid (VITAMIN C) 1000 MG tablet Take 1,000 mg by mouth daily.    Marland Kitchen ibandronate (BONIVA) 150 MG tablet Take 1 tablet (150 mg total) by mouth every 30 (thirty) days. Take in the morning with a full glass of water, on an  empty stomach, and do not take anything else by mouth or lie down for the next 30 min. 3 tablet 3  . meclizine (ANTIVERT) 25 MG tablet Take 1-2 tablets (25-50 mg total) by mouth 3 (three) times daily as needed for dizziness. (Patient taking differently: Take 25-50 mg by mouth 3 (three) times daily as needed for dizziness (depends on dizziness if takes 1-2 tablets). ) 30 tablet 0  . Multiple Vitamin (MULTIVITAMIN) capsule Take 1 capsule by mouth daily.      Marland Kitchen olmesartan-hydrochlorothiazide (BENICAR HCT) 20-12.5 MG tablet Take 1 tablet by mouth daily. 90 tablet 2  . Pitavastatin Calcium (LIVALO) 2 MG TABS Take 3 mg by mouth every evening. Takes 1.5 tablet    . prochlorperazine (COMPAZINE) 10 MG tablet Take 1 tablet (10 mg total) by mouth every 6 (six) hours as needed (Nausea or vomiting). 30 tablet 1  . sodium  chloride (MURO 128) 5 % ophthalmic ointment Place 1 application into both eyes 2 (two) times daily.      No current facility-administered medications for this encounter.    Blood pressure 128/80, pulse 71, weight 190 lb 12 oz (86.5 kg), SpO2 98 %. General: NAD Neck: No JVD, no thyromegaly or thyroid nodule.  Lungs: Clear to auscultation bilaterally with normal respiratory effort. CV: Nondisplaced PMI.  Heart regular S1/S2, no S3/S4, 1/6 early SEM RUSB.  No peripheral edema.  No carotid bruit.  Normal pedal pulses.  Abdomen: Soft, nontender, no hepatosplenomegaly, no distention.  Skin: Intact without lesions or rashes.  Neurologic: Alert and oriented x 3.  Psych: Normal affect. Extremities: No clubbing or cyanosis.  HEENT: Normal.   Assessment/Plan: 1. Breast cancer: ER+/PR-/HER2+. She is getting a Herceptin-based regimen. I reviewed today's echo.  LV EF and strain pattern were normal.  - Repeat echo in 3 months.   2. Endometrial cancer: s/p hysterectomy and BSO.  3. HTN: BP controlled.   Loralie Champagne 11/30/2017

## 2017-11-30 NOTE — Patient Instructions (Signed)
Follow up and echocardiogram in 3 months.

## 2017-12-01 ENCOUNTER — Ambulatory Visit
Admission: RE | Admit: 2017-12-01 | Discharge: 2017-12-01 | Disposition: A | Discharge status: Home / Self Care | Payer: Medicare Other | Source: Ambulatory Visit | Attending: Radiation Oncology | Admitting: Radiation Oncology

## 2017-12-01 DIAGNOSIS — Z170 Estrogen receptor positive status [ER+]: Secondary | ICD-10-CM | POA: Diagnosis not present

## 2017-12-01 DIAGNOSIS — Z510 Encounter for antineoplastic radiation therapy: Secondary | ICD-10-CM | POA: Diagnosis not present

## 2017-12-01 DIAGNOSIS — C541 Malignant neoplasm of endometrium: Secondary | ICD-10-CM | POA: Diagnosis not present

## 2017-12-01 DIAGNOSIS — C50211 Malignant neoplasm of upper-inner quadrant of right female breast: Secondary | ICD-10-CM | POA: Diagnosis not present

## 2017-12-02 ENCOUNTER — Ambulatory Visit
Admission: RE | Admit: 2017-12-02 | Discharge: 2017-12-02 | Disposition: A | Discharge status: Home / Self Care | Payer: Medicare Other | Source: Ambulatory Visit | Attending: Radiation Oncology | Admitting: Radiation Oncology

## 2017-12-02 DIAGNOSIS — Z170 Estrogen receptor positive status [ER+]: Secondary | ICD-10-CM | POA: Diagnosis not present

## 2017-12-02 DIAGNOSIS — C50211 Malignant neoplasm of upper-inner quadrant of right female breast: Secondary | ICD-10-CM | POA: Diagnosis not present

## 2017-12-02 DIAGNOSIS — C541 Malignant neoplasm of endometrium: Secondary | ICD-10-CM | POA: Diagnosis not present

## 2017-12-02 DIAGNOSIS — Z510 Encounter for antineoplastic radiation therapy: Secondary | ICD-10-CM | POA: Diagnosis not present

## 2017-12-03 ENCOUNTER — Ambulatory Visit
Admission: RE | Admit: 2017-12-03 | Discharge: 2017-12-03 | Disposition: A | Discharge status: Home / Self Care | Payer: Medicare Other | Source: Ambulatory Visit | Attending: Radiation Oncology | Admitting: Radiation Oncology

## 2017-12-03 DIAGNOSIS — C50211 Malignant neoplasm of upper-inner quadrant of right female breast: Secondary | ICD-10-CM | POA: Diagnosis not present

## 2017-12-03 DIAGNOSIS — Z170 Estrogen receptor positive status [ER+]: Secondary | ICD-10-CM | POA: Diagnosis not present

## 2017-12-03 DIAGNOSIS — Z510 Encounter for antineoplastic radiation therapy: Secondary | ICD-10-CM | POA: Diagnosis not present

## 2017-12-04 ENCOUNTER — Ambulatory Visit
Admission: RE | Admit: 2017-12-04 | Discharge: 2017-12-04 | Disposition: A | Discharge status: Home / Self Care | Payer: Medicare Other | Source: Ambulatory Visit | Attending: Radiation Oncology | Admitting: Radiation Oncology

## 2017-12-04 DIAGNOSIS — C50211 Malignant neoplasm of upper-inner quadrant of right female breast: Secondary | ICD-10-CM | POA: Diagnosis not present

## 2017-12-04 DIAGNOSIS — Z510 Encounter for antineoplastic radiation therapy: Secondary | ICD-10-CM | POA: Diagnosis not present

## 2017-12-04 DIAGNOSIS — Z170 Estrogen receptor positive status [ER+]: Secondary | ICD-10-CM | POA: Diagnosis not present

## 2017-12-07 ENCOUNTER — Ambulatory Visit
Admission: RE | Admit: 2017-12-07 | Discharge: 2017-12-07 | Disposition: A | Discharge status: Home / Self Care | Payer: Medicare Other | Source: Ambulatory Visit | Attending: Radiation Oncology | Admitting: Radiation Oncology

## 2017-12-07 ENCOUNTER — Telehealth: Payer: Self-pay | Admitting: Hematology

## 2017-12-07 DIAGNOSIS — C50211 Malignant neoplasm of upper-inner quadrant of right female breast: Secondary | ICD-10-CM | POA: Diagnosis not present

## 2017-12-07 DIAGNOSIS — Z170 Estrogen receptor positive status [ER+]: Secondary | ICD-10-CM | POA: Diagnosis not present

## 2017-12-07 DIAGNOSIS — Z510 Encounter for antineoplastic radiation therapy: Secondary | ICD-10-CM | POA: Diagnosis not present

## 2017-12-07 MED ORDER — RADIAPLEXRX EX GEL
Freq: Once | CUTANEOUS | Status: AC
  Administered 2017-12-07: 10:00:00 via TOPICAL

## 2017-12-07 NOTE — Telephone Encounter (Signed)
Appointments scheduled per 3/25 phone message from patient

## 2017-12-08 ENCOUNTER — Ambulatory Visit
Admission: RE | Admit: 2017-12-08 | Discharge: 2017-12-08 | Disposition: A | Discharge status: Home / Self Care | Payer: Medicare Other | Source: Ambulatory Visit | Attending: Radiation Oncology | Admitting: Radiation Oncology

## 2017-12-08 DIAGNOSIS — Z510 Encounter for antineoplastic radiation therapy: Secondary | ICD-10-CM | POA: Diagnosis not present

## 2017-12-08 DIAGNOSIS — C50211 Malignant neoplasm of upper-inner quadrant of right female breast: Secondary | ICD-10-CM | POA: Diagnosis not present

## 2017-12-08 DIAGNOSIS — C541 Malignant neoplasm of endometrium: Secondary | ICD-10-CM | POA: Diagnosis not present

## 2017-12-08 DIAGNOSIS — Z170 Estrogen receptor positive status [ER+]: Secondary | ICD-10-CM | POA: Diagnosis not present

## 2017-12-10 DIAGNOSIS — Z170 Estrogen receptor positive status [ER+]: Secondary | ICD-10-CM | POA: Diagnosis not present

## 2017-12-10 DIAGNOSIS — C50911 Malignant neoplasm of unspecified site of right female breast: Secondary | ICD-10-CM | POA: Diagnosis not present

## 2017-12-14 MED FILL — IBANDRONATE NA 150 MG TAB: 150 | 90 days supply | Qty: 3 | Fill #1

## 2017-12-15 ENCOUNTER — Encounter: Payer: Self-pay | Admitting: Radiation Oncology

## 2017-12-15 NOTE — Progress Notes (Signed)
  Radiation Oncology         [phone] ________________________________  Name: Dawn [name] MRN: [id number]  Date: 12/15/2017  DOB: November 05, 1950  End of Treatment Note  Diagnosis:   Endometrial cancer [facility]) Staging form: Corpus Uteri - Adenosarcoma, AJCC 8th Edition - Pathologic stage from 06/09/2017: Stage IIIC (pT1a, pN1, cM0) - Signed by Truitt Merle, MD on 06/16/2017  Malignant neoplasm of upper-inner quadrant of right breast in female, estrogen receptor positive ([place]) Staging form: Breast, AJCC 8th Edition - Clinical stage from 05/06/2017: Stage IA (cT1c, cN0, cM0, G2, ER: Positive, PR: Negative, HER2: Positive) - Unsigned - Pathologic stage from 05/25/2017: Stage IIA (pT2, pN0, cM0, G2, ER: Positive, PR: Negative, HER2: Negative) - Signed by Truitt Merle, MD on 06/16/2017     Indication for treatment:  Curative       Radiation treatment dates:   11/11/17 - 12/08/17  Site/dose:  1) Right breast treated to 40.05 Gy with 15 fx of 2.67 Gy and a  2) boost of 10 Gy in 5 fx  Beams/energy:   1) 3D / 6X, 10X 2) electrons / 12 MeV  Narrative: The patient tolerated radiation treatment relatively well.   She denied pain but did endorse itching at right upper inner quadrant that she treated with radiaplex.  Plan: The patient has completed radiation treatment. The patient will return to radiation oncology clinic for routine followup in one month. I advised them to call or return sooner if they have any questions or concerns related to their recovery or treatment.  -----------------------------------  [name], MD  This document serves as a record of services personally performed by [name], MD. It was created on his behalf by Linward Natal, a trained medical scribe. The creation of this record is based on the scribe's personal observations and the provider's statements to them. This document has been checked and approved by the attending provider.

## 2017-12-15 NOTE — Progress Notes (Signed)
Dawn [name]  [phone] [phone]) [phone]  Clinic Follow up Note   Patient Care Team: [name], MD as PCP - Haskell Riling, MD as Consulting Physician (General Surgery) Truitt Merle, MD as Consulting Physician (Hematology) [name], MD as Attending Physician (Radiation Oncology)   Date of Service:  12/16/2017  CHIEF COMPLAINTS:  Follow up right breast cancer and endometrial cancer     Malignant neoplasm of upper-inner quadrant of right breast in female, estrogen receptor positive ([place])   04/27/2017 Initial Biopsy    Diagnosis Breast, right, needle core biopsy INVASIVE DUCTAL CARCINOMA, GRADE 2 Microscopic Comment The neoplasm has intracellular mucin and signet ring cell features. Immunostains shows these cells are positive for ER,GATA3, ck7 and GCDFP, negative for ck20, cdx2, TTF-1 and pax8, The immunostaining pattern supports the neoplasm is breast primary.      04/27/2017 Receptors her2    Estrogen Receptor: 70%, POSITIVE, MODERATE STAINING INTENSITY Progesterone Receptor: 0%, NEGATIVE Proliferation Marker Ki67: 30% HER2 - **POSITIVE** RATIO OF HER2/CEP17 SIGNALS 2.91 AVERAGE HER2 COPY NUMBER PER CELL 7.28      04/27/2017 Initial Diagnosis    Malignant neoplasm of upper-inner quadrant of right breast in female, estrogen receptor positive ([place])      05/07/2017 Imaging    CT Chest W Contrast 05/07/17 IMPRESSION: Tiny well-defined fatty lesion on the pleura at the right lung base. The thinner slice collimation used for today's chest CT eliminates volume-averaging seen in the lesion on the prior exam and confirms that this is a diffusely fatty nodule. This is a benign finding and likely represents a tiny lipoma. No defect in the hemidiaphragm evident to suggest tiny diaphragmatic hernia. Pulmonary hamartoma a consideration although the lack of soft tissue components makes this less likely.      05/15/2017 Genetic  Testing    Patient had genetic testing due to a personal history of breast cancer and uterine cancer as well as a family history of cancer. The Multi-Cancer panel was ordered. The Multi-Cancer Panel offered by [facility] includes sequencing and/or deletion duplication testing of the following 83 genes: ALK, APC, ATM, AXIN2,BAP1,  BARD1, BLM, BMPR1A, BRCA1, BRCA2, BRIP1, CASR, CDC73, CDH1, CDK4, CDKN1B, CDKN1C, CDKN2A (p14ARF), CDKN2A (p16INK4a), CEBPA, CHEK2, CTNNA1, DICER1, DIS3L2, EGFR (c.2369C>T, p.Thr790Met variant only), EPCAM (Deletion/duplication testing only), FH, FLCN, GATA2, GPC3, GREM1 (Promoter region deletion/duplication testing only), HOXB13 (c.251G>A, p.Gly84Glu), HRAS, KIT, MAX, MEN1, MET, MITF (c.952G>A, p.Glu318Lys variant only), MLH1, MSH2, MSH3, MSH6, MUTYH, NBN, NF1, NF2, NTHL1, PALB2, PDGFRA, PHOX2B, PMS2, POLD1, POLE, POT1, PRKAR1A, PTCH1, PTEN, RAD50, RAD51C, RAD51D, RB1, RECQL4, RET, RUNX1, SDHAF2, SDHA (sequence changes only), SDHB, SDHC, SDHD, SMAD4, SMARCA4, SMARCB1, SMARCE1, STK11, SUFU, TERC, TERT, TMEM127, TP53, TSC1, TSC2, VHL, WRN and WT1.   Results: No pathogenic mutations were identified. A VUS in the GATA2 gene c.1348G>A (p.Gly450Arg) was identified.  The date of this test report is 05/25/2017.        05/25/2017 Surgery    RIGHT BREAST LUMPECTOMY WITH RADIOACTIVE SEED AND  RIGHT AXILLARY SENTINEL LYMPH NODE BIOPSY and Pot placement by Dr. Excell Seltzer 05/25/17      05/25/2017 Pathology Results    Diagnosis 05/25/17 1. Breast, lumpectomy, right - INVASIVE DUCTAL CARCINOMA, GRADE II/III, SPANNING 2.2 CM. - DUCTAL CARCINOMA IN SITU, HIGH GRADE. - THE SURGICAL RESECTION MARGINS ARE NEGATIVE FOR CARCINOMA. - SEE ONCOLOGY TABLE BELOW. 2. Breast, excision, right additional superior margin - BENIGN FIBROADIPOSE TISSUE. - BENIGN SKELETAL MUSCLE. - SEE COMMENT. 3. Breast, excision, right additional lateral  margin - BENIGN BREAST PARENCHYMA. - THERE IS NO EVIDENCE OF  MALIGNANCY. - SEE COMMENT. 4. Breast, excision, chest wall margin - BENIGN FIBROADIPOSE TISSUE. - THERE IS NO EVIDENCE OF MALIGNANCY. - SEE COMMENT. 5. Lymph node, sentinel, biopsy, right axillary - THERE IS NO EVIDENCE OF CARCINOMA IN 1 OF 1 LYMPH NODE (0/1). 6. Lymph node, sentinel, biopsy, right axillary - THERE IS NO EVIDENCE OF CARCINOMA IN 1 OF 1 LYMPH NODE (0/1). 7. Lymph node, sentinel, biopsy, right axillary - THERE IS NO EVIDENCE OF CARCINOMA IN 1 OF 1 LYMPH NODE (0/1).      06/26/2017 PET scan    PET  IMPRESSION: 1. Postoperative findings both in the right breast and in the anatomic pelvis, with associated low-grade activity considered to be postoperative in nature. No hypermetabolic adenopathy or hypermetabolic lesions are identified to suggest active metastatic disease/malignancy. 2. Other imaging findings of potential clinical significance: Aortic Atherosclerosis (ICD10-I70.0). Sigmoid colon diverticulosis. Biapical pleuroparenchymal scarring in the lungs. Small amount of free pelvic fluid in the cul-de-sac, likely postoperative.       07/01/2017 - 10/14/2017 Chemotherapy    Adjuvant TCH with Onpro every 3 weeks for 6 cycles starting on 07/01/17, Changed Taxol to abraxane with cycle 4 due to drug rash reaction, followed by Herceptin every 3 weeks for 6 months       11/01/2017 - 12/08/2017 Radiation Therapy    Radiation therapy to her right breast with [name]       Endometrial adenocarcinoma [facility])   05/07/2017 Initial Diagnosis    Endometrial adenocarcinoma ([place])      06/09/2017 Surgery    XI ROBOTIC ASSISTED TOTAL LAPOROSCOPIC HYSTERECTOMY WITH BILATERAL SALPINGO OOPHORECTOMY and SENTINEL NODE BIOPSY by Dr. Denman George 06/09/17      06/09/2017 Pathology Results    Diagnosis 06/09/17 1. Lymph node, sentinel, biopsy, right external iliac - METASTATIC ADENOCARCINOMA IN ONE LYMPH NODE (1/1). 2. Lymph node, sentinel, biopsy, left obturator - ONE BENIGN LYMPH NODE  (0/1). 3. Lymph node, sentinel, biopsy, left external iliac - METASTATIC ADENOCARCINOMA IN ONE LYMPH NODE (1/1). 4. Uterus +/- tubes/ovaries, neoplastic ENDOMETRIUM: - ENDOMETRIAL ADENOCARCINOMA, 3.2 CM. - CARCINOMA INVADES INNER HALF OF MYOMETRIUM. 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 - CERVIX, BILATERAL FALLOPIAN TUBES AND BILATERAL OVARIES FREE OF TUMOR      08/18/2017 - 09/17/2017 Radiation Therapy    vaginal brachytherapy per [name] on starting 08/18/17       HISTORY OF PRESENTING ILLNESS (05/06/2017):  Dawn [name] 67 y.o. female is here because of newly diagnosed breast cancer. Screening mammogram detected right breast mass on 04/20/2017. Ultrasound showed 1.5 cm mass in the 1:00 position. The axilla was negative on ultrasound. Biopsy of the right breast was performed on 04/27/2017 revealing invasive ductal carcinoma, Grade 2, ER 70% / PR 0% / HER-2+ / Ki-67 30%.   Of note, she was also recently diagnosed with endometrial cancer. Biopsy of curettage endometrium on 04/29/2017 revealed adenocarcinoma. Immunohistology shows the tumor is positive with estrogen receptor, progesterone receptor with patchy positivity with carcinoembryonic antigen and p16. The tumor is negative with CD10 and vimentin. The morphology and immunophenotype are not definitive as to endocervical or endometrial origin. There are rare fragments with endometrial type stroma, which may indicate an endometrial origin although this is not definitive as to origin.  The patient presents today in our multidisciplinary breast clinic. She is doing well overall. She went to her PCP with abdominal pain and vaginal bleeding which felt like she was  having her period all the time. Then she presented to the ER with severe vertigo and heavy vaginal bleeding. She has been having heavy vaginal bleeding since mid-July. She does not have a history of anemia and associates it with recent bleeding. She has been taking oral iron. She  took an aspirin daily but, has not been taking it lately. She had been taking Evista for one year and stopped taking it on her own. She was taking this medication for her bone density. She takes 2-3 Tylenol daily to manage the abdominal pain / cramping discomfort. She believes this pain is associated with passing clots. She takes a multivitamin daily. The patient is apprehensive about chemotherapy and is unsure if she would like to proceed with chemotherapy if it is recommended. She reports leg cramping. She does not take potassium supplements but, does try to eat bananas. States liver function is typically elevated and has been for about the last 2-3 years.   She has never smoked and doesn't drink alcohol. She did grow up with heavy smoking parents.   Father had colon cancer diagnosed at 66 years-old, and he died of bladder cancer. Paternal grandmother with some type of gynecologic cancer, age of onset in her mid-83s. Patient believes there was more cancer in her family in aunts and uncles however, there are no more living family members to discuss this with.   GYN HISTORY  Menarchal: 8 LMP: age of 81 Contraceptive: none  HRT: none  G2P2: She was 89 when she gave first live births.   CURRENT THERAPY: Maintenance Herceptin, PENDING Adjuvant letrozole daily   INTERVAL HISTORY:  Dawn [name] is here for a follow up and to discuss anti-estrogen therapy. She presents to the clinic today accompanied by her husband. She completed adjuvant Radiation Therapy on her right breast with [name] last week. She reports it went well and she is recovering. She still has moderate fatigue. She notes she is having cataract surgery next week.   On review of systems, pt denies discharge, skin peeling, or any other complaints at this time. Pertinent positives are listed and detailed within the above HPI.   MEDICAL HISTORY:  Past Medical History:  Diagnosis Date  . Anemia    as a child.  . Arthritis   .  Bilateral cataracts   . Bilateral leg cramps   . Cancer ([place])    skin  . Dyspnea   . Family history of bladder cancer   . Family history of colon cancer in father   . Fuchs' corneal dystrophy   . GERD (gastroesophageal reflux disease)   . Hearing loss    Right side 30%  . History of hiatal hernia    small size  . History of radiation therapy 08/21/17, 08/28/17,09/01/17, 09/11/17, 09/17/17   Vaginal cuff brachytherapy.   . Hyperlipidemia   . Hypertension   . IBS (irritable bowel syndrome)    hx of  . Malignant neoplasm of upper-inner quadrant of right female breast ([place])   . NAFL (nonalcoholic fatty liver)   . Obesity   . PONV (postoperative nausea and vomiting)   . 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 Uterine cancer ([place])    endometrial cancer  . Varices, gastric   . Vertigo     SURGICAL HISTORY: Past Surgical History:  Procedure Laterality Date  . BREAST BIOPSY Right 04/27/2017  . BREAST LUMPECTOMY WITH RADIOACTIVE SEED AND SENTINEL LYMPH NODE BIOPSY Right 05/25/2017  Procedure: RIGHT BREAST LUMPECTOMY WITH RADIOACTIVE SEED AND  RIGHT AXILLARY SENTINEL LYMPH NODE BIOPSY;  Surgeon: Excell Seltzer, MD;  Location: [facility];  Service: General;  Laterality: Right;  . COLONOSCOPY    . HYSTEROSCOPY W/D&C N/A 04/29/2017   Procedure: DILATATION AND CURETTAGE /HYSTEROSCOPY;  Surgeon: Linda Hedges, DO;  Location: [facility];  Service: Gynecology;  Laterality: N/A;  . PORTACATH PLACEMENT Left 05/25/2017   Procedure: INSERTION PORT-A-CATH;  Surgeon: Excell Seltzer, MD;  Location: [place];  Service: General;  Laterality: Left;  . ROBOTIC ASSISTED TOTAL HYSTERECTOMY WITH BILATERAL SALPINGO OOPHERECTOMY N/A 06/09/2017   Procedure: XI ROBOTIC ASSISTED TOTAL LAPOROSCOPIC HYSTERECTOMY WITH BILATERAL SALPINGO OOPHORECTOMY;  Surgeon: Everitt Amber, MD;  Location: [facility];  Service: Gynecology;  Laterality: N/A;  . SENTINEL NODE  BIOPSY N/A 06/09/2017   Procedure: SENTINEL NODE BIOPSY;  Surgeon: Everitt Amber, MD;  Location: [facility];  Service: Gynecology;  Laterality: N/A;    SOCIAL HISTORY: Social History   Socioeconomic History  . Marital status: Married    Spouse name: Not on file  . Number of children: Not on file  . Years of education: Not on file  . Highest education level: Not on file  Occupational History    Employer: [employer]    Comment: Nurse, mental health research   Social Needs  . Financial resource strain: Not on file  . Food insecurity:    Worry: Not on file    Inability: Not on file  . Transportation needs:    Medical: Not on file    Non-medical: Not on file  Tobacco Use  . Smoking status: Never Smoker  . Smokeless tobacco: Never Used  Substance and Sexual Activity  . Alcohol use: Yes    Alcohol/week: 0.0 oz    Comment: <1/week wine or beer occasional  . Drug use: No  . Sexual activity: Yes    Birth control/protection: Post-menopausal  Lifestyle  . Physical activity:    Days per week: Not on file    Minutes per session: Not on file  . Stress: Not on file  Relationships  . Social connections:    Talks on phone: Not on file    Gets together: Not on file    Attends religious service: Not on file    Active member of club or organization: Not on file    Attends meetings of clubs or organizations: Not on file    Relationship status: Not on file  . Intimate partner violence:    Fear of current or ex partner: Not on file    Emotionally abused: Not on file    Physically abused: Not on file    Forced sexual activity: Not on file  Other Topics Concern  . Not on file  Social History Narrative   2-3 caffeine drinks per day. Regular exercise.  Walking 3 miles a day.     FAMILY HISTORY: Family History  Problem Relation Age of Onset  . Colon cancer Father 2  . Heart attack Father 35  . Prostate cancer Father        dx 24's  . Bladder Cancer Father 58  . Stroke Mother 17    . Diabetes Maternal Grandfather   . Kidney disease Maternal Grandfather   . Asthma Brother   . Cancer Paternal Grandmother 40       GYN cancer ( thinks ovarian, maybe uterine)  . Heart attack Maternal Grandmother 70  . Breast cancer Other 35  .  Colon cancer Other 70  . Cancer Other        type unk, age dx unk    ALLERGIES:  is allergic to ciprofloxacin; crestor [rosuvastatin calcium]; fosamax [alendronate sodium]; penicillins; and taxol [paclitaxel].  MEDICATIONS:  Current Outpatient Medications  Medication Sig Dispense Refill  . acetaminophen (TYLENOL) 325 MG tablet Take 650 mg by mouth every 6 (six) hours as needed for mild pain or moderate pain.    . Ascorbic Acid (VITAMIN C) 1000 MG tablet Take 1,000 mg by mouth daily.    Marland Kitchen ibandronate (BONIVA) 150 MG tablet Take 1 tablet (150 mg total) by mouth every 30 (thirty) days. Take in the morning with a full glass of water, on an empty stomach, and do not take anything else by mouth or lie down for the next 30 min. 3 tablet 3  . meclizine (ANTIVERT) 25 MG tablet Take 1-2 tablets (25-50 mg total) by mouth 3 (three) times daily as needed for dizziness. (Patient taking differently: Take 25-50 mg by mouth 3 (three) times daily as needed for dizziness (depends on dizziness if takes 1-2 tablets). ) 30 tablet 0  . Multiple Vitamin (MULTIVITAMIN) capsule Take 1 capsule by mouth daily.      Marland Kitchen olmesartan-hydrochlorothiazide (BENICAR HCT) 20-12.5 MG tablet Take 1 tablet by mouth daily. 90 tablet 2  . Pitavastatin Calcium (LIVALO) 2 MG TABS Take 3 mg by mouth every evening. Takes 1.5 tablet    . prochlorperazine (COMPAZINE) 10 MG tablet Take 1 tablet (10 mg total) by mouth every 6 (six) hours as needed (Nausea or vomiting). 30 tablet 1  . sodium chloride (MURO 128) 5 % ophthalmic ointment Place 1 application into both eyes 2 (two) times daily.     Marland Kitchen letrozole (FEMARA) 2.5 MG tablet Take 1 tablet (2.5 mg total) by mouth daily. 30 tablet 2   No current  facility-administered medications for this visit.     REVIEW OF SYSTEMS:   Constitutional: Denies fevers, chills or abnormal night sweats  (+) complete hair loss (+) fatigue  Eyes: Denies blurriness of vision, double vision or watery eyes Ears, nose, mouth, throat, and face: Denies mucositis or sore throat Respiratory: Denies cough, dyspnea or wheezes Cardiovascular: Denies palpitation, chest discomfort or lower extremity swelling Gastrointestinal:  Denies nausea, vomiting, constipation, diarrhea, heartburn or change in bowel habits Skin: Denies abnormal skin rashes.  Lymphatics: Denies new lymphadenopathy or easy bruising Musculoskeletal: Negative Neurological:Denies numbness, new weaknesses (+) vertigo (+) neuropathy in fingers and feet Behavioral/Psych: Mood is stable, no new changes  MSK: (+) left rib and back pain All other systems were reviewed with the patient and are negative.  PHYSICAL EXAMINATION:  ECOG PERFORMANCE STATUS: 1 - Symptomatic but completely ambulatory  Vitals:   12/16/17 1135  BP: 128/76  Pulse: 74  Resp: 18  SpO2: 98%   Filed Weights   12/16/17 1135  Weight: 190 lb 4.8 oz (86.3 kg)     GENERAL:alert, no distress and comfortable SKIN: skin color, texture, turgor are normal, no rashes or significant lesions EYES: normal, conjunctiva are pink and non-injected, sclera clear OROPHARYNX:no exudate, no erythema and lips, buccal mucosa, and tongue normal  NECK: supple, thyroid normal size, non-tender, without nodularity LYMPH:  no palpable lymphadenopathy in the cervical, axillary or inguinal LUNGS: clear to auscultation with normal breathing effort HEART: regular rate & rhythm and no lower extremity edema (+) heart murmur in the aortic valve ABDOMEN:abdomen soft, non-tender and normal bowel sounds (+) s/p total hysterectomy and salpingo-oophorectomy,  4 horizontal laparoscopic surgical incisions at the level of the umbilicus, healing well  Musculoskeletal:no  cyanosis of digits and no clubbing  PSYCH: alert & oriented x 3 with fluent speech NEURO: no focal motor/sensory deficits BREAST: Left breast shows no palpable mass or adenopathy. Right breast shows no palpable mass or adenopathy. (+) S/p right lumpectomy and radiation, Her surgical incision in right axilla and around the areola healed well without skin erythema or discharge. She has diffuse skin erythema over the right breast secondary to radiation   LABORATORY DATA:  I have reviewed the data as listed CBC Latest Ref Rng & Units 12/16/2017 11/25/2017 11/04/2017  WBC 3.9 - 10.3 K/uL 3.0(L) 2.9(L) 4.1  Hemoglobin 11.6 - 15.9 g/dL 13.2 12.6 10.4(L)  Hematocrit 34.8 - 46.6 % 38.7 36.8 30.5(L)  Platelets 145 - 400 K/uL 122(L) 143(L) 120(L)    CMP Latest Ref Rng & Units 12/16/2017 11/25/2017 11/04/2017  Glucose 70 - 140 mg/dL 109 123 126  BUN 7 - 26 mg/dL '9 9 9  '$ Creatinine 0.60 - 1.10 mg/dL 0.75 0.77 0.76  Sodium 136 - 145 mmol/L 135(L) 135(L) 137  Potassium 3.5 - 5.1 mmol/L 3.5 3.6 3.4(L)  Chloride 98 - 109 mmol/L 100 99 101  CO2 22 - 29 mmol/L '27 27 27  '$ Calcium 8.4 - 10.4 mg/dL 9.5 9.5 9.4  Total Protein 6.4 - 8.3 g/dL 7.3 7.2 6.9  Total Bilirubin 0.2 - 1.2 mg/dL 0.5 0.5 0.4  Alkaline Phos 40 - 150 U/L 57 54 56  AST 5 - 34 U/L 80(H) 87(H) 85(H)  ALT 0 - 55 U/L 138(H) 145(H) 168(H)   PATHOLOGY FINDINGS:   Diagnosis 06/09/17 1. Lymph node, sentinel, biopsy, right external iliac - METASTATIC ADENOCARCINOMA IN ONE LYMPH NODE (1/1). 2. Lymph node, sentinel, biopsy, left obturator - ONE BENIGN LYMPH NODE (0/1). 3. Lymph node, sentinel, biopsy, left external iliac - METASTATIC ADENOCARCINOMA IN ONE LYMPH NODE (1/1). 4. Uterus +/- tubes/ovaries, neoplastic ENDOMETRIUM: - ENDOMETRIAL ADENOCARCINOMA, 3.2 CM. - CARCINOMA INVADES INNER HALF OF MYOMETRIUM. 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 - CERVIX, BILATERAL FALLOPIAN TUBES AND BILATERAL OVARIES FREE OF TUMOR. Microscopic Comment 4.  ONCOLOGY TABLE-UTERUS, CARCINOMA OR CARCINOSARCOMA Specimen: Uterus with bilateral fallopian tubes and ovaries, right and left external iliac nodes and left obturator lymph node. Procedure: Hysterectomy with bilateral salpingo-oophorectomy and lymph node biopsies. Lymph node sampling performed: Yes. Specimen integrity: Intact. Maximum tumor size: 3.2 cm. Histologic type: Mixed, endometrioid, clear cell and serous. Grade: III. Myometrial invasion: 0.8 cm where myometrium is 2.2 cm in thickness. Cervical stromal involvement: No. Extent of involvement of other organs: N/A. Lymph - vascular invasion: Present. Peritoneal washings: N/A. Lymph nodes: Examined: 3 Sentinel 1 of 3 FINAL for Dawn [name], Dawn [name] ([id number]) Microscopic Comment(continued) 0 Non-sentinel 3 Total Lymph nodes with metastasis: 2. Isolated tumor cells (< 0.2 mm): 0. Micrometastasis: (> 0.2 mm and < 2.0 mm): 0. Macrometastasis: (> 2.0 mm): 2. Extracapsular extension: No. TNM code: pT1a, pN1. FIGO Stage (based on pathologic findings, needs clinical correlation): IIIC1. Comments: The tumor is 3.2 cm in greatest dimension and invades the myometrium to a depth of 0.8 cm where the myometrium is 2.2 cm in thickness and there is lymphatic vascular involvement by tumor. 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 (JDP:ah 06/10/17)   Diagnosis 05/25/17 1. Breast, lumpectomy, right - INVASIVE DUCTAL CARCINOMA, GRADE II/III, SPANNING 2.2 CM. - DUCTAL CARCINOMA IN SITU, HIGH GRADE. - THE SURGICAL RESECTION MARGINS ARE NEGATIVE FOR CARCINOMA. -  SEE ONCOLOGY TABLE BELOW. 2. Breast, excision, right additional superior margin - BENIGN FIBROADIPOSE TISSUE. - BENIGN SKELETAL MUSCLE. - SEE COMMENT. 3. Breast, excision, right additional lateral margin - BENIGN BREAST PARENCHYMA. - THERE IS NO EVIDENCE OF MALIGNANCY. - SEE COMMENT. 4. Breast, excision, chest wall margin - BENIGN FIBROADIPOSE TISSUE. -  THERE IS NO EVIDENCE OF MALIGNANCY. - SEE COMMENT. 5. Lymph node, sentinel, biopsy, right axillary - THERE IS NO EVIDENCE OF CARCINOMA IN 1 OF 1 LYMPH NODE (0/1). 6. Lymph node, sentinel, biopsy, right axillary - THERE IS NO EVIDENCE OF CARCINOMA IN 1 OF 1 LYMPH NODE (0/1). 7. Lymph node, sentinel, biopsy, right axillary - THERE IS NO EVIDENCE OF CARCINOMA IN 1 OF 1 LYMPH NODE (0/1). Microscopic Comment 1. BREAST, INVASIVE TUMOR Procedure: Seed localized lumpectomy, additional superior, lateral, and chest wall margin resections and axillary lymph node resections. Laterality: Right. Tumor Size: 2.2 cm (gross measurement). Histologic Type: Ductal. Microscopic Comment(continued) Grade: II. Tubular Differentiation: 2. Nuclear Pleomorphism: 2. Mitotic Count: 2. Ductal Carcinoma in Situ (DCIS): Present, high grade. Extent of Tumor: Confined to breast parenchyma. Margins: Greater than or equal to 0.2 cm to all margins. Regional Lymph Nodes: Number of Lymph Nodes Examined: 3. Number of Sentinel Lymph Nodes Examined: 3. Lymph Nodes with Macrometastases: 0. Lymph Nodes with Micrometastases: 0. Lymph Nodes with Isolated Tumor Cells: 0. Breast Prognostic Profile: Case [id number]. Estrogen Receptor: 70%, moderate. Progesterone Receptor: 0%. Her2: Amplification was detected. The ratio was 2.91. Ki-67: 30%. Best tumor block for sendout testing: 1A-1C. Pathologic Stage Classification (pTNM, AJCC 8th Edition): Primary Tumor (pT): pT2. Regional Lymph Nodes (pN): pN0. Distant Metastases (pM): pMX. 2. - 4. The surgical resection margin(s) of the specimen were inked and microscopically evaluated.  Diagnosis 04/29/2017 Endometrium, curettage - ADENOCARCINOMA. - SEE MICROSCOPIC DESCRIPTION. Microscopic Comment There are fragments of adenocarcinoma, some of which have cribriform architecture, and there is focal extracellular mucin. Immunohistochemistry shows the tumor is positive with  estrogen receptor, progesterone receptor with patchy positivity with carcinoembryonic antigen and p16. The tumor is negative with CD10 and vimentin. The morphology and immunophenotype are not definitive as to endocervical or endometrial origin. There are rare fragments with endometrial type stroma, which may indicate an endometrial origin although this is not definitive as to origin.  Diagnosis 04/27/2017 Breast, right, needle core biopsy INVASIVE DUCTAL CARCINOMA, GRADE 2 Microscopic Comment The neoplasm has intracellular mucin and signet ring cell features. Immunostains shows these cells are positive for ER, GATA3, ck7 and GCDFP, negative for ck20, cdx2, TTF-1 and pax8, The immunostaining pattern supports the neoplasm is breast primary. The Breast prognostic profile has been ordered. Results: IMMUNOHISTOCHEMICAL AND MORPHOMETRIC ANALYSIS PERFORMED MANUALLY Estrogen Receptor: 70%, POSITIVE, MODERATE STAINING INTENSITY Progesterone Receptor: 0%, NEGATIVE Proliferation Marker Ki67: 30% Results: HER2 - **POSITIVE** RATIO OF HER2/CEP17 SIGNALS 2.91 AVERAGE HER2 COPY NUMBER PER CELL 7.28  GENETICS 05/07/17   PROCEDURES  ECHO 11/30/17 Impressions: - Normal LV size and systolic function, EF 16-01%. Strain as above.   Normal RV size and systolic function. No significant valvular   abnormalities.  ECHO 08/27/17  Impressions: - Normal LV size and systolic function, EF 09-32%. Strain as above.   Mildly dilated RV with normal systolic function.  ECHO 05/07/17 Impressions: - Normal LV size with mild LV hypertrophy. EF 65-70%, vigorous   systolic function. Strain as above. Normal RV size and systolic   function. No significant valvular abnormalities.  RADIOGRAPHIC STUDIES: I have personally reviewed the radiological images as listed and agreed with the  findings in the report. No results found.  ASSESSMENT & PLAN:  67 y.o. 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 Malignant neoplasm of upper inner quadrant of right breast , Invasive ductal carcinoma, pT2N0M0, G2,  ER+/PR-/HER2+ -She underwent right breast lumpectomy with sentinel lymph node biopsy with Dr. Excell Seltzer on 05/25/2017 with port placement -Pathology confirmed invasive ductal carcinoma, grade 2, spanning 2.2 cm, margins were clear, no positive lymph nodes, ER positive, PR negative, HER-2 positive. I reviewed this with the patient previously -We previously discussed the risk of cancer recurrence after completed surgical resection.  Due to the HER-2 positive disease, she is at moderate to high risk  I recommended adjuvant chemotherapy at that time. The regiment was selected to cover for endometrial cancer also. -She completed adjuvant TCH every 3 weeks with Onpro from 07/01/17 - 10/14/17. I changed Taxol to abraxane with cycle 4 due to drug rash reaction. She tolerated relatively well besides the rash and Grade 1 neuropathy. I encouraged her to take a B complex vitamin.  -She is currently on Herceptin maintenance therapy began on 11/04/17. Plan for a total of 12 months. Will complete in Oct 2019.  -She started adjuvant radiation therapy on her breast on 11/01/17 with [name] and completed on 12/08/17. -Given the strong ER and PR positivity, I do recommend adjuvant aromatase inhibitor to reduce her risk of cancer recurrence,  The potential benefit and side effects, which includes but not limited to, hot flash, skin and vaginal dryness, metabolic changes ( increased blood glucose, cholesterol, weight, etc.), slightly in increased risk of cardiovascular disease, cataracts, muscular and joint discomfort, osteopenia and osteoporosis, etc, were discussed with her in great details. She is interested, and we'll start with Letrozole next week.  -she states she does get hot flashes, I will monitor this while beginning Letrozole.  -labs reviewed, her anemia  has resolved. Adequate to continue with cycle 9 herceptin maintenance therapy, for a total of one year  -Plan for a CT CAP w Contrast  -F/u in 9 weeks, will schedule her for survivorship clinic at next visit   2. Endometrial adenocarcinoma with lymph node metastasis, pT1apN1M0, FIGO stage IIIc -She underwent a total hysterectomy with salpingo-oophorectomy  by Dr. Denman George on 06/09/17 -We previously discussed her surgical pathology. she has high risk features including lymphovascular involvement, nodal positive disease, and mixed histology with endometrioid, clear cell, and serous carcinoma  -We previously discussed the high risk of cancer recurrence after surgery extensively, and I strongly encouraged her to consider adjuvant chemotherapy to reduce her risk of recurrence. -She completed Taxol/carbo (taxol changed to abraxane) every 3 weeks x6 cycles on 10/14/17 and adjuvant vaginal brachytherapy from 08/18/17-09/17/17.  She tolerated well overall.  She experiences mild vaginal bleeding when using dilator as instructed per rad onc. -We will continue cancer surveillance, repeat CT scan every 6-12 months, up to 5 years  3. Genetics -Given her personal history of breast and endometrial cancer, and a family history of malignancy, she will be referred to see a genetic counselor for genetic testing to ruled out cancer syndromes. -Genetics results were negative   4. HTN -We monitored her Hypertension while on chemo, she previously held HCTZ on days of chemo -F/u with PCP  5. Vertigo  -Symptoms have returned after surgery  -She restarted her Meclizine, much improved   6.  Transaminitis - LFTs are chronically elevated due to fatty liver disease -  We monitored her liver functions closely while on chemo, stable overall  -Will continue monitoring    7. Bone Health -I discussed that aromatase inhibitors can potentially weaken the bone. I encouraged her to be active and to take a Vit D and Ca supplement.    -She will find out when her last DEXA was, she will continue every 2 years   Follow-up: -She had Cycle 9 herceptin maintenance therapy today, will complete in Oct 2019 -Surveillance CT CAP W Contrast in 2 weeks, I will call with results  -Plan to start Letrozole in 1 week, called in to her pharmacy today  -Lab and f/u in 9 weeks, will schedule her for survivorship clinic at next visit  Orders Placed This Encounter  Procedures  . CT Abdomen Pelvis W Contrast    Standing Status:   Future    Standing Expiration Date:   12/16/2018    Order Specific Question:   If indicated for the ordered procedure, I authorize the administration of contrast media per Radiology protocol    Answer:   Yes    Order Specific Question:   Preferred imaging location?    Answer:   [facility]    Order Specific Question:   Radiology Contrast Protocol - do NOT remove file path    Answer:   \\[email or web address]  . CT Chest W Contrast    Standing Status:   Future    Standing Expiration Date:   12/16/2018    Order Specific Question:   If indicated for the ordered procedure, I authorize the administration of contrast media per Radiology protocol    Answer:   Yes    Order Specific Question:   Preferred imaging location?    Answer:   [facility], [place]    Order Specific Question:   Radiology Contrast Protocol - do NOT remove file path    Answer:   \\[email or web address]    All questions were answered. The patient knows to call the clinic with any problems, questions or concerns. I spent 20 minutes counseling the patient face to face. The total time spent in the appointment was 25 minutes and more than 50% was on counseling.   This document serves as a record of services personally performed by Truitt Merle, MD. It was created on her behalf by Theresia Bough, a trained medical scribe. The creation of this record is based on the scribe's personal observations and the  provider's statements to them.   I have reviewed the above documentation for accuracy and completeness, and I agree with the above.   Truitt Merle  12/16/2017 2:45 PM

## 2017-12-16 ENCOUNTER — Inpatient Hospital Stay: Payer: Medicare Other

## 2017-12-16 ENCOUNTER — Telehealth: Payer: Self-pay

## 2017-12-16 ENCOUNTER — Inpatient Hospital Stay (HOSPITAL_BASED_OUTPATIENT_CLINIC_OR_DEPARTMENT_OTHER): Payer: Medicare Other | Admitting: Hematology

## 2017-12-16 ENCOUNTER — Encounter: Payer: Self-pay | Admitting: Hematology

## 2017-12-16 ENCOUNTER — Inpatient Hospital Stay: Payer: Medicare Other | Attending: Hematology

## 2017-12-16 VITALS — BP 128/76 | HR 74 | Resp 18 | Ht 63.0 in | Wt 190.3 lb

## 2017-12-16 VITALS — BP 128/76 | HR 74 | Temp 97.7°F | Resp 18

## 2017-12-16 DIAGNOSIS — I10 Essential (primary) hypertension: Secondary | ICD-10-CM | POA: Insufficient documentation

## 2017-12-16 DIAGNOSIS — C50211 Malignant neoplasm of upper-inner quadrant of right female breast: Secondary | ICD-10-CM | POA: Insufficient documentation

## 2017-12-16 DIAGNOSIS — Z79811 Long term (current) use of aromatase inhibitors: Secondary | ICD-10-CM | POA: Diagnosis not present

## 2017-12-16 DIAGNOSIS — M810 Age-related osteoporosis without current pathological fracture: Secondary | ICD-10-CM

## 2017-12-16 DIAGNOSIS — Z5112 Encounter for antineoplastic immunotherapy: Secondary | ICD-10-CM | POA: Diagnosis not present

## 2017-12-16 DIAGNOSIS — C779 Secondary and unspecified malignant neoplasm of lymph node, unspecified: Secondary | ICD-10-CM | POA: Diagnosis not present

## 2017-12-16 DIAGNOSIS — Z170 Estrogen receptor positive status [ER+]: Secondary | ICD-10-CM

## 2017-12-16 DIAGNOSIS — C541 Malignant neoplasm of endometrium: Secondary | ICD-10-CM | POA: Diagnosis not present

## 2017-12-16 DIAGNOSIS — Z95828 Presence of other vascular implants and grafts: Secondary | ICD-10-CM

## 2017-12-16 DIAGNOSIS — Z923 Personal history of irradiation: Secondary | ICD-10-CM

## 2017-12-16 DIAGNOSIS — Z79899 Other long term (current) drug therapy: Secondary | ICD-10-CM | POA: Insufficient documentation

## 2017-12-16 LAB — COMPREHENSIVE METABOLIC PANEL
ALT: 138 U/L — AB (ref 0–55)
AST: 80 U/L — AB (ref 5–34)
Albumin: 3.9 g/dL (ref 3.5–5.0)
Alkaline Phosphatase: 57 U/L (ref 40–150)
Anion gap: 8 (ref 3–11)
BUN: 9 mg/dL (ref 7–26)
CHLORIDE: 100 mmol/L (ref 98–109)
CO2: 27 mmol/L (ref 22–29)
Calcium: 9.5 mg/dL (ref 8.4–10.4)
Creatinine, Ser: 0.75 mg/dL (ref 0.60–1.10)
GFR calc Af Amer: >60 mL/min (ref >60)
GLUCOSE: 109 mg/dL (ref 70–140)
POTASSIUM: 3.5 mmol/L (ref 3.5–5.1)
Sodium: 135 mmol/L — ABNORMAL LOW (ref 136–145)
Total Bilirubin: 0.5 mg/dL (ref 0.2–1.2)
Total Protein: 7.3 g/dL (ref 6.4–8.3)

## 2017-12-16 LAB — CBC WITH DIFFERENTIAL/PLATELET
BASOS ABS: 0 10*3/uL (ref 0.0–0.1)
Basophils Relative: 1 %
Eosinophils Absolute: 0.1 10*3/uL (ref 0.0–0.5)
Eosinophils Relative: 2 %
HEMATOCRIT: 38.7 % (ref 34.8–46.6)
Hemoglobin: 13.2 g/dL (ref 11.6–15.9)
LYMPHS PCT: 35 %
Lymphs Abs: 1.1 10*3/uL (ref 0.9–3.3)
MCH: 36.3 pg — ABNORMAL HIGH (ref 25.1–34.0)
MCHC: 34.2 g/dL (ref 31.5–36.0)
MCV: 106.3 fL — AB (ref 79.5–101.0)
Monocytes Absolute: 0.5 10*3/uL (ref 0.1–0.9)
Monocytes Relative: 15 %
Neutro Abs: 1.5 10*3/uL (ref 1.5–6.5)
Neutrophils Relative %: 47 %
PLATELETS: 122 10*3/uL — AB (ref 145–400)
RBC: 3.64 MIL/uL — AB (ref 3.70–5.45)
RDW: 12.4 % (ref 11.2–14.5)
WBC: 3 10*3/uL — AB (ref 3.9–10.3)

## 2017-12-16 MED ORDER — ACETAMINOPHEN 325 MG PO TABS
650.0000 mg | ORAL_TABLET | Freq: Once | ORAL | Status: AC
  Administered 2017-12-16: 650 mg via ORAL

## 2017-12-16 MED ORDER — ACETAMINOPHEN 325 MG PO TABS
ORAL_TABLET | ORAL | Status: AC
  Filled 2017-12-16: qty 2

## 2017-12-16 MED ORDER — LETROZOLE 2.5 MG PO TABS: 2.5000 mg | ORAL_TABLET | Freq: Every day | ORAL | 2 refills | Status: DC

## 2017-12-16 MED ORDER — DIPHENHYDRAMINE HCL 25 MG PO CAPS
50.0000 mg | ORAL_CAPSULE | Freq: Once | ORAL | Status: AC
  Administered 2017-12-16: 50 mg via ORAL

## 2017-12-16 MED ORDER — DIPHENHYDRAMINE HCL 25 MG PO CAPS
ORAL_CAPSULE | ORAL | Status: AC
  Filled 2017-12-16: qty 2

## 2017-12-16 MED ORDER — SODIUM CHLORIDE 0.9% FLUSH
10.0000 mL | INTRAVENOUS | Status: DC | PRN
  Administered 2017-12-16: 10 mL
  Filled 2017-12-16: qty 10

## 2017-12-16 MED ORDER — SODIUM CHLORIDE 0.9 % IV SOLN
6.0000 mg/kg | Freq: Once | INTRAVENOUS | Status: AC
  Administered 2017-12-16: 525 mg via INTRAVENOUS
  Filled 2017-12-16: qty 25

## 2017-12-16 MED ORDER — HEPARIN SOD (PORK) LOCK FLUSH 100 UNIT/ML IV SOLN
500.0000 [IU] | Freq: Once | INTRAVENOUS | Status: AC | PRN
  Administered 2017-12-16: 500 [IU]
  Filled 2017-12-16: qty 5

## 2017-12-16 MED ORDER — SODIUM CHLORIDE 0.9% FLUSH
10.0000 mL | INTRAVENOUS | Status: DC | PRN
  Administered 2017-12-16: 10 mL via INTRAVENOUS
  Filled 2017-12-16: qty 10

## 2017-12-16 MED ORDER — SODIUM CHLORIDE 0.9 % IV SOLN
Freq: Once | INTRAVENOUS | Status: AC
  Administered 2017-12-16: 10:00:00 via INTRAVENOUS

## 2017-12-16 MED FILL — LETROZOLE 2.5 MG TABLET: 2.5 | 30 days supply | Qty: 30 | Fill #0

## 2017-12-16 NOTE — Patient Instructions (Signed)
[facility] Discharge Instructions for Patients Receiving Chemotherapy  Today you received the following chemotherapy agents Herceptin  To help prevent nausea and vomiting after your treatment, we encourage you to take your nausea medication as directed   If you develop nausea and vomiting that is not controlled by your nausea medication, call the clinic.   BELOW ARE SYMPTOMS THAT SHOULD BE REPORTED IMMEDIATELY:  *FEVER GREATER THAN 100.5 F  *CHILLS WITH OR WITHOUT FEVER  NAUSEA AND VOMITING THAT IS NOT CONTROLLED WITH YOUR NAUSEA MEDICATION  *UNUSUAL SHORTNESS OF BREATH  *UNUSUAL BRUISING OR BLEEDING  TENDERNESS IN MOUTH AND THROAT WITH OR WITHOUT PRESENCE OF ULCERS  *URINARY PROBLEMS  *BOWEL PROBLEMS  UNUSUAL RASH Items with * indicate a potential emergency and should be followed up as soon as possible.  Feel free to call the clinic should you have any questions or concerns. The clinic phone number is [phone].  Please show the CHEMO ALERT CARD at check-in to the Emergency Department and triage nurse.   

## 2017-12-16 NOTE — Telephone Encounter (Signed)
Printed avs and calender of upcoming appointment. 4/3 los

## 2017-12-22 DIAGNOSIS — H25811 Combined forms of age-related cataract, right eye: Secondary | ICD-10-CM | POA: Diagnosis not present

## 2017-12-22 DIAGNOSIS — H1851 Endothelial corneal dystrophy: Secondary | ICD-10-CM | POA: Diagnosis not present

## 2017-12-22 DIAGNOSIS — H268 Other specified cataract: Secondary | ICD-10-CM | POA: Diagnosis not present

## 2017-12-24 MED FILL — OLMESARTAN-HCTZ 20-12.5 MG: 20-12.5 | 30 days supply | Qty: 30 | Fill #0

## 2017-12-30 ENCOUNTER — Ambulatory Visit (HOSPITAL_COMMUNITY)
Admission: RE | Admit: 2017-12-30 | Discharge: 2017-12-30 | Disposition: A | Discharge status: Home / Self Care | Payer: Medicare Other | Source: Ambulatory Visit | Attending: Hematology | Admitting: Hematology

## 2017-12-30 ENCOUNTER — Encounter (HOSPITAL_COMMUNITY): Payer: Self-pay

## 2017-12-30 ENCOUNTER — Other Ambulatory Visit: Payer: Self-pay | Admitting: Hematology

## 2017-12-30 DIAGNOSIS — Z5111 Encounter for antineoplastic chemotherapy: Secondary | ICD-10-CM | POA: Diagnosis not present

## 2017-12-30 DIAGNOSIS — C541 Malignant neoplasm of endometrium: Secondary | ICD-10-CM

## 2017-12-30 DIAGNOSIS — R590 Localized enlarged lymph nodes: Secondary | ICD-10-CM | POA: Insufficient documentation

## 2017-12-30 DIAGNOSIS — Z90710 Acquired absence of both cervix and uterus: Secondary | ICD-10-CM | POA: Insufficient documentation

## 2017-12-30 DIAGNOSIS — K760 Fatty (change of) liver, not elsewhere classified: Secondary | ICD-10-CM | POA: Diagnosis not present

## 2017-12-30 DIAGNOSIS — C50911 Malignant neoplasm of unspecified site of right female breast: Secondary | ICD-10-CM | POA: Diagnosis not present

## 2017-12-30 DIAGNOSIS — R1907 Generalized intra-abdominal and pelvic swelling, mass and lump: Secondary | ICD-10-CM

## 2017-12-30 MED ORDER — HEPARIN SOD (PORK) LOCK FLUSH 100 UNIT/ML IV SOLN
INTRAVENOUS | Status: AC
  Filled 2017-12-30: qty 5

## 2017-12-30 MED ORDER — HEPARIN SOD (PORK) LOCK FLUSH 100 UNIT/ML IV SOLN
500.0000 [IU] | Freq: Once | INTRAVENOUS | Status: AC
  Administered 2017-12-30: 500 [IU] via INTRAVENOUS

## 2017-12-30 MED ORDER — IOHEXOL 300 MG/ML  SOLN
100.0000 mL | Freq: Once | INTRAMUSCULAR | Status: AC | PRN
  Administered 2017-12-30: 100 mL via INTRAVENOUS

## 2018-01-04 ENCOUNTER — Telehealth: Payer: Self-pay | Admitting: Hematology

## 2018-01-04 ENCOUNTER — Telehealth: Payer: Self-pay

## 2018-01-04 NOTE — Telephone Encounter (Signed)
I called pt back, she wants to see GYN oncologist Dr. Fay Records at [facility], I will ask HIM to refer her.  KIm, please help to send her records over, thanks   Truitt Merle MD

## 2018-01-04 NOTE — Telephone Encounter (Signed)
Spoke to patient regarding voicemail left earlier. Patient requested an office visit to discuss Ct scan results.

## 2018-01-04 NOTE — Telephone Encounter (Signed)
Patient called with request to have referral to Dr. Trudi Ida at [facility]. His fax is [phone] for referral to be sent. Patient has appointment in the morning at 8:15 and plans to keep this appointment.

## 2018-01-05 ENCOUNTER — Inpatient Hospital Stay (HOSPITAL_BASED_OUTPATIENT_CLINIC_OR_DEPARTMENT_OTHER): Payer: Medicare Other | Admitting: Hematology

## 2018-01-05 ENCOUNTER — Telehealth: Payer: Self-pay | Admitting: *Deleted

## 2018-01-05 ENCOUNTER — Encounter: Payer: Self-pay | Admitting: Hematology

## 2018-01-05 ENCOUNTER — Telehealth: Payer: Self-pay | Admitting: Hematology

## 2018-01-05 VITALS — BP 145/72 | HR 91 | Temp 98.6°F | Resp 18 | Ht 63.0 in | Wt 188.5 lb

## 2018-01-05 DIAGNOSIS — Z170 Estrogen receptor positive status [ER+]: Secondary | ICD-10-CM | POA: Diagnosis not present

## 2018-01-05 DIAGNOSIS — I10 Essential (primary) hypertension: Secondary | ICD-10-CM | POA: Diagnosis not present

## 2018-01-05 DIAGNOSIS — Z5112 Encounter for antineoplastic immunotherapy: Secondary | ICD-10-CM | POA: Diagnosis not present

## 2018-01-05 DIAGNOSIS — C541 Malignant neoplasm of endometrium: Secondary | ICD-10-CM | POA: Diagnosis not present

## 2018-01-05 DIAGNOSIS — C50211 Malignant neoplasm of upper-inner quadrant of right female breast: Secondary | ICD-10-CM

## 2018-01-05 DIAGNOSIS — M810 Age-related osteoporosis without current pathological fracture: Secondary | ICD-10-CM

## 2018-01-05 DIAGNOSIS — C775 Secondary and unspecified malignant neoplasm of intrapelvic lymph nodes: Secondary | ICD-10-CM | POA: Diagnosis not present

## 2018-01-05 DIAGNOSIS — Z923 Personal history of irradiation: Secondary | ICD-10-CM

## 2018-01-05 DIAGNOSIS — C779 Secondary and unspecified malignant neoplasm of lymph node, unspecified: Secondary | ICD-10-CM | POA: Diagnosis not present

## 2018-01-05 MED FILL — MOXIFLOXACIN 0.5% EYE DROPS: 0.5 | 15 days supply | Qty: 3 | Fill #0

## 2018-01-05 MED FILL — PREDNISOLONE AC 1% EYE DROP: 1 | 50 days supply | Qty: 10 | Fill #0

## 2018-01-05 MED FILL — PROLENSA 0.07% EYE DROPS: 0.07 | 3 days supply | Qty: 3 | Fill #0

## 2018-01-05 NOTE — Progress Notes (Signed)
[place]  Telephone:(336) [phone] [phone]  Clinic Follow up Note   Patient Care Team: [name], MD as PCP - Haskell Riling, MD as Consulting Physician (General Surgery) Truitt Merle, MD as Consulting Physician (Hematology) [name], MD as Attending Physician (Radiation Oncology)   Date of Service:  01/05/2018  CHIEF COMPLAINTS:  Follow up right breast cancer and endometrial cancer     Malignant neoplasm of upper-inner quadrant of right breast in female, estrogen receptor positive ([place])   04/27/2017 Initial Biopsy    Diagnosis Breast, right, needle core biopsy INVASIVE DUCTAL CARCINOMA, GRADE 2 Microscopic Comment The neoplasm has intracellular mucin and signet ring cell features. Immunostains shows these cells are positive for ER,GATA3, ck7 and GCDFP, negative for ck20, cdx2, TTF-1 and pax8, The immunostaining pattern supports the neoplasm is breast primary.      04/27/2017 Receptors her2    Estrogen Receptor: 70%, POSITIVE, MODERATE STAINING INTENSITY Progesterone Receptor: 0%, NEGATIVE Proliferation Marker Ki67: 30% HER2 - **POSITIVE** RATIO OF HER2/CEP17 SIGNALS 2.91 AVERAGE HER2 COPY NUMBER PER CELL 7.28      04/27/2017 Initial Diagnosis    Malignant neoplasm of upper-inner quadrant of right breast in female, estrogen receptor positive ([place])      05/07/2017 Imaging    CT Chest W Contrast 05/07/17 IMPRESSION: Tiny well-defined fatty lesion on the pleura at the right lung base. The thinner slice collimation used for today's chest CT eliminates volume-averaging seen in the lesion on the prior exam and confirms that this is a diffusely fatty nodule. This is a benign finding and likely represents a tiny lipoma. No defect in the hemidiaphragm evident to suggest tiny diaphragmatic hernia. Pulmonary hamartoma a consideration although the lack of soft tissue components makes this less likely.      05/15/2017 Genetic  Testing    Patient had genetic testing due to a personal history of breast cancer and uterine cancer as well as a family history of cancer. The Multi-Cancer panel was ordered. The Multi-Cancer Panel offered by [facility] includes sequencing and/or deletion duplication testing of the following 83 genes: ALK, APC, ATM, AXIN2,BAP1,  BARD1, BLM, BMPR1A, BRCA1, BRCA2, BRIP1, CASR, CDC73, CDH1, CDK4, CDKN1B, CDKN1C, CDKN2A (p14ARF), CDKN2A (p16INK4a), CEBPA, CHEK2, CTNNA1, DICER1, DIS3L2, EGFR (c.2369C>T, p.Thr790Met variant only), EPCAM (Deletion/duplication testing only), FH, FLCN, GATA2, GPC3, GREM1 (Promoter region deletion/duplication testing only), HOXB13 (c.251G>A, p.Gly84Glu), HRAS, KIT, MAX, MEN1, MET, MITF (c.952G>A, p.Glu318Lys variant only), MLH1, MSH2, MSH3, MSH6, MUTYH, NBN, NF1, NF2, NTHL1, PALB2, PDGFRA, PHOX2B, PMS2, POLD1, POLE, POT1, PRKAR1A, PTCH1, PTEN, RAD50, RAD51C, RAD51D, RB1, RECQL4, RET, RUNX1, SDHAF2, SDHA (sequence changes only), SDHB, SDHC, SDHD, SMAD4, SMARCA4, SMARCB1, SMARCE1, STK11, SUFU, TERC, TERT, TMEM127, TP53, TSC1, TSC2, VHL, WRN and WT1.   Results: No pathogenic mutations were identified. A VUS in the GATA2 gene c.1348G>A (p.Gly450Arg) was identified.  The date of this test report is 05/25/2017.        05/25/2017 Surgery    RIGHT BREAST LUMPECTOMY WITH RADIOACTIVE SEED AND  RIGHT AXILLARY SENTINEL LYMPH NODE BIOPSY and Pot placement by Dr. Excell Seltzer 05/25/17      05/25/2017 Pathology Results    Diagnosis 05/25/17 1. Breast, lumpectomy, right - INVASIVE DUCTAL CARCINOMA, GRADE II/III, SPANNING 2.2 CM. - DUCTAL CARCINOMA IN SITU, HIGH GRADE. - THE SURGICAL RESECTION MARGINS ARE NEGATIVE FOR CARCINOMA. - SEE ONCOLOGY TABLE BELOW. 2. Breast, excision, right additional superior margin - BENIGN FIBROADIPOSE TISSUE. - BENIGN SKELETAL MUSCLE. - SEE COMMENT. 3. Breast, excision, right additional lateral  margin - BENIGN BREAST PARENCHYMA. - THERE IS NO EVIDENCE OF  MALIGNANCY. - SEE COMMENT. 4. Breast, excision, chest wall margin - BENIGN FIBROADIPOSE TISSUE. - THERE IS NO EVIDENCE OF MALIGNANCY. - SEE COMMENT. 5. Lymph node, sentinel, biopsy, right axillary - THERE IS NO EVIDENCE OF CARCINOMA IN 1 OF 1 LYMPH NODE (0/1). 6. Lymph node, sentinel, biopsy, right axillary - THERE IS NO EVIDENCE OF CARCINOMA IN 1 OF 1 LYMPH NODE (0/1). 7. Lymph node, sentinel, biopsy, right axillary - THERE IS NO EVIDENCE OF CARCINOMA IN 1 OF 1 LYMPH NODE (0/1).      06/26/2017 PET scan    PET  IMPRESSION: 1. Postoperative findings both in the right breast and in the anatomic pelvis, with associated low-grade activity considered to be postoperative in nature. No hypermetabolic adenopathy or hypermetabolic lesions are identified to suggest active metastatic disease/malignancy. 2. Other imaging findings of potential clinical significance: Aortic Atherosclerosis (ICD10-I70.0). Sigmoid colon diverticulosis. Biapical pleuroparenchymal scarring in the lungs. Small amount of free pelvic fluid in the cul-de-sac, likely postoperative.       07/01/2017 - 10/14/2017 Chemotherapy    Adjuvant TCH with Onpro every 3 weeks for 6 cycles starting on 07/01/17, Changed Taxol to abraxane with cycle 4 due to drug rash reaction, followed by Herceptin every 3 weeks for 6 months       11/01/2017 - 12/08/2017 Radiation Therapy    Radiation therapy to her right breast with [name]      11/04/2017 -  Chemotherapy    Maintenance Herceptin starting 11/04/17 and will comeplete her 12 months in 06/2018       12/23/2017 -  Anti-estrogen oral therapy    Adjuvant Letrozole 2.5 mg daily       12/30/2017 Imaging    CT CAP W Contrast 12/30/17 IMPRESSION: Increased size of 1.1 cm aorto-caval retroperitoneal lymph node. This could be reactive due to interval hysterectomy, however metastatic carcinoma cannot be excluded. Consider continued attention on short-term follow-up CT, or PET-CT  scan for further evaluation.  Mild hepatic steatosis.       Endometrial adenocarcinoma ([place])   05/07/2017 Initial Diagnosis    Endometrial adenocarcinoma ([place])      06/09/2017 Surgery    XI ROBOTIC ASSISTED TOTAL LAPOROSCOPIC HYSTERECTOMY WITH BILATERAL SALPINGO OOPHORECTOMY and SENTINEL NODE BIOPSY by Dr. Denman George 06/09/17      06/09/2017 Pathology Results    Diagnosis 06/09/17 1. Lymph node, sentinel, biopsy, right external iliac - METASTATIC ADENOCARCINOMA IN ONE LYMPH NODE (1/1). 2. Lymph node, sentinel, biopsy, left obturator - ONE BENIGN LYMPH NODE (0/1). 3. Lymph node, sentinel, biopsy, left external iliac - METASTATIC ADENOCARCINOMA IN ONE LYMPH NODE (1/1). 4. Uterus +/- tubes/ovaries, neoplastic ENDOMETRIUM: - ENDOMETRIAL ADENOCARCINOMA, 3.2 CM. - CARCINOMA INVADES INNER HALF OF MYOMETRIUM. 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 - CERVIX, BILATERAL FALLOPIAN TUBES AND BILATERAL OVARIES FREE OF TUMOR      08/18/2017 - 09/17/2017 Radiation Therapy    vaginal brachytherapy per [name] on starting 08/18/17       HISTORY OF PRESENTING ILLNESS (05/06/2017):  Dawn [name] 67 y.o. female is here because of newly diagnosed breast cancer. Screening mammogram detected right breast mass on 04/20/2017. Ultrasound showed 1.5 cm mass in the 1:00 position. The axilla was negative on ultrasound. Biopsy of the right breast was performed on 04/27/2017 revealing invasive ductal carcinoma, Grade 2, ER 70% / PR 0% / HER-2+ / Ki-67 30%.   Of note, she was also recently diagnosed with endometrial cancer.  Biopsy of curettage endometrium on 04/29/2017 revealed adenocarcinoma. Immunohistology shows the tumor is positive with estrogen receptor, progesterone receptor with patchy positivity with carcinoembryonic antigen and p16. The tumor is negative with CD10 and vimentin. The morphology and immunophenotype are not definitive as to endocervical or endometrial origin. There are rare fragments with  endometrial type stroma, which may indicate an endometrial origin although this is not definitive as to origin.  The patient presents today in our multidisciplinary breast clinic. She is doing well overall. She went to her PCP with abdominal pain and vaginal bleeding which felt like she was having her period all the time. Then she presented to the ER with severe vertigo and heavy vaginal bleeding. She has been having heavy vaginal bleeding since mid-July. She does not have a history of anemia and associates it with recent bleeding. She has been taking oral iron. She took an aspirin daily but, has not been taking it lately. She had been taking Evista for one year and stopped taking it on her own. She was taking this medication for her bone density. She takes 2-3 Tylenol daily to manage the abdominal pain / cramping discomfort. She believes this pain is associated with passing clots. She takes a multivitamin daily. The patient is apprehensive about chemotherapy and is unsure if she would like to proceed with chemotherapy if it is recommended. She reports leg cramping. She does not take potassium supplements but, does try to eat bananas. States liver function is typically elevated and has been for about the last 2-3 years.   She has never smoked and doesn't drink alcohol. She did grow up with heavy smoking parents.   Father had colon cancer diagnosed at 66 years-old, and he died of bladder cancer. Paternal grandmother with some type of gynecologic cancer, age of onset in her mid-46s. Patient believes there was more cancer in her family in aunts and uncles however, there are no more living family members to discuss this with.   GYN HISTORY  Menarchal: 16 LMP: age of 17 Contraceptive: none  HRT: none  G2P2: She was 26 when she gave first live births.   CURRENT THERAPY:  -Maintenance Herceptin starting 11/04/17 and will complete 12 months in 06/2018 -Adjuvant letrozole 2.5 mg daily started  12/23/17  INTERVAL HISTORY:  Dawn [name] is here for a follow up. She presents to the clinic today accompanied by her husband. She notes she is ready for her referral to a new GYN for management of her endometrial cancer. She notes she a increased size of a lymph node seen on her latest CT scan. She notes she is very nervous about her still having cancer, post treatment. She notes she will see [name] next week for follow up. She notes she has started letrozole and is currently tolerating well. She notes she plans to go back to work May 7th and need paperwork filled out to clear her. She notes she had her right cataract surgery and will be having her left eye done soon.    On review of symptoms, pt notes IBS will cause her occasional abdominal pain but this is tolerable. 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 She still takes B complex.    MEDICAL HISTORY:  Past Medical History:  Diagnosis Date  . Anemia    as a child.  . Arthritis   . Bilateral cataracts   . Bilateral leg cramps   . Cancer ([place])    skin  .  Dyspnea   . Family history of bladder cancer   . Family history of colon cancer in father   . Fuchs' corneal dystrophy   . GERD (gastroesophageal reflux disease)   . Hearing loss    Right side 30%  . History of hiatal hernia    small size  . History of radiation therapy 08/21/17, 08/28/17,09/01/17, 09/11/17, 09/17/17   Vaginal cuff brachytherapy.   . Hyperlipidemia   . Hypertension   . IBS (irritable bowel syndrome)    hx of  . Malignant neoplasm of upper-inner quadrant of right female breast ([place])   . NAFL (nonalcoholic fatty liver)   . Obesity   . PONV (postoperative nausea and vomiting)   . 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 Uterine cancer ([place])    endometrial cancer  . Varices, gastric   . Vertigo     SURGICAL HISTORY: Past Surgical History:  Procedure Laterality Date  . BREAST BIOPSY Right  04/27/2017  . BREAST LUMPECTOMY WITH RADIOACTIVE SEED AND SENTINEL LYMPH NODE BIOPSY Right 05/25/2017   Procedure: RIGHT BREAST LUMPECTOMY WITH RADIOACTIVE SEED AND  RIGHT AXILLARY SENTINEL LYMPH NODE BIOPSY;  Surgeon: Excell Seltzer, MD;  Location: [facility];  Service: General;  Laterality: Right;  . COLONOSCOPY    . HYSTEROSCOPY W/D&C N/A 04/29/2017   Procedure: DILATATION AND CURETTAGE /HYSTEROSCOPY;  Surgeon: Linda Hedges, DO;  Location: [facility];  Service: Gynecology;  Laterality: N/A;  . PORTACATH PLACEMENT Left 05/25/2017   Procedure: INSERTION PORT-A-CATH;  Surgeon: Excell Seltzer, MD;  Location: [facility];  Service: General;  Laterality: Left;  . ROBOTIC ASSISTED TOTAL HYSTERECTOMY WITH BILATERAL SALPINGO OOPHERECTOMY N/A 06/09/2017   Procedure: XI ROBOTIC ASSISTED TOTAL LAPOROSCOPIC HYSTERECTOMY WITH BILATERAL SALPINGO OOPHORECTOMY;  Surgeon: Everitt Amber, MD;  Location: [facility];  Service: Gynecology;  Laterality: N/A;  . SENTINEL NODE BIOPSY N/A 06/09/2017   Procedure: SENTINEL NODE BIOPSY;  Surgeon: Everitt Amber, MD;  Location: [facility];  Service: Gynecology;  Laterality: N/A;    SOCIAL HISTORY: Social History   Socioeconomic History  . Marital status: Married    Spouse name: Not on file  . Number of children: Not on file  . Years of education: Not on file  . Highest education level: Not on file  Occupational History    Employer: [employer]    Comment: Nurse, mental health research   Social Needs  . Financial resource strain: Not on file  . Food insecurity:    Worry: Not on file    Inability: Not on file  . Transportation needs:    Medical: Not on file    Non-medical: Not on file  Tobacco Use  . Smoking status: Never Smoker  . Smokeless tobacco: Never Used  Substance and Sexual Activity  . Alcohol use: Yes    Alcohol/week: 0.0 oz    Comment: <1/week wine or beer occasional  . Drug use: No  . Sexual activity: Yes    Birth  control/protection: Post-menopausal  Lifestyle  . Physical activity:    Days per week: Not on file    Minutes per session: Not on file  . Stress: Not on file  Relationships  . Social connections:    Talks on phone: Not on file    Gets together: Not on file    Attends religious service: Not on file    Active member of club or organization: Not on file    Attends meetings  of clubs or organizations: Not on file    Relationship status: Not on file  . Intimate partner violence:    Fear of current or ex partner: Not on file    Emotionally abused: Not on file    Physically abused: Not on file    Forced sexual activity: Not on file  Other Topics Concern  . Not on file  Social History Narrative   2-3 caffeine drinks per day. Regular exercise.  Walking 3 miles a day.     FAMILY HISTORY: Family History  Problem Relation Age of Onset  . Colon cancer Father 30  . Heart attack Father 93  . Prostate cancer Father        dx 69's  . Bladder Cancer Father 68  . Stroke Mother 55  . Diabetes Maternal Grandfather   . Kidney disease Maternal Grandfather   . Asthma Brother   . Cancer Paternal Grandmother 14       GYN cancer ( thinks ovarian, maybe uterine)  . Heart attack Maternal Grandmother 70  . Breast cancer Other 46  . Colon cancer Other 26  . Cancer Other        type unk, age dx unk    ALLERGIES:  is allergic to ciprofloxacin; crestor [rosuvastatin calcium]; fosamax [alendronate sodium]; penicillins; and taxol [paclitaxel].  MEDICATIONS:  Current Outpatient Medications  Medication Sig Dispense Refill  . acetaminophen (TYLENOL) 325 MG tablet Take 650 mg by mouth every 6 (six) hours as needed for mild pain or moderate pain.    . Ascorbic Acid (VITAMIN C) 1000 MG tablet Take 1,000 mg by mouth daily.    Marland Kitchen ibandronate (BONIVA) 150 MG tablet Take 1 tablet (150 mg total) by mouth every 30 (thirty) days. Take in the morning with a full glass of water, on an empty stomach, and do not take  anything else by mouth or lie down for the next 30 min. 3 tablet 3  . letrozole (FEMARA) 2.5 MG tablet Take 1 tablet (2.5 mg total) by mouth daily. 30 tablet 2  . meclizine (ANTIVERT) 25 MG tablet Take 1-2 tablets (25-50 mg total) by mouth 3 (three) times daily as needed for dizziness. (Patient taking differently: Take 25-50 mg by mouth 3 (three) times daily as needed for dizziness (depends on dizziness if takes 1-2 tablets). ) 30 tablet 0  . Multiple Vitamin (MULTIVITAMIN) capsule Take 1 capsule by mouth daily.      Marland Kitchen olmesartan-hydrochlorothiazide (BENICAR HCT) 20-12.5 MG tablet Take 1 tablet by mouth daily. 90 tablet 2  . Pitavastatin Calcium (LIVALO) 2 MG TABS Take 3 mg by mouth every evening. Takes 1.5 tablet    . prochlorperazine (COMPAZINE) 10 MG tablet Take 1 tablet (10 mg total) by mouth every 6 (six) hours as needed (Nausea or vomiting). 30 tablet 1  . sodium chloride (MURO 128) 5 % ophthalmic ointment Place 1 application into both eyes 2 (two) times daily.      No current facility-administered medications for this visit.     REVIEW OF SYSTEMS:   Constitutional: Denies fevers, chills or abnormal night sweats  (+) complete hair loss   Eyes: Denies blurriness of vision, double vision or watery eyes Ears, nose, mouth, throat, and face: Denies mucositis or sore throat Respiratory: Denies cough, dyspnea or wheezes Cardiovascular: Denies palpitation, chest discomfort or lower extremity swelling Gastrointestinal:  Denies nausea, vomiting, constipation, diarrhea, heartburn or change in bowel habits (+) Occasional abdominal pain, tolerable.  Skin: Denies abnormal skin rashes.  Lymphatics: Denies new lymphadenopathy or easy bruising Musculoskeletal: Negative Neurological:Denies numbness, new weaknesses (+) vertigo (+) neuropathy in fingers and feet Behavioral/Psych: Mood is stable, no new changes  All other systems were reviewed with the patient and are negative.  PHYSICAL EXAMINATION:    ECOG PERFORMANCE STATUS: 1 - Symptomatic but completely ambulatory  Vitals:   01/05/18 0830  BP: (!) 145/72  Pulse: 91  Resp: 18  Temp: 98.6 F (37 C)  SpO2: 97%   Filed Weights   01/05/18 0830  Weight: 188 lb 8 oz (85.5 kg)     GENERAL:alert, no distress and comfortable SKIN: skin color, texture, turgor are normal, no rashes or significant lesions EYES: normal, conjunctiva are pink and non-injected, sclera clear OROPHARYNX:no exudate, no erythema and lips, buccal mucosa, and tongue normal  NECK: supple, thyroid normal size, non-tender, without nodularity LYMPH:  no palpable lymphadenopathy in the cervical, axillary or inguinal LUNGS: clear to auscultation with normal breathing effort HEART: regular rate & rhythm and no lower extremity edema (+) heart murmur in the aortic valve ABDOMEN:abdomen soft, non-tender and normal bowel sounds (+) s/p total hysterectomy and salpingo-oophorectomy, 4 horizontal laparoscopic surgical incisions at the level of the umbilicus, healing well  Musculoskeletal:no cyanosis of digits and no clubbing  PSYCH: alert & oriented x 3 with fluent speech NEURO: no focal motor/sensory deficits BREAST: Left breast shows no palpable mass or adenopathy. Right breast shows no palpable mass or adenopathy. (+) S/p right lumpectomy and radiation, Her surgical incision in right axilla and around the areola healed well without skin erythema or discharge. She has diffuse skin erythema over the right breast secondary to radiation   LABORATORY DATA:  I have reviewed the data as listed CBC Latest Ref Rng & Units 12/16/2017 11/25/2017 11/04/2017  WBC 3.9 - 10.3 K/uL 3.0(L) 2.9(L) 4.1  Hemoglobin 11.6 - 15.9 g/dL 13.2 12.6 10.4(L)  Hematocrit 34.8 - 46.6 % 38.7 36.8 30.5(L)  Platelets 145 - 400 K/uL 122(L) 143(L) 120(L)    CMP Latest Ref Rng & Units 12/16/2017 11/25/2017 11/04/2017  Glucose 70 - 140 mg/dL 109 123 126  BUN 7 - 26 mg/dL '9 9 9  '$ Creatinine 0.60 - 1.10 mg/dL 0.75  0.77 0.76  Sodium 136 - 145 mmol/L 135(L) 135(L) 137  Potassium 3.5 - 5.1 mmol/L 3.5 3.6 3.4(L)  Chloride 98 - 109 mmol/L 100 99 101  CO2 22 - 29 mmol/L '27 27 27  '$ Calcium 8.4 - 10.4 mg/dL 9.5 9.5 9.4  Total Protein 6.4 - 8.3 g/dL 7.3 7.2 6.9  Total Bilirubin 0.2 - 1.2 mg/dL 0.5 0.5 0.4  Alkaline Phos 40 - 150 U/L 57 54 56  AST 5 - 34 U/L 80(H) 87(H) 85(H)  ALT 0 - 55 U/L 138(H) 145(H) 168(H)   PATHOLOGY FINDINGS:   Diagnosis 06/09/17 1. Lymph node, sentinel, biopsy, right external iliac - METASTATIC ADENOCARCINOMA IN ONE LYMPH NODE (1/1). 2. Lymph node, sentinel, biopsy, left obturator - ONE BENIGN LYMPH NODE (0/1). 3. Lymph node, sentinel, biopsy, left external iliac - METASTATIC ADENOCARCINOMA IN ONE LYMPH NODE (1/1). 4. Uterus +/- tubes/ovaries, neoplastic ENDOMETRIUM: - ENDOMETRIAL ADENOCARCINOMA, 3.2 CM. - CARCINOMA INVADES INNER HALF OF MYOMETRIUM. 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 - CERVIX, BILATERAL FALLOPIAN TUBES AND BILATERAL OVARIES FREE OF TUMOR. Microscopic Comment 4. ONCOLOGY TABLE-UTERUS, CARCINOMA OR CARCINOSARCOMA Specimen: Uterus with bilateral fallopian tubes and ovaries, right and left external iliac nodes and left obturator lymph node. Procedure: Hysterectomy with bilateral salpingo-oophorectomy and lymph node biopsies. Lymph node sampling  performed: Yes. Specimen integrity: Intact. Maximum tumor size: 3.2 cm. Histologic type: Mixed, endometrioid, clear cell and serous. Grade: III. Myometrial invasion: 0.8 cm where myometrium is 2.2 cm in thickness. Cervical stromal involvement: No. Extent of involvement of other organs: N/A. Lymph - vascular invasion: Present. Peritoneal washings: N/A. Lymph nodes: Examined: 3 Sentinel 1 of 3 FINAL for Dawn [name], Dawn [name] ([id number]) Microscopic Comment(continued) 0 Non-sentinel 3 Total Lymph nodes with metastasis: 2. Isolated tumor cells (< 0.2 mm): 0. Micrometastasis: (> 0.2 mm and < 2.0 mm):  0. Macrometastasis: (> 2.0 mm): 2. Extracapsular extension: No. TNM code: pT1a, pN1. FIGO Stage (based on pathologic findings, needs clinical correlation): IIIC1. Comments: The tumor is 3.2 cm in greatest dimension and invades the myometrium to a depth of 0.8 cm where the myometrium is 2.2 cm in thickness and there is lymphatic vascular involvement by tumor. 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 (JDP:ah 06/10/17)   Diagnosis 05/25/17 1. Breast, lumpectomy, right - INVASIVE DUCTAL CARCINOMA, GRADE II/III, SPANNING 2.2 CM. - DUCTAL CARCINOMA IN SITU, HIGH GRADE. - THE SURGICAL RESECTION MARGINS ARE NEGATIVE FOR CARCINOMA. - SEE ONCOLOGY TABLE BELOW. 2. Breast, excision, right additional superior margin - BENIGN FIBROADIPOSE TISSUE. - BENIGN SKELETAL MUSCLE. - SEE COMMENT. 3. Breast, excision, right additional lateral margin - BENIGN BREAST PARENCHYMA. - THERE IS NO EVIDENCE OF MALIGNANCY. - SEE COMMENT. 4. Breast, excision, chest wall margin - BENIGN FIBROADIPOSE TISSUE. - THERE IS NO EVIDENCE OF MALIGNANCY. - SEE COMMENT. 5. Lymph node, sentinel, biopsy, right axillary - THERE IS NO EVIDENCE OF CARCINOMA IN 1 OF 1 LYMPH NODE (0/1). 6. Lymph node, sentinel, biopsy, right axillary - THERE IS NO EVIDENCE OF CARCINOMA IN 1 OF 1 LYMPH NODE (0/1). 7. Lymph node, sentinel, biopsy, right axillary - THERE IS NO EVIDENCE OF CARCINOMA IN 1 OF 1 LYMPH NODE (0/1). Microscopic Comment 1. BREAST, INVASIVE TUMOR Procedure: Seed localized lumpectomy, additional superior, lateral, and chest wall margin resections and axillary lymph node resections. Laterality: Right. Tumor Size: 2.2 cm (gross measurement). Histologic Type: Ductal. Microscopic Comment(continued) Grade: II. Tubular Differentiation: 2. Nuclear Pleomorphism: 2. Mitotic Count: 2. Ductal Carcinoma in Situ (DCIS): Present, high grade. Extent of Tumor: Confined to breast parenchyma. Margins: Greater  than or equal to 0.2 cm to all margins. Regional Lymph Nodes: Number of Lymph Nodes Examined: 3. Number of Sentinel Lymph Nodes Examined: 3. Lymph Nodes with Macrometastases: 0. Lymph Nodes with Micrometastases: 0. Lymph Nodes with Isolated Tumor Cells: 0. Breast Prognostic Profile: Case [id number]. Estrogen Receptor: 70%, moderate. Progesterone Receptor: 0%. Her2: Amplification was detected. The ratio was 2.91. Ki-67: 30%. Best tumor block for sendout testing: 1A-1C. Pathologic Stage Classification (pTNM, AJCC 8th Edition): Primary Tumor (pT): pT2. Regional Lymph Nodes (pN): pN0. Distant Metastases (pM): pMX. 2. - 4. The surgical resection margin(s) of the specimen were inked and microscopically evaluated.  Diagnosis 04/29/2017 Endometrium, curettage - ADENOCARCINOMA. - SEE MICROSCOPIC DESCRIPTION. Microscopic Comment There are fragments of adenocarcinoma, some of which have cribriform architecture, and there is focal extracellular mucin. Immunohistochemistry shows the tumor is positive with estrogen receptor, progesterone receptor with patchy positivity with carcinoembryonic antigen and p16. The tumor is negative with CD10 and vimentin. The morphology and immunophenotype are not definitive as to endocervical or endometrial origin. There are rare fragments with endometrial type stroma, which may indicate an endometrial origin although this is not definitive as to origin.  Diagnosis 04/27/2017 Breast, right, needle core biopsy INVASIVE DUCTAL CARCINOMA, GRADE 2 Microscopic Comment  The neoplasm has intracellular mucin and signet ring cell features. Immunostains shows these cells are positive for ER, GATA3, ck7 and GCDFP, negative for ck20, cdx2, TTF-1 and pax8, The immunostaining pattern supports the neoplasm is breast primary. The Breast prognostic profile has been ordered. Results: IMMUNOHISTOCHEMICAL AND MORPHOMETRIC ANALYSIS PERFORMED MANUALLY Estrogen Receptor: 70%,  POSITIVE, MODERATE STAINING INTENSITY Progesterone Receptor: 0%, NEGATIVE Proliferation Marker Ki67: 30% Results: HER2 - **POSITIVE** RATIO OF HER2/CEP17 SIGNALS 2.91 AVERAGE HER2 COPY NUMBER PER CELL 7.28  GENETICS 05/07/17   PROCEDURES  ECHO 11/30/17 Impressions: - Normal LV size and systolic function, EF 97-35%. Strain as above.   Normal RV size and systolic function. No significant valvular   abnormalities.  ECHO 08/27/17  Impressions: - Normal LV size and systolic function, EF 32-99%. Strain as above.   Mildly dilated RV with normal systolic function.  ECHO 05/07/17 Impressions: - Normal LV size with mild LV hypertrophy. EF 65-70%, vigorous   systolic function. Strain as above. Normal RV size and systolic   function. No significant valvular abnormalities.  RADIOGRAPHIC STUDIES: I have personally reviewed the radiological images as listed and agreed with the findings in the report. Ct Chest W Contrast  Result Date: 12/30/2017 CLINICAL DATA:  Follow-up endometrial carcinoma and right breast carcinoma. Ongoing immunotherapy. Previous surgery, chemotherapy, and radiation therapy. EXAM: CT CHEST, ABDOMEN, AND PELVIS WITH CONTRAST TECHNIQUE: Multidetector CT imaging of the chest, abdomen and pelvis was performed following the standard protocol during bolus administration of intravenous contrast. CONTRAST:  171m OMNIPAQUE IOHEXOL 300 MG/ML  SOLN COMPARISON:  Chest CT on 05/07/2017, and AP CT on 04/03/2017 FINDINGS: CT CHEST FINDINGS Cardiovascular: No acute findings. Mediastinum/Lymph Nodes: No masses or pathologically enlarged lymph nodes identified. Lungs/Pleura: No suspicious pulmonary nodules or masses identified. A 1.5 cm nodule containing macroscopic fat is again seen in the right lung base abutting the diaphragm, which is stable and consistent with a benign hamartoma. No effusion present. Musculoskeletal: Expected post lumpectomy changes in the upper inner quadrant right  breast. No suspicious bone lesions identified. CT ABDOMEN AND PELVIS FINDINGS Hepatobiliary: No masses identified. Mild diffuse hepatic steatosis. Gallbladder is unremarkable. Pancreas:  No mass or inflammatory changes. Spleen:  Within normal limits in size and appearance. Adrenals/Urinary tract: No masses or hydronephrosis. Unremarkable unopacified urinary bladder. Stomach/Bowel: No evidence of obstruction, inflammatory process, or abnormal fluid collections. Normal appendix visualized. Vascular/Lymphatic: 11 mm aorto-caval lymph node on image 78/2 has increased in size from 7 mm on previous study. No other pathologically enlarged lymph nodes are identified within the abdomen or pelvis. Aortic atherosclerosis. No abdominal aortic aneurysm. Reproductive: Interval hysterectomy noted. Adnexal regions are unremarkable in appearance. Other:  None. Musculoskeletal:  No suspicious bone lesions identified. IMPRESSION: Increased size of 1.1 cm aorto-caval retroperitoneal lymph node. This could be reactive due to interval hysterectomy, however metastatic carcinoma cannot be excluded. Consider continued attention on short-term follow-up CT, or PET-CT scan for further evaluation. Mild hepatic steatosis. Electronically Signed   By: JEarle GellM.D.   On: 12/30/2017 14:35   Ct Abdomen Pelvis W Contrast  Result Date: 12/30/2017 CLINICAL DATA:  Follow-up endometrial carcinoma and right breast carcinoma. Ongoing immunotherapy. Previous surgery, chemotherapy, and radiation therapy. EXAM: CT CHEST, ABDOMEN, AND PELVIS WITH CONTRAST TECHNIQUE: Multidetector CT imaging of the chest, abdomen and pelvis was performed following the standard protocol during bolus administration of intravenous contrast. CONTRAST:  1082mOMNIPAQUE IOHEXOL 300 MG/ML  SOLN COMPARISON:  Chest CT on 05/07/2017, and AP CT on 04/03/2017 FINDINGS: CT CHEST  FINDINGS Cardiovascular: No acute findings. Mediastinum/Lymph Nodes: No masses or pathologically enlarged  lymph nodes identified. Lungs/Pleura: No suspicious pulmonary nodules or masses identified. A 1.5 cm nodule containing macroscopic fat is again seen in the right lung base abutting the diaphragm, which is stable and consistent with a benign hamartoma. No effusion present. Musculoskeletal: Expected post lumpectomy changes in the upper inner quadrant right breast. No suspicious bone lesions identified. CT ABDOMEN AND PELVIS FINDINGS Hepatobiliary: No masses identified. Mild diffuse hepatic steatosis. Gallbladder is unremarkable. Pancreas:  No mass or inflammatory changes. Spleen:  Within normal limits in size and appearance. Adrenals/Urinary tract: No masses or hydronephrosis. Unremarkable unopacified urinary bladder. Stomach/Bowel: No evidence of obstruction, inflammatory process, or abnormal fluid collections. Normal appendix visualized. Vascular/Lymphatic: 11 mm aorto-caval lymph node on image 78/2 has increased in size from 7 mm on previous study. No other pathologically enlarged lymph nodes are identified within the abdomen or pelvis. Aortic atherosclerosis. No abdominal aortic aneurysm. Reproductive: Interval hysterectomy noted. Adnexal regions are unremarkable in appearance. Other:  None. Musculoskeletal:  No suspicious bone lesions identified. IMPRESSION: Increased size of 1.1 cm aorto-caval retroperitoneal lymph node. This could be reactive due to interval hysterectomy, however metastatic carcinoma cannot be excluded. Consider continued attention on short-term follow-up CT, or PET-CT scan for further evaluation. Mild hepatic steatosis. Electronically Signed   By: Earle Gell M.D.   On: 12/30/2017 14:35    ASSESSMENT & PLAN:  67 y.o. 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 Malignant neoplasm of upper inner quadrant of right breast , Invasive ductal carcinoma, pT2N0M0, G2,  ER+/PR-/HER2+ -She underwent right breast  lumpectomy with sentinel lymph node biopsy with Dr. Excell Seltzer on 05/25/2017 with port placement -Pathology confirmed invasive ductal carcinoma, grade 2, spanning 2.2 cm, margins were clear, no positive lymph nodes, ER positive, PR negative, HER-2 positive. I reviewed this with the patient previously -We previously discussed the risk of cancer recurrence after completed surgical resection.  Due to the HER-2 positive disease, she is at moderate to high risk  I recommended adjuvant chemotherapy at that time. The regiment was selected to cover for endometrial cancer also. -She completed adjuvant TCH every 3 weeks with Onpro from 07/01/17-10/14/17. I changed Taxol to abraxane with cycle 4 due to drug rash reaction. She tolerated relatively well besides the rash and Grade 1 neuropathy. I encouraged her to take a B complex vitamin.  -She is currently on Herceptin maintenance therapy began on 11/04/17. Plan for a total of 12 months. Will complete in Oct 2019.  -She started adjuvant radiation therapy on her breast on 11/01/17 with [name] and completed on 12/08/17. -She started Letrozole on 12/23/17 and is tolerating well with no present side effects at this time.  -We discussed her CT CAP from 12/30/17 which shows 1.1 cm aorta-caval retroperitoneal lymph node which is borderline enlarged, likely reactive, but metastatic disease is not ruled out.  -Pt expressed her concern this is cancer.  -I explained CT scan cannot alone conclude cancer. I recommend we watch this for any changes with another scan in 2 months. Given the current size and location of lymph node, there is a increased risk with a biopsy at this time.  -I also discussed the treatment options if this is cancer recurrence  -I will discuss her scan with Drs. Isidore Moos and Denman George. -F/u in 3 weeks.  Continue maintenance Herceptin until October 2019.   2. Endometrial adenocarcinoma  with lymph node metastasis, pT1apN1M0, FIGO stage IIIc  -She underwent a total  hysterectomy with salpingo-oophorectomy  by Dr. Denman George on 06/09/17 -We previously discussed her surgical pathology. she has high risk features including lymphovascular involvement, nodal positive disease, and mixed histology with endometrioid, clear cell, and serous carcinoma  -We previously discussed the high risk of cancer recurrence after surgery extensively, and I strongly encouraged her to consider adjuvant chemotherapy to reduce her risk of recurrence. -She completed Taxol/carbo (taxol changed to abraxane) every 3 weeks x6 cycles on 10/14/17 and adjuvant vaginal brachytherapy from 08/18/17-09/17/17.  She tolerated well overall.  She experiences mild vaginal bleeding when using dilator as instructed per rad onc. -We will continue cancer surveillance, repeat CT scan every 6-12 months, up to 5 years -Her 12/30/17 CT scan showed 1.1 cm aorta-caval retroperitoneal lymph node which is borderline reactive. I recommend to watch this closely with another scan in 2 months.  -She requested to be referred to GYN oncologist Dr. Fay Records at [facility] for second opinion.  I supported her decision and referral was made yesterday    3. Genetics -Given her personal history of breast and endometrial cancer, and a family history of malignancy, she will be referred to see a genetic counselor for genetic testing to ruled out cancer syndromes. -Genetics results were negative   4. HTN -We monitored her Hypertension while on chemo, she previously held HCTZ on days of chemo -F/u with PCP  5. Vertigo  -Symptoms have returned after surgery  -She restarted her Meclizine, much improved   6.  Transaminitis  - LFTs are chronically elevated due to fatty liver disease -We monitored her liver functions closely while on chemo, stable overall  -Will continue monitoring    7. Bone Health  -I discussed that aromatase inhibitors can potentially weaken the bone. I encouraged her to be active and to take a Vit D and Ca  supplement.  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 I discussed this can take 1-2 years to recover.  -She will continue B complex. I recommend Neurontin for her tingling and pain. I discussed side effects. She would like to hold off for now.  -I encouraged her to remain active with exercise.   Follow-up: -Continue Letrozole  -Lab, flush, Herceptin tomorrow and every 3 weeks  -F/u on 5/15 with Lacie  -I will review her CT scan with Drs Camelia Eng and Denman George, and order a follow up CT if we have consensus  -I wrote a letter for her to return to work on Jan 19, 2018, and will fill out her paperworks    No orders of the defined types were placed in this encounter.   Pt and her husband had many questions. All questions were answered. The patient knows to call the clinic with any problems, questions or concerns. I spent 30 minutes counseling the patient face to face. The total time spent in the appointment was 40 minutes and more than 50% was on counseling.   This document serves as a record of services personally performed by Truitt Merle, MD. It was created on her behalf by Joslyn Devon, a trained medical scribe. The creation of this record is based on the scribe's personal observations and the provider's statements to them.   I have reviewed the above documentation for accuracy and completeness, and I agree with the above.   Truitt Merle  01/05/2018 12:50  PM

## 2018-01-05 NOTE — Telephone Encounter (Signed)
Called [facility] to get CD ready for p/u tomorrow.

## 2018-01-05 NOTE — Telephone Encounter (Signed)
No los 4/23 °

## 2018-01-06 ENCOUNTER — Inpatient Hospital Stay: Payer: Medicare Other

## 2018-01-06 VITALS — BP 129/75 | HR 78 | Temp 98.6°F | Resp 18

## 2018-01-06 DIAGNOSIS — I10 Essential (primary) hypertension: Secondary | ICD-10-CM | POA: Diagnosis not present

## 2018-01-06 DIAGNOSIS — Z95828 Presence of other vascular implants and grafts: Secondary | ICD-10-CM

## 2018-01-06 DIAGNOSIS — C779 Secondary and unspecified malignant neoplasm of lymph node, unspecified: Secondary | ICD-10-CM | POA: Diagnosis not present

## 2018-01-06 DIAGNOSIS — Z5112 Encounter for antineoplastic immunotherapy: Secondary | ICD-10-CM | POA: Diagnosis not present

## 2018-01-06 DIAGNOSIS — Z170 Estrogen receptor positive status [ER+]: Secondary | ICD-10-CM | POA: Diagnosis not present

## 2018-01-06 DIAGNOSIS — C50211 Malignant neoplasm of upper-inner quadrant of right female breast: Secondary | ICD-10-CM

## 2018-01-06 DIAGNOSIS — C541 Malignant neoplasm of endometrium: Secondary | ICD-10-CM | POA: Diagnosis not present

## 2018-01-06 LAB — CBC WITH DIFFERENTIAL/PLATELET
BASOS PCT: 1 %
Basophils Absolute: 0 10*3/uL (ref 0.0–0.1)
EOS ABS: 0 10*3/uL (ref 0.0–0.5)
EOS PCT: 1 %
HCT: 37.7 % (ref 34.8–46.6)
Hemoglobin: 13.2 g/dL (ref 11.6–15.9)
LYMPHS ABS: 1.4 10*3/uL (ref 0.9–3.3)
Lymphocytes Relative: 43 %
MCH: 36.4 pg — AB (ref 25.1–34.0)
MCHC: 34.9 g/dL (ref 31.5–36.0)
MCV: 104.1 fL — ABNORMAL HIGH (ref 79.5–101.0)
MONO ABS: 0.5 10*3/uL (ref 0.1–0.9)
MONOS PCT: 16 %
Neutro Abs: 1.3 10*3/uL — ABNORMAL LOW (ref 1.5–6.5)
Neutrophils Relative %: 39 %
Platelets: 175 10*3/uL (ref 145–400)
RBC: 3.62 MIL/uL — ABNORMAL LOW (ref 3.70–5.45)
RDW: 12.5 % (ref 11.2–14.5)
WBC: 3.3 10*3/uL — ABNORMAL LOW (ref 3.9–10.3)

## 2018-01-06 LAB — COMPREHENSIVE METABOLIC PANEL
ALBUMIN: 3.7 g/dL (ref 3.5–5.0)
ALK PHOS: 47 U/L (ref 40–150)
ALT: 143 U/L — ABNORMAL HIGH (ref 0–55)
ANION GAP: 11 (ref 3–11)
AST: 76 U/L — ABNORMAL HIGH (ref 5–34)
BUN: 11 mg/dL (ref 7–26)
CALCIUM: 9.6 mg/dL (ref 8.4–10.4)
CO2: 25 mmol/L (ref 22–29)
Chloride: 99 mmol/L (ref 98–109)
Creatinine, Ser: 0.78 mg/dL (ref 0.60–1.10)
GFR calc non Af Amer: >60 mL/min (ref >60)
GLUCOSE: 103 mg/dL (ref 70–140)
POTASSIUM: 3.3 mmol/L — AB (ref 3.5–5.1)
SODIUM: 135 mmol/L — AB (ref 136–145)
TOTAL PROTEIN: 7.2 g/dL (ref 6.4–8.3)
Total Bilirubin: 0.4 mg/dL (ref 0.2–1.2)

## 2018-01-06 MED ORDER — DIPHENHYDRAMINE HCL 25 MG PO CAPS
50.0000 mg | ORAL_CAPSULE | Freq: Once | ORAL | Status: AC
  Administered 2018-01-06: 50 mg via ORAL

## 2018-01-06 MED ORDER — HEPARIN SOD (PORK) LOCK FLUSH 100 UNIT/ML IV SOLN
500.0000 [IU] | Freq: Once | INTRAVENOUS | Status: AC | PRN
  Administered 2018-01-06: 500 [IU]
  Filled 2018-01-06: qty 5

## 2018-01-06 MED ORDER — SODIUM CHLORIDE 0.9 % IV SOLN
Freq: Once | INTRAVENOUS | Status: AC
  Administered 2018-01-06: 11:00:00 via INTRAVENOUS

## 2018-01-06 MED ORDER — ACETAMINOPHEN 325 MG PO TABS
650.0000 mg | ORAL_TABLET | Freq: Once | ORAL | Status: AC
  Administered 2018-01-06: 650 mg via ORAL

## 2018-01-06 MED ORDER — TRASTUZUMAB CHEMO 150 MG IV SOLR
6.0000 mg/kg | Freq: Once | INTRAVENOUS | Status: AC
  Administered 2018-01-06: 525 mg via INTRAVENOUS
  Filled 2018-01-06: qty 25

## 2018-01-06 MED ORDER — SODIUM CHLORIDE 0.9% FLUSH
10.0000 mL | INTRAVENOUS | Status: DC | PRN
  Administered 2018-01-06: 10 mL
  Filled 2018-01-06: qty 10

## 2018-01-06 MED ORDER — DIPHENHYDRAMINE HCL 25 MG PO CAPS
ORAL_CAPSULE | ORAL | Status: AC
Start: 2018-01-06 — End: 2018-01-06
  Filled 2018-01-06: qty 2

## 2018-01-06 MED ORDER — ACETAMINOPHEN 325 MG PO TABS
ORAL_TABLET | ORAL | Status: AC
  Filled 2018-01-06: qty 2

## 2018-01-06 MED ORDER — SODIUM CHLORIDE 0.9% FLUSH
10.0000 mL | INTRAVENOUS | Status: DC | PRN
  Administered 2018-01-06: 10 mL via INTRAVENOUS
  Filled 2018-01-06: qty 10

## 2018-01-06 NOTE — Patient Instructions (Signed)
[facility] Discharge Instructions for Patients Receiving Chemotherapy  Today you received the following chemotherapy agents Herceptin  To help prevent nausea and vomiting after your treatment, we encourage you to take your nausea medication as directed   If you develop nausea and vomiting that is not controlled by your nausea medication, call the clinic.   BELOW ARE SYMPTOMS THAT SHOULD BE REPORTED IMMEDIATELY:  *FEVER GREATER THAN 100.5 F  *CHILLS WITH OR WITHOUT FEVER  NAUSEA AND VOMITING THAT IS NOT CONTROLLED WITH YOUR NAUSEA MEDICATION  *UNUSUAL SHORTNESS OF BREATH  *UNUSUAL BRUISING OR BLEEDING  TENDERNESS IN MOUTH AND THROAT WITH OR WITHOUT PRESENCE OF ULCERS  *URINARY PROBLEMS  *BOWEL PROBLEMS  UNUSUAL RASH Items with * indicate a potential emergency and should be followed up as soon as possible.  Feel free to call the clinic should you have any questions or concerns. The clinic phone number is [phone].  Please show the CHEMO ALERT CARD at check-in to the Emergency Department and triage nurse.   

## 2018-01-07 ENCOUNTER — Telehealth: Payer: Self-pay | Admitting: Hematology

## 2018-01-07 NOTE — Telephone Encounter (Signed)
Faxed records to Dr. Rhodia Albright @ [facility].  Dr. Asa Saunas nurse will call pt with appt.

## 2018-01-08 ENCOUNTER — Encounter: Payer: Self-pay | Admitting: Radiation Oncology

## 2018-01-11 ENCOUNTER — Other Ambulatory Visit: Payer: Self-pay | Admitting: Hematology

## 2018-01-11 ENCOUNTER — Telehealth: Payer: Self-pay | Admitting: Hematology

## 2018-01-11 DIAGNOSIS — C541 Malignant neoplasm of endometrium: Secondary | ICD-10-CM

## 2018-01-11 NOTE — Telephone Encounter (Signed)
Spoke with patient to inform FMLA has been successfully faxed to [employer] at [phone]. Mailed copy to address on file per patient request

## 2018-01-12 ENCOUNTER — Telehealth: Payer: Self-pay

## 2018-01-12 DIAGNOSIS — H268 Other specified cataract: Secondary | ICD-10-CM | POA: Diagnosis not present

## 2018-01-12 DIAGNOSIS — H25812 Combined forms of age-related cataract, left eye: Secondary | ICD-10-CM | POA: Diagnosis not present

## 2018-01-12 NOTE — Telephone Encounter (Signed)
Pt called to let office know that she will now be using [facility] for mail order prescriptions.   Pt requested note be put in her chart.   I have added [facility] to her pharmacy list.

## 2018-01-13 ENCOUNTER — Other Ambulatory Visit: Payer: Self-pay | Admitting: Hematology

## 2018-01-13 ENCOUNTER — Telehealth: Payer: Self-pay | Admitting: Hematology

## 2018-01-13 ENCOUNTER — Other Ambulatory Visit: Payer: Self-pay

## 2018-01-13 ENCOUNTER — Encounter: Payer: Self-pay | Admitting: Radiation Oncology

## 2018-01-13 ENCOUNTER — Ambulatory Visit
Admission: RE | Admit: 2018-01-13 | Discharge: 2018-01-13 | Disposition: A | Discharge status: Home / Self Care | Payer: Medicare Other | Source: Ambulatory Visit | Attending: Radiation Oncology | Admitting: Radiation Oncology

## 2018-01-13 ENCOUNTER — Other Ambulatory Visit: Payer: Self-pay | Admitting: *Deleted

## 2018-01-13 ENCOUNTER — Other Ambulatory Visit (HOSPITAL_COMMUNITY)
Admission: RE | Admit: 2018-01-13 | Discharge: 2018-01-13 | Disposition: A | Discharge status: Home / Self Care | Payer: Medicare Other | Source: Ambulatory Visit | Attending: Radiation Oncology | Admitting: Radiation Oncology

## 2018-01-13 VITALS — BP 127/71 | HR 73 | Temp 98.5°F | Resp 18 | Ht 63.0 in | Wt 188.4 lb

## 2018-01-13 DIAGNOSIS — Z79899 Other long term (current) drug therapy: Secondary | ICD-10-CM | POA: Insufficient documentation

## 2018-01-13 DIAGNOSIS — Z923 Personal history of irradiation: Secondary | ICD-10-CM | POA: Diagnosis not present

## 2018-01-13 DIAGNOSIS — Z170 Estrogen receptor positive status [ER+]: Secondary | ICD-10-CM | POA: Diagnosis not present

## 2018-01-13 DIAGNOSIS — C541 Malignant neoplasm of endometrium: Secondary | ICD-10-CM

## 2018-01-13 DIAGNOSIS — C50211 Malignant neoplasm of upper-inner quadrant of right female breast: Secondary | ICD-10-CM | POA: Diagnosis not present

## 2018-01-13 DIAGNOSIS — N898 Other specified noninflammatory disorders of vagina: Secondary | ICD-10-CM | POA: Insufficient documentation

## 2018-01-13 DIAGNOSIS — Z79811 Long term (current) use of aromatase inhibitors: Secondary | ICD-10-CM | POA: Insufficient documentation

## 2018-01-13 DIAGNOSIS — Z90710 Acquired absence of both cervix and uterus: Secondary | ICD-10-CM | POA: Insufficient documentation

## 2018-01-13 DIAGNOSIS — I700 Atherosclerosis of aorta: Secondary | ICD-10-CM | POA: Diagnosis not present

## 2018-01-13 DIAGNOSIS — K760 Fatty (change of) liver, not elsewhere classified: Secondary | ICD-10-CM | POA: Diagnosis not present

## 2018-01-13 MED ORDER — PITAVASTATIN CALCIUM 2 MG PO TABS: 3.0000 mg | ORAL_TABLET | Freq: Every evening | ORAL | 3 refills | Status: DC

## 2018-01-13 MED ORDER — IBANDRONATE SODIUM 150 MG PO TABS: ORAL_TABLET | ORAL | 3 refills | Status: DC

## 2018-01-13 MED ORDER — OLMESARTAN MEDOXOMIL-HCTZ 20-12.5 MG PO TABS: 1.0000 | ORAL_TABLET | Freq: Every day | ORAL | 2 refills | Status: DC

## 2018-01-13 NOTE — Telephone Encounter (Signed)
PT APPT WITH DR MGNOI IS 01/28/18@1 :00. PT IS AWARE

## 2018-01-13 NOTE — Progress Notes (Signed)
[facility] End of Treatment Note  Name:Dawn [name]  Date: 09/17/2017 LUN:[id number] DOB:20-Mar-1951    DIAGNOSIS: The encounter diagnosis was Endometrial cancer ([place]).  Cancer Staging Endometrial cancer [facility]) Staging form: Corpus Uteri - Adenosarcoma, AJCC 8th Edition - Pathologic stage from 06/09/2017: Stage IIIC (pT1a, pN1, cM0) - Signed by Truitt Merle, MD on 06/16/2017  Malignant neoplasm of upper-inner quadrant of right breast in female, estrogen receptor positive ([place]) Staging form: Breast, AJCC 8th Edition - Clinical stage from 05/06/2017: Stage IA (cT1c, cN0, cM0, G2, ER: Positive, PR: Negative, HER2: Positive) - Unsigned - Pathologic stage from 05/25/2017: Stage IIA (pT2, pN0, cM0, G2, ER: Positive, PR: Negative, HER2: Negative) - Signed by Truitt Merle, MD on 06/16/2017    INDICATION FOR TREATMENT: Curative   TREATMENT DATES:  08-21-17 to 09-17-17                          SITE/DOSE:  Vaginal cuff (upper 4cm) / 30 Gy in 5 fractions Rx to muocsal surface                    BEAMS/ENERGY:   HDR Intracavitary brachytherapy/ [id number]                NARRATIVE:  She tolerated treatment well without complications.                          PLAN: Routine followup in 6 weeks to plan breast RT. Patient instructed to call if questions or worsening complaints in interim.  -----------------------------------  [name], MD

## 2018-01-13 NOTE — Progress Notes (Signed)
Radiation Oncology         [phone] ________________________________ Name: Dawn [name] MRN: [id number]  Date: 01/13/2018  DOB: 1951/01/26  Follow-Up Visit Note  Outpatient  CC: [name], MD  Truitt Merle, MD  Diagnosis and Prior Radiotherapy:    ICD-10-CM   1. Endometrial cancer ([place]) C54.1   2. Malignant neoplasm of upper-inner quadrant of right breast in female, estrogen receptor positive ([place]) C50.211    Z17.0     Cancer Staging Endometrial cancer [facility]) Staging form: Corpus Uteri - Adenosarcoma, AJCC 8th Edition - Pathologic stage from 06/09/2017: Stage IIIC (pT1a, pN1, cM0) - Signed by Truitt Merle, MD on 06/16/2017  Malignant neoplasm of upper-inner quadrant of right breast in female, estrogen receptor positive ([place]) Staging form: Breast, AJCC 8th Edition - Clinical stage from 05/06/2017: Stage IA (cT1c, cN0, cM0, G2, ER: Positive, PR: Negative, HER2: Positive) - Unsigned - Pathologic stage from 05/25/2017: Stage IIA (pT2, pN0, cM0, G2, ER: Positive, PR: Negative, HER2: Negative) - Signed by Truitt Merle, MD on 06/16/2017  CHIEF COMPLAINT: Here for follow-up and surveillance of right breast cancer and endometrial cancer  Breast Radiation treatment dates:   11/11/17 - 12/08/17  Site/dose:   Right breast treated to 40.05 Gy with 15 fx of 2.67 Gy and a boost of 10 Gy with 5 fx  [facility]ERINE TREATMENT DATES:  08-21-17 to 09-17-17                                                   SITE/DOSE:  Vaginal cuff (upper 4cm) / 30 Gy in 5 fractions Rx to muocsal surface                   Narrative:  The patient returns today for routine follow-up. She is accompanied by her husband today. She has a follow up appointment with Dr. Rhodia Albright soon (01/28/2018). She was informed about possibly obtaining a PET scan soon by [name] recently, however she is waiting on approval form her insurance company first. She notes that she hasn't had a follow up with Dr. Denman George since September or a recent GYN  exam. She continues on letrozole with good tolerance.   Since the patient last visit to the office, she underwent CT CAP with contrast on 12/30/2017 with results showing: Increased size of 1.1 cm aorto-caval retroperitoneal lymph node. I reviewed this scan. This could be reactive due to interval hysterectomy, however metastatic carcinoma cannot be excluded. Consider continued attention on short-term follow-up CT, or PET-CT scan for further evaluation. Mild hepatic steatosis.  On review of systems, she reports mild yellow vaginal discharge. She has been using Radiaplex BID. She denies vaginal bleeding and any other symptoms.        I personally reviewed the patient's imaging prior to today's visit.             ALLERGIES:  is allergic to ciprofloxacin; crestor [rosuvastatin calcium]; fosamax [alendronate sodium]; penicillins; and taxol [paclitaxel].  Meds: Current Outpatient Medications  Medication Sig Dispense Refill  . acetaminophen (TYLENOL) 325 MG tablet Take 650 mg by mouth every 6 (six) hours as needed for mild pain or moderate pain.    . Ascorbic Acid (VITAMIN C) 1000 MG tablet Take 1,000 mg by mouth daily.    Marland Kitchen b complex vitamins capsule Take 1 capsule by mouth daily.    Marland Kitchen  ibandronate (BONIVA) 150 MG tablet Take 1 tablet (150 mg total) by mouth every 30 (thirty) days. Take in the morning with a full glass of water, on an empty stomach, and do not take anything else by mouth or lie down for the next 30 min. 3 tablet 3  . letrozole (FEMARA) 2.5 MG tablet Take 1 tablet (2.5 mg total) by mouth daily. 30 tablet 2  . meclizine (ANTIVERT) 25 MG tablet Take 1-2 tablets (25-50 mg total) by mouth 3 (three) times daily as needed for dizziness. (Patient taking differently: Take 25-50 mg by mouth 3 (three) times daily as needed for dizziness (depends on dizziness if takes 1-2 tablets). ) 30 tablet 0  . moxifloxacin (VIGAMOX) 0.5 % ophthalmic solution   0  . Multiple Vitamin (MULTIVITAMIN) capsule Take 1  capsule by mouth daily.      Marland Kitchen olmesartan-hydrochlorothiazide (BENICAR HCT) 20-12.5 MG tablet Take 1 tablet by mouth daily. 90 tablet 2  . Pitavastatin Calcium (LIVALO) 2 MG TABS Take 3 mg by mouth every evening. Takes 1.5 tablet    . prednisoLONE acetate (PRED FORTE) 1 % ophthalmic suspension   0  . PROLENSA 0.07 % SOLN   0  . sodium chloride (MURO 128) 5 % ophthalmic ointment Place 1 application into both eyes 2 (two) times daily.     . prochlorperazine (COMPAZINE) 10 MG tablet Take 1 tablet (10 mg total) by mouth every 6 (six) hours as needed (Nausea or vomiting). (Patient not taking: Reported on 01/13/2018) 30 tablet 1   No current facility-administered medications for this encounter.     Physical Findings: The patient is in no acute distress. Patient is alert and oriented.  height is '5\' 3"'$  (1.6 m) and weight is 188 lb 6.4 oz (85.5 kg). Her temperature is 98.5 F (36.9 C). Her blood pressure is 127/71 and her pulse is 73. Her respiration is 18 and oxygen saturation is 96%. .    Neck: Neck is supple, no palpable cervical or supraclavicular lymphadenopathy. Lymphatics: see Neck Exam. Groin without masses.  Skin: No concerning lesions. Breast: Skin healing in radiotherapy fields with minimal signs of prior radiation exposure. Skin is slightly dark over right breast.  Pelvic: external genitalia appears healthy. No visible or palpable masses in the vaginal cuff. She did have superficial bleeding that developed during speculum exam. A Pap smear was obtained. She tolerated this well.    Lab Findings: Lab Results  Component Value Date   WBC 3.3 (L) 01/06/2018   HGB 13.2 01/06/2018   HCT 37.7 01/06/2018   MCV 104.1 (H) 01/06/2018   PLT 175 01/06/2018    Radiographic Findings: Ct Chest W Contrast  Result Date: 12/30/2017 CLINICAL DATA:  Follow-up endometrial carcinoma and right breast carcinoma. Ongoing immunotherapy. Previous surgery, chemotherapy, and radiation therapy. EXAM: CT CHEST,  ABDOMEN, AND PELVIS WITH CONTRAST TECHNIQUE: Multidetector CT imaging of the chest, abdomen and pelvis was performed following the standard protocol during bolus administration of intravenous contrast. CONTRAST:  149m OMNIPAQUE IOHEXOL 300 MG/ML  SOLN COMPARISON:  Chest CT on 05/07/2017, and AP CT on 04/03/2017 FINDINGS: CT CHEST FINDINGS Cardiovascular: No acute findings. Mediastinum/Lymph Nodes: No masses or pathologically enlarged lymph nodes identified. Lungs/Pleura: No suspicious pulmonary nodules or masses identified. A 1.5 cm nodule containing macroscopic fat is again seen in the right lung base abutting the diaphragm, which is stable and consistent with a benign hamartoma. No effusion present. Musculoskeletal: Expected post lumpectomy changes in the upper inner quadrant right breast. No  suspicious bone lesions identified. CT ABDOMEN AND PELVIS FINDINGS Hepatobiliary: No masses identified. Mild diffuse hepatic steatosis. Gallbladder is unremarkable. Pancreas:  No mass or inflammatory changes. Spleen:  Within normal limits in size and appearance. Adrenals/Urinary tract: No masses or hydronephrosis. Unremarkable unopacified urinary bladder. Stomach/Bowel: No evidence of obstruction, inflammatory process, or abnormal fluid collections. Normal appendix visualized. Vascular/Lymphatic: 11 mm aorto-caval lymph node on image 78/2 has increased in size from 7 mm on previous study. No other pathologically enlarged lymph nodes are identified within the abdomen or pelvis. Aortic atherosclerosis. No abdominal aortic aneurysm. Reproductive: Interval hysterectomy noted. Adnexal regions are unremarkable in appearance. Other:  None. Musculoskeletal:  No suspicious bone lesions identified. IMPRESSION: Increased size of 1.1 cm aorto-caval retroperitoneal lymph node. This could be reactive due to interval hysterectomy, however metastatic carcinoma cannot be excluded. Consider continued attention on short-term follow-up CT, or  PET-CT scan for further evaluation. Mild hepatic steatosis. Electronically Signed   By: Earle Gell M.D.   On: 12/30/2017 14:35   Ct Abdomen Pelvis W Contrast  Result Date: 12/30/2017 CLINICAL DATA:  Follow-up endometrial carcinoma and right breast carcinoma. Ongoing immunotherapy. Previous surgery, chemotherapy, and radiation therapy. EXAM: CT CHEST, ABDOMEN, AND PELVIS WITH CONTRAST TECHNIQUE: Multidetector CT imaging of the chest, abdomen and pelvis was performed following the standard protocol during bolus administration of intravenous contrast. CONTRAST:  198m OMNIPAQUE IOHEXOL 300 MG/ML  SOLN COMPARISON:  Chest CT on 05/07/2017, and AP CT on 04/03/2017 FINDINGS: CT CHEST FINDINGS Cardiovascular: No acute findings. Mediastinum/Lymph Nodes: No masses or pathologically enlarged lymph nodes identified. Lungs/Pleura: No suspicious pulmonary nodules or masses identified. A 1.5 cm nodule containing macroscopic fat is again seen in the right lung base abutting the diaphragm, which is stable and consistent with a benign hamartoma. No effusion present. Musculoskeletal: Expected post lumpectomy changes in the upper inner quadrant right breast. No suspicious bone lesions identified. CT ABDOMEN AND PELVIS FINDINGS Hepatobiliary: No masses identified. Mild diffuse hepatic steatosis. Gallbladder is unremarkable. Pancreas:  No mass or inflammatory changes. Spleen:  Within normal limits in size and appearance. Adrenals/Urinary tract: No masses or hydronephrosis. Unremarkable unopacified urinary bladder. Stomach/Bowel: No evidence of obstruction, inflammatory process, or abnormal fluid collections. Normal appendix visualized. Vascular/Lymphatic: 11 mm aorto-caval lymph node on image 78/2 has increased in size from 7 mm on previous study. No other pathologically enlarged lymph nodes are identified within the abdomen or pelvis. Aortic atherosclerosis. No abdominal aortic aneurysm. Reproductive: Interval hysterectomy noted.  Adnexal regions are unremarkable in appearance. Other:  None. Musculoskeletal:  No suspicious bone lesions identified. IMPRESSION: Increased size of 1.1 cm aorto-caval retroperitoneal lymph node. This could be reactive due to interval hysterectomy, however metastatic carcinoma cannot be excluded. Consider continued attention on short-term follow-up CT, or PET-CT scan for further evaluation. Mild hepatic steatosis. Electronically Signed   By: JEarle GellM.D.   On: 12/30/2017 14:35    Impression/Plan:  Healing well from radiotherapy to the breast tissue. Vaginal cuff appears without disease, pap smear pending.  Continue skin care with topical Vitamin E Oil and / or lotion for at least 2 more months for further healing.  I encouraged her to continue with yearly mammography as appropriate (for intact breast tissue) and followup with medical oncology.   A PET scan has been ordered by Dr. FBurr Medicoto workup an abdominal lymph node that is somewhat suspicious. Our team will discuss the results after the scan is complete.   I will tentatively see her back in 6 months,  sooner if needed depending on the results of her PET scan. Patient will call  Dr. Denman George to establish follow up with her.  She is also seeing Dr Rhodia Albright to discuss her CT imaging at [facility] soon.     I spent 25 minutes face to face with the patient and more than 50% of that time was spent in counseling and/or coordination of care. _____________________________________   [name], MD  This document serves as a record of services personally performed by [name], MD. It was created on her behalf by Steva Colder, a trained medical scribe. The creation of this record is based on the scribe's personal observations and the provider's statements to them. This document has been checked and approved by the attending provider.

## 2018-01-13 NOTE — Progress Notes (Signed)
Dawn [name] presents for follow up of radiation completed 12/08/17 to her right breast. 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 She has hyperpigmentation present to her right breast. She continues to use radiaplex twice daily. She will begin using vitamin E oil or lotion when the radiaplex is complete. She is taking letrozole and continues Herceptin every 3 weeks. She plans to see Dr. Rhodia Albright at [facility] to discuss a lymph node finding on a recent CT scan on 01/28/18.   BP 127/71   Pulse 73   Temp 98.5 F (36.9 C)   Resp 18   Ht 5\' 3"  (1.6 m)   Wt 188 lb 6.4 oz (85.5 kg)   SpO2 96% Comment: room air  BMI 33.37 kg/m    Wt Readings from Last 3 Encounters:  01/13/18 188 lb 6.4 oz (85.5 kg)  01/05/18 188 lb 8 oz (85.5 kg)  12/16/17 190 lb 4.8 oz (86.3 kg)

## 2018-01-15 ENCOUNTER — Telehealth: Payer: Self-pay

## 2018-01-15 DIAGNOSIS — N838 Other noninflammatory disorders of ovary, fallopian tube and broad ligament: Secondary | ICD-10-CM | POA: Diagnosis not present

## 2018-01-15 DIAGNOSIS — N888 Other specified noninflammatory disorders of cervix uteri: Secondary | ICD-10-CM | POA: Diagnosis not present

## 2018-01-15 DIAGNOSIS — C541 Malignant neoplasm of endometrium: Secondary | ICD-10-CM | POA: Diagnosis not present

## 2018-01-15 DIAGNOSIS — C772 Secondary and unspecified malignant neoplasm of intra-abdominal lymph nodes: Secondary | ICD-10-CM | POA: Diagnosis not present

## 2018-01-15 DIAGNOSIS — C542 Malignant neoplasm of myometrium: Secondary | ICD-10-CM | POA: Diagnosis not present

## 2018-01-15 LAB — CYTOLOGY - PAP: DIAGNOSIS: NEGATIVE

## 2018-01-15 NOTE — Telephone Encounter (Signed)
I called and left a voice mail with Ms. Perras informing her that the PAP smear performed 01/13/18 in our office was negative. I left my direct number if she has any questions or concerns.

## 2018-01-18 ENCOUNTER — Telehealth: Payer: Self-pay

## 2018-01-18 ENCOUNTER — Telehealth: Payer: Self-pay | Admitting: *Deleted

## 2018-01-18 ENCOUNTER — Other Ambulatory Visit: Payer: Self-pay

## 2018-01-18 DIAGNOSIS — C541 Malignant neoplasm of endometrium: Secondary | ICD-10-CM

## 2018-01-18 MED ORDER — LETROZOLE 2.5 MG PO TABS: 2.5000 mg | ORAL_TABLET | Freq: Every day | ORAL | 2 refills | Status: DC

## 2018-01-18 MED FILL — LETROZOLE 2.5 MG TABLET: 2.5 | 30 days supply | Qty: 30 | Fill #0

## 2018-01-18 NOTE — Telephone Encounter (Signed)
Faxed refill for Letrozole tabs to [facility].

## 2018-01-18 NOTE — Telephone Encounter (Signed)
Faxed ROI to [facility]; release [phone]

## 2018-01-21 NOTE — Progress Notes (Signed)
Disability paperwork successfully faxed to [employer] at [phone]. Mailed copy to patient address on file.

## 2018-01-25 ENCOUNTER — Encounter (HOSPITAL_COMMUNITY)
Admission: RE | Admit: 2018-01-25 | Discharge: 2018-01-25 | Disposition: A | Discharge status: Home / Self Care | Payer: Medicare Other | Source: Ambulatory Visit | Attending: Hematology | Admitting: Hematology

## 2018-01-25 DIAGNOSIS — C541 Malignant neoplasm of endometrium: Secondary | ICD-10-CM | POA: Diagnosis not present

## 2018-01-25 LAB — GLUCOSE, CAPILLARY: GLUCOSE-CAPILLARY: 111 mg/dL — AB (ref 65–99)

## 2018-01-25 MED ORDER — FLUDEOXYGLUCOSE F - 18 (FDG) INJECTION
9.3000 | Freq: Once | INTRAVENOUS | Status: AC | PRN
  Administered 2018-01-25: 9.3 via INTRAVENOUS

## 2018-01-26 ENCOUNTER — Telehealth: Payer: Self-pay | Admitting: Gynecologic Oncology

## 2018-01-26 NOTE — Telephone Encounter (Signed)
Faxed medical records to Fredric Dine with [facility] @ [phone] for 06/09/17 with Dr. Denman George.  Release TR#[id number]

## 2018-01-27 ENCOUNTER — Inpatient Hospital Stay: Payer: Medicare Other

## 2018-01-27 ENCOUNTER — Inpatient Hospital Stay: Payer: Medicare Other | Attending: Hematology

## 2018-01-27 ENCOUNTER — Inpatient Hospital Stay (HOSPITAL_BASED_OUTPATIENT_CLINIC_OR_DEPARTMENT_OTHER): Payer: Medicare Other | Admitting: Nurse Practitioner

## 2018-01-27 ENCOUNTER — Encounter: Payer: Self-pay | Admitting: Nurse Practitioner

## 2018-01-27 VITALS — BP 133/68 | HR 72 | Temp 97.9°F | Resp 17

## 2018-01-27 DIAGNOSIS — Z170 Estrogen receptor positive status [ER+]: Secondary | ICD-10-CM | POA: Diagnosis not present

## 2018-01-27 DIAGNOSIS — R7401 Elevation of levels of liver transaminase levels: Secondary | ICD-10-CM

## 2018-01-27 DIAGNOSIS — Z5112 Encounter for antineoplastic immunotherapy: Secondary | ICD-10-CM | POA: Insufficient documentation

## 2018-01-27 DIAGNOSIS — C775 Secondary and unspecified malignant neoplasm of intrapelvic lymph nodes: Secondary | ICD-10-CM | POA: Diagnosis not present

## 2018-01-27 DIAGNOSIS — C541 Malignant neoplasm of endometrium: Secondary | ICD-10-CM | POA: Diagnosis not present

## 2018-01-27 DIAGNOSIS — C50211 Malignant neoplasm of upper-inner quadrant of right female breast: Secondary | ICD-10-CM | POA: Insufficient documentation

## 2018-01-27 DIAGNOSIS — I10 Essential (primary) hypertension: Secondary | ICD-10-CM | POA: Diagnosis not present

## 2018-01-27 DIAGNOSIS — Z79899 Other long term (current) drug therapy: Secondary | ICD-10-CM | POA: Diagnosis not present

## 2018-01-27 DIAGNOSIS — R740 Nonspecific elevation of levels of transaminase and lactic acid dehydrogenase [LDH]: Secondary | ICD-10-CM | POA: Insufficient documentation

## 2018-01-27 DIAGNOSIS — Z95828 Presence of other vascular implants and grafts: Secondary | ICD-10-CM

## 2018-01-27 DIAGNOSIS — G629 Polyneuropathy, unspecified: Secondary | ICD-10-CM | POA: Insufficient documentation

## 2018-01-27 LAB — COMPREHENSIVE METABOLIC PANEL
ALBUMIN: 3.8 g/dL (ref 3.5–5.0)
ALT: 143 U/L — ABNORMAL HIGH (ref 0–55)
ANION GAP: 7 (ref 3–11)
AST: 85 U/L — ABNORMAL HIGH (ref 5–34)
Alkaline Phosphatase: 51 U/L (ref 40–150)
BUN: 12 mg/dL (ref 7–26)
CO2: 28 mmol/L (ref 22–29)
Calcium: 9.3 mg/dL (ref 8.4–10.4)
Chloride: 102 mmol/L (ref 98–109)
Creatinine, Ser: 0.83 mg/dL (ref 0.60–1.10)
GFR calc Af Amer: >60 mL/min (ref >60)
GFR calc non Af Amer: >60 mL/min (ref >60)
GLUCOSE: 126 mg/dL (ref 70–140)
Potassium: 3.4 mmol/L — ABNORMAL LOW (ref 3.5–5.1)
SODIUM: 137 mmol/L (ref 136–145)
Total Bilirubin: 0.5 mg/dL (ref 0.2–1.2)
Total Protein: 7.1 g/dL (ref 6.4–8.3)

## 2018-01-27 LAB — CBC WITH DIFFERENTIAL/PLATELET
Basophils Absolute: 0 10*3/uL (ref 0.0–0.1)
Basophils Relative: 1 %
Eosinophils Absolute: 0 10*3/uL (ref 0.0–0.5)
Eosinophils Relative: 1 %
HEMATOCRIT: 37.8 % (ref 34.8–46.6)
Hemoglobin: 13.1 g/dL (ref 11.6–15.9)
LYMPHS ABS: 1.4 10*3/uL (ref 0.9–3.3)
Lymphocytes Relative: 40 %
MCH: 35.7 pg — AB (ref 25.1–34.0)
MCHC: 34.7 g/dL (ref 31.5–36.0)
MCV: 102.6 fL — ABNORMAL HIGH (ref 79.5–101.0)
MONO ABS: 0.4 10*3/uL (ref 0.1–0.9)
MONOS PCT: 11 %
NEUTROS ABS: 1.7 10*3/uL (ref 1.5–6.5)
Neutrophils Relative %: 47 %
Platelets: 149 10*3/uL (ref 145–400)
RBC: 3.68 MIL/uL — ABNORMAL LOW (ref 3.70–5.45)
RDW: 12.5 % (ref 11.2–14.5)
WBC: 3.6 10*3/uL — ABNORMAL LOW (ref 3.9–10.3)

## 2018-01-27 MED ORDER — SODIUM CHLORIDE 0.9% FLUSH
10.0000 mL | INTRAVENOUS | Status: DC | PRN
  Administered 2018-01-27: 10 mL
  Filled 2018-01-27: qty 10

## 2018-01-27 MED ORDER — DIPHENHYDRAMINE HCL 25 MG PO CAPS
50.0000 mg | ORAL_CAPSULE | Freq: Once | ORAL | Status: AC
  Administered 2018-01-27: 50 mg via ORAL

## 2018-01-27 MED ORDER — SODIUM CHLORIDE 0.9 % IV SOLN
Freq: Once | INTRAVENOUS | Status: AC
  Administered 2018-01-27: 11:00:00 via INTRAVENOUS

## 2018-01-27 MED ORDER — ACETAMINOPHEN 325 MG PO TABS
650.0000 mg | ORAL_TABLET | Freq: Once | ORAL | Status: AC
  Administered 2018-01-27: 650 mg via ORAL

## 2018-01-27 MED ORDER — DIPHENHYDRAMINE HCL 25 MG PO CAPS
ORAL_CAPSULE | ORAL | Status: AC
  Filled 2018-01-27: qty 2

## 2018-01-27 MED ORDER — HEPARIN SOD (PORK) LOCK FLUSH 100 UNIT/ML IV SOLN
500.0000 [IU] | Freq: Once | INTRAVENOUS | Status: AC | PRN
  Administered 2018-01-27: 500 [IU]
  Filled 2018-01-27: qty 5

## 2018-01-27 MED ORDER — SODIUM CHLORIDE 0.9% FLUSH
10.0000 mL | INTRAVENOUS | Status: DC | PRN
  Administered 2018-01-27: 10 mL via INTRAVENOUS
  Filled 2018-01-27: qty 10

## 2018-01-27 MED ORDER — TRASTUZUMAB CHEMO 150 MG IV SOLR
6.0000 mg/kg | Freq: Once | INTRAVENOUS | Status: AC
  Administered 2018-01-27: 525 mg via INTRAVENOUS
  Filled 2018-01-27: qty 25

## 2018-01-27 MED ORDER — ACETAMINOPHEN 325 MG PO TABS
ORAL_TABLET | ORAL | Status: AC
  Filled 2018-01-27: qty 2

## 2018-01-27 MED FILL — OLMESARTAN-HCTZ 20-12.5 MG: 20-12.5 | 30 days supply | Qty: 30 | Fill #1 | Status: TO

## 2018-01-27 NOTE — Progress Notes (Signed)
FMLA successfully faxed to [employer] at [phone]. Copy mailed to patient address on file.

## 2018-01-27 NOTE — Progress Notes (Signed)
Dawn [name]  [phone] [phone]  Clinic Follow up Note   Patient Care Team: [name], MD as PCP - Haskell Riling, MD as Consulting Physician (General Surgery) Truitt Merle, MD as Consulting Physician (Hematology) [name], MD as Attending Physician (Radiation Oncology) 01/27/2018  SUMMARY OF ONCOLOGIC HISTORY:   Malignant neoplasm of upper-inner quadrant of right breast in female, estrogen receptor positive ([place])   04/27/2017 Initial Biopsy    Diagnosis Breast, right, needle core biopsy INVASIVE DUCTAL CARCINOMA, GRADE 2 Microscopic Comment The neoplasm has intracellular mucin and signet ring cell features. Immunostains shows these cells are positive for ER,GATA3, ck7 and GCDFP, negative for ck20, cdx2, TTF-1 and pax8, The immunostaining pattern supports the neoplasm is breast primary.      04/27/2017 Receptors her2    Estrogen Receptor: 70%, POSITIVE, MODERATE STAINING INTENSITY Progesterone Receptor: 0%, NEGATIVE Proliferation Marker Ki67: 30% HER2 - **POSITIVE** RATIO OF HER2/CEP17 SIGNALS 2.91 AVERAGE HER2 COPY NUMBER PER CELL 7.28      04/27/2017 Initial Diagnosis    Malignant neoplasm of upper-inner quadrant of right breast in female, estrogen receptor positive ([place])      05/07/2017 Imaging    CT Chest W Contrast 05/07/17 IMPRESSION: Tiny well-defined fatty lesion on the pleura at the right lung base. The thinner slice collimation used for today's chest CT eliminates volume-averaging seen in the lesion on the prior exam and confirms that this is a diffusely fatty nodule. This is a benign finding and likely represents a tiny lipoma. No defect in the hemidiaphragm evident to suggest tiny diaphragmatic hernia. Pulmonary hamartoma a consideration although the lack of soft tissue components makes this less likely.      05/15/2017 Genetic Testing    Patient had genetic testing due to a personal history of  breast cancer and uterine cancer as well as a family history of cancer. The Multi-Cancer panel was ordered. The Multi-Cancer Panel offered by [facility] includes sequencing and/or deletion duplication testing of the following 83 genes: ALK, APC, ATM, AXIN2,BAP1,  BARD1, BLM, BMPR1A, BRCA1, BRCA2, BRIP1, CASR, CDC73, CDH1, CDK4, CDKN1B, CDKN1C, CDKN2A (p14ARF), CDKN2A (p16INK4a), CEBPA, CHEK2, CTNNA1, DICER1, DIS3L2, EGFR (c.2369C>T, p.Thr790Met variant only), EPCAM (Deletion/duplication testing only), FH, FLCN, GATA2, GPC3, GREM1 (Promoter region deletion/duplication testing only), HOXB13 (c.251G>A, p.Gly84Glu), HRAS, KIT, MAX, MEN1, MET, MITF (c.952G>A, p.Glu318Lys variant only), MLH1, MSH2, MSH3, MSH6, MUTYH, NBN, NF1, NF2, NTHL1, PALB2, PDGFRA, PHOX2B, PMS2, POLD1, POLE, POT1, PRKAR1A, PTCH1, PTEN, RAD50, RAD51C, RAD51D, RB1, RECQL4, RET, RUNX1, SDHAF2, SDHA (sequence changes only), SDHB, SDHC, SDHD, SMAD4, SMARCA4, SMARCB1, SMARCE1, STK11, SUFU, TERC, TERT, TMEM127, TP53, TSC1, TSC2, VHL, WRN and WT1.   Results: No pathogenic mutations were identified. A VUS in the GATA2 gene c.1348G>A (p.Gly450Arg) was identified.  The date of this test report is 05/25/2017.        05/25/2017 Surgery    RIGHT BREAST LUMPECTOMY WITH RADIOACTIVE SEED AND  RIGHT AXILLARY SENTINEL LYMPH NODE BIOPSY and Pot placement by Dr. Excell Seltzer 05/25/17      05/25/2017 Pathology Results    Diagnosis 05/25/17 1. Breast, lumpectomy, right - INVASIVE DUCTAL CARCINOMA, GRADE II/III, SPANNING 2.2 CM. - DUCTAL CARCINOMA IN SITU, HIGH GRADE. - THE SURGICAL RESECTION MARGINS ARE NEGATIVE FOR CARCINOMA. - SEE ONCOLOGY TABLE BELOW. 2. Breast, excision, right additional superior margin - BENIGN FIBROADIPOSE TISSUE. - BENIGN SKELETAL MUSCLE. - SEE COMMENT. 3. Breast, excision, right additional lateral margin - BENIGN BREAST PARENCHYMA. - THERE IS NO EVIDENCE OF MALIGNANCY. - SEE COMMENT.  4. Breast, excision, chest wall margin - BENIGN  FIBROADIPOSE TISSUE. - THERE IS NO EVIDENCE OF MALIGNANCY. - SEE COMMENT. 5. Lymph node, sentinel, biopsy, right axillary - THERE IS NO EVIDENCE OF CARCINOMA IN 1 OF 1 LYMPH NODE (0/1). 6. Lymph node, sentinel, biopsy, right axillary - THERE IS NO EVIDENCE OF CARCINOMA IN 1 OF 1 LYMPH NODE (0/1). 7. Lymph node, sentinel, biopsy, right axillary - THERE IS NO EVIDENCE OF CARCINOMA IN 1 OF 1 LYMPH NODE (0/1).      06/26/2017 PET scan    PET  IMPRESSION: 1. Postoperative findings both in the right breast and in the anatomic pelvis, with associated low-grade activity considered to be postoperative in nature. No hypermetabolic adenopathy or hypermetabolic lesions are identified to suggest active metastatic disease/malignancy. 2. Other imaging findings of potential clinical significance: Aortic Atherosclerosis (ICD10-I70.0). Sigmoid colon diverticulosis. Biapical pleuroparenchymal scarring in the lungs. Small amount of free pelvic fluid in the cul-de-sac, likely postoperative.       07/01/2017 - 10/14/2017 Chemotherapy    Adjuvant TCH with Onpro every 3 weeks for 6 cycles starting on 07/01/17, Changed Taxol to abraxane with cycle 4 due to drug rash reaction, followed by Herceptin every 3 weeks for 6 months       11/01/2017 - 12/08/2017 Radiation Therapy    Radiation therapy to her right breast with [name]      11/04/2017 -  Chemotherapy    Maintenance Herceptin starting 11/04/17 and will comeplete her 12 months in 06/2018       12/23/2017 -  Anti-estrogen oral therapy    Adjuvant Letrozole 2.5 mg daily       12/30/2017 Imaging    CT CAP W Contrast 12/30/17 IMPRESSION: Increased size of 1.1 cm aorto-caval retroperitoneal lymph node. This could be reactive due to interval hysterectomy, however metastatic carcinoma cannot be excluded. Consider continued attention on short-term follow-up CT, or PET-CT scan for further evaluation.  Mild hepatic steatosis.      01/25/2018 PET scan     IMPRESSION: 1. Enlarging hypermetabolic aortocaval lymph node is worrisome for metastatic disease. 2. Probable postoperative seroma in the medial right breast, with associated mild inflammatory hypermetabolism.       Endometrial adenocarcinoma ([place])   05/07/2017 Initial Diagnosis    Endometrial adenocarcinoma ([place])      06/09/2017 Surgery    XI ROBOTIC ASSISTED TOTAL LAPOROSCOPIC HYSTERECTOMY WITH BILATERAL SALPINGO OOPHORECTOMY and SENTINEL NODE BIOPSY by Dr. Denman George 06/09/17      06/09/2017 Pathology Results    Diagnosis 06/09/17 1. Lymph node, sentinel, biopsy, right external iliac - METASTATIC ADENOCARCINOMA IN ONE LYMPH NODE (1/1). 2. Lymph node, sentinel, biopsy, left obturator - ONE BENIGN LYMPH NODE (0/1). 3. Lymph node, sentinel, biopsy, left external iliac - METASTATIC ADENOCARCINOMA IN ONE LYMPH NODE (1/1). 4. Uterus +/- tubes/ovaries, neoplastic ENDOMETRIUM: - ENDOMETRIAL ADENOCARCINOMA, 3.2 CM. - CARCINOMA INVADES INNER HALF OF MYOMETRIUM. 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 - CERVIX, BILATERAL FALLOPIAN TUBES AND BILATERAL OVARIES FREE OF TUMOR      08/18/2017 - 09/17/2017 Radiation Therapy    vaginal brachytherapy per [name] on starting 08/18/17     CURRENT THERAPY:  -Maintenance Herceptin starting 11/04/17 and will complete 12 months in 06/2018 -Adjuvant letrozole 2.5 mg daily started 12/23/17  INTERVAL HISTORY: Ms. Waites presents in the infusion room for follow up. She is here with her spouse. She reports she is doing well. She started working part time few days per week and it is going well.  She has intermittent stable neuropathy to her feet, more noticeable upon standing after prolonged sitting. Being mobile helps. She has occasional right breast pain at surgical site and feels a hard area to the right middle breast. She is concerned about the lymph node in question. She will see [facility] Rhodia [facility] at [facility] tomorrow for 2nd opinion.   REVIEW OF SYSTEMS:     Constitutional: Denies fevers, chills or abnormal weight loss Eyes: Denies blurriness of vision Ears, nose, mouth, throat, and face: Denies mucositis or sore throat Respiratory: Denies cough, dyspnea or wheezes Cardiovascular: Denies palpitation, chest discomfort or lower extremity swelling Gastrointestinal:  Denies nausea, vomiting, constipation, diarrhea, heartburn or change in bowel habits Skin: Denies abnormal skin rashes (+) occasional itching skin "bump" to arms  Lymphatics: Denies new lymphadenopathy or easy bruising Neurological:Denies new weaknesses (+) stable intermittent neuropathy to feet  Behavioral/Psych: Mood is stable, no new changes  Breast: (+) occasional right breast pain at surgical site (+) breast mass to right middle breast  All other systems were reviewed with the patient and are negative.  MEDICAL HISTORY:  Past Medical History:  Diagnosis Date  . Anemia    as a child.  . Arthritis   . Bilateral cataracts   . Bilateral leg cramps   . Cancer ([place])    skin  . Dyspnea   . Family history of bladder cancer   . Family history of colon cancer in father   . Fuchs' corneal dystrophy   . GERD (gastroesophageal reflux disease)   . Hearing loss    Right side 30%  . History of hiatal hernia    small size  . History of radiation therapy 08/21/17, 08/28/17,09/01/17, 09/11/17, 09/17/17   Vaginal cuff brachytherapy.   Marland Kitchen History of radiation therapy 11/11/17- 12/08/17   Right Breast treated to 40.05 Gy with 15 fx of 2.67 Gy and a boost of 10 Gy with 5 fx.   . Hyperlipidemia   . Hypertension   . IBS (irritable bowel syndrome)    hx of  . Malignant neoplasm of upper-inner quadrant of right female breast ([place])   . NAFL (nonalcoholic fatty liver)   . Obesity   . PONV (postoperative nausea and vomiting)   . 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 Uterine cancer ([place])    endometrial cancer  . Varices, gastric   . Vertigo     SURGICAL  HISTORY: Past Surgical History:  Procedure Laterality Date  . BREAST BIOPSY Right 04/27/2017  . BREAST LUMPECTOMY WITH RADIOACTIVE SEED AND SENTINEL LYMPH NODE BIOPSY Right 05/25/2017   Procedure: RIGHT BREAST LUMPECTOMY WITH RADIOACTIVE SEED AND  RIGHT AXILLARY SENTINEL LYMPH NODE BIOPSY;  Surgeon: Excell Seltzer, MD;  Location: [facility];  Service: General;  Laterality: Right;  . CATARACT EXTRACTION, BILATERAL    . COLONOSCOPY    . HYSTEROSCOPY W/D&C N/A 04/29/2017   Procedure: DILATATION AND CURETTAGE /HYSTEROSCOPY;  Surgeon: Linda Hedges, DO;  Location: [facility];  Service: Gynecology;  Laterality: N/A;  . PORTACATH PLACEMENT Left 05/25/2017   Procedure: INSERTION PORT-A-CATH;  Surgeon: Excell Seltzer, MD;  Location: [facility];  Service: General;  Laterality: Left;  . ROBOTIC ASSISTED TOTAL HYSTERECTOMY WITH BILATERAL SALPINGO OOPHERECTOMY N/A 06/09/2017   Procedure: XI ROBOTIC ASSISTED TOTAL LAPOROSCOPIC HYSTERECTOMY WITH BILATERAL SALPINGO OOPHORECTOMY;  Surgeon: Everitt Amber, MD;  Location: [facility];  Service: Gynecology;  Laterality: N/A;  . SENTINEL NODE BIOPSY N/A 06/09/2017   Procedure: Efraim Kaufmann  NODE BIOPSY;  Surgeon: Everitt Amber, MD;  Location: [facility];  Service: Gynecology;  Laterality: N/A;    I have reviewed the social history and family history with the patient and they are unchanged from previous note.  ALLERGIES:  is allergic to ciprofloxacin; crestor [rosuvastatin calcium]; fosamax [alendronate sodium]; penicillins; and taxol [paclitaxel].  MEDICATIONS:  Current Outpatient Medications  Medication Sig Dispense Refill  . acetaminophen (TYLENOL) 325 MG tablet Take 650 mg by mouth every 6 (six) hours as needed for mild pain or moderate pain.    . Ascorbic Acid (VITAMIN C) 1000 MG tablet Take 1,000 mg by mouth daily.    Marland Kitchen b complex vitamins capsule Take 1 capsule by mouth daily.    Marland Kitchen ibandronate (BONIVA) 150 MG tablet Take in the morning with a  full glass of water,on an empty stomach,do not take anything else by mouth or lie down for the next 30 min. 3 tablet 3  . letrozole (FEMARA) 2.5 MG tablet Take 1 tablet (2.5 mg total) by mouth daily. 30 tablet 2  . meclizine (ANTIVERT) 25 MG tablet Take 1-2 tablets (25-50 mg total) by mouth 3 (three) times daily as needed for dizziness. (Patient taking differently: Take 25-50 mg by mouth 3 (three) times daily as needed for dizziness (depends on dizziness if takes 1-2 tablets). ) 30 tablet 0  . moxifloxacin (VIGAMOX) 0.5 % ophthalmic solution   0  . Multiple Vitamin (MULTIVITAMIN) capsule Take 1 capsule by mouth daily.      Marland Kitchen olmesartan-hydrochlorothiazide (BENICAR HCT) 20-12.5 MG tablet Take 1 tablet by mouth daily. 90 tablet 2  . Pitavastatin Calcium (LIVALO) 2 MG TABS Take 1.5 tablets (3 mg total) by mouth every evening. Takes 1.5 tablet 135 tablet 3  . prednisoLONE acetate (PRED FORTE) 1 % ophthalmic suspension   0  . prochlorperazine (COMPAZINE) 10 MG tablet Take 1 tablet (10 mg total) by mouth every 6 (six) hours as needed (Nausea or vomiting). (Patient not taking: Reported on 01/13/2018) 30 tablet 1  . PROLENSA 0.07 % SOLN   0  . sodium chloride (MURO 128) 5 % ophthalmic ointment Place 1 application into both eyes 2 (two) times daily.      No current facility-administered medications for this visit.     PHYSICAL EXAMINATION: ECOG PERFORMANCE STATUS: 1 - Symptomatic but completely ambulatory  There were no vitals filed for this visit.  GENERAL:alert, no distress and comfortable SKIN: no rashes or significant lesions EYES: normal, Conjunctiva are pink and non-injected, sclera clear OROPHARYNX:no exudate, no erythema and lips, buccal mucosa, and tongue normal  LYMPH:  no palpable cervical, supraclavicular, or axillary lymphadenopathy LUNGS: clear to auscultation with normal breathing effort HEART: regular rate & rhythm and no murmurs and no lower extremity edema ABDOMEN:abdomen soft,  non-tender and normal bowel sounds Musculoskeletal:no cyanosis of digits and no clubbing  NEURO: alert & oriented x 3 with fluent speech, no focal motor/sensory deficits Breasts: inspection limited by confines of exam chair in infusion room and patient is dressed. Axillary incision is well healed, no adenopathy. There is a palpable area in the upper central breast spanning approximately 2 cm, which correlates with postop fluid collection in the medial right breast seen on PET.  PAC without erythema   LABORATORY DATA:  I have reviewed the data as listed CBC Latest Ref Rng & Units 01/27/2018 01/06/2018 12/16/2017  WBC 3.9 - 10.3 K/uL 3.6(L) 3.3(L) 3.0(L)  Hemoglobin 11.6 - 15.9 g/dL 13.1 13.2 13.2  Hematocrit 34.8 - 46.6 %  37.8 37.7 38.7  Platelets 145 - 400 K/uL 149 175 122(L)     CMP Latest Ref Rng & Units 01/27/2018 01/06/2018 12/16/2017  Glucose 70 - 140 mg/dL 126 103 109  BUN 7 - 26 mg/dL '12 11 9  '$ Creatinine 0.60 - 1.10 mg/dL 0.83 0.78 0.75  Sodium 136 - 145 mmol/L 137 135(L) 135(L)  Potassium 3.5 - 5.1 mmol/L 3.4(L) 3.3(L) 3.5  Chloride 98 - 109 mmol/L 102 99 100  CO2 22 - 29 mmol/L '28 25 27  '$ Calcium 8.4 - 10.4 mg/dL 9.3 9.6 9.5  Total Protein 6.4 - 8.3 g/dL 7.1 7.2 7.3  Total Bilirubin 0.2 - 1.2 mg/dL 0.5 0.4 0.5  Alkaline Phos 40 - 150 U/L 51 47 57  AST 5 - 34 U/L 85(H) 76(H) 80(H)  ALT 0 - 55 U/L 143(H) 143(H) 138(H)   PATHOLOGY FINDINGS:   Diagnosis 06/09/17 1. Lymph node, sentinel, biopsy, right external iliac - METASTATIC ADENOCARCINOMA IN ONE LYMPH NODE (1/1). 2. Lymph node, sentinel, biopsy, left obturator - ONE BENIGN LYMPH NODE (0/1). 3. Lymph node, sentinel, biopsy, left external iliac - METASTATIC ADENOCARCINOMA IN ONE LYMPH NODE (1/1). 4. Uterus +/- tubes/ovaries, neoplastic ENDOMETRIUM: - ENDOMETRIAL ADENOCARCINOMA, 3.2 CM. - CARCINOMA INVADES INNER HALF OF MYOMETRIUM. 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 - CERVIX, BILATERAL FALLOPIAN TUBES AND BILATERAL  OVARIES FREE OF TUMOR. Microscopic Comment 4. ONCOLOGY TABLE-UTERUS, CARCINOMA OR CARCINOSARCOMA Specimen: Uterus with bilateral fallopian tubes and ovaries, right and left external iliac nodes and left obturator lymph node. Procedure: Hysterectomy with bilateral salpingo-oophorectomy and lymph node biopsies. Lymph node sampling performed: Yes. Specimen integrity: Intact. Maximum tumor size: 3.2 cm. Histologic type: Mixed, endometrioid, clear cell and serous. Grade: III. Myometrial invasion: 0.8 cm where myometrium is 2.2 cm in thickness. Cervical stromal involvement: No. Extent of involvement of other organs: N/A. Lymph - vascular invasion: Present. Peritoneal washings: N/A. Lymph nodes: Examined: 3 Sentinel 1 of 3 FINAL for MANHATTAN, MCCUEN ([id number]) Microscopic Comment(continued) 0 Non-sentinel 3 Total Lymph nodes with metastasis: 2. Isolated tumor cells (< 0.2 mm): 0. Micrometastasis: (> 0.2 mm and < 2.0 mm): 0. Macrometastasis: (> 2.0 mm): 2. Extracapsular extension: No. TNM code: pT1a, pN1. FIGO Stage (based on pathologic findings, needs clinical correlation): IIIC1. Comments: The tumor is 3.2 cm in greatest dimension and invades the myometrium to a depth of 0.8 cm where the myometrium is 2.2 cm in thickness and there is lymphatic vascular involvement by tumor. 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 (JDP:ah 06/10/17)   Diagnosis 05/25/17 1. Breast, lumpectomy, right - INVASIVE DUCTAL CARCINOMA, GRADE II/III, SPANNING 2.2 CM. - DUCTAL CARCINOMA IN SITU, HIGH GRADE. - THE SURGICAL RESECTION MARGINS ARE NEGATIVE FOR CARCINOMA. - SEE ONCOLOGY TABLE BELOW. 2. Breast, excision, right additional superior margin - BENIGN FIBROADIPOSE TISSUE. - BENIGN SKELETAL MUSCLE. - SEE COMMENT. 3. Breast, excision, right additional lateral margin - BENIGN BREAST PARENCHYMA. - THERE IS NO EVIDENCE OF MALIGNANCY. - SEE COMMENT. 4. Breast, excision,  chest wall margin - BENIGN FIBROADIPOSE TISSUE. - THERE IS NO EVIDENCE OF MALIGNANCY. - SEE COMMENT. 5. Lymph node, sentinel, biopsy, right axillary - THERE IS NO EVIDENCE OF CARCINOMA IN 1 OF 1 LYMPH NODE (0/1). 6. Lymph node, sentinel, biopsy, right axillary - THERE IS NO EVIDENCE OF CARCINOMA IN 1 OF 1 LYMPH NODE (0/1). 7. Lymph node, sentinel, biopsy, right axillary - THERE IS NO EVIDENCE OF CARCINOMA IN 1 OF 1 LYMPH NODE (0/1). Microscopic Comment 1. BREAST, INVASIVE  TUMOR Procedure: Seed localized lumpectomy, additional superior, lateral, and chest wall margin resections and axillary lymph node resections. Laterality: Right. Tumor Size: 2.2 cm (gross measurement). Histologic Type: Ductal. Microscopic Comment(continued) Grade: II. Tubular Differentiation: 2. Nuclear Pleomorphism: 2. Mitotic Count: 2. Ductal Carcinoma in Situ (DCIS): Present, high grade. Extent of Tumor: Confined to breast parenchyma. Margins: Greater than or equal to 0.2 cm to all margins. Regional Lymph Nodes: Number of Lymph Nodes Examined: 3. Number of Sentinel Lymph Nodes Examined: 3. Lymph Nodes with Macrometastases: 0. Lymph Nodes with Micrometastases: 0. Lymph Nodes with Isolated Tumor Cells: 0. Breast Prognostic Profile: Case [id number]. Estrogen Receptor: 70%, moderate. Progesterone Receptor: 0%. Her2: Amplification was detected. The ratio was 2.91. Ki-67: 30%. Best tumor block for sendout testing: 1A-1C. Pathologic Stage Classification (pTNM, AJCC 8th Edition): Primary Tumor (pT): pT2. Regional Lymph Nodes (pN): pN0. Distant Metastases (pM): pMX. 2. - 4. The surgical resection margin(s) of the specimen were inked and microscopically evaluated.  Diagnosis 04/29/2017 Endometrium, curettage - ADENOCARCINOMA. - SEE MICROSCOPIC DESCRIPTION. Microscopic Comment There are fragments of adenocarcinoma, some of which have cribriform architecture, and there is focal extracellular mucin.  Immunohistochemistry shows the tumor is positive with estrogen receptor, progesterone receptor with patchy positivity with carcinoembryonic antigen and p16. The tumor is negative with CD10 and vimentin. The morphology and immunophenotype are not definitive as to endocervical or endometrial origin. There are rare fragments with endometrial type stroma, which may indicate an endometrial origin although this is not definitive as to origin.  Diagnosis 04/27/2017 Breast, right, needle core biopsy INVASIVE DUCTAL CARCINOMA, GRADE 2 Microscopic Comment The neoplasm has intracellular mucin and signet ring cell features. Immunostains shows these cells are positive for ER, GATA3, ck7 and GCDFP, negative for ck20, cdx2, TTF-1 and pax8, The immunostaining pattern supports the neoplasm is breast primary. The Breast prognostic profile has been ordered. Results: IMMUNOHISTOCHEMICAL AND MORPHOMETRIC ANALYSIS PERFORMED MANUALLY Estrogen Receptor: 70%, POSITIVE, MODERATE STAINING INTENSITY Progesterone Receptor: 0%, NEGATIVE Proliferation Marker Ki67: 30% Results: HER2 - **POSITIVE** RATIO OF HER2/CEP17 SIGNALS 2.91 AVERAGE HER2 COPY NUMBER PER CELL 7.28  GENETICS 05/07/17    RADIOGRAPHIC STUDIES: I have personally reviewed the radiological images as listed and agreed with the findings in the report. No results found.   ASSESSMENT & PLAN: 67 y.o. 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 Malignant neoplasm of upper inner quadrant of right breast , Invasive ductal carcinoma, pT2N0M0, G2,  ER+/PR-/HER2+ 2. Endometrial adenocarcinoma with lymph node metastasis, pT1apN1M0, FIGO stage IIIc  3. Genetics - negative 4. HTN 5. Vertigo 6. Transaminitis  7. Bone health  8. Neuropathy   Ms. Westergard appears stable. She completed cycle 10 maintenance herceptin on 01/06/18 and continues daily letrozole. She is tolerating  treatment very well overall. She has a palpable area on right breast exam that is likely a post-op seroma. She will be due annual mammogram in August 2019, I ordered today. Labs reviewed. Continue maintenance herceptin q3 weeks for 1 year, to complete October 2019.   I reviewed PET scan with Ms. Somarriba, which shows an enlarging 10 mm aortocaval node that is worrisome for metastatic disease. Reviewed the case with Dr. Denman George who feel this is very suspicious for metastasis. Dr. Kathlene Cote in IR feels a percutaneous biopsy would be a high risk procedure. After further discussion involving [name], made plan to review in GYN Tumor board next week. Will f/u with the patient after. In the  meantime she plans to consult GYN Dr. Rhodia Albright at [facility] tomorrow 5/16.   Labs reviewed, mild transaminitis is stable. WBC remains low but trending up. I encouraged her to increase K in her diet. VS and weight stable.   PLAN: -Labs and imaging reviewed, proceed with cycle 10 herceptin; continue q3 weeks for 1 year. Complete 06/2018 -F/u with [name] in 3 weeks, with Yani Lal in 6 weeks  -Discuss PET and recommendations at GYN tumor board 02/01/18 -mammogram due 04/2018, ordered today   Orders Placed This Encounter  Procedures  . MM DIAG BREAST TOMO BILATERAL    [facility]    Standing Status:   Future    Standing Expiration Date:   01/28/2019    Order Specific Question:   Reason for Exam (SYMPTOM  OR DIAGNOSIS REQUIRED)    Answer:   right breast cancer s/p lumpectomy, chemoradiation, on letrozole and maintenance herceptin    Order Specific Question:   Preferred imaging location?    Answer:   External   All questions were answered. The patient knows to call the clinic with any problems, questions or concerns. No barriers to learning was detected.     Alla Feeling, NP 01/27/18

## 2018-01-27 NOTE — Progress Notes (Signed)
Per Lacie NP, ok to treat with AST of 85 and ALT of 143

## 2018-01-27 NOTE — Patient Instructions (Signed)
[facility] Discharge Instructions for Patients Receiving Chemotherapy  Today you received the following chemotherapy agents Herceptin  To help prevent nausea and vomiting after your treatment, we encourage you to take your nausea medication as directed   If you develop nausea and vomiting that is not controlled by your nausea medication, call the clinic.   BELOW ARE SYMPTOMS THAT SHOULD BE REPORTED IMMEDIATELY:  *FEVER GREATER THAN 100.5 F  *CHILLS WITH OR WITHOUT FEVER  NAUSEA AND VOMITING THAT IS NOT CONTROLLED WITH YOUR NAUSEA MEDICATION  *UNUSUAL SHORTNESS OF BREATH  *UNUSUAL BRUISING OR BLEEDING  TENDERNESS IN MOUTH AND THROAT WITH OR WITHOUT PRESENCE OF ULCERS  *URINARY PROBLEMS  *BOWEL PROBLEMS  UNUSUAL RASH Items with * indicate a potential emergency and should be followed up as soon as possible.  Feel free to call the clinic should you have any questions or concerns. The clinic phone number is [phone].  Please show the CHEMO ALERT CARD at check-in to the Emergency Department and triage nurse.   

## 2018-01-28 ENCOUNTER — Telehealth: Payer: Self-pay | Admitting: Oncology

## 2018-01-28 DIAGNOSIS — Z79899 Other long term (current) drug therapy: Secondary | ICD-10-CM | POA: Diagnosis not present

## 2018-01-28 DIAGNOSIS — Z90722 Acquired absence of ovaries, bilateral: Secondary | ICD-10-CM | POA: Diagnosis not present

## 2018-01-28 DIAGNOSIS — Z79811 Long term (current) use of aromatase inhibitors: Secondary | ICD-10-CM | POA: Diagnosis not present

## 2018-01-28 DIAGNOSIS — C541 Malignant neoplasm of endometrium: Secondary | ICD-10-CM | POA: Diagnosis not present

## 2018-01-28 DIAGNOSIS — Z90710 Acquired absence of both cervix and uterus: Secondary | ICD-10-CM | POA: Diagnosis not present

## 2018-01-28 DIAGNOSIS — Z881 Allergy status to other antibiotic agents status: Secondary | ICD-10-CM | POA: Diagnosis not present

## 2018-01-28 DIAGNOSIS — Z9089 Acquired absence of other organs: Secondary | ICD-10-CM | POA: Diagnosis not present

## 2018-01-28 DIAGNOSIS — Z888 Allergy status to other drugs, medicaments and biological substances status: Secondary | ICD-10-CM | POA: Diagnosis not present

## 2018-01-28 DIAGNOSIS — C779 Secondary and unspecified malignant neoplasm of lymph node, unspecified: Secondary | ICD-10-CM | POA: Diagnosis not present

## 2018-01-28 DIAGNOSIS — Z880 Allergy status to penicillin: Secondary | ICD-10-CM | POA: Diagnosis not present

## 2018-01-28 DIAGNOSIS — C50911 Malignant neoplasm of unspecified site of right female breast: Secondary | ICD-10-CM | POA: Diagnosis not present

## 2018-01-28 DIAGNOSIS — Z170 Estrogen receptor positive status [ER+]: Secondary | ICD-10-CM | POA: Diagnosis not present

## 2018-01-28 NOTE — Telephone Encounter (Signed)
MSI testing was ordered with Suanne Marker with [facility] for accession [id number] from 06/09/17.

## 2018-02-01 ENCOUNTER — Encounter: Payer: Self-pay | Admitting: Gynecologic Oncology

## 2018-02-01 ENCOUNTER — Telehealth: Payer: Self-pay

## 2018-02-01 NOTE — Telephone Encounter (Signed)
I called and spoke with Dr. Asa Saunas office at [facility]. Dawn [name] was in their office on 01/28/18, however the notes are not available in care everywhere. I called and spoke with their office requesting the notes be faxed to [name] in anticipation of her appointment on 02/03/18 with [name]. They verbalized that they will be faxed as soon as possible.

## 2018-02-01 NOTE — Progress Notes (Signed)
Gynecologic Oncology Multi-Disciplinary Disposition Conference Note  Date of the Conference: Feb 01, 2018  Patient Name: Dawn [name]  Referring Provider: Dr. Lynnette Caffey  Primary GYN Oncologist: Dr. 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 Disposition is to salvage radiation and chemotherapy.   This Multidisciplinary conference took place involving physicians from [place], [place], Radiation Oncology, Pathology, Radiology along with the Gynecologic Oncology Nurse Practitioner and RN.  Comprehensive assessment of the patient's malignancy, staging, need for surgery, chemotherapy, radiation therapy, and need for further testing were reviewed. Supportive measures, both inpatient and following discharge were also discussed. The recommended plan of care is documented. Greater than 35 minutes were spent correlating and coordinating this patient's care.

## 2018-02-02 ENCOUNTER — Telehealth: Payer: Self-pay | Admitting: Hematology

## 2018-02-02 ENCOUNTER — Telehealth: Payer: Self-pay

## 2018-02-02 NOTE — Telephone Encounter (Signed)
Spoke with patient regarding appointment for 5/22 per  sched msg 5/21

## 2018-02-02 NOTE — Telephone Encounter (Signed)
Requested Dr. Asa Saunas office note from 01/28/18 be faxed to [name] today as [name] is seeing this patient tomorrow.

## 2018-02-02 NOTE — Progress Notes (Signed)
GYN Location of Tumor / Histology:  10 mm Aortocaval node observed on PET scan from 01/25/18  Dawn [name] presented with symptoms of: enlarging node discovered on 01/25/18 PET scan  Biopsies revealed: None specifically performed on this Aortocaval node.  04/29/17 Diagnosis Endometrium, curettage - ADENOCARCINOMA.  Past/Anticipated interventions by Gyn/Onc surgery, if any:  Dr. Rhodia Albright 01/28/18, awaiting notes (care everywhere not complete)  Past/Anticipated interventions by medical oncology, if any:  01/27/18 -Dawn [name] appears stable. She completed cycle 10 maintenance herceptin on 01/06/18 and continues daily letrozole. She is tolerating treatment very well overall. She has a palpable area on right breast exam that is likely a post-op seroma. She will be due annual mammogram in August 2019, I ordered today. Labs reviewed. Continue maintenance herceptin q3 weeks for 1 year, to complete October 2019.  -I reviewed PET scan with Dawn [name], which shows an enlarging 10 mm aortocaval node that is worrisome for metastatic disease. Reviewed the case with Dr. Denman George who feel this is very suspicious for metastasis. Dr. Kathlene Cote in IR feels a percutaneous biopsy would be a high risk procedure. After further discussion involving [name], made plan to review in GYN Tumor board next week. Will f/u with the patient after. In the meantime she plans to consult GYN Dr. Rhodia Albright at [facility] tomorrow 5/16.  -Labs reviewed, mild transaminitis is stable. WBC remains low but trending up. I encouraged her to increase K in her diet. VS and weight stable.   PLAN: -Labs and imaging reviewed, proceed with cycle 10 herceptin; continue q3 weeks for 1 year. Complete 06/2018 -F/u with [name] in 3 weeks, with lacie in 6 weeks  -Discuss PET and recommendations at GYN tumor board 02/01/18 -mammogram due 04/2018, ordered today  Weight changes, if any: She denies.  Bowel/Bladder complaints, if any: No  Nausea/Vomiting, if  any: No  Pain issues, if any:  She reports some neck pain related to position and stress.   SAFETY ISSUES:  Prior radiation? Yes,      Breast Radiation treatment dates:11/11/17 - 12/08/17 Site/dose:Right breast treated to 40.05 Gy with 15 fx of 2.67 Gy and a boost of 10 Gy with 5 fx UTERINE TREATMENT DATES:08-21-17 to 09-17-17 SITE/DOSE:Vaginal cuff (upper 4cm) / 30 Gy in 5 fractions Rx to muocsal surface       Pacemaker/ICD? No  Possible current pregnancy? No  Is the patient on methotrexate? No  Current Complaints / other details:    BP (!) 129/58 (BP Location: Right Arm, Patient Position: Sitting, Cuff Size: Normal)   Pulse 77   Temp 97.7 F (36.5 C) (Oral)   Resp 20   Wt 188 lb 6.4 oz (85.5 kg)   SpO2 97%   BMI 33.37 kg/m

## 2018-02-02 NOTE — Progress Notes (Signed)
[place]  [phone] [phone]  Clinic Follow up Note   Patient Care Team: [name], MD as PCP - Haskell Riling, MD as Consulting Physician (General Surgery) Truitt Merle, MD as Consulting Physician (Hematology) [name], MD as Attending Physician (Radiation Oncology)   Date of Service:  02/03/2018  CHIEF COMPLAINTS:  Follow up right breast cancer and endometrial cancer     Malignant neoplasm of upper-inner quadrant of right breast in female, estrogen receptor positive ([place])   04/27/2017 Initial Biopsy    Diagnosis Breast, right, needle core biopsy INVASIVE DUCTAL CARCINOMA, GRADE 2 Microscopic Comment The neoplasm has intracellular mucin and signet ring cell features. Immunostains shows these cells are positive for ER,GATA3, ck7 and GCDFP, negative for ck20, cdx2, TTF-1 and pax8, The immunostaining pattern supports the neoplasm is breast primary.      04/27/2017 Receptors her2    Estrogen Receptor: 70%, POSITIVE, MODERATE STAINING INTENSITY Progesterone Receptor: 0%, NEGATIVE Proliferation Marker Ki67: 30% HER2 - **POSITIVE** RATIO OF HER2/CEP17 SIGNALS 2.91 AVERAGE HER2 COPY NUMBER PER CELL 7.28      04/27/2017 Initial Diagnosis    Malignant neoplasm of upper-inner quadrant of right breast in female, estrogen receptor positive ([place])      05/07/2017 Imaging    CT Chest W Contrast 05/07/17 IMPRESSION: Tiny well-defined fatty lesion on the pleura at the right lung base. The thinner slice collimation used for today's chest CT eliminates volume-averaging seen in the lesion on the prior exam and confirms that this is a diffusely fatty nodule. This is a benign finding and likely represents a tiny lipoma. No defect in the hemidiaphragm evident to suggest tiny diaphragmatic hernia. Pulmonary hamartoma a consideration although the lack of soft tissue components makes this less likely.      05/15/2017 Genetic  Testing    Patient had genetic testing due to a personal history of breast cancer and uterine cancer as well as a family history of cancer. The Multi-Cancer panel was ordered. The Multi-Cancer Panel offered by [facility] includes sequencing and/or deletion duplication testing of the following 83 genes: ALK, APC, ATM, AXIN2,BAP1,  BARD1, BLM, BMPR1A, BRCA1, BRCA2, BRIP1, CASR, CDC73, CDH1, CDK4, CDKN1B, CDKN1C, CDKN2A (p14ARF), CDKN2A (p16INK4a), CEBPA, CHEK2, CTNNA1, DICER1, DIS3L2, EGFR (c.2369C>T, p.Thr790Met variant only), EPCAM (Deletion/duplication testing only), FH, FLCN, GATA2, GPC3, GREM1 (Promoter region deletion/duplication testing only), HOXB13 (c.251G>A, p.Gly84Glu), HRAS, KIT, MAX, MEN1, MET, MITF (c.952G>A, p.Glu318Lys variant only), MLH1, MSH2, MSH3, MSH6, MUTYH, NBN, NF1, NF2, NTHL1, PALB2, PDGFRA, PHOX2B, PMS2, POLD1, POLE, POT1, PRKAR1A, PTCH1, PTEN, RAD50, RAD51C, RAD51D, RB1, RECQL4, RET, RUNX1, SDHAF2, SDHA (sequence changes only), SDHB, SDHC, SDHD, SMAD4, SMARCA4, SMARCB1, SMARCE1, STK11, SUFU, TERC, TERT, TMEM127, TP53, TSC1, TSC2, VHL, WRN and WT1.   Results: No pathogenic mutations were identified. A VUS in the GATA2 gene c.1348G>A (p.Gly450Arg) was identified.  The date of this test report is 05/25/2017.        05/25/2017 Surgery    RIGHT BREAST LUMPECTOMY WITH RADIOACTIVE SEED AND  RIGHT AXILLARY SENTINEL LYMPH NODE BIOPSY and Pot placement by Dr. Excell Seltzer 05/25/17      05/25/2017 Pathology Results    Diagnosis 05/25/17 1. Breast, lumpectomy, right - INVASIVE DUCTAL CARCINOMA, GRADE II/III, SPANNING 2.2 CM. - DUCTAL CARCINOMA IN SITU, HIGH GRADE. - THE SURGICAL RESECTION MARGINS ARE NEGATIVE FOR CARCINOMA. - SEE ONCOLOGY TABLE BELOW. 2. Breast, excision, right additional superior margin - BENIGN FIBROADIPOSE TISSUE. - BENIGN SKELETAL MUSCLE. - SEE COMMENT. 3. Breast, excision, right additional lateral  margin - BENIGN BREAST PARENCHYMA. - THERE IS NO EVIDENCE OF  MALIGNANCY. - SEE COMMENT. 4. Breast, excision, chest wall margin - BENIGN FIBROADIPOSE TISSUE. - THERE IS NO EVIDENCE OF MALIGNANCY. - SEE COMMENT. 5. Lymph node, sentinel, biopsy, right axillary - THERE IS NO EVIDENCE OF CARCINOMA IN 1 OF 1 LYMPH NODE (0/1). 6. Lymph node, sentinel, biopsy, right axillary - THERE IS NO EVIDENCE OF CARCINOMA IN 1 OF 1 LYMPH NODE (0/1). 7. Lymph node, sentinel, biopsy, right axillary - THERE IS NO EVIDENCE OF CARCINOMA IN 1 OF 1 LYMPH NODE (0/1).      06/26/2017 PET scan    PET  IMPRESSION: 1. Postoperative findings both in the right breast and in the anatomic pelvis, with associated low-grade activity considered to be postoperative in nature. No hypermetabolic adenopathy or hypermetabolic lesions are identified to suggest active metastatic disease/malignancy. 2. Other imaging findings of potential clinical significance: Aortic Atherosclerosis (ICD10-I70.0). Sigmoid colon diverticulosis. Biapical pleuroparenchymal scarring in the lungs. Small amount of free pelvic fluid in the cul-de-sac, likely postoperative.       07/01/2017 - 10/14/2017 Chemotherapy    Adjuvant TCH with Onpro every 3 weeks for 6 cycles starting on 07/01/17, Changed Taxol to abraxane with cycle 4 due to drug rash reaction, followed by Herceptin every 3 weeks for 6 months       11/01/2017 - 12/08/2017 Radiation Therapy    Radiation therapy to her right breast with [name]      11/04/2017 -  Chemotherapy    Maintenance Herceptin starting 11/04/17 and will comeplete her 12 months in 06/2018       12/23/2017 -  Anti-estrogen oral therapy    Adjuvant Letrozole 2.5 mg daily       12/30/2017 Imaging    CT CAP W Contrast 12/30/17 IMPRESSION: Increased size of 1.1 cm aorto-caval retroperitoneal lymph node. This could be reactive due to interval hysterectomy, however metastatic carcinoma cannot be excluded. Consider continued attention on short-term follow-up CT, or PET-CT scan  for further evaluation.  Mild hepatic steatosis.      01/25/2018 PET scan    IMPRESSION: 1. Enlarging hypermetabolic aortocaval lymph node is worrisome for metastatic disease. 2. Probable postoperative seroma in the medial right breast, with associated mild inflammatory hypermetabolism.       Endometrial adenocarcinoma ([place])   05/07/2017 Initial Diagnosis    Endometrial adenocarcinoma ([place])      06/09/2017 Surgery    XI ROBOTIC ASSISTED TOTAL LAPOROSCOPIC HYSTERECTOMY WITH BILATERAL SALPINGO OOPHORECTOMY and SENTINEL NODE BIOPSY by Dr. Denman George 06/09/17      06/09/2017 Pathology Results    Diagnosis 06/09/17 1. Lymph node, sentinel, biopsy, right external iliac - METASTATIC ADENOCARCINOMA IN ONE LYMPH NODE (1/1). 2. Lymph node, sentinel, biopsy, left obturator - ONE BENIGN LYMPH NODE (0/1). 3. Lymph node, sentinel, biopsy, left external iliac - METASTATIC ADENOCARCINOMA IN ONE LYMPH NODE (1/1). 4. Uterus +/- tubes/ovaries, neoplastic ENDOMETRIUM: - ENDOMETRIAL ADENOCARCINOMA, 3.2 CM. - CARCINOMA INVADES INNER HALF OF MYOMETRIUM. 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 - CERVIX, BILATERAL FALLOPIAN TUBES AND BILATERAL OVARIES FREE OF TUMOR      08/18/2017 - 09/17/2017 Radiation Therapy    vaginal brachytherapy per [name] on starting 08/18/17       HISTORY OF PRESENTING ILLNESS (05/06/2017):  QUANASIA DEFINO 67 y.o. female is here because of newly diagnosed breast cancer. Screening mammogram detected right breast mass on 04/20/2017. Ultrasound showed 1.5 cm mass in the 1:00 position. The axilla was negative on ultrasound.  Biopsy of the right breast was performed on 04/27/2017 revealing invasive ductal carcinoma, Grade 2, ER 70% / PR 0% / HER-2+ / Ki-67 30%.   Of note, she was also recently diagnosed with endometrial cancer. Biopsy of curettage endometrium on 04/29/2017 revealed adenocarcinoma. Immunohistology shows the tumor is positive with estrogen receptor, progesterone  receptor with patchy positivity with carcinoembryonic antigen and p16. The tumor is negative with CD10 and vimentin. The morphology and immunophenotype are not definitive as to endocervical or endometrial origin. There are rare fragments with endometrial type stroma, which may indicate an endometrial origin although this is not definitive as to origin.  The patient presents today in our multidisciplinary breast clinic. She is doing well overall. She went to her PCP with abdominal pain and vaginal bleeding which felt like she was having her period all the time. Then she presented to the ER with severe vertigo and heavy vaginal bleeding. She has been having heavy vaginal bleeding since mid-July. She does not have a history of anemia and associates it with recent bleeding. She has been taking oral iron. She took an aspirin daily but, has not been taking it lately. She had been taking Evista for one year and stopped taking it on her own. She was taking this medication for her bone density. She takes 2-3 Tylenol daily to manage the abdominal pain / cramping discomfort. She believes this pain is associated with passing clots. She takes a multivitamin daily. The patient is apprehensive about chemotherapy and is unsure if she would like to proceed with chemotherapy if it is recommended. She reports leg cramping. She does not take potassium supplements but, does try to eat bananas. States liver function is typically elevated and has been for about the last 2-3 years.   She has never smoked and doesn't drink alcohol. She did grow up with heavy smoking parents.   Father had colon cancer diagnosed at 62 years-old, and he died of bladder cancer. Paternal grandmother with some type of gynecologic cancer, age of onset in her mid-6s. Patient believes there was more cancer in her family in aunts and uncles however, there are no more living family members to discuss this with.   GYN HISTORY  Menarchal: 59 LMP: age of  59 Contraceptive: none  HRT: none  G2P2: She was 38 when she gave first live births.   CURRENT THERAPY:  -Maintenance Herceptin starting 11/04/17 and will complete 12 months in 06/2018 -Adjuvant letrozole 2.5 mg daily started 12/23/17  INTERVAL HISTORY:  Dawn [name] is here for a follow up. She presents to the clinic today accompanied by her husband. She saw Dr. Rhodia Albright last week and told her he can remove the uterine portion of her tumor and test it. He is looking for histology. She notes she is not sure of what her next step is but notes she is concerned with surgery. She is leaning toward watching her lymph node first. She plans to see [name] today for her opinion.    MEDICAL HISTORY:  Past Medical History:  Diagnosis Date  . Anemia    as a child.  . Arthritis   . Bilateral cataracts   . Bilateral leg cramps   . Cancer ([place])    skin  . Dyspnea   . Family history of bladder cancer   . Family history of colon cancer in father   . Fuchs' corneal dystrophy   . GERD (gastroesophageal reflux disease)   . Hearing loss    Right  side 30%  . History of hiatal hernia    small size  . History of radiation therapy 08/21/17, 08/28/17,09/01/17, 09/11/17, 09/17/17   Vaginal cuff brachytherapy.   Marland Kitchen History of radiation therapy 11/11/17- 12/08/17   Right Breast treated to 40.05 Gy with 15 fx of 2.67 Gy and a boost of 10 Gy with 5 fx.   . Hyperlipidemia   . Hypertension   . IBS (irritable bowel syndrome)    hx of  . Malignant neoplasm of upper-inner quadrant of right female breast ([place])   . NAFL (nonalcoholic fatty liver)   . Obesity   . PONV (postoperative nausea and vomiting)   . 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 Uterine cancer ([place])    endometrial cancer  . Varices, gastric   . Vertigo     SURGICAL HISTORY: Past Surgical History:  Procedure Laterality Date  . BREAST BIOPSY Right 04/27/2017  . BREAST LUMPECTOMY WITH RADIOACTIVE SEED AND  SENTINEL LYMPH NODE BIOPSY Right 05/25/2017   Procedure: RIGHT BREAST LUMPECTOMY WITH RADIOACTIVE SEED AND  RIGHT AXILLARY SENTINEL LYMPH NODE BIOPSY;  Surgeon: Excell Seltzer, MD;  Location: [facility];  Service: General;  Laterality: Right;  . CATARACT EXTRACTION, BILATERAL    . COLONOSCOPY    . HYSTEROSCOPY W/D&C N/A 04/29/2017   Procedure: DILATATION AND CURETTAGE /HYSTEROSCOPY;  Surgeon: Linda Hedges, DO;  Location: [facility];  Service: Gynecology;  Laterality: N/A;  . PORTACATH PLACEMENT Left 05/25/2017   Procedure: INSERTION PORT-A-CATH;  Surgeon: Excell Seltzer, MD;  Location: [facility];  Service: General;  Laterality: Left;  . ROBOTIC ASSISTED TOTAL HYSTERECTOMY WITH BILATERAL SALPINGO OOPHERECTOMY N/A 06/09/2017   Procedure: XI ROBOTIC ASSISTED TOTAL LAPOROSCOPIC HYSTERECTOMY WITH BILATERAL SALPINGO OOPHORECTOMY;  Surgeon: Everitt Amber, MD;  Location: [facility];  Service: Gynecology;  Laterality: N/A;  . SENTINEL NODE BIOPSY N/A 06/09/2017   Procedure: SENTINEL NODE BIOPSY;  Surgeon: Everitt Amber, MD;  Location: [facility];  Service: Gynecology;  Laterality: N/A;    SOCIAL HISTORY: Social History   Socioeconomic History  . Marital status: Married    Spouse name: Not on file  . Number of children: Not on file  . Years of education: Not on file  . Highest education level: Not on file  Occupational History    Employer: [employer]    Comment: Nurse, mental health research   Social Needs  . Financial resource strain: Not on file  . Food insecurity:    Worry: Not on file    Inability: Not on file  . Transportation needs:    Medical: Not on file    Non-medical: Not on file  Tobacco Use  . Smoking status: Never Smoker  . Smokeless tobacco: Never Used  Substance and Sexual Activity  . Alcohol use: Yes    Alcohol/week: 0.0 oz    Comment: <1/week wine or beer occasional  . Drug use: No  . Sexual activity: Yes    Birth control/protection:  Post-menopausal  Lifestyle  . Physical activity:    Days per week: Not on file    Minutes per session: Not on file  . Stress: Not on file  Relationships  . Social connections:    Talks on phone: Not on file    Gets together: Not on file    Attends religious service: Not on file    Active member of club or organization: Not on file    Attends meetings of clubs  or organizations: Not on file    Relationship status: Not on file  . Intimate partner violence:    Fear of current or ex partner: Not on file    Emotionally abused: Not on file    Physically abused: Not on file    Forced sexual activity: Not on file  Other Topics Concern  . Not on file  Social History Narrative   2-3 caffeine drinks per day. Regular exercise.  Walking 3 miles a day.     FAMILY HISTORY: Family History  Problem Relation Age of Onset  . Colon cancer Father 51  . Heart attack Father 54  . Prostate cancer Father        dx 55's  . Bladder Cancer Father 46  . Stroke Mother 31  . Diabetes Maternal Grandfather   . Kidney disease Maternal Grandfather   . Asthma Brother   . Cancer Paternal Grandmother 75       GYN cancer ( thinks ovarian, maybe uterine)  . Heart attack Maternal Grandmother 70  . Breast cancer Other 3  . Colon cancer Other 54  . Cancer Other        type unk, age dx unk    ALLERGIES:  is allergic to ciprofloxacin; crestor [rosuvastatin calcium]; fosamax [alendronate sodium]; penicillins; and taxol [paclitaxel].  MEDICATIONS:  Current Outpatient Medications  Medication Sig Dispense Refill  . acetaminophen (TYLENOL) 325 MG tablet Take 650 mg by mouth every 6 (six) hours as needed for mild pain or moderate pain.    . Ascorbic Acid (VITAMIN C) 1000 MG tablet Take 1,000 mg by mouth daily.    Marland Kitchen b complex vitamins capsule Take 1 capsule by mouth daily.    Marland Kitchen ibandronate (BONIVA) 150 MG tablet Take in the morning with a full glass of water,on an empty stomach,do not take anything else by mouth  or lie down for the next 30 min. 3 tablet 3  . letrozole (FEMARA) 2.5 MG tablet Take 1 tablet (2.5 mg total) by mouth daily. 30 tablet 2  . meclizine (ANTIVERT) 25 MG tablet Take 1-2 tablets (25-50 mg total) by mouth 3 (three) times daily as needed for dizziness. (Patient taking differently: Take 25-50 mg by mouth 3 (three) times daily as needed for dizziness (depends on dizziness if takes 1-2 tablets). ) 30 tablet 0  . Multiple Vitamin (MULTIVITAMIN) capsule Take 1 capsule by mouth daily.      Marland Kitchen olmesartan-hydrochlorothiazide (BENICAR HCT) 20-12.5 MG tablet Take 1 tablet by mouth daily. 90 tablet 2  . Pitavastatin Calcium (LIVALO) 2 MG TABS Take 1.5 tablets (3 mg total) by mouth every evening. Takes 1.5 tablet 135 tablet 3  . prednisoLONE acetate (PRED FORTE) 1 % ophthalmic suspension Place into the left eye 3 times daily.    . prochlorperazine (COMPAZINE) 10 MG tablet Take 1 tablet (10 mg total) by mouth every 6 (six) hours as needed (Nausea or vomiting). 30 tablet 1  . PROLENSA 0.07 % SOLN   0  . sodium chloride (MURO 128) 5 % ophthalmic ointment Place 1 application into both eyes 2 (two) times daily.      No current facility-administered medications for this visit.     REVIEW OF SYSTEMS:   Constitutional: Denies fevers, chills or abnormal night sweats  Eyes: Denies blurriness of vision, double vision or watery eyes Ears, nose, mouth, throat, and face: Denies mucositis or sore throat Respiratory: Denies cough, dyspnea or wheezes Cardiovascular: Denies palpitation, chest discomfort or lower extremity swelling Gastrointestinal:  Denies nausea, vomiting, constipation, diarrhea, heartburn or change in bowel habits (+) Occasional abdominal pain, tolerable.  Skin: Denies abnormal skin rashes.  Lymphatics: Denies new lymphadenopathy or easy bruising Musculoskeletal: Negative Neurological:Denies numbness, new weaknesses (+) vertigo (+) neuropathy in fingers and feet Behavioral/Psych: Mood is  stable, no new changes  All other systems were reviewed with the patient and are negative.  PHYSICAL EXAMINATION:  ECOG PERFORMANCE STATUS: 1 - Symptomatic but completely ambulatory  Vitals:   02/03/18 1250  BP: 135/71  Pulse: 77  Resp: 18  Temp: 97.8 F (36.6 C)  SpO2: 96%   Filed Weights   02/03/18 1250  Weight: 189 lb 11.2 oz (86 kg)     GENERAL:alert, no distress and comfortable SKIN: skin color, texture, turgor are normal, no rashes or significant lesions EYES: normal, conjunctiva are pink and non-injected, sclera clear OROPHARYNX:no exudate, no erythema and lips, buccal mucosa, and tongue normal  NECK: supple, thyroid normal size, non-tender, without nodularity LYMPH:  no palpable lymphadenopathy in the cervical, axillary or inguinal LUNGS: clear to auscultation with normal breathing effort HEART: regular rate & rhythm and no lower extremity edema (+) heart murmur in the aortic valve ABDOMEN:abdomen soft, non-tender and normal bowel sounds (+) s/p total hysterectomy and salpingo-oophorectomy, 4 horizontal laparoscopic surgical incisions at the level of the umbilicus, healing well  Musculoskeletal:no cyanosis of digits and no clubbing  PSYCH: alert & oriented x 3 with fluent speech NEURO: no focal motor/sensory deficits BREAST: Left breast shows no palpable mass or adenopathy. Right breast shows no palpable mass or adenopathy. (+) S/p right lumpectomy and radiation, Her surgical incision in right axilla and around the areola healed well without skin erythema or discharge. She has diffuse skin erythema over the right breast secondary to radiation   LABORATORY DATA:  I have reviewed the data as listed CBC Latest Ref Rng & Units 01/27/2018 01/06/2018 12/16/2017  WBC 3.9 - 10.3 K/uL 3.6(L) 3.3(L) 3.0(L)  Hemoglobin 11.6 - 15.9 g/dL 13.1 13.2 13.2  Hematocrit 34.8 - 46.6 % 37.8 37.7 38.7  Platelets 145 - 400 K/uL 149 175 122(L)    CMP Latest Ref Rng & Units 01/27/2018 01/06/2018  12/16/2017  Glucose 70 - 140 mg/dL 126 103 109  BUN 7 - 26 mg/dL '12 11 9  '$ Creatinine 0.60 - 1.10 mg/dL 0.83 0.78 0.75  Sodium 136 - 145 mmol/L 137 135(L) 135(L)  Potassium 3.5 - 5.1 mmol/L 3.4(L) 3.3(L) 3.5  Chloride 98 - 109 mmol/L 102 99 100  CO2 22 - 29 mmol/L '28 25 27  '$ Calcium 8.4 - 10.4 mg/dL 9.3 9.6 9.5  Total Protein 6.4 - 8.3 g/dL 7.1 7.2 7.3  Total Bilirubin 0.2 - 1.2 mg/dL 0.5 0.4 0.5  Alkaline Phos 40 - 150 U/L 51 47 57  AST 5 - 34 U/L 85(H) 76(H) 80(H)  ALT 0 - 55 U/L 143(H) 143(H) 138(H)   PATHOLOGY FINDINGS:  Diagnosis 06/09/17 1. Lymph node, sentinel, biopsy, right external iliac - METASTATIC ADENOCARCINOMA IN ONE LYMPH NODE (1/1). 2. Lymph node, sentinel, biopsy, left obturator - ONE BENIGN LYMPH NODE (0/1). 3. Lymph node, sentinel, biopsy, left external iliac - METASTATIC ADENOCARCINOMA IN ONE LYMPH NODE (1/1). 4. Uterus +/- tubes/ovaries, neoplastic ENDOMETRIUM: - ENDOMETRIAL ADENOCARCINOMA, 3.2 CM. - CARCINOMA INVADES INNER HALF OF MYOMETRIUM. 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 - CERVIX, BILATERAL FALLOPIAN TUBES AND BILATERAL OVARIES FREE OF TUMOR. Microscopic Comment 4. ONCOLOGY TABLE-UTERUS, CARCINOMA OR CARCINOSARCOMA Specimen: Uterus with bilateral fallopian tubes and ovaries, right and left  external iliac nodes and left obturator lymph node. Procedure: Hysterectomy with bilateral salpingo-oophorectomy and lymph node biopsies. Lymph node sampling performed: Yes. Specimen integrity: Intact. Maximum tumor size: 3.2 cm. Histologic type: Mixed, endometrioid, clear cell and serous. Grade: III. Myometrial invasion: 0.8 cm where myometrium is 2.2 cm in thickness. Cervical stromal involvement: No. Extent of involvement of other organs: N/A. Lymph - vascular invasion: Present. Peritoneal washings: N/A. Lymph nodes: Examined: 3 Sentinel 1 of 3 FINAL for MARSHEILA, ALEJO ([id number]) Microscopic Comment(continued) 0 Non-sentinel 3 Total Lymph  nodes with metastasis: 2. Isolated tumor cells (< 0.2 mm): 0. Micrometastasis: (> 0.2 mm and < 2.0 mm): 0. Macrometastasis: (> 2.0 mm): 2. Extracapsular extension: No. TNM code: pT1a, pN1. FIGO Stage (based on pathologic findings, needs clinical correlation): IIIC1. Comments: The tumor is 3.2 cm in greatest dimension and invades the myometrium to a depth of 0.8 cm where the myometrium is 2.2 cm in thickness and there is lymphatic vascular involvement by tumor. 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 (JDP:ah 06/10/17)   Diagnosis 05/25/17 1. Breast, lumpectomy, right - INVASIVE DUCTAL CARCINOMA, GRADE II/III, SPANNING 2.2 CM. - DUCTAL CARCINOMA IN SITU, HIGH GRADE. - THE SURGICAL RESECTION MARGINS ARE NEGATIVE FOR CARCINOMA. - SEE ONCOLOGY TABLE BELOW. 2. Breast, excision, right additional superior margin - BENIGN FIBROADIPOSE TISSUE. - BENIGN SKELETAL MUSCLE. - SEE COMMENT. 3. Breast, excision, right additional lateral margin - BENIGN BREAST PARENCHYMA. - THERE IS NO EVIDENCE OF MALIGNANCY. - SEE COMMENT. 4. Breast, excision, chest wall margin - BENIGN FIBROADIPOSE TISSUE. - THERE IS NO EVIDENCE OF MALIGNANCY. - SEE COMMENT. 5. Lymph node, sentinel, biopsy, right axillary - THERE IS NO EVIDENCE OF CARCINOMA IN 1 OF 1 LYMPH NODE (0/1). 6. Lymph node, sentinel, biopsy, right axillary - THERE IS NO EVIDENCE OF CARCINOMA IN 1 OF 1 LYMPH NODE (0/1). 7. Lymph node, sentinel, biopsy, right axillary - THERE IS NO EVIDENCE OF CARCINOMA IN 1 OF 1 LYMPH NODE (0/1). Microscopic Comment 1. BREAST, INVASIVE TUMOR Procedure: Seed localized lumpectomy, additional superior, lateral, and chest wall margin resections and axillary lymph node resections. Laterality: Right. Tumor Size: 2.2 cm (gross measurement). Histologic Type: Ductal. Microscopic Comment(continued) Grade: II. Tubular Differentiation: 2. Nuclear Pleomorphism: 2. Mitotic Count: 2. Ductal Carcinoma  in Situ (DCIS): Present, high grade. Extent of Tumor: Confined to breast parenchyma. Margins: Greater than or equal to 0.2 cm to all margins. Regional Lymph Nodes: Number of Lymph Nodes Examined: 3. Number of Sentinel Lymph Nodes Examined: 3. Lymph Nodes with Macrometastases: 0. Lymph Nodes with Micrometastases: 0. Lymph Nodes with Isolated Tumor Cells: 0. Breast Prognostic Profile: Case [id number]. Estrogen Receptor: 70%, moderate. Progesterone Receptor: 0%. Her2: Amplification was detected. The ratio was 2.91. Ki-67: 30%. Best tumor block for sendout testing: 1A-1C. Pathologic Stage Classification (pTNM, AJCC 8th Edition): Primary Tumor (pT): pT2. Regional Lymph Nodes (pN): pN0. Distant Metastases (pM): pMX. 2. - 4. The surgical resection margin(s) of the specimen were inked and microscopically evaluated.  Diagnosis 04/29/2017 Endometrium, curettage - ADENOCARCINOMA. - SEE MICROSCOPIC DESCRIPTION. Microscopic Comment There are fragments of adenocarcinoma, some of which have cribriform architecture, and there is focal extracellular mucin. Immunohistochemistry shows the tumor is positive with estrogen receptor, progesterone receptor with patchy positivity with carcinoembryonic antigen and p16. The tumor is negative with CD10 and vimentin. The morphology and immunophenotype are not definitive as to endocervical or endometrial origin. There are rare fragments with endometrial type stroma, which may indicate an endometrial origin although this is  not definitive as to origin.  Diagnosis 04/27/2017 Breast, right, needle core biopsy INVASIVE DUCTAL CARCINOMA, GRADE 2 Microscopic Comment The neoplasm has intracellular mucin and signet ring cell features. Immunostains shows these cells are positive for ER, GATA3, ck7 and GCDFP, negative for ck20, cdx2, TTF-1 and pax8, The immunostaining pattern supports the neoplasm is breast primary. The Breast prognostic profile has been  ordered. Results: IMMUNOHISTOCHEMICAL AND MORPHOMETRIC ANALYSIS PERFORMED MANUALLY Estrogen Receptor: 70%, POSITIVE, MODERATE STAINING INTENSITY Progesterone Receptor: 0%, NEGATIVE Proliferation Marker Ki67: 30% Results: HER2 - **POSITIVE** RATIO OF HER2/CEP17 SIGNALS 2.91 AVERAGE HER2 COPY NUMBER PER CELL 7.28  GENETICS 05/07/17   PROCEDURES  ECHO 11/30/17 Impressions: - Normal LV size and systolic function, EF 19-50%. Strain as above.   Normal RV size and systolic function. No significant valvular   abnormalities.  ECHO 08/27/17  Impressions: - Normal LV size and systolic function, EF 93-26%. Strain as above.   Mildly dilated RV with normal systolic function.  ECHO 05/07/17 Impressions: - Normal LV size with mild LV hypertrophy. EF 65-70%, vigorous   systolic function. Strain as above. Normal RV size and systolic   function. No significant valvular abnormalities.  RADIOGRAPHIC STUDIES: I have personally reviewed the radiological images as listed and agreed with the findings in the report. Nm Pet Image Restag (ps) Skull Base To Thigh  Result Date: 01/25/2018 CLINICAL DATA:  Subsequent treatment strategy for endometrial cancer. EXAM: NUCLEAR MEDICINE PET SKULL BASE TO THIGH TECHNIQUE: 9.3 mCi F-18 FDG was injected intravenously. Full-ring PET imaging was performed from the skull base to thigh after the radiotracer. CT data was obtained and used for attenuation correction and anatomic localization. Fasting blood glucose: 111 mg/dl COMPARISON:  CT chest abdomen pelvis 12/30/2017 and PET 06/26/2017. FINDINGS: Mediastinal blood pool activity: SUV max 3.0 NECK: No hypermetabolic lymph nodes in the neck. Incidental CT findings: None. CHEST: Postoperative fluid collection in the medial right breast measures 2.5 cm and has mild associated hypermetabolism, SUV max 4.0. No hypermetabolic mediastinal, hilar or axillary lymph nodes. No hypermetabolic pulmonary nodules. Incidental CT  findings: Left IJ Port-A-Cath terminates in the low SVC. Heart is mildly enlarged. No pericardial or pleural effusion. ABDOMEN/PELVIS: A hazy aortocaval lymph node (CT image 130) measures 10 mm, enlarged from 6 mm on 06/26/2017, with an SUV max of 3.7. No additional hypermetabolic lymph nodes. No abnormal hypermetabolism in the liver, adrenal glands, spleen or pancreas. Incidental CT findings: None. SKELETON: No abnormal osseous hypermetabolism. Incidental CT findings: none IMPRESSION: 1. Enlarging hypermetabolic aortocaval lymph node is worrisome for metastatic disease. 2. Probable postoperative seroma in the medial right breast, with associated mild inflammatory hypermetabolism. Electronically Signed   By: Lorin Picket M.D.   On: 01/25/2018 14:48    ASSESSMENT & PLAN:  67 y.o. 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 Malignant neoplasm of upper inner quadrant of right breast , Invasive ductal carcinoma, pT2N0M0, G2,  ER+/PR-/HER2+ -She underwent right breast lumpectomy with sentinel lymph node biopsy with Dr. Excell Seltzer on 05/25/2017 with port placement -Pathology confirmed invasive ductal carcinoma, grade 2, spanning 2.2 cm, margins were clear, no positive lymph nodes, ER positive, PR negative, HER-2 positive. I reviewed this with the patient previously -We previously discussed the risk of cancer recurrence after completed surgical resection.  Due to the HER-2 positive disease, she is at moderate to high risk  I recommended adjuvant chemotherapy at that time. The regiment was selected to cover  for endometrial cancer also. -She completed adjuvant TCH every 3 weeks with Onpro from 07/01/17-10/14/17. I changed Taxol to abraxane with cycle 4 due to drug rash reaction. She tolerated relatively well besides the rash and Grade 1 neuropathy. I encouraged her to take a B complex vitamin.  -She is currently on Herceptin maintenance  therapy began on 11/04/17. Plan for a total of 12 months. Will complete in Oct 2019.  -She started adjuvant radiation therapy on her breast on 11/01/17 with [name] and completed on 12/08/17. -She started Letrozole on 12/23/17 and is tolerating well with no present side effects at this time.  -Will continue breast cancer surveillance    2. Endometrial adenocarcinoma with lymph node metastasis, pT1apN1M0, FIGO stage IIIc  -She underwent a total hysterectomy with salpingo-oophorectomy  by Dr. Denman George on 06/09/17 -We previously discussed her surgical pathology. she has high risk features including lymphovascular involvement, nodal positive disease, and mixed histology with endometrioid, clear cell, and serous carcinoma  -We previously discussed the high risk of cancer recurrence after surgery extensively, and I strongly encouraged her to consider adjuvant chemotherapy to reduce her risk of recurrence. -She completed Taxol/carbo (taxol changed to abraxane) every 3 weeks x6 cycles on 10/14/17 and adjuvant vaginal brachytherapy from 08/18/17-09/17/17.  She tolerated well overall.  She experiences mild vaginal bleeding when using dilator as instructed per rad onc. -We will continue cancer surveillance, repeat CT scan every 6-12 months, up to 5 years -Her 12/30/17 CT scan showed 1.1 cm aorta-caval retroperitoneal lymph node which is borderline reactive. I previously recommended to watch this closely with another scan in 2 months.    3. Enlarged aortocaval node  -We discussed her CT CAP from 12/30/17 which shows 1.1 cm aortocaval retroperitoneal lymph node which is borderline enlarged, likely reactive, but metastatic disease is not ruled out.  This note was 6 mm and not hypermetabolic on the PET scan in October 2018. -We subsequently obtained a PET scan on Jan 25, 2018, which showed the aortocaval node is stable on size 10 mm, with SUV 3.7.  No additional hypermetabolic lymph nodes. -Her case was discussed in GYN  tumor board earlier this week. Dr. Denman George feels this very likely represents metastasis from her endometrial cancer. Dr. Landry Corporal also feels this is more likely cancerous than benign. Dr. Kathlene Cote in IR feels a percutaneous biopsy would be a high risk procedure and it may not be feasible. Tumor board recommends radiation and chemotherapy for presumed local recurrence, even without tissue confirmation. -she went to [facility] and saw Dr. Rhodia Albright, who does not strongly feel the node is highly suspicious for recurrence and recommend a follow up CT in 2-3 months. -I have requested her original endometrial cancer tissue to be tested for MSI, to see if she is eligible for immunotherapy. Although her genetic test has ruled out Lynch Syndrome.  -we had a lengthy discussion about management. The first option is more conservative way which is to watch this lymph node, repeat scan in 1-2 months and If this grows further then  this is more than likely cancer and she would proceed with treatment at that time. Her second option is to start with radiation without biopsy. 3rd option is to start with more agressive with sequential of chemo (such as Adriamycin) and radiation without lymph node biopsy. I reviewed risk and benefits with patient for all her options in details.  -she is on herceptin therapy, we discussed the potential side effects from vancomycin, especially heart failure, in combination  with Herceptin.  She will need to follow-up with her cardiologist closely. -I encouraged patient to follow-up with Dr. Denman George for more discussion.  She is scheduled to see radiation oncologist [name] today. I also encouraged her to get a third opinion if she would like more input. She will take think about this.  -F/u in 2 weeks  4. Genetics -Given her personal history of breast and endometrial cancer, and a family history of malignancy, she will be referred to see a genetic counselor for genetic testing to ruled out cancer  syndromes. -Genetics results were negative   5. HTN -We monitored her Hypertension while on chemo, she previously held HCTZ on days of chemo -F/u with PCP  6.  Transaminitis  - LFTs are chronically elevated due to fatty liver disease -We monitored her liver functions closely while on chemo, stable overall  -Will continue monitoring    7. Bone Health  -I discussed that aromatase inhibitors can potentially weaken the bone. I encouraged her to be active and to take a Vit D and Ca supplement.  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 I discussed this can take 1-2 years to recover.  -She will continue B complex. I recommend Neurontin for her tingling and pain. I discussed side effects. She would like to hold off for now.  -I encouraged her to remain active with exercise.   Follow-up: -Continue Letrozole  -Lab, flush, f/u and Herceptin in 2 weeks  -she will think about the options we discussed today and call me back if she decides to take chemo and or radiation    No orders of the defined types were placed in this encounter.   Pt and her husband had many questions. All questions were answered. The patient knows to call the clinic with any problems, questions or concerns. I spent 30 minutes counseling the patient face to face. The total time spent in the appointment was 40 minutes and more than 50% was on counseling.   This document serves as a record of services personally performed by Truitt Merle, MD. It was created on her behalf by Joslyn Devon, a trained medical scribe. The creation of this record is based on the scribe's personal observations and the provider's statements to them.    I have reviewed the above documentation for accuracy and completeness, and I agree with the above.    Truitt Merle  02/03/2018

## 2018-02-03 ENCOUNTER — Other Ambulatory Visit: Payer: Self-pay

## 2018-02-03 ENCOUNTER — Ambulatory Visit
Admission: RE | Admit: 2018-02-03 | Discharge: 2018-02-03 | Disposition: A | Discharge status: Home / Self Care | Payer: Medicare Other | Source: Ambulatory Visit | Attending: Radiation Oncology | Admitting: Radiation Oncology

## 2018-02-03 ENCOUNTER — Encounter: Payer: Self-pay | Admitting: Hematology

## 2018-02-03 ENCOUNTER — Telehealth: Payer: Self-pay | Admitting: Hematology

## 2018-02-03 ENCOUNTER — Inpatient Hospital Stay (HOSPITAL_BASED_OUTPATIENT_CLINIC_OR_DEPARTMENT_OTHER): Payer: Medicare Other | Admitting: Hematology

## 2018-02-03 VITALS — BP 135/71 | HR 77 | Temp 97.8°F | Resp 18 | Ht 63.0 in | Wt 189.7 lb

## 2018-02-03 VITALS — BP 129/58 | HR 77 | Temp 97.7°F | Resp 20 | Wt 188.4 lb

## 2018-02-03 DIAGNOSIS — M129 Arthropathy, unspecified: Secondary | ICD-10-CM | POA: Insufficient documentation

## 2018-02-03 DIAGNOSIS — Z9221 Personal history of antineoplastic chemotherapy: Secondary | ICD-10-CM | POA: Insufficient documentation

## 2018-02-03 DIAGNOSIS — Z809 Family history of malignant neoplasm, unspecified: Secondary | ICD-10-CM | POA: Insufficient documentation

## 2018-02-03 DIAGNOSIS — R1900 Intra-abdominal and pelvic swelling, mass and lump, unspecified site: Secondary | ICD-10-CM

## 2018-02-03 DIAGNOSIS — D649 Anemia, unspecified: Secondary | ICD-10-CM | POA: Diagnosis not present

## 2018-02-03 DIAGNOSIS — I10 Essential (primary) hypertension: Secondary | ICD-10-CM | POA: Diagnosis not present

## 2018-02-03 DIAGNOSIS — Z170 Estrogen receptor positive status [ER+]: Secondary | ICD-10-CM

## 2018-02-03 DIAGNOSIS — G629 Polyneuropathy, unspecified: Secondary | ICD-10-CM

## 2018-02-03 DIAGNOSIS — C541 Malignant neoplasm of endometrium: Secondary | ICD-10-CM

## 2018-02-03 DIAGNOSIS — Z8542 Personal history of malignant neoplasm of other parts of uterus: Secondary | ICD-10-CM | POA: Diagnosis not present

## 2018-02-03 DIAGNOSIS — K449 Diaphragmatic hernia without obstruction or gangrene: Secondary | ICD-10-CM | POA: Insufficient documentation

## 2018-02-03 DIAGNOSIS — Z923 Personal history of irradiation: Secondary | ICD-10-CM | POA: Insufficient documentation

## 2018-02-03 DIAGNOSIS — K760 Fatty (change of) liver, not elsewhere classified: Secondary | ICD-10-CM | POA: Insufficient documentation

## 2018-02-03 DIAGNOSIS — Z5112 Encounter for antineoplastic immunotherapy: Secondary | ICD-10-CM | POA: Diagnosis not present

## 2018-02-03 DIAGNOSIS — R740 Nonspecific elevation of levels of transaminase and lactic acid dehydrogenase [LDH]: Secondary | ICD-10-CM | POA: Diagnosis not present

## 2018-02-03 DIAGNOSIS — Z800 Family history of malignant neoplasm of digestive organs: Secondary | ICD-10-CM | POA: Insufficient documentation

## 2018-02-03 DIAGNOSIS — C50211 Malignant neoplasm of upper-inner quadrant of right female breast: Secondary | ICD-10-CM | POA: Diagnosis not present

## 2018-02-03 DIAGNOSIS — Z8052 Family history of malignant neoplasm of bladder: Secondary | ICD-10-CM | POA: Insufficient documentation

## 2018-02-03 DIAGNOSIS — C775 Secondary and unspecified malignant neoplasm of intrapelvic lymph nodes: Secondary | ICD-10-CM

## 2018-02-03 DIAGNOSIS — Z803 Family history of malignant neoplasm of breast: Secondary | ICD-10-CM | POA: Insufficient documentation

## 2018-02-03 DIAGNOSIS — R590 Localized enlarged lymph nodes: Secondary | ICD-10-CM | POA: Diagnosis not present

## 2018-02-03 DIAGNOSIS — M542 Cervicalgia: Secondary | ICD-10-CM | POA: Diagnosis not present

## 2018-02-03 DIAGNOSIS — Z8042 Family history of malignant neoplasm of prostate: Secondary | ICD-10-CM | POA: Diagnosis not present

## 2018-02-03 DIAGNOSIS — Z8719 Personal history of other diseases of the digestive system: Secondary | ICD-10-CM | POA: Diagnosis not present

## 2018-02-03 DIAGNOSIS — Z79811 Long term (current) use of aromatase inhibitors: Secondary | ICD-10-CM | POA: Diagnosis not present

## 2018-02-03 DIAGNOSIS — E785 Hyperlipidemia, unspecified: Secondary | ICD-10-CM | POA: Diagnosis not present

## 2018-02-03 DIAGNOSIS — M810 Age-related osteoporosis without current pathological fracture: Secondary | ICD-10-CM

## 2018-02-03 DIAGNOSIS — E669 Obesity, unspecified: Secondary | ICD-10-CM | POA: Insufficient documentation

## 2018-02-03 DIAGNOSIS — K589 Irritable bowel syndrome without diarrhea: Secondary | ICD-10-CM | POA: Diagnosis not present

## 2018-02-03 DIAGNOSIS — Z79899 Other long term (current) drug therapy: Secondary | ICD-10-CM | POA: Insufficient documentation

## 2018-02-03 DIAGNOSIS — R0602 Shortness of breath: Secondary | ICD-10-CM | POA: Diagnosis not present

## 2018-02-03 DIAGNOSIS — K219 Gastro-esophageal reflux disease without esophagitis: Secondary | ICD-10-CM | POA: Insufficient documentation

## 2018-02-03 DIAGNOSIS — Z853 Personal history of malignant neoplasm of breast: Secondary | ICD-10-CM | POA: Insufficient documentation

## 2018-02-03 NOTE — Telephone Encounter (Signed)
No 5/22 los.  

## 2018-02-03 NOTE — Progress Notes (Signed)
Radiation Oncology         [phone] ________________________________  Outpatient Re-Consultation  Name: Dawn [name] MRN: [id number]  Date: 02/03/2018  DOB: Sep 28, 1950  WU:[id number], Rene Kocher, MD  Truitt Merle, MD   REFERRING PHYSICIAN: Truitt Merle, MD  DIAGNOSIS:    ICD-10-CM   1. Endometrial cancer ([place]) C54.1   2. Malignant neoplasm of upper-inner quadrant of right breast in female, estrogen receptor positive ([place]) C50.211    Z17.0   3. 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 female with a history of right breast cancer and endometrial cancer, now with suspicious paraaortic lymph node. Since completing radiation therapy in March, she had a CT scan of the chest, abdomen, and pelvis on 12/30/17 which showed increased size of an aorto-caval retroperitoneal lymph node. PET scan on 01/25/18 showed the enlarging aortocaval lymph node measuring 10 mm that is worrisome for metastatic disease. She saw Dr. Rhodia Albright at [facility] last week for a second opinion. She and her husband present today to discuss treatment options.  On review of systems, the patient reports some neck pain related to position and stress. She reports continued neuropathy in her feet and hands from chemotherapy. She is overall feeling well and is back to working two days a week.   PREVIOUS RADIATION THERAPY: Yes  Breast Radiation Treatment Dates:11/11/2017-12/08/2017 Site/Dose:Right Breast / 40.05 Gy in 15 fractions followed by a boost of 10 Gy in 5 fractions Uterine Radiation Treatment Dates:08/21/2017-09/17/2017 Site/Dose:Vaginal cuff (upper 4cm) / 30 Gy in 5 fractions to mucosal  surface  PAST MEDICAL HISTORY:  has a past medical history of Anemia, Arthritis, Bilateral cataracts, Bilateral leg cramps, Cancer ([place]), Dyspnea, Family history of bladder cancer, Family history of colon cancer in father, Vira Agar' corneal dystrophy, GERD (gastroesophageal reflux disease), Hearing loss, History of hiatal hernia, History of radiation therapy (08/21/17, 08/28/17,09/01/17, 09/11/17, 09/17/17), History of radiation therapy (11/11/17- 12/08/17), Hyperlipidemia, Hypertension, IBS (irritable bowel syndrome), Malignant neoplasm of upper-inner quadrant of right female breast ([place]), NAFL (nonalcoholic fatty liver), Obesity, PONV (postoperative nausea and vomiting), Tuberculosis, Uterine cancer ([place]), Varices, gastric, and Vertigo.    PAST SURGICAL HISTORY: Past Surgical History:  Procedure Laterality Date  . BREAST BIOPSY Right 04/27/2017  . BREAST LUMPECTOMY WITH RADIOACTIVE SEED AND SENTINEL LYMPH NODE BIOPSY Right 05/25/2017   Procedure: RIGHT BREAST LUMPECTOMY WITH RADIOACTIVE SEED AND  RIGHT AXILLARY SENTINEL LYMPH NODE BIOPSY;  Surgeon: Excell Seltzer, MD;  Location: [place];  Service: General;  Laterality: Right;  . CATARACT EXTRACTION, BILATERAL    . COLONOSCOPY    . HYSTEROSCOPY W/D&C N/A 04/29/2017   Procedure: DILATATION AND CURETTAGE /HYSTEROSCOPY;  Surgeon: Linda Hedges, DO;  Location: [facility];  Service: Gynecology;  Laterality: N/A;  . PORTACATH PLACEMENT Left 05/25/2017   Procedure: INSERTION PORT-A-CATH;  Surgeon: Excell Seltzer, MD;  Location: [place];  Service: General;  Laterality: Left;  . ROBOTIC ASSISTED TOTAL HYSTERECTOMY WITH BILATERAL SALPINGO OOPHERECTOMY N/A 06/09/2017   Procedure: XI ROBOTIC ASSISTED TOTAL LAPOROSCOPIC HYSTERECTOMY WITH BILATERAL SALPINGO OOPHORECTOMY;  Surgeon: Everitt Amber, MD;  Location: [facility];  Service: Gynecology;  Laterality: N/A;  . SENTINEL NODE BIOPSY N/A 06/09/2017   Procedure: SENTINEL NODE BIOPSY;   Surgeon: Everitt Amber, MD;  Location: [facility];  Service: Gynecology;  Laterality: N/A;    FAMILY HISTORY: family history includes Asthma in her brother; Bladder Cancer (age of onset: 42) in her father; Breast cancer (age of onset: 75) in her other; Cancer in her other; Cancer (age of onset: 34) in her paternal grandmother; Colon cancer (age of onset: 67) in her father; Colon cancer (age of onset: 82) in her other; Diabetes in her maternal grandfather; Heart attack (age of onset: 81) in her father; Heart attack (age of onset: 9) in her maternal grandmother; Kidney disease in her maternal grandfather; Prostate cancer in her father; Stroke (age of onset: 36) in her mother.  SOCIAL HISTORY:  reports that she has never smoked. She has never used smokeless tobacco. She reports that she drinks alcohol. She reports that she does not use drugs.  ALLERGIES: Ciprofloxacin; Crestor [rosuvastatin calcium]; Fosamax [alendronate sodium]; Penicillins; and Taxol [paclitaxel]  MEDICATIONS:  Current Outpatient Medications  Medication Sig Dispense Refill  . acetaminophen (TYLENOL) 325 MG tablet Take 650 mg by mouth every 6 (six) hours as needed for mild pain or moderate pain.    . Ascorbic Acid (VITAMIN C) 1000 MG tablet Take 1,000 mg by mouth daily.    Marland Kitchen b complex vitamins capsule Take 1 capsule by mouth daily.    Marland Kitchen ibandronate (BONIVA) 150 MG tablet Take in the morning with a full glass of water,on an empty stomach,do not take anything else by mouth or lie down for the next 30 min. 3 tablet 3  . letrozole (FEMARA) 2.5 MG tablet Take 1 tablet (2.5 mg total) by mouth daily. 30 tablet 2  . meclizine (ANTIVERT) 25 MG tablet Take 1-2 tablets (25-50 mg total) by mouth 3 (three) times daily as needed for dizziness. (Patient taking differently: Take 25-50 mg by mouth 3 (three) times daily as needed for dizziness (depends on dizziness if takes 1-2 tablets). ) 30 tablet 0  . Multiple Vitamin (MULTIVITAMIN) capsule Take 1  capsule by mouth daily.      Marland Kitchen olmesartan-hydrochlorothiazide (BENICAR HCT) 20-12.5 MG tablet Take 1 tablet by mouth daily. 90 tablet 2  . Pitavastatin Calcium (LIVALO) 2 MG TABS Take 1.5 tablets (3 mg total) by mouth every evening. Takes 1.5 tablet 135 tablet 3  . prednisoLONE acetate (PRED FORTE) 1 % ophthalmic suspension Place into the left eye 3 times daily.    . prochlorperazine (COMPAZINE) 10 MG tablet Take 1 tablet (10 mg total) by mouth every 6 (six) hours as needed (Nausea or vomiting). 30 tablet 1  . PROLENSA 0.07 % SOLN   0  . sodium chloride (MURO 128) 5 % ophthalmic ointment Place 1 application into both eyes 2 (two) times daily.      No current facility-administered medications for this encounter.     REVIEW OF SYSTEMS:  As above  PHYSICAL EXAM: Alert and oriented, in no acute distress. Judgment and insight are intact. Affect is appropriate.  weight is 188 lb 6.4 oz (85.5 kg). Her oral temperature is 97.7 F (36.5 C). Her blood pressure is 129/58 (abnormal) and her pulse is 77. Her respiration is 20 and oxygen saturation is 97%.     ECOG = 1  0 - Asymptomatic (Fully active, able to carry on all predisease activities without restriction)  1 - Symptomatic but completely ambulatory (Restricted in physically strenuous activity but ambulatory and able to carry out work of a light or sedentary nature. For example, light housework, office work)  2 - Symptomatic, <50% in bed during the  day (Ambulatory and capable of all self care but unable to carry out any work activities. Up and about more than 50% of waking hours)  3 - Symptomatic, >50% in bed, but not bedbound (Capable of only limited self-care, confined to bed or chair 50% or more of waking hours)  4 - Bedbound (Completely disabled. Cannot carry on any self-care. Totally confined to bed or chair)  5 - Death   Eustace Pen MM, Creech RH, Tormey DC, et al. [phone]). "Toxicity and response criteria of the [facility]  Group". [place] Oncol. 5 (6): 649-55   LABORATORY DATA:  Lab Results  Component Value Date   WBC 3.6 (L) 01/27/2018   HGB 13.1 01/27/2018   HCT 37.8 01/27/2018   MCV 102.6 (H) 01/27/2018   PLT 149 01/27/2018   CMP     Component Value Date/Time   NA 137 01/27/2018 0858   NA 136 09/02/2017 0928   K 3.4 (L) 01/27/2018 0858   K 3.3 (L) 09/02/2017 0928   CL 102 01/27/2018 0858   CO2 28 01/27/2018 0858   CO2 27 09/02/2017 0928   GLUCOSE 126 01/27/2018 0858   GLUCOSE 96 09/02/2017 0928   BUN 12 01/27/2018 0858   BUN 9.0 09/02/2017 0928   CREATININE 0.83 01/27/2018 0858   CREATININE 0.8 09/02/2017 0928   CALCIUM 9.3 01/27/2018 0858   CALCIUM 9.4 09/02/2017 0928   PROT 7.1 01/27/2018 0858   PROT 7.2 09/02/2017 0928   ALBUMIN 3.8 01/27/2018 0858   ALBUMIN 3.8 09/02/2017 0928   AST 85 (H) 01/27/2018 0858   AST 96 (H) 09/02/2017 0928   ALT 143 (H) 01/27/2018 0858   ALT 144 (H) 09/02/2017 0928   ALKPHOS 51 01/27/2018 0858   ALKPHOS 57 09/02/2017 0928   BILITOT 0.5 01/27/2018 0858   BILITOT 0.57 09/02/2017 0928   GFRNONAA >60 01/27/2018 0858   GFRNONAA 81 06/04/2016 0839   GFRAA >60 01/27/2018 0858   GFRAA >89 06/04/2016 0839         RADIOGRAPHY: Nm Pet Image Restag (ps) Skull Base To Thigh  Result Date: 01/25/2018 CLINICAL DATA:  Subsequent treatment strategy for endometrial cancer. EXAM: NUCLEAR MEDICINE PET SKULL BASE TO THIGH TECHNIQUE: 9.3 mCi F-18 FDG was injected intravenously. Full-ring PET imaging was performed from the skull base to thigh after the radiotracer. CT data was obtained and used for attenuation correction and anatomic localization. Fasting blood glucose: 111 mg/dl COMPARISON:  CT chest abdomen pelvis 12/30/2017 and PET 06/26/2017. FINDINGS: Mediastinal blood pool activity: SUV max 3.0 NECK: No hypermetabolic lymph nodes in the neck. Incidental CT findings: None. CHEST: Postoperative fluid collection in the medial right breast measures 2.5 cm and has  mild associated hypermetabolism, SUV max 4.0. No hypermetabolic mediastinal, hilar or axillary lymph nodes. No hypermetabolic pulmonary nodules. Incidental CT findings: Left IJ Port-A-Cath terminates in the low SVC. Heart is mildly enlarged. No pericardial or pleural effusion. ABDOMEN/PELVIS: A hazy aortocaval lymph node (CT image 130) measures 10 mm, enlarged from 6 mm on 06/26/2017, with an SUV max of 3.7. No additional hypermetabolic lymph nodes. No abnormal hypermetabolism in the liver, adrenal glands, spleen or pancreas. Incidental CT findings: None. SKELETON: No abnormal osseous hypermetabolism. Incidental CT findings: none IMPRESSION: 1. Enlarging hypermetabolic aortocaval lymph node is worrisome for metastatic disease. 2. Probable postoperative seroma in the medial right breast, with associated mild inflammatory hypermetabolism. Electronically Signed   By: Lorin Picket M.D.   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  The patient was discussed at GYN tumor board here in [place], and there was concern that her paraaortic lymph node is likely to be a sign of recurrence, though it was felt that the risks of biopsy would be significant. Chemotherapy and radiation were recommended for salvage instead of surgery. The patient also presented to Dr. Rhodia Albright for a second opinion, and he recommended close observation with a CT scan in mid-July. If interval growth is present at that time, they would consider an IR-guided FNA of the lymph node. We cannot rule out that this node could be reactive instead of metastatic.   We spoke with Dr. Denman George on a conference call during our visit today who discussed the patient's treatment options in full. Dr. Denman George offered surgery but feels chemotherapy and radiation would be the better salvage option.   She also  discussed the option of observation and waiting to see if there is progression on July's CT scan but ultimately recommends that treatment be started now. The patient has reservations about undergoing chemotherapy due to heart risks.  At this point, I will contact [name] and Dr. Denman George as the patient is wondering if there are any other chemotherapy options that would pose less risk to her heart. We will then get that information back to the patient. She is going to think about her options which include observation with repeat scan in 2 months vs. surgical biopsy vs. chemotherapy and sequential radiation vs. radiation alone. I explained that if she pursues radiation I would favor treating the pelvic and paraaortic nodes which would all be at risk. She and I spoke about potential side effects that could occur with a 6 week regimen of treatment to these nodes. Side effects include but are not limited to: nausea, fatigue, dysuria, and diarrhea. No guarantees of treatment were given.   I spent 75 minutes face to face with the patient and more than 50% of that time was spent in counseling and/or coordination of care. __________________________________________   [name], MD  This document serves as a record of services personally performed by [name], MD. It was created on her behalf by Rae Lips, a trained medical scribe. The creation of this record is based on the scribe's personal observations and the provider's statements to them. This document has been checked and approved by the attending provider.

## 2018-02-05 ENCOUNTER — Encounter: Payer: Self-pay | Admitting: Radiation Oncology

## 2018-02-10 ENCOUNTER — Other Ambulatory Visit (HOSPITAL_COMMUNITY)
Admission: RE | Admit: 2018-02-10 | Discharge: 2018-02-10 | Disposition: A | Discharge status: Home / Self Care | Payer: Medicare Other | Source: Ambulatory Visit | Attending: Gynecologic Oncology | Admitting: Gynecologic Oncology

## 2018-02-10 DIAGNOSIS — C541 Malignant neoplasm of endometrium: Secondary | ICD-10-CM | POA: Diagnosis not present

## 2018-02-16 ENCOUNTER — Telehealth: Payer: Self-pay | Admitting: Family Medicine

## 2018-02-16 ENCOUNTER — Encounter: Payer: Self-pay | Admitting: Oncology

## 2018-02-16 NOTE — Progress Notes (Signed)
[place]  [phone] [phone]  Clinic Follow up Note   Patient Care Team: [name], MD as PCP - Haskell Riling, MD as Consulting Physician (General Surgery) Truitt Merle, MD as Consulting Physician (Hematology) [name], MD as Attending Physician (Radiation Oncology)   Date of Service:  02/17/2018  CHIEF COMPLAINTS:  Follow up right breast cancer and endometrial cancer   Oncology History   MSI high     Malignant neoplasm of upper-inner quadrant of right breast in female, estrogen receptor positive ([place])   04/27/2017 Initial Biopsy    Diagnosis Breast, right, needle core biopsy INVASIVE DUCTAL CARCINOMA, GRADE 2 Microscopic Comment The neoplasm has intracellular mucin and signet ring cell features. Immunostains shows these cells are positive for ER,GATA3, ck7 and GCDFP, negative for ck20, cdx2, TTF-1 and pax8, The immunostaining pattern supports the neoplasm is breast primary.      04/27/2017 Receptors her2    Estrogen Receptor: 70%, POSITIVE, MODERATE STAINING INTENSITY Progesterone Receptor: 0%, NEGATIVE Proliferation Marker Ki67: 30% HER2 - **POSITIVE** RATIO OF HER2/CEP17 SIGNALS 2.91 AVERAGE HER2 COPY NUMBER PER CELL 7.28      04/27/2017 Initial Diagnosis    Malignant neoplasm of upper-inner quadrant of right breast in female, estrogen receptor positive ([place])      05/07/2017 Imaging    CT Chest W Contrast 05/07/17 IMPRESSION: Tiny well-defined fatty lesion on the pleura at the right lung base. The thinner slice collimation used for today's chest CT eliminates volume-averaging seen in the lesion on the prior exam and confirms that this is a diffusely fatty nodule. This is a benign finding and likely represents a tiny lipoma. No defect in the hemidiaphragm evident to suggest tiny diaphragmatic hernia. Pulmonary hamartoma a consideration although the lack of soft tissue components makes this less  likely.      05/15/2017 Genetic Testing    Patient had genetic testing due to a personal history of breast cancer and uterine cancer as well as a family history of cancer. The Multi-Cancer panel was ordered. The Multi-Cancer Panel offered by [facility] includes sequencing and/or deletion duplication testing of the following 83 genes: ALK, APC, ATM, AXIN2,BAP1,  BARD1, BLM, BMPR1A, BRCA1, BRCA2, BRIP1, CASR, CDC73, CDH1, CDK4, CDKN1B, CDKN1C, CDKN2A (p14ARF), CDKN2A (p16INK4a), CEBPA, CHEK2, CTNNA1, DICER1, DIS3L2, EGFR (c.2369C>T, p.Thr790Met variant only), EPCAM (Deletion/duplication testing only), FH, FLCN, GATA2, GPC3, GREM1 (Promoter region deletion/duplication testing only), HOXB13 (c.251G>A, p.Gly84Glu), HRAS, KIT, MAX, MEN1, MET, MITF (c.952G>A, p.Glu318Lys variant only), MLH1, MSH2, MSH3, MSH6, MUTYH, NBN, NF1, NF2, NTHL1, PALB2, PDGFRA, PHOX2B, PMS2, POLD1, POLE, POT1, PRKAR1A, PTCH1, PTEN, RAD50, RAD51C, RAD51D, RB1, RECQL4, RET, RUNX1, SDHAF2, SDHA (sequence changes only), SDHB, SDHC, SDHD, SMAD4, SMARCA4, SMARCB1, SMARCE1, STK11, SUFU, TERC, TERT, TMEM127, TP53, TSC1, TSC2, VHL, WRN and WT1.   Results: No pathogenic mutations were identified. A VUS in the GATA2 gene c.1348G>A (p.Gly450Arg) was identified.  The date of this test report is 05/25/2017.        05/25/2017 Surgery    RIGHT BREAST LUMPECTOMY WITH RADIOACTIVE SEED AND  RIGHT AXILLARY SENTINEL LYMPH NODE BIOPSY and Pot placement by Dr. Excell Seltzer 05/25/17      05/25/2017 Pathology Results    Diagnosis 05/25/17 1. Breast, lumpectomy, right - INVASIVE DUCTAL CARCINOMA, GRADE II/III, SPANNING 2.2 CM. - DUCTAL CARCINOMA IN SITU, HIGH GRADE. - THE SURGICAL RESECTION MARGINS ARE NEGATIVE FOR CARCINOMA. - SEE ONCOLOGY TABLE BELOW. 2. Breast, excision, right additional superior margin - BENIGN FIBROADIPOSE TISSUE. - BENIGN SKELETAL MUSCLE. -  SEE COMMENT. 3. Breast, excision, right additional lateral margin - BENIGN BREAST PARENCHYMA. -  THERE IS NO EVIDENCE OF MALIGNANCY. - SEE COMMENT. 4. Breast, excision, chest wall margin - BENIGN FIBROADIPOSE TISSUE. - THERE IS NO EVIDENCE OF MALIGNANCY. - SEE COMMENT. 5. Lymph node, sentinel, biopsy, right axillary - THERE IS NO EVIDENCE OF CARCINOMA IN 1 OF 1 LYMPH NODE (0/1). 6. Lymph node, sentinel, biopsy, right axillary - THERE IS NO EVIDENCE OF CARCINOMA IN 1 OF 1 LYMPH NODE (0/1). 7. Lymph node, sentinel, biopsy, right axillary - THERE IS NO EVIDENCE OF CARCINOMA IN 1 OF 1 LYMPH NODE (0/1).      06/26/2017 PET scan    PET  IMPRESSION: 1. Postoperative findings both in the right breast and in the anatomic pelvis, with associated low-grade activity considered to be postoperative in nature. No hypermetabolic adenopathy or hypermetabolic lesions are identified to suggest active metastatic disease/malignancy. 2. Other imaging findings of potential clinical significance: Aortic Atherosclerosis (ICD10-I70.0). Sigmoid colon diverticulosis. Biapical pleuroparenchymal scarring in the lungs. Small amount of free pelvic fluid in the cul-de-sac, likely postoperative.       07/01/2017 - 10/14/2017 Chemotherapy    Adjuvant TCH with Onpro every 3 weeks for 6 cycles starting on 07/01/17, Changed Taxol to abraxane with cycle 4 due to drug rash reaction, followed by Herceptin every 3 weeks for 6 months       11/01/2017 - 12/08/2017 Radiation Therapy    Radiation therapy to her right breast with [name]      11/04/2017 -  Chemotherapy    Maintenance Herceptin starting 11/04/17 and will comeplete her 12 months in 06/2018       12/23/2017 -  Anti-estrogen oral therapy    Adjuvant Letrozole 2.5 mg daily       12/30/2017 Imaging    CT CAP W Contrast 12/30/17 IMPRESSION: Increased size of 1.1 cm aorto-caval retroperitoneal lymph node. This could be reactive due to interval hysterectomy, however metastatic carcinoma cannot be excluded. Consider continued attention on short-term  follow-up CT, or PET-CT scan for further evaluation.  Mild hepatic steatosis.      01/25/2018 PET scan    IMPRESSION: 1. Enlarging hypermetabolic aortocaval lymph node is worrisome for metastatic disease. 2. Probable postoperative seroma in the medial right breast, with associated mild inflammatory hypermetabolism.       Endometrial adenocarcinoma ([place])   05/07/2017 Initial Diagnosis    Endometrial adenocarcinoma ([place])      06/09/2017 Surgery    XI ROBOTIC ASSISTED TOTAL LAPOROSCOPIC HYSTERECTOMY WITH BILATERAL SALPINGO OOPHORECTOMY and SENTINEL NODE BIOPSY by Dr. Denman George 06/09/17      06/09/2017 Pathology Results    Diagnosis 06/09/17 1. Lymph node, sentinel, biopsy, right external iliac - METASTATIC ADENOCARCINOMA IN ONE LYMPH NODE (1/1). 2. Lymph node, sentinel, biopsy, left obturator - ONE BENIGN LYMPH NODE (0/1). 3. Lymph node, sentinel, biopsy, left external iliac - METASTATIC ADENOCARCINOMA IN ONE LYMPH NODE (1/1). 4. Uterus +/- tubes/ovaries, neoplastic ENDOMETRIUM: - ENDOMETRIAL ADENOCARCINOMA, 3.2 CM. - CARCINOMA INVADES INNER HALF OF MYOMETRIUM. 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 - CERVIX, BILATERAL FALLOPIAN TUBES AND BILATERAL OVARIES FREE OF TUMOR      08/18/2017 - 09/17/2017 Radiation Therapy    vaginal brachytherapy per [name] on starting 08/18/17       Genetic Testing    Patient has genetic testing done for MSI. 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       Endometrial cancer ([place])   06/09/2017 Initial  Diagnosis    Endometrial cancer ([place])      02/16/2018 -  Chemotherapy    The patient had pembrolizumab (KEYTRUDA) 200 mg in sodium chloride 0.9 % 50 mL chemo infusion, 200 mg, Intravenous, Once, 0 of 4 cycles  for chemotherapy treatment.        HISTORY OF PRESENTING ILLNESS (05/06/2017):  Dawn [name] 67 y.o. female is here because of newly diagnosed breast cancer. Screening mammogram detected right breast mass  on 04/20/2017. Ultrasound showed 1.5 cm mass in the 1:00 position. The axilla was negative on ultrasound. Biopsy of the right breast was performed on 04/27/2017 revealing invasive ductal carcinoma, Grade 2, ER 70% / PR 0% / HER-2+ / Ki-67 30%.   Of note, she was also recently diagnosed with endometrial cancer. Biopsy of curettage endometrium on 04/29/2017 revealed adenocarcinoma. Immunohistology shows the tumor is positive with estrogen receptor, progesterone receptor with patchy positivity with carcinoembryonic antigen and p16. The tumor is negative with CD10 and vimentin. The morphology and immunophenotype are not definitive as to endocervical or endometrial origin. There are rare fragments with endometrial type stroma, which may indicate an endometrial origin although this is not definitive as to origin.  The patient presents today in our multidisciplinary breast clinic. She is doing well overall. She went to her PCP with abdominal pain and vaginal bleeding which felt like she was having her period all the time. Then she presented to the ER with severe vertigo and heavy vaginal bleeding. She has been having heavy vaginal bleeding since mid-July. She does not have a history of anemia and associates it with recent bleeding. She has been taking oral iron. She took an aspirin daily but, has not been taking it lately. She had been taking Evista for one year and stopped taking it on her own. She was taking this medication for her bone density. She takes 2-3 Tylenol daily to manage the abdominal pain / cramping discomfort. She believes this pain is associated with passing clots. She takes a multivitamin daily. The patient is apprehensive about chemotherapy and is unsure if she would like to proceed with chemotherapy if it is recommended. She reports leg cramping. She does not take potassium supplements but, does try to eat bananas. States liver function is typically elevated and has been for about the last 2-3 years.     She has never smoked and doesn't drink alcohol. She did grow up with heavy smoking parents.   Father had colon cancer diagnosed at 37 years-old, and he died of bladder cancer. Paternal grandmother with some type of gynecologic cancer, age of onset in her mid-72s. Patient believes there was more cancer in her family in aunts and uncles however, there are no more living family members to discuss this with.   GYN HISTORY  Menarchal: 71 LMP: age of 84 Contraceptive: none  HRT: none  G2P2: She was 52 when she gave first live births.   CURRENT THERAPY:  -Maintenance Herceptin starting 11/04/17 and will complete 12 months in 06/2018 -Adjuvant letrozole 2.5 mg daily started 12/23/17 -Keytruda every 21 days started on 02/17/2018 for recurrent endometrial cancer  INTERVAL HISTORY:  AMERIKA NOURSE is here for a follow up. She presents to the clinic today accompanied by her husband. She is doing well overall.   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 She is excited to start with chemotherapy today. She has  called Dr. Rhodia Albright at [facility] numerous times and she hasn't heard from the provider as of yet. She planned to retire this year.   On review of systems, she reports right upper breast discomfort x soreness sensation, tightness "catch" to her right lower back area worsened at night x 2 weeks, "like a rock" to her right upper breast area, numbness/tingling to right index finger and her bilateral toes. She notes that she can tolerate the soreness, but she notices the pain. she denies midline back pain, flank pain, BLE swelling, and any other symptoms. Pertinent positives are listed and detailed within the above HPI.     MEDICAL HISTORY:  Past Medical History:  Diagnosis Date  . Anemia    as a child.  . Arthritis   . Bilateral cataracts   . Bilateral leg cramps   . Cancer ([place])    skin  . Dyspnea   . Family history of  bladder cancer   . Family history of colon cancer in father   . Fuchs' corneal dystrophy   . GERD (gastroesophageal reflux disease)   . Hearing loss    Right side 30%  . History of hiatal hernia    small size  . History of radiation therapy 08/21/17, 08/28/17,09/01/17, 09/11/17, 09/17/17   Vaginal cuff brachytherapy.   Marland Kitchen History of radiation therapy 11/11/17- 12/08/17   Right Breast treated to 40.05 Gy with 15 fx of 2.67 Gy and a boost of 10 Gy with 5 fx.   . Hyperlipidemia   . Hypertension   . IBS (irritable bowel syndrome)    hx of  . Malignant neoplasm of upper-inner quadrant of right female breast ([place])   . NAFL (nonalcoholic fatty liver)   . Obesity   . PONV (postoperative nausea and vomiting)   . 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 Uterine cancer ([place])    endometrial cancer  . Varices, gastric   . Vertigo     SURGICAL HISTORY: Past Surgical History:  Procedure Laterality Date  . BREAST BIOPSY Right 04/27/2017  . BREAST LUMPECTOMY WITH RADIOACTIVE SEED AND SENTINEL LYMPH NODE BIOPSY Right 05/25/2017   Procedure: RIGHT BREAST LUMPECTOMY WITH RADIOACTIVE SEED AND  RIGHT AXILLARY SENTINEL LYMPH NODE BIOPSY;  Surgeon: Excell Seltzer, MD;  Location: [facility];  Service: General;  Laterality: Right;  . CATARACT EXTRACTION, BILATERAL    . COLONOSCOPY    . HYSTEROSCOPY W/D&C N/A 04/29/2017   Procedure: DILATATION AND CURETTAGE /HYSTEROSCOPY;  Surgeon: Linda Hedges, DO;  Location: [facility];  Service: Gynecology;  Laterality: N/A;  . PORTACATH PLACEMENT Left 05/25/2017   Procedure: INSERTION PORT-A-CATH;  Surgeon: Excell Seltzer, MD;  Location: [facility];  Service: General;  Laterality: Left;  . ROBOTIC ASSISTED TOTAL HYSTERECTOMY WITH BILATERAL SALPINGO OOPHERECTOMY N/A 06/09/2017   Procedure: XI ROBOTIC ASSISTED TOTAL LAPOROSCOPIC HYSTERECTOMY WITH BILATERAL SALPINGO OOPHORECTOMY;  Surgeon: Everitt Amber, MD;   Location: [facility];  Service: Gynecology;  Laterality: N/A;  . SENTINEL NODE BIOPSY N/A 06/09/2017   Procedure: SENTINEL NODE BIOPSY;  Surgeon: Everitt Amber, MD;  Location: [facility];  Service: Gynecology;  Laterality: N/A;    SOCIAL HISTORY: Social History   Socioeconomic History  . Marital status: Married    Spouse name: Not on file  . Number of children: Not on file  . Years of education: Not on file  . Highest education level: Not on file  Occupational History    Employer: [employer]    Comment: Nurse, mental health research   Social Needs  . Financial resource strain: Not on file  . Food insecurity:    Worry: Not on file    Inability: Not on file  . Transportation needs:    Medical: Not on file    Non-medical: Not on file  Tobacco Use  . Smoking status: Never Smoker  . Smokeless tobacco: Never Used  Substance and Sexual Activity  . Alcohol use: Yes    Alcohol/week: 0.0 oz    Comment: <1/week wine or beer occasional  . Drug use: No  . Sexual activity: Yes    Birth control/protection: Post-menopausal  Lifestyle  . Physical activity:    Days per week: Not on file    Minutes per session: Not on file  . Stress: Not on file  Relationships  . Social connections:    Talks on phone: Not on file    Gets together: Not on file    Attends religious service: Not on file    Active member of club or organization: Not on file    Attends meetings of clubs or organizations: Not on file    Relationship status: Not on file  . Intimate partner violence:    Fear of current or ex partner: Not on file    Emotionally abused: Not on file    Physically abused: Not on file    Forced sexual activity: Not on file  Other Topics Concern  . Not on file  Social History Narrative   2-3 caffeine drinks per day. Regular exercise.  Walking 3 miles a day.     FAMILY HISTORY: Family History  Problem Relation Age of Onset  . Colon cancer Father 89  . Heart attack Father 45  . Prostate  cancer Father        dx 68's  . Bladder Cancer Father 55  . Stroke Mother 20  . Diabetes Maternal Grandfather   . Kidney disease Maternal Grandfather   . Asthma Brother   . Cancer Paternal Grandmother 16       GYN cancer ( thinks ovarian, maybe uterine)  . Heart attack Maternal Grandmother 70  . Breast cancer Other 73  . Colon cancer Other 59  . Cancer Other        type unk, age dx unk    ALLERGIES:  is allergic to ciprofloxacin; crestor [rosuvastatin calcium]; fosamax [alendronate sodium]; penicillins; and taxol [paclitaxel].  MEDICATIONS:  Current Outpatient Medications  Medication Sig Dispense Refill  . acetaminophen (TYLENOL) 325 MG tablet Take 650 mg by mouth every 6 (six) hours as needed for mild pain or moderate pain.    . Ascorbic Acid (VITAMIN C) 1000 MG tablet Take 1,000 mg by mouth daily.    Marland Kitchen b complex vitamins capsule Take 1 capsule by mouth daily.    Marland Kitchen ibandronate (BONIVA) 150 MG tablet Take in the morning with a full glass of water,on an empty stomach,do not take anything else by mouth or lie down for the next 30 min. 3 tablet 3  . letrozole (FEMARA) 2.5 MG tablet Take 1 tablet (2.5 mg total) by mouth daily. 30 tablet 2  . meclizine (ANTIVERT) 25 MG tablet Take 1-2 tablets (25-50 mg total) by mouth 3 (three) times daily as needed for dizziness. (Patient taking differently: Take 25-50 mg by mouth 3 (three) times daily as needed for dizziness (depends on dizziness if takes 1-2 tablets). ) 30 tablet 0  . Multiple Vitamin (MULTIVITAMIN)  capsule Take 1 capsule by mouth daily.      Marland Kitchen olmesartan-hydrochlorothiazide (BENICAR HCT) 20-12.5 MG tablet Take 1 tablet by mouth daily. 90 tablet 2  . Pitavastatin Calcium (LIVALO) 2 MG TABS Take 1.5 tablets (3 mg total) by mouth every evening. Takes 1.5 tablet 135 tablet 3  . sodium chloride (MURO 128) 5 % ophthalmic ointment Place 1 application into both eyes 2 (two) times daily.      No current facility-administered medications for  this visit.     REVIEW OF SYSTEMS:   Constitutional: Denies fevers, chills or abnormal night sweats  Eyes: Denies blurriness of vision, double vision or watery eyes Ears, nose, mouth, throat, and face: Denies mucositis or sore throat Respiratory: Denies cough, dyspnea or wheezes Cardiovascular: Denies palpitation, chest discomfort or lower extremity swelling Gastrointestinal:  Denies nausea, vomiting, constipation, diarrhea, heartburn or change in bowel habits (+) Occasional abdominal pain, tolerable.  Skin: Denies abnormal skin rashes.  Lymphatics: Denies new lymphadenopathy or easy bruising Musculoskeletal: Negative Neurological:Denies numbness, new weaknesses (+) vertigo (+) neuropathy in fingers and feet Behavioral/Psych: Mood is stable, no new changes  All other systems were reviewed with the patient and are negative.  PHYSICAL EXAMINATION:  ECOG PERFORMANCE STATUS: 1 - Symptomatic but completely ambulatory  Vitals:   02/17/18 0857  BP: 136/75  Pulse: 72  Resp: 18  Temp: 97.8 F (36.6 C)  SpO2: 99%   Filed Weights   02/17/18 0857  Weight: 188 lb 12.8 oz (85.6 kg)     GENERAL:alert, no distress and comfortable SKIN: skin color, texture, turgor are normal, no rashes or significant lesions EYES: normal, conjunctiva are pink and non-injected, sclera clear OROPHARYNX:no exudate, no erythema and lips, buccal mucosa, and tongue normal  NECK: supple, thyroid normal size, non-tender, without nodularity LYMPH:  no palpable lymphadenopathy in the cervical, axillary or inguinal LUNGS: clear to auscultation with normal breathing effort HEART: regular rate & rhythm and no lower extremity edema (+) heart murmur in the aortic valve ABDOMEN:abdomen soft, non-tender and normal bowel sounds (+) s/p total hysterectomy and salpingo-oophorectomy, 4 horizontal laparoscopic surgical incisions at the level of the umbilicus, healing well  Musculoskeletal:no cyanosis of digits and no clubbing    PSYCH: alert & oriented x 3 with fluent speech NEURO: no focal motor/sensory deficits BREAST: Left breast shows no palpable mass or adenopathy. Soft tissues fullness in UIQ of right breast and tenderness. S/p right lumpectomy and radiation. Her surgical incision in right axilla and around the areola healed well without skin erythema or discharge. No other palpable mass noted or edema of the area.   LABORATORY DATA:  I have reviewed the data as listed CBC Latest Ref Rng & Units 02/17/2018 01/27/2018 01/06/2018  WBC 3.9 - 10.3 K/uL 3.7(L) 3.6(L) 3.3(L)  Hemoglobin 11.6 - 15.9 g/dL 12.7 13.1 13.2  Hematocrit 34.8 - 46.6 % 37.0 37.8 37.7  Platelets 145 - 400 K/uL 148 149 175    CMP Latest Ref Rng & Units 02/17/2018 01/27/2018 01/06/2018  Glucose 70 - 140 mg/dL 120 126 103  BUN 7 - 26 mg/dL '12 12 11  '$ Creatinine 0.60 - 1.10 mg/dL 0.80 0.83 0.78  Sodium 136 - 145 mmol/L 136 137 135(L)  Potassium 3.5 - 5.1 mmol/L 3.4(L) 3.4(L) 3.3(L)  Chloride 98 - 109 mmol/L 100 102 99  CO2 22 - 29 mmol/L '26 28 25  '$ Calcium 8.4 - 10.4 mg/dL 9.1 9.3 9.6  Total Protein 6.4 - 8.3 g/dL 6.9 7.1 7.2  Total Bilirubin 0.2 -  1.2 mg/dL 0.6 0.5 0.4  Alkaline Phos 40 - 150 U/L 46 51 47  AST 5 - 34 U/L 101(H) 85(H) 76(H)  ALT 0 - 55 U/L 153(H) 143(H) 143(H)   PATHOLOGY FINDINGS:  Diagnosis 06/09/17 1. Lymph node, sentinel, biopsy, right external iliac - METASTATIC ADENOCARCINOMA IN ONE LYMPH NODE (1/1). 2. Lymph node, sentinel, biopsy, left obturator - ONE BENIGN LYMPH NODE (0/1). 3. Lymph node, sentinel, biopsy, left external iliac - METASTATIC ADENOCARCINOMA IN ONE LYMPH NODE (1/1). 4. Uterus +/- tubes/ovaries, neoplastic ENDOMETRIUM: - ENDOMETRIAL ADENOCARCINOMA, 3.2 CM. - CARCINOMA INVADES INNER HALF OF MYOMETRIUM. 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 - CERVIX, BILATERAL FALLOPIAN TUBES AND BILATERAL OVARIES FREE OF TUMOR. Microscopic Comment 4. ONCOLOGY TABLE-UTERUS, CARCINOMA OR CARCINOSARCOMA Specimen:  Uterus with bilateral fallopian tubes and ovaries, right and left external iliac nodes and left obturator lymph node. Procedure: Hysterectomy with bilateral salpingo-oophorectomy and lymph node biopsies. Lymph node sampling performed: Yes. Specimen integrity: Intact. Maximum tumor size: 3.2 cm. Histologic type: Mixed, endometrioid, clear cell and serous. Grade: III. Myometrial invasion: 0.8 cm where myometrium is 2.2 cm in thickness. Cervical stromal involvement: No. Extent of involvement of other organs: N/A. Lymph - vascular invasion: Present. Peritoneal washings: N/A. Lymph nodes: Examined: 3 Sentinel 1 of 3 FINAL for AUBREYANA, SALTZ ([id number]) Microscopic Comment(continued) 0 Non-sentinel 3 Total Lymph nodes with metastasis: 2. Isolated tumor cells (< 0.2 mm): 0. Micrometastasis: (> 0.2 mm and < 2.0 mm): 0. Macrometastasis: (> 2.0 mm): 2. Extracapsular extension: No. TNM code: pT1a, pN1. FIGO Stage (based on pathologic findings, needs clinical correlation): IIIC1. Comments: The tumor is 3.2 cm in greatest dimension and invades the myometrium to a depth of 0.8 cm where the myometrium is 2.2 cm in thickness and there is lymphatic vascular involvement by tumor. 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 (JDP:ah 06/10/17)   Diagnosis 05/25/17 1. Breast, lumpectomy, right - INVASIVE DUCTAL CARCINOMA, GRADE II/III, SPANNING 2.2 CM. - DUCTAL CARCINOMA IN SITU, HIGH GRADE. - THE SURGICAL RESECTION MARGINS ARE NEGATIVE FOR CARCINOMA. - SEE ONCOLOGY TABLE BELOW. 2. Breast, excision, right additional superior margin - BENIGN FIBROADIPOSE TISSUE. - BENIGN SKELETAL MUSCLE. - SEE COMMENT. 3. Breast, excision, right additional lateral margin - BENIGN BREAST PARENCHYMA. - THERE IS NO EVIDENCE OF MALIGNANCY. - SEE COMMENT. 4. Breast, excision, chest wall margin - BENIGN FIBROADIPOSE TISSUE. - THERE IS NO EVIDENCE OF MALIGNANCY. - SEE COMMENT. 5. Lymph  node, sentinel, biopsy, right axillary - THERE IS NO EVIDENCE OF CARCINOMA IN 1 OF 1 LYMPH NODE (0/1). 6. Lymph node, sentinel, biopsy, right axillary - THERE IS NO EVIDENCE OF CARCINOMA IN 1 OF 1 LYMPH NODE (0/1). 7. Lymph node, sentinel, biopsy, right axillary - THERE IS NO EVIDENCE OF CARCINOMA IN 1 OF 1 LYMPH NODE (0/1). Microscopic Comment 1. BREAST, INVASIVE TUMOR Procedure: Seed localized lumpectomy, additional superior, lateral, and chest wall margin resections and axillary lymph node resections. Laterality: Right. Tumor Size: 2.2 cm (gross measurement). Histologic Type: Ductal. Microscopic Comment(continued) Grade: II. Tubular Differentiation: 2. Nuclear Pleomorphism: 2. Mitotic Count: 2. Ductal Carcinoma in Situ (DCIS): Present, high grade. Extent of Tumor: Confined to breast parenchyma. Margins: Greater than or equal to 0.2 cm to all margins. Regional Lymph Nodes: Number of Lymph Nodes Examined: 3. Number of Sentinel Lymph Nodes Examined: 3. Lymph Nodes with Macrometastases: 0. Lymph Nodes with Micrometastases: 0. Lymph Nodes with Isolated Tumor Cells: 0. Breast Prognostic Profile: Case [id number]. Estrogen Receptor: 70%, moderate. Progesterone Receptor: 0%.  Her2: Amplification was detected. The ratio was 2.91. Ki-67: 30%. Best tumor block for sendout testing: 1A-1C. Pathologic Stage Classification (pTNM, AJCC 8th Edition): Primary Tumor (pT): pT2. Regional Lymph Nodes (pN): pN0. Distant Metastases (pM): pMX. 2. - 4. The surgical resection margin(s) of the specimen were inked and microscopically evaluated.  Diagnosis 04/29/2017 Endometrium, curettage - ADENOCARCINOMA. - SEE MICROSCOPIC DESCRIPTION. Microscopic Comment There are fragments of adenocarcinoma, some of which have cribriform architecture, and there is focal extracellular mucin. Immunohistochemistry shows the tumor is positive with estrogen receptor, progesterone receptor with patchy positivity  with carcinoembryonic antigen and p16. The tumor is negative with CD10 and vimentin. The morphology and immunophenotype are not definitive as to endocervical or endometrial origin. There are rare fragments with endometrial type stroma, which may indicate an endometrial origin although this is not definitive as to origin.  Diagnosis 04/27/2017 Breast, right, needle core biopsy INVASIVE DUCTAL CARCINOMA, GRADE 2 Microscopic Comment The neoplasm has intracellular mucin and signet ring cell features. Immunostains shows these cells are positive for ER, GATA3, ck7 and GCDFP, negative for ck20, cdx2, TTF-1 and pax8, The immunostaining pattern supports the neoplasm is breast primary. The Breast prognostic profile has been ordered. Results: IMMUNOHISTOCHEMICAL AND MORPHOMETRIC ANALYSIS PERFORMED MANUALLY Estrogen Receptor: 70%, POSITIVE, MODERATE STAINING INTENSITY Progesterone Receptor: 0%, NEGATIVE Proliferation Marker Ki67: 30% Results: HER2 - **POSITIVE** RATIO OF HER2/CEP17 SIGNALS 2.91 AVERAGE HER2 COPY NUMBER PER CELL 7.28  GENETICS 05/07/17   PROCEDURES  ECHO 11/30/17 Impressions: - Normal LV size and systolic function, EF 76-22%. Strain as above.   Normal RV size and systolic function. No significant valvular   abnormalities.  ECHO 08/27/17  Impressions: - Normal LV size and systolic function, EF 63-33%. Strain as above.   Mildly dilated RV with normal systolic function.  ECHO 05/07/17 Impressions: - Normal LV size with mild LV hypertrophy. EF 65-70%, vigorous   systolic function. Strain as above. Normal RV size and systolic   function. No significant valvular abnormalities.  RADIOGRAPHIC STUDIES: I have personally reviewed the radiological images as listed and agreed with the findings in the report. Nm Pet Image Restag (ps) Skull Base To Thigh  Result Date: 01/25/2018 CLINICAL DATA:  Subsequent treatment strategy for endometrial cancer. EXAM: NUCLEAR MEDICINE PET SKULL  BASE TO THIGH TECHNIQUE: 9.3 mCi F-18 FDG was injected intravenously. Full-ring PET imaging was performed from the skull base to thigh after the radiotracer. CT data was obtained and used for attenuation correction and anatomic localization. Fasting blood glucose: 111 mg/dl COMPARISON:  CT chest abdomen pelvis 12/30/2017 and PET 06/26/2017. FINDINGS: Mediastinal blood pool activity: SUV max 3.0 NECK: No hypermetabolic lymph nodes in the neck. Incidental CT findings: None. CHEST: Postoperative fluid collection in the medial right breast measures 2.5 cm and has mild associated hypermetabolism, SUV max 4.0. No hypermetabolic mediastinal, hilar or axillary lymph nodes. No hypermetabolic pulmonary nodules. Incidental CT findings: Left IJ Port-A-Cath terminates in the low SVC. Heart is mildly enlarged. No pericardial or pleural effusion. ABDOMEN/PELVIS: A hazy aortocaval lymph node (CT image 130) measures 10 mm, enlarged from 6 mm on 06/26/2017, with an SUV max of 3.7. No additional hypermetabolic lymph nodes. No abnormal hypermetabolism in the liver, adrenal glands, spleen or pancreas. Incidental CT findings: None. SKELETON: No abnormal osseous hypermetabolism. Incidental CT findings: none IMPRESSION: 1. Enlarging hypermetabolic aortocaval lymph node is worrisome for metastatic disease. 2. Probable postoperative seroma in the medial right breast, with associated mild inflammatory hypermetabolism. Electronically Signed   By: Lorin Picket M.D.  On: 01/25/2018 14:48    ASSESSMENT & PLAN:  67 y.o. 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 Malignant neoplasm of upper inner quadrant of right breast , Invasive ductal carcinoma, pT2N0M0, G2,  ER+/PR-/HER2+ -She underwent right breast lumpectomy with sentinel lymph node biopsy with Dr. Excell Seltzer on 05/25/2017 with port placement -Pathology confirmed invasive ductal carcinoma, grade 2,  spanning 2.2 cm, margins were clear, no positive lymph nodes, ER positive, PR negative, HER-2 positive. I reviewed this with the patient previously -We previously discussed the risk of cancer recurrence after completed surgical resection.  Due to the HER-2 positive disease, she is at moderate to high risk  I recommended adjuvant chemotherapy at that time. The regiment was selected to cover for endometrial cancer also. -She completed adjuvant TCH every 3 weeks with Onpro from 07/01/17-10/14/17. I changed Taxol to abraxane with cycle 4 due to drug rash reaction. She tolerated relatively well besides the rash and Grade 1 neuropathy. I encouraged her to take a B complex vitamin.  -She is currently on Herceptin maintenance therapy began on 11/04/17. Plan for a total of 12 months. Will complete in Oct 2019.  -She started adjuvant radiation therapy on her breast on 11/01/17 with [name] and completed on 12/08/17. -She started Letrozole on 12/23/17 and is tolerating well with no present side effects at this time. Will continue for 5-7 years -continue breast cancer surveillance, due for mammogram in 04/2018   2. Endometrial adenocarcinoma with lymph node metastasis, pT1apN1M0, FIGO stage IIIc, MSI-H, RP node recurrence in 12/2017  -She underwent a total hysterectomy with salpingo-oophorectomy  by Dr. Denman George on 06/09/17 -We previously discussed her surgical pathology. she has high risk features including lymphovascular involvement, nodal positive disease, and mixed histology with endometrioid, clear cell, and serous carcinoma  -We previously discussed the high risk of cancer recurrence after surgery extensively, and I strongly encouraged her to consider adjuvant chemotherapy to reduce her risk of recurrence. -She completed Taxol/carbo (taxol changed to abraxane) every 3 weeks x6 cycles on 10/14/17 and adjuvant vaginal brachytherapy from 08/18/17-09/17/17.  She tolerated well overall.  She experiences mild vaginal bleeding  when using dilator as instructed per rad onc. -Her 12/30/17 CT scan showed 1.1 cm aorta-caval retroperitoneal lymph node which is borderline reactive. This was further evaluated by PET in 01/2018 which showed mild FDG activity, no other metastasis. -Her case was extensively discussed in GYN tumor board, especially with Drs. Cleda Clarks and Dr. Rhodia Albright at [facility]. The node is very difficult to biopsy, and the consensus is starting systemic therapy due to the high possibility of node recurrence.  -I discussed her endometrial cancer was tested for MSI and resulted MSI-high, which predicts good response to immunotherapy. She had genetic testing which was negative for Lynch syndrome.  -I recommend her to start immunotherapy Keytruda, instead of chemo, due to her MSI-H disease. Potential side effects, especially immune related disorders, endocrine disorders, pneumonitis, colitis, fatigue, skin rashes, liver toxicities ect. She voiced good understanding and agrees to proceed. -we discussed that the goal of therapy is palliative, for disease control, although there is small possibility immunotherapy may cure her recurrence.  -Advised that the patient proceed with an additional CT AP scan q 2-3 months while on immunotherapy. If there is no response, then the patient will continue for up to 2 years on treatment. If her insurance approves, I will order a CT AP scan on next week  to obtain a new baseline.   -I recommend a new baseline CT abd/pel with contrast next week if her insurance approves   3. Genetics -Given her personal history of breast and endometrial cancer, and a family history of malignancy, she will be referred to see a genetic counselor for genetic testing to ruled out cancer syndromes. -Genetics results were negative   4. HTN -We monitored her Hypertension while on chemo, she previously held HCTZ on days of chemo -F/u with PCP  5.  Transaminitis  - LFTs are chronically elevated due to fatty liver  disease -We monitored her liver functions closely while on chemo, stable overall  -Will continue monitoring    6. Bone Health  -I previously discussed that aromatase inhibitors can potentially weaken the bone. I encouraged her to be active and to take a Vit D and Ca supplement.  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 I previously discussed this can take 1-2 years to recover.  -She will continue B complex. I previously recommended Neurontin for her tingling and pain. I discussed side effects. She would like to hold off for now.  -I previously encouraged her to remain active with exercise.   Follow-up: -Keytruda and herceptin today and continue every 3 weeks  -CT AP next week  -f/u in 3 weeks      Orders Placed This Encounter  Procedures  . CT Abdomen Pelvis W Contrast    Standing Status:   Future    Standing Expiration Date:   02/17/2019    Order Specific Question:   If indicated for the ordered procedure, I authorize the administration of contrast media per Radiology protocol    Answer:   Yes    Order Specific Question:   Preferred imaging location?    Answer:   [facility]    Order Specific Question:   Is Oral Contrast requested for this exam?    Answer:   Yes, Per Radiology protocol    Order Specific Question:   Call Results- Best Contact Number?    Answer:   f/u abdominal adenopathy    Order Specific Question:   Radiology Contrast Protocol - do NOT remove file path    Answer:   [email or web address]    Pt and her husband had many questions. All questions were answered. The patient knows to call the clinic with any problems, questions or concerns.  I spent 25 minutes counseling the patient face to face. The total time spent in the appointment was 40 minutes and more than 50% was on counseling.   I, Soijett Blue am acting as scribe for Dr. Truitt Merle.  I  have reviewed the above documentation for accuracy and completeness, and I agree with the above.   Truitt Merle  02/17/2018

## 2018-02-16 NOTE — Telephone Encounter (Signed)
Received fax from [employer] that [employer] requires a PA. Information has been sent to the insurance company. Awaiting determination.   

## 2018-02-17 ENCOUNTER — Inpatient Hospital Stay: Payer: Medicare Other

## 2018-02-17 ENCOUNTER — Other Ambulatory Visit: Payer: Medicare Other

## 2018-02-17 ENCOUNTER — Ambulatory Visit: Payer: Medicare Other | Admitting: Hematology

## 2018-02-17 ENCOUNTER — Inpatient Hospital Stay: Payer: Medicare Other | Attending: Hematology

## 2018-02-17 ENCOUNTER — Inpatient Hospital Stay (HOSPITAL_BASED_OUTPATIENT_CLINIC_OR_DEPARTMENT_OTHER): Payer: Medicare Other | Admitting: Hematology

## 2018-02-17 ENCOUNTER — Other Ambulatory Visit: Payer: Self-pay | Admitting: Family Medicine

## 2018-02-17 VITALS — BP 136/75 | HR 72 | Temp 97.8°F | Resp 18 | Ht 63.0 in | Wt 188.8 lb

## 2018-02-17 DIAGNOSIS — G620 Drug-induced polyneuropathy: Secondary | ICD-10-CM

## 2018-02-17 DIAGNOSIS — Z923 Personal history of irradiation: Secondary | ICD-10-CM

## 2018-02-17 DIAGNOSIS — C50211 Malignant neoplasm of upper-inner quadrant of right female breast: Secondary | ICD-10-CM | POA: Diagnosis not present

## 2018-02-17 DIAGNOSIS — Z170 Estrogen receptor positive status [ER+]: Secondary | ICD-10-CM

## 2018-02-17 DIAGNOSIS — M810 Age-related osteoporosis without current pathological fracture: Secondary | ICD-10-CM

## 2018-02-17 DIAGNOSIS — Z79899 Other long term (current) drug therapy: Secondary | ICD-10-CM | POA: Insufficient documentation

## 2018-02-17 DIAGNOSIS — C541 Malignant neoplasm of endometrium: Secondary | ICD-10-CM

## 2018-02-17 DIAGNOSIS — Z79811 Long term (current) use of aromatase inhibitors: Secondary | ICD-10-CM

## 2018-02-17 DIAGNOSIS — Z95828 Presence of other vascular implants and grafts: Secondary | ICD-10-CM

## 2018-02-17 DIAGNOSIS — Z5112 Encounter for antineoplastic immunotherapy: Secondary | ICD-10-CM | POA: Diagnosis not present

## 2018-02-17 LAB — CBC WITH DIFFERENTIAL/PLATELET
Basophils Absolute: 0 10*3/uL (ref 0.0–0.1)
Basophils Relative: 0 %
EOS PCT: 2 %
Eosinophils Absolute: 0.1 10*3/uL (ref 0.0–0.5)
HEMATOCRIT: 37 % (ref 34.8–46.6)
Hemoglobin: 12.7 g/dL (ref 11.6–15.9)
LYMPHS ABS: 1.4 10*3/uL (ref 0.9–3.3)
LYMPHS PCT: 39 %
MCH: 34.7 pg — AB (ref 25.1–34.0)
MCHC: 34.3 g/dL (ref 31.5–36.0)
MCV: 101.1 fL — AB (ref 79.5–101.0)
Monocytes Absolute: 0.4 10*3/uL (ref 0.1–0.9)
Monocytes Relative: 12 %
NEUTROS ABS: 1.8 10*3/uL (ref 1.5–6.5)
Neutrophils Relative %: 47 %
PLATELETS: 148 10*3/uL (ref 145–400)
RBC: 3.66 MIL/uL — ABNORMAL LOW (ref 3.70–5.45)
RDW: 12.5 % (ref 11.2–14.5)
WBC: 3.7 10*3/uL — ABNORMAL LOW (ref 3.9–10.3)

## 2018-02-17 LAB — COMPREHENSIVE METABOLIC PANEL
ALK PHOS: 46 U/L (ref 40–150)
ALT: 153 U/L — AB (ref 0–55)
AST: 101 U/L — ABNORMAL HIGH (ref 5–34)
Albumin: 3.8 g/dL (ref 3.5–5.0)
Anion gap: 10 (ref 3–11)
BILIRUBIN TOTAL: 0.6 mg/dL (ref 0.2–1.2)
BUN: 12 mg/dL (ref 7–26)
CALCIUM: 9.1 mg/dL (ref 8.4–10.4)
CHLORIDE: 100 mmol/L (ref 98–109)
CO2: 26 mmol/L (ref 22–29)
CREATININE: 0.8 mg/dL (ref 0.60–1.10)
Glucose, Bld: 120 mg/dL (ref 70–140)
Potassium: 3.4 mmol/L — ABNORMAL LOW (ref 3.5–5.1)
Sodium: 136 mmol/L (ref 136–145)
TOTAL PROTEIN: 6.9 g/dL (ref 6.4–8.3)

## 2018-02-17 MED ORDER — ACETAMINOPHEN 325 MG PO TABS
650.0000 mg | ORAL_TABLET | Freq: Once | ORAL | Status: AC
  Administered 2018-02-17: 650 mg via ORAL

## 2018-02-17 MED ORDER — SODIUM CHLORIDE 0.9 % IV SOLN
200.0000 mg | Freq: Once | INTRAVENOUS | Status: AC
  Administered 2018-02-17: 200 mg via INTRAVENOUS
  Filled 2018-02-17: qty 8

## 2018-02-17 MED ORDER — TRASTUZUMAB CHEMO 150 MG IV SOLR
6.0000 mg/kg | Freq: Once | INTRAVENOUS | Status: AC
  Administered 2018-02-17: 525 mg via INTRAVENOUS
  Filled 2018-02-17: qty 25

## 2018-02-17 MED ORDER — SODIUM CHLORIDE 0.9% FLUSH
10.0000 mL | INTRAVENOUS | Status: DC | PRN
  Administered 2018-02-17: 10 mL
  Filled 2018-02-17: qty 10

## 2018-02-17 MED ORDER — SODIUM CHLORIDE 0.9% FLUSH
10.0000 mL | INTRAVENOUS | Status: DC | PRN
  Administered 2018-02-17: 10 mL via INTRAVENOUS
  Filled 2018-02-17: qty 10

## 2018-02-17 MED ORDER — DIPHENHYDRAMINE HCL 25 MG PO CAPS
50.0000 mg | ORAL_CAPSULE | Freq: Once | ORAL | Status: AC
  Administered 2018-02-17: 50 mg via ORAL

## 2018-02-17 MED ORDER — SODIUM CHLORIDE 0.9 % IV SOLN
Freq: Once | INTRAVENOUS | Status: AC
  Administered 2018-02-17: 10:00:00 via INTRAVENOUS

## 2018-02-17 MED ORDER — DIPHENHYDRAMINE HCL 25 MG PO CAPS
ORAL_CAPSULE | ORAL | Status: AC
  Filled 2018-02-17: qty 2

## 2018-02-17 MED ORDER — ACETAMINOPHEN 325 MG PO TABS
ORAL_TABLET | ORAL | Status: AC
  Filled 2018-02-17: qty 2

## 2018-02-17 MED ORDER — HEPARIN SOD (PORK) LOCK FLUSH 100 UNIT/ML IV SOLN
500.0000 [IU] | Freq: Once | INTRAVENOUS | Status: AC | PRN
  Administered 2018-02-17: 500 [IU]
  Filled 2018-02-17: qty 5

## 2018-02-17 MED ORDER — IBANDRONATE SODIUM 150 MG PO TABS: ORAL_TABLET | ORAL | 3 refills | Status: DC

## 2018-02-17 NOTE — Patient Instructions (Signed)
[facility] Discharge Instructions for Patients Receiving Chemotherapy  Today you received the following chemotherapy agents Herceptin and Keytruda  To help prevent nausea and vomiting after your treatment, we encourage you to take your nausea medication as directed   If you develop nausea and vomiting that is not controlled by your nausea medication, call the clinic.   BELOW ARE SYMPTOMS THAT SHOULD BE REPORTED IMMEDIATELY:  *FEVER GREATER THAN 100.5 F  *CHILLS WITH OR WITHOUT FEVER  NAUSEA AND VOMITING THAT IS NOT CONTROLLED WITH YOUR NAUSEA MEDICATION  *UNUSUAL SHORTNESS OF BREATH  *UNUSUAL BRUISING OR BLEEDING  TENDERNESS IN MOUTH AND THROAT WITH OR WITHOUT PRESENCE OF ULCERS  *URINARY PROBLEMS  *BOWEL PROBLEMS  UNUSUAL RASH Items with * indicate a potential emergency and should be followed up as soon as possible.  Feel free to call the clinic should you have any questions or concerns. The clinic phone number is [phone].  Please show the CHEMO ALERT CARD at check-in to the Emergency Department and triage nurse.  Pembrolizumab injection (Keytruda) What is this medicine? PEMBROLIZUMAB (pem broe liz ue mab) is a monoclonal antibody. It is used to treat melanoma, head and neck cancer, Hodgkin lymphoma, non-small cell lung cancer, urothelial cancer, stomach cancer, and cancers that have a certain genetic condition. This medicine may be used for other purposes; ask your health care provider or pharmacist if you have questions. COMMON BRAND NAME(S): Keytruda What should I tell my health care provider before I take this medicine? They need to know if you have any of these conditions: -diabetes -immune system problems -inflammatory bowel disease -liver disease -lung or breathing disease -lupus -organ transplant -an unusual or allergic reaction to pembrolizumab, other medicines, foods, dyes, or preservatives -pregnant or trying to get  pregnant -breast-feeding How should I use this medicine? This medicine is for infusion into a vein. It is given by a health care professional in a hospital or clinic setting. A special MedGuide will be given to you before each treatment. Be sure to read this information carefully each time. Talk to your pediatrician regarding the use of this medicine in children. While this drug may be prescribed for selected conditions, precautions do apply. Overdosage: If you think you have taken too much of this medicine contact a poison control center or emergency room at once. NOTE: This medicine is only for you. Do not share this medicine with others. What if I miss a dose? It is important not to miss your dose. Call your doctor or health care professional if you are unable to keep an appointment. What may interact with this medicine? Interactions have not been studied. Give your health care provider a list of all the medicines, herbs, non-prescription drugs, or dietary supplements you use. Also tell them if you smoke, drink alcohol, or use illegal drugs. Some items may interact with your medicine. This list may not describe all possible interactions. Give your health care provider a list of all the medicines, herbs, non-prescription drugs, or dietary supplements you use. Also tell them if you smoke, drink alcohol, or use illegal drugs. Some items may interact with your medicine. What should I watch for while using this medicine? Your condition will be monitored carefully while you are receiving this medicine. You may need blood work done while you are taking this medicine. Do not become pregnant while taking this medicine or for 4 months after stopping it. Women should inform their doctor if they wish to become pregnant or   think they might be pregnant. There is a potential for serious side effects to an unborn child. Talk to your health care professional or pharmacist for more information. Do not breast-feed  an infant while taking this medicine or for 4 months after the last dose. What side effects may I notice from receiving this medicine? Side effects that you should report to your doctor or health care professional as soon as possible: -allergic reactions like skin rash, itching or hives, swelling of the face, lips, or tongue -bloody or black, tarry -breathing problems -changes in vision -chest pain -chills -constipation -cough -dizziness or feeling faint or lightheaded -fast or irregular heartbeat -fever -flushing -hair loss -low blood counts - this medicine may decrease the number of white blood cells, red blood cells and platelets. You may be at increased risk for infections and bleeding. -muscle pain -muscle weakness -persistent headache -signs and symptoms of high blood sugar such as dizziness; dry mouth; dry skin; fruity breath; nausea; stomach pain; increased hunger or thirst; increased urination -signs and symptoms of kidney injury like trouble passing urine or change in the amount of urine -signs and symptoms of liver injury like dark urine, light-colored stools, loss of appetite, nausea, right upper belly pain, yellowing of the eyes or skin -stomach pain -sweating -weight loss Side effects that usually do not require medical attention (report to your doctor or health care professional if they continue or are bothersome): -decreased appetite -diarrhea -tiredness This list may not describe all possible side effects. Call your doctor for medical advice about side effects. You may report side effects to FDA at 1-800-FDA-1088. Where should I keep my medicine? This drug is given in a hospital or clinic and will not be stored at home. NOTE: This sheet is a summary. It may not cover all possible information. If you have questions about this medicine, talk to your doctor, pharmacist, or health care provider.  2018 Elsevier/Gold Standard (2016-06-10 12:29:36)

## 2018-02-17 NOTE — Progress Notes (Signed)
Per Dr Burr Medico ok to tx with AST 101 and ALT 153, will monitor labs closely.

## 2018-02-18 ENCOUNTER — Encounter: Payer: Self-pay | Admitting: Hematology

## 2018-02-18 NOTE — Telephone Encounter (Signed)
[employer] approved from 02/18/2018 through 09/14/2018. Form sent to scan.

## 2018-02-19 ENCOUNTER — Other Ambulatory Visit: Payer: Self-pay

## 2018-02-19 MED ORDER — OLMESARTAN MEDOXOMIL-HCTZ 20-12.5 MG PO TABS: 1.0000 | ORAL_TABLET | Freq: Every day | ORAL | 2 refills | Status: DC

## 2018-02-19 MED ORDER — IBANDRONATE SODIUM 150 MG PO TABS: ORAL_TABLET | ORAL | 3 refills | Status: DC

## 2018-02-19 MED ORDER — PITAVASTATIN CALCIUM 2 MG PO TABS: 3.0000 mg | ORAL_TABLET | Freq: Every evening | ORAL | 3 refills | Status: DC

## 2018-02-19 NOTE — Progress Notes (Signed)
Rx sent to pharmacy for patient.

## 2018-02-24 ENCOUNTER — Ambulatory Visit (HOSPITAL_COMMUNITY)
Admission: RE | Admit: 2018-02-24 | Discharge: 2018-02-24 | Disposition: A | Discharge status: Home / Self Care | Payer: Medicare Other | Source: Ambulatory Visit | Attending: Hematology | Admitting: Hematology

## 2018-02-24 ENCOUNTER — Encounter (HOSPITAL_COMMUNITY): Payer: Self-pay

## 2018-02-24 DIAGNOSIS — C541 Malignant neoplasm of endometrium: Secondary | ICD-10-CM | POA: Diagnosis not present

## 2018-02-24 DIAGNOSIS — K449 Diaphragmatic hernia without obstruction or gangrene: Secondary | ICD-10-CM | POA: Diagnosis not present

## 2018-02-24 MED ORDER — HEPARIN SOD (PORK) LOCK FLUSH 100 UNIT/ML IV SOLN
500.0000 [IU] | Freq: Once | INTRAVENOUS | Status: AC
  Administered 2018-02-24: 500 [IU]

## 2018-02-24 MED ORDER — IOPAMIDOL (ISOVUE-300) INJECTION 61%
INTRAVENOUS | Status: AC
  Filled 2018-02-24: qty 100

## 2018-02-24 MED ORDER — HEPARIN SOD (PORK) LOCK FLUSH 100 UNIT/ML IV SOLN
INTRAVENOUS | Status: AC
  Filled 2018-02-24: qty 5

## 2018-02-24 MED ORDER — IOPAMIDOL (ISOVUE-300) INJECTION 61%
100.0000 mL | Freq: Once | INTRAVENOUS | Status: AC | PRN
  Administered 2018-02-24: 100 mL via INTRAVENOUS

## 2018-02-25 ENCOUNTER — Telehealth: Payer: Self-pay

## 2018-02-25 NOTE — Telephone Encounter (Signed)
Called patient per [name] with CT results, no progression, no new nodules or masses.  Pt verbalized an understanding.

## 2018-02-25 NOTE — Telephone Encounter (Signed)
-----   Message from Truitt Merle, MD sent at 02/25/2018  8:59 AM EDT ----- Please call pt and let her know the CT results, thanks.  Truitt Merle  02/25/2018

## 2018-03-04 ENCOUNTER — Ambulatory Visit (HOSPITAL_COMMUNITY)
Admission: RE | Admit: 2018-03-04 | Discharge: 2018-03-04 | Disposition: A | Discharge status: Home / Self Care | Payer: Medicare Other | Source: Ambulatory Visit | Attending: Family Medicine | Admitting: Family Medicine

## 2018-03-04 ENCOUNTER — Ambulatory Visit (HOSPITAL_BASED_OUTPATIENT_CLINIC_OR_DEPARTMENT_OTHER)
Admission: RE | Admit: 2018-03-04 | Discharge: 2018-03-04 | Disposition: A | Discharge status: Home / Self Care | Payer: Medicare Other | Source: Ambulatory Visit | Attending: Cardiology | Admitting: Cardiology

## 2018-03-04 VITALS — BP 150/92 | HR 83 | Wt 188.8 lb

## 2018-03-04 DIAGNOSIS — K219 Gastro-esophageal reflux disease without esophagitis: Secondary | ICD-10-CM | POA: Diagnosis not present

## 2018-03-04 DIAGNOSIS — C541 Malignant neoplasm of endometrium: Secondary | ICD-10-CM | POA: Diagnosis not present

## 2018-03-04 DIAGNOSIS — I700 Atherosclerosis of aorta: Secondary | ICD-10-CM | POA: Diagnosis not present

## 2018-03-04 DIAGNOSIS — Z803 Family history of malignant neoplasm of breast: Secondary | ICD-10-CM | POA: Diagnosis not present

## 2018-03-04 DIAGNOSIS — I059 Rheumatic mitral valve disease, unspecified: Secondary | ICD-10-CM | POA: Diagnosis not present

## 2018-03-04 DIAGNOSIS — E785 Hyperlipidemia, unspecified: Secondary | ICD-10-CM | POA: Diagnosis not present

## 2018-03-04 DIAGNOSIS — I119 Hypertensive heart disease without heart failure: Secondary | ICD-10-CM | POA: Insufficient documentation

## 2018-03-04 DIAGNOSIS — Z170 Estrogen receptor positive status [ER+]: Secondary | ICD-10-CM | POA: Diagnosis not present

## 2018-03-04 DIAGNOSIS — C50211 Malignant neoplasm of upper-inner quadrant of right female breast: Secondary | ICD-10-CM

## 2018-03-04 DIAGNOSIS — Z800 Family history of malignant neoplasm of digestive organs: Secondary | ICD-10-CM | POA: Insufficient documentation

## 2018-03-04 DIAGNOSIS — C50911 Malignant neoplasm of unspecified site of right female breast: Secondary | ICD-10-CM | POA: Diagnosis not present

## 2018-03-04 DIAGNOSIS — Z79899 Other long term (current) drug therapy: Secondary | ICD-10-CM | POA: Insufficient documentation

## 2018-03-04 NOTE — Patient Instructions (Signed)
Your physician recommends that you schedule a follow-up appointment in: 3 months with echocardiogram  

## 2018-03-04 NOTE — Progress Notes (Signed)
Oncologist: [name]  67 y.o. with history of HTN and hyperlipidemia with newly-diagnosed breast cancer and endometrial cancer. She was referred to cardio-oncology clinic by [name].   Breast cancer was diagnosed on the right in 8/18, ER+/PR+/HER2+. She has had lumpectomy and has had TCH x 6 cycles followed by Herceptin.   Endometrial cancer was also diagnosed in 8/18.  She has had hysterectomy/BSO in 9/18. She has finished radiation. She will be starting [facility] for endometrial cancer.   She is tolerating her treatment well.  No exertional dyspnea or chest pain.  BP high here today but is normal when she checks at home.   PMH: 1. GERD 2. HTN 3. Hyperlipidemia 4. Endometrial cancer: Diagnosed 8/18. Hysterectomy/BSO in 9/18.  5. Breast cancer: Diagnosed on the right in 8/18, ER+/PR-/HER2+.  S/p lumpectomy.  Plan for [place] x 6 cycles then Herceptin.  - Echo (8/18): EF 65-70%, GLS -18.2%, mild LVH.  - Echo (12/18): EF 60-65%, GLS -18.6%, normal RV size and systolic function.  - Echo (3/19): EF 60-65%, GLS -22.5%, normal RV size and systolic function.  - Echo (6/19): EF 60-65%, GLS -19.1%, normal RV size and systolic function.   Social History   Socioeconomic History  . Marital status: Married    Spouse name: Not on file  . Number of children: Not on file  . Years of education: Not on file  . Highest education level: Not on file  Occupational History    Employer: [employer]    Comment: Nurse, mental health research   Social Needs  . Financial resource strain: Not on file  . Food insecurity:    Worry: Not on file    Inability: Not on file  . Transportation needs:    Medical: Not on file    Non-medical: Not on file  Tobacco Use  . Smoking status: Never Smoker  . Smokeless tobacco: Never Used  Substance and Sexual Activity  . Alcohol use: Yes    Alcohol/week: 0.0 oz    Comment: <1/week wine or beer occasional  . Drug use: No  . Sexual activity: Yes    Birth  control/protection: Post-menopausal  Lifestyle  . Physical activity:    Days per week: Not on file    Minutes per session: Not on file  . Stress: Not on file  Relationships  . Social connections:    Talks on phone: Not on file    Gets together: Not on file    Attends religious service: Not on file    Active member of club or organization: Not on file    Attends meetings of clubs or organizations: Not on file    Relationship status: Not on file  . Intimate partner violence:    Fear of current or ex partner: Not on file    Emotionally abused: Not on file    Physically abused: Not on file    Forced sexual activity: Not on file  Other Topics Concern  . Not on file  Social History Narrative   2-3 caffeine drinks per day. Regular exercise.  Walking 3 miles a day.    Family History  Problem Relation Age of Onset  . Colon cancer Father 34  . Heart attack Father 22  . Prostate cancer Father        dx 1's  . Bladder Cancer Father 32  . Stroke Mother 45  . Diabetes Maternal Grandfather   . Kidney disease Maternal Grandfather   . Asthma Brother   .  Cancer Paternal Grandmother 64       GYN cancer ( thinks ovarian, maybe uterine)  . Heart attack Maternal Grandmother 70  . Breast cancer Other 33  . Colon cancer Other 71  . Cancer Other        type unk, age dx unk   ROS: All systems reviewed and negative except as per HPI.   Current Outpatient Medications  Medication Sig Dispense Refill  . acetaminophen (TYLENOL) 325 MG tablet Take 650 mg by mouth every 6 (six) hours as needed for mild pain or moderate pain.    . Ascorbic Acid (VITAMIN C) 1000 MG tablet Take 1,000 mg by mouth daily.    Marland Kitchen b complex vitamins capsule Take 1 capsule by mouth daily.    Marland Kitchen ibandronate (BONIVA) 150 MG tablet Take in the morning, once a month,  with a full glass of water,on an empty stomach,do not take anything else by mouth or lie down for the next 30 min. 3 tablet 3  . letrozole (FEMARA) 2.5 MG tablet  Take 1 tablet (2.5 mg total) by mouth daily. 30 tablet 2  . meclizine (ANTIVERT) 25 MG tablet Take 1-2 tablets (25-50 mg total) by mouth 3 (three) times daily as needed for dizziness. (Patient taking differently: Take 25-50 mg by mouth 3 (three) times daily as needed for dizziness (depends on dizziness if takes 1-2 tablets). ) 30 tablet 0  . Multiple Vitamin (MULTIVITAMIN) capsule Take 1 capsule by mouth daily.      Marland Kitchen olmesartan-hydrochlorothiazide (BENICAR HCT) 20-12.5 MG tablet Take 1 tablet by mouth daily. 90 tablet 2  . Pitavastatin Calcium (LIVALO) 2 MG TABS Take 1.5 tablets (3 mg total) by mouth every evening. Takes 1.5 tablet 135 tablet 3  . sodium chloride (MURO 128) 5 % ophthalmic ointment Place 1 application into both eyes 2 (two) times daily.      No current facility-administered medications for this encounter.    Blood pressure (!) 150/92, pulse 83, weight 188 lb 12.8 oz (85.6 kg), SpO2 96 %. General: NAD Neck: No JVD, no thyromegaly or thyroid nodule.  Lungs: Clear to auscultation bilaterally with normal respiratory effort. CV: Nondisplaced PMI.  Heart regular S1/S2, no S3/S4, no murmur.  No peripheral edema.  No carotid bruit.  Normal pedal pulses.  Abdomen: Soft, nontender, no hepatosplenomegaly, no distention.  Skin: Intact without lesions or rashes.  Neurologic: Alert and oriented x 3.  Psych: Normal affect. Extremities: No clubbing or cyanosis.  HEENT: Normal.   Assessment/Plan: 1. Breast cancer: ER+/PR-/HER2+. She is getting a Herceptin-based regimen. I reviewed today's echo.  LV EF and strain were normal. - Repeat echo in 3 months.    2. Endometrial cancer: s/p hysterectomy and BSO. The Keytruda that she will be starting does not appear to have cardiac side effects.  3. HTN: BP controlled when checked at home.   Loralie Champagne 03/04/2018

## 2018-03-04 NOTE — Progress Notes (Signed)
  Echocardiogram 2D Echocardiogram has been performed.  Dawn [name] 03/04/2018, 9:54 AM

## 2018-03-10 ENCOUNTER — Inpatient Hospital Stay: Payer: Medicare Other

## 2018-03-10 ENCOUNTER — Encounter: Payer: Self-pay | Admitting: Hematology

## 2018-03-10 ENCOUNTER — Inpatient Hospital Stay (HOSPITAL_BASED_OUTPATIENT_CLINIC_OR_DEPARTMENT_OTHER): Payer: Medicare Other | Admitting: Hematology

## 2018-03-10 ENCOUNTER — Telehealth: Payer: Self-pay | Admitting: Hematology

## 2018-03-10 VITALS — BP 134/77 | HR 67 | Temp 98.1°F | Resp 17 | Ht 63.0 in | Wt 191.4 lb

## 2018-03-10 DIAGNOSIS — Z923 Personal history of irradiation: Secondary | ICD-10-CM | POA: Diagnosis not present

## 2018-03-10 DIAGNOSIS — Z170 Estrogen receptor positive status [ER+]: Secondary | ICD-10-CM

## 2018-03-10 DIAGNOSIS — Z800 Family history of malignant neoplasm of digestive organs: Secondary | ICD-10-CM | POA: Diagnosis not present

## 2018-03-10 DIAGNOSIS — C50211 Malignant neoplasm of upper-inner quadrant of right female breast: Secondary | ICD-10-CM

## 2018-03-10 DIAGNOSIS — Z79811 Long term (current) use of aromatase inhibitors: Secondary | ICD-10-CM | POA: Diagnosis not present

## 2018-03-10 DIAGNOSIS — C541 Malignant neoplasm of endometrium: Secondary | ICD-10-CM

## 2018-03-10 DIAGNOSIS — M810 Age-related osteoporosis without current pathological fracture: Secondary | ICD-10-CM

## 2018-03-10 DIAGNOSIS — Z5112 Encounter for antineoplastic immunotherapy: Secondary | ICD-10-CM | POA: Diagnosis not present

## 2018-03-10 DIAGNOSIS — G620 Drug-induced polyneuropathy: Secondary | ICD-10-CM

## 2018-03-10 DIAGNOSIS — Z95828 Presence of other vascular implants and grafts: Secondary | ICD-10-CM

## 2018-03-10 DIAGNOSIS — Z8049 Family history of malignant neoplasm of other genital organs: Secondary | ICD-10-CM

## 2018-03-10 LAB — CBC WITH DIFFERENTIAL/PLATELET
BASOS ABS: 0 10*3/uL (ref 0.0–0.1)
Basophils Relative: 0 %
EOS PCT: 2 %
Eosinophils Absolute: 0.1 10*3/uL (ref 0.0–0.5)
HCT: 35.9 % (ref 34.8–46.6)
Hemoglobin: 12.7 g/dL (ref 11.6–15.9)
Lymphocytes Relative: 38 %
Lymphs Abs: 1.3 10*3/uL (ref 0.9–3.3)
MCH: 36 pg — ABNORMAL HIGH (ref 25.1–34.0)
MCHC: 35.2 g/dL (ref 31.5–36.0)
MCV: 102.1 fL — AB (ref 79.5–101.0)
MONO ABS: 0.4 10*3/uL (ref 0.1–0.9)
MONOS PCT: 11 %
Neutro Abs: 1.7 10*3/uL (ref 1.5–6.5)
Neutrophils Relative %: 49 %
PLATELETS: 136 10*3/uL — AB (ref 145–400)
RBC: 3.52 MIL/uL — ABNORMAL LOW (ref 3.70–5.45)
RDW: 13 % (ref 11.2–14.5)
WBC: 3.5 10*3/uL — ABNORMAL LOW (ref 3.9–10.3)

## 2018-03-10 LAB — COMPREHENSIVE METABOLIC PANEL
ALBUMIN: 3.8 g/dL (ref 3.5–5.0)
ALK PHOS: 56 U/L (ref 38–126)
ALT: 115 U/L — ABNORMAL HIGH (ref 0–44)
AST: 86 U/L — AB (ref 15–41)
Anion gap: 9 (ref 5–15)
BILIRUBIN TOTAL: 0.5 mg/dL (ref 0.3–1.2)
BUN: 10 mg/dL (ref 8–23)
CALCIUM: 9.1 mg/dL (ref 8.9–10.3)
CO2: 26 mmol/L (ref 22–32)
Chloride: 100 mmol/L (ref 98–111)
Creatinine, Ser: 0.78 mg/dL (ref 0.44–1.00)
GFR calc Af Amer: >60 mL/min (ref >60)
GFR calc non Af Amer: >60 mL/min (ref >60)
GLUCOSE: 153 mg/dL — AB (ref 70–99)
Potassium: 3.6 mmol/L (ref 3.5–5.1)
Sodium: 135 mmol/L (ref 135–145)
TOTAL PROTEIN: 7 g/dL (ref 6.5–8.1)

## 2018-03-10 MED ORDER — ACETAMINOPHEN 325 MG PO TABS
650.0000 mg | ORAL_TABLET | Freq: Once | ORAL | Status: AC
  Administered 2018-03-10: 650 mg via ORAL

## 2018-03-10 MED ORDER — SODIUM CHLORIDE 0.9% FLUSH
10.0000 mL | INTRAVENOUS | Status: DC | PRN
  Administered 2018-03-10: 10 mL
  Filled 2018-03-10: qty 10

## 2018-03-10 MED ORDER — DIPHENHYDRAMINE HCL 25 MG PO CAPS
50.0000 mg | ORAL_CAPSULE | Freq: Once | ORAL | Status: AC
  Administered 2018-03-10: 50 mg via ORAL

## 2018-03-10 MED ORDER — DIPHENHYDRAMINE HCL 25 MG PO CAPS
ORAL_CAPSULE | ORAL | Status: AC
  Filled 2018-03-10: qty 2

## 2018-03-10 MED ORDER — SODIUM CHLORIDE 0.9 % IV SOLN
200.0000 mg | Freq: Once | INTRAVENOUS | Status: AC
  Administered 2018-03-10: 200 mg via INTRAVENOUS
  Filled 2018-03-10: qty 8

## 2018-03-10 MED ORDER — ACETAMINOPHEN 325 MG PO TABS
ORAL_TABLET | ORAL | Status: AC
  Filled 2018-03-10: qty 2

## 2018-03-10 MED ORDER — SODIUM CHLORIDE 0.9% FLUSH
10.0000 mL | INTRAVENOUS | Status: DC | PRN
  Administered 2018-03-10: 10 mL via INTRAVENOUS
  Filled 2018-03-10: qty 10

## 2018-03-10 MED ORDER — HEPARIN SOD (PORK) LOCK FLUSH 100 UNIT/ML IV SOLN
500.0000 [IU] | Freq: Once | INTRAVENOUS | Status: AC | PRN
  Administered 2018-03-10: 500 [IU]
  Filled 2018-03-10: qty 5

## 2018-03-10 MED ORDER — TRASTUZUMAB CHEMO 150 MG IV SOLR
6.0000 mg/kg | Freq: Once | INTRAVENOUS | Status: AC
  Administered 2018-03-10: 525 mg via INTRAVENOUS
  Filled 2018-03-10: qty 25

## 2018-03-10 MED ORDER — SODIUM CHLORIDE 0.9 % IV SOLN
Freq: Once | INTRAVENOUS | Status: AC
  Administered 2018-03-10: 11:00:00 via INTRAVENOUS

## 2018-03-10 NOTE — Telephone Encounter (Signed)
Scheduled appt per 6/26 los - gave pt AVS and calender per los.  

## 2018-03-10 NOTE — Progress Notes (Signed)
[facility] [facility]  [phone] [phone]  Clinic Follow up Note   Patient Care Team: [name], MD as PCP - Haskell Riling, MD as Consulting Physician (General Surgery) Truitt Merle, MD as Consulting Physician (Hematology) [name], MD as Attending Physician (Radiation Oncology)   Date of Service:  03/10/2018  CHIEF COMPLAINTS:  Follow up right breast cancer and endometrial cancer   Oncology History   MSI high     Malignant neoplasm of upper-inner quadrant of right breast in female, estrogen receptor positive ([place])   04/27/2017 Initial Biopsy    Diagnosis Breast, right, needle core biopsy INVASIVE DUCTAL CARCINOMA, GRADE 2 Microscopic Comment The neoplasm has intracellular mucin and signet ring cell features. Immunostains shows these cells are positive for ER,GATA3, ck7 and GCDFP, negative for ck20, cdx2, TTF-1 and pax8, The immunostaining pattern supports the neoplasm is breast primary.      04/27/2017 Receptors her2    Estrogen Receptor: 70%, POSITIVE, MODERATE STAINING INTENSITY Progesterone Receptor: 0%, NEGATIVE Proliferation Marker Ki67: 30% HER2 - **POSITIVE** RATIO OF HER2/CEP17 SIGNALS 2.91 AVERAGE HER2 COPY NUMBER PER CELL 7.28      04/27/2017 Initial Diagnosis    Malignant neoplasm of upper-inner quadrant of right breast in female, estrogen receptor positive ([place])      05/07/2017 Imaging    CT Chest W Contrast 05/07/17 IMPRESSION: Tiny well-defined fatty lesion on the pleura at the right lung base. The thinner slice collimation used for today's chest CT eliminates volume-averaging seen in the lesion on the prior exam and confirms that this is a diffusely fatty nodule. This is a benign finding and likely represents a tiny lipoma. No defect in the hemidiaphragm evident to suggest tiny diaphragmatic hernia. Pulmonary hamartoma a consideration although the lack of soft tissue components makes this less  likely.      05/15/2017 Genetic Testing    Patient had genetic testing due to a personal history of breast cancer and uterine cancer as well as a family history of cancer. The Multi-Cancer panel was ordered. The Multi-Cancer Panel offered by [facility] includes sequencing and/or deletion duplication testing of the following 83 genes: ALK, APC, ATM, AXIN2,BAP1,  BARD1, BLM, BMPR1A, BRCA1, BRCA2, BRIP1, CASR, CDC73, CDH1, CDK4, CDKN1B, CDKN1C, CDKN2A (p14ARF), CDKN2A (p16INK4a), CEBPA, CHEK2, CTNNA1, DICER1, DIS3L2, EGFR (c.2369C>T, p.Thr790Met variant only), EPCAM (Deletion/duplication testing only), FH, FLCN, GATA2, GPC3, GREM1 (Promoter region deletion/duplication testing only), HOXB13 (c.251G>A, p.Gly84Glu), HRAS, KIT, MAX, MEN1, MET, MITF (c.952G>A, p.Glu318Lys variant only), MLH1, MSH2, MSH3, MSH6, MUTYH, NBN, NF1, NF2, NTHL1, PALB2, PDGFRA, PHOX2B, PMS2, POLD1, POLE, POT1, PRKAR1A, PTCH1, PTEN, RAD50, RAD51C, RAD51D, RB1, RECQL4, RET, RUNX1, SDHAF2, SDHA (sequence changes only), SDHB, SDHC, SDHD, SMAD4, SMARCA4, SMARCB1, SMARCE1, STK11, SUFU, TERC, TERT, TMEM127, TP53, TSC1, TSC2, VHL, WRN and WT1.   Results: No pathogenic mutations were identified. A VUS in the GATA2 gene c.1348G>A (p.Gly450Arg) was identified.  The date of this test report is 05/25/2017.        05/25/2017 Surgery    RIGHT BREAST LUMPECTOMY WITH RADIOACTIVE SEED AND  RIGHT AXILLARY SENTINEL LYMPH NODE BIOPSY and Pot placement by Dr. Excell Seltzer 05/25/17      05/25/2017 Pathology Results    Diagnosis 05/25/17 1. Breast, lumpectomy, right - INVASIVE DUCTAL CARCINOMA, GRADE II/III, SPANNING 2.2 CM. - DUCTAL CARCINOMA IN SITU, HIGH GRADE. - THE SURGICAL RESECTION MARGINS ARE NEGATIVE FOR CARCINOMA. - SEE ONCOLOGY TABLE BELOW. 2. Breast, excision, right additional superior margin - BENIGN FIBROADIPOSE TISSUE. - BENIGN SKELETAL MUSCLE. -  SEE COMMENT. 3. Breast, excision, right additional lateral margin - BENIGN BREAST PARENCHYMA. -  THERE IS NO EVIDENCE OF MALIGNANCY. - SEE COMMENT. 4. Breast, excision, chest wall margin - BENIGN FIBROADIPOSE TISSUE. - THERE IS NO EVIDENCE OF MALIGNANCY. - SEE COMMENT. 5. Lymph node, sentinel, biopsy, right axillary - THERE IS NO EVIDENCE OF CARCINOMA IN 1 OF 1 LYMPH NODE (0/1). 6. Lymph node, sentinel, biopsy, right axillary - THERE IS NO EVIDENCE OF CARCINOMA IN 1 OF 1 LYMPH NODE (0/1). 7. Lymph node, sentinel, biopsy, right axillary - THERE IS NO EVIDENCE OF CARCINOMA IN 1 OF 1 LYMPH NODE (0/1).      06/26/2017 PET scan    PET  IMPRESSION: 1. Postoperative findings both in the right breast and in the anatomic pelvis, with associated low-grade activity considered to be postoperative in nature. No hypermetabolic adenopathy or hypermetabolic lesions are identified to suggest active metastatic disease/malignancy. 2. Other imaging findings of potential clinical significance: Aortic Atherosclerosis (ICD10-I70.0). Sigmoid colon diverticulosis. Biapical pleuroparenchymal scarring in the lungs. Small amount of free pelvic fluid in the cul-de-sac, likely postoperative.       07/01/2017 - 10/14/2017 Chemotherapy    Adjuvant TCH with Onpro every 3 weeks for 6 cycles starting on 07/01/17, Changed Taxol to abraxane with cycle 4 due to drug rash reaction, followed by Herceptin every 3 weeks for 6 months       11/01/2017 - 12/08/2017 Radiation Therapy    Radiation therapy to her right breast with [name]      11/04/2017 -  Chemotherapy    Maintenance Herceptin starting 11/04/17 and will comeplete her 12 months in 06/2018       12/23/2017 -  Anti-estrogen oral therapy    Adjuvant Letrozole 2.5 mg daily       12/30/2017 Imaging    CT CAP W Contrast 12/30/17 IMPRESSION: Increased size of 1.1 cm aorto-caval retroperitoneal lymph node. This could be reactive due to interval hysterectomy, however metastatic carcinoma cannot be excluded. Consider continued attention on short-term  follow-up CT, or PET-CT scan for further evaluation.  Mild hepatic steatosis.      01/25/2018 PET scan    IMPRESSION: 1. Enlarging hypermetabolic aortocaval lymph node is worrisome for metastatic disease. 2. Probable postoperative seroma in the medial right breast, with associated mild inflammatory hypermetabolism.       Endometrial adenocarcinoma ([place])   05/07/2017 Initial Diagnosis    Endometrial adenocarcinoma ([place])      06/09/2017 Surgery    XI ROBOTIC ASSISTED TOTAL LAPOROSCOPIC HYSTERECTOMY WITH BILATERAL SALPINGO OOPHORECTOMY and SENTINEL NODE BIOPSY by Dr. Denman George 06/09/17      06/09/2017 Pathology Results    Diagnosis 06/09/17 1. Lymph node, sentinel, biopsy, right external iliac - METASTATIC ADENOCARCINOMA IN ONE LYMPH NODE (1/1). 2. Lymph node, sentinel, biopsy, left obturator - ONE BENIGN LYMPH NODE (0/1). 3. Lymph node, sentinel, biopsy, left external iliac - METASTATIC ADENOCARCINOMA IN ONE LYMPH NODE (1/1). 4. Uterus +/- tubes/ovaries, neoplastic ENDOMETRIUM: - ENDOMETRIAL ADENOCARCINOMA, 3.2 CM. - CARCINOMA INVADES INNER HALF OF MYOMETRIUM. 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 - CERVIX, BILATERAL FALLOPIAN TUBES AND BILATERAL OVARIES FREE OF TUMOR      08/18/2017 - 09/17/2017 Radiation Therapy    vaginal brachytherapy per [name] on starting 08/18/17       Genetic Testing    Patient has genetic testing done for MSI. 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       Endometrial cancer ([place])   06/09/2017 Initial  Diagnosis    Endometrial cancer ([place])      02/16/2018 -  Chemotherapy    The patient had pembrolizumab (KEYTRUDA) 200 mg in sodium chloride 0.9 % 50 mL chemo infusion, 200 mg, Intravenous, Once, 1 of 4 cycles Administration: 200 mg (02/17/2018)  for chemotherapy treatment.        HISTORY OF PRESENTING ILLNESS (05/06/2017):  Dawn [name] 67 y.o. female is here because of newly diagnosed breast cancer. Screening  mammogram detected right breast mass on 04/20/2017. Ultrasound showed 1.5 cm mass in the 1:00 position. The axilla was negative on ultrasound. Biopsy of the right breast was performed on 04/27/2017 revealing invasive ductal carcinoma, Grade 2, ER 70% / PR 0% / HER-2+ / Ki-67 30%.   Of note, she was also recently diagnosed with endometrial cancer. Biopsy of curettage endometrium on 04/29/2017 revealed adenocarcinoma. Immunohistology shows the tumor is positive with estrogen receptor, progesterone receptor with patchy positivity with carcinoembryonic antigen and p16. The tumor is negative with CD10 and vimentin. The morphology and immunophenotype are not definitive as to endocervical or endometrial origin. There are rare fragments with endometrial type stroma, which may indicate an endometrial origin although this is not definitive as to origin.  The patient presents today in our multidisciplinary breast clinic. She is doing well overall. She went to her PCP with abdominal pain and vaginal bleeding which felt like she was having her period all the time. Then she presented to the ER with severe vertigo and heavy vaginal bleeding. She has been having heavy vaginal bleeding since mid-July. She does not have a history of anemia and associates it with recent bleeding. She has been taking oral iron. She took an aspirin daily but, has not been taking it lately. She had been taking Evista for one year and stopped taking it on her own. She was taking this medication for her bone density. She takes 2-3 Tylenol daily to manage the abdominal pain / cramping discomfort. She believes this pain is associated with passing clots. She takes a multivitamin daily. The patient is apprehensive about chemotherapy and is unsure if she would like to proceed with chemotherapy if it is recommended. She reports leg cramping. She does not take potassium supplements but, does try to eat bananas. States liver function is typically elevated and has  been for about the last 2-3 years.   She has never smoked and doesn't drink alcohol. She did grow up with heavy smoking parents.   Father had colon cancer diagnosed at 37 years-old, and he died of bladder cancer. Paternal grandmother with some type of gynecologic cancer, age of onset in her mid-39s. Patient believes there was more cancer in her family in aunts and uncles however, there are no more living family members to discuss this with.   GYN HISTORY  Menarchal: 23 LMP: age of 28 Contraceptive: none  HRT: none  G2P2: She was 36 when she gave first live births.   CURRENT THERAPY:  -Maintenance Herceptin starting 11/04/17 and will complete 12 months in 06/2018 -Adjuvant letrozole 2.5 mg daily started 12/23/17 -Keytruda every 21 days started on 02/17/2018 for recurrent endometrial cancer  INTERVAL HISTORY:  Dawn [name] is here for a follow up. She presents to the clinic today accompanied by her husband. She is doing well overall.  She tolerated the first cycle Keytruda very well, without significant noticeable side effects.  She is back to work full-time, able to tolerate routine activity without any difficulties.  She noticed mild tightness  in the right upper quadrant of abdomen, especially when she laid down at night.  She does not need to take any pain medication.  No nausea, or other abdominal discomfort, no back pain or radiated pain to the right lower extremity.  She otherwise feels well no other complaints.   MEDICAL HISTORY:  Past Medical History:  Diagnosis Date  . Anemia    as a child.  . Arthritis   . Bilateral cataracts   . Bilateral leg cramps   . Cancer ([place])    skin  . Dyspnea   . Family history of bladder cancer   . Family history of colon cancer in father   . Fuchs' corneal dystrophy   . GERD (gastroesophageal reflux disease)   . Hearing loss    Right side 30%  . History of hiatal hernia    small size  . History of radiation therapy 08/21/17,  08/28/17,09/01/17, 09/11/17, 09/17/17   Vaginal cuff brachytherapy.   Marland Kitchen History of radiation therapy 11/11/17- 12/08/17   Right Breast treated to 40.05 Gy with 15 fx of 2.67 Gy and a boost of 10 Gy with 5 fx.   . Hyperlipidemia   . Hypertension   . IBS (irritable bowel syndrome)    hx of  . Malignant neoplasm of upper-inner quadrant of right female breast ([place]) 04/2017  . NAFL (nonalcoholic fatty liver)   . Obesity   . PONV (postoperative nausea and vomiting)   . 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 Uterine cancer ([place]) 04/2017   endometrial cancer  . Varices, gastric   . Vertigo     SURGICAL HISTORY: Past Surgical History:  Procedure Laterality Date  . BREAST BIOPSY Right 04/27/2017  . BREAST LUMPECTOMY WITH RADIOACTIVE SEED AND SENTINEL LYMPH NODE BIOPSY Right 05/25/2017   Procedure: RIGHT BREAST LUMPECTOMY WITH RADIOACTIVE SEED AND  RIGHT AXILLARY SENTINEL LYMPH NODE BIOPSY;  Surgeon: Excell Seltzer, MD;  Location: [facility];  Service: General;  Laterality: Right;  . CATARACT EXTRACTION, BILATERAL    . COLONOSCOPY    . HYSTEROSCOPY W/D&C N/A 04/29/2017   Procedure: DILATATION AND CURETTAGE /HYSTEROSCOPY;  Surgeon: Linda Hedges, DO;  Location: [facility];  Service: Gynecology;  Laterality: N/A;  . PORTACATH PLACEMENT Left 05/25/2017   Procedure: INSERTION PORT-A-CATH;  Surgeon: Excell Seltzer, MD;  Location: [facility];  Service: General;  Laterality: Left;  . ROBOTIC ASSISTED TOTAL HYSTERECTOMY WITH BILATERAL SALPINGO OOPHERECTOMY N/A 06/09/2017   Procedure: XI ROBOTIC ASSISTED TOTAL LAPOROSCOPIC HYSTERECTOMY WITH BILATERAL SALPINGO OOPHORECTOMY;  Surgeon: Everitt Amber, MD;  Location: [facility];  Service: Gynecology;  Laterality: N/A;  . SENTINEL NODE BIOPSY N/A 06/09/2017   Procedure: SENTINEL NODE BIOPSY;  Surgeon: Everitt Amber, MD;  Location: [facility];  Service: Gynecology;  Laterality: N/A;    SOCIAL HISTORY: Social  History   Socioeconomic History  . Marital status: Married    Spouse name: Not on file  . Number of children: Not on file  . Years of education: Not on file  . Highest education level: Not on file  Occupational History    Employer: [employer]    Comment: Nurse, mental health research   Social Needs  . Financial resource strain: Not on file  . Food insecurity:    Worry: Not on file    Inability: Not on file  . Transportation needs:    Medical: Not on file    Non-medical: Not on file  Tobacco Use  . Smoking status: Never Smoker  . Smokeless tobacco: Never Used  Substance and Sexual Activity  . Alcohol use: Yes    Alcohol/week: 0.0 oz    Comment: <1/week wine or beer occasional  . Drug use: No  . Sexual activity: Yes    Birth control/protection: Post-menopausal  Lifestyle  . Physical activity:    Days per week: Not on file    Minutes per session: Not on file  . Stress: Not on file  Relationships  . Social connections:    Talks on phone: Not on file    Gets together: Not on file    Attends religious service: Not on file    Active member of club or organization: Not on file    Attends meetings of clubs or organizations: Not on file    Relationship status: Not on file  . Intimate partner violence:    Fear of current or ex partner: Not on file    Emotionally abused: Not on file    Physically abused: Not on file    Forced sexual activity: Not on file  Other Topics Concern  . Not on file  Social History Narrative   2-3 caffeine drinks per day. Regular exercise.  Walking 3 miles a day.     FAMILY HISTORY: Family History  Problem Relation Age of Onset  . Colon cancer Father 76  . Heart attack Father 25  . Prostate cancer Father        dx 80's  . Bladder Cancer Father 55  . Stroke Mother 23  . Diabetes Maternal Grandfather   . Kidney disease Maternal Grandfather   . Asthma Brother   . Cancer Paternal Grandmother 36       GYN cancer ( thinks ovarian, maybe  uterine)  . Heart attack Maternal Grandmother 70  . Breast cancer Other 83  . Colon cancer Other 4  . Cancer Other        type unk, age dx unk    ALLERGIES:  is allergic to ciprofloxacin; crestor [rosuvastatin calcium]; fosamax [alendronate sodium]; penicillins; and taxol [paclitaxel].  MEDICATIONS:  Current Outpatient Medications  Medication Sig Dispense Refill  . acetaminophen (TYLENOL) 325 MG tablet Take 650 mg by mouth every 6 (six) hours as needed for mild pain or moderate pain.    . Ascorbic Acid (VITAMIN C) 1000 MG tablet Take 1,000 mg by mouth daily.    Marland Kitchen b complex vitamins capsule Take 1 capsule by mouth daily.    Marland Kitchen ibandronate (BONIVA) 150 MG tablet Take in the morning, once a month,  with a full glass of water,on an empty stomach,do not take anything else by mouth or lie down for the next 30 min. 3 tablet 3  . letrozole (FEMARA) 2.5 MG tablet Take 1 tablet (2.5 mg total) by mouth daily. 30 tablet 2  . meclizine (ANTIVERT) 25 MG tablet Take 1-2 tablets (25-50 mg total) by mouth 3 (three) times daily as needed for dizziness. (Patient taking differently: Take 25-50 mg by mouth 3 (three) times daily as needed for dizziness (depends on dizziness if takes 1-2 tablets). ) 30 tablet 0  . Multiple Vitamin (MULTIVITAMIN) capsule Take 1 capsule by mouth daily.      Marland Kitchen olmesartan-hydrochlorothiazide (BENICAR HCT) 20-12.5 MG tablet Take 1 tablet by mouth daily. 90 tablet 2  . Pitavastatin Calcium (LIVALO) 2 MG TABS Take 1.5 tablets (3 mg total) by mouth every evening. Takes 1.5 tablet 135 tablet 3  . sodium  chloride (MURO 128) 5 % ophthalmic ointment Place 1 application into both eyes 2 (two) times daily.      No current facility-administered medications for this visit.     REVIEW OF SYSTEMS:   Constitutional: Denies fevers, chills or abnormal night sweats  Eyes: Denies blurriness of vision, double vision or watery eyes Ears, nose, mouth, throat, and face: Denies mucositis or sore  throat Respiratory: Denies cough, dyspnea or wheezes Cardiovascular: Denies palpitation, chest discomfort or lower extremity swelling Gastrointestinal:  Denies nausea, vomiting, constipation, diarrhea, heartburn or change in bowel habits (+) Occasional abdominal pain, tolerable.  Skin: Denies abnormal skin rashes.  Lymphatics: Denies new lymphadenopathy or easy bruising Musculoskeletal: Negative Neurological:Denies numbness, new weaknesses, mild (+) neuropathy in fingers and feet Behavioral/Psych: Mood is stable, no new changes  All other systems were reviewed with the patient and are negative.  PHYSICAL EXAMINATION:  ECOG PERFORMANCE STATUS: 1 - Symptomatic but completely ambulatory  Vitals:   03/10/18 0919  BP: 134/77  Pulse: 67  Resp: 17  Temp: 98.1 F (36.7 C)  SpO2: 97%   Filed Weights   03/10/18 0919  Weight: 191 lb 6.4 oz (86.8 kg)     GENERAL:alert, no distress and comfortable SKIN: skin color, texture, turgor are normal, no rashes or significant lesions EYES: normal, conjunctiva are pink and non-injected, sclera clear OROPHARYNX:no exudate, no erythema and lips, buccal mucosa, and tongue normal  NECK: supple, thyroid normal size, non-tender, without nodularity LYMPH:  no palpable lymphadenopathy in the cervical, axillary or inguinal LUNGS: clear to auscultation with normal breathing effort HEART: regular rate & rhythm and no lower extremity edema (+) heart murmur in the aortic valve ABDOMEN:abdomen soft, non-tender and normal bowel sounds, no organomegaly.  Previous surgical scars have healed very well.   Musculoskeletal:no cyanosis of digits and no clubbing  PSYCH: alert & oriented x 3 with fluent speech NEURO: no focal motor/sensory deficits BREAST: deferred today   LABORATORY DATA:  I have reviewed the data as listed CBC Latest Ref Rng & Units 03/10/2018 02/17/2018 01/27/2018  WBC 3.9 - 10.3 K/uL 3.5(L) 3.7(L) 3.6(L)  Hemoglobin 11.6 - 15.9 g/dL 12.7 12.7 13.1   Hematocrit 34.8 - 46.6 % 35.9 37.0 37.8  Platelets 145 - 400 K/uL 136(L) 148 149    CMP Latest Ref Rng & Units 03/10/2018 02/17/2018 01/27/2018  Glucose 70 - 99 mg/dL 153(H) 120 126  BUN 8 - 23 mg/dL '10 12 12  '$ Creatinine 0.44 - 1.00 mg/dL 0.78 0.80 0.83  Sodium 135 - 145 mmol/L 135 136 137  Potassium 3.5 - 5.1 mmol/L 3.6 3.4(L) 3.4(L)  Chloride 98 - 111 mmol/L 100 100 102  CO2 22 - 32 mmol/L '26 26 28  '$ Calcium 8.9 - 10.3 mg/dL 9.1 9.1 9.3  Total Protein 6.5 - 8.1 g/dL 7.0 6.9 7.1  Total Bilirubin 0.3 - 1.2 mg/dL 0.5 0.6 0.5  Alkaline Phos 38 - 126 U/L 56 46 51  AST 15 - 41 U/L 86(H) 101(H) 85(H)  ALT 0 - 44 U/L 115(H) 153(H) 143(H)   PATHOLOGY FINDINGS:  Diagnosis 06/09/17 1. Lymph node, sentinel, biopsy, right external iliac - METASTATIC ADENOCARCINOMA IN ONE LYMPH NODE (1/1). 2. Lymph node, sentinel, biopsy, left obturator - ONE BENIGN LYMPH NODE (0/1). 3. Lymph node, sentinel, biopsy, left external iliac - METASTATIC ADENOCARCINOMA IN ONE LYMPH NODE (1/1). 4. Uterus +/- tubes/ovaries, neoplastic ENDOMETRIUM: - ENDOMETRIAL ADENOCARCINOMA, 3.2 CM. - CARCINOMA INVADES INNER HALF OF MYOMETRIUM. 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 - CERVIX, BILATERAL  FALLOPIAN TUBES AND BILATERAL OVARIES FREE OF TUMOR. Microscopic Comment 4. ONCOLOGY TABLE-UTERUS, CARCINOMA OR CARCINOSARCOMA Specimen: Uterus with bilateral fallopian tubes and ovaries, right and left external iliac nodes and left obturator lymph node. Procedure: Hysterectomy with bilateral salpingo-oophorectomy and lymph node biopsies. Lymph node sampling performed: Yes. Specimen integrity: Intact. Maximum tumor size: 3.2 cm. Histologic type: Mixed, endometrioid, clear cell and serous. Grade: III. Myometrial invasion: 0.8 cm where myometrium is 2.2 cm in thickness. Cervical stromal involvement: No. Extent of involvement of other organs: N/A. Lymph - vascular invasion: Present. Peritoneal washings: N/A. Lymph  nodes: Examined: 3 Sentinel 1 of 3 FINAL for Dawn [name], Dawn [name] ([id number]) Microscopic Comment(continued) 0 Non-sentinel 3 Total Lymph nodes with metastasis: 2. Isolated tumor cells (< 0.2 mm): 0. Micrometastasis: (> 0.2 mm and < 2.0 mm): 0. Macrometastasis: (> 2.0 mm): 2. Extracapsular extension: No. TNM code: pT1a, pN1. FIGO Stage (based on pathologic findings, needs clinical correlation): IIIC1. Comments: The tumor is 3.2 cm in greatest dimension and invades the myometrium to a depth of 0.8 cm where the myometrium is 2.2 cm in thickness and there is lymphatic vascular involvement by tumor. 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 (JDP:ah 06/10/17)   Diagnosis 05/25/17 1. Breast, lumpectomy, right - INVASIVE DUCTAL CARCINOMA, GRADE II/III, SPANNING 2.2 CM. - DUCTAL CARCINOMA IN SITU, HIGH GRADE. - THE SURGICAL RESECTION MARGINS ARE NEGATIVE FOR CARCINOMA. - SEE ONCOLOGY TABLE BELOW. 2. Breast, excision, right additional superior margin - BENIGN FIBROADIPOSE TISSUE. - BENIGN SKELETAL MUSCLE. - SEE COMMENT. 3. Breast, excision, right additional lateral margin - BENIGN BREAST PARENCHYMA. - THERE IS NO EVIDENCE OF MALIGNANCY. - SEE COMMENT. 4. Breast, excision, chest wall margin - BENIGN FIBROADIPOSE TISSUE. - THERE IS NO EVIDENCE OF MALIGNANCY. - SEE COMMENT. 5. Lymph node, sentinel, biopsy, right axillary - THERE IS NO EVIDENCE OF CARCINOMA IN 1 OF 1 LYMPH NODE (0/1). 6. Lymph node, sentinel, biopsy, right axillary - THERE IS NO EVIDENCE OF CARCINOMA IN 1 OF 1 LYMPH NODE (0/1). 7. Lymph node, sentinel, biopsy, right axillary - THERE IS NO EVIDENCE OF CARCINOMA IN 1 OF 1 LYMPH NODE (0/1). Microscopic Comment 1. BREAST, INVASIVE TUMOR Procedure: Seed localized lumpectomy, additional superior, lateral, and chest wall margin resections and axillary lymph node resections. Laterality: Right. Tumor Size: 2.2 cm (gross measurement). Histologic Type:  Ductal. Microscopic Comment(continued) Grade: II. Tubular Differentiation: 2. Nuclear Pleomorphism: 2. Mitotic Count: 2. Ductal Carcinoma in Situ (DCIS): Present, high grade. Extent of Tumor: Confined to breast parenchyma. Margins: Greater than or equal to 0.2 cm to all margins. Regional Lymph Nodes: Number of Lymph Nodes Examined: 3. Number of Sentinel Lymph Nodes Examined: 3. Lymph Nodes with Macrometastases: 0. Lymph Nodes with Micrometastases: 0. Lymph Nodes with Isolated Tumor Cells: 0. Breast Prognostic Profile: Case [id number]. Estrogen Receptor: 70%, moderate. Progesterone Receptor: 0%. Her2: Amplification was detected. The ratio was 2.91. Ki-67: 30%. Best tumor block for sendout testing: 1A-1C. Pathologic Stage Classification (pTNM, AJCC 8th Edition): Primary Tumor (pT): pT2. Regional Lymph Nodes (pN): pN0. Distant Metastases (pM): pMX. 2. - 4. The surgical resection margin(s) of the specimen were inked and microscopically evaluated.  Diagnosis 04/29/2017 Endometrium, curettage - ADENOCARCINOMA. - SEE MICROSCOPIC DESCRIPTION. Microscopic Comment There are fragments of adenocarcinoma, some of which have cribriform architecture, and there is focal extracellular mucin. Immunohistochemistry shows the tumor is positive with estrogen receptor, progesterone receptor with patchy positivity with carcinoembryonic antigen and p16. The tumor is negative with CD10 and vimentin. The morphology and  immunophenotype are not definitive as to endocervical or endometrial origin. There are rare fragments with endometrial type stroma, which may indicate an endometrial origin although this is not definitive as to origin.  Diagnosis 04/27/2017 Breast, right, needle core biopsy INVASIVE DUCTAL CARCINOMA, GRADE 2 Microscopic Comment The neoplasm has intracellular mucin and signet ring cell features. Immunostains shows these cells are positive for ER, GATA3, ck7 and GCDFP, negative for  ck20, cdx2, TTF-1 and pax8, The immunostaining pattern supports the neoplasm is breast primary. The Breast prognostic profile has been ordered. Results: IMMUNOHISTOCHEMICAL AND MORPHOMETRIC ANALYSIS PERFORMED MANUALLY Estrogen Receptor: 70%, POSITIVE, MODERATE STAINING INTENSITY Progesterone Receptor: 0%, NEGATIVE Proliferation Marker Ki67: 30% Results: HER2 - **POSITIVE** RATIO OF HER2/CEP17 SIGNALS 2.91 AVERAGE HER2 COPY NUMBER PER CELL 7.28  GENETICS 05/07/17   PROCEDURES  ECHO 11/30/17 Impressions: - Normal LV size and systolic function, EF 66-29%. Strain as above.   Normal RV size and systolic function. No significant valvular   abnormalities.  ECHO 08/27/17  Impressions: - Normal LV size and systolic function, EF 47-65%. Strain as above.   Mildly dilated RV with normal systolic function.  ECHO 05/07/17 Impressions: - Normal LV size with mild LV hypertrophy. EF 65-70%, vigorous   systolic function. Strain as above. Normal RV size and systolic   function. No significant valvular abnormalities.  RADIOGRAPHIC STUDIES: I have personally reviewed the radiological images as listed and agreed with the findings in the report. Ct Abdomen Pelvis W Contrast  Result Date: 02/24/2018 CLINICAL DATA:  History of endometrial cancer and right breast cancer diagnosed in August of 2018. Undergoing immunotherapy. EXAM: CT ABDOMEN AND PELVIS WITH CONTRAST TECHNIQUE: Multidetector CT imaging of the abdomen and pelvis was performed using the standard protocol following bolus administration of intravenous contrast. CONTRAST:  128m ISOVUE-300 IOPAMIDOL (ISOVUE-300) INJECTION 61% COMPARISON:  PET-CT 01/25/2018 and CT scan 12/30/2017 FINDINGS: Lower chest: No worrisome pulmonary nodules to suggest pulmonary metastatic disease. The heart is normal in size. No pericardial effusion. Small hiatal hernia stable. Small fatty nodule at the right lung base could be a small pleural lipoma or hamartoma.  Hepatobiliary: No focal hepatic lesions or intrahepatic biliary dilatation. The gallbladder is normal. No common bile duct dilatation. Pancreas: No mass, inflammation or ductal dilatation. Spleen: Normal size.  No focal lesions. Adrenals/Urinary Tract: Adrenal glands and kidneys are unremarkable. Stomach/Bowel: The stomach, duodenum, small bowel and colon are unremarkable. No acute inflammatory changes, mass lesions or obstructive findings. The terminal ileum and appendix are normal. Vascular/Lymphatic: The aorta and branch vessels are patent. The major venous structures are patent. Small scattered retroperitoneal lymph nodes. The largest node which was hypermetabolic on the PET-CT measures 13.5 mm on image number 43 and is unchanged since the PET-CT. Small adjacent left-sided retroperitoneal lymph nodes are stable also. No new or progressive findings. Small right-sided external iliac lymph nodes are stable. Index node on image number 69 measures 4.5 mm. Reproductive: Surgically absent. Other: No pelvic mass or adenopathy. No free pelvic fluid collections. No inguinal mass or adenopathy. Small periumbilical abdominal wall hernia containing fat. Musculoskeletal: No significant bony findings. IMPRESSION: 1. Stable retroperitoneal lymph nodes since prior PET-CT and abdominal CT scans from April and May of 2019. No new or progressive findings. 2. No worrisome pulmonary nodules at the lung bases. 3. No pelvic mass or pelvic lymphadenopathy. Electronically Signed   By: PMarijo SanesM.D.   On: 02/24/2018 10:26    ASSESSMENT & PLAN:  67y.o. 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 Malignant neoplasm of upper inner quadrant of right breast , Invasive ductal carcinoma, pT2N0M0, G2,  ER+/PR-/HER2+ -She underwent right breast lumpectomy with sentinel lymph node biopsy with Dr. Excell Seltzer on 05/25/2017 with port placement -Pathology  confirmed invasive ductal carcinoma, grade 2, spanning 2.2 cm, margins were clear, no positive lymph nodes, ER positive, PR negative, HER-2 positive. I reviewed this with the patient previously -We previously discussed the risk of cancer recurrence after completed surgical resection.  Due to the HER-2 positive disease, she is at moderate to high risk  I recommended adjuvant chemotherapy at that time. The regiment was selected to cover for endometrial cancer also. -She completed adjuvant TCH every 3 weeks with Onpro from 07/01/17-10/14/17. I changed Taxol to abraxane with cycle 4 due to drug rash reaction. She tolerated relatively well besides the rash and Grade 1 neuropathy. I encouraged her to take a B complex vitamin.  -She is currently on Herceptin maintenance therapy began on 11/04/17. Plan for a total of 12 months. Will complete in early Oct 2019.  -She started adjuvant radiation therapy on her breast on 11/01/17 with [name] and completed on 12/08/17. -She started Letrozole on 12/23/17 and is tolerating well with no present side effects at this time. Will continue for 5-7 years -continue breast cancer surveillance, due for mammogram in 04/2018, ordered    2. 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 Denman George on 06/09/17 -We previously discussed her surgical pathology. she has high risk features including lymphovascular involvement, nodal positive disease, and mixed histology with endometrioid, clear cell, and serous carcinoma  -We previously discussed the high risk of cancer recurrence after surgery extensively, and I strongly encouraged her to consider adjuvant chemotherapy to reduce her risk of recurrence. -She completed Taxol/carbo (taxol changed to abraxane) every 3 weeks x6 cycles on 10/14/17 and adjuvant vaginal brachytherapy from  08/18/17-09/17/17.  She tolerated well overall.  She experiences mild vaginal bleeding when using dilator as instructed per rad onc. -Her 12/30/17 CT scan showed 1.1 cm aorta-caval retroperitoneal lymph node which is borderline reactive. This was further evaluated by PET in 01/2018 which showed mild FDG activity, no other metastasis. -Her case was extensively discussed in GYN tumor board, especially with Drs. Cleda Clarks and Dr. Rhodia Albright at [facility]. The node is very difficult to biopsy, and the consensus is starting systemic therapy due to the high possibility of node recurrence.  -I discussed her endometrial cancer was tested for MSI and resulted MSI-high, which predicts good response to immunotherapy. She had genetic testing which was negative for Lynch syndrome.  -I recommend her to start immunotherapy Keytruda, instead of chemo, due to her MSI-H disease. Potential side effects, especially immune related disorders, endocrine disorders, pneumonitis, colitis, fatigue, skin rashes, liver toxicities ect. She voiced good understanding and agrees to proceed. -we discussed that the goal of therapy is palliative, for disease control, although there is small possibility immunotherapy may cure her recurrence.  -Advised that the patient proceed with an additional CT AP scan q 3-4 months while on immunotherapy. If there is no progression, then the patient will continue for up to 2 years on treatment.  -We reviewed her recent restaging scan from February 24, 2018, which showed stable retroperitoneal lymph node, no other new findings. -She is tolerating Keytruda very well, will continue every  3 weeks. -Plan to repeat a scan in 3 months in Oct   3. Genetics -Given her personal history of breast and endometrial cancer, and a family history of malignancy, she will be referred to see a genetic counselor for genetic testing to ruled out cancer syndromes. -Genetics results were negative   4. HTN -We monitored her Hypertension  while on chemo, she previously held HCTZ on days of chemo -F/u with PCP  5.  Transaminitis  - LFTs are chronically elevated due to fatty liver disease -We monitored her liver functions closely while on chemo, stable overall  -Will continue monitoring    6. Bone Health  -I previously discussed that aromatase inhibitors can potentially weaken the bone. I encouraged her to be active and to take a Vit D and Ca supplement.  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 I previously discussed this can take 1-2 years to recover.  -She will continue B complex. I previously recommended Neurontin for her tingling and pain. I discussed side effects. She would like to hold off for now.  -I previously encouraged her to remain active with exercise.  -Her neuropathy is overall mild and stable  8. RUQ abdominal discomfort  -She has noticed a mild tightness and discomfort in the right upper quadrant of abdomen, exam was negative recent CT scan was negative -I recommend her to try heating pads and NSAIDs as needed -We will continue monitoring  Follow-up: -Keytruda and herceptin today and continue every 3 weeks  -f/u in 6 weeks  -Continue letrozole   No orders of the defined types were placed in this encounter.   Pt and her husband had many questions. All questions were answered. The patient knows to call the clinic with any problems, questions or concerns.  I spent 25 minutes counseling the patient face to face. The total time spent in the appointment was 30 minutes and more than 50% was on counseling.   Truitt Merle  03/10/2018

## 2018-03-10 NOTE — Patient Instructions (Signed)
[facility] Discharge Instructions for Patients Receiving Chemotherapy  Today you received the following chemotherapy agents Herceptin and Keytruda  To help prevent nausea and vomiting after your treatment, we encourage you to take your nausea medication as directed   If you develop nausea and vomiting that is not controlled by your nausea medication, call the clinic.   BELOW ARE SYMPTOMS THAT SHOULD BE REPORTED IMMEDIATELY:  *FEVER GREATER THAN 100.5 F  *CHILLS WITH OR WITHOUT FEVER  NAUSEA AND VOMITING THAT IS NOT CONTROLLED WITH YOUR NAUSEA MEDICATION  *UNUSUAL SHORTNESS OF BREATH  *UNUSUAL BRUISING OR BLEEDING  TENDERNESS IN MOUTH AND THROAT WITH OR WITHOUT PRESENCE OF ULCERS  *URINARY PROBLEMS  *BOWEL PROBLEMS  UNUSUAL RASH Items with * indicate a potential emergency and should be followed up as soon as possible.  Feel free to call the clinic should you have any questions or concerns. The clinic phone number is [phone].  Please show the [facility] at check-in to the Emergency Department and triage nurse.  Pembrolizumab injection Beryle Flock) What is this medicine? PEMBROLIZUMAB (pem broe liz ue mab) is a monoclonal antibody. It is used to treat melanoma, head and neck cancer, Hodgkin lymphoma, non-small cell lung cancer, urothelial cancer, stomach cancer, and cancers that have a certain genetic condition. This medicine may be used for other purposes; ask your health care provider or pharmacist if you have questions. COMMON BRAND NAME(S): Keytruda What should I tell my health care provider before I take this medicine? They need to know if you have any of these conditions: -diabetes -immune system problems -inflammatory bowel disease -liver disease -lung or breathing disease -lupus -organ transplant -an unusual or allergic reaction to pembrolizumab, other medicines, foods, dyes, or preservatives -pregnant or trying to get  pregnant -breast-feeding How should I use this medicine? This medicine is for infusion into a vein. It is given by a health care professional in a hospital or clinic setting. A special MedGuide will be given to you before each treatment. Be sure to read this information carefully each time. Talk to your pediatrician regarding the use of this medicine in children. While this drug may be prescribed for selected conditions, precautions do apply. Overdosage: If you think you have taken too much of this medicine contact a poison control center or emergency room at once. NOTE: This medicine is only for you. Do not share this medicine with others. What if I miss a dose? It is important not to miss your dose. Call your doctor or health care professional if you are unable to keep an appointment. What may interact with this medicine? Interactions have not been studied. Give your health care provider a list of all the medicines, herbs, non-prescription drugs, or dietary supplements you use. Also tell them if you smoke, drink alcohol, or use illegal drugs. Some items may interact with your medicine. This list may not describe all possible interactions. Give your health care provider a list of all the medicines, herbs, non-prescription drugs, or dietary supplements you use. Also tell them if you smoke, drink alcohol, or use illegal drugs. Some items may interact with your medicine. What should I watch for while using this medicine? Your condition will be monitored carefully while you are receiving this medicine. You may need blood work done while you are taking this medicine. Do not become pregnant while taking this medicine or for 4 months after stopping it. Women should inform their doctor if they wish to become pregnant or  think they might be pregnant. There is a potential for serious side effects to an unborn child. Talk to your health care professional or pharmacist for more information. Do not breast-feed  an infant while taking this medicine or for 4 months after the last dose. What side effects may I notice from receiving this medicine? Side effects that you should report to your doctor or health care professional as soon as possible: -allergic reactions like skin rash, itching or hives, swelling of the face, lips, or tongue -bloody or black, tarry -breathing problems -changes in vision -chest pain -chills -constipation -cough -dizziness or feeling faint or lightheaded -fast or irregular heartbeat -fever -flushing -hair loss -low blood counts - this medicine may decrease the number of white blood cells, red blood cells and platelets. You may be at increased risk for infections and bleeding. -muscle pain -muscle weakness -persistent headache -signs and symptoms of high blood sugar such as dizziness; dry mouth; dry skin; fruity breath; nausea; stomach pain; increased hunger or thirst; increased urination -signs and symptoms of kidney injury like trouble passing urine or change in the amount of urine -signs and symptoms of liver injury like dark urine, light-colored stools, loss of appetite, nausea, right upper belly pain, yellowing of the eyes or skin -stomach pain -sweating -weight loss Side effects that usually do not require medical attention (report to your doctor or health care professional if they continue or are bothersome): -decreased appetite -diarrhea -tiredness This list may not describe all possible side effects. Call your doctor for medical advice about side effects. You may report side effects to FDA at 1-800-FDA-1088. Where should I keep my medicine? This drug is given in a hospital or clinic and will not be stored at home. NOTE: This sheet is a summary. It may not cover all possible information. If you have questions about this medicine, talk to your doctor, pharmacist, or health care provider.  2018 [employer]/Gold Standard (2016-06-10 12:29:36)

## 2018-03-30 ENCOUNTER — Telehealth: Payer: Self-pay

## 2018-03-30 NOTE — Telephone Encounter (Signed)
Faxed signed order for Mammogram to [facility].

## 2018-03-31 ENCOUNTER — Inpatient Hospital Stay: Payer: Medicare Other

## 2018-03-31 ENCOUNTER — Inpatient Hospital Stay: Payer: Medicare Other | Attending: Hematology

## 2018-03-31 VITALS — BP 131/69 | HR 75 | Temp 98.0°F | Resp 17 | Ht 63.0 in | Wt 186.8 lb

## 2018-03-31 DIAGNOSIS — Z170 Estrogen receptor positive status [ER+]: Secondary | ICD-10-CM | POA: Insufficient documentation

## 2018-03-31 DIAGNOSIS — C773 Secondary and unspecified malignant neoplasm of axilla and upper limb lymph nodes: Secondary | ICD-10-CM | POA: Insufficient documentation

## 2018-03-31 DIAGNOSIS — C50211 Malignant neoplasm of upper-inner quadrant of right female breast: Secondary | ICD-10-CM

## 2018-03-31 DIAGNOSIS — C541 Malignant neoplasm of endometrium: Secondary | ICD-10-CM

## 2018-03-31 DIAGNOSIS — Z95828 Presence of other vascular implants and grafts: Secondary | ICD-10-CM

## 2018-03-31 DIAGNOSIS — Z79899 Other long term (current) drug therapy: Secondary | ICD-10-CM | POA: Diagnosis not present

## 2018-03-31 DIAGNOSIS — C775 Secondary and unspecified malignant neoplasm of intrapelvic lymph nodes: Secondary | ICD-10-CM | POA: Diagnosis not present

## 2018-03-31 DIAGNOSIS — M810 Age-related osteoporosis without current pathological fracture: Secondary | ICD-10-CM | POA: Insufficient documentation

## 2018-03-31 DIAGNOSIS — Z5112 Encounter for antineoplastic immunotherapy: Secondary | ICD-10-CM | POA: Diagnosis not present

## 2018-03-31 LAB — CBC WITH DIFFERENTIAL/PLATELET
Basophils Absolute: 0 10*3/uL (ref 0.0–0.1)
Basophils Relative: 0 %
EOS PCT: 2 %
Eosinophils Absolute: 0.1 10*3/uL (ref 0.0–0.5)
HEMATOCRIT: 36.8 % (ref 34.8–46.6)
Hemoglobin: 13 g/dL (ref 11.6–15.9)
LYMPHS ABS: 1.3 10*3/uL (ref 0.9–3.3)
LYMPHS PCT: 44 %
MCH: 35.5 pg — AB (ref 25.1–34.0)
MCHC: 35.3 g/dL (ref 31.5–36.0)
MCV: 100.5 fL (ref 79.5–101.0)
MONO ABS: 0.4 10*3/uL (ref 0.1–0.9)
MONOS PCT: 12 %
NEUTROS ABS: 1.3 10*3/uL — AB (ref 1.5–6.5)
Neutrophils Relative %: 42 %
PLATELETS: 143 10*3/uL — AB (ref 145–400)
RBC: 3.66 MIL/uL — ABNORMAL LOW (ref 3.70–5.45)
RDW: 12.5 % (ref 11.2–14.5)
WBC: 3 10*3/uL — ABNORMAL LOW (ref 3.9–10.3)

## 2018-03-31 LAB — COMPREHENSIVE METABOLIC PANEL
ALBUMIN: 3.9 g/dL (ref 3.5–5.0)
ALT: 137 U/L — ABNORMAL HIGH (ref 0–44)
AST: 89 U/L — AB (ref 15–41)
Alkaline Phosphatase: 44 U/L (ref 38–126)
Anion gap: 9 (ref 5–15)
BUN: 10 mg/dL (ref 8–23)
CHLORIDE: 98 mmol/L (ref 98–111)
CO2: 28 mmol/L (ref 22–32)
Calcium: 9.4 mg/dL (ref 8.9–10.3)
Creatinine, Ser: 0.83 mg/dL (ref 0.44–1.00)
GFR calc Af Amer: >60 mL/min (ref >60)
GFR calc non Af Amer: >60 mL/min (ref >60)
GLUCOSE: 160 mg/dL — AB (ref 70–99)
POTASSIUM: 3.4 mmol/L — AB (ref 3.5–5.1)
SODIUM: 135 mmol/L (ref 135–145)
Total Bilirubin: 0.6 mg/dL (ref 0.3–1.2)
Total Protein: 7.1 g/dL (ref 6.5–8.1)

## 2018-03-31 LAB — TSH: TSH: 3.403 u[IU]/mL (ref 0.308–3.960)

## 2018-03-31 MED ORDER — SODIUM CHLORIDE 0.9% FLUSH
10.0000 mL | INTRAVENOUS | Status: DC | PRN
  Administered 2018-03-31: 10 mL via INTRAVENOUS
  Filled 2018-03-31: qty 10

## 2018-03-31 MED ORDER — ACETAMINOPHEN 325 MG PO TABS
650.0000 mg | ORAL_TABLET | Freq: Once | ORAL | Status: AC
  Administered 2018-03-31: 650 mg via ORAL

## 2018-03-31 MED ORDER — SODIUM CHLORIDE 0.9 % IV SOLN: Freq: Once | INTRAVENOUS | Status: AC

## 2018-03-31 MED ORDER — DIPHENHYDRAMINE HCL 25 MG PO CAPS
ORAL_CAPSULE | ORAL | Status: AC
  Filled 2018-03-31: qty 2

## 2018-03-31 MED ORDER — TRASTUZUMAB CHEMO 150 MG IV SOLR
6.0000 mg/kg | Freq: Once | INTRAVENOUS | Status: AC
  Administered 2018-03-31: 525 mg via INTRAVENOUS
  Filled 2018-03-31: qty 25

## 2018-03-31 MED ORDER — DIPHENHYDRAMINE HCL 25 MG PO CAPS
50.0000 mg | ORAL_CAPSULE | Freq: Once | ORAL | Status: AC
  Administered 2018-03-31: 50 mg via ORAL

## 2018-03-31 MED ORDER — ACETAMINOPHEN 325 MG PO TABS
ORAL_TABLET | ORAL | Status: AC
Start: 2018-03-31 — End: ?
  Filled 2018-03-31: qty 2

## 2018-03-31 MED ORDER — SODIUM CHLORIDE 0.9 % IV SOLN
200.0000 mg | Freq: Once | INTRAVENOUS | Status: AC
  Administered 2018-03-31: 200 mg via INTRAVENOUS
  Filled 2018-03-31: qty 8

## 2018-03-31 MED ORDER — HEPARIN SOD (PORK) LOCK FLUSH 100 UNIT/ML IV SOLN
500.0000 [IU] | Freq: Once | INTRAVENOUS | Status: AC | PRN
  Administered 2018-03-31: 500 [IU]
  Filled 2018-03-31: qty 5

## 2018-03-31 MED ORDER — SODIUM CHLORIDE 0.9% FLUSH
10.0000 mL | INTRAVENOUS | Status: DC | PRN
  Administered 2018-03-31: 10 mL
  Filled 2018-03-31: qty 10

## 2018-03-31 MED ORDER — SODIUM CHLORIDE 0.9 % IV SOLN
Freq: Once | INTRAVENOUS | Status: AC
  Administered 2018-03-31: 10:00:00 via INTRAVENOUS

## 2018-03-31 NOTE — Patient Instructions (Signed)
[facility] Discharge Instructions for Patients Receiving Chemotherapy  Today you received the following chemotherapy agents: Pembrolizumab (Keytruda) and Trastuzimab (Herceptin)  To help prevent nausea and vomiting after your treatment, we encourage you to take your nausea medication as prescribed.    If you develop nausea and vomiting that is not controlled by your nausea medication, call the clinic.   BELOW ARE SYMPTOMS THAT SHOULD BE REPORTED IMMEDIATELY:  *FEVER GREATER THAN 100.5 F  *CHILLS WITH OR WITHOUT FEVER  NAUSEA AND VOMITING THAT IS NOT CONTROLLED WITH YOUR NAUSEA MEDICATION  *UNUSUAL SHORTNESS OF BREATH  *UNUSUAL BRUISING OR BLEEDING  TENDERNESS IN MOUTH AND THROAT WITH OR WITHOUT PRESENCE OF ULCERS  *URINARY PROBLEMS  *BOWEL PROBLEMS  UNUSUAL RASH Items with * indicate a potential emergency and should be followed up as soon as possible.  Feel free to call the clinic should you have any questions or concerns. The clinic phone number is [phone].  Please show the [facility] at check-in to the Emergency Department and triage nurse.  Food Safety for the Immunocompromised Person If you are immunocompromised, it is important to follow food safety guidelines. Bacteria and other harmful germs are more likely to be in raw or fresh foods. Thoroughly cooking foods destroys these germs. Fresh vegetables should be cooked until tender; meats should be cooked until well-done; and eggs should be cooked until the yolks are firm. Dairy products, juices, and ciders should have the word "pasteurized" on the label. The following information can help you choose the right foods and prepare them correctly in order to keep you healthy. What do I need to know about food safety?  Wash your hands with soap and water before and after preparing food. Always wash your hands after touching raw meat.  Wash any surfaces that you will be using to prepare food. Use hot,  soapy water.  Keep foods separate when you are preparing and cooking a meal. Do not use the same knife or cutting board to cut your fresh produce and raw meat.  Cook food to the right temperature: ? Beef, pork, veal, lamb, and steak should be cooked to 145F (63C) with a 3-minute rest time. ? Fish should be cooked to 145F. ? Ground beef, pork, veal, and lamb should be cooked to 160F (71C). ? Egg dishes should be cooked to 160F. ? Poultry (whole, pieces, and ground) should be cooked to 165F (74C).  Put any leftovers in the refrigerator as soon as possible to stop bacteria from growing and to keep your food from going bad.  Hot foods should be kept at 150F (60C) or more, and cold foods should be kept at 50F (4.4C) or less. Preparation guidelines Cooking and Eating Utensil Preparation  Wash the following with soap and hot water before and after use: ? Countertops. ? Contractor. ? Cooking utensils. ? Silverware. ? Flatware. ? Pots and pans. ? Dishes. ? Glassware.  Air dry all cooking and eating utensils. Do not dry them with a cloth towel. Food Preparation  Do not buy food that has passed the expiration or "use by" date.  Wash your hands often for at least 20 seconds with warm, soapy water and dry them with paper towels. This is especially important after you have touched raw meat, eggs, or fish.  Wash fruits and vegetables thoroughly under cold running water before peeling or cutting them. Individually scrub produce that has a thick, rough skin or rind, such as cabbage. Do not  use commercial rinses to wash fruits and vegetables.  Rinse packaged salads, slaw mix, and other prepared produce under cold running water. Do this even if the food is labeled "prewashed."  Thaw frozen foods in the refrigerator overnight or quickly in the microwave. Do not thaw food on countertops.  Do not touch or use raw yeast. There is a risk that you could breathe it in. Raw yeast is used  to make bread.  Cook all perishable foods thoroughly.  Do not leave easily spoiled items at room temperature for more than 10-15 minutes.  Refrigerate leftovers as soon as possible in small, airtight, shallow containers.  Eat leftovers only if they have been stored properly. Do not eat leftovers that have been around for longer than 24 hours.  Boil marinades before using them on raw foods.  Clean the outside of your canned goods before opening them.  Do not put cooked food on a surface that you had placed raw meat, fish, or eggs on. Those surfaces must be washed with warm, soapy water before you use them again.  Always place raw meat, seafood, and eggs in plastic bags before putting them in your shopping cart at the grocery story. Also, bag those items separately and not with other food you have purchased. Once home, place them in your refrigerator right away. What foods can I not eat? Grains  Fresh bakery breads, muffins, cakes, donuts, and cream- or custard-filled cakes.  Raw or uncooked grain products.  Beer that has "unpasteurized" on the label, is made with uncooked brewer's yeast, is homemade or home brewed, or is from a microbrewery. Vegetables  Unwashedraw vegetables and salads.  Unpasteurized vegetable juice.  Raw vegetable sprouts, such as alfalfa, radish, broccoli, and mung bean.  Salads from the deli or salad bar. Fruits  Unwashed raw fruit.  Unpasteurized fruit juices.  Fresh apple cider. Meat and Other Protein Sources  All raw, uncooked, undercooked, or rare meat, fish, eggs, poultry, or tofu. This includes: ? Sushi. ? Partially cooked seafood, such as shrimp and crab. ? Raw shellfish, such as oysters, clams, mussels, and scallops and their juices. ? Refrigerated smoked seafood, including smoked salmon and lox.  Unpasteurized, refrigerated pates or meat spreads.  Unheated cold cuts from the deli, including hot dogs, dry or fermented sausage, or other  deli meat. These are okay if you heat them until they are steaming or reach 165F.  Hard cured salami in natural wrap.  Any meat, poultry, or seafood salad made at the grocery store or at [employer].  Pickled fish.  Any fermented foods such as tempeh or miso products.  Unprocessed nuts, unroasted raw nuts, and roasted nuts in the shell.  Food products made with raw or undercooked eggs, such as Caesar salad dressing, mayonnaise, homemade cookie dough, cake batters, and eggnog. While most products at the grocery store are made with pasteurized eggs, you still need to read the label to make sure. Do not eat anything that has the word "unpasteurized" on the label. Dairy  Soft cheeses made from unpasteurized milk or molds (such as feta, Brie, Camembert, and Gorgonzola), blue-veined cheese (such as Stilton and Roquefort), Mexican-style cheeses (such as [place]), and farmer's cheese.  Unpasteurized or raw milk cheese, yogurt, and other milk products.  Cheeses containing chili peppers or other uncooked vegetables.  Any imported cheeses.  Any cheese sliced at a deli. Beverages  Unboiled well water.  Cold-brewed tea or "sun teas" made with warm or cold water.  Mate  tea or yerba mate tea.  Raw, unpasteurized milk.  Eggnog or milkshakes made with raw eggs.  Unpasteurized fruit and vegetable juices.  Fresh apple cider.  Wine or beer that is unpasteurized, homemade or home brewed, or from a microbrewery. Condiments  Uncooked herbs or spices.  Raw or unpasteurized honey.  Prepackaged salsas stored in a refrigerated case. Sweets/Desserts  Unrefrigerated custard or cream-filled pastry products.  Soft-serve ice cream or frozen yogurt.  Hand-packed ice cream or frozen yogurt. Fats  Fresh salad dressings containing raw eggs or aged cheese (such as blue cheese and Roquefort) stored in a refrigerated case. This information is not intended to replace advice given to you  by your health care provider. Make sure you discuss any questions you have with your health care provider. Document Released: 06/29/2007 Document Revised: 02/07/2016 Document Reviewed: 01/31/2014 Elsevier Interactive Patient Education  [employer].

## 2018-03-31 NOTE — Patient Instructions (Signed)
[facility] Discharge Instructions for Patients Receiving Chemotherapy  Today you received the following chemotherapy agents: Pembrolizumab (Keytruda) and Trastuzumab (Herceptin)  To help prevent nausea and vomiting after your treatment, we encourage you to take your nausea medication as prescribed.    If you develop nausea and vomiting that is not controlled by your nausea medication, call the clinic.   BELOW ARE SYMPTOMS THAT SHOULD BE REPORTED IMMEDIATELY:  *FEVER GREATER THAN 100.5 F  *CHILLS WITH OR WITHOUT FEVER  NAUSEA AND VOMITING THAT IS NOT CONTROLLED WITH YOUR NAUSEA MEDICATION  *UNUSUAL SHORTNESS OF BREATH  *UNUSUAL BRUISING OR BLEEDING  TENDERNESS IN MOUTH AND THROAT WITH OR WITHOUT PRESENCE OF ULCERS  *URINARY PROBLEMS  *BOWEL PROBLEMS  UNUSUAL RASH Items with * indicate a potential emergency and should be followed up as soon as possible.  Feel free to call the clinic should you have any questions or concerns. The clinic phone number is [phone].  Please show the [facility] at check-in to the Emergency Department and triage nurse.

## 2018-03-31 NOTE — Progress Notes (Signed)
Per [name] okay to treatment with current lab results.

## 2018-04-14 MED FILL — LIDOCAINE-PRILOCAINE CREAM: 2.5-2.5 | 30 days supply | Qty: 30 | Fill #1

## 2018-04-20 ENCOUNTER — Telehealth: Payer: Self-pay | Admitting: Hematology

## 2018-04-20 NOTE — Progress Notes (Signed)
[place]  [phone] [phone]  Clinic Follow up Note   Patient Care Team: [name], MD as PCP - Haskell Riling, MD as Consulting Physician (General Surgery) Truitt Merle, MD as Consulting Physician (Hematology) [name], MD as Attending Physician (Radiation Oncology)   Date of Service:  04/21/2018  CHIEF COMPLAINTS:  Follow up right breast cancer and endometrial cancer    Oncology History   MSI high     Malignant neoplasm of upper-inner quadrant of right breast in female, estrogen receptor positive ([place])   04/27/2017 Initial Biopsy    Diagnosis Breast, right, needle core biopsy INVASIVE DUCTAL CARCINOMA, GRADE 2 Microscopic Comment The neoplasm has intracellular mucin and signet ring cell features. Immunostains shows these cells are positive for ER,GATA3, ck7 and GCDFP, negative for ck20, cdx2, TTF-1 and pax8, The immunostaining pattern supports the neoplasm is breast primary.      04/27/2017 Receptors her2    Estrogen Receptor: 70%, POSITIVE, MODERATE STAINING INTENSITY Progesterone Receptor: 0%, NEGATIVE Proliferation Marker Ki67: 30% HER2 - **POSITIVE** RATIO OF HER2/CEP17 SIGNALS 2.91 AVERAGE HER2 COPY NUMBER PER CELL 7.28      04/27/2017 Initial Diagnosis    Malignant neoplasm of upper-inner quadrant of right breast in female, estrogen receptor positive ([place])      05/07/2017 Imaging    CT Chest W Contrast 05/07/17 IMPRESSION: Tiny well-defined fatty lesion on the pleura at the right lung base. The thinner slice collimation used for today's chest CT eliminates volume-averaging seen in the lesion on the prior exam and confirms that this is a diffusely fatty nodule. This is a benign finding and likely represents a tiny lipoma. No defect in the hemidiaphragm evident to suggest tiny diaphragmatic hernia. Pulmonary hamartoma a consideration although the lack of soft tissue components makes this less  likely.      05/15/2017 Genetic Testing    Patient had genetic testing due to a personal history of breast cancer and uterine cancer as well as a family history of cancer. The Multi-Cancer panel was ordered. The Multi-Cancer Panel offered by [facility] includes sequencing and/or deletion duplication testing of the following 83 genes: ALK, APC, ATM, AXIN2,BAP1,  BARD1, BLM, BMPR1A, BRCA1, BRCA2, BRIP1, CASR, CDC73, CDH1, CDK4, CDKN1B, CDKN1C, CDKN2A (p14ARF), CDKN2A (p16INK4a), CEBPA, CHEK2, CTNNA1, DICER1, DIS3L2, EGFR (c.2369C>T, p.Thr790Met variant only), EPCAM (Deletion/duplication testing only), FH, FLCN, GATA2, GPC3, GREM1 (Promoter region deletion/duplication testing only), HOXB13 (c.251G>A, p.Gly84Glu), HRAS, KIT, MAX, MEN1, MET, MITF (c.952G>A, p.Glu318Lys variant only), MLH1, MSH2, MSH3, MSH6, MUTYH, NBN, NF1, NF2, NTHL1, PALB2, PDGFRA, PHOX2B, PMS2, POLD1, POLE, POT1, PRKAR1A, PTCH1, PTEN, RAD50, RAD51C, RAD51D, RB1, RECQL4, RET, RUNX1, SDHAF2, SDHA (sequence changes only), SDHB, SDHC, SDHD, SMAD4, SMARCA4, SMARCB1, SMARCE1, STK11, SUFU, TERC, TERT, TMEM127, TP53, TSC1, TSC2, VHL, WRN and WT1.   Results: No pathogenic mutations were identified. A VUS in the GATA2 gene c.1348G>A (p.Gly450Arg) was identified.  The date of this test report is 05/25/2017.        05/25/2017 Surgery    RIGHT BREAST LUMPECTOMY WITH RADIOACTIVE SEED AND  RIGHT AXILLARY SENTINEL LYMPH NODE BIOPSY and Pot placement by Dr. Excell Seltzer 05/25/17      05/25/2017 Pathology Results    Diagnosis 05/25/17 1. Breast, lumpectomy, right - INVASIVE DUCTAL CARCINOMA, GRADE II/III, SPANNING 2.2 CM. - DUCTAL CARCINOMA IN SITU, HIGH GRADE. - THE SURGICAL RESECTION MARGINS ARE NEGATIVE FOR CARCINOMA. - SEE ONCOLOGY TABLE BELOW. 2. Breast, excision, right additional superior margin - BENIGN FIBROADIPOSE TISSUE. - BENIGN SKELETAL MUSCLE. -  SEE COMMENT. 3. Breast, excision, right additional lateral margin - BENIGN BREAST PARENCHYMA. -  THERE IS NO EVIDENCE OF MALIGNANCY. - SEE COMMENT. 4. Breast, excision, chest wall margin - BENIGN FIBROADIPOSE TISSUE. - THERE IS NO EVIDENCE OF MALIGNANCY. - SEE COMMENT. 5. Lymph node, sentinel, biopsy, right axillary - THERE IS NO EVIDENCE OF CARCINOMA IN 1 OF 1 LYMPH NODE (0/1). 6. Lymph node, sentinel, biopsy, right axillary - THERE IS NO EVIDENCE OF CARCINOMA IN 1 OF 1 LYMPH NODE (0/1). 7. Lymph node, sentinel, biopsy, right axillary - THERE IS NO EVIDENCE OF CARCINOMA IN 1 OF 1 LYMPH NODE (0/1).      06/26/2017 PET scan    PET  IMPRESSION: 1. Postoperative findings both in the right breast and in the anatomic pelvis, with associated low-grade activity considered to be postoperative in nature. No hypermetabolic adenopathy or hypermetabolic lesions are identified to suggest active metastatic disease/malignancy. 2. Other imaging findings of potential clinical significance: Aortic Atherosclerosis (ICD10-I70.0). Sigmoid colon diverticulosis. Biapical pleuroparenchymal scarring in the lungs. Small amount of free pelvic fluid in the cul-de-sac, likely postoperative.       07/01/2017 - 10/14/2017 Chemotherapy    Adjuvant TCH with Onpro every 3 weeks for 6 cycles starting on 07/01/17, Changed Taxol to abraxane with cycle 4 due to drug rash reaction, followed by Herceptin every 3 weeks for 6 months       11/01/2017 - 12/08/2017 Radiation Therapy    Radiation therapy to her right breast with [name]      11/04/2017 -  Chemotherapy    Maintenance Herceptin starting 11/04/17 and will comeplete her 12 months in 06/2018       12/23/2017 -  Anti-estrogen oral therapy    Adjuvant Letrozole 2.5 mg daily       12/30/2017 Imaging    CT CAP W Contrast 12/30/17 IMPRESSION: Increased size of 1.1 cm aorto-caval retroperitoneal lymph node. This could be reactive due to interval hysterectomy, however metastatic carcinoma cannot be excluded. Consider continued attention on short-term  follow-up CT, or PET-CT scan for further evaluation.  Mild hepatic steatosis.      01/25/2018 PET scan    IMPRESSION: 1. Enlarging hypermetabolic aortocaval lymph node is worrisome for metastatic disease. 2. Probable postoperative seroma in the medial right breast, with associated mild inflammatory hypermetabolism.       Endometrial adenocarcinoma ([place])   05/07/2017 Initial Diagnosis    Endometrial adenocarcinoma ([place])      06/09/2017 Surgery    XI ROBOTIC ASSISTED TOTAL LAPOROSCOPIC HYSTERECTOMY WITH BILATERAL SALPINGO OOPHORECTOMY and SENTINEL NODE BIOPSY by Dr. Denman George 06/09/17      06/09/2017 Pathology Results    Diagnosis 06/09/17 1. Lymph node, sentinel, biopsy, right external iliac - METASTATIC ADENOCARCINOMA IN ONE LYMPH NODE (1/1). 2. Lymph node, sentinel, biopsy, left obturator - ONE BENIGN LYMPH NODE (0/1). 3. Lymph node, sentinel, biopsy, left external iliac - METASTATIC ADENOCARCINOMA IN ONE LYMPH NODE (1/1). 4. Uterus +/- tubes/ovaries, neoplastic ENDOMETRIUM: - ENDOMETRIAL ADENOCARCINOMA, 3.2 CM. - CARCINOMA INVADES INNER HALF OF MYOMETRIUM. 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 - CERVIX, BILATERAL FALLOPIAN TUBES AND BILATERAL OVARIES FREE OF TUMOR      08/18/2017 - 09/17/2017 Radiation Therapy    vaginal brachytherapy per [name] on starting 08/18/17       Genetic Testing    Patient has genetic testing done for MSI. 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       Endometrial cancer ([place])   06/09/2017 Initial  Diagnosis    Endometrial cancer ([place])      02/16/2018 -  Chemotherapy    The patient had pembrolizumab (KEYTRUDA) 200 mg in sodium chloride 0.9 % 50 mL chemo infusion, 200 mg, Intravenous, Once, 3 of 6 cycles Administration: 200 mg (02/17/2018), 200 mg (03/10/2018), 200 mg (03/31/2018)  for chemotherapy treatment.        HISTORY OF PRESENTING ILLNESS (05/06/2017):  Dawn [name] 67 y.o. female is here because of  newly diagnosed breast cancer. Screening mammogram detected right breast mass on 04/20/2017. Ultrasound showed 1.5 cm mass in the 1:00 position. The axilla was negative on ultrasound. Biopsy of the right breast was performed on 04/27/2017 revealing invasive ductal carcinoma, Grade 2, ER 70% / PR 0% / HER-2+ / Ki-67 30%.   Of note, she was also recently diagnosed with endometrial cancer. Biopsy of curettage endometrium on 04/29/2017 revealed adenocarcinoma. Immunohistology shows the tumor is positive with estrogen receptor, progesterone receptor with patchy positivity with carcinoembryonic antigen and p16. The tumor is negative with CD10 and vimentin. The morphology and immunophenotype are not definitive as to endocervical or endometrial origin. There are rare fragments with endometrial type stroma, which may indicate an endometrial origin although this is not definitive as to origin.  The patient presents today in our multidisciplinary breast clinic. She is doing well overall. She went to her PCP with abdominal pain and vaginal bleeding which felt like she was having her period all the time. Then she presented to the ER with severe vertigo and heavy vaginal bleeding. She has been having heavy vaginal bleeding since mid-July. She does not have a history of anemia and associates it with recent bleeding. She has been taking oral iron. She took an aspirin daily but, has not been taking it lately. She had been taking Evista for one year and stopped taking it on her own. She was taking this medication for her bone density. She takes 2-3 Tylenol daily to manage the abdominal pain / cramping discomfort. She believes this pain is associated with passing clots. She takes a multivitamin daily. The patient is apprehensive about chemotherapy and is unsure if she would like to proceed with chemotherapy if it is recommended. She reports leg cramping. She does not take potassium supplements but, does try to eat bananas. States  liver function is typically elevated and has been for about the last 2-3 years.   She has never smoked and doesn't drink alcohol. She did grow up with heavy smoking parents.   Father had colon cancer diagnosed at 84 years-old, and he died of bladder cancer. Paternal grandmother with some type of gynecologic cancer, age of onset in her mid-59s. Patient believes there was more cancer in her family in aunts and uncles however, there are no more living family members to discuss this with.   GYN HISTORY  Menarchal: 37 LMP: age of 42 Contraceptive: none  HRT: none  G2P2: She was 89 when she gave first live births.   CURRENT THERAPY:  -Maintenance Herceptin starting 11/04/17 and will complete 12 months in 06/2018 -Adjuvant letrozole 2.5 mg daily started 12/23/17, switched to exemestane on 04/21/18 due to joint pain  -Keytruda every 21 days started on 02/17/2018 for recurrent endometrial cancer  INTERVAL HISTORY:   Dawn [name] is here for a follow up of her breast, endometrial cancer and ongoing treatment. She presents to the clinic today accompanied by her husband. She notes she is doing well overall but has aching in her left shoulder  and low back as well as joint stiffness in her knees. She has been on monthly boniva for 3 months. She plans to get DEXA in 05/2018 and Mammogram in 06/2018. She notes she is concerned that her Vitamin D level may be low. She has been taking a multivitamin.   She notes her neuropathy is slowly improving.    She provided [place] as she needs proof of her diagnosis. She also wants to only work 2 days a week for 8 hours a day. She does not plan to retire this year.  She plans to travel to [place] soon and return in 05/2018.     MEDICAL HISTORY:  Past Medical History:  Diagnosis Date  . Anemia    as a child.  . Arthritis   . Bilateral cataracts   . Bilateral leg cramps   . Cancer ([place])    skin  . Dyspnea   . Family history of bladder  cancer   . Family history of colon cancer in father   . Fuchs' corneal dystrophy   . GERD (gastroesophageal reflux disease)   . Hearing loss    Right side 30%  . History of hiatal hernia    small size  . History of radiation therapy 08/21/17, 08/28/17,09/01/17, 09/11/17, 09/17/17   Vaginal cuff brachytherapy.   Marland Kitchen History of radiation therapy 11/11/17- 12/08/17   Right Breast treated to 40.05 Gy with 15 fx of 2.67 Gy and a boost of 10 Gy with 5 fx.   . Hyperlipidemia   . Hypertension   . IBS (irritable bowel syndrome)    hx of  . Malignant neoplasm of upper-inner quadrant of right female breast ([place]) 04/2017  . NAFL (nonalcoholic fatty liver)   . Obesity   . PONV (postoperative nausea and vomiting)   . 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 Uterine cancer ([place]) 04/2017   endometrial cancer  . Varices, gastric   . Vertigo     SURGICAL HISTORY: Past Surgical History:  Procedure Laterality Date  . BREAST BIOPSY Right 04/27/2017  . BREAST LUMPECTOMY WITH RADIOACTIVE SEED AND SENTINEL LYMPH NODE BIOPSY Right 05/25/2017   Procedure: RIGHT BREAST LUMPECTOMY WITH RADIOACTIVE SEED AND  RIGHT AXILLARY SENTINEL LYMPH NODE BIOPSY;  Surgeon: Excell Seltzer, MD;  Location: [facility];  Service: General;  Laterality: Right;  . CATARACT EXTRACTION, BILATERAL    . COLONOSCOPY    . HYSTEROSCOPY W/D&C N/A 04/29/2017   Procedure: DILATATION AND CURETTAGE /HYSTEROSCOPY;  Surgeon: Linda Hedges, DO;  Location: [facility];  Service: Gynecology;  Laterality: N/A;  . PORTACATH PLACEMENT Left 05/25/2017   Procedure: INSERTION PORT-A-CATH;  Surgeon: Excell Seltzer, MD;  Location: [facility];  Service: General;  Laterality: Left;  . ROBOTIC ASSISTED TOTAL HYSTERECTOMY WITH BILATERAL SALPINGO OOPHERECTOMY N/A 06/09/2017   Procedure: XI ROBOTIC ASSISTED TOTAL LAPOROSCOPIC HYSTERECTOMY WITH BILATERAL SALPINGO OOPHORECTOMY;  Surgeon: Everitt Amber, MD;   Location: [facility];  Service: Gynecology;  Laterality: N/A;  . SENTINEL NODE BIOPSY N/A 06/09/2017   Procedure: SENTINEL NODE BIOPSY;  Surgeon: Everitt Amber, MD;  Location: [facility];  Service: Gynecology;  Laterality: N/A;    SOCIAL HISTORY: Social History   Socioeconomic History  . Marital status: Married    Spouse name: Not on file  . Number of children: Not on file  . Years of education: Not on file  . Highest education level: Not on file  Occupational History  Employer: [employer]    Comment: Museum/gallery conservator   Social Needs  . Financial resource strain: Not on file  . Food insecurity:    Worry: Not on file    Inability: Not on file  . Transportation needs:    Medical: Not on file    Non-medical: Not on file  Tobacco Use  . Smoking status: Never Smoker  . Smokeless tobacco: Never Used  Substance and Sexual Activity  . Alcohol use: Yes    Alcohol/week: 0.0 oz    Comment: <1/week wine or beer occasional  . Drug use: No  . Sexual activity: Yes    Birth control/protection: Post-menopausal  Lifestyle  . Physical activity:    Days per week: Not on file    Minutes per session: Not on file  . Stress: Not on file  Relationships  . Social connections:    Talks on phone: Not on file    Gets together: Not on file    Attends religious service: Not on file    Active member of club or organization: Not on file    Attends meetings of clubs or organizations: Not on file    Relationship status: Not on file  . Intimate partner violence:    Fear of current or ex partner: Not on file    Emotionally abused: Not on file    Physically abused: Not on file    Forced sexual activity: Not on file  Other Topics Concern  . Not on file  Social History Narrative   2-3 caffeine drinks per day. Regular exercise.  Walking 3 miles a day.     FAMILY HISTORY: Family History  Problem Relation Age of Onset  . Colon cancer Father 38  . Heart attack Father 50  . Prostate  cancer Father        dx 87's  . Bladder Cancer Father 45  . Stroke Mother 63  . Diabetes Maternal Grandfather   . Kidney disease Maternal Grandfather   . Asthma Brother   . Cancer Paternal Grandmother 11       GYN cancer ( thinks ovarian, maybe uterine)  . Heart attack Maternal Grandmother 70  . Breast cancer Other 29  . Colon cancer Other 97  . Cancer Other        type unk, age dx unk    ALLERGIES:  is allergic to ciprofloxacin; crestor [rosuvastatin calcium]; fosamax [alendronate sodium]; penicillins; and taxol [paclitaxel].  MEDICATIONS:  Current Outpatient Medications  Medication Sig Dispense Refill  . acetaminophen (TYLENOL) 325 MG tablet Take 650 mg by mouth every 6 (six) hours as needed for mild pain or moderate pain.    . Ascorbic Acid (VITAMIN C) 1000 MG tablet Take 1,000 mg by mouth daily.    Marland Kitchen b complex vitamins capsule Take 1 capsule by mouth daily.    Marland Kitchen ibandronate (BONIVA) 150 MG tablet Take in the morning, once a month,  with a full glass of water,on an empty stomach,do not take anything else by mouth or lie down for the next 30 min. 3 tablet 3  . letrozole (FEMARA) 2.5 MG tablet Take 1 tablet (2.5 mg total) by mouth daily. 30 tablet 2  . lidocaine-prilocaine (EMLA) cream   2  . meclizine (ANTIVERT) 25 MG tablet Take 1-2 tablets (25-50 mg total) by mouth 3 (three) times daily as needed for dizziness. (Patient taking differently: Take 25-50 mg by mouth 3 (three) times daily as needed for dizziness (depends on  dizziness if takes 1-2 tablets). ) 30 tablet 0  . Multiple Vitamin (MULTIVITAMIN) capsule Take 1 capsule by mouth daily.      Marland Kitchen olmesartan-hydrochlorothiazide (BENICAR HCT) 20-12.5 MG tablet Take 1 tablet by mouth daily. 90 tablet 2  . Pitavastatin Calcium (LIVALO) 2 MG TABS Take 1.5 tablets (3 mg total) by mouth every evening. Takes 1.5 tablet 135 tablet 3  . sodium chloride (MURO 128) 5 % ophthalmic ointment Place 1 application into both eyes 2 (two) times daily.      Marland Kitchen exemestane (AROMASIN) 25 MG tablet Take 1 tablet (25 mg total) by mouth daily after breakfast. 30 tablet 3   No current facility-administered medications for this visit.     REVIEW OF SYSTEMS:   Constitutional: Denies fevers, chills or abnormal night sweats  Eyes: Denies blurriness of vision, double vision or watery eyes Ears, nose, mouth, throat, and face: Denies mucositis or sore throat Respiratory: Denies cough, dyspnea or wheezes Cardiovascular: Denies palpitation, chest discomfort or lower extremity swelling Gastrointestinal:  Denies nausea, vomiting, constipation, diarrhea, heartburn or change in bowel habits  Skin: Denies abnormal skin rashes.  Lymphatics: Denies new lymphadenopathy or easy bruising Musculoskeletal: (+) Musc/joint pain in her left shoulder and low back, (+) joint stiffness in her knees Neurological:Denies numbness, new weaknesses, mild (+) neuropathy in fingers and feet, slowly improving  Behavioral/Psych: Mood is stable, no new changes  All other systems were reviewed with the patient and are negative.  PHYSICAL EXAMINATION:  ECOG PERFORMANCE STATUS: 1 - Symptomatic but completely ambulatory  Vitals:   04/21/18 1033  BP: 133/75  Pulse: 76  Resp: 17  Temp: 98 F (36.7 C)  SpO2: 97%   Filed Weights   04/21/18 1033  Weight: 187 lb 6.4 oz (85 kg)     GENERAL:alert, no distress and comfortable SKIN: skin color, texture, turgor are normal, no rashes or significant lesions EYES: normal, conjunctiva are pink and non-injected, sclera clear OROPHARYNX:no exudate, no erythema and lips, buccal mucosa, and tongue normal  NECK: supple, thyroid normal size, non-tender, without nodularity LYMPH:  no palpable lymphadenopathy in the cervical, axillary or inguinal LUNGS: clear to auscultation with normal breathing effort HEART: regular rate & rhythm and no lower extremity edema (+) heart murmur in the aortic valve ABDOMEN:abdomen soft, non-tender and normal  bowel sounds, no organomegaly.  Previous surgical scars have healed very well.   Musculoskeletal:no cyanosis of digits and no clubbing  PSYCH: alert & oriented x 3 with fluent speech NEURO: no focal motor/sensory deficits BREAST: deferred today   LABORATORY DATA:  I have reviewed the data as listed CBC Latest Ref Rng & Units 04/21/2018 03/31/2018 03/10/2018  WBC 3.9 - 10.3 K/uL 3.2(L) 3.0(L) 3.5(L)  Hemoglobin 11.6 - 15.9 g/dL 13.3 13.0 12.7  Hematocrit 34.8 - 46.6 % 38.3 36.8 35.9  Platelets 145 - 400 K/uL 151 143(L) 136(L)    CMP Latest Ref Rng & Units 04/21/2018 03/31/2018 03/10/2018  Glucose 70 - 99 mg/dL 135(H) 160(H) 153(H)  BUN 8 - 23 mg/dL _0 Creatinine 0.44 - 1.00 mg/dL 0.79 0.83 0.78  Sodium 135 - 145 mmol/L 136 135 135  Potassium 3.5 - 5.1 mmol/L 3.4(L) 3.4(L) 3.6  Chloride 98 - 111 mmol/L 100 98 100  CO2 22 - 32 mmol/L _1 Calcium 8.9 - 10.3 mg/dL 9.2 9.4 9.1  Total Protein 6.5 - 8.1 g/dL 7.2 7.1 7.0  Total Bilirubin 0.3 - 1.2 mg/dL 0.6 0.6 0.5  Alkaline Phos 38 -  126 U/L 52 44 56  AST 15 - 41 U/L 85(H) 89(H) 86(H)  ALT 0 - 44 U/L 143(H) 137(H) 115(H)   PATHOLOGY FINDINGS:  Diagnosis 06/09/17 1. Lymph node, sentinel, biopsy, right external iliac - METASTATIC ADENOCARCINOMA IN ONE LYMPH NODE (1/1). 2. Lymph node, sentinel, biopsy, left obturator - ONE BENIGN LYMPH NODE (0/1). 3. Lymph node, sentinel, biopsy, left external iliac - METASTATIC ADENOCARCINOMA IN ONE LYMPH NODE (1/1). 4. Uterus +/- tubes/ovaries, neoplastic ENDOMETRIUM: - ENDOMETRIAL ADENOCARCINOMA, 3.2 CM. - CARCINOMA INVADES INNER HALF OF MYOMETRIUM. 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 - CERVIX, BILATERAL FALLOPIAN TUBES AND BILATERAL OVARIES FREE OF TUMOR. Microscopic Comment 4. ONCOLOGY TABLE-UTERUS, CARCINOMA OR CARCINOSARCOMA Specimen: Uterus with bilateral fallopian tubes and ovaries, right and left external iliac nodes and left obturator lymph node. Procedure: Hysterectomy  with bilateral salpingo-oophorectomy and lymph node biopsies. Lymph node sampling performed: Yes. Specimen integrity: Intact. Maximum tumor size: 3.2 cm. Histologic type: Mixed, endometrioid, clear cell and serous. Grade: III. Myometrial invasion: 0.8 cm where myometrium is 2.2 cm in thickness. Cervical stromal involvement: No. Extent of involvement of other organs: N/A. Lymph - vascular invasion: Present. Peritoneal washings: N/A. Lymph nodes: Examined: 3 Sentinel 1 of 3 FINAL for Dawn [name], Dawn [name] ([id number]) Microscopic Comment(continued) 0 Non-sentinel 3 Total Lymph nodes with metastasis: 2. Isolated tumor cells (< 0.2 mm): 0. Micrometastasis: (> 0.2 mm and < 2.0 mm): 0. Macrometastasis: (> 2.0 mm): 2. Extracapsular extension: No. TNM code: pT1a, pN1. FIGO Stage (based on pathologic findings, needs clinical correlation): IIIC1. Comments: The tumor is 3.2 cm in greatest dimension and invades the myometrium to a depth of 0.8 cm where the myometrium is 2.2 cm in thickness and there is lymphatic vascular involvement by tumor. 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 (JDP:ah 06/10/17)   Diagnosis 05/25/17 1. Breast, lumpectomy, right - INVASIVE DUCTAL CARCINOMA, GRADE II/III, SPANNING 2.2 CM. - DUCTAL CARCINOMA IN SITU, HIGH GRADE. - THE SURGICAL RESECTION MARGINS ARE NEGATIVE FOR CARCINOMA. - SEE ONCOLOGY TABLE BELOW. 2. Breast, excision, right additional superior margin - BENIGN FIBROADIPOSE TISSUE. - BENIGN SKELETAL MUSCLE. - SEE COMMENT. 3. Breast, excision, right additional lateral margin - BENIGN BREAST PARENCHYMA. - THERE IS NO EVIDENCE OF MALIGNANCY. - SEE COMMENT. 4. Breast, excision, chest wall margin - BENIGN FIBROADIPOSE TISSUE. - THERE IS NO EVIDENCE OF MALIGNANCY. - SEE COMMENT. 5. Lymph node, sentinel, biopsy, right axillary - THERE IS NO EVIDENCE OF CARCINOMA IN 1 OF 1 LYMPH NODE (0/1). 6. Lymph node, sentinel, biopsy, right  axillary - THERE IS NO EVIDENCE OF CARCINOMA IN 1 OF 1 LYMPH NODE (0/1). 7. Lymph node, sentinel, biopsy, right axillary - THERE IS NO EVIDENCE OF CARCINOMA IN 1 OF 1 LYMPH NODE (0/1). Microscopic Comment 1. BREAST, INVASIVE TUMOR Procedure: Seed localized lumpectomy, additional superior, lateral, and chest wall margin resections and axillary lymph node resections. Laterality: Right. Tumor Size: 2.2 cm (gross measurement). Histologic Type: Ductal. Microscopic Comment(continued) Grade: II. Tubular Differentiation: 2. Nuclear Pleomorphism: 2. Mitotic Count: 2. Ductal Carcinoma in Situ (DCIS): Present, high grade. Extent of Tumor: Confined to breast parenchyma. Margins: Greater than or equal to 0.2 cm to all margins. Regional Lymph Nodes: Number of Lymph Nodes Examined: 3. Number of Sentinel Lymph Nodes Examined: 3. Lymph Nodes with Macrometastases: 0. Lymph Nodes with Micrometastases: 0. Lymph Nodes with Isolated Tumor Cells: 0. Breast Prognostic Profile: Case [id number]. Estrogen Receptor: 70%, moderate. Progesterone Receptor: 0%. Her2: Amplification was detected. The ratio was 2.91. Ki-67: 30%.  Best tumor block for sendout testing: 1A-1C. Pathologic Stage Classification (pTNM, AJCC 8th Edition): Primary Tumor (pT): pT2. Regional Lymph Nodes (pN): pN0. Distant Metastases (pM): pMX. 2. - 4. The surgical resection margin(s) of the specimen were inked and microscopically evaluated.  Diagnosis 04/29/2017 Endometrium, curettage - ADENOCARCINOMA. - SEE MICROSCOPIC DESCRIPTION. Microscopic Comment There are fragments of adenocarcinoma, some of which have cribriform architecture, and there is focal extracellular mucin. Immunohistochemistry shows the tumor is positive with estrogen receptor, progesterone receptor with patchy positivity with carcinoembryonic antigen and p16. The tumor is negative with CD10 and vimentin. The morphology and immunophenotype are not definitive as to  endocervical or endometrial origin. There are rare fragments with endometrial type stroma, which may indicate an endometrial origin although this is not definitive as to origin.  Diagnosis 04/27/2017 Breast, right, needle core biopsy INVASIVE DUCTAL CARCINOMA, GRADE 2 Microscopic Comment The neoplasm has intracellular mucin and signet ring cell features. Immunostains shows these cells are positive for ER, GATA3, ck7 and GCDFP, negative for ck20, cdx2, TTF-1 and pax8, The immunostaining pattern supports the neoplasm is breast primary. The Breast prognostic profile has been ordered. Results: IMMUNOHISTOCHEMICAL AND MORPHOMETRIC ANALYSIS PERFORMED MANUALLY Estrogen Receptor: 70%, POSITIVE, MODERATE STAINING INTENSITY Progesterone Receptor: 0%, NEGATIVE Proliferation Marker Ki67: 30% Results: HER2 - **POSITIVE** RATIO OF HER2/CEP17 SIGNALS 2.91 AVERAGE HER2 COPY NUMBER PER CELL 7.28  GENETICS 05/07/17   PROCEDURES  ECHO 11/30/17 Impressions: - Normal LV size and systolic function, EF 46-27%. Strain as above.   Normal RV size and systolic function. No significant valvular   abnormalities.  ECHO 08/27/17  Impressions: - Normal LV size and systolic function, EF 03-50%. Strain as above.   Mildly dilated RV with normal systolic function.  ECHO 05/07/17 Impressions: - Normal LV size with mild LV hypertrophy. EF 65-70%, vigorous   systolic function. Strain as above. Normal RV size and systolic   function. No significant valvular abnormalities.  RADIOGRAPHIC STUDIES: I have personally reviewed the radiological images as listed and agreed with the findings in the report. No results found.  ASSESSMENT & PLAN:  67 y.o. 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 Malignant neoplasm of upper inner quadrant of right breast , Invasive ductal carcinoma, pT2N0M0, G2,  ER+/PR-/HER2+ -She underwent right breast  lumpectomy with sentinel lymph node biopsy with Dr. Excell Seltzer on 05/25/2017 with port placement -Pathology confirmed invasive ductal carcinoma, grade 2, spanning 2.2 cm, margins were clear, no positive lymph nodes, ER positive, PR negative, HER-2 positive. I reviewed this with the patient previously -We previously discussed the risk of cancer recurrence after completed surgical resection. Due to the HER-2 positive disease, she is at moderate to high risk  I recommended adjuvant chemotherapy at that time. The regiment was selected to cover for endometrial cancer also. -She completed adjuvant TCH every 3 weeks with Onpro from 07/01/17-10/14/17. I changed Taxol to abraxane with cycle 4 due to drug rash reaction. She tolerated relatively well besides the rash and Grade 1 neuropathy. -She is currently on Herceptin maintenance therapy began on 11/04/17. Plan for a total of 12 months. Will complete in early Oct 2019.  -She completed adjuvant radiation therapy on her breast on 11/01/17-12/08/17 with [name]. -She started Letrozole on 12/23/17. Will continue for 5-7 years. Given her new onset joint pain and stiffness I will switch her to exemestane. I discussed the possibility of a high co-pay. She is agreeable to try. I prescribed  today.  -Labs reviewed, Last TSH normal, WBC at 3.2, ANC at 1.3, CMP is still pending. Overall labs adequate to proceed with Herceptin today and every 3 weeks, she will complete on 06/23/2018  -Due for mammogram in 06/2018 -F/u in 6 weeks    2. 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 Denman George on 06/09/17 -We previously discussed her surgical pathology. she has high risk features including lymphovascular involvement, nodal positive disease, and mixed histology with endometrioid, clear cell, and serous carcinoma  -We previously  discussed the high risk of cancer recurrence after surgery extensively, and I strongly encouraged her to consider adjuvant chemotherapy to reduce her risk of recurrence. -She completed Taxol/carbo (taxol changed to abraxane) every 3 weeks x6 cycles on 10/14/17 and adjuvant vaginal brachytherapy from 08/18/17-09/17/17. She tolerated well overall. She experiences mild vaginal bleeding when using dilator as instructed per rad onc. -Her 12/30/17 CT scan showed 1.1 cm aorta-caval retroperitoneal lymph node which is borderline reactive. This was further evaluated by PET in 01/2018 which showed mild FDG activity, no other metastasis. -Her case was extensively discussed in GYN tumor board, especially with Drs. Cleda Clarks and Dr. Rhodia Albright at [facility]. The node is very difficult to biopsy, and the consensus is starting systemic therapy due to the high possibility of node recurrence.  -I discussed her endometrial cancer was tested for MSI and resulted MSI-high, which predicts good response to immunotherapy. She had genetic testing which was negative for Lynch syndrome.  -I previously recommended her to start immunotherapy Keytruda, instead of chemo, due to her MSI-H disease. Potential side effects were discussed. She voiced good understanding and agrees to proceed. -We previously discussed that the goal of therapy is palliative, for disease control, although there is small possibility immunotherapy may cure her recurrence.  -Advised that the patient proceed with an additional CT AP scan q 3-4 months while on immunotherapy. If there is no progression, then the patient will continue for up to 2 years on treatment.  -She is tolerating Keytruda very well, will continue today and every 3 weeks. -Plan to repeat a scan in 9 weeks, around 06/21/2018  3. Genetics -Given her personal history of breast and endometrial cancer, and a family history of malignancy.  -She was previously referred to genetics and results were negative   4.  HTN -We monitored her Hypertension while on chemo, she previously held HCTZ on days of chemo -F/u with PCP -BP normal lately  5. Transaminitis  -LFTs are chronically elevated due to fatty liver disease -We monitored her liver functions closely while on chemo, stable overall  -Will continue monitoring   6. Bone Health  -I previously discussed that aromatase inhibitors can potentially weaken the bone.  -She is on monthly boniva, prescribed by her PCP, since 02/2018.  -I strongly encouraged her to also take Vitamin D and Calcium supplements. -I will check her Vitamin D level every 6 months.  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 I previously discussed this can take 1-2 years to recover.  -She will continue B complex. I previously recommended Neurontin for her tingling and pain. I discussed side effects. She would like to hold off for now.  -I previously encouraged her to remain active with exercise.  -Her neuropathy is improving slowly.   8. Fatigue  -she has having mild fatigue, worse after [facility]  treatment, does recover well -She wishes to continue working 2 days a week no heavy lifting.  I will fill out her FMLA paperwork -She plans to retire next year.  Follow-up: -I prescribed exemestane today, she will stop letrozole due to musculoskeletal pain, switch to exemestane. -Labs reviewed and adequate to proceed with Keytruda and Herceptin today and every 3 weeks -Lab, flush, f/u and Keytruda in 6 weeks -F/u and Keytruda in 9 weeks with CT AP with contrast a few days before  -I filled out her FMLA papers today    Orders Placed This Encounter  Procedures  . CT Abdomen Pelvis W Contrast    Standing Status:   Future    Standing Expiration Date:   04/21/2019    Order Specific Question:   If indicated for the ordered procedure, I authorize the administration of contrast media per Radiology protocol    Answer:   Yes    Order Specific  Question:   Preferred imaging location?    Answer:   [facility]    Order Specific Question:   Is Oral Contrast requested for this exam?    Answer:   Yes, Per Radiology protocol    Order Specific Question:   Radiology Contrast Protocol - do NOT remove file path    Answer:   [email or web address]  . Vitamin D 25 hydroxy    Standing Status:   Standing    Number of Occurrences:   10    Standing Expiration Date:   04/22/2023   Pt and her husband had many questions. All questions were answered. The patient knows to call the clinic with any problems, questions or concerns.  I spent 25 minutes counseling the patient face to face. The total time spent in the appointment was 30 minutes and more than 50% was on counseling.   Truitt Merle  04/21/2018   Oneal Deputy, am acting as scribe for Truitt Merle, MD.   I have reviewed the above documentation for accuracy and completeness, and I agree with the above.

## 2018-04-20 NOTE — Telephone Encounter (Signed)
Faxed medical records to [employer] at [phone], Release ID: [id number]

## 2018-04-21 ENCOUNTER — Telehealth: Payer: Self-pay

## 2018-04-21 ENCOUNTER — Inpatient Hospital Stay: Payer: Medicare Other

## 2018-04-21 ENCOUNTER — Inpatient Hospital Stay: Payer: Medicare Other | Attending: Hematology

## 2018-04-21 ENCOUNTER — Encounter: Payer: Self-pay | Admitting: Hematology

## 2018-04-21 ENCOUNTER — Inpatient Hospital Stay (HOSPITAL_BASED_OUTPATIENT_CLINIC_OR_DEPARTMENT_OTHER): Payer: Medicare Other | Admitting: Hematology

## 2018-04-21 VITALS — BP 133/75 | HR 76 | Temp 98.0°F | Resp 17 | Ht 63.0 in | Wt 187.4 lb

## 2018-04-21 DIAGNOSIS — C50211 Malignant neoplasm of upper-inner quadrant of right female breast: Secondary | ICD-10-CM

## 2018-04-21 DIAGNOSIS — M810 Age-related osteoporosis without current pathological fracture: Secondary | ICD-10-CM

## 2018-04-21 DIAGNOSIS — Z170 Estrogen receptor positive status [ER+]: Secondary | ICD-10-CM | POA: Diagnosis not present

## 2018-04-21 DIAGNOSIS — R530 Neoplastic (malignant) related fatigue: Secondary | ICD-10-CM | POA: Diagnosis not present

## 2018-04-21 DIAGNOSIS — C541 Malignant neoplasm of endometrium: Secondary | ICD-10-CM

## 2018-04-21 DIAGNOSIS — Z79899 Other long term (current) drug therapy: Secondary | ICD-10-CM | POA: Diagnosis not present

## 2018-04-21 DIAGNOSIS — Z5112 Encounter for antineoplastic immunotherapy: Secondary | ICD-10-CM | POA: Diagnosis not present

## 2018-04-21 DIAGNOSIS — G620 Drug-induced polyneuropathy: Secondary | ICD-10-CM

## 2018-04-21 DIAGNOSIS — C775 Secondary and unspecified malignant neoplasm of intrapelvic lymph nodes: Secondary | ICD-10-CM

## 2018-04-21 DIAGNOSIS — Z95828 Presence of other vascular implants and grafts: Secondary | ICD-10-CM

## 2018-04-21 LAB — CBC WITH DIFFERENTIAL/PLATELET
Basophils Absolute: 0 10*3/uL (ref 0.0–0.1)
Basophils Relative: 1 %
Eosinophils Absolute: 0.1 10*3/uL (ref 0.0–0.5)
Eosinophils Relative: 2 %
HEMATOCRIT: 38.3 % (ref 34.8–46.6)
Hemoglobin: 13.3 g/dL (ref 11.6–15.9)
LYMPHS ABS: 1.4 10*3/uL (ref 0.9–3.3)
LYMPHS PCT: 43 %
MCH: 35.5 pg — ABNORMAL HIGH (ref 25.1–34.0)
MCHC: 34.8 g/dL (ref 31.5–36.0)
MCV: 102.1 fL — AB (ref 79.5–101.0)
MONO ABS: 0.4 10*3/uL (ref 0.1–0.9)
MONOS PCT: 12 %
NEUTROS ABS: 1.3 10*3/uL — AB (ref 1.5–6.5)
Neutrophils Relative %: 42 %
Platelets: 151 10*3/uL (ref 145–400)
RBC: 3.75 MIL/uL (ref 3.70–5.45)
RDW: 12.7 % (ref 11.2–14.5)
WBC: 3.2 10*3/uL — ABNORMAL LOW (ref 3.9–10.3)

## 2018-04-21 LAB — COMPREHENSIVE METABOLIC PANEL
ALBUMIN: 3.8 g/dL (ref 3.5–5.0)
ALK PHOS: 52 U/L (ref 38–126)
ALT: 143 U/L — ABNORMAL HIGH (ref 0–44)
AST: 85 U/L — ABNORMAL HIGH (ref 15–41)
Anion gap: 9 (ref 5–15)
BUN: 11 mg/dL (ref 8–23)
CO2: 27 mmol/L (ref 22–32)
Calcium: 9.2 mg/dL (ref 8.9–10.3)
Chloride: 100 mmol/L (ref 98–111)
Creatinine, Ser: 0.79 mg/dL (ref 0.44–1.00)
GFR calc Af Amer: >60 mL/min (ref >60)
GFR calc non Af Amer: >60 mL/min (ref >60)
GLUCOSE: 135 mg/dL — AB (ref 70–99)
POTASSIUM: 3.4 mmol/L — AB (ref 3.5–5.1)
SODIUM: 136 mmol/L (ref 135–145)
Total Bilirubin: 0.6 mg/dL (ref 0.3–1.2)
Total Protein: 7.2 g/dL (ref 6.5–8.1)

## 2018-04-21 MED ORDER — SODIUM CHLORIDE 0.9 % IV SOLN
Freq: Once | INTRAVENOUS | Status: AC
  Filled 2018-04-21: qty 250

## 2018-04-21 MED ORDER — ACETAMINOPHEN 325 MG PO TABS
650.0000 mg | ORAL_TABLET | Freq: Once | ORAL | Status: AC
  Administered 2018-04-21: 650 mg via ORAL

## 2018-04-21 MED ORDER — SODIUM CHLORIDE 0.9 % IV SOLN
200.0000 mg | Freq: Once | INTRAVENOUS | Status: AC
  Administered 2018-04-21: 200 mg via INTRAVENOUS
  Filled 2018-04-21: qty 8

## 2018-04-21 MED ORDER — HEPARIN SOD (PORK) LOCK FLUSH 100 UNIT/ML IV SOLN
500.0000 [IU] | Freq: Once | INTRAVENOUS | Status: AC | PRN
  Administered 2018-04-21: 500 [IU]
  Filled 2018-04-21: qty 5

## 2018-04-21 MED ORDER — TRASTUZUMAB CHEMO 150 MG IV SOLR
6.0000 mg/kg | Freq: Once | INTRAVENOUS | Status: AC
  Administered 2018-04-21: 525 mg via INTRAVENOUS
  Filled 2018-04-21: qty 25

## 2018-04-21 MED ORDER — EXEMESTANE 25 MG PO TABS: 25.0000 mg | ORAL_TABLET | Freq: Every day | ORAL | 3 refills | Status: DC

## 2018-04-21 MED ORDER — SODIUM CHLORIDE 0.9% FLUSH
10.0000 mL | INTRAVENOUS | Status: DC | PRN
  Administered 2018-04-21: 10 mL via INTRAVENOUS
  Filled 2018-04-21: qty 10

## 2018-04-21 MED ORDER — DIPHENHYDRAMINE HCL 25 MG PO CAPS
ORAL_CAPSULE | ORAL | Status: AC
  Filled 2018-04-21: qty 2

## 2018-04-21 MED ORDER — SODIUM CHLORIDE 0.9 % IV SOLN
Freq: Once | INTRAVENOUS | Status: AC
  Administered 2018-04-21: 12:00:00 via INTRAVENOUS
  Filled 2018-04-21: qty 250

## 2018-04-21 MED ORDER — SODIUM CHLORIDE 0.9% FLUSH
10.0000 mL | INTRAVENOUS | Status: DC | PRN
  Administered 2018-04-21: 10 mL
  Filled 2018-04-21: qty 10

## 2018-04-21 MED ORDER — ACETAMINOPHEN 325 MG PO TABS
ORAL_TABLET | ORAL | Status: AC
  Filled 2018-04-21: qty 2

## 2018-04-21 MED ORDER — DIPHENHYDRAMINE HCL 25 MG PO CAPS
50.0000 mg | ORAL_CAPSULE | Freq: Once | ORAL | Status: AC
  Administered 2018-04-21: 50 mg via ORAL

## 2018-04-21 MED FILL — EXEMESTANE 25 MG TABLET: 25 | 30 days supply | Qty: 30 | Fill #0

## 2018-04-21 NOTE — Progress Notes (Signed)
Ok to treat with ANC of 1.3, AST of 85 and ALT of 143 per [name]

## 2018-04-21 NOTE — Patient Instructions (Signed)
Implanted Port Home Guide An implanted port is a type of central line that is placed under the skin. Central lines are used to provide IV access when treatment or nutrition needs to be given through a person's veins. Implanted ports are used for long-term IV access. An implanted port may be placed because:  You need IV medicine that would be irritating to the small veins in your hands or arms.  You need long-term IV medicines, such as antibiotics.  You need IV nutrition for a long period.  You need frequent blood draws for lab tests.  You need dialysis.  Implanted ports are usually placed in the chest area, but they can also be placed in the upper arm, the abdomen, or the leg. An implanted port has two main parts:  Reservoir. The reservoir is round and will appear as a small, raised area under your skin. The reservoir is the part where a needle is inserted to give medicines or draw blood.  Catheter. The catheter is a thin, flexible tube that extends from the reservoir. The catheter is placed into a large vein. Medicine that is inserted into the reservoir goes into the catheter and then into the vein.  How will I care for my incision site? Do not get the incision site wet. Bathe or shower as directed by your health care provider. How is my port accessed? Special steps must be taken to access the port:  Before the port is accessed, a numbing cream can be placed on the skin. This helps numb the skin over the port site.  Your health care provider uses a sterile technique to access the port. ? Your health care provider must put on a mask and sterile gloves. ? The skin over your port is cleaned carefully with an antiseptic and allowed to dry. ? The port is gently pinched between sterile gloves, and a needle is inserted into the port.  Only "non-coring" port needles should be used to access the port. Once the port is accessed, a blood return should be checked. This helps ensure that the port  is in the vein and is not clogged.  If your port needs to remain accessed for a constant infusion, a clear (transparent) bandage will be placed over the needle site. The bandage and needle will need to be changed every week, or as directed by your health care provider.  Keep the bandage covering the needle clean and dry. Do not get it wet. Follow your health care provider's instructions on how to take a shower or bath while the port is accessed.  If your port does not need to stay accessed, no bandage is needed over the port.  What is flushing? Flushing helps keep the port from getting clogged. Follow your health care provider's instructions on how and when to flush the port. Ports are usually flushed with saline solution or a medicine called heparin. The need for flushing will depend on how the port is used.  If the port is used for intermittent medicines or blood draws, the port will need to be flushed: ? After medicines have been given. ? After blood has been drawn. ? As part of routine maintenance.  If a constant infusion is running, the port may not need to be flushed.  How long will my port stay implanted? The port can stay in for as long as your health care provider thinks it is needed. When it is time for the port to come out, surgery will be   done to remove it. The procedure is similar to the one performed when the port was put in. When should I seek immediate medical care? When you have an implanted port, you should seek immediate medical care if:  You notice a bad smell coming from the incision site.  You have swelling, redness, or drainage at the incision site.  You have more swelling or pain at the port site or the surrounding area.  You have a fever that is not controlled with medicine.  This information is not intended to replace advice given to you by your health care provider. Make sure you discuss any questions you have with your health care provider. Document  Released: 09/01/2005 Document Revised: 02/07/2016 Document Reviewed: 05/09/2013 Elsevier Interactive Patient Education  2017 [employer]  

## 2018-04-21 NOTE — Telephone Encounter (Signed)
Prinited avs and calender of upcoming appointment. Per 8/7 los.

## 2018-04-28 ENCOUNTER — Encounter: Payer: Self-pay | Admitting: Family Medicine

## 2018-04-28 ENCOUNTER — Encounter: Payer: Self-pay | Admitting: Hematology

## 2018-04-28 DIAGNOSIS — M810 Age-related osteoporosis without current pathological fracture: Secondary | ICD-10-CM | POA: Diagnosis not present

## 2018-04-28 DIAGNOSIS — R921 Mammographic calcification found on diagnostic imaging of breast: Secondary | ICD-10-CM | POA: Diagnosis not present

## 2018-04-28 DIAGNOSIS — Z853 Personal history of malignant neoplasm of breast: Secondary | ICD-10-CM | POA: Diagnosis not present

## 2018-04-28 LAB — HM DEXA SCAN

## 2018-04-29 ENCOUNTER — Telehealth: Payer: Self-pay

## 2018-04-29 NOTE — Telephone Encounter (Signed)
Patient calls stating the exemestane is too expensive costing her over $90 monthly out of pocket.  Wants to know if there is a cheaper medication?  [phone]

## 2018-04-29 NOTE — Telephone Encounter (Signed)
Returned patient's call regarding exemestane being too expensive.  She has enough to last until she comes in on 8/28.  Per [name] explained we can switch her to anastrozole or letrozole but she does not want letrozole because it caused too much bone pain.  She is going to check with the pharmacy to see how much anastrozole is and will let us know on 8/28

## 2018-05-03 ENCOUNTER — Telehealth: Payer: Self-pay | Admitting: *Deleted

## 2018-05-03 NOTE — Telephone Encounter (Signed)
Received results of bone density done  04/28/18 at [facility].  Gave results to [name] for review.

## 2018-05-05 MED FILL — OLMESARTAN-HCTZ 20-12.5 MG: 20-12.5 | 30 days supply | Qty: 30 | Fill #0

## 2018-05-10 ENCOUNTER — Other Ambulatory Visit: Payer: Self-pay | Admitting: Hematology

## 2018-05-12 ENCOUNTER — Inpatient Hospital Stay: Payer: Medicare Other

## 2018-05-12 ENCOUNTER — Other Ambulatory Visit: Payer: Self-pay | Admitting: Nurse Practitioner

## 2018-05-12 VITALS — BP 137/80 | HR 75 | Temp 98.1°F | Resp 18 | Wt 187.5 lb

## 2018-05-12 DIAGNOSIS — C541 Malignant neoplasm of endometrium: Secondary | ICD-10-CM

## 2018-05-12 DIAGNOSIS — C50211 Malignant neoplasm of upper-inner quadrant of right female breast: Secondary | ICD-10-CM

## 2018-05-12 DIAGNOSIS — M810 Age-related osteoporosis without current pathological fracture: Secondary | ICD-10-CM

## 2018-05-12 DIAGNOSIS — Z170 Estrogen receptor positive status [ER+]: Secondary | ICD-10-CM

## 2018-05-12 DIAGNOSIS — Z95828 Presence of other vascular implants and grafts: Secondary | ICD-10-CM

## 2018-05-12 DIAGNOSIS — Z5112 Encounter for antineoplastic immunotherapy: Secondary | ICD-10-CM | POA: Diagnosis not present

## 2018-05-12 DIAGNOSIS — R530 Neoplastic (malignant) related fatigue: Secondary | ICD-10-CM | POA: Diagnosis not present

## 2018-05-12 DIAGNOSIS — C775 Secondary and unspecified malignant neoplasm of intrapelvic lymph nodes: Secondary | ICD-10-CM | POA: Diagnosis not present

## 2018-05-12 DIAGNOSIS — G620 Drug-induced polyneuropathy: Secondary | ICD-10-CM | POA: Diagnosis not present

## 2018-05-12 LAB — CBC WITH DIFFERENTIAL/PLATELET
Basophils Absolute: 0 10*3/uL (ref 0.0–0.1)
Basophils Relative: 1 %
EOS ABS: 0.1 10*3/uL (ref 0.0–0.5)
Eosinophils Relative: 2 %
HEMATOCRIT: 37.8 % (ref 34.8–46.6)
Hemoglobin: 13.2 g/dL (ref 11.6–15.9)
Lymphocytes Relative: 36 %
Lymphs Abs: 1.3 10*3/uL (ref 0.9–3.3)
MCH: 35.4 pg — AB (ref 25.1–34.0)
MCHC: 34.9 g/dL (ref 31.5–36.0)
MCV: 101.6 fL — AB (ref 79.5–101.0)
Monocytes Absolute: 0.4 10*3/uL (ref 0.1–0.9)
Monocytes Relative: 11 %
NEUTROS PCT: 50 %
Neutro Abs: 1.8 10*3/uL (ref 1.5–6.5)
PLATELETS: 139 10*3/uL — AB (ref 145–400)
RBC: 3.72 MIL/uL (ref 3.70–5.45)
RDW: 12.4 % (ref 11.2–14.5)
WBC: 3.6 10*3/uL — ABNORMAL LOW (ref 3.9–10.3)

## 2018-05-12 LAB — COMPREHENSIVE METABOLIC PANEL
ALT: 143 U/L — AB (ref 0–44)
AST: 89 U/L — ABNORMAL HIGH (ref 15–41)
Albumin: 3.7 g/dL (ref 3.5–5.0)
Alkaline Phosphatase: 48 U/L (ref 38–126)
Anion gap: 7 (ref 5–15)
BUN: 9 mg/dL (ref 8–23)
CO2: 29 mmol/L (ref 22–32)
CREATININE: 0.82 mg/dL (ref 0.44–1.00)
Calcium: 9.1 mg/dL (ref 8.9–10.3)
Chloride: 99 mmol/L (ref 98–111)
GFR calc non Af Amer: >60 mL/min (ref >60)
Glucose, Bld: 165 mg/dL — ABNORMAL HIGH (ref 70–99)
Potassium: 3.3 mmol/L — ABNORMAL LOW (ref 3.5–5.1)
Sodium: 135 mmol/L (ref 135–145)
Total Bilirubin: 0.5 mg/dL (ref 0.3–1.2)
Total Protein: 7 g/dL (ref 6.5–8.1)

## 2018-05-12 MED ORDER — DIPHENHYDRAMINE HCL 25 MG PO CAPS
ORAL_CAPSULE | ORAL | Status: AC
  Filled 2018-05-12: qty 2

## 2018-05-12 MED ORDER — ACETAMINOPHEN 325 MG PO TABS
ORAL_TABLET | ORAL | Status: AC
  Filled 2018-05-12: qty 2

## 2018-05-12 MED ORDER — TRASTUZUMAB CHEMO 150 MG IV SOLR
6.0000 mg/kg | Freq: Once | INTRAVENOUS | Status: AC
  Administered 2018-05-12: 525 mg via INTRAVENOUS
  Filled 2018-05-12: qty 25

## 2018-05-12 MED ORDER — SODIUM CHLORIDE 0.9% FLUSH
10.0000 mL | INTRAVENOUS | Status: DC | PRN
  Administered 2018-05-12: 10 mL via INTRAVENOUS
  Filled 2018-05-12: qty 10

## 2018-05-12 MED ORDER — SODIUM CHLORIDE 0.9% FLUSH
10.0000 mL | INTRAVENOUS | Status: DC | PRN
  Administered 2018-05-12: 10 mL
  Filled 2018-05-12: qty 10

## 2018-05-12 MED ORDER — SODIUM CHLORIDE 0.9 % IV SOLN
Freq: Once | INTRAVENOUS | Status: AC
  Administered 2018-05-12: 09:00:00 via INTRAVENOUS
  Filled 2018-05-12: qty 250

## 2018-05-12 MED ORDER — SODIUM CHLORIDE 0.9 % IV SOLN
200.0000 mg | Freq: Once | INTRAVENOUS | Status: AC
  Administered 2018-05-12: 200 mg via INTRAVENOUS
  Filled 2018-05-12: qty 8

## 2018-05-12 MED ORDER — ACETAMINOPHEN 325 MG PO TABS
650.0000 mg | ORAL_TABLET | Freq: Once | ORAL | Status: AC
  Administered 2018-05-12: 650 mg via ORAL

## 2018-05-12 MED ORDER — ANASTROZOLE 1 MG PO TABS: 1.0000 mg | ORAL_TABLET | Freq: Every day | ORAL | 3 refills | Status: DC

## 2018-05-12 MED ORDER — DIPHENHYDRAMINE HCL 25 MG PO CAPS
50.0000 mg | ORAL_CAPSULE | Freq: Once | ORAL | Status: AC
  Administered 2018-05-12: 50 mg via ORAL

## 2018-05-12 MED ORDER — HEPARIN SOD (PORK) LOCK FLUSH 100 UNIT/ML IV SOLN
500.0000 [IU] | Freq: Once | INTRAVENOUS | Status: AC | PRN
  Administered 2018-05-12: 500 [IU]
  Filled 2018-05-12: qty 5

## 2018-05-12 MED FILL — ANASTROZOLE 1 MG TABLET: 1 | 30 days supply | Qty: 30 | Fill #0

## 2018-05-12 NOTE — Patient Instructions (Signed)
[facility] Discharge Instructions for Patients Receiving Chemotherapy  Today you received the following chemotherapy agents Herceptin, Keytruda  To help prevent nausea and vomiting after your treatment, we encourage you to take your nausea medication as directed   If you develop nausea and vomiting that is not controlled by your nausea medication, call the clinic.   BELOW ARE SYMPTOMS THAT SHOULD BE REPORTED IMMEDIATELY:  *FEVER GREATER THAN 100.5 F  *CHILLS WITH OR WITHOUT FEVER  NAUSEA AND VOMITING THAT IS NOT CONTROLLED WITH YOUR NAUSEA MEDICATION  *UNUSUAL SHORTNESS OF BREATH  *UNUSUAL BRUISING OR BLEEDING  TENDERNESS IN MOUTH AND THROAT WITH OR WITHOUT PRESENCE OF ULCERS  *URINARY PROBLEMS  *BOWEL PROBLEMS  UNUSUAL RASH Items with * indicate a potential emergency and should be followed up as soon as possible.  Feel free to call the clinic should you have any questions or concerns. The clinic phone number is [phone].  Please show the [place] at check-in to the Emergency Department and triage nurse.

## 2018-05-12 NOTE — Progress Notes (Signed)
Ok to treat with 05/12/18 CMP per Garry Heater, NP.

## 2018-05-13 LAB — VITAMIN D 25 HYDROXY (VIT D DEFICIENCY, FRACTURES): VIT D 25 HYDROXY: 31.9 ng/mL (ref 30.0–100.0)

## 2018-05-20 ENCOUNTER — Encounter: Payer: Self-pay | Admitting: Family Medicine

## 2018-05-24 ENCOUNTER — Ambulatory Visit: Payer: Medicare Other | Admitting: Family Medicine

## 2018-05-31 NOTE — Progress Notes (Signed)
[place]  [phone] [phone]  Clinic Follow up Note   Patient Care Team: [name], MD as PCP - Haskell Riling, MD as Consulting Physician (General Surgery) Truitt Merle, MD as Consulting Physician (Hematology) [name], MD as Attending Physician (Radiation Oncology) 06/02/2018  SUMMARY OF ONCOLOGIC HISTORY: Oncology History   MSI high     Malignant neoplasm of upper-inner quadrant of right breast in female, estrogen receptor positive ([place])   04/27/2017 Initial Biopsy    Diagnosis Breast, right, needle core biopsy INVASIVE DUCTAL CARCINOMA, GRADE 2 Microscopic Comment The neoplasm has intracellular mucin and signet ring cell features. Immunostains shows these cells are positive for ER,GATA3, ck7 and GCDFP, negative for ck20, cdx2, TTF-1 and pax8, The immunostaining pattern supports the neoplasm is breast primary.    04/27/2017 Receptors her2    Estrogen Receptor: 70%, POSITIVE, MODERATE STAINING INTENSITY Progesterone Receptor: 0%, NEGATIVE Proliferation Marker Ki67: 30% HER2 - **POSITIVE** RATIO OF HER2/CEP17 SIGNALS 2.91 AVERAGE HER2 COPY NUMBER PER CELL 7.28    04/27/2017 Initial Diagnosis    Malignant neoplasm of upper-inner quadrant of right breast in female, estrogen receptor positive ([place])    05/07/2017 Imaging    CT Chest W Contrast 05/07/17 IMPRESSION: Tiny well-defined fatty lesion on the pleura at the right lung base. The thinner slice collimation used for today's chest CT eliminates volume-averaging seen in the lesion on the prior exam and confirms that this is a diffusely fatty nodule. This is a benign finding and likely represents a tiny lipoma. No defect in the hemidiaphragm evident to suggest tiny diaphragmatic hernia. Pulmonary hamartoma a consideration although the lack of soft tissue components makes this less likely.    05/15/2017 Genetic Testing    Patient had genetic testing due to  a personal history of breast cancer and uterine cancer as well as a family history of cancer. The Multi-Cancer panel was ordered. The Multi-Cancer Panel offered by [facility] includes sequencing and/or deletion duplication testing of the following 83 genes: ALK, APC, ATM, AXIN2,BAP1,  BARD1, BLM, BMPR1A, BRCA1, BRCA2, BRIP1, CASR, CDC73, CDH1, CDK4, CDKN1B, CDKN1C, CDKN2A (p14ARF), CDKN2A (p16INK4a), CEBPA, CHEK2, CTNNA1, DICER1, DIS3L2, EGFR (c.2369C>T, p.Thr790Met variant only), EPCAM (Deletion/duplication testing only), FH, FLCN, GATA2, GPC3, GREM1 (Promoter region deletion/duplication testing only), HOXB13 (c.251G>A, p.Gly84Glu), HRAS, KIT, MAX, MEN1, MET, MITF (c.952G>A, p.Glu318Lys variant only), MLH1, MSH2, MSH3, MSH6, MUTYH, NBN, NF1, NF2, NTHL1, PALB2, PDGFRA, PHOX2B, PMS2, POLD1, POLE, POT1, PRKAR1A, PTCH1, PTEN, RAD50, RAD51C, RAD51D, RB1, RECQL4, RET, RUNX1, SDHAF2, SDHA (sequence changes only), SDHB, SDHC, SDHD, SMAD4, SMARCA4, SMARCB1, SMARCE1, STK11, SUFU, TERC, TERT, TMEM127, TP53, TSC1, TSC2, VHL, WRN and WT1.   Results: No pathogenic mutations were identified. A VUS in the GATA2 gene c.1348G>A (p.Gly450Arg) was identified.  The date of this test report is 05/25/2017.      05/25/2017 Surgery    RIGHT BREAST LUMPECTOMY WITH RADIOACTIVE SEED AND  RIGHT AXILLARY SENTINEL LYMPH NODE BIOPSY and Pot placement by Dr. Excell Seltzer 05/25/17    05/25/2017 Pathology Results    Diagnosis 05/25/17 1. Breast, lumpectomy, right - INVASIVE DUCTAL CARCINOMA, GRADE II/III, SPANNING 2.2 CM. - DUCTAL CARCINOMA IN SITU, HIGH GRADE. - THE SURGICAL RESECTION MARGINS ARE NEGATIVE FOR CARCINOMA. - SEE ONCOLOGY TABLE BELOW. 2. Breast, excision, right additional superior margin - BENIGN FIBROADIPOSE TISSUE. - BENIGN SKELETAL MUSCLE. - SEE COMMENT. 3. Breast, excision, right additional lateral margin - BENIGN BREAST PARENCHYMA. - THERE IS NO EVIDENCE OF MALIGNANCY. - SEE COMMENT. 4. Breast, excision, chest  wall  margin - BENIGN FIBROADIPOSE TISSUE. - THERE IS NO EVIDENCE OF MALIGNANCY. - SEE COMMENT. 5. Lymph node, sentinel, biopsy, right axillary - THERE IS NO EVIDENCE OF CARCINOMA IN 1 OF 1 LYMPH NODE (0/1). 6. Lymph node, sentinel, biopsy, right axillary - THERE IS NO EVIDENCE OF CARCINOMA IN 1 OF 1 LYMPH NODE (0/1). 7. Lymph node, sentinel, biopsy, right axillary - THERE IS NO EVIDENCE OF CARCINOMA IN 1 OF 1 LYMPH NODE (0/1).    06/26/2017 PET scan    PET  IMPRESSION: 1. Postoperative findings both in the right breast and in the anatomic pelvis, with associated low-grade activity considered to be postoperative in nature. No hypermetabolic adenopathy or hypermetabolic lesions are identified to suggest active metastatic disease/malignancy. 2. Other imaging findings of potential clinical significance: Aortic Atherosclerosis (ICD10-I70.0). Sigmoid colon diverticulosis. Biapical pleuroparenchymal scarring in the lungs. Small amount of free pelvic fluid in the cul-de-sac, likely postoperative.     07/01/2017 - 10/14/2017 Chemotherapy    Adjuvant TCH with Onpro every 3 weeks for 6 cycles starting on 07/01/17, Changed Taxol to abraxane with cycle 4 due to drug rash reaction, followed by Herceptin every 3 weeks for 6 months     11/01/2017 - 12/08/2017 Radiation Therapy    Radiation therapy to her right breast with [name]    11/04/2017 -  Chemotherapy    Maintenance Herceptin starting 11/04/17 and will comeplete her 12 months in 06/2018     12/23/2017 -  Anti-estrogen oral therapy    Adjuvant Letrozole 2.5 mg daily     12/30/2017 Imaging    CT CAP W Contrast 12/30/17 IMPRESSION: Increased size of 1.1 cm aorto-caval retroperitoneal lymph node. This could be reactive due to interval hysterectomy, however metastatic carcinoma cannot be excluded. Consider continued attention on short-term follow-up CT, or PET-CT scan for further evaluation.  Mild hepatic steatosis.    01/25/2018 PET scan     IMPRESSION: 1. Enlarging hypermetabolic aortocaval lymph node is worrisome for metastatic disease. 2. Probable postoperative seroma in the medial right breast, with associated mild inflammatory hypermetabolism.     Endometrial adenocarcinoma ([place])   05/07/2017 Initial Diagnosis    Endometrial adenocarcinoma ([place])    06/09/2017 Surgery    XI ROBOTIC ASSISTED TOTAL LAPOROSCOPIC HYSTERECTOMY WITH BILATERAL SALPINGO OOPHORECTOMY and SENTINEL NODE BIOPSY by Dr. Denman George 06/09/17    06/09/2017 Pathology Results    Diagnosis 06/09/17 1. Lymph node, sentinel, biopsy, right external iliac - METASTATIC ADENOCARCINOMA IN ONE LYMPH NODE (1/1). 2. Lymph node, sentinel, biopsy, left obturator - ONE BENIGN LYMPH NODE (0/1). 3. Lymph node, sentinel, biopsy, left external iliac - METASTATIC ADENOCARCINOMA IN ONE LYMPH NODE (1/1). 4. Uterus +/- tubes/ovaries, neoplastic ENDOMETRIUM: - ENDOMETRIAL ADENOCARCINOMA, 3.2 CM. - CARCINOMA INVADES INNER HALF OF MYOMETRIUM. 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 - CERVIX, BILATERAL FALLOPIAN TUBES AND BILATERAL OVARIES FREE OF TUMOR    08/18/2017 - 09/17/2017 Radiation Therapy    vaginal brachytherapy per 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     Endometrial cancer ([place])   06/09/2017 Initial Diagnosis    Endometrial cancer ([place])    02/16/2018 -  Chemotherapy    The patient had pembrolizumab (KEYTRUDA) 200 mg in sodium chloride 0.9 % 50 mL chemo infusion, 200 mg, Intravenous, Once, 6 of 6 cycles Administration: 200 mg (02/17/2018), 200 mg (03/10/2018), 200 mg (03/31/2018), 200 mg (  04/21/2018), 200 mg (06/02/2018), 200 mg (05/12/2018)  for chemotherapy treatment.     CURRENT THERAPY:  -Maintenance Herceptin starting 11/04/17 and will complete 12 months in 06/2018 -Adjuvant letrozole 2.5 mg daily started 12/23/17, switched to exemestane on  04/21/18 due to joint pain, could not afford aromasin, started anastrozole 04/2018.  -Keytruda every 21 days started on 02/17/2018 for recurrent endometrial cancer  INTERVAL HISTORY: Ms. Jackowski returns for follow up as scheduled. She received last cycle herceptin and keytruda on 8/28. She tolerates treatment well. She could not afford aromasin so she began anastrozole, has been on nearly 1 month. She has not noticed much improvement in her morning stiffness. She avoids medication if she can. She remains active and functional. Denies hot flashes. Neuropathy is improving, some residual tingling in her feet at night. Had recent mammogram and DEXA, continues on boniva per PCP. She has f/u there on Monday. Otherwise she is doing well, denies fever, chills, cough, chest pain, dyspnea, n/v/c/d, vaginal bleeding, leg edema, or changes in her breast.    MEDICAL HISTORY:  Past Medical History:  Diagnosis Date  . Anemia    as a child.  . Arthritis   . Bilateral cataracts   . Bilateral leg cramps   . Cancer ([place])    skin  . Dyspnea   . Family history of bladder cancer   . Family history of colon cancer in father   . Fuchs' corneal dystrophy   . GERD (gastroesophageal reflux disease)   . Hearing loss    Right side 30%  . History of hiatal hernia    small size  . History of radiation therapy 08/21/17, 08/28/17,09/01/17, 09/11/17, 09/17/17   Vaginal cuff brachytherapy.   Marland Kitchen History of radiation therapy 11/11/17- 12/08/17   Right Breast treated to 40.05 Gy with 15 fx of 2.67 Gy and a boost of 10 Gy with 5 fx.   . Hyperlipidemia   . Hypertension   . IBS (irritable bowel syndrome)    hx of  . Malignant neoplasm of upper-inner quadrant of right female breast ([place]) 04/2017  . NAFL (nonalcoholic fatty liver)   . Obesity   . PONV (postoperative nausea and vomiting)   . 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 Uterine cancer ([place]) 04/2017   endometrial cancer  . Varices, gastric    . Vertigo     SURGICAL HISTORY: Past Surgical History:  Procedure Laterality Date  . BREAST BIOPSY Right 04/27/2017  . BREAST LUMPECTOMY WITH RADIOACTIVE SEED AND SENTINEL LYMPH NODE BIOPSY Right 05/25/2017   Procedure: RIGHT BREAST LUMPECTOMY WITH RADIOACTIVE SEED AND  RIGHT AXILLARY SENTINEL LYMPH NODE BIOPSY;  Surgeon: Excell Seltzer, MD;  Location: [facility];  Service: General;  Laterality: Right;  . CATARACT EXTRACTION, BILATERAL    . COLONOSCOPY    . HYSTEROSCOPY W/D&C N/A 04/29/2017   Procedure: DILATATION AND CURETTAGE /HYSTEROSCOPY;  Surgeon: Linda Hedges, DO;  Location: [facility];  Service: Gynecology;  Laterality: N/A;  . PORTACATH PLACEMENT Left 05/25/2017   Procedure: INSERTION PORT-A-CATH;  Surgeon: Excell Seltzer, MD;  Location: [facility];  Service: General;  Laterality: Left;  . ROBOTIC ASSISTED TOTAL HYSTERECTOMY WITH BILATERAL SALPINGO OOPHERECTOMY N/A 06/09/2017   Procedure: XI ROBOTIC ASSISTED TOTAL LAPOROSCOPIC HYSTERECTOMY WITH BILATERAL SALPINGO OOPHORECTOMY;  Surgeon: Everitt Amber, MD;  Location: [facility];  Service: Gynecology;  Laterality: N/A;  . SENTINEL NODE BIOPSY N/A 06/09/2017   Procedure: SENTINEL NODE BIOPSY;  Surgeon: Denman George,  Terrence Dupont, MD;  Location: [facility];  Service: Gynecology;  Laterality: N/A;    I have reviewed the social history and family history with the patient and they are unchanged from previous note.  ALLERGIES:  is allergic to ciprofloxacin; crestor [rosuvastatin calcium]; fosamax [alendronate sodium]; penicillins; and taxol [paclitaxel].  MEDICATIONS:  Current Outpatient Medications  Medication Sig Dispense Refill  . acetaminophen (TYLENOL) 325 MG tablet Take 650 mg by mouth every 6 (six) hours as needed for mild pain or moderate pain.    Marland Kitchen anastrozole (ARIMIDEX) 1 MG tablet Take 1 tablet (1 mg total) by mouth daily. 30 tablet 3  . Ascorbic Acid (VITAMIN C) 1000 MG tablet Take 1,000 mg by mouth daily.    Marland Kitchen b  complex vitamins capsule Take 1 capsule by mouth daily.    Marland Kitchen ibandronate (BONIVA) 150 MG tablet Take in the morning, once a month,  with a full glass of water,on an empty stomach,do not take anything else by mouth or lie down for the next 30 min. 3 tablet 3  . lidocaine-prilocaine (EMLA) cream   2  . meclizine (ANTIVERT) 25 MG tablet Take 1-2 tablets (25-50 mg total) by mouth 3 (three) times daily as needed for dizziness. (Patient taking differently: Take 25-50 mg by mouth 3 (three) times daily as needed for dizziness (depends on dizziness if takes 1-2 tablets). ) 30 tablet 0  . Multiple Vitamin (MULTIVITAMIN) capsule Take 1 capsule by mouth daily.      Marland Kitchen olmesartan-hydrochlorothiazide (BENICAR HCT) 20-12.5 MG tablet Take 1 tablet by mouth daily. 90 tablet 2  . Pitavastatin Calcium (LIVALO) 2 MG TABS Take 1.5 tablets (3 mg total) by mouth every evening. Takes 1.5 tablet 135 tablet 3  . sodium chloride (MURO 128) 5 % ophthalmic ointment Place 1 application into both eyes 2 (two) times daily.      No current facility-administered medications for this visit.    Facility-Administered Medications Ordered in Other Visits  Medication Dose Route Frequency Provider Last Rate Last Dose  . sodium chloride flush (NS) 0.9 % injection 10 mL  10 mL Intracatheter PRN Truitt Merle, MD   10 mL at 06/02/18 1634    PHYSICAL EXAMINATION: ECOG PERFORMANCE STATUS: 1 - Symptomatic but completely ambulatory  Vitals:   06/02/18 1308  BP: (!) 142/78  Pulse: 80  Resp: 18  Temp: 97.8 F (36.6 C)  SpO2: 99%   Filed Weights   06/02/18 1308  Weight: 188 lb 1.6 oz (85.3 kg)    GENERAL:alert, no distress and comfortable SKIN: skin color, texture, turgor are normal, few scattered erythematous areas to arms and chest bilaterally EYES: sclera clear OROPHARYNX:no thrush or ulcers  LYMPH:  no palpable cervical or supraclavicular lymphadenopathy  LUNGS: clear to auscultation with normal breathing effort HEART: regular  rate & rhythm and no murmurs and no lower extremity edema ABDOMEN:abdomen soft, non-tender and normal bowel sounds Musculoskeletal:no cyanosis of digits and no clubbing  NEURO: alert & oriented x 3 with fluent speech, no focal motor/sensory deficits Breast exam: s/p right lumpectomy and radiation, incisions have healed well, no hyperpigmentation of skin. No palpbale mass in either breast or axilla that I could appreciate PAC without erythema   LABORATORY DATA:  I have reviewed the data as listed CBC Latest Ref Rng & Units 06/02/2018 05/12/2018 04/21/2018  WBC 3.9 - 10.3 K/uL 4.1 3.6(L) 3.2(L)  Hemoglobin 11.6 - 15.9 g/dL 13.4 13.2 13.3  Hematocrit 34.8 - 46.6 % 38.3 37.8 38.3  Platelets 145 -  400 K/uL 147 139(L) 151     CMP Latest Ref Rng & Units 06/02/2018 05/12/2018 04/21/2018  Glucose 70 - 99 mg/dL 130(H) 165(H) 135(H)  BUN 8 - 23 mg/dL '11 9 11  '$ Creatinine 0.44 - 1.00 mg/dL 0.83 0.82 0.79  Sodium 135 - 145 mmol/L 138 135 136  Potassium 3.5 - 5.1 mmol/L 3.4(L) 3.3(L) 3.4(L)  Chloride 98 - 111 mmol/L 102 99 100  CO2 22 - 32 mmol/L '28 29 27  '$ Calcium 8.9 - 10.3 mg/dL 9.4 9.1 9.2  Total Protein 6.5 - 8.1 g/dL 7.3 7.0 7.2  Total Bilirubin 0.3 - 1.2 mg/dL 0.6 0.5 0.6  Alkaline Phos 38 - 126 U/L 49 48 52  AST 15 - 41 U/L 62(H) 89(H) 85(H)  ALT 0 - 44 U/L 108(H) 143(H) 143(H)      RADIOGRAPHIC STUDIES: I have personally reviewed the radiological images as listed and agreed with the findings in the report. No results found.   ASSESSMENT & PLAN: 67 y.o. 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 Malignant neoplasm of upper inner quadrant of right breast , Invasive ductal carcinoma, pT2N0M0, G2,  ER+/PR-/HER2+ -Ms. Tilson appears stable. She continues herceptin q3 weeks, she tolerates treatment well overall. She will complete 1 year of maintenance herceptin therapy next month  -Her next echo is 9/26 -she had  joint stiffness on letrozole and could not afford aromasin, she is on anastrazole and continues to have stiffness that is tolerable. She avoids medication. I encouraged her to be active and can take tylenol PRN. -f/u in 3 weeks with next cycle  -04/2018 mammogram showed calcifications in right breast, a 6 months mammogram was recommended in 10/2018   2. Endometrial adenocarcinoma with lymph node metastasis, pT1apN1M0, FIGO stage IIIc, MSI-H, probable RP node recurrence in 12/2017  -she tolerates q3 week [place] well overall, will proceed with next dose today -plan to repeat CT AP in 3 weeks before next cycle   3. Genetics -She was previously referred to genetics and results were negative   4. HTN -On Benicar   5. Transaminitis  -chronic, slightly improved today   6. Bone Health  -Her 04/2018 DEXA improved from 2016 I reviewed it with her today; she is now considered osteopenic -She continues boniva (per PCP), calcium, and vitamin D -Repeat in 2 years   7. Neuropathy G1 -improving overall  8. Fatigue  -remains fully functional and active  PLAN: -Labs reviewed, proceed with herceptin and keydruda today, q3 weeks -Last herceptin in 3 weeks  -Continue anastrozole  -CT AP in 3 weeks  -Next echo 9/26 with Dr. Aundra Dubin -F/u in 3 weeks with scans and next cycle   All questions were answered. The patient knows to call the clinic with any problems, questions or concerns. No barriers to learning was detected. I spent 20 minutes counseling the patient face to face. The total time spent in the appointment was 25 minutes and more than 50% was on counseling and review of test results     Alla Feeling, NP 06/02/18

## 2018-06-02 ENCOUNTER — Inpatient Hospital Stay: Payer: Medicare Other

## 2018-06-02 ENCOUNTER — Telehealth: Payer: Self-pay | Admitting: Nurse Practitioner

## 2018-06-02 ENCOUNTER — Inpatient Hospital Stay: Payer: Medicare Other | Attending: Hematology

## 2018-06-02 ENCOUNTER — Inpatient Hospital Stay (HOSPITAL_BASED_OUTPATIENT_CLINIC_OR_DEPARTMENT_OTHER): Payer: Medicare Other | Admitting: Nurse Practitioner

## 2018-06-02 ENCOUNTER — Encounter: Payer: Self-pay | Admitting: Nurse Practitioner

## 2018-06-02 VITALS — BP 142/78 | HR 80 | Temp 97.8°F | Resp 18 | Ht 63.0 in | Wt 188.1 lb

## 2018-06-02 DIAGNOSIS — C541 Malignant neoplasm of endometrium: Secondary | ICD-10-CM

## 2018-06-02 DIAGNOSIS — Z23 Encounter for immunization: Secondary | ICD-10-CM

## 2018-06-02 DIAGNOSIS — I10 Essential (primary) hypertension: Secondary | ICD-10-CM | POA: Insufficient documentation

## 2018-06-02 DIAGNOSIS — Z79811 Long term (current) use of aromatase inhibitors: Secondary | ICD-10-CM | POA: Diagnosis not present

## 2018-06-02 DIAGNOSIS — Z170 Estrogen receptor positive status [ER+]: Secondary | ICD-10-CM

## 2018-06-02 DIAGNOSIS — Z9221 Personal history of antineoplastic chemotherapy: Secondary | ICD-10-CM | POA: Diagnosis not present

## 2018-06-02 DIAGNOSIS — Z79899 Other long term (current) drug therapy: Secondary | ICD-10-CM

## 2018-06-02 DIAGNOSIS — C50211 Malignant neoplasm of upper-inner quadrant of right female breast: Secondary | ICD-10-CM

## 2018-06-02 DIAGNOSIS — C775 Secondary and unspecified malignant neoplasm of intrapelvic lymph nodes: Secondary | ICD-10-CM | POA: Diagnosis not present

## 2018-06-02 DIAGNOSIS — C779 Secondary and unspecified malignant neoplasm of lymph node, unspecified: Secondary | ICD-10-CM | POA: Diagnosis not present

## 2018-06-02 DIAGNOSIS — Z5112 Encounter for antineoplastic immunotherapy: Secondary | ICD-10-CM | POA: Diagnosis not present

## 2018-06-02 DIAGNOSIS — Z923 Personal history of irradiation: Secondary | ICD-10-CM | POA: Diagnosis not present

## 2018-06-02 DIAGNOSIS — Z95828 Presence of other vascular implants and grafts: Secondary | ICD-10-CM

## 2018-06-02 DIAGNOSIS — E669 Obesity, unspecified: Secondary | ICD-10-CM | POA: Diagnosis not present

## 2018-06-02 LAB — COMPREHENSIVE METABOLIC PANEL
ALT: 108 U/L — AB (ref 0–44)
AST: 62 U/L — AB (ref 15–41)
Albumin: 3.8 g/dL (ref 3.5–5.0)
Alkaline Phosphatase: 49 U/L (ref 38–126)
Anion gap: 8 (ref 5–15)
BUN: 11 mg/dL (ref 8–23)
CHLORIDE: 102 mmol/L (ref 98–111)
CO2: 28 mmol/L (ref 22–32)
CREATININE: 0.83 mg/dL (ref 0.44–1.00)
Calcium: 9.4 mg/dL (ref 8.9–10.3)
Glucose, Bld: 130 mg/dL — ABNORMAL HIGH (ref 70–99)
POTASSIUM: 3.4 mmol/L — AB (ref 3.5–5.1)
SODIUM: 138 mmol/L (ref 135–145)
Total Bilirubin: 0.6 mg/dL (ref 0.3–1.2)
Total Protein: 7.3 g/dL (ref 6.5–8.1)

## 2018-06-02 LAB — CBC WITH DIFFERENTIAL/PLATELET
Basophils Absolute: 0 10*3/uL (ref 0.0–0.1)
Basophils Relative: 1 %
EOS ABS: 0.1 10*3/uL (ref 0.0–0.5)
Eosinophils Relative: 3 %
HCT: 38.3 % (ref 34.8–46.6)
HEMOGLOBIN: 13.4 g/dL (ref 11.6–15.9)
LYMPHS ABS: 1.6 10*3/uL (ref 0.9–3.3)
LYMPHS PCT: 40 %
MCH: 35.8 pg — AB (ref 25.1–34.0)
MCHC: 34.9 g/dL (ref 31.5–36.0)
MCV: 102.3 fL — AB (ref 79.5–101.0)
MONOS PCT: 11 %
Monocytes Absolute: 0.4 10*3/uL (ref 0.1–0.9)
NEUTROS PCT: 45 %
Neutro Abs: 1.9 10*3/uL (ref 1.5–6.5)
Platelets: 147 10*3/uL (ref 145–400)
RBC: 3.74 MIL/uL (ref 3.70–5.45)
RDW: 12.4 % (ref 11.2–14.5)
WBC: 4.1 10*3/uL (ref 3.9–10.3)

## 2018-06-02 MED ORDER — HEPARIN SOD (PORK) LOCK FLUSH 100 UNIT/ML IV SOLN
500.0000 [IU] | Freq: Once | INTRAVENOUS | Status: AC | PRN
  Administered 2018-06-02: 500 [IU]
  Filled 2018-06-02: qty 5

## 2018-06-02 MED ORDER — ACETAMINOPHEN 325 MG PO TABS
650.0000 mg | ORAL_TABLET | Freq: Once | ORAL | Status: AC
  Administered 2018-06-02: 650 mg via ORAL

## 2018-06-02 MED ORDER — SODIUM CHLORIDE 0.9 % IV SOLN
Freq: Once | INTRAVENOUS | Status: AC
  Administered 2018-06-02: 15:00:00 via INTRAVENOUS
  Filled 2018-06-02: qty 250

## 2018-06-02 MED ORDER — SODIUM CHLORIDE 0.9% FLUSH
10.0000 mL | INTRAVENOUS | Status: DC | PRN
  Administered 2018-06-02: 10 mL via INTRAVENOUS
  Filled 2018-06-02: qty 10

## 2018-06-02 MED ORDER — INFLUENZA VAC SPLIT QUAD 0.5 ML IM SUSY
PREFILLED_SYRINGE | INTRAMUSCULAR | Status: AC
  Filled 2018-06-02: qty 0.5

## 2018-06-02 MED ORDER — SODIUM CHLORIDE 0.9% FLUSH
10.0000 mL | INTRAVENOUS | Status: DC | PRN
  Administered 2018-06-02: 10 mL
  Filled 2018-06-02: qty 10

## 2018-06-02 MED ORDER — SODIUM CHLORIDE 0.9 % IV SOLN
200.0000 mg | Freq: Once | INTRAVENOUS | Status: AC
  Administered 2018-06-02: 200 mg via INTRAVENOUS
  Filled 2018-06-02: qty 8

## 2018-06-02 MED ORDER — TRASTUZUMAB CHEMO 150 MG IV SOLR
6.0000 mg/kg | Freq: Once | INTRAVENOUS | Status: AC
  Administered 2018-06-02: 525 mg via INTRAVENOUS
  Filled 2018-06-02: qty 25

## 2018-06-02 MED ORDER — INFLUENZA VAC SPLIT QUAD 0.5 ML IM SUSY
0.5000 mL | PREFILLED_SYRINGE | Freq: Once | INTRAMUSCULAR | Status: AC
  Administered 2018-06-02: 0.5 mL via INTRAMUSCULAR

## 2018-06-02 MED ORDER — DIPHENHYDRAMINE HCL 25 MG PO CAPS
ORAL_CAPSULE | ORAL | Status: AC
  Filled 2018-06-02: qty 2

## 2018-06-02 MED ORDER — DIPHENHYDRAMINE HCL 25 MG PO CAPS
50.0000 mg | ORAL_CAPSULE | Freq: Once | ORAL | Status: AC
  Administered 2018-06-02: 50 mg via ORAL

## 2018-06-02 MED ORDER — ACETAMINOPHEN 325 MG PO TABS
ORAL_TABLET | ORAL | Status: AC
  Filled 2018-06-02: qty 2

## 2018-06-02 NOTE — Patient Instructions (Signed)
[facility] Discharge Instructions for Patients Receiving Chemotherapy  Today you received the following chemotherapy agents Herceptin, Keytruda  To help prevent nausea and vomiting after your treatment, we encourage you to take your nausea medication as directed   If you develop nausea and vomiting that is not controlled by your nausea medication, call the clinic.   BELOW ARE SYMPTOMS THAT SHOULD BE REPORTED IMMEDIATELY:  *FEVER GREATER THAN 100.5 F  *CHILLS WITH OR WITHOUT FEVER  NAUSEA AND VOMITING THAT IS NOT CONTROLLED WITH YOUR NAUSEA MEDICATION  *UNUSUAL SHORTNESS OF BREATH  *UNUSUAL BRUISING OR BLEEDING  TENDERNESS IN MOUTH AND THROAT WITH OR WITHOUT PRESENCE OF ULCERS  *URINARY PROBLEMS  *BOWEL PROBLEMS  UNUSUAL RASH Items with * indicate a potential emergency and should be followed up as soon as possible.  Feel free to call the clinic should you have any questions or concerns. The clinic phone number is [phone].  Please show the [facility] at check-in to the Emergency Department and triage nurse.

## 2018-06-02 NOTE — Telephone Encounter (Signed)
No LOS 9/18 °

## 2018-06-03 ENCOUNTER — Telehealth: Payer: Self-pay

## 2018-06-03 NOTE — Telephone Encounter (Signed)
Spoke with patient regarding CT appointment for 10/7 at 12:00 she has already picked up the prep, she knows to not have anything eat 4 hours prior.  I have adjusted her lab and port flush appointments to 10:30/11:00 so she does not have a huge gap of time in between having this done.   Scheduling message was sent.

## 2018-06-04 ENCOUNTER — Other Ambulatory Visit: Payer: Self-pay | Admitting: *Deleted

## 2018-06-04 ENCOUNTER — Other Ambulatory Visit: Payer: Self-pay

## 2018-06-04 DIAGNOSIS — D229 Melanocytic nevi, unspecified: Secondary | ICD-10-CM

## 2018-06-04 DIAGNOSIS — L570 Actinic keratosis: Secondary | ICD-10-CM | POA: Diagnosis not present

## 2018-06-04 DIAGNOSIS — D2262 Melanocytic nevi of left upper limb, including shoulder: Secondary | ICD-10-CM | POA: Diagnosis not present

## 2018-06-04 DIAGNOSIS — D225 Melanocytic nevi of trunk: Secondary | ICD-10-CM | POA: Diagnosis not present

## 2018-06-04 DIAGNOSIS — D2271 Melanocytic nevi of right lower limb, including hip: Secondary | ICD-10-CM | POA: Diagnosis not present

## 2018-06-04 DIAGNOSIS — D485 Neoplasm of uncertain behavior of skin: Secondary | ICD-10-CM | POA: Diagnosis not present

## 2018-06-04 HISTORY — DX: Melanocytic nevi, unspecified: D22.9

## 2018-06-04 MED FILL — TRIAMCINOLONE 0.1% CREAM: 0.1 | 30 days supply | Qty: 80 | Fill #0

## 2018-06-07 ENCOUNTER — Encounter: Payer: Self-pay | Admitting: Family Medicine

## 2018-06-07 ENCOUNTER — Ambulatory Visit (INDEPENDENT_AMBULATORY_CARE_PROVIDER_SITE_OTHER): Payer: Medicare Other | Admitting: Family Medicine

## 2018-06-07 VITALS — BP 138/77 | HR 90 | Temp 98.2°F | Ht 63.0 in | Wt 186.0 lb

## 2018-06-07 DIAGNOSIS — M810 Age-related osteoporosis without current pathological fracture: Secondary | ICD-10-CM | POA: Diagnosis not present

## 2018-06-07 DIAGNOSIS — J069 Acute upper respiratory infection, unspecified: Secondary | ICD-10-CM

## 2018-06-07 DIAGNOSIS — R7301 Impaired fasting glucose: Secondary | ICD-10-CM

## 2018-06-07 DIAGNOSIS — I10 Essential (primary) hypertension: Secondary | ICD-10-CM | POA: Diagnosis not present

## 2018-06-07 LAB — POCT GLYCOSYLATED HEMOGLOBIN (HGB A1C): HEMOGLOBIN A1C: 5.5 % (ref 4.0–5.6)

## 2018-06-07 NOTE — Progress Notes (Signed)
Subjective:    CC:   HPI:  Impaired fasting glucose-no increased thirst or urination. No symptoms consistent with hypoglycemia.  Hypertension- Pt denies chest pain, SOB, dizziness, or heart palpitations.  Taking meds as directed w/o problems.  Denies medication side effects.    She is also had some upper story symptoms for about 3 days.  She had a sore throat mild cough, sneezing and runny nose no fever chills or sweats.  She is been mostly using Tylenol and Benadryl since she really cannot take decongestants because of her blood pressure.  Her vitamin D was normal but on the low end of normal so she is now taking 800 IU of vitamin D daily.  Past medical history, Surgical history, Family history not pertinant except as noted below, Social history, Allergies, and medications have been entered into the medical record, reviewed, and corrections made.   Review of Systems: No fevers, chills, night sweats, weight loss, chest pain, or shortness of breath.   Objective:    General: Well Developed, well nourished, and in no acute distress.  Neuro: Alert and oriented x3, extra-ocular muscles intact, sensation grossly intact.  HEENT: Normocephalic, atraumatic, OP is clear, EOMi, PEERLA  Skin: Warm and dry, no rashes. Cardiac: Regular rate and rhythm, no murmurs rubs or gallops, no lower extremity edema.  Respiratory: Clear to auscultation bilaterally. Not using accessory muscles, speaking in full sentences.   Impression and Recommendations:    IFG  -  Well controlled. Continue current regimen. Follow up in  6 months.    HTN -   Well controlled. Continue current regimen. Follow up in  6 months.    URI -likely  viral.  Call back if not improving.  Osteoporosis-she is taking Boniva and she recently started calcium with vitamin D.

## 2018-06-09 MED FILL — ANASTROZOLE 1 MG TABLET: 1 | 30 days supply | Qty: 30 | Fill #1

## 2018-06-10 ENCOUNTER — Encounter (HOSPITAL_COMMUNITY): Payer: Self-pay | Admitting: Cardiology

## 2018-06-10 ENCOUNTER — Ambulatory Visit (HOSPITAL_COMMUNITY)
Admission: RE | Admit: 2018-06-10 | Discharge: 2018-06-10 | Disposition: A | Discharge status: Home / Self Care | Payer: Medicare Other | Source: Ambulatory Visit | Attending: Family Medicine | Admitting: Family Medicine

## 2018-06-10 ENCOUNTER — Ambulatory Visit (HOSPITAL_BASED_OUTPATIENT_CLINIC_OR_DEPARTMENT_OTHER)
Admission: RE | Admit: 2018-06-10 | Discharge: 2018-06-10 | Disposition: A | Discharge status: Home / Self Care | Payer: Medicare Other | Source: Ambulatory Visit | Attending: Cardiology | Admitting: Cardiology

## 2018-06-10 VITALS — BP 122/88 | HR 77 | Wt 186.0 lb

## 2018-06-10 DIAGNOSIS — Z170 Estrogen receptor positive status [ER+]: Secondary | ICD-10-CM

## 2018-06-10 DIAGNOSIS — Z8249 Family history of ischemic heart disease and other diseases of the circulatory system: Secondary | ICD-10-CM | POA: Insufficient documentation

## 2018-06-10 DIAGNOSIS — Z8542 Personal history of malignant neoplasm of other parts of uterus: Secondary | ICD-10-CM | POA: Insufficient documentation

## 2018-06-10 DIAGNOSIS — Z79899 Other long term (current) drug therapy: Secondary | ICD-10-CM | POA: Diagnosis not present

## 2018-06-10 DIAGNOSIS — K219 Gastro-esophageal reflux disease without esophagitis: Secondary | ICD-10-CM | POA: Diagnosis not present

## 2018-06-10 DIAGNOSIS — E785 Hyperlipidemia, unspecified: Secondary | ICD-10-CM | POA: Diagnosis not present

## 2018-06-10 DIAGNOSIS — I10 Essential (primary) hypertension: Secondary | ICD-10-CM | POA: Diagnosis not present

## 2018-06-10 DIAGNOSIS — C50211 Malignant neoplasm of upper-inner quadrant of right female breast: Secondary | ICD-10-CM

## 2018-06-10 NOTE — Progress Notes (Signed)
Oncologist: [name]  67 y.o. with history of HTN and hyperlipidemia with newly-diagnosed breast cancer and endometrial cancer. She was referred to cardio-oncology clinic by [name].   Breast cancer was diagnosed on the right in 8/18, ER+/PR+/HER2+. She has had lumpectomy and has had TCH x 6 cycles followed by Herceptin.   Endometrial cancer was also diagnosed in 8/18.  She has had hysterectomy/BSO in 9/18. She has finished radiation. She is on Keytruda for endometrial cancer.  She has 1 more Herceptin treatment left.   She is tolerating her treatment well.  No exertional dyspnea or chest pain.  BP controlled on current meds.   PMH: 1. GERD 2. HTN 3. Hyperlipidemia 4. Endometrial cancer: Diagnosed 8/18. Hysterectomy/BSO in 9/18.  5. Breast cancer: Diagnosed on the right in 8/18, ER+/PR-/HER2+.  S/p lumpectomy.  Plan for [place] x 6 cycles then Herceptin.  - Echo (8/18): EF 65-70%, GLS -18.2%, mild LVH.  - Echo (12/18): EF 60-65%, GLS -18.6%, normal RV size and systolic function.  - Echo (3/19): EF 60-65%, GLS -22.5%, normal RV size and systolic function.  - Echo (6/19): EF 60-65%, GLS -19.1%, normal RV size and systolic function.  - Echo (9/19): EF 55-60%, GLS -19.9%, mildly dilated RV with normal systolic function.   Social History   Socioeconomic History  . Marital status: Married    Spouse name: Not on file  . Number of children: Not on file  . Years of education: Not on file  . Highest education level: Not on file  Occupational History    Employer: [employer]    Comment: Nurse, mental health research   Social Needs  . Financial resource strain: Not on file  . Food insecurity:    Worry: Not on file    Inability: Not on file  . Transportation needs:    Medical: Not on file    Non-medical: Not on file  Tobacco Use  . Smoking status: Never Smoker  . Smokeless tobacco: Never Used  Substance and Sexual Activity  . Alcohol use: Yes    Alcohol/week: 0.0 standard drinks   Comment: <1/week wine or beer occasional  . Drug use: No  . Sexual activity: Yes    Birth control/protection: Post-menopausal  Lifestyle  . Physical activity:    Days per week: Not on file    Minutes per session: Not on file  . Stress: Not on file  Relationships  . Social connections:    Talks on phone: Not on file    Gets together: Not on file    Attends religious service: Not on file    Active member of club or organization: Not on file    Attends meetings of clubs or organizations: Not on file    Relationship status: Not on file  . Intimate partner violence:    Fear of current or ex partner: Not on file    Emotionally abused: Not on file    Physically abused: Not on file    Forced sexual activity: Not on file  Other Topics Concern  . Not on file  Social History Narrative   2-3 caffeine drinks per day. Regular exercise.  Walking 3 miles a day.    Family History  Problem Relation Age of Onset  . Colon cancer Father 20  . Heart attack Father 4  . Prostate cancer Father        dx 15's  . Bladder Cancer Father 38  . Stroke Mother 84  . Diabetes Maternal  Grandfather   . Kidney disease Maternal Grandfather   . Asthma Brother   . Cancer Paternal Grandmother 44       GYN cancer ( thinks ovarian, maybe uterine)  . Heart attack Maternal Grandmother 70  . Breast cancer Other 74  . Colon cancer Other 34  . Cancer Other        type unk, age dx unk   ROS: All systems reviewed and negative except as per HPI.   Current Outpatient Medications  Medication Sig Dispense Refill  . acetaminophen (TYLENOL) 500 MG tablet Take 500 mg by mouth every 6 (six) hours as needed for moderate pain.    Marland Kitchen anastrozole (ARIMIDEX) 1 MG tablet Take 1 tablet (1 mg total) by mouth daily. 30 tablet 3  . Ascorbic Acid (VITAMIN C) 1000 MG tablet Take 1,000 mg by mouth daily.    Marland Kitchen b complex vitamins capsule Take 1 capsule by mouth daily.    . Calcium Carbonate-Vitamin D3 (CALCIUM 600-D) 600-400 MG-UNIT  TABS Take 1-2 tablets by mouth daily.    . diphenhydrAMINE (BENADRYL) 25 MG tablet Take 25-50 mg by mouth every 6 (six) hours as needed for sleep.    Marland Kitchen ibandronate (BONIVA) 150 MG tablet Take in the morning, once a month,  with a full glass of water,on an empty stomach,do not take anything else by mouth or lie down for the next 30 min. 3 tablet 3  . lidocaine-prilocaine (EMLA) cream   2  . meclizine (ANTIVERT) 25 MG tablet Take 1-2 tablets (25-50 mg total) by mouth 3 (three) times daily as needed for dizziness. (Patient taking differently: Take 25-50 mg by mouth 3 (three) times daily as needed for dizziness (depends on dizziness if takes 1-2 tablets). ) 30 tablet 0  . Multiple Vitamin (MULTIVITAMIN) capsule Take 1 capsule by mouth daily.      Marland Kitchen olmesartan-hydrochlorothiazide (BENICAR HCT) 20-12.5 MG tablet Take 1 tablet by mouth daily. 90 tablet 2  . Pitavastatin Calcium (LIVALO) 2 MG TABS Take 1.5 tablets (3 mg total) by mouth every evening. Takes 1.5 tablet 135 tablet 3  . prednisoLONE acetate (PRED FORTE) 1 % ophthalmic suspension Place 1 drop into the left eye 3 (three) times daily.    . sodium chloride (MURO 128) 5 % ophthalmic solution Place 2 drops into both eyes daily.    Marland Kitchen triamcinolone cream (KENALOG) 0.1 % APPLY TO ITCHY SPOTS ONCE DAILY  1   No current facility-administered medications for this encounter.    Blood pressure 122/88, pulse 77, weight 84.4 kg (186 lb), SpO2 98 %. General: NAD Neck: No JVD, no thyromegaly or thyroid nodule.  Lungs: Clear to auscultation bilaterally with normal respiratory effort. CV: Nondisplaced PMI.  Heart regular S1/S2, no S3/S4, 1/6 SEM RUSB.  No peripheral edema.  No carotid bruit.  Normal pedal pulses.  Abdomen: Soft, nontender, no hepatosplenomegaly, no distention.  Skin: Intact without lesions or rashes.  Neurologic: Alert and oriented x 3.  Psych: Normal affect. Extremities: No clubbing or cyanosis.  HEENT: Normal.   Assessment/Plan: 1.  Breast cancer: ER+/PR-/HER2+. She is getting a Herceptin-based regimen, 1 more dose left. I reviewed today's echo.  LV EF and strain were normal. She will not need any additional echoes at this point.     2. Endometrial cancer: s/p hysterectomy and BSO. The Keytruda that she is taking does not appear to have cardiac side effects.  3. HTN: BP controlled.   Loralie Champagne 06/10/2018

## 2018-06-10 NOTE — Progress Notes (Signed)
  Echocardiogram 2D Echocardiogram has been performed.  Dawn [name] 06/10/2018, 9:07 AM

## 2018-06-15 NOTE — Progress Notes (Signed)
[place]  [phone] [phone]  Clinic Follow up Note   Patient Care Team: [name], MD as PCP - Haskell Riling, MD as Consulting Physician (General Surgery) Truitt Merle, MD as Consulting Physician (Hematology) [name], MD as Attending Physician (Radiation Oncology)   Date of Service:  06/23/2018  CHIEF COMPLAINTS:  Follow up right breast cancer and endometrial cancer    Oncology History   MSI high     Malignant neoplasm of upper-inner quadrant of right breast in female, estrogen receptor positive ([place])   04/27/2017 Initial Biopsy    Diagnosis Breast, right, needle core biopsy INVASIVE DUCTAL CARCINOMA, GRADE 2 Microscopic Comment The neoplasm has intracellular mucin and signet ring cell features. Immunostains shows these cells are positive for ER,GATA3, ck7 and GCDFP, negative for ck20, cdx2, TTF-1 and pax8, The immunostaining pattern supports the neoplasm is breast primary.    04/27/2017 Receptors her2    Estrogen Receptor: 70%, POSITIVE, MODERATE STAINING INTENSITY Progesterone Receptor: 0%, NEGATIVE Proliferation Marker Ki67: 30% HER2 - **POSITIVE** RATIO OF HER2/CEP17 SIGNALS 2.91 AVERAGE HER2 COPY NUMBER PER CELL 7.28    04/27/2017 Initial Diagnosis    Malignant neoplasm of upper-inner quadrant of right breast in female, estrogen receptor positive ([place])    05/07/2017 Imaging    CT Chest W Contrast 05/07/17 IMPRESSION: Tiny well-defined fatty lesion on the pleura at the right lung base. The thinner slice collimation used for today's chest CT eliminates volume-averaging seen in the lesion on the prior exam and confirms that this is a diffusely fatty nodule. This is a benign finding and likely represents a tiny lipoma. No defect in the hemidiaphragm evident to suggest tiny diaphragmatic hernia. Pulmonary hamartoma a consideration although the lack of soft tissue components makes this less  likely.    05/15/2017 Genetic Testing    Patient had genetic testing due to a personal history of breast cancer and uterine cancer as well as a family history of cancer. The Multi-Cancer panel was ordered. The Multi-Cancer Panel offered by [facility] includes sequencing and/or deletion duplication testing of the following 83 genes: ALK, APC, ATM, AXIN2,BAP1,  BARD1, BLM, BMPR1A, BRCA1, BRCA2, BRIP1, CASR, CDC73, CDH1, CDK4, CDKN1B, CDKN1C, CDKN2A (p14ARF), CDKN2A (p16INK4a), CEBPA, CHEK2, CTNNA1, DICER1, DIS3L2, EGFR (c.2369C>T, p.Thr790Met variant only), EPCAM (Deletion/duplication testing only), FH, FLCN, GATA2, GPC3, GREM1 (Promoter region deletion/duplication testing only), HOXB13 (c.251G>A, p.Gly84Glu), HRAS, KIT, MAX, MEN1, MET, MITF (c.952G>A, p.Glu318Lys variant only), MLH1, MSH2, MSH3, MSH6, MUTYH, NBN, NF1, NF2, NTHL1, PALB2, PDGFRA, PHOX2B, PMS2, POLD1, POLE, POT1, PRKAR1A, PTCH1, PTEN, RAD50, RAD51C, RAD51D, RB1, RECQL4, RET, RUNX1, SDHAF2, SDHA (sequence changes only), SDHB, SDHC, SDHD, SMAD4, SMARCA4, SMARCB1, SMARCE1, STK11, SUFU, TERC, TERT, TMEM127, TP53, TSC1, TSC2, VHL, WRN and WT1.   Results: No pathogenic mutations were identified. A VUS in the GATA2 gene c.1348G>A (p.Gly450Arg) was identified.  The date of this test report is 05/25/2017.      05/25/2017 Surgery    RIGHT BREAST LUMPECTOMY WITH RADIOACTIVE SEED AND  RIGHT AXILLARY SENTINEL LYMPH NODE BIOPSY and Pot placement by Dr. Excell Seltzer 05/25/17    05/25/2017 Pathology Results    Diagnosis 05/25/17 1. Breast, lumpectomy, right - INVASIVE DUCTAL CARCINOMA, GRADE II/III, SPANNING 2.2 CM. - DUCTAL CARCINOMA IN SITU, HIGH GRADE. - THE SURGICAL RESECTION MARGINS ARE NEGATIVE FOR CARCINOMA. - SEE ONCOLOGY TABLE BELOW. 2. Breast, excision, right additional superior margin - BENIGN FIBROADIPOSE TISSUE. - BENIGN SKELETAL MUSCLE. - SEE COMMENT. 3. Breast, excision, right additional lateral margin - BENIGN  BREAST PARENCHYMA. - THERE IS  NO EVIDENCE OF MALIGNANCY. - SEE COMMENT. 4. Breast, excision, chest wall margin - BENIGN FIBROADIPOSE TISSUE. - THERE IS NO EVIDENCE OF MALIGNANCY. - SEE COMMENT. 5. Lymph node, sentinel, biopsy, right axillary - THERE IS NO EVIDENCE OF CARCINOMA IN 1 OF 1 LYMPH NODE (0/1). 6. Lymph node, sentinel, biopsy, right axillary - THERE IS NO EVIDENCE OF CARCINOMA IN 1 OF 1 LYMPH NODE (0/1). 7. Lymph node, sentinel, biopsy, right axillary - THERE IS NO EVIDENCE OF CARCINOMA IN 1 OF 1 LYMPH NODE (0/1).    06/26/2017 PET scan    PET  IMPRESSION: 1. Postoperative findings both in the right breast and in the anatomic pelvis, with associated low-grade activity considered to be postoperative in nature. No hypermetabolic adenopathy or hypermetabolic lesions are identified to suggest active metastatic disease/malignancy. 2. Other imaging findings of potential clinical significance: Aortic Atherosclerosis (ICD10-I70.0). Sigmoid colon diverticulosis. Biapical pleuroparenchymal scarring in the lungs. Small amount of free pelvic fluid in the cul-de-sac, likely postoperative.     07/01/2017 - 10/14/2017 Chemotherapy    Adjuvant TCH with Onpro every 3 weeks for 6 cycles starting on 07/01/17, Changed Taxol to abraxane with cycle 4 due to drug rash reaction, followed by Herceptin every 3 weeks for 6 months     11/01/2017 - 12/08/2017 Radiation Therapy    Radiation therapy to her right breast with [name]    11/04/2017 -  Chemotherapy    Maintenance Herceptin starting 11/04/17 and will comeplete her 12 months in 06/2018     12/23/2017 -  Anti-estrogen oral therapy    Adjuvant letrozole 2.5 mg daily started 12/23/17, switched to exemestane on 04/21/18 due to joint pain. Could not afford aromasin, started anastrozole 04/2018.     12/30/2017 Imaging    CT CAP W Contrast 12/30/17 IMPRESSION: Increased size of 1.1 cm aorto-caval retroperitoneal lymph node. This could be reactive due to interval hysterectomy,  however metastatic carcinoma cannot be excluded. Consider continued attention on short-term follow-up CT, or PET-CT scan for further evaluation.  Mild hepatic steatosis.    01/25/2018 PET scan    IMPRESSION: 1. Enlarging hypermetabolic aortocaval lymph node is worrisome for metastatic disease. 2. Probable postoperative seroma in the medial right breast, with associated mild inflammatory hypermetabolism.    04/28/2018 Mammogram    Probably benign. Calcifications in the right breast most likely are dystrophic, related to previous surgery, and are probably benign.     04/28/2018 Imaging    DEXA Scan T Score -2.0    06/10/2018 Echocardiogram    06/10/2018 ECHO LV EF: 55% -   60%     Endometrial adenocarcinoma ([place])   05/07/2017 Initial Diagnosis    Endometrial adenocarcinoma ([place])    06/09/2017 Surgery    XI ROBOTIC ASSISTED TOTAL LAPOROSCOPIC HYSTERECTOMY WITH BILATERAL SALPINGO OOPHORECTOMY and SENTINEL NODE BIOPSY by Dr. Denman George 06/09/17    06/09/2017 Pathology Results    Diagnosis 06/09/17 1. Lymph node, sentinel, biopsy, right external iliac - METASTATIC ADENOCARCINOMA IN ONE LYMPH NODE (1/1). 2. Lymph node, sentinel, biopsy, left obturator - ONE BENIGN LYMPH NODE (0/1). 3. Lymph node, sentinel, biopsy, left external iliac - METASTATIC ADENOCARCINOMA IN ONE LYMPH NODE (1/1). 4. Uterus +/- tubes/ovaries, neoplastic ENDOMETRIUM: - ENDOMETRIAL ADENOCARCINOMA, 3.2 CM. - CARCINOMA INVADES INNER HALF OF MYOMETRIUM. 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 - CERVIX, BILATERAL FALLOPIAN TUBES AND BILATERAL OVARIES FREE OF TUMOR    08/18/2017 - 09/17/2017 Radiation Therapy    vaginal brachytherapy per [name] on  starting 08/18/17     Genetic Testing    Patient has genetic testing done for MSI. 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     Endometrial cancer ([place])   06/09/2017 Initial Diagnosis    Endometrial cancer ([place])    02/16/2018 -  Chemotherapy     The patient had pembrolizumab (KEYTRUDA) 200 mg in sodium chloride 0.9 % 50 mL chemo infusion, 200 mg, Intravenous, Once, 7 of 11 cycles Administration: 200 mg (02/17/2018), 200 mg (03/10/2018), 200 mg (03/31/2018), 200 mg (04/21/2018), 200 mg (06/02/2018), 200 mg (05/12/2018)  for chemotherapy treatment.      HISTORY OF PRESENTING ILLNESS (05/06/2017):  Dawn [name] 67 y.o. female is here because of newly diagnosed breast cancer. Screening mammogram detected right breast mass on 04/20/2017. Ultrasound showed 1.5 cm mass in the 1:00 position. The axilla was negative on ultrasound. Biopsy of the right breast was performed on 04/27/2017 revealing invasive ductal carcinoma, Grade 2, ER 70% / PR 0% / HER-2+ / Ki-67 30%.   Of note, she was also recently diagnosed with endometrial cancer. Biopsy of curettage endometrium on 04/29/2017 revealed adenocarcinoma. Immunohistology shows the tumor is positive with estrogen receptor, progesterone receptor with patchy positivity with carcinoembryonic antigen and p16. The tumor is negative with CD10 and vimentin. The morphology and immunophenotype are not definitive as to endocervical or endometrial origin. There are rare fragments with endometrial type stroma, which may indicate an endometrial origin although this is not definitive as to origin.  The patient presents today in our multidisciplinary breast clinic. She is doing well overall. She went to her PCP with abdominal pain and vaginal bleeding which felt like she was having her period all the time. Then she presented to the ER with severe vertigo and heavy vaginal bleeding. She has been having heavy vaginal bleeding since mid-July. She does not have a history of anemia and associates it with recent bleeding. She has been taking oral iron. She took an aspirin daily but, has not been taking it lately. She had been taking Evista for one year and stopped taking it on her own. She was taking this medication for her bone density.  She takes 2-3 Tylenol daily to manage the abdominal pain / cramping discomfort. She believes this pain is associated with passing clots. She takes a multivitamin daily. The patient is apprehensive about chemotherapy and is unsure if she would like to proceed with chemotherapy if it is recommended. She reports leg cramping. She does not take potassium supplements but, does try to eat bananas. States liver function is typically elevated and has been for about the last 2-3 years.   She has never smoked and doesn't drink alcohol. She did grow up with heavy smoking parents.   Father had colon cancer diagnosed at 10 years-old, and he died of bladder cancer. Paternal grandmother with some type of gynecologic cancer, age of onset in her mid-70s. Patient believes there was more cancer in her family in aunts and uncles however, there are no more living family members to discuss this with.   GYN HISTORY  Menarchal: 64 LMP: age of 43 Contraceptive: none  HRT: none  G2P2: She was 43 when she gave first live births.   CURRENT THERAPY:  -Maintenance Herceptin starting 11/04/17 and will complete 12 months in 06/2018 -Adjuvant letrozole 2.5 mg daily started 12/23/17, switched to exemestane on 04/21/18 due to joint pain. Could not afford aromasin, started anastrozole 04/2018.  -Keytruda every 21 days started on  02/17/2018 for recurrent endometrial cancer  INTERVAL HISTORY:   Dawn [name] is here for a follow up of her breast, endometrial cancer and ongoing treatment. Her 2019 mammogram was benign and her DEXA scan showed T score of -2.0. She was last seen by NP Lacie on 06/02/2018 when it was noted that she was not able to afford aromasin, therefore she started Anastrozole.  Today, she is here with her husband. She is doing well and has been exercising. She states that she lift weights at the gym, but is unsure if she's allowed to. She states that she has noticed multiple skin lesions that were "abnormal". She sees a  dermatologist for this. She is tolerating Keytruda well. She states that she experienced mild joint pain with Anastrozole.  She currently works part-time 16hr/week     MEDICAL HISTORY:  Past Medical History:  Diagnosis Date  . Anemia    as a child.  . Arthritis   . Bilateral cataracts   . Bilateral leg cramps   . Cancer ([place])    skin  . Dyspnea   . Family history of bladder cancer   . Family history of colon cancer in father   . Fuchs' corneal dystrophy   . GERD (gastroesophageal reflux disease)   . Hearing loss    Right side 30%  . History of hiatal hernia    small size  . History of radiation therapy 08/21/17, 08/28/17,09/01/17, 09/11/17, 09/17/17   Vaginal cuff brachytherapy.   Marland Kitchen History of radiation therapy 11/11/17- 12/08/17   Right Breast treated to 40.05 Gy with 15 fx of 2.67 Gy and a boost of 10 Gy with 5 fx.   . Hyperlipidemia   . Hypertension   . IBS (irritable bowel syndrome)    hx of  . Malignant neoplasm of upper-inner quadrant of right female breast ([place]) 04/2017  . NAFL (nonalcoholic fatty liver)   . Obesity   . PONV (postoperative nausea and vomiting)   . 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 Uterine cancer ([place]) 04/2017   endometrial cancer  . Varices, gastric   . Vertigo     SURGICAL HISTORY: Past Surgical History:  Procedure Laterality Date  . BREAST BIOPSY Right 04/27/2017  . BREAST LUMPECTOMY WITH RADIOACTIVE SEED AND SENTINEL LYMPH NODE BIOPSY Right 05/25/2017   Procedure: RIGHT BREAST LUMPECTOMY WITH RADIOACTIVE SEED AND  RIGHT AXILLARY SENTINEL LYMPH NODE BIOPSY;  Surgeon: Excell Seltzer, MD;  Location: [facility];  Service: General;  Laterality: Right;  . CATARACT EXTRACTION, BILATERAL    . COLONOSCOPY    . HYSTEROSCOPY W/D&C N/A 04/29/2017   Procedure: DILATATION AND CURETTAGE /HYSTEROSCOPY;  Surgeon: Linda Hedges, DO;  Location: [facility];  Service: Gynecology;  Laterality: N/A;  . PORTACATH  PLACEMENT Left 05/25/2017   Procedure: INSERTION PORT-A-CATH;  Surgeon: Excell Seltzer, MD;  Location: [facility];  Service: General;  Laterality: Left;  . ROBOTIC ASSISTED TOTAL HYSTERECTOMY WITH BILATERAL SALPINGO OOPHERECTOMY N/A 06/09/2017   Procedure: XI ROBOTIC ASSISTED TOTAL LAPOROSCOPIC HYSTERECTOMY WITH BILATERAL SALPINGO OOPHORECTOMY;  Surgeon: Everitt Amber, MD;  Location: [facility];  Service: Gynecology;  Laterality: N/A;  . SENTINEL NODE BIOPSY N/A 06/09/2017   Procedure: SENTINEL NODE BIOPSY;  Surgeon: Everitt Amber, MD;  Location: [facility];  Service: Gynecology;  Laterality: N/A;    SOCIAL HISTORY: Social History   Socioeconomic History  . Marital status: Married    Spouse name: Not on file  .  Number of children: Not on file  . Years of education: Not on file  . Highest education level: Not on file  Occupational History    Employer: [employer]    Comment: Nurse, mental health research   Social Needs  . Financial resource strain: Not on file  . Food insecurity:    Worry: Not on file    Inability: Not on file  . Transportation needs:    Medical: Not on file    Non-medical: Not on file  Tobacco Use  . Smoking status: Never Smoker  . Smokeless tobacco: Never Used  Substance and Sexual Activity  . Alcohol use: Yes    Alcohol/week: 0.0 standard drinks    Comment: <1/week wine or beer occasional  . Drug use: No  . Sexual activity: Yes    Birth control/protection: Post-menopausal  Lifestyle  . Physical activity:    Days per week: Not on file    Minutes per session: Not on file  . Stress: Not on file  Relationships  . Social connections:    Talks on phone: Not on file    Gets together: Not on file    Attends religious service: Not on file    Active member of club or organization: Not on file    Attends meetings of clubs or organizations: Not on file    Relationship status: Not on file  . Intimate partner violence:    Fear of current or ex  partner: Not on file    Emotionally abused: Not on file    Physically abused: Not on file    Forced sexual activity: Not on file  Other Topics Concern  . Not on file  Social History Narrative   2-3 caffeine drinks per day. Regular exercise.  Walking 3 miles a day.     FAMILY HISTORY: Family History  Problem Relation Age of Onset  . Colon cancer Father 21  . Heart attack Father 88  . Prostate cancer Father        dx 51's  . Bladder Cancer Father 7  . Stroke Mother 54  . Diabetes Maternal Grandfather   . Kidney disease Maternal Grandfather   . Asthma Brother   . Cancer Paternal Grandmother 60       GYN cancer ( thinks ovarian, maybe uterine)  . Heart attack Maternal Grandmother 70  . Breast cancer Other 33  . Colon cancer Other 64  . Cancer Other        type unk, age dx unk    ALLERGIES:  is allergic to ciprofloxacin; crestor [rosuvastatin calcium]; fosamax [alendronate sodium]; penicillins; and taxol [paclitaxel].  MEDICATIONS:  Current Outpatient Medications  Medication Sig Dispense Refill  . acetaminophen (TYLENOL) 500 MG tablet Take 500 mg by mouth every 6 (six) hours as needed for moderate pain.    Marland Kitchen anastrozole (ARIMIDEX) 1 MG tablet Take 1 tablet (1 mg total) by mouth daily. 30 tablet 3  . Ascorbic Acid (VITAMIN C) 1000 MG tablet Take 1,000 mg by mouth daily.    Marland Kitchen b complex vitamins capsule Take 1 capsule by mouth daily.    . Calcium Carbonate-Vitamin D3 (CALCIUM 600-D) 600-400 MG-UNIT TABS Take 1-2 tablets by mouth daily.    . diphenhydrAMINE (BENADRYL) 25 MG tablet Take 25-50 mg by mouth every 6 (six) hours as needed for sleep.    Marland Kitchen ibandronate (BONIVA) 150 MG tablet Take in the morning, once a month,  with a full glass of water,on an empty stomach,do  not take anything else by mouth or lie down for the next 30 min. 3 tablet 3  . lidocaine-prilocaine (EMLA) cream   2  . meclizine (ANTIVERT) 25 MG tablet Take 1-2 tablets (25-50 mg total) by mouth 3 (three) times  daily as needed for dizziness. (Patient taking differently: Take 25-50 mg by mouth 3 (three) times daily as needed for dizziness (depends on dizziness if takes 1-2 tablets). ) 30 tablet 0  . Multiple Vitamin (MULTIVITAMIN) capsule Take 1 capsule by mouth daily.      Marland Kitchen olmesartan-hydrochlorothiazide (BENICAR HCT) 20-12.5 MG tablet Take 1 tablet by mouth daily. 90 tablet 2  . Pitavastatin Calcium (LIVALO) 2 MG TABS Take 1.5 tablets (3 mg total) by mouth every evening. Takes 1.5 tablet 135 tablet 3  . prednisoLONE acetate (PRED FORTE) 1 % ophthalmic suspension Place 1 drop into the left eye 3 (three) times daily.    . sodium chloride (MURO 128) 5 % ophthalmic solution Place 2 drops into both eyes daily.    Marland Kitchen triamcinolone cream (KENALOG) 0.1 % APPLY TO ITCHY SPOTS ONCE DAILY  1   No current facility-administered medications for this visit.    Facility-Administered Medications Ordered in Other Visits  Medication Dose Route Frequency Provider Last Rate Last Dose  . heparin lock flush 100 unit/mL  500 Units Intracatheter Once PRN Truitt Merle, MD      . pembrolizumab [facility]) 200 mg in sodium chloride 0.9 % 50 mL chemo infusion  200 mg Intravenous Once Truitt Merle, MD 116 mL/hr at 06/23/18 1010 200 mg at 06/23/18 1010  . sodium chloride flush (NS) 0.9 % injection 10 mL  10 mL Intracatheter PRN Truitt Merle, MD      . trastuzumab (HERCEPTIN) 525 mg in sodium chloride 0.9 % 250 mL chemo infusion  6 mg/kg (Treatment Plan Recorded) Intravenous Once Truitt Merle, MD        REVIEW OF SYSTEMS:   Constitutional: Denies fevers, chills or abnormal night sweats  Eyes: Denies blurriness of vision, double vision or watery eyes Ears, nose, mouth, throat, and face: Denies mucositis or sore throat Respiratory: Denies cough, dyspnea or wheezes Cardiovascular: Denies palpitation, chest discomfort or lower extremity swelling Gastrointestinal:  Denies nausea, vomiting, constipation, diarrhea, heartburn or change in bowel  habits  Skin: Denies abnormal skin rashes. (+) multiple skin lesions Lymphatics: Denies new lymphadenopathy or easy bruising Musculoskeletal: (+) Musc/joint pain in her left shoulder and low back, (+) joint stiffness in her knees Neurological:Denies numbness, new weaknesses Behavioral/Psych: Mood is stable, no new changes  All other systems were reviewed with the patient and are negative.  PHYSICAL EXAMINATION:  ECOG PERFORMANCE STATUS: 1 - Symptomatic but completely ambulatory  Vitals:   06/23/18 0812  BP: 137/79  Pulse: 78  Resp: 18  Temp: 98.3 F (36.8 C)  SpO2: 100%   Filed Weights   06/23/18 0812  Weight: 187 lb 3.2 oz (84.9 kg)     GENERAL:alert, no distress and comfortable SKIN: skin color, texture, turgor are normal, no rashes (+) multiple small hyperpigmented lesions  EYES: normal, conjunctiva are pink and non-injected, sclera clear OROPHARYNX:no exudate, no erythema and lips, buccal mucosa, and tongue normal  NECK: supple, thyroid normal size, non-tender, without nodularity LYMPH:  no palpable lymphadenopathy in the cervical, axillary or inguinal LUNGS: clear to auscultation with normal breathing effort HEART: regular rate & rhythm and no lower extremity edema (+) heart murmur in the aortic valve ABDOMEN:abdomen soft, non-tender and normal bowel sounds, no organomegaly.  Previous surgical scars have healed very well.   Musculoskeletal:no cyanosis of digits and no clubbing (+) joint stiffness  PSYCH: alert & oriented x 3 with fluent speech NEURO: no focal motor/sensory deficits BREAST: No new palpable masses, nipple retraction, or discharge. No skin changes.(+) lumpy breast 1O clock position on right breast with mild tenderness (+) surgical scar healed well  LABORATORY DATA:  I have reviewed the data as listed CBC Latest Ref Rng & Units 06/21/2018 06/02/2018 05/12/2018  WBC 3.9 - 10.3 K/uL 4.3 4.1 3.6(L)  Hemoglobin 11.6 - 15.9 g/dL 13.4 13.4 13.2  Hematocrit 34.8 -  46.6 % 38.0 38.3 37.8  Platelets 145 - 400 K/uL 167 147 139(L)    CMP Latest Ref Rng & Units 06/21/2018 06/02/2018 05/12/2018  Glucose 70 - 99 mg/dL 100(H) 130(H) 165(H)  BUN 8 - 23 mg/dL _0 Creatinine 0.44 - 1.00 mg/dL 0.79 0.83 0.82  Sodium 135 - 145 mmol/L 135 138 135  Potassium 3.5 - 5.1 mmol/L 3.6 3.4(L) 3.3(L)  Chloride 98 - 111 mmol/L 98 102 99  CO2 22 - 32 mmol/L _1 Calcium 8.9 - 10.3 mg/dL 9.3 9.4 9.1  Total Protein 6.5 - 8.1 g/dL 7.5 7.3 7.0  Total Bilirubin 0.3 - 1.2 mg/dL 0.6 0.6 0.5  Alkaline Phos 38 - 126 U/L 53 49 48  AST 15 - 41 U/L 60(H) 62(H) 89(H)  ALT 0 - 44 U/L 105(H) 108(H) 143(H)   PATHOLOGY FINDINGS:  Diagnosis 06/09/17 1. Lymph node, sentinel, biopsy, right external iliac - METASTATIC ADENOCARCINOMA IN ONE LYMPH NODE (1/1). 2. Lymph node, sentinel, biopsy, left obturator - ONE BENIGN LYMPH NODE (0/1). 3. Lymph node, sentinel, biopsy, left external iliac - METASTATIC ADENOCARCINOMA IN ONE LYMPH NODE (1/1). 4. Uterus +/- tubes/ovaries, neoplastic ENDOMETRIUM: - ENDOMETRIAL ADENOCARCINOMA, 3.2 CM. - CARCINOMA INVADES INNER HALF OF MYOMETRIUM. 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 - CERVIX, BILATERAL FALLOPIAN TUBES AND BILATERAL OVARIES FREE OF TUMOR. Microscopic Comment 4. ONCOLOGY TABLE-UTERUS, CARCINOMA OR CARCINOSARCOMA Specimen: Uterus with bilateral fallopian tubes and ovaries, right and left external iliac nodes and left obturator lymph node. Procedure: Hysterectomy with bilateral salpingo-oophorectomy and lymph node biopsies. Lymph node sampling performed: Yes. Specimen integrity: Intact. Maximum tumor size: 3.2 cm. Histologic type: Mixed, endometrioid, clear cell and serous. Grade: III. Myometrial invasion: 0.8 cm where myometrium is 2.2 cm in thickness. Cervical stromal involvement: No. Extent of involvement of other organs: N/A. Lymph - vascular invasion: Present. Peritoneal washings: N/A. Lymph nodes: Examined: 3  Sentinel 0 Non-sentinel 3 Total Lymph nodes with metastasis: 2. Isolated tumor cells (< 0.2 mm): 0. Micrometastasis: (> 0.2 mm and < 2.0 mm): 0. Macrometastasis: (> 2.0 mm): 2. Extracapsular extension: No. TNM code: pT1a, pN1. FIGO Stage (based on pathologic findings, needs clinical correlation): IIIC1. Comments: The tumor is 3.2 cm in greatest dimension and invades the myometrium to a depth of 0.8 cm where the myometrium is 2.2 cm in thickness and there is lymphatic vascular involvement by tumor. 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 (JDP:ah 06/10/17)   Diagnosis 05/25/17 1. Breast, lumpectomy, right - INVASIVE DUCTAL CARCINOMA, GRADE II/III, SPANNING 2.2 CM. - DUCTAL CARCINOMA IN SITU, HIGH GRADE. - THE SURGICAL RESECTION MARGINS ARE NEGATIVE FOR CARCINOMA. - SEE ONCOLOGY TABLE BELOW. 2. Breast, excision, right additional superior margin - BENIGN FIBROADIPOSE TISSUE. - BENIGN SKELETAL MUSCLE. - SEE COMMENT. 3. Breast, excision, right additional lateral margin - BENIGN BREAST PARENCHYMA. - THERE IS NO EVIDENCE  OF MALIGNANCY. - SEE COMMENT. 4. Breast, excision, chest wall margin - BENIGN FIBROADIPOSE TISSUE. - THERE IS NO EVIDENCE OF MALIGNANCY. - SEE COMMENT. 5. Lymph node, sentinel, biopsy, right axillary - THERE IS NO EVIDENCE OF CARCINOMA IN 1 OF 1 LYMPH NODE (0/1). 6. Lymph node, sentinel, biopsy, right axillary - THERE IS NO EVIDENCE OF CARCINOMA IN 1 OF 1 LYMPH NODE (0/1). 7. Lymph node, sentinel, biopsy, right axillary - THERE IS NO EVIDENCE OF CARCINOMA IN 1 OF 1 LYMPH NODE (0/1). Microscopic Comment 1. BREAST, INVASIVE TUMOR Procedure: Seed localized lumpectomy, additional superior, lateral, and chest wall margin resections and axillary lymph node resections. Laterality: Right. Tumor Size: 2.2 cm (gross measurement). Histologic Type: Ductal. Microscopic Comment(continued) Grade: II. Tubular Differentiation: 2. Nuclear  Pleomorphism: 2. Mitotic Count: 2. Ductal Carcinoma in Situ (DCIS): Present, high grade. Extent of Tumor: Confined to breast parenchyma. Margins: Greater than or equal to 0.2 cm to all margins. Regional Lymph Nodes: Number of Lymph Nodes Examined: 3. Number of Sentinel Lymph Nodes Examined: 3. Lymph Nodes with Macrometastases: 0. Lymph Nodes with Micrometastases: 0. Lymph Nodes with Isolated Tumor Cells: 0. Breast Prognostic Profile: Case [id number]. Estrogen Receptor: 70%, moderate. Progesterone Receptor: 0%. Her2: Amplification was detected. The ratio was 2.91. Ki-67: 30%. Best tumor block for sendout testing: 1A-1C. Pathologic Stage Classification (pTNM, AJCC 8th Edition): Primary Tumor (pT): pT2. Regional Lymph Nodes (pN): pN0. Distant Metastases (pM): pMX. 2. - 4. The surgical resection margin(s) of the specimen were inked and microscopically evaluated.  Diagnosis 04/29/2017 Endometrium, curettage - ADENOCARCINOMA. - SEE MICROSCOPIC DESCRIPTION. Microscopic Comment There are fragments of adenocarcinoma, some of which have cribriform architecture, and there is focal extracellular mucin. Immunohistochemistry shows the tumor is positive with estrogen receptor, progesterone receptor with patchy positivity with carcinoembryonic antigen and p16. The tumor is negative with CD10 and vimentin. The morphology and immunophenotype are not definitive as to endocervical or endometrial origin. There are rare fragments with endometrial type stroma, which may indicate an endometrial origin although this is not definitive as to origin.  Diagnosis 04/27/2017 Breast, right, needle core biopsy INVASIVE DUCTAL CARCINOMA, GRADE 2 Microscopic Comment The neoplasm has intracellular mucin and signet ring cell features. Immunostains shows these cells are positive for ER, GATA3, ck7 and GCDFP, negative for ck20, cdx2, TTF-1 and pax8, The immunostaining pattern supports the neoplasm is breast  primary. The Breast prognostic profile has been ordered. Results: IMMUNOHISTOCHEMICAL AND MORPHOMETRIC ANALYSIS PERFORMED MANUALLY Estrogen Receptor: 70%, POSITIVE, MODERATE STAINING INTENSITY Progesterone Receptor: 0%, NEGATIVE Proliferation Marker Ki67: 30% Results: HER2 - **POSITIVE** RATIO OF HER2/CEP17 SIGNALS 2.91 AVERAGE HER2 COPY NUMBER PER CELL 7.28  GENETICS 05/07/17   PROCEDURES  06/10/2018 ECHO LV EF: 55% -   60%  ECHO 11/30/17 Impressions: - Normal LV size and systolic function, EF 63-84%. Strain as above.   Normal RV size and systolic function. No significant valvular   abnormalities.  ECHO 08/27/17  Impressions: - Normal LV size and systolic function, EF 66-59%. Strain as above.   Mildly dilated RV with normal systolic function.  ECHO 05/07/17 Impressions: - Normal LV size with mild LV hypertrophy. EF 65-70%, vigorous   systolic function. Strain as above. Normal RV size and systolic   function. No significant valvular abnormalities.  RADIOGRAPHIC STUDIES: I have personally reviewed the radiological images as listed and agreed with the findings in the report.  04/28/2018 Mammogram   04/28/2018 DEXA Scan    ASSESSMENT & PLAN:  67 y.o. 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 Malignant neoplasm of upper inner quadrant of right breast , Invasive ductal carcinoma, pT2N0M0, G2,  ER+/PR-/HER2+ -She underwent right breast lumpectomy with sentinel lymph node biopsy with Dr. Excell Seltzer on 05/25/2017 with port placement -Pathology confirmed invasive ductal carcinoma, grade 2, spanning 2.2 cm, margins were clear, no positive lymph nodes, ER positive, PR negative, HER-2 positive. I reviewed this with the patient previously -We previously discussed the risk of cancer recurrence after completed surgical resection. Due to the HER-2 positive disease, she is at moderate to high risk  I  recommended adjuvant chemotherapy at that time. The regiment was selected to cover for endometrial cancer also. -She completed adjuvant TCH every 3 weeks with Onpro from 07/01/17-10/14/17. I changed Taxol to abraxane with cycle 4 due to drug rash reaction. She tolerated relatively well besides the rash and Grade 1 neuropathy. -She is currently on Herceptin maintenance therapy began on 11/04/17. Plan for a total of 12 months.  She will complete today. -She completed adjuvant radiation therapy on her breast on 11/01/17-12/08/17 with [name]. -She started Letrozole on 12/23/17. Given her new onset joint pain and stiffness I switched her to exemestane. I discussed the possibility of a high co-pay. She is agreeable to try. I prescribed on 04/21/2018. She was not able to afford aromasin, and was therefore switched to Anastrozole on 04/2018. -Labs reviewed, Last CBC showed Hg 13.4 MCV 101.9. CMP showed AST 60 and ALT 105. -I discussed the role of adjuvant Neratinib, which is approved for HER-2 positive early stage breast cancer, especially for positive disease.  I discussed the benefit of decreased cancer recurrence by 3 to 4%, and reviewed the side effects including diarrhea, fatigue, abnormal liver functions, etc. and advised her to use imodium as needed. I discussed that the risk of heart failure is lower than Herceptin, but I will likely order an echocardiogram after she completes a year treatment.  She is interested, and will think about it.  I gave her a handout of neratinib. -F/u in 3 weeks    2. 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 Denman George on 06/09/17 -We previously discussed her surgical pathology. she has high risk features including lymphovascular involvement, nodal positive disease, and mixed histology with endometrioid, clear cell, and serous  carcinoma  -We previously discussed the high risk of cancer recurrence after surgery extensively, and I strongly encouraged her to consider adjuvant chemotherapy to reduce her risk of recurrence. -She completed Taxol/carbo (taxol changed to abraxane) every 3 weeks x6 cycles on 10/14/17 and adjuvant vaginal brachytherapy from 08/18/17-09/17/17. She tolerated well overall. She experiences mild vaginal bleeding when using dilator as instructed per rad onc. -Her 12/30/17 CT scan showed 1.1 cm aorta-caval retroperitoneal lymph node which is borderline reactive. This was further evaluated by PET in 01/2018 which showed mild FDG activity, no other metastasis. -Her case was extensively discussed in GYN tumor board, especially with Drs. Cleda Clarks and Dr. Rhodia Albright at [facility]. The node is very difficult to biopsy, and the consensus is starting systemic therapy due to the high possibility of node recurrence.  -I discussed her endometrial cancer was tested for MSI and resulted MSI-high, which predicts good response to immunotherapy. She had genetic testing which was negative for Lynch syndrome.  -I previously recommended her to start immunotherapy Keytruda, instead of chemo,  due to her MSI-H disease. Potential side effects were discussed. She voiced good understanding and agrees to proceed. -We previously discussed that the goal of therapy is palliative, for disease control, although there is small possibility immunotherapy may cure her recurrence.  -She is tolerating Keytruda very well, will continue today and every 3 weeks. -I reviewed her recent restaging CT scan, which showed resolved retroperitoneal adenopathy, no other lesions, she has had excellent response to treatment.  We will continue Keytruda every 3 weeks, for up to 2 years. -Plan to repeat a scan in 3-4 months   3. Genetics -Given her personal history of breast and endometrial cancer, and a family history of malignancy.  -She was previously referred to  genetics and results were negative   4. HTN -We monitored her Hypertension while on chemo, she previously held HCTZ on days of chemo -F/u with PCP -BP normal lately  5. Transaminitis  -LFTs are chronically elevated due to fatty liver disease -We monitored her liver functions closely while on chemo, stable overall  -Will continue monitoring   6. Bone Health  -I previously discussed that aromatase inhibitors can potentially weaken the bone.  -She is on monthly boniva, prescribed by her PCP, since 02/2018.  -I strongly encouraged her to also take Vitamin D and Calcium supplements. -I will check her Vitamin D level every 6 months.  -DEXA scan in 05/2018 revealed T score of -2.0.  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 I previously discussed this can take 1-2 years to recover.  -She will continue B complex. I previously recommended Neurontin for her tingling and pain. I discussed side effects. She would like to hold off for now.  -I previously encouraged her to remain active with exercise.  -Her neuropathy is improving slowly.   8. Fatigue  -she has having mild fatigue, worse after Keytruda treatment, does recover well -She previously wished to continue working 2 days a week no heavy lifting.  I filled out her FMLA paperwork previously. -She currently works part-time 16hr/week  Follow-up: -Lab and scan reviewed, will continue Herceptin, last dose today, and continue Keytruda  -lab, flush, f/u and Keytruda in 3 and 6 weeks  -will prescribe Neratinib on next visit if she agrees    No orders of the defined types were placed in this encounter.  Pt and her husband had many questions. All questions were answered. The patient knows to call the clinic with any problems, questions or concerns.  I spent 25 minutes counseling the patient face to face. The total time spent in the appointment was 30 minutes and more than 50% was on counseling.  Dierdre Searles Dweik am acting as scribe  for Dr. Truitt Merle.  I have reviewed the above documentation for accuracy and completeness, and I agree with the above.    Truitt Merle  06/23/2018

## 2018-06-21 ENCOUNTER — Inpatient Hospital Stay: Payer: Medicare Other | Attending: Hematology

## 2018-06-21 ENCOUNTER — Ambulatory Visit (HOSPITAL_COMMUNITY)
Admission: RE | Admit: 2018-06-21 | Discharge: 2018-06-21 | Disposition: A | Discharge status: Home / Self Care | Payer: Medicare Other | Source: Ambulatory Visit | Attending: Hematology | Admitting: Hematology

## 2018-06-21 ENCOUNTER — Other Ambulatory Visit: Payer: Medicare Other

## 2018-06-21 ENCOUNTER — Encounter (HOSPITAL_COMMUNITY): Payer: Self-pay

## 2018-06-21 ENCOUNTER — Inpatient Hospital Stay: Payer: Medicare Other

## 2018-06-21 DIAGNOSIS — G629 Polyneuropathy, unspecified: Secondary | ICD-10-CM | POA: Diagnosis not present

## 2018-06-21 DIAGNOSIS — Z79811 Long term (current) use of aromatase inhibitors: Secondary | ICD-10-CM | POA: Insufficient documentation

## 2018-06-21 DIAGNOSIS — I10 Essential (primary) hypertension: Secondary | ICD-10-CM | POA: Diagnosis not present

## 2018-06-21 DIAGNOSIS — C541 Malignant neoplasm of endometrium: Secondary | ICD-10-CM | POA: Insufficient documentation

## 2018-06-21 DIAGNOSIS — Z5112 Encounter for antineoplastic immunotherapy: Secondary | ICD-10-CM | POA: Insufficient documentation

## 2018-06-21 DIAGNOSIS — E669 Obesity, unspecified: Secondary | ICD-10-CM | POA: Diagnosis not present

## 2018-06-21 DIAGNOSIS — Z923 Personal history of irradiation: Secondary | ICD-10-CM | POA: Diagnosis not present

## 2018-06-21 DIAGNOSIS — Z170 Estrogen receptor positive status [ER+]: Secondary | ICD-10-CM | POA: Diagnosis not present

## 2018-06-21 DIAGNOSIS — M818 Other osteoporosis without current pathological fracture: Secondary | ICD-10-CM | POA: Diagnosis not present

## 2018-06-21 DIAGNOSIS — Z7982 Long term (current) use of aspirin: Secondary | ICD-10-CM | POA: Diagnosis not present

## 2018-06-21 DIAGNOSIS — D709 Neutropenia, unspecified: Secondary | ICD-10-CM | POA: Diagnosis not present

## 2018-06-21 DIAGNOSIS — Z79899 Other long term (current) drug therapy: Secondary | ICD-10-CM | POA: Diagnosis not present

## 2018-06-21 DIAGNOSIS — Z9221 Personal history of antineoplastic chemotherapy: Secondary | ICD-10-CM | POA: Diagnosis not present

## 2018-06-21 DIAGNOSIS — C50211 Malignant neoplasm of upper-inner quadrant of right female breast: Secondary | ICD-10-CM | POA: Diagnosis not present

## 2018-06-21 DIAGNOSIS — C775 Secondary and unspecified malignant neoplasm of intrapelvic lymph nodes: Secondary | ICD-10-CM | POA: Insufficient documentation

## 2018-06-21 DIAGNOSIS — D696 Thrombocytopenia, unspecified: Secondary | ICD-10-CM | POA: Insufficient documentation

## 2018-06-21 LAB — CBC WITH DIFFERENTIAL/PLATELET
BASOS ABS: 0 10*3/uL (ref 0.0–0.1)
BASOS PCT: 1 %
Eosinophils Absolute: 0.1 10*3/uL (ref 0.0–0.5)
Eosinophils Relative: 2 %
HEMATOCRIT: 38 % (ref 34.8–46.6)
Hemoglobin: 13.4 g/dL (ref 11.6–15.9)
Lymphocytes Relative: 43 %
Lymphs Abs: 1.9 10*3/uL (ref 0.9–3.3)
MCH: 36 pg — ABNORMAL HIGH (ref 25.1–34.0)
MCHC: 35.3 g/dL (ref 31.5–36.0)
MCV: 101.9 fL — ABNORMAL HIGH (ref 79.5–101.0)
MONO ABS: 0.5 10*3/uL (ref 0.1–0.9)
Monocytes Relative: 12 %
NEUTROS ABS: 1.8 10*3/uL (ref 1.5–6.5)
Neutrophils Relative %: 42 %
PLATELETS: 167 10*3/uL (ref 145–400)
RBC: 3.73 MIL/uL (ref 3.70–5.45)
RDW: 12.4 % (ref 11.2–14.5)
WBC: 4.3 10*3/uL (ref 3.9–10.3)

## 2018-06-21 LAB — COMPREHENSIVE METABOLIC PANEL
ALBUMIN: 4 g/dL (ref 3.5–5.0)
ALT: 105 U/L — AB (ref 0–44)
AST: 60 U/L — AB (ref 15–41)
Alkaline Phosphatase: 53 U/L (ref 38–126)
Anion gap: 8 (ref 5–15)
BUN: 10 mg/dL (ref 8–23)
CHLORIDE: 98 mmol/L (ref 98–111)
CO2: 29 mmol/L (ref 22–32)
Calcium: 9.3 mg/dL (ref 8.9–10.3)
Creatinine, Ser: 0.79 mg/dL (ref 0.44–1.00)
GFR calc Af Amer: >60 mL/min (ref >60)
GFR calc non Af Amer: >60 mL/min (ref >60)
GLUCOSE: 100 mg/dL — AB (ref 70–99)
POTASSIUM: 3.6 mmol/L (ref 3.5–5.1)
Sodium: 135 mmol/L (ref 135–145)
Total Bilirubin: 0.6 mg/dL (ref 0.3–1.2)
Total Protein: 7.5 g/dL (ref 6.5–8.1)

## 2018-06-21 MED ORDER — IOHEXOL 300 MG/ML  SOLN
100.0000 mL | Freq: Once | INTRAMUSCULAR | Status: AC | PRN
  Administered 2018-06-21: 100 mL via INTRAVENOUS

## 2018-06-21 MED ORDER — SODIUM CHLORIDE 0.9 % IJ SOLN
INTRAMUSCULAR | Status: AC
  Filled 2018-06-21: qty 50

## 2018-06-21 MED ORDER — HEPARIN SOD (PORK) LOCK FLUSH 100 UNIT/ML IV SOLN
INTRAVENOUS | Status: AC
  Filled 2018-06-21: qty 5

## 2018-06-23 ENCOUNTER — Inpatient Hospital Stay: Payer: Medicare Other

## 2018-06-23 ENCOUNTER — Inpatient Hospital Stay (HOSPITAL_BASED_OUTPATIENT_CLINIC_OR_DEPARTMENT_OTHER): Payer: Medicare Other | Admitting: Hematology

## 2018-06-23 ENCOUNTER — Encounter: Payer: Self-pay | Admitting: Hematology

## 2018-06-23 VITALS — BP 137/79 | HR 78 | Temp 98.3°F | Resp 18 | Ht 63.0 in | Wt 187.2 lb

## 2018-06-23 DIAGNOSIS — R5383 Other fatigue: Secondary | ICD-10-CM | POA: Diagnosis not present

## 2018-06-23 DIAGNOSIS — C775 Secondary and unspecified malignant neoplasm of intrapelvic lymph nodes: Secondary | ICD-10-CM

## 2018-06-23 DIAGNOSIS — Z170 Estrogen receptor positive status [ER+]: Secondary | ICD-10-CM

## 2018-06-23 DIAGNOSIS — G620 Drug-induced polyneuropathy: Secondary | ICD-10-CM | POA: Diagnosis not present

## 2018-06-23 DIAGNOSIS — C50211 Malignant neoplasm of upper-inner quadrant of right female breast: Secondary | ICD-10-CM | POA: Diagnosis not present

## 2018-06-23 DIAGNOSIS — C541 Malignant neoplasm of endometrium: Secondary | ICD-10-CM

## 2018-06-23 DIAGNOSIS — Z5112 Encounter for antineoplastic immunotherapy: Secondary | ICD-10-CM | POA: Diagnosis not present

## 2018-06-23 DIAGNOSIS — I10 Essential (primary) hypertension: Secondary | ICD-10-CM | POA: Diagnosis not present

## 2018-06-23 DIAGNOSIS — Z923 Personal history of irradiation: Secondary | ICD-10-CM | POA: Diagnosis not present

## 2018-06-23 DIAGNOSIS — M810 Age-related osteoporosis without current pathological fracture: Secondary | ICD-10-CM

## 2018-06-23 DIAGNOSIS — M818 Other osteoporosis without current pathological fracture: Secondary | ICD-10-CM | POA: Diagnosis not present

## 2018-06-23 DIAGNOSIS — D696 Thrombocytopenia, unspecified: Secondary | ICD-10-CM | POA: Diagnosis not present

## 2018-06-23 DIAGNOSIS — E669 Obesity, unspecified: Secondary | ICD-10-CM | POA: Diagnosis not present

## 2018-06-23 MED ORDER — DIPHENHYDRAMINE HCL 25 MG PO CAPS
ORAL_CAPSULE | ORAL | Status: AC
  Filled 2018-06-23: qty 2

## 2018-06-23 MED ORDER — SODIUM CHLORIDE 0.9 % IV SOLN
Freq: Once | INTRAVENOUS | Status: AC
  Administered 2018-06-23: 10:00:00 via INTRAVENOUS
  Filled 2018-06-23: qty 250

## 2018-06-23 MED ORDER — SODIUM CHLORIDE 0.9% FLUSH
10.0000 mL | INTRAVENOUS | Status: DC | PRN
  Administered 2018-06-23: 10 mL
  Filled 2018-06-23: qty 10

## 2018-06-23 MED ORDER — TRASTUZUMAB CHEMO 150 MG IV SOLR
6.0000 mg/kg | Freq: Once | INTRAVENOUS | Status: AC
  Administered 2018-06-23: 525 mg via INTRAVENOUS
  Filled 2018-06-23: qty 25

## 2018-06-23 MED ORDER — HEPARIN SOD (PORK) LOCK FLUSH 100 UNIT/ML IV SOLN
500.0000 [IU] | Freq: Once | INTRAVENOUS | Status: AC | PRN
  Administered 2018-06-23: 500 [IU]
  Filled 2018-06-23: qty 5

## 2018-06-23 MED ORDER — ACETAMINOPHEN 325 MG PO TABS
650.0000 mg | ORAL_TABLET | Freq: Once | ORAL | Status: AC
  Administered 2018-06-23: 650 mg via ORAL

## 2018-06-23 MED ORDER — SODIUM CHLORIDE 0.9 % IV SOLN
Freq: Once | INTRAVENOUS | Status: AC
  Filled 2018-06-23: qty 250

## 2018-06-23 MED ORDER — ACETAMINOPHEN 325 MG PO TABS
ORAL_TABLET | ORAL | Status: AC
  Filled 2018-06-23: qty 2

## 2018-06-23 MED ORDER — DIPHENHYDRAMINE HCL 25 MG PO CAPS
50.0000 mg | ORAL_CAPSULE | Freq: Once | ORAL | Status: AC
  Administered 2018-06-23: 50 mg via ORAL

## 2018-06-23 MED ORDER — SODIUM CHLORIDE 0.9 % IV SOLN
200.0000 mg | Freq: Once | INTRAVENOUS | Status: AC
  Administered 2018-06-23: 200 mg via INTRAVENOUS
  Filled 2018-06-23: qty 8

## 2018-06-23 NOTE — Progress Notes (Signed)
Per [name]: OK to treat with ALT of 105 and other labs from 06/21/18

## 2018-06-23 NOTE — Patient Instructions (Signed)
[facility] Discharge Instructions for Patients Receiving Chemotherapy  Today you received the following chemotherapy agents: Pembrolizumab (Keytruda) and Trastuzumab (Herceptin)  To help prevent nausea and vomiting after your treatment, we encourage you to take your nausea medication as directed.    If you develop nausea and vomiting that is not controlled by your nausea medication, call the clinic.   BELOW ARE SYMPTOMS THAT SHOULD BE REPORTED IMMEDIATELY:  *FEVER GREATER THAN 100.5 F  *CHILLS WITH OR WITHOUT FEVER  NAUSEA AND VOMITING THAT IS NOT CONTROLLED WITH YOUR NAUSEA MEDICATION  *UNUSUAL SHORTNESS OF BREATH  *UNUSUAL BRUISING OR BLEEDING  TENDERNESS IN MOUTH AND THROAT WITH OR WITHOUT PRESENCE OF ULCERS  *URINARY PROBLEMS  *BOWEL PROBLEMS  UNUSUAL RASH Items with * indicate a potential emergency and should be followed up as soon as possible.  Feel free to call the clinic should you have any questions or concerns. The clinic phone number is [phone].  Please show the [facility] at check-in to the Emergency Department and triage nurse.

## 2018-06-29 ENCOUNTER — Other Ambulatory Visit: Payer: Self-pay | Admitting: Family Medicine

## 2018-06-30 ENCOUNTER — Other Ambulatory Visit: Payer: Self-pay

## 2018-06-30 MED ORDER — OLMESARTAN MEDOXOMIL-HCTZ 20-12.5 MG PO TABS: 1.0000 | ORAL_TABLET | Freq: Every day | ORAL | 2 refills | Status: DC

## 2018-06-30 MED FILL — OLMESARTAN-HCTZ 20-12.5 MG: 20-12.5 | 90 days supply | Qty: 90 | Fill #0

## 2018-07-06 ENCOUNTER — Telehealth: Payer: Self-pay | Admitting: *Deleted

## 2018-07-06 NOTE — Telephone Encounter (Signed)
"  Dawn [name] [phone]).  My employer needs documentation of the flu vaccine I received there with my treatment.  Could my vaccine information be faxed to my employer?"

## 2018-07-12 NOTE — Progress Notes (Signed)
error 

## 2018-07-13 MED FILL — ANASTROZOLE 1 MG TABLET: 1 | 30 days supply | Qty: 30 | Fill #2

## 2018-07-14 ENCOUNTER — Inpatient Hospital Stay: Payer: Medicare Other

## 2018-07-14 ENCOUNTER — Encounter: Payer: Self-pay | Admitting: Nurse Practitioner

## 2018-07-14 ENCOUNTER — Inpatient Hospital Stay (HOSPITAL_BASED_OUTPATIENT_CLINIC_OR_DEPARTMENT_OTHER): Payer: Medicare Other | Admitting: Nurse Practitioner

## 2018-07-14 VITALS — BP 135/79 | HR 69 | Temp 98.5°F | Resp 18 | Ht 63.0 in | Wt 185.6 lb

## 2018-07-14 DIAGNOSIS — Z170 Estrogen receptor positive status [ER+]: Secondary | ICD-10-CM

## 2018-07-14 DIAGNOSIS — E669 Obesity, unspecified: Secondary | ICD-10-CM | POA: Diagnosis not present

## 2018-07-14 DIAGNOSIS — Z923 Personal history of irradiation: Secondary | ICD-10-CM

## 2018-07-14 DIAGNOSIS — C541 Malignant neoplasm of endometrium: Secondary | ICD-10-CM

## 2018-07-14 DIAGNOSIS — G620 Drug-induced polyneuropathy: Secondary | ICD-10-CM | POA: Diagnosis not present

## 2018-07-14 DIAGNOSIS — Z95828 Presence of other vascular implants and grafts: Secondary | ICD-10-CM

## 2018-07-14 DIAGNOSIS — C775 Secondary and unspecified malignant neoplasm of intrapelvic lymph nodes: Secondary | ICD-10-CM

## 2018-07-14 DIAGNOSIS — C50211 Malignant neoplasm of upper-inner quadrant of right female breast: Secondary | ICD-10-CM | POA: Diagnosis not present

## 2018-07-14 DIAGNOSIS — Z5112 Encounter for antineoplastic immunotherapy: Secondary | ICD-10-CM | POA: Diagnosis not present

## 2018-07-14 DIAGNOSIS — I10 Essential (primary) hypertension: Secondary | ICD-10-CM | POA: Diagnosis not present

## 2018-07-14 DIAGNOSIS — D696 Thrombocytopenia, unspecified: Secondary | ICD-10-CM | POA: Diagnosis not present

## 2018-07-14 LAB — CBC WITH DIFFERENTIAL/PLATELET
ABS IMMATURE GRANULOCYTES: 0.01 10*3/uL (ref 0.00–0.07)
BASOS ABS: 0 10*3/uL (ref 0.0–0.1)
Basophils Relative: 1 %
EOS PCT: 2 %
Eosinophils Absolute: 0.1 10*3/uL (ref 0.0–0.5)
HCT: 37.5 % (ref 36.0–46.0)
HEMOGLOBIN: 13.1 g/dL (ref 12.0–15.0)
IMMATURE GRANULOCYTES: 0 %
Lymphocytes Relative: 49 %
Lymphs Abs: 1.7 10*3/uL (ref 0.7–4.0)
MCH: 35 pg — ABNORMAL HIGH (ref 26.0–34.0)
MCHC: 34.9 g/dL (ref 30.0–36.0)
MCV: 100.3 fL — ABNORMAL HIGH (ref 80.0–100.0)
Monocytes Absolute: 0.4 10*3/uL (ref 0.1–1.0)
Monocytes Relative: 12 %
NEUTROS ABS: 1.3 10*3/uL — AB (ref 1.7–7.7)
NEUTROS PCT: 36 %
Platelets: 136 10*3/uL — ABNORMAL LOW (ref 150–400)
RBC: 3.74 MIL/uL — ABNORMAL LOW (ref 3.87–5.11)
RDW: 11.9 % (ref 11.5–15.5)
WBC: 3.5 10*3/uL — AB (ref 4.0–10.5)
nRBC: 0 % (ref 0.0–0.2)

## 2018-07-14 LAB — COMPREHENSIVE METABOLIC PANEL
ALBUMIN: 3.7 g/dL (ref 3.5–5.0)
ALK PHOS: 41 U/L (ref 38–126)
ALT: 118 U/L — ABNORMAL HIGH (ref 0–44)
AST: 78 U/L — ABNORMAL HIGH (ref 15–41)
Anion gap: 7 (ref 5–15)
BILIRUBIN TOTAL: 0.8 mg/dL (ref 0.3–1.2)
BUN: 12 mg/dL (ref 8–23)
CALCIUM: 9.3 mg/dL (ref 8.9–10.3)
CO2: 29 mmol/L (ref 22–32)
Chloride: 101 mmol/L (ref 98–111)
Creatinine, Ser: 0.79 mg/dL (ref 0.44–1.00)
Glucose, Bld: 109 mg/dL — ABNORMAL HIGH (ref 70–99)
POTASSIUM: 3.6 mmol/L (ref 3.5–5.1)
Sodium: 137 mmol/L (ref 135–145)
TOTAL PROTEIN: 6.8 g/dL (ref 6.5–8.1)

## 2018-07-14 LAB — TSH: TSH: 2.835 u[IU]/mL (ref 0.308–3.960)

## 2018-07-14 MED ORDER — SODIUM CHLORIDE 0.9 % IV SOLN
Freq: Once | INTRAVENOUS | Status: AC
  Administered 2018-07-14: 14:00:00 via INTRAVENOUS
  Filled 2018-07-14: qty 250

## 2018-07-14 MED ORDER — SODIUM CHLORIDE 0.9% FLUSH
10.0000 mL | INTRAVENOUS | Status: DC | PRN
  Administered 2018-07-14: 10 mL
  Filled 2018-07-14: qty 10

## 2018-07-14 MED ORDER — SODIUM CHLORIDE 0.9% FLUSH
10.0000 mL | INTRAVENOUS | Status: DC | PRN
  Administered 2018-07-14: 10 mL via INTRAVENOUS
  Filled 2018-07-14: qty 10

## 2018-07-14 MED ORDER — HEPARIN SOD (PORK) LOCK FLUSH 100 UNIT/ML IV SOLN
500.0000 [IU] | Freq: Once | INTRAVENOUS | Status: AC | PRN
  Administered 2018-07-14: 500 [IU]
  Filled 2018-07-14: qty 5

## 2018-07-14 MED ORDER — SODIUM CHLORIDE 0.9 % IV SOLN
200.0000 mg | Freq: Once | INTRAVENOUS | Status: AC
  Administered 2018-07-14: 200 mg via INTRAVENOUS
  Filled 2018-07-14: qty 8

## 2018-07-14 NOTE — Progress Notes (Signed)
Ok to treat with CBC and CMP per Cira Rue, NP

## 2018-07-14 NOTE — Progress Notes (Signed)
[place]  [phone] [phone]  Clinic Follow up Note   Patient Care Team: [name], MD as PCP - Haskell Riling, MD as Consulting Physician (General Surgery) Truitt Merle, MD as Consulting Physician (Hematology) [name], MD as Attending Physician (Radiation Oncology) 07/14/2018  SUMMARY OF ONCOLOGIC HISTORY: Oncology History   MSI high     Malignant neoplasm of upper-inner quadrant of right breast in female, estrogen receptor positive ([place])   04/27/2017 Initial Biopsy    Diagnosis Breast, right, needle core biopsy INVASIVE DUCTAL CARCINOMA, GRADE 2 Microscopic Comment The neoplasm has intracellular mucin and signet ring cell features. Immunostains shows these cells are positive for ER,GATA3, ck7 and GCDFP, negative for ck20, cdx2, TTF-1 and pax8, The immunostaining pattern supports the neoplasm is breast primary.    04/27/2017 Receptors her2    Estrogen Receptor: 70%, POSITIVE, MODERATE STAINING INTENSITY Progesterone Receptor: 0%, NEGATIVE Proliferation Marker Ki67: 30% HER2 - **POSITIVE** RATIO OF HER2/CEP17 SIGNALS 2.91 AVERAGE HER2 COPY NUMBER PER CELL 7.28    04/27/2017 Initial Diagnosis    Malignant neoplasm of upper-inner quadrant of right breast in female, estrogen receptor positive ([place])    05/07/2017 Imaging    CT Chest W Contrast 05/07/17 IMPRESSION: Tiny well-defined fatty lesion on the pleura at the right lung base. The thinner slice collimation used for today's chest CT eliminates volume-averaging seen in the lesion on the prior exam and confirms that this is a diffusely fatty nodule. This is a benign finding and likely represents a tiny lipoma. No defect in the hemidiaphragm evident to suggest tiny diaphragmatic hernia. Pulmonary hamartoma a consideration although the lack of soft tissue components makes this less likely.    05/15/2017 Genetic Testing    Patient had genetic testing due to  a personal history of breast cancer and uterine cancer as well as a family history of cancer. The Multi-Cancer panel was ordered. The Multi-Cancer Panel offered by [facility] includes sequencing and/or deletion duplication testing of the following 83 genes: ALK, APC, ATM, AXIN2,BAP1,  BARD1, BLM, BMPR1A, BRCA1, BRCA2, BRIP1, CASR, CDC73, CDH1, CDK4, CDKN1B, CDKN1C, CDKN2A (p14ARF), CDKN2A (p16INK4a), CEBPA, CHEK2, CTNNA1, DICER1, DIS3L2, EGFR (c.2369C>T, p.Thr790Met variant only), EPCAM (Deletion/duplication testing only), FH, FLCN, GATA2, GPC3, GREM1 (Promoter region deletion/duplication testing only), HOXB13 (c.251G>A, p.Gly84Glu), HRAS, KIT, MAX, MEN1, MET, MITF (c.952G>A, p.Glu318Lys variant only), MLH1, MSH2, MSH3, MSH6, MUTYH, NBN, NF1, NF2, NTHL1, PALB2, PDGFRA, PHOX2B, PMS2, POLD1, POLE, POT1, PRKAR1A, PTCH1, PTEN, RAD50, RAD51C, RAD51D, RB1, RECQL4, RET, RUNX1, SDHAF2, SDHA (sequence changes only), SDHB, SDHC, SDHD, SMAD4, SMARCA4, SMARCB1, SMARCE1, STK11, SUFU, TERC, TERT, TMEM127, TP53, TSC1, TSC2, VHL, WRN and WT1.   Results: No pathogenic mutations were identified. A VUS in the GATA2 gene c.1348G>A (p.Gly450Arg) was identified.  The date of this test report is 05/25/2017.      05/25/2017 Surgery    RIGHT BREAST LUMPECTOMY WITH RADIOACTIVE SEED AND  RIGHT AXILLARY SENTINEL LYMPH NODE BIOPSY and Pot placement by Dr. Excell Seltzer 05/25/17    05/25/2017 Pathology Results    Diagnosis 05/25/17 1. Breast, lumpectomy, right - INVASIVE DUCTAL CARCINOMA, GRADE II/III, SPANNING 2.2 CM. - DUCTAL CARCINOMA IN SITU, HIGH GRADE. - THE SURGICAL RESECTION MARGINS ARE NEGATIVE FOR CARCINOMA. - SEE ONCOLOGY TABLE BELOW. 2. Breast, excision, right additional superior margin - BENIGN FIBROADIPOSE TISSUE. - BENIGN SKELETAL MUSCLE. - SEE COMMENT. 3. Breast, excision, right additional lateral margin - BENIGN BREAST PARENCHYMA. - THERE IS NO EVIDENCE OF MALIGNANCY. - SEE COMMENT. 4. Breast, excision, chest  wall  margin - BENIGN FIBROADIPOSE TISSUE. - THERE IS NO EVIDENCE OF MALIGNANCY. - SEE COMMENT. 5. Lymph node, sentinel, biopsy, right axillary - THERE IS NO EVIDENCE OF CARCINOMA IN 1 OF 1 LYMPH NODE (0/1). 6. Lymph node, sentinel, biopsy, right axillary - THERE IS NO EVIDENCE OF CARCINOMA IN 1 OF 1 LYMPH NODE (0/1). 7. Lymph node, sentinel, biopsy, right axillary - THERE IS NO EVIDENCE OF CARCINOMA IN 1 OF 1 LYMPH NODE (0/1).    06/26/2017 PET scan    PET  IMPRESSION: 1. Postoperative findings both in the right breast and in the anatomic pelvis, with associated low-grade activity considered to be postoperative in nature. No hypermetabolic adenopathy or hypermetabolic lesions are identified to suggest active metastatic disease/malignancy. 2. Other imaging findings of potential clinical significance: Aortic Atherosclerosis (ICD10-I70.0). Sigmoid colon diverticulosis. Biapical pleuroparenchymal scarring in the lungs. Small amount of free pelvic fluid in the cul-de-sac, likely postoperative.     07/01/2017 - 10/14/2017 Chemotherapy    Adjuvant TCH with Onpro every 3 weeks for 6 cycles starting on 07/01/17, Changed Taxol to abraxane with cycle 4 due to drug rash reaction, followed by Herceptin every 3 weeks for 6 months     11/01/2017 - 12/08/2017 Radiation Therapy    Radiation therapy to her right breast with [name]    11/04/2017 -  Chemotherapy    Maintenance Herceptin starting 11/04/17 and will comeplete her 12 months in 06/2018     12/23/2017 -  Anti-estrogen oral therapy    Adjuvant letrozole 2.5 mg daily started 12/23/17, switched to exemestane on 04/21/18 due to joint pain. Could not afford aromasin, started anastrozole 04/2018.     12/30/2017 Imaging    CT CAP W Contrast 12/30/17 IMPRESSION: Increased size of 1.1 cm aorto-caval retroperitoneal lymph node. This could be reactive due to interval hysterectomy, however metastatic carcinoma cannot be excluded. Consider continued  attention on short-term follow-up CT, or PET-CT scan for further evaluation.  Mild hepatic steatosis.    01/25/2018 PET scan    IMPRESSION: 1. Enlarging hypermetabolic aortocaval lymph node is worrisome for metastatic disease. 2. Probable postoperative seroma in the medial right breast, with associated mild inflammatory hypermetabolism.    04/28/2018 Mammogram    Probably benign. Calcifications in the right breast most likely are dystrophic, related to previous surgery, and are probably benign.     04/28/2018 Imaging    DEXA Scan T Score -2.0    06/10/2018 Echocardiogram    06/10/2018 ECHO LV EF: 55% -   60%     Endometrial adenocarcinoma ([place])   05/07/2017 Initial Diagnosis    Endometrial adenocarcinoma ([place])    06/09/2017 Surgery    XI ROBOTIC ASSISTED TOTAL LAPOROSCOPIC HYSTERECTOMY WITH BILATERAL SALPINGO OOPHORECTOMY and SENTINEL NODE BIOPSY by Dr. Denman George 06/09/17    06/09/2017 Pathology Results    Diagnosis 06/09/17 1. Lymph node, sentinel, biopsy, right external iliac - METASTATIC ADENOCARCINOMA IN ONE LYMPH NODE (1/1). 2. Lymph node, sentinel, biopsy, left obturator - ONE BENIGN LYMPH NODE (0/1). 3. Lymph node, sentinel, biopsy, left external iliac - METASTATIC ADENOCARCINOMA IN ONE LYMPH NODE (1/1). 4. Uterus +/- tubes/ovaries, neoplastic ENDOMETRIUM: - ENDOMETRIAL ADENOCARCINOMA, 3.2 CM. - CARCINOMA INVADES INNER HALF OF MYOMETRIUM. 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 - CERVIX, BILATERAL FALLOPIAN TUBES AND BILATERAL OVARIES FREE OF TUMOR    08/18/2017 - 09/17/2017 Radiation Therapy    vaginal brachytherapy per [name] on starting 08/18/17     Genetic Testing    Patient has genetic testing done  for MSI. 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     Endometrial cancer ([place])   06/09/2017 Initial Diagnosis    Endometrial cancer ([place])    02/16/2018 -  Chemotherapy    The patient had pembrolizumab (KEYTRUDA) 200 mg in sodium chloride  0.9 % 50 mL chemo infusion, 200 mg, Intravenous, Once, 8 of 11 cycles Administration: 200 mg (02/17/2018), 200 mg (03/10/2018), 200 mg (03/31/2018), 200 mg (04/21/2018), 200 mg (06/02/2018), 200 mg (05/12/2018), 200 mg (06/23/2018), 200 mg (07/14/2018)  for chemotherapy treatment.    CURRENT THERAPY:  -Maintenance Herceptin starting 11/04/17 and completed 12 months on 06/23/18  -Adjuvant letrozole 2.5 mg daily started 12/23/17, switched to exemestane on 04/21/18 due to joint pain. Could not afford aromasin, started anastrozole 04/2018. -Keytruda every 21 days started on 02/17/2018 for recurrent endometrial cancer  INTERVAL HISTORY: Ms. Gleed returns for follow up and next cycle as scheduled. She received last cycle herceptin on 10/9 and another cycle of [place]. She is doing well. Working 2 days per week. Appetite is normal. Denies n/v/c/d. She continues to have skin itching and irregular red rash on her arms. She uses triamcinolone and cetaphil topically. She takes benadryl as needed for itching. She has occasional dry cough, no fever, chills, chest pain, or dyspnea. There are sick contacts at work. 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 She takes anastrozole routinely, intermittent mild joint pain is stable and tolerable. She exercises by walking.    MEDICAL HISTORY:  Past Medical History:  Diagnosis Date  . Anemia    as a child.  . Arthritis   . Bilateral cataracts   . Bilateral leg cramps   . Cancer ([place])    skin  . Dyspnea   . Family history of bladder cancer   . Family history of colon cancer in father   . Fuchs' corneal dystrophy   . GERD (gastroesophageal reflux disease)   . Hearing loss    Right side 30%  . History of hiatal hernia    small size  . History of radiation therapy 08/21/17, 08/28/17,09/01/17, 09/11/17, 09/17/17   Vaginal cuff brachytherapy.   Marland Kitchen History of radiation therapy 11/11/17- 12/08/17   Right Breast treated to 40.05 Gy with 15 fx of 2.67 Gy and a boost  of 10 Gy with 5 fx.   . Hyperlipidemia   . Hypertension   . IBS (irritable bowel syndrome)    hx of  . Malignant neoplasm of upper-inner quadrant of right female breast ([place]) 04/2017  . NAFL (nonalcoholic fatty liver)   . Obesity   . PONV (postoperative nausea and vomiting)   . 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 Uterine cancer ([place]) 04/2017   endometrial cancer  . Varices, gastric   . Vertigo     SURGICAL HISTORY: Past Surgical History:  Procedure Laterality Date  . BREAST BIOPSY Right 04/27/2017  . BREAST LUMPECTOMY WITH RADIOACTIVE SEED AND SENTINEL LYMPH NODE BIOPSY Right 05/25/2017   Procedure: RIGHT BREAST LUMPECTOMY WITH RADIOACTIVE SEED AND  RIGHT AXILLARY SENTINEL LYMPH NODE BIOPSY;  Surgeon: Excell Seltzer, MD;  Location: [facility];  Service: General;  Laterality: Right;  . CATARACT EXTRACTION, BILATERAL    . COLONOSCOPY    . HYSTEROSCOPY W/D&C N/A 04/29/2017   Procedure: DILATATION AND CURETTAGE /HYSTEROSCOPY;  Surgeon: Linda Hedges, DO;  Location: [facility];  Service: Gynecology;  Laterality: N/A;  . PORTACATH PLACEMENT Left 05/25/2017  Procedure: INSERTION PORT-A-CATH;  Surgeon: Excell Seltzer, MD;  Location: [facility];  Service: General;  Laterality: Left;  . ROBOTIC ASSISTED TOTAL HYSTERECTOMY WITH BILATERAL SALPINGO OOPHERECTOMY N/A 06/09/2017   Procedure: XI ROBOTIC ASSISTED TOTAL LAPOROSCOPIC HYSTERECTOMY WITH BILATERAL SALPINGO OOPHORECTOMY;  Surgeon: Everitt Amber, MD;  Location: [facility];  Service: Gynecology;  Laterality: N/A;  . SENTINEL NODE BIOPSY N/A 06/09/2017   Procedure: SENTINEL NODE BIOPSY;  Surgeon: Everitt Amber, MD;  Location: [facility];  Service: Gynecology;  Laterality: N/A;    I have reviewed the social history and family history with the patient and they are unchanged from previous note.  ALLERGIES:  is allergic to ciprofloxacin; crestor [rosuvastatin calcium]; fosamax [alendronate  sodium]; penicillins; and taxol [paclitaxel].  MEDICATIONS:  Current Outpatient Medications  Medication Sig Dispense Refill  . anastrozole (ARIMIDEX) 1 MG tablet Take 1 tablet (1 mg total) by mouth daily. 30 tablet 3  . Ascorbic Acid (VITAMIN C) 1000 MG tablet Take 1,000 mg by mouth daily.    Marland Kitchen b complex vitamins capsule Take 1 capsule by mouth daily.    . Calcium Carbonate-Vitamin D3 (CALCIUM 600-D) 600-400 MG-UNIT TABS Take 1-2 tablets by mouth daily.    . diphenhydrAMINE (BENADRYL) 25 MG tablet Take 25-50 mg by mouth every 6 (six) hours as needed for sleep.    Marland Kitchen ibandronate (BONIVA) 150 MG tablet Take in the morning, once a month,  with a full glass of water,on an empty stomach,do not take anything else by mouth or lie down for the next 30 min. 3 tablet 3  . Multiple Vitamin (MULTIVITAMIN) capsule Take 1 capsule by mouth daily.      Marland Kitchen olmesartan-hydrochlorothiazide (BENICAR HCT) 20-12.5 MG tablet Take 1 tablet by mouth daily. 90 tablet 2  . triamcinolone cream (KENALOG) 0.1 % APPLY TO ITCHY SPOTS ONCE DAILY  1  . acetaminophen (TYLENOL) 500 MG tablet Take 500 mg by mouth every 6 (six) hours as needed for moderate pain.    Marland Kitchen lidocaine-prilocaine (EMLA) cream   2  . meclizine (ANTIVERT) 25 MG tablet Take 1-2 tablets (25-50 mg total) by mouth 3 (three) times daily as needed for dizziness. (Patient taking differently: Take 25-50 mg by mouth 3 (three) times daily as needed for dizziness (depends on dizziness if takes 1-2 tablets). ) 30 tablet 0  . Pitavastatin Calcium (LIVALO) 2 MG TABS Take 1.5 tablets (3 mg total) by mouth every evening. Takes 1.5 tablet 135 tablet 3  . prednisoLONE acetate (PRED FORTE) 1 % ophthalmic suspension Place 1 drop into the left eye 3 (three) times daily.    . sodium chloride (MURO 128) 5 % ophthalmic solution Place 2 drops into both eyes daily.     No current facility-administered medications for this visit.    Facility-Administered Medications Ordered in Other  Visits  Medication Dose Route Frequency Provider Last Rate Last Dose  . sodium chloride flush (NS) 0.9 % injection 10 mL  10 mL Intracatheter PRN Truitt Merle, MD   10 mL at 07/14/18 1512    PHYSICAL EXAMINATION: ECOG PERFORMANCE STATUS: 1 - Symptomatic but completely ambulatory  Vitals:   07/14/18 1133  BP: 135/79  Pulse: 69  Resp: 18  Temp: 98.5 F (36.9 C)  SpO2: 100%   Filed Weights   07/14/18 1133  Weight: 185 lb 9.6 oz (84.2 kg)    GENERAL:alert, no distress and comfortable SKIN: skin color, texture, turgor are normal, few small scattered flat irregular erythematous areas on left arm EYES:  sclera clear OROPHARYNX:no thrush or ulcers LYMPH:  no palpable cervical, supraclavicular, or axillary lymphadenopathy LUNGS: clear to auscultation with normal breathing effort HEART: regular rate & rhythm, no lower extremity edema ABDOMEN: abdomen soft, non-tender and normal bowel sounds Musculoskeletal:no cyanosis of digits and no clubbing  NEURO: alert & oriented x 3 with fluent speech, no focal motor/sensory deficits Breast exam: without palpable mass in either breast or axilla. Incisions are well healed  PAC without erythema    LABORATORY DATA:  I have reviewed the data as listed CBC Latest Ref Rng & Units 07/14/2018 06/21/2018 06/02/2018  WBC 4.0 - 10.5 K/uL 3.5(L) 4.3 4.1  Hemoglobin 12.0 - 15.0 g/dL 13.1 13.4 13.4  Hematocrit 36.0 - 46.0 % 37.5 38.0 38.3  Platelets 150 - 400 K/uL 136(L) 167 147     CMP Latest Ref Rng & Units 07/14/2018 06/21/2018 06/02/2018  Glucose 70 - 99 mg/dL 109(H) 100(H) 130(H)  BUN 8 - 23 mg/dL '12 10 11  '$ Creatinine 0.44 - 1.00 mg/dL 0.79 0.79 0.83  Sodium 135 - 145 mmol/L 137 135 138  Potassium 3.5 - 5.1 mmol/L 3.6 3.6 3.4(L)  Chloride 98 - 111 mmol/L 101 98 102  CO2 22 - 32 mmol/L '29 29 28  '$ Calcium 8.9 - 10.3 mg/dL 9.3 9.3 9.4  Total Protein 6.5 - 8.1 g/dL 6.8 7.5 7.3  Total Bilirubin 0.3 - 1.2 mg/dL 0.8 0.6 0.6  Alkaline Phos 38 - 126 U/L 41  53 49  AST 15 - 41 U/L 78(H) 60(H) 62(H)  ALT 0 - 44 U/L 118(H) 105(H) 108(H)   PATHOLOGY FINDINGS:  Diagnosis 06/09/17 1. Lymph node, sentinel, biopsy, right external iliac - METASTATIC ADENOCARCINOMA IN ONE LYMPH NODE (1/1). 2. Lymph node, sentinel, biopsy, left obturator - ONE BENIGN LYMPH NODE (0/1). 3. Lymph node, sentinel, biopsy, left external iliac - METASTATIC ADENOCARCINOMA IN ONE LYMPH NODE (1/1). 4. Uterus +/- tubes/ovaries, neoplastic ENDOMETRIUM: - ENDOMETRIAL ADENOCARCINOMA, 3.2 CM. - CARCINOMA INVADES INNER HALF OF MYOMETRIUM. 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 - CERVIX, BILATERAL FALLOPIAN TUBES AND BILATERAL OVARIES FREE OF TUMOR. Microscopic Comment 4. ONCOLOGY TABLE-UTERUS, CARCINOMA OR CARCINOSARCOMA Specimen: Uterus with bilateral fallopian tubes and ovaries, right and left external iliac nodes and left obturator lymph node. Procedure: Hysterectomy with bilateral salpingo-oophorectomy and lymph node biopsies. Lymph node sampling performed: Yes. Specimen integrity: Intact. Maximum tumor size: 3.2 cm. Histologic type: Mixed, endometrioid, clear cell and serous. Grade: III. Myometrial invasion: 0.8 cm where myometrium is 2.2 cm in thickness. Cervical stromal involvement: No. Extent of involvement of other organs: N/A. Lymph - vascular invasion: Present. Peritoneal washings: N/A. Lymph nodes: Examined: 3 Sentinel 0 Non-sentinel 3 Total Lymph nodes with metastasis: 2. Isolated tumor cells (< 0.2 mm): 0. Micrometastasis: (> 0.2 mm and < 2.0 mm): 0. Macrometastasis: (> 2.0 mm): 2. Extracapsular extension: No. TNM code: pT1a, pN1. FIGO Stage (based on pathologic findings, needs clinical correlation): IIIC1. Comments: The tumor is 3.2 cm in greatest dimension and invades the myometrium to a depth of 0.8 cm where the myometrium is 2.2 cm in thickness and there is lymphatic vascular involvement by tumor. The tumor is a mixed endometrioid, clear cell  and serous carcinoma. (JDP:ah 06/10/17)   Diagnosis 05/25/17 1. Breast, lumpectomy, right - INVASIVE DUCTAL CARCINOMA, GRADE II/III, SPANNING 2.2 CM. - DUCTAL CARCINOMA IN SITU, HIGH GRADE. - THE SURGICAL RESECTION MARGINS ARE NEGATIVE FOR CARCINOMA. - SEE ONCOLOGY TABLE BELOW. 2. Breast, excision, right additional superior margin - BENIGN FIBROADIPOSE TISSUE. - BENIGN SKELETAL  MUSCLE. - SEE COMMENT. 3. Breast, excision, right additional lateral margin - BENIGN BREAST PARENCHYMA. - THERE IS NO EVIDENCE OF MALIGNANCY. - SEE COMMENT. 4. Breast, excision, chest wall margin - BENIGN FIBROADIPOSE TISSUE. - THERE IS NO EVIDENCE OF MALIGNANCY. - SEE COMMENT. 5. Lymph node, sentinel, biopsy, right axillary - THERE IS NO EVIDENCE OF CARCINOMA IN 1 OF 1 LYMPH NODE (0/1). 6. Lymph node, sentinel, biopsy, right axillary - THERE IS NO EVIDENCE OF CARCINOMA IN 1 OF 1 LYMPH NODE (0/1). 7. Lymph node, sentinel, biopsy, right axillary - THERE IS NO EVIDENCE OF CARCINOMA IN 1 OF 1 LYMPH NODE (0/1). Microscopic Comment 1. BREAST, INVASIVE TUMOR Procedure: Seed localized lumpectomy, additional superior, lateral, and chest wall margin resections and axillary lymph node resections. Laterality: Right. Tumor Size: 2.2 cm (gross measurement). Histologic Type: Ductal. Microscopic Comment(continued) Grade: II. Tubular Differentiation: 2. Nuclear Pleomorphism: 2. Mitotic Count: 2. Ductal Carcinoma in Situ (DCIS): Present, high grade. Extent of Tumor: Confined to breast parenchyma. Margins: Greater than or equal to 0.2 cm to all margins. Regional Lymph Nodes: Number of Lymph Nodes Examined: 3. Number of Sentinel Lymph Nodes Examined: 3. Lymph Nodes with Macrometastases: 0. Lymph Nodes with Micrometastases: 0. Lymph Nodes with Isolated Tumor Cells: 0. Breast Prognostic Profile: Case [id number]. Estrogen Receptor: 70%, moderate. Progesterone Receptor: 0%. Her2: Amplification was detected. The  ratio was 2.91. Ki-67: 30%. Best tumor block for sendout testing: 1A-1C. Pathologic Stage Classification (pTNM, AJCC 8th Edition): Primary Tumor (pT): pT2. Regional Lymph Nodes (pN): pN0. Distant Metastases (pM): pMX. 2. - 4. The surgical resection margin(s) of the specimen were inked and microscopically evaluated.  Diagnosis 04/29/2017 Endometrium, curettage - ADENOCARCINOMA. - SEE MICROSCOPIC DESCRIPTION. Microscopic Comment There are fragments of adenocarcinoma, some of which have cribriform architecture, and there is focal extracellular mucin. Immunohistochemistry shows the tumor is positive with estrogen receptor, progesterone receptor with patchy positivity with carcinoembryonic antigen and p16. The tumor is negative with CD10 and vimentin. The morphology and immunophenotype are not definitive as to endocervical or endometrial origin. There are rare fragments with endometrial type stroma, which may indicate an endometrial origin although this is not definitive as to origin.  Diagnosis 04/27/2017 Breast, right, needle core biopsy INVASIVE DUCTAL CARCINOMA, GRADE 2 Microscopic Comment The neoplasm has intracellular mucin and signet ring cell features. Immunostains shows these cells are positive for ER, GATA3, ck7 and GCDFP, negative for ck20, cdx2, TTF-1 and pax8, The immunostaining pattern supports the neoplasm is breast primary. The Breast prognostic profile has been ordered. Results: IMMUNOHISTOCHEMICAL AND MORPHOMETRIC ANALYSIS PERFORMED MANUALLY Estrogen Receptor: 70%, POSITIVE, MODERATE STAINING INTENSITY Progesterone Receptor: 0%, NEGATIVE Proliferation Marker Ki67: 30% Results: HER2 - **POSITIVE** RATIO OF HER2/CEP17 SIGNALS 2.91 AVERAGE HER2 COPY NUMBER PER CELL 7.28  GENETICS 05/07/17   PROCEDURES  06/10/2018 ECHO LV EF: 55% - 60%    RADIOGRAPHIC STUDIES: I have personally reviewed the radiological images as listed and agreed with the findings in the  report. No results found.   ASSESSMENT & PLAN: 67 y.o. 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 Malignant neoplasm of upper inner quadrant of right breast , Invasive ductal carcinoma, pT2N0M0, G2,  ER+/PR-/HER2+ -She underwent right breast lumpectomy with sentinel lymph node biopsy with Dr. Excell Seltzer on 05/25/2017 with port placement -Pathology confirmed invasive ductal carcinoma, grade 2, spanning 2.2 cm, margins were clear, no positive lymph nodes, ER positive, PR negative, HER-2 positive. I reviewed this with the  patient previously -We previously discussed the risk of cancer recurrence after completed surgical resection. Due to the HER-2 positive disease, she is at moderate to high risk. [name] recommended adjuvant chemotherapy at that time. The regiment was selected to cover for endometrial cancer also. -She completed adjuvant TCH every 3 weeks with Onpro from 07/01/17-10/14/17. Changed Taxol to abraxane with cycle 4 due to drug rash reaction. She tolerated relatively well besides the rash and Grade 1 neuropathy. -She completed Herceptin maintenance therapy, from 11/04/17 - 06/23/18. -She completed adjuvant radiation therapy on her breast on 11/01/17-12/08/17 with [name]. -She started Letrozole on 12/23/17. Given her new onset joint pain and stiffness she was switched to exemestane on 04/21/18 but co-pay was too costly. She was therefore switched to Anastrozole on 04/2018. -[name] previously discussed the role of adjuvant Neratinib, which is approved for HER-2 positive early stage breast cancer, especially for positive disease, to potentially decrease cancer recurrence by 3 to 4%, and reviewed the side effects including diarrhea, fatigue, abnormal liver functions, etc. and advised her to use imodium as needed. MD discussed that the risk of heart failure is lower than Herceptin, but will likely order an echocardiogram  after she completes a year treatment.  She was interested and printed material was given; she ultimately declines treatment today, stating overall benefit is not robust and potential side effects may hinder her QOL. She also has high co-pay, to which I informed her of drug assistance program.  -will continue to monitor her carefully, continue anastrozole and routine mammogram; clinical breast exam today is unremarkable.   2. Endometrial adenocarcinoma with lymph node metastasis, pT1apN1M0, FIGO stage IIIc, MSI-H, probable RP node recurrence in 12/2017  -[name] previously reviewed recent restaging CT scan, which showed resolved retroperitoneal adenopathy, no other lesions, she has had excellent response to treatment.  [name] recommends to continue Keytruda every 3 weeks, for up to 2 years. -she continues to tolerate treatment well overall; she will proceed with [place] alone today, and continue q3 weeks  -she continues topical skin regimen for intermittent mild rash. -Labs reviewed, she has mild neutropenia and thrombocytopenia, overall stable and adequate to proceed with treatment today. -Plan to repeat a scan 3-4 months after, in Jan-Feb 2020    3. Genetics -Given her personal history of breast and endometrial cancer, and a family history of malignancy.  -She was previously referred to genetics and results were negative   4. HTN -We monitored her Hypertension while on chemo, she previously held HCTZ on days of chemo -F/u with PCP  5. Transaminitis  -LFTs are chronically elevated due to fatty liver disease -stable overall while on chemo  -Will continue monitoring   6. Bone Health  -previously discussed that AI can potentially weaken the bone.  -She is on monthly boniva, prescribed by her PCP, since 02/2018.  -DEXA scan in 05/2018 revealed T score of -2.0.  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  -She will continue B complex. Previously declined gabapentin -Her  neuropathy is improving slowly.   8. Fatigue  -She currently works part-time 16hr/week  Follow-up: -[place] with Keytruda today, continue q3 weeks  -Patient declined Neratinib  -f/u in 3 weeks with next cycle   All questions were answered. The patient knows to call the clinic with any problems, questions or concerns. No barriers to learning was detected. I spent 20 minutes counseling the patient face to face. The total time spent in the appointment was 25 minutes and  more than 50% was on counseling and review of test results     Alla Feeling, NP 07/14/18

## 2018-07-14 NOTE — Patient Instructions (Signed)
[facility] Discharge Instructions for Patients Receiving Chemotherapy  Today you received the following chemotherapy agents:  Keytruda.  To help prevent nausea and vomiting after your treatment, we encourage you to take your nausea medication as directed.   If you develop nausea and vomiting that is not controlled by your nausea medication, call the clinic.   BELOW ARE SYMPTOMS THAT SHOULD BE REPORTED IMMEDIATELY:  *FEVER GREATER THAN 100.5 F  *CHILLS WITH OR WITHOUT FEVER  NAUSEA AND VOMITING THAT IS NOT CONTROLLED WITH YOUR NAUSEA MEDICATION  *UNUSUAL SHORTNESS OF BREATH  *UNUSUAL BRUISING OR BLEEDING  TENDERNESS IN MOUTH AND THROAT WITH OR WITHOUT PRESENCE OF ULCERS  *URINARY PROBLEMS  *BOWEL PROBLEMS  UNUSUAL RASH Items with * indicate a potential emergency and should be followed up as soon as possible.  Feel free to call the clinic should you have any questions or concerns. The clinic phone number is [phone].  Please show the CHEMO ALERT CARD at check-in to the Emergency Department and triage nurse.    

## 2018-07-15 ENCOUNTER — Telehealth: Payer: Self-pay | Admitting: Hematology

## 2018-07-15 NOTE — Telephone Encounter (Signed)
Appts scheduled patient notified date/time/ letter/calendar mailed per 10/30 los

## 2018-07-16 ENCOUNTER — Inpatient Hospital Stay
Admission: RE | Admit: 2018-07-16 | Discharge: 2018-07-16 | Disposition: A | Discharge status: Home / Self Care | Payer: Self-pay | Source: Ambulatory Visit | Attending: Radiation Oncology | Admitting: Radiation Oncology

## 2018-07-23 ENCOUNTER — Other Ambulatory Visit: Payer: Self-pay

## 2018-07-23 DIAGNOSIS — C50211 Malignant neoplasm of upper-inner quadrant of right female breast: Secondary | ICD-10-CM

## 2018-07-23 DIAGNOSIS — Z170 Estrogen receptor positive status [ER+]: Secondary | ICD-10-CM

## 2018-07-23 MED ORDER — ANASTROZOLE 1 MG PO TABS: 1.0000 mg | ORAL_TABLET | Freq: Every day | ORAL | 3 refills | Status: DC

## 2018-08-03 NOTE — Progress Notes (Signed)
Dawn [name]  Telephone:([phone]) [phone] Fax:([phone]) [phone]  Clinic Follow up Note   Patient Care Team: [name], MD as PCP - Haskell Riling, MD as Consulting Physician (General Surgery) Truitt Merle, MD as Consulting Physician (Hematology) [name], MD as Attending Physician (Radiation Oncology)   Date of Service:  08/04/2018  CHIEF COMPLAINTS:  Follow up right breast cancer and endometrial cancer    Oncology History   MSI high Cancer Staging Endometrial cancer [facility]) Staging form: Corpus Uteri - Adenosarcoma, AJCC 8th Edition - Pathologic stage from 06/09/2017: Stage IIIC (pT1a, pN1, cM0) - Signed by Truitt Merle, MD on 06/16/2017  Malignant neoplasm of upper-inner quadrant of right breast in female, estrogen receptor positive ([place]) Staging form: Breast, AJCC 8th Edition - Clinical stage from 05/06/2017: Stage IA (cT1c, cN0, cM0, G2, ER: Positive, PR: Negative, HER2: Positive) - Unsigned - Pathologic stage from 05/25/2017: Stage IIA (pT2, pN0, cM0, G2, ER: Positive, PR: Negative, HER2: Negative) - Signed by Truitt Merle, MD on 06/16/2017       Malignant neoplasm of upper-inner quadrant of right breast in female, estrogen receptor positive ([place])   04/27/2017 Initial Biopsy    Diagnosis Breast, right, needle core biopsy INVASIVE DUCTAL CARCINOMA, GRADE 2 Microscopic Comment The neoplasm has intracellular mucin and signet ring cell features. Immunostains shows these cells are positive for ER,GATA3, ck7 and GCDFP, negative for ck20, cdx2, TTF-1 and pax8, The immunostaining pattern supports the neoplasm is breast primary.    04/27/2017 Receptors her2    Estrogen Receptor: 70%, POSITIVE, MODERATE STAINING INTENSITY Progesterone Receptor: 0%, NEGATIVE Proliferation Marker Ki67: 30% HER2 - **POSITIVE** RATIO OF HER2/CEP17 SIGNALS 2.91 AVERAGE HER2 COPY NUMBER PER CELL 7.28    04/27/2017 Initial Diagnosis    Malignant neoplasm of upper-inner  quadrant of right breast in female, estrogen receptor positive ([place])    05/07/2017 Imaging    CT Chest W Contrast 05/07/17 IMPRESSION: Tiny well-defined fatty lesion on the pleura at the right lung base. The thinner slice collimation used for today's chest CT eliminates volume-averaging seen in the lesion on the prior exam and confirms that this is a diffusely fatty nodule. This is a benign finding and likely represents a tiny lipoma. No defect in the hemidiaphragm evident to suggest tiny diaphragmatic hernia. Pulmonary hamartoma a consideration although the lack of soft tissue components makes this less likely.    05/15/2017 Genetic Testing    Patient had genetic testing due to a personal history of breast cancer and uterine cancer as well as a family history of cancer. The Multi-Cancer panel was ordered. The Multi-Cancer Panel offered by [facility] includes sequencing and/or deletion duplication testing of the following 83 genes: ALK, APC, ATM, AXIN2,BAP1,  BARD1, BLM, BMPR1A, BRCA1, BRCA2, BRIP1, CASR, CDC73, CDH1, CDK4, CDKN1B, CDKN1C, CDKN2A (p14ARF), CDKN2A (p16INK4a), CEBPA, CHEK2, CTNNA1, DICER1, DIS3L2, EGFR (c.2369C>T, p.Thr790Met variant only), EPCAM (Deletion/duplication testing only), FH, FLCN, GATA2, GPC3, GREM1 (Promoter region deletion/duplication testing only), HOXB13 (c.251G>A, p.Gly84Glu), HRAS, KIT, MAX, MEN1, MET, MITF (c.952G>A, p.Glu318Lys variant only), MLH1, MSH2, MSH3, MSH6, MUTYH, NBN, NF1, NF2, NTHL1, PALB2, PDGFRA, PHOX2B, PMS2, POLD1, POLE, POT1, PRKAR1A, PTCH1, PTEN, RAD50, RAD51C, RAD51D, RB1, RECQL4, RET, RUNX1, SDHAF2, SDHA (sequence changes only), SDHB, SDHC, SDHD, SMAD4, SMARCA4, SMARCB1, SMARCE1, STK11, SUFU, TERC, TERT, TMEM127, TP53, TSC1, TSC2, VHL, WRN and WT1.   Results: No pathogenic mutations were identified. A VUS in the GATA2 gene c.1348G>A (p.Gly450Arg) was identified.  The date of this test report is 05/25/2017.  05/25/2017 Surgery    RIGHT BREAST  LUMPECTOMY WITH RADIOACTIVE SEED AND  RIGHT AXILLARY SENTINEL LYMPH NODE BIOPSY and Pot placement by Dr. Excell Seltzer 05/25/17    05/25/2017 Pathology Results    Diagnosis 05/25/17 1. Breast, lumpectomy, right - INVASIVE DUCTAL CARCINOMA, GRADE II/III, SPANNING 2.2 CM. - DUCTAL CARCINOMA IN SITU, HIGH GRADE. - THE SURGICAL RESECTION MARGINS ARE NEGATIVE FOR CARCINOMA. - SEE ONCOLOGY TABLE BELOW. 2. Breast, excision, right additional superior margin - BENIGN FIBROADIPOSE TISSUE. - BENIGN SKELETAL MUSCLE. - SEE COMMENT. 3. Breast, excision, right additional lateral margin - BENIGN BREAST PARENCHYMA. - THERE IS NO EVIDENCE OF MALIGNANCY. - SEE COMMENT. 4. Breast, excision, chest wall margin - BENIGN FIBROADIPOSE TISSUE. - THERE IS NO EVIDENCE OF MALIGNANCY. - SEE COMMENT. 5. Lymph node, sentinel, biopsy, right axillary - THERE IS NO EVIDENCE OF CARCINOMA IN 1 OF 1 LYMPH NODE (0/1). 6. Lymph node, sentinel, biopsy, right axillary - THERE IS NO EVIDENCE OF CARCINOMA IN 1 OF 1 LYMPH NODE (0/1). 7. Lymph node, sentinel, biopsy, right axillary - THERE IS NO EVIDENCE OF CARCINOMA IN 1 OF 1 LYMPH NODE (0/1).    06/26/2017 PET scan    PET  IMPRESSION: 1. Postoperative findings both in the right breast and in the anatomic pelvis, with associated low-grade activity considered to be postoperative in nature. No hypermetabolic adenopathy or hypermetabolic lesions are identified to suggest active metastatic disease/malignancy. 2. Other imaging findings of potential clinical significance: Aortic Atherosclerosis (ICD10-I70.0). Sigmoid colon diverticulosis. Biapical pleuroparenchymal scarring in the lungs. Small amount of free pelvic fluid in the cul-de-sac, likely postoperative.     07/01/2017 - 10/14/2017 Chemotherapy    Adjuvant TCH with Onpro every 3 weeks for 6 cycles starting on 07/01/17, Changed Taxol to abraxane with cycle 4 due to drug rash reaction, followed by Herceptin every 3 weeks for  6 months     11/01/2017 - 12/08/2017 Radiation Therapy    Radiation therapy to her right breast with [name]    11/04/2017 -  Chemotherapy    Maintenance Herceptin starting 11/04/17 and will comeplete her 12 months in 06/2018     12/23/2017 -  Anti-estrogen oral therapy    Adjuvant letrozole 2.5 mg daily started 12/23/17, switched to exemestane on 04/21/18 due to joint pain. Could not afford aromasin, started anastrozole 04/2018.     12/30/2017 Imaging    CT CAP W Contrast 12/30/17 IMPRESSION: Increased size of 1.1 cm aorto-caval retroperitoneal lymph node. This could be reactive due to interval hysterectomy, however metastatic carcinoma cannot be excluded. Consider continued attention on short-term follow-up CT, or PET-CT scan for further evaluation.  Mild hepatic steatosis.    01/25/2018 PET scan    IMPRESSION: 1. Enlarging hypermetabolic aortocaval lymph node is worrisome for metastatic disease. 2. Probable postoperative seroma in the medial right breast, with associated mild inflammatory hypermetabolism.    04/28/2018 Mammogram    Probably benign. Calcifications in the right breast most likely are dystrophic, related to previous surgery, and are probably benign.     04/28/2018 Imaging    DEXA Scan T Score -2.0    06/10/2018 Echocardiogram    06/10/2018 ECHO LV EF: 55% -   60%     Endometrial adenocarcinoma ([place])   05/07/2017 Initial Diagnosis    Endometrial adenocarcinoma ([place])    06/09/2017 Surgery    XI ROBOTIC ASSISTED TOTAL LAPOROSCOPIC HYSTERECTOMY WITH BILATERAL SALPINGO OOPHORECTOMY and SENTINEL NODE BIOPSY by Dr. Denman George 06/09/17    06/09/2017 Pathology Results    Diagnosis  06/09/17 1. Lymph node, sentinel, biopsy, right external iliac - METASTATIC ADENOCARCINOMA IN ONE LYMPH NODE (1/1). 2. Lymph node, sentinel, biopsy, left obturator - ONE BENIGN LYMPH NODE (0/1). 3. Lymph node, sentinel, biopsy, left external iliac - METASTATIC ADENOCARCINOMA IN ONE LYMPH NODE  (1/1). 4. Uterus +/- tubes/ovaries, neoplastic ENDOMETRIUM: - ENDOMETRIAL ADENOCARCINOMA, 3.2 CM. - CARCINOMA INVADES INNER HALF OF MYOMETRIUM. 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 - CERVIX, BILATERAL FALLOPIAN TUBES AND BILATERAL OVARIES FREE OF TUMOR    08/18/2017 - 09/17/2017 Radiation Therapy    vaginal brachytherapy per 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     Endometrial cancer ([place])   06/09/2017 Initial Diagnosis    Endometrial cancer ([place])    02/16/2018 -  Chemotherapy    The patient had pembrolizumab (KEYTRUDA) 200 mg in sodium chloride 0.9 % 50 mL chemo infusion, 200 mg, Intravenous, Once, 9 of 11 cycles Administration: 200 mg (02/17/2018), 200 mg (03/10/2018), 200 mg (03/31/2018), 200 mg (04/21/2018), 200 mg (06/02/2018), 200 mg (05/12/2018), 200 mg (06/23/2018), 200 mg (07/14/2018), 200 mg (08/04/2018)  for chemotherapy treatment.      HISTORY OF PRESENTING ILLNESS (05/06/2017):  Dawn [name] 67 y.o. female is here because of newly diagnosed breast cancer. Screening mammogram detected right breast mass on 04/20/2017. Ultrasound showed 1.5 cm mass in the 1:00 position. The axilla was negative on ultrasound. Biopsy of the right breast was performed on 04/27/2017 revealing invasive ductal carcinoma, Grade 2, ER 70% / PR 0% / HER-2+ / Ki-67 30%.   Of note, she was also recently diagnosed with endometrial cancer. Biopsy of curettage endometrium on 04/29/2017 revealed adenocarcinoma. Immunohistology shows the tumor is positive with estrogen receptor, progesterone receptor with patchy positivity with carcinoembryonic antigen and p16. The tumor is negative with CD10 and vimentin. The morphology and immunophenotype are not definitive as to endocervical or endometrial origin. There are rare fragments with endometrial type stroma, which may indicate an endometrial  origin although this is not definitive as to origin.  The patient presents today in our multidisciplinary breast clinic. She is doing well overall. She went to her PCP with abdominal pain and vaginal bleeding which felt like she was having her period all the time. Then she presented to the ER with severe vertigo and heavy vaginal bleeding. She has been having heavy vaginal bleeding since mid-July. She does not have a history of anemia and associates it with recent bleeding. She has been taking oral iron. She took an aspirin daily but, has not been taking it lately. She had been taking Evista for one year and stopped taking it on her own. She was taking this medication for her bone density. She takes 2-3 Tylenol daily to manage the abdominal pain / cramping discomfort. She believes this pain is associated with passing clots. She takes a multivitamin daily. The patient is apprehensive about chemotherapy and is unsure if she would like to proceed with chemotherapy if it is recommended. She reports leg cramping. She does not take potassium supplements but, does try to eat bananas. States liver function is typically elevated and has been for about the last 2-3 years.   She has never smoked and doesn't drink alcohol. She did grow up with heavy smoking parents.   Father had colon cancer diagnosed at 41 years-old, and he died of bladder cancer. Paternal grandmother with some type  of gynecologic cancer, age of onset in her mid-3s. Patient believes there was more cancer in her family in aunts and uncles however, there are no more living family members to discuss this with.   GYN HISTORY  Menarchal: 8 LMP: age of 32 Contraceptive: none  HRT: none  G2P2: She was 71 when she gave first live births.   CURRENT THERAPY:  -Maintenance Herceptin starting 11/04/17 and will complete 12 months in 06/2018 -Adjuvant letrozole 2.5 mg daily started 12/23/17, switched to exemestane on 04/21/18 due to joint pain. Could not  afford aromasin, started anastrozole 04/2018.  -Keytruda every 21 days started on 02/17/2018 for recurrent endometrial cancer  INTERVAL HISTORY:   SHAWNYA MAYOR is here for a follow up of her breast, endometrial cancer and ongoing treatment. Her most recent CT AP scan revealed no residual disease. She saw Np Lacie 3 weeks ago, and was noted to be doing well. Today, she is here with her family member. She is doing well. She states that her energy level is good and she is still able to work 2 days a week or 8 hours. She denies pain. She noticed red itchy pumps on her skin, mainly arms and legs. No rash on her face. She uses topical steroids for relief. She gets a massage once a month. She is tolerating Keytruda and Anastrozole well. She noticed that her nails are breaking, and she is using a salon treatment.     MEDICAL HISTORY:  Past Medical History:  Diagnosis Date  . Anemia    as a child.  . Arthritis   . Bilateral cataracts   . Bilateral leg cramps   . Cancer ([place])    skin  . Dyspnea   . Family history of bladder cancer   . Family history of colon cancer in father   . Fuchs' corneal dystrophy   . GERD (gastroesophageal reflux disease)   . Hearing loss    Right side 30%  . History of hiatal hernia    small size  . History of radiation therapy 08/21/17, 08/28/17,09/01/17, 09/11/17, 09/17/17   Vaginal cuff brachytherapy.   Marland Kitchen History of radiation therapy 11/11/17- 12/08/17   Right Breast treated to 40.05 Gy with 15 fx of 2.67 Gy and a boost of 10 Gy with 5 fx.   . Hyperlipidemia   . Hypertension   . IBS (irritable bowel syndrome)    hx of  . Malignant neoplasm of upper-inner quadrant of right female breast ([place]) 04/2017  . NAFL (nonalcoholic fatty liver)   . Obesity   . PONV (postoperative nausea and vomiting)   . 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 Uterine cancer ([place]) 04/2017   endometrial cancer  . Varices, gastric   . Vertigo     SURGICAL  HISTORY: Past Surgical History:  Procedure Laterality Date  . BREAST BIOPSY Right 04/27/2017  . BREAST LUMPECTOMY WITH RADIOACTIVE SEED AND SENTINEL LYMPH NODE BIOPSY Right 05/25/2017   Procedure: RIGHT BREAST LUMPECTOMY WITH RADIOACTIVE SEED AND  RIGHT AXILLARY SENTINEL LYMPH NODE BIOPSY;  Surgeon: Excell Seltzer, MD;  Location: [facility];  Service: General;  Laterality: Right;  . CATARACT EXTRACTION, BILATERAL    . COLONOSCOPY    . HYSTEROSCOPY W/D&C N/A 04/29/2017   Procedure: DILATATION AND CURETTAGE /HYSTEROSCOPY;  Surgeon: Linda Hedges, DO;  Location: [facility];  Service: Gynecology;  Laterality: N/A;  . PORTACATH PLACEMENT Left 05/25/2017   Procedure: INSERTION PORT-A-CATH;  Surgeon:  Excell Seltzer, MD;  Location: [facility];  Service: General;  Laterality: Left;  . ROBOTIC ASSISTED TOTAL HYSTERECTOMY WITH BILATERAL SALPINGO OOPHERECTOMY N/A 06/09/2017   Procedure: XI ROBOTIC ASSISTED TOTAL LAPOROSCOPIC HYSTERECTOMY WITH BILATERAL SALPINGO OOPHORECTOMY;  Surgeon: Everitt Amber, MD;  Location: [facility];  Service: Gynecology;  Laterality: N/A;  . SENTINEL NODE BIOPSY N/A 06/09/2017   Procedure: SENTINEL NODE BIOPSY;  Surgeon: Everitt Amber, MD;  Location: [facility];  Service: Gynecology;  Laterality: N/A;    SOCIAL HISTORY: Social History   Socioeconomic History  . Marital status: Married    Spouse name: Not on file  . Number of children: Not on file  . Years of education: Not on file  . Highest education level: Not on file  Occupational History    Employer: [employer]    Comment: Nurse, mental health research   Social Needs  . Financial resource strain: Not on file  . Food insecurity:    Worry: Not on file    Inability: Not on file  . Transportation needs:    Medical: Not on file    Non-medical: Not on file  Tobacco Use  . Smoking status: Never Smoker  . Smokeless tobacco: Never Used  Substance and Sexual Activity  . Alcohol use: Yes     Alcohol/week: 0.0 standard drinks    Comment: <1/week wine or beer occasional  . Drug use: No  . Sexual activity: Yes    Birth control/protection: Post-menopausal  Lifestyle  . Physical activity:    Days per week: Not on file    Minutes per session: Not on file  . Stress: Not on file  Relationships  . Social connections:    Talks on phone: Not on file    Gets together: Not on file    Attends religious service: Not on file    Active member of club or organization: Not on file    Attends meetings of clubs or organizations: Not on file    Relationship status: Not on file  . Intimate partner violence:    Fear of current or ex partner: Not on file    Emotionally abused: Not on file    Physically abused: Not on file    Forced sexual activity: Not on file  Other Topics Concern  . Not on file  Social History Narrative   2-3 caffeine drinks per day. Regular exercise.  Walking 3 miles a day.     FAMILY HISTORY: Family History  Problem Relation Age of Onset  . Colon cancer Father 77  . Heart attack Father 8  . Prostate cancer Father        dx 25's  . Bladder Cancer Father 83  . Stroke Mother 81  . Diabetes Maternal Grandfather   . Kidney disease Maternal Grandfather   . Asthma Brother   . Cancer Paternal Grandmother 3       GYN cancer ( thinks ovarian, maybe uterine)  . Heart attack Maternal Grandmother 70  . Breast cancer Other 80  . Colon cancer Other 58  . Cancer Other        type unk, age dx unk    ALLERGIES:  is allergic to ciprofloxacin; crestor [rosuvastatin calcium]; fosamax [alendronate sodium]; penicillins; and taxol [paclitaxel].  MEDICATIONS:  Current Outpatient Medications  Medication Sig Dispense Refill  . acetaminophen (TYLENOL) 500 MG tablet Take 500 mg by mouth every 6 (six) hours as needed for moderate pain.    Marland Kitchen anastrozole (ARIMIDEX) 1  MG tablet Take 1 tablet (1 mg total) by mouth daily. 30 tablet 3  . Ascorbic Acid (VITAMIN C) 1000 MG tablet Take  1,000 mg by mouth daily.    Marland Kitchen b complex vitamins capsule Take 1 capsule by mouth daily.    . Calcium Carbonate-Vitamin D3 (CALCIUM 600-D) 600-400 MG-UNIT TABS Take 1-2 tablets by mouth daily.    . diphenhydrAMINE (BENADRYL) 25 MG tablet Take 25-50 mg by mouth every 6 (six) hours as needed for sleep.    Marland Kitchen ibandronate (BONIVA) 150 MG tablet Take in the morning, once a month,  with a full glass of water,on an empty stomach,do not take anything else by mouth or lie down for the next 30 min. 3 tablet 3  . lidocaine-prilocaine (EMLA) cream   2  . meclizine (ANTIVERT) 25 MG tablet Take 1-2 tablets (25-50 mg total) by mouth 3 (three) times daily as needed for dizziness. (Patient taking differently: Take 25-50 mg by mouth 3 (three) times daily as needed for dizziness (depends on dizziness if takes 1-2 tablets). ) 30 tablet 0  . Multiple Vitamin (MULTIVITAMIN) capsule Take 1 capsule by mouth daily.      Marland Kitchen olmesartan-hydrochlorothiazide (BENICAR HCT) 20-12.5 MG tablet Take 1 tablet by mouth daily. 90 tablet 2  . Pitavastatin Calcium (LIVALO) 2 MG TABS Take 1.5 tablets (3 mg total) by mouth every evening. Takes 1.5 tablet 135 tablet 3  . prednisoLONE acetate (PRED FORTE) 1 % ophthalmic suspension Place 1 drop into the left eye 3 (three) times daily.    . sodium chloride (MURO 128) 5 % ophthalmic solution Place 2 drops into both eyes daily.    Marland Kitchen triamcinolone cream (KENALOG) 0.1 % APPLY TO ITCHY SPOTS ONCE DAILY  1  . Budesonide ER 9 MG TB24 Take 9 mg by mouth daily. 30 tablet 0  . Neratinib Maleate (NERLYNX) 40 MG tablet Take 6 tablets (240 mg total) by mouth daily. Take with food. Start at 4 tabs daily for first week, then increase by 1 tab each week if tolerates 150 tablet 0   No current facility-administered medications for this visit.     REVIEW OF SYSTEMS:   Constitutional: Denies fevers, chills or abnormal night sweats  Eyes: Denies blurriness of vision, double vision or watery eyes Ears, nose,  mouth, throat, and face: Denies mucositis or sore throat Respiratory: Denies cough, dyspnea or wheezes Cardiovascular: Denies palpitation, chest discomfort or lower extremity swelling Gastrointestinal:  Denies nausea, vomiting, constipation, diarrhea, heartburn or change in bowel habits  Skin: Denies abnormal skin rashes. (+) multiple small itchy red skin lesions (+) broken nails  Lymphatics: Denies new lymphadenopathy or easy bruising Musculoskeletal: (+) Musc/joint pain in her left shoulder and low back, (+) joint stiffness in her knees, stable Neurological:Denies numbness, new weaknesses Behavioral/Psych: Mood is stable, no new changes  All other systems were reviewed with the patient and are negative.  PHYSICAL EXAMINATION:  ECOG PERFORMANCE STATUS: 1 - Symptomatic but completely ambulatory  Vitals:   08/04/18 0814  BP: 128/71  Pulse: 74  Resp: 18  Temp: 97.9 F (36.6 C)  SpO2: 96%   Filed Weights   08/04/18 0814  Weight: 188 lb (85.3 kg)     GENERAL:alert, no distress and comfortable SKIN: skin color, texture, turgor are normal, no rashes (+) multiple small red lesions on arms and legs  EYES: normal, conjunctiva are pink and non-injected, sclera clear OROPHARYNX:no exudate, no erythema and lips, buccal mucosa, and tongue normal  NECK: supple, thyroid  normal size, non-tender, without nodularity LYMPH:  no palpable lymphadenopathy in the cervical, axillary or inguinal LUNGS: clear to auscultation with normal breathing effort HEART: regular rate & rhythm and no lower extremity edema (+) heart murmur in the aortic valve ABDOMEN:abdomen soft, non-tender and normal bowel sounds, no organomegaly.  Previous surgical scars have healed very well.   Musculoskeletal:no cyanosis of digits and no clubbing (+) joint stiffness, stable  PSYCH: alert & oriented x 3 with fluent speech NEURO: no focal motor/sensory deficits BREAST: No new palpable masses, nipple retraction, or discharge. No  skin changes.(+) lump on right breast 1-2 O'clock position 7-10 cm from nipple (+) surgical scar healed well  LABORATORY DATA:  I have reviewed the data as listed CBC Latest Ref Rng & Units 08/04/2018 07/14/2018 06/21/2018  WBC 4.0 - 10.5 K/uL 3.6(L) 3.5(L) 4.3  Hemoglobin 12.0 - 15.0 g/dL 13.0 13.1 13.4  Hematocrit 36.0 - 46.0 % 36.6 37.5 38.0  Platelets 150 - 400 K/uL 140(L) 136(L) 167    CMP Latest Ref Rng & Units 08/04/2018 07/14/2018 06/21/2018  Glucose 70 - 99 mg/dL 147(H) 109(H) 100(H)  BUN 8 - 23 mg/dL '9 12 10  '$ Creatinine 0.44 - 1.00 mg/dL 0.79 0.79 0.79  Sodium 135 - 145 mmol/L 135 137 135  Potassium 3.5 - 5.1 mmol/L 3.6 3.6 3.6  Chloride 98 - 111 mmol/L 100 101 98  CO2 22 - 32 mmol/L '27 29 29  '$ Calcium 8.9 - 10.3 mg/dL 9.0 9.3 9.3  Total Protein 6.5 - 8.1 g/dL 6.9 6.8 7.5  Total Bilirubin 0.3 - 1.2 mg/dL 0.7 0.8 0.6  Alkaline Phos 38 - 126 U/L 41 41 53  AST 15 - 41 U/L 69(H) 78(H) 60(H)  ALT 0 - 44 U/L 102(H) 118(H) 105(H)   PATHOLOGY FINDINGS:  Diagnosis 06/09/17 1. Lymph node, sentinel, biopsy, right external iliac - METASTATIC ADENOCARCINOMA IN ONE LYMPH NODE (1/1). 2. Lymph node, sentinel, biopsy, left obturator - ONE BENIGN LYMPH NODE (0/1). 3. Lymph node, sentinel, biopsy, left external iliac - METASTATIC ADENOCARCINOMA IN ONE LYMPH NODE (1/1). 4. Uterus +/- tubes/ovaries, neoplastic ENDOMETRIUM: - ENDOMETRIAL ADENOCARCINOMA, 3.2 CM. - CARCINOMA INVADES INNER HALF OF MYOMETRIUM. 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 - CERVIX, BILATERAL FALLOPIAN TUBES AND BILATERAL OVARIES FREE OF TUMOR. Microscopic Comment 4. ONCOLOGY TABLE-UTERUS, CARCINOMA OR CARCINOSARCOMA Specimen: Uterus with bilateral fallopian tubes and ovaries, right and left external iliac nodes and left obturator lymph node. Procedure: Hysterectomy with bilateral salpingo-oophorectomy and lymph node biopsies. Lymph node sampling performed: Yes. Specimen integrity: Intact. Maximum tumor size:  3.2 cm. Histologic type: Mixed, endometrioid, clear cell and serous. Grade: III. Myometrial invasion: 0.8 cm where myometrium is 2.2 cm in thickness. Cervical stromal involvement: No. Extent of involvement of other organs: N/A. Lymph - vascular invasion: Present. Peritoneal washings: N/A. Lymph nodes: Examined: 3 Sentinel 0 Non-sentinel 3 Total Lymph nodes with metastasis: 2. Isolated tumor cells (< 0.2 mm): 0. Micrometastasis: (> 0.2 mm and < 2.0 mm): 0. Macrometastasis: (> 2.0 mm): 2. Extracapsular extension: No. TNM code: pT1a, pN1. FIGO Stage (based on pathologic findings, needs clinical correlation): IIIC1. Comments: The tumor is 3.2 cm in greatest dimension and invades the myometrium to a depth of 0.8 cm where the myometrium is 2.2 cm in thickness and there is lymphatic vascular involvement by tumor. 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 (JDP:ah 06/10/17)   Diagnosis 05/25/17 1. Breast, lumpectomy, right - INVASIVE DUCTAL CARCINOMA, GRADE II/III, SPANNING 2.2 CM. - DUCTAL CARCINOMA  IN SITU, HIGH GRADE. - THE SURGICAL RESECTION MARGINS ARE NEGATIVE FOR CARCINOMA. - SEE ONCOLOGY TABLE BELOW. 2. Breast, excision, right additional superior margin - BENIGN FIBROADIPOSE TISSUE. - BENIGN SKELETAL MUSCLE. - SEE COMMENT. 3. Breast, excision, right additional lateral margin - BENIGN BREAST PARENCHYMA. - THERE IS NO EVIDENCE OF MALIGNANCY. - SEE COMMENT. 4. Breast, excision, chest wall margin - BENIGN FIBROADIPOSE TISSUE. - THERE IS NO EVIDENCE OF MALIGNANCY. - SEE COMMENT. 5. Lymph node, sentinel, biopsy, right axillary - THERE IS NO EVIDENCE OF CARCINOMA IN 1 OF 1 LYMPH NODE (0/1). 6. Lymph node, sentinel, biopsy, right axillary - THERE IS NO EVIDENCE OF CARCINOMA IN 1 OF 1 LYMPH NODE (0/1). 7. Lymph node, sentinel, biopsy, right axillary - THERE IS NO EVIDENCE OF CARCINOMA IN 1 OF 1 LYMPH NODE (0/1). Microscopic Comment 1. BREAST, INVASIVE  TUMOR Procedure: Seed localized lumpectomy, additional superior, lateral, and chest wall margin resections and axillary lymph node resections. Laterality: Right. Tumor Size: 2.2 cm (gross measurement). Histologic Type: Ductal. Microscopic Comment(continued) Grade: II. Tubular Differentiation: 2. Nuclear Pleomorphism: 2. Mitotic Count: 2. Ductal Carcinoma in Situ (DCIS): Present, high grade. Extent of Tumor: Confined to breast parenchyma. Margins: Greater than or equal to 0.2 cm to all margins. Regional Lymph Nodes: Number of Lymph Nodes Examined: 3. Number of Sentinel Lymph Nodes Examined: 3. Lymph Nodes with Macrometastases: 0. Lymph Nodes with Micrometastases: 0. Lymph Nodes with Isolated Tumor Cells: 0. Breast Prognostic Profile: Case [id number]. Estrogen Receptor: 70%, moderate. Progesterone Receptor: 0%. Her2: Amplification was detected. The ratio was 2.91. Ki-67: 30%. Best tumor block for sendout testing: 1A-1C. Pathologic Stage Classification (pTNM, AJCC 8th Edition): Primary Tumor (pT): pT2. Regional Lymph Nodes (pN): pN0. Distant Metastases (pM): pMX. 2. - 4. The surgical resection margin(s) of the specimen were inked and microscopically evaluated.  Diagnosis 04/29/2017 Endometrium, curettage - ADENOCARCINOMA. - SEE MICROSCOPIC DESCRIPTION. Microscopic Comment There are fragments of adenocarcinoma, some of which have cribriform architecture, and there is focal extracellular mucin. Immunohistochemistry shows the tumor is positive with estrogen receptor, progesterone receptor with patchy positivity with carcinoembryonic antigen and p16. The tumor is negative with CD10 and vimentin. The morphology and immunophenotype are not definitive as to endocervical or endometrial origin. There are rare fragments with endometrial type stroma, which may indicate an endometrial origin although this is not definitive as to origin.  Diagnosis 04/27/2017 Breast, right, needle core  biopsy INVASIVE DUCTAL CARCINOMA, GRADE 2 Microscopic Comment The neoplasm has intracellular mucin and signet ring cell features. Immunostains shows these cells are positive for ER, GATA3, ck7 and GCDFP, negative for ck20, cdx2, TTF-1 and pax8, The immunostaining pattern supports the neoplasm is breast primary. The Breast prognostic profile has been ordered. Results: IMMUNOHISTOCHEMICAL AND MORPHOMETRIC ANALYSIS PERFORMED MANUALLY Estrogen Receptor: 70%, POSITIVE, MODERATE STAINING INTENSITY Progesterone Receptor: 0%, NEGATIVE Proliferation Marker Ki67: 30% Results: HER2 - **POSITIVE** RATIO OF HER2/CEP17 SIGNALS 2.91 AVERAGE HER2 COPY NUMBER PER CELL 7.28  GENETICS 05/07/17   PROCEDURES  06/10/2018 ECHO LV EF: 55% -   60%  ECHO 11/30/17 Impressions: - Normal LV size and systolic function, EF 80-99%. Strain as above.   Normal RV size and systolic function. No significant valvular   abnormalities.  ECHO 08/27/17  Impressions: - Normal LV size and systolic function, EF 83-38%. Strain as above.   Mildly dilated RV with normal systolic function.  ECHO 05/07/17 Impressions: - Normal LV size with mild LV hypertrophy. EF 65-70%, vigorous   systolic function. Strain as above. Normal RV size and  systolic   function. No significant valvular abnormalities.  RADIOGRAPHIC STUDIES: I have personally reviewed the radiological images as listed and agreed with the findings in the report.  06/21/2018 CT AP IMPRESSION: Interval resolution of mildly enlarged aortocaval lymph node. No residual metastatic disease or other acute findings identified.  04/28/2018 Mammogram   04/28/2018 DEXA Scan    ASSESSMENT & PLAN:  67 y.o. 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 Malignant neoplasm of upper inner quadrant of right breast , Invasive ductal carcinoma, pT2N0M0, G2,  ER+/PR-/HER2+ -She underwent right  breast lumpectomy with sentinel lymph node biopsy with Dr. Excell Seltzer on 05/25/2017 with port placement -Pathology confirmed invasive ductal carcinoma, grade 2, spanning 2.2 cm, margins were clear, no positive lymph nodes, ER positive, PR negative, HER-2 positive. I reviewed this with the patient previously -She completed adjuvant TCH every 3 weeks with Onpro from 07/01/17-10/14/17. I changed Taxol to abraxane with cycle 4 due to drug rash reaction. She tolerated relatively well besides the rash and Grade 1 neuropathy. -She has completed a total of 1 year Herceptin therapy -She completed adjuvant radiation therapy on her breast on 11/01/17-12/08/17 with [name]. -She started Letrozole on 12/23/17. Given her new onset joint pain and stiffness I switched her to exemestane. But her copay was too high. She is on Anastrozole since 04/2018.  She is tolerating better, with mild arthralgia. -I again discussed the role of adjuvant Neratinib, which is approved for HER-2 positive early stage breast cancer, especially for ER positive disease.  I discussed the benefit of decreased cancer recurrence by 3 to 4%, and reviewed the side effects including diarrhea, fatigue, abnormal liver functions, etc. patient initially was very reluctant, due to concern of high co-pay, and side effects.  After lengthy discussion, she agrees to try, if her co-pay is reasonable. -Discussed management of diarrhea.  I will start her on neratinib 4 tabs for the first week, then increase to 5 tablets daily for the second week, and 6 tablets daily for the third week.  She will take Imodium 2 tablets in the morning, then use as needed through the day.  She will also take budesonide 9 mg daily for the first month, to decrease diarrhea. -Labs reviewed, Last CBC showed WBC 3.6 Hg 13.0 MCV 99.2. CMP pending.  We will proceed Keytruda today and continue every 3 weeks -F/u in 3 weeks    2. Endometrial adenocarcinoma with lymph node metastasis, pT1apN1M0,  FIGO stage IIIc, MSI-H, probable RP node recurrence in 12/2017  -She underwent a total hysterectomy with salpingo-oophorectomy  by Dr. Denman George on 06/09/17 -We previously discussed her surgical pathology. she has high risk features including lymphovascular involvement, nodal positive disease, and mixed histology with endometrioid, clear cell, and serous carcinoma  -We previously discussed the high risk of cancer recurrence after surgery extensively, and I strongly encouraged her to consider adjuvant chemotherapy to reduce her risk of recurrence. -She completed Taxol/carbo (taxol changed to abraxane) every 3 weeks x6 cycles on 10/14/17 and adjuvant vaginal brachytherapy from 08/18/17-09/17/17. She tolerated well overall. She experiences mild vaginal bleeding when using dilator as instructed per rad onc. -Her 12/30/17 CT scan showed 1.1 cm aorta-caval retroperitoneal lymph node which is borderline reactive. This was further evaluated by PET in 01/2018 which showed mild FDG activity, no other metastasis. -Her case was extensively discussed in GYN tumor board, especially with Drs. Cleda Clarks and Dr. Rhodia Albright at [facility]. The  node is very difficult to biopsy, and the consensus is starting systemic therapy due to the high possibility of node recurrence.  -she has started [place] every 3 weeks on 02/17/2018, tolerating very well, first restaging scan on 07/22/2018 showed no residual disease.  -will continue Keytruda, next scan in 4 months (March 2020)  3. Genetics -Given her personal history of breast and endometrial cancer, and a family history of malignancy.  -She was previously referred to genetics and results were negative   4. HTN -We monitored her Hypertension while on chemo, she previously held HCTZ on days of chemo -F/u with PCP -BP normal lately  5. Transaminitis  -LFTs are chronically elevated due to fatty liver disease -We monitored her liver functions closely while on chemo, stable overall  -Will continue  monitoring   6. Osteopenia  -I previously discussed that aromatase inhibitors can potentially weaken the bone.  -She is on monthly boniva, prescribed by her PCP, since 02/2018.  -I strongly encouraged her to also take Vitamin D and Calcium supplements. -I will check her Vitamin D level every 6 months.  -DEXA scan in 05/2018 revealed T score of -2.0.  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 I previously discussed this can take 1-2 years to recover.  -She will continue B complex. I previously recommended Neurontin for her tingling and pain. I discussed side effects. She would like to hold off for now.  -I previously encouraged her to remain active with exercise.  -Her neuropathy is improving slowly.   8. Fatigue  -she has been having mild fatigue, worse after Keytruda treatment, does recover well -She previously wished to continue working 2 days a week no heavy lifting.  I filled out her FMLA paperwork previously. -She currently works part-time 16hr/week  Follow-up: -Lab and scan reviewed, will continue Keytruda   -I prescribed Neratinib, she will try if copay is reasonable. We discussed the management of diarrhea in detail   -Lab, flush and F/u in 3 weeks (or 9 weeks if she will not take Neratinib) -Next mammogram in 10/2018   No orders of the defined types were placed in this encounter.  Pt and her husband had many questions. All questions were answered. The patient knows to call the clinic with any problems, questions or concerns.  I spent 25 minutes counseling the patient face to face. The total time spent in the appointment was 30 minutes and more than 50% was on counseling.  Dierdre Searles Dweik am acting as scribe for Dr. Truitt Merle.  I have reviewed the above documentation for accuracy and completeness, and I agree with the above.    Truitt Merle  08/04/2018

## 2018-08-04 ENCOUNTER — Inpatient Hospital Stay: Payer: Medicare Other

## 2018-08-04 ENCOUNTER — Inpatient Hospital Stay: Payer: Medicare Other | Attending: Hematology

## 2018-08-04 ENCOUNTER — Telehealth: Payer: Self-pay | Admitting: Pharmacist

## 2018-08-04 ENCOUNTER — Encounter: Payer: Self-pay | Admitting: Hematology

## 2018-08-04 ENCOUNTER — Inpatient Hospital Stay (HOSPITAL_BASED_OUTPATIENT_CLINIC_OR_DEPARTMENT_OTHER): Payer: Medicare Other | Admitting: Hematology

## 2018-08-04 VITALS — BP 128/71 | HR 74 | Temp 97.9°F | Resp 18 | Ht 63.0 in | Wt 188.0 lb

## 2018-08-04 DIAGNOSIS — G620 Drug-induced polyneuropathy: Secondary | ICD-10-CM

## 2018-08-04 DIAGNOSIS — Z5112 Encounter for antineoplastic immunotherapy: Secondary | ICD-10-CM | POA: Diagnosis not present

## 2018-08-04 DIAGNOSIS — E669 Obesity, unspecified: Secondary | ICD-10-CM | POA: Insufficient documentation

## 2018-08-04 DIAGNOSIS — Z79811 Long term (current) use of aromatase inhibitors: Secondary | ICD-10-CM

## 2018-08-04 DIAGNOSIS — M8589 Other specified disorders of bone density and structure, multiple sites: Secondary | ICD-10-CM | POA: Insufficient documentation

## 2018-08-04 DIAGNOSIS — Z95828 Presence of other vascular implants and grafts: Secondary | ICD-10-CM

## 2018-08-04 DIAGNOSIS — Z170 Estrogen receptor positive status [ER+]: Secondary | ICD-10-CM

## 2018-08-04 DIAGNOSIS — Z923 Personal history of irradiation: Secondary | ICD-10-CM

## 2018-08-04 DIAGNOSIS — M810 Age-related osteoporosis without current pathological fracture: Secondary | ICD-10-CM

## 2018-08-04 DIAGNOSIS — C50211 Malignant neoplasm of upper-inner quadrant of right female breast: Secondary | ICD-10-CM | POA: Insufficient documentation

## 2018-08-04 DIAGNOSIS — I10 Essential (primary) hypertension: Secondary | ICD-10-CM

## 2018-08-04 DIAGNOSIS — Z79899 Other long term (current) drug therapy: Secondary | ICD-10-CM | POA: Insufficient documentation

## 2018-08-04 DIAGNOSIS — C541 Malignant neoplasm of endometrium: Secondary | ICD-10-CM

## 2018-08-04 DIAGNOSIS — C771 Secondary and unspecified malignant neoplasm of intrathoracic lymph nodes: Secondary | ICD-10-CM | POA: Diagnosis not present

## 2018-08-04 LAB — CBC WITH DIFFERENTIAL (CANCER CENTER ONLY)
ABS IMMATURE GRANULOCYTES: 0 10*3/uL (ref 0.00–0.07)
BASOS PCT: 1 %
Basophils Absolute: 0 10*3/uL (ref 0.0–0.1)
Eosinophils Absolute: 0.1 10*3/uL (ref 0.0–0.5)
Eosinophils Relative: 2 %
HCT: 36.6 % (ref 36.0–46.0)
Hemoglobin: 13 g/dL (ref 12.0–15.0)
IMMATURE GRANULOCYTES: 0 %
LYMPHS PCT: 44 %
Lymphs Abs: 1.6 10*3/uL (ref 0.7–4.0)
MCH: 35.2 pg — ABNORMAL HIGH (ref 26.0–34.0)
MCHC: 35.5 g/dL (ref 30.0–36.0)
MCV: 99.2 fL (ref 80.0–100.0)
Monocytes Absolute: 0.3 10*3/uL (ref 0.1–1.0)
Monocytes Relative: 9 %
NEUTROS ABS: 1.6 10*3/uL — AB (ref 1.7–7.7)
NEUTROS PCT: 44 %
PLATELETS: 140 10*3/uL — AB (ref 150–400)
RBC: 3.69 MIL/uL — AB (ref 3.87–5.11)
RDW: 11.6 % (ref 11.5–15.5)
WBC Count: 3.6 10*3/uL — ABNORMAL LOW (ref 4.0–10.5)
nRBC: 0 % (ref 0.0–0.2)

## 2018-08-04 LAB — COMPREHENSIVE METABOLIC PANEL
ALBUMIN: 3.6 g/dL (ref 3.5–5.0)
ALK PHOS: 41 U/L (ref 38–126)
ALT: 102 U/L — AB (ref 0–44)
AST: 69 U/L — ABNORMAL HIGH (ref 15–41)
Anion gap: 8 (ref 5–15)
BILIRUBIN TOTAL: 0.7 mg/dL (ref 0.3–1.2)
BUN: 9 mg/dL (ref 8–23)
CALCIUM: 9 mg/dL (ref 8.9–10.3)
CO2: 27 mmol/L (ref 22–32)
Chloride: 100 mmol/L (ref 98–111)
Creatinine, Ser: 0.79 mg/dL (ref 0.44–1.00)
GFR calc Af Amer: >60 mL/min (ref >60)
GFR calc non Af Amer: >60 mL/min (ref >60)
GLUCOSE: 147 mg/dL — AB (ref 70–99)
POTASSIUM: 3.6 mmol/L (ref 3.5–5.1)
Sodium: 135 mmol/L (ref 135–145)
Total Protein: 6.9 g/dL (ref 6.5–8.1)

## 2018-08-04 MED ORDER — BUDESONIDE ER 9 MG PO TB24: 9.0000 mg | ORAL_TABLET | Freq: Every day | ORAL | 0 refills | Status: DC

## 2018-08-04 MED ORDER — SODIUM CHLORIDE 0.9% FLUSH
10.0000 mL | INTRAVENOUS | Status: DC | PRN
  Administered 2018-08-04: 10 mL
  Filled 2018-08-04: qty 10

## 2018-08-04 MED ORDER — SODIUM CHLORIDE 0.9 % IV SOLN
200.0000 mg | Freq: Once | INTRAVENOUS | Status: AC
  Administered 2018-08-04: 200 mg via INTRAVENOUS
  Filled 2018-08-04: qty 8

## 2018-08-04 MED ORDER — NERATINIB MALEATE 40 MG PO TABS: 240.0000 mg | ORAL_TABLET | Freq: Every day | ORAL | 0 refills | Status: DC

## 2018-08-04 MED ORDER — SODIUM CHLORIDE 0.9 % IV SOLN
Freq: Once | INTRAVENOUS | Status: AC
  Administered 2018-08-04: 10:00:00 via INTRAVENOUS
  Filled 2018-08-04: qty 250

## 2018-08-04 MED ORDER — SODIUM CHLORIDE 0.9% FLUSH
10.0000 mL | INTRAVENOUS | Status: DC | PRN
  Administered 2018-08-04: 10 mL via INTRAVENOUS
  Filled 2018-08-04: qty 10

## 2018-08-04 MED ORDER — HEPARIN SOD (PORK) LOCK FLUSH 100 UNIT/ML IV SOLN
500.0000 [IU] | Freq: Once | INTRAVENOUS | Status: AC | PRN
  Administered 2018-08-04: 500 [IU]
  Filled 2018-08-04: qty 5

## 2018-08-04 NOTE — Progress Notes (Signed)
Per [name], ok to treat with an ALT 102.

## 2018-08-04 NOTE — Telephone Encounter (Signed)
Oral Oncology Pharmacist Encounter  Received new prescription for Nerlynx (neratinib) for the extended adjuvant treatment of stage IA hormone receptor positive, Her-2 receptor positive breast cancer in conjunction with anastrozole, planned duration 1 year of Nerlynx therapy.  Patient received adjuvant treatment with [facility] from Oct 2018-Jan 2019 Patient completed 12 months of Herceptin on 06/23/2018  Patient also with the diagnosis of endometrial cancer, currently under treatment with pembrolizumab.  Labs from 08/04/18 assessed, OK for treatment. Noted platelet = 140k, will continue to be monitored  Current medication list in Epic reviewed, DDI with Nerlynx and calcium supplement identified:  Antiacids may decrease the serum concentration of Nerlynx, mechanism is through decreased absorption due to increasing gastric pH with concrement administration.  Patient will be counseled to administer her Nerlynx at least 3 hours after an acid administration.  Prescription will be sent to appropriate specialty pharmacy for dispensing once insurance authorization is obtained. Nerlynx is a limited distribution medication and is not available for dispensing from the [facility].  Oral Oncology Clinic will continue to follow for insurance authorization, copayment issues, initial counseling and start date.  Dawn [name], PharmD, BCPS, BCOP  08/04/2018 10:43 AM Oral Oncology Clinic [phone]

## 2018-08-04 NOTE — Patient Instructions (Signed)
[facility] Discharge Instructions for Patients Receiving Chemotherapy  Today you received the following chemotherapy agents:  Keytruda.  To help prevent nausea and vomiting after your treatment, we encourage you to take your nausea medication as directed.   If you develop nausea and vomiting that is not controlled by your nausea medication, call the clinic.   BELOW ARE SYMPTOMS THAT SHOULD BE REPORTED IMMEDIATELY:  *FEVER GREATER THAN 100.5 F  *CHILLS WITH OR WITHOUT FEVER  NAUSEA AND VOMITING THAT IS NOT CONTROLLED WITH YOUR NAUSEA MEDICATION  *UNUSUAL SHORTNESS OF BREATH  *UNUSUAL BRUISING OR BLEEDING  TENDERNESS IN MOUTH AND THROAT WITH OR WITHOUT PRESENCE OF ULCERS  *URINARY PROBLEMS  *BOWEL PROBLEMS  UNUSUAL RASH Items with * indicate a potential emergency and should be followed up as soon as possible.  Feel free to call the clinic should you have any questions or concerns. The clinic phone number is [phone].  Please show the CHEMO ALERT CARD at check-in to the Emergency Department and triage nurse.    

## 2018-08-04 NOTE — Progress Notes (Signed)
Okay to treat with ALT 102.

## 2018-08-05 ENCOUNTER — Telehealth: Payer: Self-pay

## 2018-08-05 NOTE — Telephone Encounter (Signed)
Oral Oncology [name] Advocate Encounter  Received notification from [employer] that prior authorization for Nerlynx is required.  PA submitted on CoverMyMeds Key [id number] Status is pending  Oral Oncology Clinic will continue to follow.  Dawn [name] [place] Phone [phone] Fax [phone]

## 2018-08-05 NOTE — Telephone Encounter (Signed)
Oral Oncology Patient Advocate Encounter  Prior Authorization for Nerlynx has been approved.    PA# [id number] Effective dates: 08/05/18 through 09/15/19  Oral Oncology Clinic will continue to follow.   [place] Patient [place] Phone [phone] Fax [phone]

## 2018-08-05 NOTE — Progress Notes (Signed)
PA for Budesonide ER 9 mg tabs has been submitted.

## 2018-08-06 MED ORDER — NERATINIB MALEATE 40 MG PO TABS: ORAL_TABLET | ORAL | 0 refills | Status: DC

## 2018-08-06 NOTE — Telephone Encounter (Signed)
Oral Chemotherapy Pharmacist Encounter   Attempted to reach patient to provide update and offer for initial counseling on oral medication: Nerlynx.  No answer. Left VM for patient to call back.  Insurance authorization has been approved. Prescription has been e-scribed to [facility] in [place], [place], PharmD, BCPS, BCOP  08/06/2018   12:36 PM Oral Oncology Clinic [phone]

## 2018-08-09 ENCOUNTER — Telehealth: Payer: Self-pay

## 2018-08-09 MED ORDER — BUDESONIDE ER 9 MG PO TB24: 9.0000 mg | ORAL_TABLET | Freq: Every day | ORAL | 2 refills | Status: DC

## 2018-08-09 MED ORDER — LOPERAMIDE HCL 2 MG PO CAPS: 4.0000 mg | ORAL_CAPSULE | Freq: Three times a day (TID) | ORAL | 2 refills | Status: DC

## 2018-08-09 NOTE — Telephone Encounter (Signed)
Oral Oncology Patient Advocate Encounter  I called [employer] to follow up on the status of the Nerlynx. They have everything they need. The copay is $1750.21. [employer] is the drug manufacturer and they have an assistance program. I filled out an application for the patient. I called Mrs. Sherpa and gave her this information, she is concerned she will make to much money to be approved. We went over her income and I advised that it wouldn't hurt to try. She agreed and will be coming in on Wednesday 08/11/18 to bring her 2018 tax return and sign the application. She verbalized understanding and appreciation.  This encounter will be updated until final determination.  [place] Patient [place] Phone [phone] Fax [phone]

## 2018-08-09 NOTE — Telephone Encounter (Signed)
Oral Oncology Pharmacist Encounter  Spoke with patient today for update on Nerlynx prescription. Patient with very high copayment, it is prohibitively expensive. Patient is working with oral oncology patient advocate to complete manufacturer assistance application in an effort to receive Nerlynx at $0 out-of-pocket cost to patient.  We will plan for face-to-face initial counseling on Wednesday, 08/11/2018, when patient comes to the office to sign manufacturer assistance application. She will call oral oncology clinic on her way to the cancer center to alert [name] to the time that she will be in the office.  We discussed agents for antidiarrheal prophylaxis and manufacturer voucher that can be used at dispensing pharmacy to cover the entire cost of up to 3 antidiarrheal agents for 3 months. Budesonide and loperamide prescriptions have been sent to the [facility] for patient to pick up. She knows to start these medications on the same day as she is able to start her Nerlynx.  We discussed Medicare copayment structure and her frustrations with long-term disability.  All questions answered. Patient expressed understanding and appreciation. She knows to call the office with any additional questions or concerns.  We will follow-up with patient for continued Nerlynx processing and initial counseling on Wednesday.  Johny Drilling, PharmD, BCPS, BCOP  08/09/2018 10:32 AM Oral Oncology Clinic [phone]

## 2018-08-11 NOTE — Telephone Encounter (Signed)
Oral Chemotherapy Pharmacist Encounter   I spoke with patient for overview of: Nerlynx (neratinib) for the extended adjuvant treatment of stage IA hormone receptor positive, Her-2 receptor positive breast cancer in conjunction with anastrozole, planned duration 1 year of Nerlynx therapy.  Last Herceptin infusion: 06/23/2018   Counseled patient on administration, dosing, side effects, monitoring, drug-food interactions, safe handling, storage, and disposal.  Prescribing information for Nerlynx recommends dose initiation at full dose, 6 tablets (240 mg) once daily, with the use of scheduled loperamide antidiarrheal prophylaxis for the first 8 weeks of therapy.  Nerlynx will be initiated on a dose titration schedule. Antidiarrheal prophylaxis with loperamide (28m TID) and budesonide (945mdaily)will be used.  Patient will use additional loperamide as needed if symptoms of diarrhea occur.   Week 1: Patient will take Nerlynx 40102mablets, 4 tablets (160m29my mouth once daily with food Week 2: Patient will take Nerlynx 40mg58mlets, 5 tablets (200mg)68mmouth once daily with food Week 3 and onward: Patient will take Nerlynx 40mg t68mts, 6 tablets (240mg) b75muth once daily with food. 240 mg once daily is the target dose of Nerlynx.  Patient knows to avoid grapefruit and grapefruit juice while on treatment with Nerlynx.  Patient instructed to avoid use of PPIs or H2RAs while on treatment with Nerlynx, can separate antacids from Nerlynx by 3 hours if acid suppression is needed. She will take her Nerlynx in the morning and her calcium supplement in the afternoon.  Nerlynx start date: TBD, pending medication acquisition  Adverse effects include but are not limited to: diarrhea, nausea, vomiting, mouth sores, fatigue, rash, and abdominal pain.    Patient instructed to increase or decrease loperamide dose to maintain 1-2 bowel movements per day.  Prescriptions for budesonide and loperamide have  [facility] patient [facility]. Patient provided voucher to pay for 3 months of antidiarrheal medications. Patient provided written information for use of loperamide and budesonide.  Reviewed with patient importance of keeping a medication schedule and plan for any missed doses.  Mrs. Bohan vChrismerunderstanding and appreciation.   All questions answered. Medication reconciliation performed and medication/allergy list updated.  Copayment for Nerlynx is prohibitively expensive. There are no copayment foundation grants available at this time to help patient with the cost of Nerlynx. Oral oncology patient advocate is working with patient to complete manufacturer assistance application in an effort to obtain Nerlynx at $0 out-of-pocket cost to patient.  Patient knows to call the office with questions or concerns. Oral Oncology Clinic will continue to follow.  Jesse MaJohny Drilling, BCPS, BCOP  08/11/2018   12:50 PM Oral Oncology Clinic [phone]

## 2018-08-11 NOTE — Telephone Encounter (Signed)
Oral Oncology Patient Advocate Encounter  I met with the patient in the lobby today 08/11/18. She signed the application and I faxed it to [facility] on 08/11/18.  This encounter will be updated until final determination  [place] Patient [place] Phone [phone] Fax [phone]

## 2018-08-17 NOTE — Telephone Encounter (Signed)
Oral Oncology Patient Advocate Encounter  I called and followed up on the Nerlynx application today and it is still in processing.  This encounter will be updated until final determination  [place] Patient [place] Phone [phone] Fax [phone]

## 2018-08-18 NOTE — Telephone Encounter (Signed)
Oral Oncology Patient Advocate Encounter  Elberta Fortis from [place] called me requesting an income document. [employer] had done a soft credit check for income but the income they pulled up was to low and she would need to file for LIS. I faxed the [place] letter that Ms. Petraitis emailed to me. Elberta Fortis stated this would be sufficient and she would not need to fill out the LIS information that was sent to her.  I called Ms. Lorman and gave her this information, she verbalized understanding and appreciation.  This encounter will be updated until final determination.    [place] Patient [place] Phone [phone] Fax [phone]

## 2018-08-19 ENCOUNTER — Other Ambulatory Visit: Payer: Self-pay

## 2018-08-19 ENCOUNTER — Telehealth: Payer: Self-pay

## 2018-08-19 DIAGNOSIS — Z170 Estrogen receptor positive status [ER+]: Secondary | ICD-10-CM

## 2018-08-19 DIAGNOSIS — C50211 Malignant neoplasm of upper-inner quadrant of right female breast: Secondary | ICD-10-CM

## 2018-08-19 MED ORDER — NERATINIB MALEATE 40 MG PO TABS: ORAL_TABLET | ORAL | 0 refills | Status: DC

## 2018-08-19 NOTE — Telephone Encounter (Signed)
Oral Oncology Patient Advocate Encounter  Nerlynx has been approved with [employer] Patient[employer] through 09/14/18. Nerlynx will be filled at [facility]. The fax number is [phone]. Riccardo Dubin will contact the patient each month to refill the Nerlynx and they will schedule the shipment to her home free of charge.   I called the patient and gave her this information. I also let her know that I will have another application for her to sign when she comes in for her appointment on 08/25/18 to renew this for next year.  Mrs. Shanafelt verbalized understanding and great appreciation.  [place] Patient [place] Phone [phone] Fax [phone]

## 2018-08-19 NOTE — Telephone Encounter (Signed)
Faxed script for Nerlynx to [facility] [phone]

## 2018-08-19 NOTE — Telephone Encounter (Signed)
Faxed script for Nerlynx to [facility] at [phone]

## 2018-08-24 MED FILL — BUDESONIDE ER 9 MG TAB: 9 | 30 days supply | Qty: 30 | Fill #0

## 2018-08-24 MED FILL — LOPERAMIDE 2 MG CAPSULE: 2 | 25 days supply | Qty: 200 | Fill #0

## 2018-08-25 ENCOUNTER — Inpatient Hospital Stay (HOSPITAL_BASED_OUTPATIENT_CLINIC_OR_DEPARTMENT_OTHER): Payer: Medicare Other | Admitting: Hematology

## 2018-08-25 ENCOUNTER — Inpatient Hospital Stay: Payer: Medicare Other

## 2018-08-25 ENCOUNTER — Inpatient Hospital Stay: Payer: Medicare Other | Attending: Hematology

## 2018-08-25 ENCOUNTER — Telehealth: Payer: Self-pay | Admitting: Hematology

## 2018-08-25 VITALS — BP 136/78 | HR 68 | Temp 98.0°F | Resp 18 | Ht 63.0 in | Wt 188.9 lb

## 2018-08-25 DIAGNOSIS — Z923 Personal history of irradiation: Secondary | ICD-10-CM | POA: Insufficient documentation

## 2018-08-25 DIAGNOSIS — Z79899 Other long term (current) drug therapy: Secondary | ICD-10-CM

## 2018-08-25 DIAGNOSIS — C50211 Malignant neoplasm of upper-inner quadrant of right female breast: Secondary | ICD-10-CM

## 2018-08-25 DIAGNOSIS — C779 Secondary and unspecified malignant neoplasm of lymph node, unspecified: Secondary | ICD-10-CM

## 2018-08-25 DIAGNOSIS — M8589 Other specified disorders of bone density and structure, multiple sites: Secondary | ICD-10-CM | POA: Insufficient documentation

## 2018-08-25 DIAGNOSIS — C541 Malignant neoplasm of endometrium: Secondary | ICD-10-CM | POA: Diagnosis not present

## 2018-08-25 DIAGNOSIS — I10 Essential (primary) hypertension: Secondary | ICD-10-CM | POA: Diagnosis not present

## 2018-08-25 DIAGNOSIS — Z79811 Long term (current) use of aromatase inhibitors: Secondary | ICD-10-CM | POA: Insufficient documentation

## 2018-08-25 DIAGNOSIS — G620 Drug-induced polyneuropathy: Secondary | ICD-10-CM | POA: Insufficient documentation

## 2018-08-25 DIAGNOSIS — M810 Age-related osteoporosis without current pathological fracture: Secondary | ICD-10-CM

## 2018-08-25 DIAGNOSIS — Z5112 Encounter for antineoplastic immunotherapy: Secondary | ICD-10-CM | POA: Insufficient documentation

## 2018-08-25 DIAGNOSIS — Z170 Estrogen receptor positive status [ER+]: Secondary | ICD-10-CM | POA: Insufficient documentation

## 2018-08-25 DIAGNOSIS — Z95828 Presence of other vascular implants and grafts: Secondary | ICD-10-CM

## 2018-08-25 LAB — COMPREHENSIVE METABOLIC PANEL
ALT: 122 U/L — ABNORMAL HIGH (ref 0–44)
ANION GAP: 8 (ref 5–15)
AST: 82 U/L — ABNORMAL HIGH (ref 15–41)
Albumin: 3.7 g/dL (ref 3.5–5.0)
Alkaline Phosphatase: 55 U/L (ref 38–126)
BUN: 11 mg/dL (ref 8–23)
CHLORIDE: 102 mmol/L (ref 98–111)
CO2: 28 mmol/L (ref 22–32)
Calcium: 9.1 mg/dL (ref 8.9–10.3)
Creatinine, Ser: 0.8 mg/dL (ref 0.44–1.00)
GFR calc Af Amer: >60 mL/min (ref >60)
GFR calc non Af Amer: >60 mL/min (ref >60)
GLUCOSE: 120 mg/dL — AB (ref 70–99)
POTASSIUM: 3.4 mmol/L — AB (ref 3.5–5.1)
Sodium: 138 mmol/L (ref 135–145)
Total Bilirubin: 0.7 mg/dL (ref 0.3–1.2)
Total Protein: 7.1 g/dL (ref 6.5–8.1)

## 2018-08-25 LAB — CBC WITH DIFFERENTIAL/PLATELET
Abs Immature Granulocytes: 0 10*3/uL (ref 0.00–0.07)
Basophils Absolute: 0 10*3/uL (ref 0.0–0.1)
Basophils Relative: 1 %
Eosinophils Absolute: 0.1 10*3/uL (ref 0.0–0.5)
Eosinophils Relative: 2 %
HCT: 38 % (ref 36.0–46.0)
Hemoglobin: 13.2 g/dL (ref 12.0–15.0)
Immature Granulocytes: 0 %
Lymphocytes Relative: 44 %
Lymphs Abs: 1.7 10*3/uL (ref 0.7–4.0)
MCH: 34.8 pg — ABNORMAL HIGH (ref 26.0–34.0)
MCHC: 34.7 g/dL (ref 30.0–36.0)
MCV: 100.3 fL — ABNORMAL HIGH (ref 80.0–100.0)
MONOS PCT: 14 %
Monocytes Absolute: 0.5 10*3/uL (ref 0.1–1.0)
Neutro Abs: 1.4 10*3/uL — ABNORMAL LOW (ref 1.7–7.7)
Neutrophils Relative %: 39 %
Platelets: 143 10*3/uL — ABNORMAL LOW (ref 150–400)
RBC: 3.79 MIL/uL — ABNORMAL LOW (ref 3.87–5.11)
RDW: 11.9 % (ref 11.5–15.5)
WBC: 3.7 10*3/uL — ABNORMAL LOW (ref 4.0–10.5)
nRBC: 0 % (ref 0.0–0.2)

## 2018-08-25 MED ORDER — HEPARIN SOD (PORK) LOCK FLUSH 100 UNIT/ML IV SOLN
500.0000 [IU] | Freq: Once | INTRAVENOUS | Status: AC | PRN
  Administered 2018-08-25: 500 [IU]
  Filled 2018-08-25: qty 5

## 2018-08-25 MED ORDER — SODIUM CHLORIDE 0.9% FLUSH
10.0000 mL | INTRAVENOUS | Status: DC | PRN
  Administered 2018-08-25: 10 mL
  Filled 2018-08-25: qty 10

## 2018-08-25 MED ORDER — SODIUM CHLORIDE 0.9% FLUSH
10.0000 mL | INTRAVENOUS | Status: DC | PRN
  Administered 2018-08-25: 10 mL via INTRAVENOUS
  Filled 2018-08-25: qty 10

## 2018-08-25 MED ORDER — SODIUM CHLORIDE 0.9 % IV SOLN
200.0000 mg | Freq: Once | INTRAVENOUS | Status: AC
  Administered 2018-08-25: 200 mg via INTRAVENOUS
  Filled 2018-08-25: qty 8

## 2018-08-25 MED ORDER — SODIUM CHLORIDE 0.9 % IV SOLN
Freq: Once | INTRAVENOUS | Status: AC
  Administered 2018-08-25: 10:00:00 via INTRAVENOUS
  Filled 2018-08-25: qty 250

## 2018-08-25 NOTE — Telephone Encounter (Signed)
Spoke with patient and verified her appointments for 12/30.

## 2018-08-25 NOTE — Patient Instructions (Signed)
[facility] Discharge Instructions for Patients Receiving Chemotherapy  Today you received the following chemotherapy agents:  Keytruda.  To help prevent nausea and vomiting after your treatment, we encourage you to take your nausea medication as directed.   If you develop nausea and vomiting that is not controlled by your nausea medication, call the clinic.   BELOW ARE SYMPTOMS THAT SHOULD BE REPORTED IMMEDIATELY:  *FEVER GREATER THAN 100.5 F  *CHILLS WITH OR WITHOUT FEVER  NAUSEA AND VOMITING THAT IS NOT CONTROLLED WITH YOUR NAUSEA MEDICATION  *UNUSUAL SHORTNESS OF BREATH  *UNUSUAL BRUISING OR BLEEDING  TENDERNESS IN MOUTH AND THROAT WITH OR WITHOUT PRESENCE OF ULCERS  *URINARY PROBLEMS  *BOWEL PROBLEMS  UNUSUAL RASH Items with * indicate a potential emergency and should be followed up as soon as possible.  Feel free to call the clinic should you have any questions or concerns. The clinic phone number is [phone].  Please show the CHEMO ALERT CARD at check-in to the Emergency Department and triage nurse.    

## 2018-08-25 NOTE — Addendum Note (Signed)
Addended by: Arty Baumgartner on: 08/25/2018 11:18 AM   Modules accepted: Orders

## 2018-08-25 NOTE — Progress Notes (Signed)
[place]   [phone] [phone]   Clinic Follow up Note   Patient Care Team: [name], MD as PCP - Haskell Riling, MD as Consulting Physician (General Surgery) Truitt Merle, MD as Consulting Physician (Hematology) [name], MD as Attending Physician (Radiation Oncology)  Date of Service:  08/25/2018  CHIEF COMPLAINT: Follow up of Breast cancer and endometrial cancer  SUMMARY OF ONCOLOGIC HISTORY: Oncology History   MSI high Cancer Staging Endometrial cancer [facility]) Staging form: Corpus Uteri - Adenosarcoma, AJCC 8th Edition - Pathologic stage from 06/09/2017: Stage IIIC (pT1a, pN1, cM0) - Signed by Truitt Merle, MD on 06/16/2017  Malignant neoplasm of upper-inner quadrant of right breast in female, estrogen receptor positive ([place]) Staging form: Breast, AJCC 8th Edition - Clinical stage from 05/06/2017: Stage IA (cT1c, cN0, cM0, G2, ER: Positive, PR: Negative, HER2: Positive) - Unsigned - Pathologic stage from 05/25/2017: Stage IIA (pT2, pN0, cM0, G2, ER: Positive, PR: Negative, HER2: Negative) - Signed by Truitt Merle, MD on 06/16/2017       Malignant neoplasm of upper-inner quadrant of right breast in female, estrogen receptor positive ([place])   04/27/2017 Initial Biopsy    Diagnosis Breast, right, needle core biopsy INVASIVE DUCTAL CARCINOMA, GRADE 2 Microscopic Comment The neoplasm has intracellular mucin and signet ring cell features. Immunostains shows these cells are positive for ER,GATA3, ck7 and GCDFP, negative for ck20, cdx2, TTF-1 and pax8, The immunostaining pattern supports the neoplasm is breast primary.    04/27/2017 Receptors her2    Estrogen Receptor: 70%, POSITIVE, MODERATE STAINING INTENSITY Progesterone Receptor: 0%, NEGATIVE Proliferation Marker Ki67: 30% HER2 - **POSITIVE** RATIO OF HER2/CEP17 SIGNALS 2.91 AVERAGE HER2 COPY NUMBER PER CELL 7.28    04/27/2017 Initial Diagnosis    Malignant neoplasm  of upper-inner quadrant of right breast in female, estrogen receptor positive ([place])    05/07/2017 Imaging    CT Chest W Contrast 05/07/17 IMPRESSION: Tiny well-defined fatty lesion on the pleura at the right lung base. The thinner slice collimation used for today's chest CT eliminates volume-averaging seen in the lesion on the prior exam and confirms that this is a diffusely fatty nodule. This is a benign finding and likely represents a tiny lipoma. No defect in the hemidiaphragm evident to suggest tiny diaphragmatic hernia. Pulmonary hamartoma a consideration although the lack of soft tissue components makes this less likely.    05/15/2017 Genetic Testing    Patient had genetic testing due to a personal history of breast cancer and uterine cancer as well as a family history of cancer. The Multi-Cancer panel was ordered. The Multi-Cancer Panel offered by [facility] includes sequencing and/or deletion duplication testing of the following 83 genes: ALK, APC, ATM, AXIN2,BAP1,  BARD1, BLM, BMPR1A, BRCA1, BRCA2, BRIP1, CASR, CDC73, CDH1, CDK4, CDKN1B, CDKN1C, CDKN2A (p14ARF), CDKN2A (p16INK4a), CEBPA, CHEK2, CTNNA1, DICER1, DIS3L2, EGFR (c.2369C>T, p.Thr790Met variant only), EPCAM (Deletion/duplication testing only), FH, FLCN, GATA2, GPC3, GREM1 (Promoter region deletion/duplication testing only), HOXB13 (c.251G>A, p.Gly84Glu), HRAS, KIT, MAX, MEN1, MET, MITF (c.952G>A, p.Glu318Lys variant only), MLH1, MSH2, MSH3, MSH6, MUTYH, NBN, NF1, NF2, NTHL1, PALB2, PDGFRA, PHOX2B, PMS2, POLD1, POLE, POT1, PRKAR1A, PTCH1, PTEN, RAD50, RAD51C, RAD51D, RB1, RECQL4, RET, RUNX1, SDHAF2, SDHA (sequence changes only), SDHB, SDHC, SDHD, SMAD4, SMARCA4, SMARCB1, SMARCE1, STK11, SUFU, TERC, TERT, TMEM127, TP53, TSC1, TSC2, VHL, WRN and WT1.   Results: No pathogenic mutations were identified. A VUS in the GATA2 gene c.1348G>A (p.Gly450Arg) was identified.  The date of this test report is 05/25/2017.  05/25/2017 Surgery     RIGHT BREAST LUMPECTOMY WITH RADIOACTIVE SEED AND  RIGHT AXILLARY SENTINEL LYMPH NODE BIOPSY and Pot placement by Dr. Excell Seltzer 05/25/17    05/25/2017 Pathology Results    Diagnosis 05/25/17 1. Breast, lumpectomy, right - INVASIVE DUCTAL CARCINOMA, GRADE II/III, SPANNING 2.2 CM. - DUCTAL CARCINOMA IN SITU, HIGH GRADE. - THE SURGICAL RESECTION MARGINS ARE NEGATIVE FOR CARCINOMA. - SEE ONCOLOGY TABLE BELOW. 2. Breast, excision, right additional superior margin - BENIGN FIBROADIPOSE TISSUE. - BENIGN SKELETAL MUSCLE. - SEE COMMENT. 3. Breast, excision, right additional lateral margin - BENIGN BREAST PARENCHYMA. - THERE IS NO EVIDENCE OF MALIGNANCY. - SEE COMMENT. 4. Breast, excision, chest wall margin - BENIGN FIBROADIPOSE TISSUE. - THERE IS NO EVIDENCE OF MALIGNANCY. - SEE COMMENT. 5. Lymph node, sentinel, biopsy, right axillary - THERE IS NO EVIDENCE OF CARCINOMA IN 1 OF 1 LYMPH NODE (0/1). 6. Lymph node, sentinel, biopsy, right axillary - THERE IS NO EVIDENCE OF CARCINOMA IN 1 OF 1 LYMPH NODE (0/1). 7. Lymph node, sentinel, biopsy, right axillary - THERE IS NO EVIDENCE OF CARCINOMA IN 1 OF 1 LYMPH NODE (0/1).    06/26/2017 PET scan    PET  IMPRESSION: 1. Postoperative findings both in the right breast and in the anatomic pelvis, with associated low-grade activity considered to be postoperative in nature. No hypermetabolic adenopathy or hypermetabolic lesions are identified to suggest active metastatic disease/malignancy. 2. Other imaging findings of potential clinical significance: Aortic Atherosclerosis (ICD10-I70.0). Sigmoid colon diverticulosis. Biapical pleuroparenchymal scarring in the lungs. Small amount of free pelvic fluid in the cul-de-sac, likely postoperative.     07/01/2017 - 10/14/2017 Chemotherapy    Adjuvant TCH with Onpro every 3 weeks for 6 cycles starting on 07/01/17, Changed Taxol to abraxane with cycle 4 due to drug rash reaction, followed by Herceptin every  3 weeks for 6 months     11/01/2017 - 12/08/2017 Radiation Therapy    Radiation therapy to her right breast with [name]    11/04/2017 -  Chemotherapy    Maintenance Herceptin starting 11/04/17 and will comeplete her 12 months in 06/2018     12/23/2017 -  Anti-estrogen oral therapy    Adjuvant letrozole 2.5 mg daily started 12/23/17, switched to exemestane on 04/21/18 due to joint pain. Could not afford aromasin, started anastrozole 04/2018.     12/30/2017 Imaging    CT CAP W Contrast 12/30/17 IMPRESSION: Increased size of 1.1 cm aorto-caval retroperitoneal lymph node. This could be reactive due to interval hysterectomy, however metastatic carcinoma cannot be excluded. Consider continued attention on short-term follow-up CT, or PET-CT scan for further evaluation.  Mild hepatic steatosis.    01/25/2018 PET scan    IMPRESSION: 1. Enlarging hypermetabolic aortocaval lymph node is worrisome for metastatic disease. 2. Probable postoperative seroma in the medial right breast, with associated mild inflammatory hypermetabolism.    04/28/2018 Mammogram    Probably benign. Calcifications in the right breast most likely are dystrophic, related to previous surgery, and are probably benign.     04/28/2018 Imaging    DEXA Scan T Score -2.0    06/10/2018 Echocardiogram    06/10/2018 ECHO LV EF: 55% -   60%     Endometrial adenocarcinoma ([place])   05/07/2017 Initial Diagnosis    Endometrial adenocarcinoma ([place])    06/09/2017 Surgery    XI ROBOTIC ASSISTED TOTAL LAPOROSCOPIC HYSTERECTOMY WITH BILATERAL SALPINGO OOPHORECTOMY and SENTINEL NODE BIOPSY by Dr. Denman George 06/09/17    06/09/2017 Pathology Results    Diagnosis  06/09/17 1. Lymph node, sentinel, biopsy, right external iliac - METASTATIC ADENOCARCINOMA IN ONE LYMPH NODE (1/1). 2. Lymph node, sentinel, biopsy, left obturator - ONE BENIGN LYMPH NODE (0/1). 3. Lymph node, sentinel, biopsy, left external iliac - METASTATIC ADENOCARCINOMA IN ONE LYMPH  NODE (1/1). 4. Uterus +/- tubes/ovaries, neoplastic ENDOMETRIUM: - ENDOMETRIAL ADENOCARCINOMA, 3.2 CM. - CARCINOMA INVADES INNER HALF OF MYOMETRIUM. 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 - CERVIX, BILATERAL FALLOPIAN TUBES AND BILATERAL OVARIES FREE OF TUMOR    08/18/2017 - 09/17/2017 Radiation Therapy    vaginal brachytherapy per 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     Endometrial cancer ([place])   06/09/2017 Initial Diagnosis    Endometrial cancer ([place])    02/16/2018 -  Chemotherapy    The patient had pembrolizumab (KEYTRUDA) 200 mg in sodium chloride 0.9 % 50 mL chemo infusion, 200 mg, Intravenous, Once, 9 of 11 cycles Administration: 200 mg (02/17/2018), 200 mg (03/10/2018), 200 mg (03/31/2018), 200 mg (04/21/2018), 200 mg (06/02/2018), 200 mg (05/12/2018), 200 mg (06/23/2018), 200 mg (07/14/2018), 200 mg (08/04/2018)  for chemotherapy treatment.       CURRENT THERAPY:  -Keytruda every 3 weeks  -Neratinib daily  -Anastrozole daily   INTERVAL HISTORY:  Dawn [name] is here for a follow up. She presents to the clinic today with her husband. She notes she plans to receive her Neratinib tomorrow to start. She notes epistaxis, headaches and pain in her breast. The breast pain is shooting pain that she relates to the cold weather. She plans to follow up with Dr. Excell Seltzer next month. She has concerns with her breast calcifications. The occasional headaches 3/10 and cause her to forget. This has been going on for the last 2-3 weeks. She notes right eye blurring which she attributes to her cataract. She notes neuropathy remains in her feet.  She has no balance issues. She notes the epistaxis is from the dry nose. She is not having much nasal discharge and mild post nasal drip. She denies fever.    REVIEW OF SYSTEMS:   Constitutional: Denies fevers, chills or abnormal  weight loss (+) occasional headaches  Eyes: Denies blurriness of vision Ears, nose, mouth, throat, and face: Denies mucositis or sore throat (+) occasional epistaxis form dry nose  Respiratory: Denies cough, dyspnea or wheezes Cardiovascular: Denies palpitation, chest discomfort or lower extremity swelling MSK: (+) Joint stiffness Gastrointestinal:  Denies nausea, heartburn or change in bowel habits Skin: Denies abnormal skin rashes Lymphatics: Denies new lymphadenopathy or easy bruising Neurological:Denies new weaknesses (+) neuropathy in her feet Behavioral/Psych: Mood is stable, no new changes  Breast: (+) shooting breast pain  All other systems were reviewed with the patient and are negative.  MEDICAL HISTORY:  Past Medical History:  Diagnosis Date  . Anemia    as a child.  . Arthritis   . Bilateral cataracts   . Bilateral leg cramps   . Cancer ([place])    skin  . Dyspnea   . Family history of bladder cancer   . Family history of colon cancer in father   . Fuchs' corneal dystrophy   . GERD (gastroesophageal reflux disease)   . Hearing loss    Right side 30%  . History of hiatal hernia    small size  . History of radiation therapy 08/21/17, 08/28/17,09/01/17, 09/11/17, 09/17/17   Vaginal cuff  brachytherapy.   Marland Kitchen History of radiation therapy 11/11/17- 12/08/17   Right Breast treated to 40.05 Gy with 15 fx of 2.67 Gy and a boost of 10 Gy with 5 fx.   . Hyperlipidemia   . Hypertension   . IBS (irritable bowel syndrome)    hx of  . Malignant neoplasm of upper-inner quadrant of right female breast ([place]) 04/2017  . NAFL (nonalcoholic fatty liver)   . Obesity   . PONV (postoperative nausea and vomiting)   . 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 Uterine cancer ([place]) 04/2017   endometrial cancer  . Varices, gastric   . Vertigo     SURGICAL HISTORY: Past Surgical History:  Procedure Laterality Date  . BREAST BIOPSY Right 04/27/2017  . BREAST  LUMPECTOMY WITH RADIOACTIVE SEED AND SENTINEL LYMPH NODE BIOPSY Right 05/25/2017   Procedure: RIGHT BREAST LUMPECTOMY WITH RADIOACTIVE SEED AND  RIGHT AXILLARY SENTINEL LYMPH NODE BIOPSY;  Surgeon: Excell Seltzer, MD;  Location: [facility];  Service: General;  Laterality: Right;  . CATARACT EXTRACTION, BILATERAL    . COLONOSCOPY    . HYSTEROSCOPY W/D&C N/A 04/29/2017   Procedure: DILATATION AND CURETTAGE /HYSTEROSCOPY;  Surgeon: Linda Hedges, DO;  Location: [facility];  Service: Gynecology;  Laterality: N/A;  . PORTACATH PLACEMENT Left 05/25/2017   Procedure: INSERTION PORT-A-CATH;  Surgeon: Excell Seltzer, MD;  Location: [facility];  Service: General;  Laterality: Left;  . ROBOTIC ASSISTED TOTAL HYSTERECTOMY WITH BILATERAL SALPINGO OOPHERECTOMY N/A 06/09/2017   Procedure: XI ROBOTIC ASSISTED TOTAL LAPOROSCOPIC HYSTERECTOMY WITH BILATERAL SALPINGO OOPHORECTOMY;  Surgeon: Everitt Amber, MD;  Location: [facility];  Service: Gynecology;  Laterality: N/A;  . SENTINEL NODE BIOPSY N/A 06/09/2017   Procedure: SENTINEL NODE BIOPSY;  Surgeon: Everitt Amber, MD;  Location: [facility];  Service: Gynecology;  Laterality: N/A;    I have reviewed the social history and family history with the patient and they are unchanged from previous note.  ALLERGIES:  is allergic to ciprofloxacin; crestor [rosuvastatin calcium]; fosamax [alendronate sodium]; penicillins; and taxol [paclitaxel].  MEDICATIONS:  Current Outpatient Medications  Medication Sig Dispense Refill  . acetaminophen (TYLENOL) 500 MG tablet Take 500 mg by mouth every 6 (six) hours as needed for moderate pain.    Marland Kitchen anastrozole (ARIMIDEX) 1 MG tablet Take 1 tablet (1 mg total) by mouth daily. 30 tablet 3  . Ascorbic Acid (VITAMIN C) 1000 MG tablet Take 1,000 mg by mouth daily.    Marland Kitchen b complex vitamins capsule Take 1 capsule by mouth daily.    . Budesonide ER 9 MG TB24 Take 9 mg by mouth daily. 30 tablet 2  . Calcium  Carbonate-Vitamin D3 (CALCIUM 600-D) 600-400 MG-UNIT TABS Take 1-2 tablets by mouth daily.    . diphenhydrAMINE (BENADRYL) 25 MG tablet Take 25-50 mg by mouth every 6 (six) hours as needed for sleep.    Marland Kitchen ibandronate (BONIVA) 150 MG tablet Take in the morning, once a month,  with a full glass of water,on an empty stomach,do not take anything else by mouth or lie down for the next 30 min. 3 tablet 3  . lidocaine-prilocaine (EMLA) cream   2  . loperamide (IMODIUM) 2 MG capsule Take 2 capsules (4 mg total) by mouth 3 (three) times daily. May take additional 2-'4mg'$  as needed for breakthrough diarrhea. 200 capsule 2  . meclizine (ANTIVERT) 25 MG tablet Take 1-2 tablets (25-50 mg total) by mouth 3 (three) times daily  as needed for dizziness. (Patient taking differently: Take 25-50 mg by mouth 3 (three) times daily as needed for dizziness (depends on dizziness if takes 1-2 tablets). ) 30 tablet 0  . Multiple Vitamin (MULTIVITAMIN) capsule Take 1 capsule by mouth daily.      . Neratinib Maleate (NERLYNX) 40 MG tablet Week1: Take 4 tabs ('160mg'$ ) by mouth once daily. TGGY6: Take 5 tab ('200mg'$ ) daily. RSWN4 & on: Take 6 tab ('240mg'$ ) daily. Take with food. 160 tablet 0  . olmesartan-hydrochlorothiazide (BENICAR HCT) 20-12.5 MG tablet Take 1 tablet by mouth daily. 90 tablet 2  . Pitavastatin Calcium (LIVALO) 2 MG TABS Take 1.5 tablets (3 mg total) by mouth every evening. Takes 1.5 tablet 135 tablet 3  . prednisoLONE acetate (PRED FORTE) 1 % ophthalmic suspension Place 1 drop into the left eye 3 (three) times daily.    . sodium chloride (MURO 128) 5 % ophthalmic solution Place 2 drops into both eyes daily.    Marland Kitchen triamcinolone cream (KENALOG) 0.1 % APPLY TO ITCHY SPOTS ONCE DAILY  1   No current facility-administered medications for this visit.     PHYSICAL EXAMINATION: ECOG PERFORMANCE STATUS: 1 - Symptomatic but completely ambulatory  Vitals:   08/25/18 0857  BP: 136/78  Pulse: 68  Resp: 18  Temp: 98 F (36.7  C)  SpO2: 98%   Filed Weights   08/25/18 0857  Weight: 188 lb 14.4 oz (85.7 kg)    GENERAL:alert, no distress and comfortable SKIN: skin color, texture, turgor are normal, no rashes or significant lesions EYES: normal, Conjunctiva are pink and non-injected, sclera clear OROPHARYNX:no exudate, no erythema and lips, buccal mucosa, and tongue normal  NECK: supple, thyroid normal size, non-tender, without nodularity LYMPH:  no palpable lymphadenopathy in the cervical, axillary or inguinal LUNGS: clear to auscultation and percussion with normal breathing effort HEART: regular rate & rhythm and no murmurs and no lower extremity edema ABDOMEN:abdomen soft, non-tender and normal bowel sounds Musculoskeletal:no cyanosis of digits and no clubbing  NEURO: alert & oriented x 3 with fluent speech, no focal motor/sensory deficits BREAST: S/p right breast lumpectomy: Surgical incision healed well. Lumpy breast tissue, but no major mass.   LABORATORY DATA:  I have reviewed the data as listed CBC Latest Ref Rng & Units 08/25/2018 08/04/2018 07/14/2018  WBC 4.0 - 10.5 K/uL 3.7(L) 3.6(L) 3.5(L)  Hemoglobin 12.0 - 15.0 g/dL 13.2 13.0 13.1  Hematocrit 36.0 - 46.0 % 38.0 36.6 37.5  Platelets 150 - 400 K/uL 143(L) 140(L) 136(L)     CMP Latest Ref Rng & Units 08/04/2018 07/14/2018 06/21/2018  Glucose 70 - 99 mg/dL 147(H) 109(H) 100(H)  BUN 8 - 23 mg/dL '9 12 10  '$ Creatinine 0.44 - 1.00 mg/dL 0.79 0.79 0.79  Sodium 135 - 145 mmol/L 135 137 135  Potassium 3.5 - 5.1 mmol/L 3.6 3.6 3.6  Chloride 98 - 111 mmol/L 100 101 98  CO2 22 - 32 mmol/L '27 29 29  '$ Calcium 8.9 - 10.3 mg/dL 9.0 9.3 9.3  Total Protein 6.5 - 8.1 g/dL 6.9 6.8 7.5  Total Bilirubin 0.3 - 1.2 mg/dL 0.7 0.8 0.6  Alkaline Phos 38 - 126 U/L 41 41 53  AST 15 - 41 U/L 69(H) 78(H) 60(H)  ALT 0 - 44 U/L 102(H) 118(H) 105(H)      RADIOGRAPHIC STUDIES: I have personally reviewed the radiological images as listed and agreed with the findings  in the report. No results found.   ASSESSMENT & PLAN:  Dawn [name] is a 67 y.o. female with   1. Malignant neoplasm of upper inner quadrant of right breast , Invasive ductal carcinoma, pT2N0M0, G2,  ER+/PR-/HER2+ -She was diagnosed in 04/2017. She is s/p right breast lumpectomy, adjuvant chemotherapy, radiation and currently on Anastrozole.  -She is tolerating well with joint stiffness. I encouraged her to remain active.  -She plans to start Neratinib tomorrow when she receives medication. She will start from low dose '160mg'$  daily and titrate up weekly to '240mg'$  daily. I reviewed antidiarrheal use.  -Labs reviewed, CBC stable, CMP is still pending.  -Continue Anastrozole -F/u in 1 month   2. Endometrial adenocarcinoma with lymph node metastasis, pT1apN1M0, FIGO stage IIIc, MSI-H, probable RP node recurrence in 12/2017  -Diagnosed in 04/2017. She is S/p total hysterectomy with BSO and adjuvant radiation. She completed Taxol/carbo (taxol changed to abraxane)every 3 weeks x6 cycles on 10/14/17 and adjuvant vaginal brachytherapyfrom12/4/18-09/17/17. -She is currently on Keytruda every 3 weeks since 02/17/2018, tolerating very well.  -will continue Keytruda, next scan in March 2020 -Will proceed with Keytruda today   3. Genetics -She was previously referred to genetics and results were negative   4. HTN -F/u with PCP -BP normal lately   5. Transaminitis  -Secondary to fatty liver disease -We monitored her liver functions closely while on chemo, stable overall   6. Osteopenia  -DEXA scan in 05/2018 revealed T score of -2.0. -She is on monthly boniva, prescribed by her PCP, since 02/2018.  -Continue Vitamin D and Calcium supplements. -Check her Vitamin D level every 6 months.   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  -Continue B complex. She would like to hold off on Gabapentin at this time. -Her neuropathy mainly remains in feet, stable   8. Fatigue  -She  currently works part-time 16hr/week -Stable fatigue, able to function well   Follow-up: -Lab and scan reviewed, will continue Keytruda today  -Continue Anastrozole  -Start Neratinib next Monday  -f/u in 3 weeks      No problem-specific Assessment & Plan notes found for this encounter.   No orders of the defined types were placed in this encounter.  All questions were answered. The patient knows to call the clinic with any problems, questions or concerns. No barriers to learning was detected. I spent 20 minutes counseling the patient face to face. The total time spent in the appointment was 25 minutes and more than 50% was on counseling and review of test results     Truitt Merle, MD 08/25/2018   I, Joslyn Devon, am acting as scribe for Truitt Merle, MD.   I have reviewed the above documentation for accuracy and completeness, and I agree with the above.

## 2018-08-25 NOTE — Progress Notes (Signed)
Ok to treat with elevated liver enzymes and ANC 1.4  Per [name]

## 2018-08-26 ENCOUNTER — Encounter: Payer: Self-pay | Admitting: Hematology

## 2018-08-31 NOTE — Telephone Encounter (Signed)
Oral Oncology Patient Advocate Encounter  I confirmed with the patient that she did receive the medicine on 12/12. She started taking it on 12/14. She stated that she cut back to only taking the imodium when she takes the medicine because she got to constipated. I told her I would pass this information along.  [place] [facility] [place] Phone [phone] Fax [phone]

## 2018-09-07 ENCOUNTER — Other Ambulatory Visit: Payer: Self-pay | Admitting: Hematology

## 2018-09-07 ENCOUNTER — Telehealth: Payer: Self-pay | Admitting: *Deleted

## 2018-09-07 ENCOUNTER — Telehealth: Payer: Self-pay

## 2018-09-07 MED ORDER — DIPHENOXYLATE-ATROPINE 2.5-0.025 MG PO TABS: 1.0000 | ORAL_TABLET | Freq: Four times a day (QID) | ORAL | 1 refills | Status: DC | PRN

## 2018-09-07 MED FILL — DIPHENOXYLATE-ATROP 2.5-0.0: 2.5-0.025 | 5 days supply | Qty: 60 | Fill #0

## 2018-09-07 NOTE — Telephone Encounter (Signed)
Spoke with patient regarding her diarrhea episodes, stating this happened in a 2 hour window.  Informed her [name] sending in Lomotil to pharmacy of her choice, which is [facility].  She states she will probably stop the Nerlynx and not take any for the several days.    Patient has follow up on 12/30.   She verbalized an understanding

## 2018-09-07 NOTE — Telephone Encounter (Signed)
"  ST[facility] [phone]).  After hours team told me to call back at 8:00 am.  I've had eight to ten yellow, liquid, diarrhea stools since 3:00 am.  Four Imodium so far.  No fever.  No abdominal swelling, pain, tenderness but lots of loud noises.  Yesterday morning bowels moved normal.  However bowels don't move daily since 08-28-2018 use of Nerlynx.  Yesterday I ate eggs, PB/banana sandwich and chicken salad.  Drinking fluids well.  Nauseated Sunday no vomiting.  I live in [place] and could come in within an hour if needed."

## 2018-09-10 NOTE — Progress Notes (Signed)
[facility]   [phone] [phone]   Clinic Follow up Note   Patient Care Team: [name], MD as PCP - Haskell Riling, MD as Consulting Physician (General Surgery) Truitt Merle, MD as Consulting Physician (Hematology) [name], MD as Attending Physician (Radiation Oncology)  Date of Service:  09/13/2018  CHIEF COMPLAINT: Follow up of Breast cancer and endometrial cancer  SUMMARY OF ONCOLOGIC HISTORY: Oncology History   MSI high Cancer Staging Endometrial cancer [facility]) Staging form: Corpus Uteri - Adenosarcoma, AJCC 8th Edition - Pathologic stage from 06/09/2017: Stage IIIC (pT1a, pN1, cM0) - Signed by Truitt Merle, MD on 06/16/2017  Malignant neoplasm of upper-inner quadrant of right breast in female, estrogen receptor positive ([place]) Staging form: Breast, AJCC 8th Edition - Clinical stage from 05/06/2017: Stage IA (cT1c, cN0, cM0, G2, ER: Positive, PR: Negative, HER2: Positive) - Unsigned - Pathologic stage from 05/25/2017: Stage IIA (pT2, pN0, cM0, G2, ER: Positive, PR: Negative, HER2: Negative) - Signed by Truitt Merle, MD on 06/16/2017       Malignant neoplasm of upper-inner quadrant of right breast in female, estrogen receptor positive ([place])   04/27/2017 Initial Biopsy    Diagnosis Breast, right, needle core biopsy INVASIVE DUCTAL CARCINOMA, GRADE 2 Microscopic Comment The neoplasm has intracellular mucin and signet ring cell features. Immunostains shows these cells are positive for ER,GATA3, ck7 and GCDFP, negative for ck20, cdx2, TTF-1 and pax8, The immunostaining pattern supports the neoplasm is breast primary.    04/27/2017 Receptors her2    Estrogen Receptor: 70%, POSITIVE, MODERATE STAINING INTENSITY Progesterone Receptor: 0%, NEGATIVE Proliferation Marker Ki67: 30% HER2 - **POSITIVE** RATIO OF HER2/CEP17 SIGNALS 2.91 AVERAGE HER2 COPY NUMBER PER CELL 7.28    04/27/2017 Initial Diagnosis    Malignant neoplasm  of upper-inner quadrant of right breast in female, estrogen receptor positive ([place])    05/07/2017 Imaging    CT Chest W Contrast 05/07/17 IMPRESSION: Tiny well-defined fatty lesion on the pleura at the right lung base. The thinner slice collimation used for today's chest CT eliminates volume-averaging seen in the lesion on the prior exam and confirms that this is a diffusely fatty nodule. This is a benign finding and likely represents a tiny lipoma. No defect in the hemidiaphragm evident to suggest tiny diaphragmatic hernia. Pulmonary hamartoma a consideration although the lack of soft tissue components makes this less likely.    05/15/2017 Genetic Testing    Patient had genetic testing due to a personal history of breast cancer and uterine cancer as well as a family history of cancer. The Multi-Cancer panel was ordered. The Multi-Cancer Panel offered by [facility] includes sequencing and/or deletion duplication testing of the following 83 genes: ALK, APC, ATM, AXIN2,BAP1,  BARD1, BLM, BMPR1A, BRCA1, BRCA2, BRIP1, CASR, CDC73, CDH1, CDK4, CDKN1B, CDKN1C, CDKN2A (p14ARF), CDKN2A (p16INK4a), CEBPA, CHEK2, CTNNA1, DICER1, DIS3L2, EGFR (c.2369C>T, p.Thr790Met variant only), EPCAM (Deletion/duplication testing only), FH, FLCN, GATA2, GPC3, GREM1 (Promoter region deletion/duplication testing only), HOXB13 (c.251G>A, p.Gly84Glu), HRAS, KIT, MAX, MEN1, MET, MITF (c.952G>A, p.Glu318Lys variant only), MLH1, MSH2, MSH3, MSH6, MUTYH, NBN, NF1, NF2, NTHL1, PALB2, PDGFRA, PHOX2B, PMS2, POLD1, POLE, POT1, PRKAR1A, PTCH1, PTEN, RAD50, RAD51C, RAD51D, RB1, RECQL4, RET, RUNX1, SDHAF2, SDHA (sequence changes only), SDHB, SDHC, SDHD, SMAD4, SMARCA4, SMARCB1, SMARCE1, STK11, SUFU, TERC, TERT, TMEM127, TP53, TSC1, TSC2, VHL, WRN and WT1.   Results: No pathogenic mutations were identified. A VUS in the GATA2 gene c.1348G>A (p.Gly450Arg) was identified.  The date of this test report is 05/25/2017.  05/25/2017 Surgery     RIGHT BREAST LUMPECTOMY WITH RADIOACTIVE SEED AND  RIGHT AXILLARY SENTINEL LYMPH NODE BIOPSY and Pot placement by Dr. Excell Seltzer 05/25/17    05/25/2017 Pathology Results    Diagnosis 05/25/17 1. Breast, lumpectomy, right - INVASIVE DUCTAL CARCINOMA, GRADE II/III, SPANNING 2.2 CM. - DUCTAL CARCINOMA IN SITU, HIGH GRADE. - THE SURGICAL RESECTION MARGINS ARE NEGATIVE FOR CARCINOMA. - SEE ONCOLOGY TABLE BELOW. 2. Breast, excision, right additional superior margin - BENIGN FIBROADIPOSE TISSUE. - BENIGN SKELETAL MUSCLE. - SEE COMMENT. 3. Breast, excision, right additional lateral margin - BENIGN BREAST PARENCHYMA. - THERE IS NO EVIDENCE OF MALIGNANCY. - SEE COMMENT. 4. Breast, excision, chest wall margin - BENIGN FIBROADIPOSE TISSUE. - THERE IS NO EVIDENCE OF MALIGNANCY. - SEE COMMENT. 5. Lymph node, sentinel, biopsy, right axillary - THERE IS NO EVIDENCE OF CARCINOMA IN 1 OF 1 LYMPH NODE (0/1). 6. Lymph node, sentinel, biopsy, right axillary - THERE IS NO EVIDENCE OF CARCINOMA IN 1 OF 1 LYMPH NODE (0/1). 7. Lymph node, sentinel, biopsy, right axillary - THERE IS NO EVIDENCE OF CARCINOMA IN 1 OF 1 LYMPH NODE (0/1).    06/26/2017 PET scan    PET  IMPRESSION: 1. Postoperative findings both in the right breast and in the anatomic pelvis, with associated low-grade activity considered to be postoperative in nature. No hypermetabolic adenopathy or hypermetabolic lesions are identified to suggest active metastatic disease/malignancy. 2. Other imaging findings of potential clinical significance: Aortic Atherosclerosis (ICD10-I70.0). Sigmoid colon diverticulosis. Biapical pleuroparenchymal scarring in the lungs. Small amount of free pelvic fluid in the cul-de-sac, likely postoperative.     07/01/2017 - 10/14/2017 Chemotherapy    Adjuvant TCH with Onpro every 3 weeks for 6 cycles starting on 07/01/17, Changed Taxol to abraxane with cycle 4 due to drug rash reaction, followed by Herceptin every  3 weeks for 6 months     11/01/2017 - 12/08/2017 Radiation Therapy    Radiation therapy to her right breast with [name]    11/04/2017 - 06/2018 Chemotherapy    Maintenance Herceptin starting 11/04/17 and will comeplete her 12 months in 06/2018     12/23/2017 -  Anti-estrogen oral therapy    Adjuvant letrozole 2.5 mg daily started 12/23/17, switched to exemestane on 04/21/18 due to joint pain. Could not afford aromasin, started anastrozole 04/2018.     12/30/2017 Imaging    CT CAP W Contrast 12/30/17 IMPRESSION: Increased size of 1.1 cm aorto-caval retroperitoneal lymph node. This could be reactive due to interval hysterectomy, however metastatic carcinoma cannot be excluded. Consider continued attention on short-term follow-up CT, or PET-CT scan for further evaluation.  Mild hepatic steatosis.    01/25/2018 PET scan    IMPRESSION: 1. Enlarging hypermetabolic aortocaval lymph node is worrisome for metastatic disease. 2. Probable postoperative seroma in the medial right breast, with associated mild inflammatory hypermetabolism.    04/28/2018 Mammogram    Probably benign. Calcifications in the right breast most likely are dystrophic, related to previous surgery, and are probably benign.     04/28/2018 Imaging    DEXA Scan T Score -2.0    06/10/2018 Echocardiogram    06/10/2018 ECHO LV EF: 55% -   60%    08/28/2018 -  Chemotherapy    Neratinib 68m for 1 week then titrate up with 1 additional tablet weekly up to 12 mg starting 08/28/18     Endometrial adenocarcinoma ([place]   05/07/2017 Initial Diagnosis    Endometrial adenocarcinoma ([place]    06/09/2017 Surgery  XI ROBOTIC ASSISTED TOTAL LAPOROSCOPIC HYSTERECTOMY WITH BILATERAL SALPINGO OOPHORECTOMY and SENTINEL NODE BIOPSY by Dr. Denman George 06/09/17    06/09/2017 Pathology Results    Diagnosis 06/09/17 1. Lymph node, sentinel, biopsy, right external iliac - METASTATIC ADENOCARCINOMA IN ONE LYMPH NODE (1/1). 2. Lymph node, sentinel,  biopsy, left obturator - ONE BENIGN LYMPH NODE (0/1). 3. Lymph node, sentinel, biopsy, left external iliac - METASTATIC ADENOCARCINOMA IN ONE LYMPH NODE (1/1). 4. Uterus +/- tubes/ovaries, neoplastic ENDOMETRIUM: - ENDOMETRIAL ADENOCARCINOMA, 3.2 CM. - CARCINOMA INVADES INNER HALF OF MYOMETRIUM. 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 - CERVIX, BILATERAL FALLOPIAN TUBES AND BILATERAL OVARIES FREE OF TUMOR    08/18/2017 - 09/17/2017 Radiation Therapy    vaginal brachytherapy per 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    02/17/2018 -  Chemotherapy    Keytruda every 3 weeks starting 02/17/18      CURRENT THERAPY:  -Keytruda every 3 weeks starting 02/17/18 -Neratinib daily starting 08/28/18  -Anastrozole 2.73m daily starting 12/23/17  INTERVAL HISTORY:  VDRAVEN LAINEis here for a follow up. She presents to the clinic today by herself. She started Nerlynx on 12/14 and initial had constipation. Then when she got to 5 tablets and developed severe diarrhea. She had been taking Imodium. This stopped before her Lomotil. She had not gotten past 3 days of 5 tablets and has not restarted yet. She notes she has started exercising more. She is considering continue to work although she is on long-term disability. She notes having Foggy brain and forgetting things more often.  She notes dizziness after her morning medication with include her BP medication. She notes she tolerating Anastrozole with host flashes at night but this is manageable.      REVIEW OF SYSTEMS:   Constitutional: Denies fevers, chills or abnormal weight loss (+) hot flashes manageable Eyes: Denies blurriness of vision Ears, nose, mouth, throat, and face: Denies mucositis or sore throat Respiratory: Denies cough, dyspnea or wheezes Cardiovascular: Denies palpitation, chest discomfort or lower extremity  swelling Gastrointestinal:  Denies nausea, heartburn (+) severe diarrhea, improved  Skin: Denies abnormal skin rashes Lymphatics: Denies new lymphadenopathy or easy bruising Neurological:Denies numbness, tingling or new weaknesses Behavioral/Psych: Mood is stable, no new changes  All other systems were reviewed with the patient and are negative.  MEDICAL HISTORY:  Past Medical History:  Diagnosis Date  . Anemia    as a child.  . Arthritis   . Bilateral cataracts   . Bilateral leg cramps   . Cancer ([place]    skin  . Dyspnea   . Family history of bladder cancer   . Family history of colon cancer in father   . Fuchs' corneal dystrophy   . GERD (gastroesophageal reflux disease)   . Hearing loss    Right side 30%  . History of hiatal hernia    small size  . History of radiation therapy 08/21/17, 08/28/17,09/01/17, 09/11/17, 09/17/17   Vaginal cuff brachytherapy.   .Marland KitchenHistory of radiation therapy 11/11/17- 12/08/17   Right Breast treated to 40.05 Gy with 15 fx of 2.67 Gy and a boost of 10 Gy with 5 fx.   . Hyperlipidemia   . Hypertension   . IBS (irritable bowel syndrome)    hx of  . Malignant neoplasm of upper-inner quadrant of right female breast ([place] 04/2017  . NAFL (nonalcoholic  fatty liver)   . Obesity   . PONV (postoperative nausea and vomiting)   . 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 Uterine cancer ([place]) 04/2017   endometrial cancer  . Varices, gastric   . Vertigo     SURGICAL HISTORY: Past Surgical History:  Procedure Laterality Date  . BREAST BIOPSY Right 04/27/2017  . BREAST LUMPECTOMY WITH RADIOACTIVE SEED AND SENTINEL LYMPH NODE BIOPSY Right 05/25/2017   Procedure: RIGHT BREAST LUMPECTOMY WITH RADIOACTIVE SEED AND  RIGHT AXILLARY SENTINEL LYMPH NODE BIOPSY;  Surgeon: Excell Seltzer, MD;  Location: [facility];  Service: General;  Laterality: Right;  . CATARACT EXTRACTION, BILATERAL    . COLONOSCOPY    .  HYSTEROSCOPY W/D&C N/A 04/29/2017   Procedure: DILATATION AND CURETTAGE /HYSTEROSCOPY;  Surgeon: Linda Hedges, DO;  Location: [facility];  Service: Gynecology;  Laterality: N/A;  . PORTACATH PLACEMENT Left 05/25/2017   Procedure: INSERTION PORT-A-CATH;  Surgeon: Excell Seltzer, MD;  Location: [facility];  Service: General;  Laterality: Left;  . ROBOTIC ASSISTED TOTAL HYSTERECTOMY WITH BILATERAL SALPINGO OOPHERECTOMY N/A 06/09/2017   Procedure: XI ROBOTIC ASSISTED TOTAL LAPOROSCOPIC HYSTERECTOMY WITH BILATERAL SALPINGO OOPHORECTOMY;  Surgeon: Everitt Amber, MD;  Location: [facility];  Service: Gynecology;  Laterality: N/A;  . SENTINEL NODE BIOPSY N/A 06/09/2017   Procedure: SENTINEL NODE BIOPSY;  Surgeon: Everitt Amber, MD;  Location: [facility];  Service: Gynecology;  Laterality: N/A;    I have reviewed the social history and family history with the patient and they are unchanged from previous note.  ALLERGIES:  is allergic to ciprofloxacin; crestor [rosuvastatin calcium]; fosamax [alendronate sodium]; penicillins; and taxol [paclitaxel].  MEDICATIONS:  Current Outpatient Medications  Medication Sig Dispense Refill  . acetaminophen (TYLENOL) 500 MG tablet Take 500 mg by mouth every 6 (six) hours as needed for moderate pain.    Marland Kitchen anastrozole (ARIMIDEX) 1 MG tablet Take 1 tablet (1 mg total) by mouth daily. 30 tablet 3  . Ascorbic Acid (VITAMIN C) 1000 MG tablet Take 1,000 mg by mouth daily.    Marland Kitchen b complex vitamins capsule Take 1 capsule by mouth daily.    . Budesonide ER 9 MG TB24 Take 9 mg by mouth daily. 30 tablet 2  . Calcium Carbonate-Vitamin D3 (CALCIUM 600-D) 600-400 MG-UNIT TABS Take 1-2 tablets by mouth daily.    . diphenhydrAMINE (BENADRYL) 25 MG tablet Take 25-50 mg by mouth every 6 (six) hours as needed for sleep.    . diphenoxylate-atropine (LOMOTIL) 2.5-0.025 MG tablet Take 1-2 tablets by mouth 4 (four) times daily as needed for diarrhea or loose stools. 60 tablet 1  .  ibandronate (BONIVA) 150 MG tablet Take in the morning, once a month,  with a full glass of water,on an empty stomach,do not take anything else by mouth or lie down for the next 30 min. 3 tablet 3  . lidocaine-prilocaine (EMLA) cream   2  . loperamide (IMODIUM) 2 MG capsule Take 2 capsules (4 mg total) by mouth 3 (three) times daily. May take additional 2-72m as needed for breakthrough diarrhea. 200 capsule 2  . meclizine (ANTIVERT) 25 MG tablet Take 1-2 tablets (25-50 mg total) by mouth 3 (three) times daily as needed for dizziness. (Patient taking differently: Take 25-50 mg by mouth 3 (three) times daily as needed for dizziness (depends on dizziness if takes 1-2 tablets). ) 30 tablet 0  . Multiple Vitamin (MULTIVITAMIN) capsule Take 1 capsule by mouth daily.      .Marland Kitchen  olmesartan-hydrochlorothiazide (BENICAR HCT) 20-12.5 MG tablet Take 1 tablet by mouth daily. 90 tablet 2  . Pitavastatin Calcium (LIVALO) 2 MG TABS Take 1.5 tablets (3 mg total) by mouth every evening. Takes 1.5 tablet 135 tablet 3  . prednisoLONE acetate (PRED FORTE) 1 % ophthalmic suspension Place 1 drop into the left eye 3 (three) times daily.    . sodium chloride (MURO 128) 5 % ophthalmic solution Place 2 drops into both eyes daily.    Marland Kitchen triamcinolone cream (KENALOG) 0.1 % APPLY TO ITCHY SPOTS ONCE DAILY  1  . Neratinib Maleate (NERLYNX) 40 MG tablet Week1: Take 4 tabs (125m) by mouth once daily. [id number] Take 5 tab (2068m daily. WeOIZT2 on: Take 6 tab (2405mdaily. Take with food. (Patient not taking: Reported on 09/13/2018) 160 tablet 0   No current facility-administered medications for this visit.    Facility-Administered Medications Ordered in Other Visits  Medication Dose Route Frequency Provider Last Rate Last Dose  . sodium chloride flush (NS) 0.9 % injection 10 mL  10 mL Intracatheter PRN FenTruitt MerleD   10 mL at 08/25/18 1111    PHYSICAL EXAMINATION: ECOG PERFORMANCE STATUS: 1 - Symptomatic but completely  ambulatory  Vitals:   09/13/18 1431 09/13/18 1437  BP: 107/77 107/77  Pulse: 89 89  Resp: 20 20  Temp: 98.6 F (37 C) 98.6 F (37 C)  SpO2: 96% 96%   Filed Weights   09/13/18 1431 09/13/18 1437  Weight: 189 lb (85.7 kg) 189 lb (85.7 kg)    GENERAL:alert, no distress and comfortable SKIN: skin color, texture, turgor are normal, no rashes or significant lesions EYES: normal, Conjunctiva are pink and non-injected, sclera clear OROPHARYNX:no exudate, no erythema and lips, buccal mucosa, and tongue normal  NECK: supple, thyroid normal size, non-tender, without nodularity LYMPH:  no palpable lymphadenopathy in the cervical, axillary or inguinal LUNGS: clear to auscultation and percussion with normal breathing effort HEART: regular rate & rhythm and no murmurs and no lower extremity edema ABDOMEN:abdomen soft, non-tender and normal bowel sounds Musculoskeletal:no cyanosis of digits and no clubbing  NEURO: alert & oriented x 3 with fluent speech, no focal motor/sensory deficits  LABORATORY DATA:  I have reviewed the data as listed CBC Latest Ref Rng & Units 09/13/2018 08/25/2018 08/04/2018  WBC 4.0 - 10.5 K/uL 5.7 3.7(L) 3.6(L)  Hemoglobin 12.0 - 15.0 g/dL 13.4 13.2 13.0  Hematocrit 36.0 - 46.0 % 38.0 38.0 36.6  Platelets 150 - 400 K/uL 161 143(L) 140(L)     CMP Latest Ref Rng & Units 09/13/2018 08/25/2018 08/04/2018  Glucose 70 - 99 mg/dL 146(H) 120(H) 147(H)  BUN 8 - 23 mg/dL _0 Creatinine 0.44 - 1.00 mg/dL 0.84 0.80 0.79  Sodium 135 - 145 mmol/L 136 138 135  Potassium 3.5 - 5.1 mmol/L 3.8 3.4(L) 3.6  Chloride 98 - 111 mmol/L 101 102 100  CO2 22 - 32 mmol/L _1 Calcium 8.9 - 10.3 mg/dL 8.8(L) 9.1 9.0  Total Protein 6.5 - 8.1 g/dL 6.8 7.1 6.9  Total Bilirubin 0.3 - 1.2 mg/dL 0.6 0.7 0.7  Alkaline Phos 38 - 126 U/L 50 55 41  AST 15 - 41 U/L 46(H) 82(H) 69(H)  ALT 0 - 44 U/L 81(H) 122(H) 102(H)      RADIOGRAPHIC STUDIES: I have personally reviewed the  radiological images as listed and agreed with the findings in the report. No results found.   ASSESSMENT & PLAN:  Dawn [name]  a 67 y.o. female with   1. 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 She is s/p right breast lumpectomy, adjuvant chemotherapy, radiation and currently on Anastrozole.  -She is tolerating well with mild joint stiffness. I encouraged her to remain active.  -She is currently on Neratinib since 08/28/18 and developed severe diarrhea on week two with 5 tabs daily. This was controlled with imodium but has not restarted Neratinib. She is willing to retry.  -She will continue at 5 tabs for 1 week and then increase to 6 tabs starting after 09/15/18. I recommend she take Imodium in the morning at noon and in the evening if needed each day. If not enough she will contact clinic. She has Lomotil if needed.  -I dicussed the clinical trial for her "Foggy brian". She declined for now and will reconsider later on if needed.  -Labs reviewed, Glucose at 146, mildly elevated AST/ALT, CBC overall WNL and adequate to proceed with Neratinib.  -Continue Anastrozole -Next scan in 10/2018 -F/u in 3 weeks    2. Endometrial adenocarcinomawith lymph node metastasis, pT1apN1M0, FIGO stage IIIc, MSI-H, probable RP node recurrence in 12/2017  -Diagnosed in 04/2017. She is S/p total hysterectomy with BSO and adjuvant radiation. She completed Taxol/carbo (taxol changed to abraxane)every 3 weeks x6 cycles on 10/14/17 and adjuvant vaginal brachytherapyfrom12/4/18-09/17/17. -She is currently on Keytruda every 3 weekssince 02/17/2018,tolerating very well.  -will continue Keytruda, next scan in March 2020 -Will proceed with [facility] today and continue every 3 weeks   3. Genetics -She was previously referred to genetics and results were negative   4. HTN -F/u with PCP -BP normal lately    5. Transaminitis  -Secondary to fatty liver disease -We monitored her liver functions closely while on chemo, much improved lately.   6.Osteopenia -DEXA scan in 05/2018 revealed T score of -2.0. -She is on monthly boniva, prescribed by her PCP, since 02/2018.  -Continue Vitamin D and Calcium supplements. -Check her Vitamin D level every 6 months.   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  -Continue B complex. She would like to hold off on Gabapentin at this time. -Her neuropathy mainly remains in feet, stable   8. Fatigue  -She currently works part-time 16hr/week -Stable fatigue, with adequate function.    Follow-up: -Restart Neratinib 5 tbs daily after 09/15/18 and increase to 6 tabs daily one week after if she tolerates well. OK to stop if she develops uncontrolled diarrhea again  -Continue Anastrozole  -f/u in 3 weeks     No problem-specific Assessment & Plan notes found for this encounter.   No orders of the defined types were placed in this encounter.  All questions were answered. The patient knows to call the clinic with any problems, questions or concerns. No barriers to learning was detected. I spent 20 minutes counseling the patient face to face. The total time spent in the appointment was 25 minutes and more than 50% was on counseling and review of test results     Truitt Merle, MD 09/13/2018   I, Joslyn Devon, am acting as scribe for Truitt Merle, MD.   I have reviewed the above documentation for accuracy and completeness, and I agree with the above.

## 2018-09-13 ENCOUNTER — Inpatient Hospital Stay: Payer: Medicare Other

## 2018-09-13 ENCOUNTER — Inpatient Hospital Stay (HOSPITAL_BASED_OUTPATIENT_CLINIC_OR_DEPARTMENT_OTHER): Payer: Medicare Other | Admitting: Hematology

## 2018-09-13 VITALS — BP 107/77 | HR 89 | Temp 98.6°F | Resp 20 | Ht 63.0 in | Wt 189.0 lb

## 2018-09-13 DIAGNOSIS — M810 Age-related osteoporosis without current pathological fracture: Secondary | ICD-10-CM

## 2018-09-13 DIAGNOSIS — C50211 Malignant neoplasm of upper-inner quadrant of right female breast: Secondary | ICD-10-CM

## 2018-09-13 DIAGNOSIS — G620 Drug-induced polyneuropathy: Secondary | ICD-10-CM

## 2018-09-13 DIAGNOSIS — Z170 Estrogen receptor positive status [ER+]: Secondary | ICD-10-CM

## 2018-09-13 DIAGNOSIS — C541 Malignant neoplasm of endometrium: Secondary | ICD-10-CM | POA: Diagnosis not present

## 2018-09-13 DIAGNOSIS — Z923 Personal history of irradiation: Secondary | ICD-10-CM | POA: Diagnosis not present

## 2018-09-13 DIAGNOSIS — M8589 Other specified disorders of bone density and structure, multiple sites: Secondary | ICD-10-CM

## 2018-09-13 DIAGNOSIS — I10 Essential (primary) hypertension: Secondary | ICD-10-CM

## 2018-09-13 DIAGNOSIS — Z95828 Presence of other vascular implants and grafts: Secondary | ICD-10-CM

## 2018-09-13 DIAGNOSIS — Z5112 Encounter for antineoplastic immunotherapy: Secondary | ICD-10-CM | POA: Diagnosis not present

## 2018-09-13 DIAGNOSIS — C779 Secondary and unspecified malignant neoplasm of lymph node, unspecified: Secondary | ICD-10-CM | POA: Diagnosis not present

## 2018-09-13 LAB — COMPREHENSIVE METABOLIC PANEL WITH GFR
ALT: 81 U/L — ABNORMAL HIGH (ref 0–44)
AST: 46 U/L — ABNORMAL HIGH (ref 15–41)
Albumin: 3.5 g/dL (ref 3.5–5.0)
Alkaline Phosphatase: 50 U/L (ref 38–126)
Anion gap: 9 (ref 5–15)
BUN: 11 mg/dL (ref 8–23)
CO2: 26 mmol/L (ref 22–32)
Calcium: 8.8 mg/dL — ABNORMAL LOW (ref 8.9–10.3)
Chloride: 101 mmol/L (ref 98–111)
Creatinine, Ser: 0.84 mg/dL (ref 0.44–1.00)
GFR calc Af Amer: >60 mL/min (ref >60)
GFR calc non Af Amer: >60 mL/min (ref >60)
Glucose, Bld: 146 mg/dL — ABNORMAL HIGH (ref 70–99)
Potassium: 3.8 mmol/L (ref 3.5–5.1)
Sodium: 136 mmol/L (ref 135–145)
Total Bilirubin: 0.6 mg/dL (ref 0.3–1.2)
Total Protein: 6.8 g/dL (ref 6.5–8.1)

## 2018-09-13 LAB — CBC WITH DIFFERENTIAL/PLATELET
Abs Immature Granulocytes: 0.01 10*3/uL (ref 0.00–0.07)
Basophils Absolute: 0 10*3/uL (ref 0.0–0.1)
Basophils Relative: 1 %
Eosinophils Absolute: 0.1 10*3/uL (ref 0.0–0.5)
Eosinophils Relative: 2 %
HCT: 38 % (ref 36.0–46.0)
Hemoglobin: 13.4 g/dL (ref 12.0–15.0)
Immature Granulocytes: 0 %
Lymphocytes Relative: 42 %
Lymphs Abs: 2.4 10*3/uL (ref 0.7–4.0)
MCH: 35.4 pg — ABNORMAL HIGH (ref 26.0–34.0)
MCHC: 35.3 g/dL (ref 30.0–36.0)
MCV: 100.3 fL — ABNORMAL HIGH (ref 80.0–100.0)
Monocytes Absolute: 0.6 10*3/uL (ref 0.1–1.0)
Monocytes Relative: 10 %
NRBC: 0 % (ref 0.0–0.2)
Neutro Abs: 2.6 10*3/uL (ref 1.7–7.7)
Neutrophils Relative %: 45 %
Platelets: 161 10*3/uL (ref 150–400)
RBC: 3.79 MIL/uL — AB (ref 3.87–5.11)
RDW: 11.9 % (ref 11.5–15.5)
WBC: 5.7 10*3/uL (ref 4.0–10.5)

## 2018-09-13 MED ORDER — SODIUM CHLORIDE 0.9% FLUSH
10.0000 mL | INTRAVENOUS | Status: DC | PRN
  Filled 2018-09-13: qty 10

## 2018-09-14 ENCOUNTER — Telehealth: Payer: Self-pay | Admitting: Hematology

## 2018-09-14 ENCOUNTER — Encounter: Payer: Self-pay | Admitting: Hematology

## 2018-09-14 ENCOUNTER — Inpatient Hospital Stay: Payer: Medicare Other

## 2018-09-14 VITALS — BP 142/86 | HR 78 | Temp 98.6°F | Resp 18

## 2018-09-14 DIAGNOSIS — C541 Malignant neoplasm of endometrium: Secondary | ICD-10-CM

## 2018-09-14 DIAGNOSIS — G620 Drug-induced polyneuropathy: Secondary | ICD-10-CM | POA: Diagnosis not present

## 2018-09-14 DIAGNOSIS — M8589 Other specified disorders of bone density and structure, multiple sites: Secondary | ICD-10-CM | POA: Diagnosis not present

## 2018-09-14 DIAGNOSIS — C779 Secondary and unspecified malignant neoplasm of lymph node, unspecified: Secondary | ICD-10-CM | POA: Diagnosis not present

## 2018-09-14 DIAGNOSIS — Z5112 Encounter for antineoplastic immunotherapy: Secondary | ICD-10-CM | POA: Diagnosis not present

## 2018-09-14 DIAGNOSIS — C50211 Malignant neoplasm of upper-inner quadrant of right female breast: Secondary | ICD-10-CM | POA: Diagnosis not present

## 2018-09-14 MED ORDER — SODIUM CHLORIDE 0.9% FLUSH
10.0000 mL | INTRAVENOUS | Status: DC | PRN
  Administered 2018-09-14: 10 mL
  Filled 2018-09-14: qty 10

## 2018-09-14 MED ORDER — SODIUM CHLORIDE 0.9 % IV SOLN
Freq: Once | INTRAVENOUS | Status: AC
  Administered 2018-09-14: 10:00:00 via INTRAVENOUS
  Filled 2018-09-14: qty 250

## 2018-09-14 MED ORDER — SODIUM CHLORIDE 0.9 % IV SOLN
200.0000 mg | Freq: Once | INTRAVENOUS | Status: AC
  Administered 2018-09-14: 200 mg via INTRAVENOUS
  Filled 2018-09-14: qty 8

## 2018-09-14 MED ORDER — HEPARIN SOD (PORK) LOCK FLUSH 100 UNIT/ML IV SOLN
500.0000 [IU] | Freq: Once | INTRAVENOUS | Status: AC | PRN
  Administered 2018-09-14: 500 [IU]
  Filled 2018-09-14: qty 5

## 2018-09-14 NOTE — Telephone Encounter (Signed)
No los per 12/30. °

## 2018-09-14 NOTE — Patient Instructions (Signed)
[facility] Discharge Instructions for Patients Receiving Chemotherapy  Today you received the following chemotherapy agent: Keytruda.  To help prevent nausea and vomiting after your treatment, we encourage you to take your nausea medication as directed.   If you develop nausea and vomiting that is not controlled by your nausea medication, call the clinic.   BELOW ARE SYMPTOMS THAT SHOULD BE REPORTED IMMEDIATELY:  *FEVER GREATER THAN 100.5 F  *CHILLS WITH OR WITHOUT FEVER  NAUSEA AND VOMITING THAT IS NOT CONTROLLED WITH YOUR NAUSEA MEDICATION  *UNUSUAL SHORTNESS OF BREATH  *UNUSUAL BRUISING OR BLEEDING  TENDERNESS IN MOUTH AND THROAT WITH OR WITHOUT PRESENCE OF ULCERS  *URINARY PROBLEMS  *BOWEL PROBLEMS  UNUSUAL RASH Items with * indicate a potential emergency and should be followed up as soon as possible.  Feel free to call the clinic should you have any questions or concerns. The clinic phone number is [phone].  Please show the CHEMO ALERT CARD at check-in to the Emergency Department and triage nurse.   

## 2018-09-20 ENCOUNTER — Other Ambulatory Visit: Payer: Self-pay | Admitting: Hematology

## 2018-09-20 MED ORDER — NERATINIB MALEATE 40 MG PO TABS: 240.0000 mg | ORAL_TABLET | Freq: Every day | ORAL | 0 refills | Status: DC

## 2018-09-21 DIAGNOSIS — C50911 Malignant neoplasm of unspecified site of right female breast: Secondary | ICD-10-CM | POA: Diagnosis not present

## 2018-09-21 DIAGNOSIS — Z170 Estrogen receptor positive status [ER+]: Secondary | ICD-10-CM | POA: Diagnosis not present

## 2018-09-24 ENCOUNTER — Telehealth: Payer: Self-pay

## 2018-09-24 NOTE — Telephone Encounter (Signed)
Faxed signed order for mammogram to [facility], sent to HIM for scanning to chart.

## 2018-09-27 MED FILL — OLMESARTAN-HCTZ 20-12.5 MG: 20-12.5 | 90 days supply | Qty: 90 | Fill #1

## 2018-09-29 NOTE — Progress Notes (Signed)
[place]   [phone] [phone]   Clinic Follow up Note   Patient Care Team: [name], MD as PCP - Haskell Riling, MD as Consulting Physician (General Surgery) Truitt Merle, MD as Consulting Physician (Hematology) [name], MD as Attending Physician (Radiation Oncology) 10/05/2018  CHIEF COMPLAINT: F/u on breast and endometrial cancers   SUMMARY OF ONCOLOGIC HISTORY: Oncology History   MSI high Cancer Staging Endometrial cancer [facility]) Staging form: Corpus Uteri - Adenosarcoma, AJCC 8th Edition - Pathologic stage from 06/09/2017: Stage IIIC (pT1a, pN1, cM0) - Signed by Truitt Merle, MD on 06/16/2017  Malignant neoplasm of upper-inner quadrant of right breast in female, estrogen receptor positive ([place]) Staging form: Breast, AJCC 8th Edition - Clinical stage from 05/06/2017: Stage IA (cT1c, cN0, cM0, G2, ER: Positive, PR: Negative, HER2: Positive) - Unsigned - Pathologic stage from 05/25/2017: Stage IIA (pT2, pN0, cM0, G2, ER: Positive, PR: Negative, HER2: Negative) - Signed by Truitt Merle, MD on 06/16/2017       Malignant neoplasm of upper-inner quadrant of right breast in female, estrogen receptor positive ([place])   04/27/2017 Initial Biopsy    Diagnosis Breast, right, needle core biopsy INVASIVE DUCTAL CARCINOMA, GRADE 2 Microscopic Comment The neoplasm has intracellular mucin and signet ring cell features. Immunostains shows these cells are positive for ER,GATA3, ck7 and GCDFP, negative for ck20, cdx2, TTF-1 and pax8, The immunostaining pattern supports the neoplasm is breast primary.    04/27/2017 Receptors her2    Estrogen Receptor: 70%, POSITIVE, MODERATE STAINING INTENSITY Progesterone Receptor: 0%, NEGATIVE Proliferation Marker Ki67: 30% HER2 - **POSITIVE** RATIO OF HER2/CEP17 SIGNALS 2.91 AVERAGE HER2 COPY NUMBER PER CELL 7.28    04/27/2017 Initial Diagnosis    Malignant neoplasm of upper-inner quadrant of  right breast in female, estrogen receptor positive ([place])    05/07/2017 Imaging    CT Chest W Contrast 05/07/17 IMPRESSION: Tiny well-defined fatty lesion on the pleura at the right lung base. The thinner slice collimation used for today's chest CT eliminates volume-averaging seen in the lesion on the prior exam and confirms that this is a diffusely fatty nodule. This is a benign finding and likely represents a tiny lipoma. No defect in the hemidiaphragm evident to suggest tiny diaphragmatic hernia. Pulmonary hamartoma a consideration although the lack of soft tissue components makes this less likely.    05/15/2017 Genetic Testing    Patient had genetic testing due to a personal history of breast cancer and uterine cancer as well as a family history of cancer. The Multi-Cancer panel was ordered. The Multi-Cancer Panel offered by [facility] includes sequencing and/or deletion duplication testing of the following 83 genes: ALK, APC, ATM, AXIN2,BAP1,  BARD1, BLM, BMPR1A, BRCA1, BRCA2, BRIP1, CASR, CDC73, CDH1, CDK4, CDKN1B, CDKN1C, CDKN2A (p14ARF), CDKN2A (p16INK4a), CEBPA, CHEK2, CTNNA1, DICER1, DIS3L2, EGFR (c.2369C>T, p.Thr790Met variant only), EPCAM (Deletion/duplication testing only), FH, FLCN, GATA2, GPC3, GREM1 (Promoter region deletion/duplication testing only), HOXB13 (c.251G>A, p.Gly84Glu), HRAS, KIT, MAX, MEN1, MET, MITF (c.952G>A, p.Glu318Lys variant only), MLH1, MSH2, MSH3, MSH6, MUTYH, NBN, NF1, NF2, NTHL1, PALB2, PDGFRA, PHOX2B, PMS2, POLD1, POLE, POT1, PRKAR1A, PTCH1, PTEN, RAD50, RAD51C, RAD51D, RB1, RECQL4, RET, RUNX1, SDHAF2, SDHA (sequence changes only), SDHB, SDHC, SDHD, SMAD4, SMARCA4, SMARCB1, SMARCE1, STK11, SUFU, TERC, TERT, TMEM127, TP53, TSC1, TSC2, VHL, WRN and WT1.   Results: No pathogenic mutations were identified. A VUS in the GATA2 gene c.1348G>A (p.Gly450Arg) was identified.  The date of this test report is 05/25/2017.      05/25/2017 Surgery  RIGHT BREAST LUMPECTOMY WITH  RADIOACTIVE SEED AND  RIGHT AXILLARY SENTINEL LYMPH NODE BIOPSY and Pot placement by Dr. Excell Seltzer 05/25/17    05/25/2017 Pathology Results    Diagnosis 05/25/17 1. Breast, lumpectomy, right - INVASIVE DUCTAL CARCINOMA, GRADE II/III, SPANNING 2.2 CM. - DUCTAL CARCINOMA IN SITU, HIGH GRADE. - THE SURGICAL RESECTION MARGINS ARE NEGATIVE FOR CARCINOMA. - SEE ONCOLOGY TABLE BELOW. 2. Breast, excision, right additional superior margin - BENIGN FIBROADIPOSE TISSUE. - BENIGN SKELETAL MUSCLE. - SEE COMMENT. 3. Breast, excision, right additional lateral margin - BENIGN BREAST PARENCHYMA. - THERE IS NO EVIDENCE OF MALIGNANCY. - SEE COMMENT. 4. Breast, excision, chest wall margin - BENIGN FIBROADIPOSE TISSUE. - THERE IS NO EVIDENCE OF MALIGNANCY. - SEE COMMENT. 5. Lymph node, sentinel, biopsy, right axillary - THERE IS NO EVIDENCE OF CARCINOMA IN 1 OF 1 LYMPH NODE (0/1). 6. Lymph node, sentinel, biopsy, right axillary - THERE IS NO EVIDENCE OF CARCINOMA IN 1 OF 1 LYMPH NODE (0/1). 7. Lymph node, sentinel, biopsy, right axillary - THERE IS NO EVIDENCE OF CARCINOMA IN 1 OF 1 LYMPH NODE (0/1).    06/26/2017 PET scan    PET  IMPRESSION: 1. Postoperative findings both in the right breast and in the anatomic pelvis, with associated low-grade activity considered to be postoperative in nature. No hypermetabolic adenopathy or hypermetabolic lesions are identified to suggest active metastatic disease/malignancy. 2. Other imaging findings of potential clinical significance: Aortic Atherosclerosis (ICD10-I70.0). Sigmoid colon diverticulosis. Biapical pleuroparenchymal scarring in the lungs. Small amount of free pelvic fluid in the cul-de-sac, likely postoperative.     07/01/2017 - 10/14/2017 Chemotherapy    Adjuvant TCH with Onpro every 3 weeks for 6 cycles starting on 07/01/17, Changed Taxol to abraxane with cycle 4 due to drug rash reaction, followed by Herceptin every 3 weeks for 6 months      11/01/2017 - 12/08/2017 Radiation Therapy    Radiation therapy to her right breast with [name]    11/04/2017 - 06/2018 Chemotherapy    Maintenance Herceptin starting 11/04/17 and will comeplete her 12 months in 06/2018     12/23/2017 -  Anti-estrogen oral therapy    Adjuvant letrozole 2.5 mg daily started 12/23/17, switched to exemestane on 04/21/18 due to joint pain. Could not afford aromasin, started anastrozole 04/2018.     12/30/2017 Imaging    CT CAP W Contrast 12/30/17 IMPRESSION: Increased size of 1.1 cm aorto-caval retroperitoneal lymph node. This could be reactive due to interval hysterectomy, however metastatic carcinoma cannot be excluded. Consider continued attention on short-term follow-up CT, or PET-CT scan for further evaluation.  Mild hepatic steatosis.    01/25/2018 PET scan    IMPRESSION: 1. Enlarging hypermetabolic aortocaval lymph node is worrisome for metastatic disease. 2. Probable postoperative seroma in the medial right breast, with associated mild inflammatory hypermetabolism.    04/28/2018 Mammogram    Probably benign. Calcifications in the right breast most likely are dystrophic, related to previous surgery, and are probably benign.     04/28/2018 Imaging    DEXA Scan T Score -2.0    06/10/2018 Echocardiogram    06/10/2018 ECHO LV EF: 55% -   60%    08/28/2018 -  Chemotherapy    Neratinib 50m for 1 week then titrate up with 1 additional tablet weekly up to 12 mg starting 08/28/18     Endometrial adenocarcinoma ([place]   05/07/2017 Initial Diagnosis    Endometrial adenocarcinoma ([facility]    06/09/2017 Surgery    XI ROBOTIC ASSISTED TOTAL  LAPOROSCOPIC HYSTERECTOMY WITH BILATERAL SALPINGO OOPHORECTOMY and SENTINEL NODE BIOPSY by Dr. Denman George 06/09/17    06/09/2017 Pathology Results    Diagnosis 06/09/17 1. Lymph node, sentinel, biopsy, right external iliac - METASTATIC ADENOCARCINOMA IN ONE LYMPH NODE (1/1). 2. Lymph node, sentinel, biopsy, left obturator - ONE  BENIGN LYMPH NODE (0/1). 3. Lymph node, sentinel, biopsy, left external iliac - METASTATIC ADENOCARCINOMA IN ONE LYMPH NODE (1/1). 4. Uterus +/- tubes/ovaries, neoplastic ENDOMETRIUM: - ENDOMETRIAL ADENOCARCINOMA, 3.2 CM. - CARCINOMA INVADES INNER HALF OF MYOMETRIUM. 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 - CERVIX, BILATERAL FALLOPIAN TUBES AND BILATERAL OVARIES FREE OF TUMOR    08/18/2017 - 09/17/2017 Radiation Therapy    vaginal brachytherapy per 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    02/17/2018 -  Chemotherapy    Keytruda every 3 weeks starting 02/17/18     CURRENT THERAPY  -Keytruda every 3 weeks starting 02/17/18 -Neratinib daily starting 08/28/18  -Anastrozole 2.28m daily starting 12/23/17   INTERVAL HISTORY: VDEVORAH GIVHANis a 68y.o. female who is here for follow-up. Today, she is here with her husband. She is doing well and is recovering from an URTI she had 3 weeks ago. She still has a productive cough. She has sinus infections. She denies fever or chest pain. She still experiences mild neuropathy especially in her feet when she gets tired. She works at [employer] tries to stay active.  Pertinent positives and negatives of review of systems are listed and detailed within the above HPI.  REVIEW OF SYSTEMS:   Constitutional: Denies fevers, chills or abnormal weight loss Eyes: Denies blurriness of vision Ears, nose, mouth, throat, and face: Denies mucositis or sore throat Respiratory: Denies dyspnea or wheezes (+) productive cough, recovering from a URTI Cardiovascular: Denies palpitation, chest discomfort or lower extremity swelling Gastrointestinal:  Denies nausea, heartburn or change in bowel habits Skin: Denies abnormal skin rashes Lymphatics: Denies new lymphadenopathy or easy bruising Neurological:(+) numbness in feet especially when tired   Behavioral/Psych: Mood is stable, no new changes  All other systems were reviewed with the patient and are negative.  MEDICAL HISTORY:  Past Medical History:  Diagnosis Date  . Anemia    as a child.  . Arthritis   . Bilateral cataracts   . Bilateral leg cramps   . Cancer ([facility]    skin  . Dyspnea   . Family history of bladder cancer   . Family history of colon cancer in father   . Fuchs' corneal dystrophy   . GERD (gastroesophageal reflux disease)   . Hearing loss    Right side 30%  . History of hiatal hernia    small size  . History of radiation therapy 08/21/17, 08/28/17,09/01/17, 09/11/17, 09/17/17   Vaginal cuff brachytherapy.   .Marland KitchenHistory of radiation therapy 11/11/17- 12/08/17   Right Breast treated to 40.05 Gy with 15 fx of 2.67 Gy and a boost of 10 Gy with 5 fx.   . Hyperlipidemia   . Hypertension   . IBS (irritable bowel syndrome)    hx of  . Malignant neoplasm of upper-inner quadrant of right female breast ([place] 04/2017  . NAFL (nonalcoholic fatty liver)   . Obesity   . PONV (postoperative nausea and vomiting)   . 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 Uterine cancer (  [place]) 04/2017   endometrial cancer  . Varices, gastric   . Vertigo     SURGICAL HISTORY: Past Surgical History:  Procedure Laterality Date  . BREAST BIOPSY Right 04/27/2017  . BREAST LUMPECTOMY WITH RADIOACTIVE SEED AND SENTINEL LYMPH NODE BIOPSY Right 05/25/2017   Procedure: RIGHT BREAST LUMPECTOMY WITH RADIOACTIVE SEED AND  RIGHT AXILLARY SENTINEL LYMPH NODE BIOPSY;  Surgeon: Excell Seltzer, MD;  Location: [facility];  Service: General;  Laterality: Right;  . CATARACT EXTRACTION, BILATERAL    . COLONOSCOPY    . HYSTEROSCOPY W/D&C N/A 04/29/2017   Procedure: DILATATION AND CURETTAGE /HYSTEROSCOPY;  Surgeon: Linda Hedges, DO;  Location: [facility];  Service: Gynecology;  Laterality: N/A;  . PORTACATH PLACEMENT Left 05/25/2017   Procedure: INSERTION  PORT-A-CATH;  Surgeon: Excell Seltzer, MD;  Location: [facility];  Service: General;  Laterality: Left;  . ROBOTIC ASSISTED TOTAL HYSTERECTOMY WITH BILATERAL SALPINGO OOPHERECTOMY N/A 06/09/2017   Procedure: XI ROBOTIC ASSISTED TOTAL LAPOROSCOPIC HYSTERECTOMY WITH BILATERAL SALPINGO OOPHORECTOMY;  Surgeon: Everitt Amber, MD;  Location: [facility];  Service: Gynecology;  Laterality: N/A;  . SENTINEL NODE BIOPSY N/A 06/09/2017   Procedure: SENTINEL NODE BIOPSY;  Surgeon: Everitt Amber, MD;  Location: [facility];  Service: Gynecology;  Laterality: N/A;    I have reviewed the social history and family history with the patient and they are unchanged from previous note.  ALLERGIES:  is allergic to ciprofloxacin; crestor [rosuvastatin calcium]; fosamax [alendronate sodium]; penicillins; and taxol [paclitaxel].  MEDICATIONS:  Current Outpatient Medications  Medication Sig Dispense Refill  . acetaminophen (TYLENOL) 500 MG tablet Take 500 mg by mouth every 6 (six) hours as needed for moderate pain.    Marland Kitchen anastrozole (ARIMIDEX) 1 MG tablet Take 1 tablet (1 mg total) by mouth daily. 30 tablet 3  . Ascorbic Acid (VITAMIN C) 1000 MG tablet Take 1,000 mg by mouth daily.    Marland Kitchen b complex vitamins capsule Take 1 capsule by mouth daily.    . Budesonide ER 9 MG TB24 Take 9 mg by mouth daily. 30 tablet 2  . Calcium Carbonate-Vitamin D3 (CALCIUM 600-D) 600-400 MG-UNIT TABS Take 1-2 tablets by mouth daily.    . diphenhydrAMINE (BENADRYL) 25 MG tablet Take 25-50 mg by mouth every 6 (six) hours as needed for sleep.    . diphenoxylate-atropine (LOMOTIL) 2.5-0.025 MG tablet Take 1-2 tablets by mouth 4 (four) times daily as needed for diarrhea or loose stools. 60 tablet 1  . ibandronate (BONIVA) 150 MG tablet Take in the morning, once a month,  with a full glass of water,on an empty stomach,do not take anything else by mouth or lie down for the next 30 min. 3 tablet 3  . lidocaine-prilocaine (EMLA) cream   2  .  loperamide (IMODIUM) 2 MG capsule Take 2 capsules (4 mg total) by mouth 3 (three) times daily. May take additional 2-79m as needed for breakthrough diarrhea. 200 capsule 2  . meclizine (ANTIVERT) 25 MG tablet Take 1-2 tablets (25-50 mg total) by mouth 3 (three) times daily as needed for dizziness. (Patient taking differently: Take 25-50 mg by mouth 3 (three) times daily as needed for dizziness (depends on dizziness if takes 1-2 tablets). ) 30 tablet 0  . Multiple Vitamin (MULTIVITAMIN) capsule Take 1 capsule by mouth daily.      . Neratinib Maleate (NERLYNX) 40 MG tablet Take 6 tablets (240 mg total) by mouth daily. Take with food. 180 tablet 0  . olmesartan-hydrochlorothiazide (BENICAR HCT) 20-12.5 MG  tablet Take 1 tablet by mouth daily. 90 tablet 2  . Pitavastatin Calcium (LIVALO) 2 MG TABS Take 1.5 tablets (3 mg total) by mouth every evening. Takes 1.5 tablet 135 tablet 3  . prednisoLONE acetate (PRED FORTE) 1 % ophthalmic suspension Place 1 drop into the left eye 3 (three) times daily.    . sodium chloride (MURO 128) 5 % ophthalmic solution Place 2 drops into both eyes daily.    Marland Kitchen triamcinolone cream (KENALOG) 0.1 % APPLY TO ITCHY SPOTS ONCE DAILY  1  . Neratinib Maleate (NERLYNX) 40 MG tablet Week1: Take 4 tabs (155m) by mouth once daily. WAPOL4 Take 5 tab (2094m daily. WeDCVU1 on: Take 6 tab (24031mdaily. Take with food. (Patient not taking: Reported on 10/05/2018) 160 tablet 0   No current facility-administered medications for this visit.    Facility-Administered Medications Ordered in Other Visits  Medication Dose Route Frequency Provider Last Rate Last Dose  . sodium chloride flush (NS) 0.9 % injection 10 mL  10 mL Intracatheter PRN FenTruitt MerleD   10 mL at 08/25/18 1111    PHYSICAL EXAMINATION: ECOG PERFORMANCE STATUS: 1 - Symptomatic but completely ambulatory  Vitals:   10/05/18 1312  BP: 123/81  Pulse: 77  Resp: 18  Temp: 98.1 F (36.7 C)  SpO2: 97%   Filed Weights    10/05/18 1312  Weight: 189 lb (85.7 kg)    GENERAL:alert, no distress and comfortable SKIN: skin color, texture, turgor are normal, no rashes or significant lesions EYES: normal, Conjunctiva are pink and non-injected, sclera clear OROPHARYNX:no exudate, no erythema and lips, buccal mucosa, and tongue normal  NECK: supple, thyroid normal size, non-tender, without nodularity LYMPH:  no palpable lymphadenopathy in the cervical, axillary or inguinal LUNGS: clear to auscultation and percussion with normal breathing effort HEART: regular rate & rhythm and no murmurs and no lower extremity edema ABDOMEN:abdomen soft, non-tender and normal bowel sounds Musculoskeletal:no cyanosis of digits and no clubbing  NEURO: alert & oriented x 3 with fluent speech, no focal motor/sensory deficits  LABORATORY DATA:  I have reviewed the data as listed CBC Latest Ref Rng & Units 10/05/2018 09/13/2018 08/25/2018  WBC 4.0 - 10.5 K/uL 4.6 5.7 3.7(L)  Hemoglobin 12.0 - 15.0 g/dL 13.3 13.4 13.2  Hematocrit 36.0 - 46.0 % 38.1 38.0 38.0  Platelets 150 - 400 K/uL 172 161 143(L)     CMP Latest Ref Rng & Units 10/05/2018 09/13/2018 08/25/2018  Glucose 70 - 99 mg/dL 112(H) 146(H) 120(H)  BUN 8 - 23 mg/dL _0 Creatinine 0.44 - 1.00 mg/dL 0.76 0.84 0.80  Sodium 135 - 145 mmol/L 137 136 138  Potassium 3.5 - 5.1 mmol/L 3.5 3.8 3.4(L)  Chloride 98 - 111 mmol/L 101 101 102  CO2 22 - 32 mmol/L _1 Calcium 8.9 - 10.3 mg/dL 9.3 8.8(L) 9.1  Total Protein 6.5 - 8.1 g/dL 7.3 6.8 7.1  Total Bilirubin 0.3 - 1.2 mg/dL 0.6 0.6 0.7  Alkaline Phos 38 - 126 U/L 50 50 55  AST 15 - 41 U/L 58(H) 46(H) 82(H)  ALT 0 - 44 U/L 96(H) 81(H) 122(H)      RADIOGRAPHIC STUDIES: I have personally reviewed the radiological images as listed and agreed with the findings in the report. No results found.   ASSESSMENT & PLAN:  VivSHAYONA HIBBITTS a 67 84o. female with history of  1. Malignant neoplasm of upper inner quadrant of  right breast , Invasive ductal  carcinoma, pT2N0M0, G2, ER+/PR-/HER2+ -Diagnosed in 04/2017. Treated with right breast lumpectomy, adjuvant chemo and radiation. Currently on Anastrozole and Neratinib. Tolerating well with mild joint stiffness and diarrhea. -Labs reviewed, CBC showed Hg 13.3, other wise overall WNLs. CMP showed AST 58 ALT 96. -she is overall clinically stable and doing well. She is recovering from a URTI she got 3 weeks ago with residual productive cough. She denies fever or CP.  -she tried Neratinib and had a severe diarrhea.  She is willing to try again, but has not done due to her recent URI. -Will repeat right mammogram in 10/2018 at [facility]    2. Endometrial adenocarcinomawith lymph node metastasis, pT1apN1M0, FIGO stage IIIc, MSI-H, probable RP node recurrence in 12/2017 -Diagnosed in 04/2017. Treated with hysterectomy, BSO, adjuvant radiation, and chemo with Carbotaxol. Currently on Keytruda every 3 weeks for presumed retroperitoneal lymph node metastasis. Tolerating well. -Showed good response to treatment, RP node has significantly reduced in size  -Labs reviewed, CBC showed Hg 13.3, other wise overall WNLs. CMP showed AST 58 ALT 96. -f/u in 3 weeks with lab and flush with CT CAP a few days before  3. HTN -f/u with PCP  4. Transaminitis  - Secondary to fatty liver disease -Labs reviewed, CMP showed AST 58 ALT 96.  5.Osteopenia -DEXA scan in 05/2018 revealed T score of -2.0. -She is on monthly boniva, prescribed by her PCP, since 02/2018.  -ContinueVitamin D and Calcium supplements. -will check her Vitamin D level every 6 months.    6. 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 Mainly in her feet -Continue B complex.She would like to hold off on Gabapentin at this time.  7. Fatigue  -She currently works part-time 16hr/week -She works at [employer] and tries to stay active  8. Recent sinusitis  -improved, with mild residual dry cough -Encouraged her to use  allergy medicine and nasal spray  Plan  -f/u and Keytruda in 3 weeks with labs, flush and CT CAP a few days before -will schedule right mammogram at [facility] in 10/2018  No problem-specific Assessment & Plan notes found for this encounter.   Orders Placed This Encounter  Procedures  . MM DIAG BREAST TOMO UNI RIGHT    Standing Status:   Future    Standing Expiration Date:   10/06/2019    Scheduling Instructions:     [facility]    Order Specific Question:   Reason for Exam (SYMPTOM  OR DIAGNOSIS REQUIRED)    Answer:   f/u calcification    Order Specific Question:   Preferred imaging location?    Answer:   [facility]  . CT Abdomen Pelvis W Contrast    Standing Status:   Future    Standing Expiration Date:   10/05/2019    Order Specific Question:   If indicated for the ordered procedure, I authorize the administration of contrast media per Radiology protocol    Answer:   Yes    Order Specific Question:   Preferred imaging location?    Answer:   [facility]    Order Specific Question:   Is Oral Contrast requested for this exam?    Answer:   Yes, Per Radiology protocol    Order Specific Question:   Radiology Contrast Protocol - do NOT remove file path    Answer:   \\[email or web address]  . CT Chest W Contrast    Standing Status:   Future    Standing Expiration Date:   10/05/2019    Order Specific Question:  If indicated for the ordered procedure, I authorize the administration of contrast media per Radiology protocol    Answer:   Yes    Order Specific Question:   Preferred imaging location?    Answer:   [facility]    Order Specific Question:   Radiology Contrast Protocol - do NOT remove file path    Answer:   \\[email or web address]   All questions were answered. The patient knows to call the clinic with any problems, questions or concerns. No barriers to learning was detected. I spent 20 minutes counseling the patient face to face.  The total time spent in the appointment was 25 minutes and more than 50% was on counseling and review of test results  I, Noor Dweik am acting as scribe for Dr. Truitt Merle.  I have reviewed the above documentation for accuracy and completeness, and I agree with the above.     Truitt Merle, MD 10/05/2018

## 2018-10-01 ENCOUNTER — Telehealth: Payer: Self-pay | Admitting: Hematology

## 2018-10-01 NOTE — Telephone Encounter (Signed)
Called patient per 1/17 sch message - pt okay top leave as it for now ./

## 2018-10-05 ENCOUNTER — Inpatient Hospital Stay: Payer: Medicare Other

## 2018-10-05 ENCOUNTER — Telehealth: Payer: Self-pay | Admitting: Hematology

## 2018-10-05 ENCOUNTER — Inpatient Hospital Stay (HOSPITAL_BASED_OUTPATIENT_CLINIC_OR_DEPARTMENT_OTHER): Payer: Medicare Other | Admitting: Hematology

## 2018-10-05 ENCOUNTER — Inpatient Hospital Stay: Payer: Medicare Other | Attending: Hematology

## 2018-10-05 ENCOUNTER — Encounter: Payer: Self-pay | Admitting: Hematology

## 2018-10-05 VITALS — BP 123/81 | HR 77 | Temp 98.1°F | Resp 18 | Ht 63.0 in | Wt 189.0 lb

## 2018-10-05 DIAGNOSIS — Z79811 Long term (current) use of aromatase inhibitors: Secondary | ICD-10-CM

## 2018-10-05 DIAGNOSIS — M8580 Other specified disorders of bone density and structure, unspecified site: Secondary | ICD-10-CM | POA: Diagnosis not present

## 2018-10-05 DIAGNOSIS — C541 Malignant neoplasm of endometrium: Secondary | ICD-10-CM

## 2018-10-05 DIAGNOSIS — R740 Nonspecific elevation of levels of transaminase and lactic acid dehydrogenase [LDH]: Secondary | ICD-10-CM | POA: Insufficient documentation

## 2018-10-05 DIAGNOSIS — M810 Age-related osteoporosis without current pathological fracture: Secondary | ICD-10-CM

## 2018-10-05 DIAGNOSIS — C779 Secondary and unspecified malignant neoplasm of lymph node, unspecified: Secondary | ICD-10-CM | POA: Diagnosis not present

## 2018-10-05 DIAGNOSIS — G620 Drug-induced polyneuropathy: Secondary | ICD-10-CM | POA: Diagnosis not present

## 2018-10-05 DIAGNOSIS — C50211 Malignant neoplasm of upper-inner quadrant of right female breast: Secondary | ICD-10-CM

## 2018-10-05 DIAGNOSIS — Z170 Estrogen receptor positive status [ER+]: Secondary | ICD-10-CM | POA: Diagnosis not present

## 2018-10-05 DIAGNOSIS — E669 Obesity, unspecified: Secondary | ICD-10-CM | POA: Insufficient documentation

## 2018-10-05 DIAGNOSIS — Z79899 Other long term (current) drug therapy: Secondary | ICD-10-CM | POA: Insufficient documentation

## 2018-10-05 DIAGNOSIS — Z923 Personal history of irradiation: Secondary | ICD-10-CM | POA: Insufficient documentation

## 2018-10-05 DIAGNOSIS — Z95828 Presence of other vascular implants and grafts: Secondary | ICD-10-CM

## 2018-10-05 DIAGNOSIS — I10 Essential (primary) hypertension: Secondary | ICD-10-CM

## 2018-10-05 DIAGNOSIS — Z5112 Encounter for antineoplastic immunotherapy: Secondary | ICD-10-CM | POA: Insufficient documentation

## 2018-10-05 DIAGNOSIS — R7401 Elevation of levels of liver transaminase levels: Secondary | ICD-10-CM

## 2018-10-05 LAB — CBC WITH DIFFERENTIAL/PLATELET
Abs Immature Granulocytes: 0 10*3/uL (ref 0.00–0.07)
Basophils Absolute: 0 10*3/uL (ref 0.0–0.1)
Basophils Relative: 1 %
Eosinophils Absolute: 0.1 10*3/uL (ref 0.0–0.5)
Eosinophils Relative: 2 %
HEMATOCRIT: 38.1 % (ref 36.0–46.0)
HEMOGLOBIN: 13.3 g/dL (ref 12.0–15.0)
Immature Granulocytes: 0 %
LYMPHS PCT: 50 %
Lymphs Abs: 2.3 10*3/uL (ref 0.7–4.0)
MCH: 35.1 pg — ABNORMAL HIGH (ref 26.0–34.0)
MCHC: 34.9 g/dL (ref 30.0–36.0)
MCV: 100.5 fL — ABNORMAL HIGH (ref 80.0–100.0)
Monocytes Absolute: 0.5 10*3/uL (ref 0.1–1.0)
Monocytes Relative: 12 %
NEUTROS ABS: 1.6 10*3/uL — AB (ref 1.7–7.7)
Neutrophils Relative %: 35 %
Platelets: 172 10*3/uL (ref 150–400)
RBC: 3.79 MIL/uL — ABNORMAL LOW (ref 3.87–5.11)
RDW: 12.1 % (ref 11.5–15.5)
WBC: 4.6 10*3/uL (ref 4.0–10.5)
nRBC: 0 % (ref 0.0–0.2)

## 2018-10-05 LAB — COMPREHENSIVE METABOLIC PANEL
ALT: 96 U/L — ABNORMAL HIGH (ref 0–44)
ANION GAP: 8 (ref 5–15)
AST: 58 U/L — ABNORMAL HIGH (ref 15–41)
Albumin: 3.8 g/dL (ref 3.5–5.0)
Alkaline Phosphatase: 50 U/L (ref 38–126)
BUN: 11 mg/dL (ref 8–23)
CO2: 28 mmol/L (ref 22–32)
Calcium: 9.3 mg/dL (ref 8.9–10.3)
Chloride: 101 mmol/L (ref 98–111)
Creatinine, Ser: 0.76 mg/dL (ref 0.44–1.00)
GFR calc Af Amer: >60 mL/min (ref >60)
GFR calc non Af Amer: >60 mL/min (ref >60)
Glucose, Bld: 112 mg/dL — ABNORMAL HIGH (ref 70–99)
POTASSIUM: 3.5 mmol/L (ref 3.5–5.1)
Sodium: 137 mmol/L (ref 135–145)
Total Bilirubin: 0.6 mg/dL (ref 0.3–1.2)
Total Protein: 7.3 g/dL (ref 6.5–8.1)

## 2018-10-05 MED ORDER — SODIUM CHLORIDE 0.9% FLUSH
10.0000 mL | Freq: Once | INTRAVENOUS | Status: AC
  Administered 2018-10-05: 10 mL
  Filled 2018-10-05: qty 10

## 2018-10-05 MED ORDER — SODIUM CHLORIDE 0.9 % IV SOLN
Freq: Once | INTRAVENOUS | Status: AC
  Administered 2018-10-05: 15:00:00 via INTRAVENOUS
  Filled 2018-10-05: qty 250

## 2018-10-05 MED ORDER — HEPARIN SOD (PORK) LOCK FLUSH 100 UNIT/ML IV SOLN
500.0000 [IU] | Freq: Once | INTRAVENOUS | Status: AC | PRN
  Administered 2018-10-05: 500 [IU]
  Filled 2018-10-05: qty 5

## 2018-10-05 MED ORDER — SODIUM CHLORIDE 0.9% FLUSH
10.0000 mL | INTRAVENOUS | Status: DC | PRN
  Administered 2018-10-05: 10 mL
  Filled 2018-10-05: qty 10

## 2018-10-05 MED ORDER — SODIUM CHLORIDE 0.9 % IV SOLN
200.0000 mg | Freq: Once | INTRAVENOUS | Status: AC
  Administered 2018-10-05: 200 mg via INTRAVENOUS
  Filled 2018-10-05: qty 8

## 2018-10-05 NOTE — Progress Notes (Signed)
Ok to treat with liver enzymes today per MD Burr Medico

## 2018-10-05 NOTE — Telephone Encounter (Signed)
Scheduled appts per 01/21 los. °Printed calendar and avs. °

## 2018-10-22 ENCOUNTER — Other Ambulatory Visit: Payer: Self-pay | Admitting: Hematology

## 2018-10-22 DIAGNOSIS — Z170 Estrogen receptor positive status [ER+]: Secondary | ICD-10-CM

## 2018-10-22 DIAGNOSIS — C50211 Malignant neoplasm of upper-inner quadrant of right female breast: Secondary | ICD-10-CM

## 2018-10-25 ENCOUNTER — Inpatient Hospital Stay: Payer: Medicare Other

## 2018-10-25 ENCOUNTER — Ambulatory Visit (HOSPITAL_COMMUNITY)
Admission: RE | Admit: 2018-10-25 | Discharge: 2018-10-25 | Disposition: A | Discharge status: Home / Self Care | Payer: Medicare Other | Source: Ambulatory Visit | Attending: Hematology | Admitting: Hematology

## 2018-10-25 DIAGNOSIS — I10 Essential (primary) hypertension: Secondary | ICD-10-CM | POA: Diagnosis not present

## 2018-10-25 DIAGNOSIS — Z5112 Encounter for antineoplastic immunotherapy: Secondary | ICD-10-CM | POA: Insufficient documentation

## 2018-10-25 DIAGNOSIS — K429 Umbilical hernia without obstruction or gangrene: Secondary | ICD-10-CM | POA: Diagnosis not present

## 2018-10-25 DIAGNOSIS — Z923 Personal history of irradiation: Secondary | ICD-10-CM | POA: Diagnosis not present

## 2018-10-25 DIAGNOSIS — C541 Malignant neoplasm of endometrium: Secondary | ICD-10-CM | POA: Insufficient documentation

## 2018-10-25 DIAGNOSIS — M8580 Other specified disorders of bone density and structure, unspecified site: Secondary | ICD-10-CM | POA: Diagnosis not present

## 2018-10-25 DIAGNOSIS — Z9221 Personal history of antineoplastic chemotherapy: Secondary | ICD-10-CM | POA: Insufficient documentation

## 2018-10-25 DIAGNOSIS — Z79811 Long term (current) use of aromatase inhibitors: Secondary | ICD-10-CM | POA: Insufficient documentation

## 2018-10-25 DIAGNOSIS — Z170 Estrogen receptor positive status [ER+]: Secondary | ICD-10-CM | POA: Insufficient documentation

## 2018-10-25 DIAGNOSIS — K760 Fatty (change of) liver, not elsewhere classified: Secondary | ICD-10-CM | POA: Diagnosis not present

## 2018-10-25 DIAGNOSIS — J9811 Atelectasis: Secondary | ICD-10-CM | POA: Diagnosis not present

## 2018-10-25 DIAGNOSIS — Z800 Family history of malignant neoplasm of digestive organs: Secondary | ICD-10-CM | POA: Diagnosis not present

## 2018-10-25 DIAGNOSIS — Z95828 Presence of other vascular implants and grafts: Secondary | ICD-10-CM

## 2018-10-25 DIAGNOSIS — Z8052 Family history of malignant neoplasm of bladder: Secondary | ICD-10-CM | POA: Diagnosis not present

## 2018-10-25 DIAGNOSIS — C50211 Malignant neoplasm of upper-inner quadrant of right female breast: Secondary | ICD-10-CM | POA: Insufficient documentation

## 2018-10-25 DIAGNOSIS — Z79899 Other long term (current) drug therapy: Secondary | ICD-10-CM | POA: Insufficient documentation

## 2018-10-25 DIAGNOSIS — G629 Polyneuropathy, unspecified: Secondary | ICD-10-CM | POA: Diagnosis not present

## 2018-10-25 DIAGNOSIS — Z452 Encounter for adjustment and management of vascular access device: Secondary | ICD-10-CM | POA: Insufficient documentation

## 2018-10-25 DIAGNOSIS — Z90710 Acquired absence of both cervix and uterus: Secondary | ICD-10-CM | POA: Diagnosis not present

## 2018-10-25 DIAGNOSIS — I700 Atherosclerosis of aorta: Secondary | ICD-10-CM | POA: Diagnosis not present

## 2018-10-25 DIAGNOSIS — C50911 Malignant neoplasm of unspecified site of right female breast: Secondary | ICD-10-CM | POA: Diagnosis not present

## 2018-10-25 DIAGNOSIS — R739 Hyperglycemia, unspecified: Secondary | ICD-10-CM | POA: Insufficient documentation

## 2018-10-25 DIAGNOSIS — C779 Secondary and unspecified malignant neoplasm of lymph node, unspecified: Secondary | ICD-10-CM | POA: Insufficient documentation

## 2018-10-25 MED ORDER — SODIUM CHLORIDE (PF) 0.9 % IJ SOLN
INTRAMUSCULAR | Status: AC
  Filled 2018-10-25: qty 50

## 2018-10-25 MED ORDER — HEPARIN SOD (PORK) LOCK FLUSH 100 UNIT/ML IV SOLN: 500.0000 [IU] | Freq: Once | INTRAVENOUS | Status: DC

## 2018-10-25 MED ORDER — SODIUM CHLORIDE 0.9% FLUSH
10.0000 mL | Freq: Once | INTRAVENOUS | Status: AC
  Administered 2018-10-25: 10 mL
  Filled 2018-10-25: qty 10

## 2018-10-25 MED ORDER — HEPARIN SOD (PORK) LOCK FLUSH 100 UNIT/ML IV SOLN
INTRAVENOUS | Status: AC
  Administered 2018-10-25: 500 [IU] via INTRAVENOUS
  Filled 2018-10-25: qty 5

## 2018-10-25 MED ORDER — IOHEXOL 300 MG/ML  SOLN
100.0000 mL | Freq: Once | INTRAMUSCULAR | Status: AC | PRN
  Administered 2018-10-25: 100 mL via INTRAVENOUS

## 2018-10-25 NOTE — Progress Notes (Signed)
[place]   [phone] [phone]   Clinic Follow up Note   Patient Care Team: [name], MD as PCP - Haskell Riling, MD as Consulting Physician (General Surgery) Truitt Merle, MD as Consulting Physician (Hematology) [name], MD as Attending Physician (Radiation Oncology)  Date of Service:  10/27/2018  CHIEF COMPLAINT: F/u on breast and endometrial cancers  SUMMARY OF ONCOLOGIC HISTORY: Oncology History   MSI high Cancer Staging Endometrial cancer [facility]) Staging form: Corpus Uteri - Adenosarcoma, AJCC 8th Edition - Pathologic stage from 06/09/2017: Stage IIIC (pT1a, pN1, cM0) - Signed by Truitt Merle, MD on 06/16/2017  Malignant neoplasm of upper-inner quadrant of right breast in female, estrogen receptor positive ([place]) Staging form: Breast, AJCC 8th Edition - Clinical stage from 05/06/2017: Stage IA (cT1c, cN0, cM0, G2, ER: Positive, PR: Negative, HER2: Positive) - Unsigned - Pathologic stage from 05/25/2017: Stage IIA (pT2, pN0, cM0, G2, ER: Positive, PR: Negative, HER2: Negative) - Signed by Truitt Merle, MD on 06/16/2017       Malignant neoplasm of upper-inner quadrant of right breast in female, estrogen receptor positive ([place])   04/27/2017 Initial Biopsy    Diagnosis Breast, right, needle core biopsy INVASIVE DUCTAL CARCINOMA, GRADE 2 Microscopic Comment The neoplasm has intracellular mucin and signet ring cell features. Immunostains shows these cells are positive for ER,GATA3, ck7 and GCDFP, negative for ck20, cdx2, TTF-1 and pax8, The immunostaining pattern supports the neoplasm is breast primary.    04/27/2017 Receptors her2    Estrogen Receptor: 70%, POSITIVE, MODERATE STAINING INTENSITY Progesterone Receptor: 0%, NEGATIVE Proliferation Marker Ki67: 30% HER2 - **POSITIVE** RATIO OF HER2/CEP17 SIGNALS 2.91 AVERAGE HER2 COPY NUMBER PER CELL 7.28    04/27/2017 Initial Diagnosis    Malignant neoplasm of  upper-inner quadrant of right breast in female, estrogen receptor positive ([place])    05/07/2017 Imaging    CT Chest W Contrast 05/07/17 IMPRESSION: Tiny well-defined fatty lesion on the pleura at the right lung base. The thinner slice collimation used for today's chest CT eliminates volume-averaging seen in the lesion on the prior exam and confirms that this is a diffusely fatty nodule. This is a benign finding and likely represents a tiny lipoma. No defect in the hemidiaphragm evident to suggest tiny diaphragmatic hernia. Pulmonary hamartoma a consideration although the lack of soft tissue components makes this less likely.    05/15/2017 Genetic Testing    Patient had genetic testing due to a personal history of breast cancer and uterine cancer as well as a family history of cancer. The Multi-Cancer panel was ordered. The Multi-Cancer Panel offered by [facility] includes sequencing and/or deletion duplication testing of the following 83 genes: ALK, APC, ATM, AXIN2,BAP1,  BARD1, BLM, BMPR1A, BRCA1, BRCA2, BRIP1, CASR, CDC73, CDH1, CDK4, CDKN1B, CDKN1C, CDKN2A (p14ARF), CDKN2A (p16INK4a), CEBPA, CHEK2, CTNNA1, DICER1, DIS3L2, EGFR (c.2369C>T, p.Thr790Met variant only), EPCAM (Deletion/duplication testing only), FH, FLCN, GATA2, GPC3, GREM1 (Promoter region deletion/duplication testing only), HOXB13 (c.251G>A, p.Gly84Glu), HRAS, KIT, MAX, MEN1, MET, MITF (c.952G>A, p.Glu318Lys variant only), MLH1, MSH2, MSH3, MSH6, MUTYH, NBN, NF1, NF2, NTHL1, PALB2, PDGFRA, PHOX2B, PMS2, POLD1, POLE, POT1, PRKAR1A, PTCH1, PTEN, RAD50, RAD51C, RAD51D, RB1, RECQL4, RET, RUNX1, SDHAF2, SDHA (sequence changes only), SDHB, SDHC, SDHD, SMAD4, SMARCA4, SMARCB1, SMARCE1, STK11, SUFU, TERC, TERT, TMEM127, TP53, TSC1, TSC2, VHL, WRN and WT1.   Results: No pathogenic mutations were identified. A VUS in the GATA2 gene c.1348G>A (p.Gly450Arg) was identified.  The date of this test report is 05/25/2017.  05/25/2017 Surgery     RIGHT BREAST LUMPECTOMY WITH RADIOACTIVE SEED AND  RIGHT AXILLARY SENTINEL LYMPH NODE BIOPSY and Pot placement by Dr. Excell Seltzer 05/25/17    05/25/2017 Pathology Results    Diagnosis 05/25/17 1. Breast, lumpectomy, right - INVASIVE DUCTAL CARCINOMA, GRADE II/III, SPANNING 2.2 CM. - DUCTAL CARCINOMA IN SITU, HIGH GRADE. - THE SURGICAL RESECTION MARGINS ARE NEGATIVE FOR CARCINOMA. - SEE ONCOLOGY TABLE BELOW. 2. Breast, excision, right additional superior margin - BENIGN FIBROADIPOSE TISSUE. - BENIGN SKELETAL MUSCLE. - SEE COMMENT. 3. Breast, excision, right additional lateral margin - BENIGN BREAST PARENCHYMA. - THERE IS NO EVIDENCE OF MALIGNANCY. - SEE COMMENT. 4. Breast, excision, chest wall margin - BENIGN FIBROADIPOSE TISSUE. - THERE IS NO EVIDENCE OF MALIGNANCY. - SEE COMMENT. 5. Lymph node, sentinel, biopsy, right axillary - THERE IS NO EVIDENCE OF CARCINOMA IN 1 OF 1 LYMPH NODE (0/1). 6. Lymph node, sentinel, biopsy, right axillary - THERE IS NO EVIDENCE OF CARCINOMA IN 1 OF 1 LYMPH NODE (0/1). 7. Lymph node, sentinel, biopsy, right axillary - THERE IS NO EVIDENCE OF CARCINOMA IN 1 OF 1 LYMPH NODE (0/1).    06/26/2017 PET scan    PET  IMPRESSION: 1. Postoperative findings both in the right breast and in the anatomic pelvis, with associated low-grade activity considered to be postoperative in nature. No hypermetabolic adenopathy or hypermetabolic lesions are identified to suggest active metastatic disease/malignancy. 2. Other imaging findings of potential clinical significance: Aortic Atherosclerosis (ICD10-I70.0). Sigmoid colon diverticulosis. Biapical pleuroparenchymal scarring in the lungs. Small amount of free pelvic fluid in the cul-de-sac, likely postoperative.     07/01/2017 - 10/14/2017 Chemotherapy    Adjuvant TCH with Onpro every 3 weeks for 6 cycles starting on 07/01/17, Changed Taxol to abraxane with cycle 4 due to drug rash reaction, followed by Herceptin every  3 weeks for 6 months     11/01/2017 - 12/08/2017 Radiation Therapy    Radiation therapy to her right breast with [name]    11/04/2017 - 06/2018 Chemotherapy    Maintenance Herceptin starting 11/04/17 and will comeplete her 12 months in 06/2018     12/23/2017 -  Anti-estrogen oral therapy    Adjuvant letrozole 2.5 mg daily started 12/23/17, switched to exemestane on 04/21/18 due to joint pain. Could not afford aromasin, started anastrozole 04/2018.     12/30/2017 Imaging    CT CAP W Contrast 12/30/17 IMPRESSION: Increased size of 1.1 cm aorto-caval retroperitoneal lymph node. This could be reactive due to interval hysterectomy, however metastatic carcinoma cannot be excluded. Consider continued attention on short-term follow-up CT, or PET-CT scan for further evaluation.  Mild hepatic steatosis.    01/25/2018 PET scan    IMPRESSION: 1. Enlarging hypermetabolic aortocaval lymph node is worrisome for metastatic disease. 2. Probable postoperative seroma in the medial right breast, with associated mild inflammatory hypermetabolism.    04/28/2018 Mammogram    Probably benign. Calcifications in the right breast most likely are dystrophic, related to previous surgery, and are probably benign.     04/28/2018 Imaging    DEXA Scan T Score -2.0    06/10/2018 Echocardiogram    06/10/2018 ECHO LV EF: 55% -   60%    08/28/2018 -  Chemotherapy    Neratinib '4mg'$  for 1 week then titrate up with 1 additional tablet weekly up to 12 mg starting 08/28/18. May retry this month (10/2018).     10/25/2018 Imaging    CT CAP 10/25/18  IMPRESSION: 1. No evidence of metastatic disease.  2.  Aortic atherosclerosis (ICD10-170.0).     Endometrial adenocarcinoma ([place])   05/07/2017 Initial Diagnosis    Endometrial adenocarcinoma ([place])    06/09/2017 Surgery    XI ROBOTIC ASSISTED TOTAL LAPOROSCOPIC HYSTERECTOMY WITH BILATERAL SALPINGO OOPHORECTOMY and SENTINEL NODE BIOPSY by Dr. Denman George 06/09/17    06/09/2017 Pathology  Results    Diagnosis 06/09/17 1. Lymph node, sentinel, biopsy, right external iliac - METASTATIC ADENOCARCINOMA IN ONE LYMPH NODE (1/1). 2. Lymph node, sentinel, biopsy, left obturator - ONE BENIGN LYMPH NODE (0/1). 3. Lymph node, sentinel, biopsy, left external iliac - METASTATIC ADENOCARCINOMA IN ONE LYMPH NODE (1/1). 4. Uterus +/- tubes/ovaries, neoplastic ENDOMETRIUM: - ENDOMETRIAL ADENOCARCINOMA, 3.2 CM. - CARCINOMA INVADES INNER HALF OF MYOMETRIUM. 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 - CERVIX, BILATERAL FALLOPIAN TUBES AND BILATERAL OVARIES FREE OF TUMOR    08/18/2017 - 09/17/2017 Radiation Therapy    vaginal brachytherapy per 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    02/17/2018 -  Chemotherapy    Keytruda every 3 weeks starting 02/17/18      CURRENT THERAPY:  -Keytruda every 3 weeksstarting 02/17/18 -Neratinib dailystarting 08/28/18, stopped soon after starting due to diarrhea. She may try again in 10/2018 -Anastrozole2.'5mg'$  daily starting 12/23/17   INTERVAL HISTORY:  Dawn [name] is here for a follow up of right breast cancer and endometrial cancer. She presents to the clinic today with her husband. She notes she is doing well. She notes she is tolerating Keytruda well with manageable skin mild skin rash. She uses topical cream due to the itching. She also notes a cough when she eats. She is not sure if this is related to allergies so she will try allergy medication. She notes she tries to go to exercise with treadmill and weights a few days a week.    REVIEW OF SYSTEMS:   Constitutional: Denies fevers, chills or abnormal weight loss Eyes: Denies blurriness of vision Ears, nose, mouth, throat, and face: Denies mucositis or sore throat Respiratory: Denies dyspnea or wheezes (+) intermittent cough with eating  Cardiovascular: Denies  palpitation, chest discomfort or lower extremity swelling Gastrointestinal:  Denies nausea, heartburn or change in bowel habits Skin: Denies abnormal skin rashes Lymphatics: Denies new lymphadenopathy or easy bruising Neurological:Denies numbness, tingling or new weaknesses Behavioral/Psych: Mood is stable, no new changes  All other systems were reviewed with the patient and are negative.  MEDICAL HISTORY:  Past Medical History:  Diagnosis Date  . Anemia    as a child.  . Arthritis   . Bilateral cataracts   . Bilateral leg cramps   . Cancer ([place])    skin  . Dyspnea   . Family history of bladder cancer   . Family history of colon cancer in father   . Fuchs' corneal dystrophy   . GERD (gastroesophageal reflux disease)   . Hearing loss    Right side 30%  . History of hiatal hernia    small size  . History of radiation therapy 08/21/17, 08/28/17,09/01/17, 09/11/17, 09/17/17   Vaginal cuff brachytherapy.   Marland Kitchen History of radiation therapy 11/11/17- 12/08/17   Right Breast treated to 40.05 Gy with 15 fx of 2.67 Gy and a boost of 10 Gy with 5 fx.   . Hyperlipidemia   . Hypertension   . IBS (irritable bowel syndrome)    hx of  .  Malignant neoplasm of upper-inner quadrant of right female breast ([place]) 04/2017  . NAFL (nonalcoholic fatty liver)   . Obesity   . PONV (postoperative nausea and vomiting)   . 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 Uterine cancer ([place]) 04/2017   endometrial cancer  . Varices, gastric   . Vertigo     SURGICAL HISTORY: Past Surgical History:  Procedure Laterality Date  . BREAST BIOPSY Right 04/27/2017  . BREAST LUMPECTOMY WITH RADIOACTIVE SEED AND SENTINEL LYMPH NODE BIOPSY Right 05/25/2017   Procedure: RIGHT BREAST LUMPECTOMY WITH RADIOACTIVE SEED AND  RIGHT AXILLARY SENTINEL LYMPH NODE BIOPSY;  Surgeon: Excell Seltzer, MD;  Location: [facility];  Service: General;  Laterality: Right;  . CATARACT  EXTRACTION, BILATERAL    . COLONOSCOPY    . HYSTEROSCOPY W/D&C N/A 04/29/2017   Procedure: DILATATION AND CURETTAGE /HYSTEROSCOPY;  Surgeon: Linda Hedges, DO;  Location: [facility];  Service: Gynecology;  Laterality: N/A;  . PORTACATH PLACEMENT Left 05/25/2017   Procedure: INSERTION PORT-A-CATH;  Surgeon: Excell Seltzer, MD;  Location: [facility];  Service: General;  Laterality: Left;  . ROBOTIC ASSISTED TOTAL HYSTERECTOMY WITH BILATERAL SALPINGO OOPHERECTOMY N/A 06/09/2017   Procedure: XI ROBOTIC ASSISTED TOTAL LAPOROSCOPIC HYSTERECTOMY WITH BILATERAL SALPINGO OOPHORECTOMY;  Surgeon: Everitt Amber, MD;  Location: [facility];  Service: Gynecology;  Laterality: N/A;  . SENTINEL NODE BIOPSY N/A 06/09/2017   Procedure: SENTINEL NODE BIOPSY;  Surgeon: Everitt Amber, MD;  Location: [facility];  Service: Gynecology;  Laterality: N/A;    I have reviewed the social history and family history with the patient and they are unchanged from previous note.  ALLERGIES:  is allergic to ciprofloxacin; crestor [rosuvastatin calcium]; fosamax [alendronate sodium]; penicillins; and taxol [paclitaxel].  MEDICATIONS:  Current Outpatient Medications  Medication Sig Dispense Refill  . acetaminophen (TYLENOL) 500 MG tablet Take 500 mg by mouth every 6 (six) hours as needed for moderate pain.    Marland Kitchen anastrozole (ARIMIDEX) 1 MG tablet TAKE 1 TABLET BY MOUTH  DAILY 30 tablet 3  . Ascorbic Acid (VITAMIN C) 1000 MG tablet Take 1,000 mg by mouth daily.    Marland Kitchen b complex vitamins capsule Take 1 capsule by mouth daily.    . Budesonide ER 9 MG TB24 Take 9 mg by mouth daily. 30 tablet 2  . Calcium Carbonate-Vitamin D3 (CALCIUM 600-D) 600-400 MG-UNIT TABS Take 1-2 tablets by mouth daily.    . diphenhydrAMINE (BENADRYL) 25 MG tablet Take 25-50 mg by mouth every 6 (six) hours as needed for sleep.    . diphenoxylate-atropine (LOMOTIL) 2.5-0.025 MG tablet Take 1-2 tablets by mouth 4 (four) times daily as needed for diarrhea or loose  stools. 60 tablet 1  . ibandronate (BONIVA) 150 MG tablet Take in the morning, once a month,  with a full glass of water,on an empty stomach,do not take anything else by mouth or lie down for the next 30 min. 3 tablet 3  . lidocaine-prilocaine (EMLA) cream   2  . loperamide (IMODIUM) 2 MG capsule Take 2 capsules (4 mg total) by mouth 3 (three) times daily. May take additional 2-'4mg'$  as needed for breakthrough diarrhea. 200 capsule 2  . meclizine (ANTIVERT) 25 MG tablet Take 1-2 tablets (25-50 mg total) by mouth 3 (three) times daily as needed for dizziness. (Patient taking differently: Take 25-50 mg by mouth 3 (three) times daily as needed for dizziness (depends on dizziness if takes 1-2 tablets). ) 30 tablet 0  .  Multiple Vitamin (MULTIVITAMIN) capsule Take 1 capsule by mouth daily.      . Neratinib Maleate (NERLYNX) 40 MG tablet Week1: Take 4 tabs ('160mg'$ ) by mouth once daily. GXQJ1: Take 5 tab ('200mg'$ ) daily. HERD4 & on: Take 6 tab ('240mg'$ ) daily. Take with food. 160 tablet 0  . Neratinib Maleate (NERLYNX) 40 MG tablet Take 6 tablets (240 mg total) by mouth daily. Take with food. 180 tablet 0  . olmesartan-hydrochlorothiazide (BENICAR HCT) 20-12.5 MG tablet Take 1 tablet by mouth daily. 90 tablet 2  . Pitavastatin Calcium (LIVALO) 2 MG TABS Take 1.5 tablets (3 mg total) by mouth every evening. Takes 1.5 tablet 135 tablet 3  . prednisoLONE acetate (PRED FORTE) 1 % ophthalmic suspension Place 1 drop into the left eye 3 (three) times daily.    . sodium chloride (MURO 128) 5 % ophthalmic solution Place 2 drops into both eyes daily.    Marland Kitchen triamcinolone cream (KENALOG) 0.1 % APPLY TO ITCHY SPOTS ONCE DAILY  1   No current facility-administered medications for this visit.    Facility-Administered Medications Ordered in Other Visits  Medication Dose Route Frequency Provider Last Rate Last Dose  . sodium chloride flush (NS) 0.9 % injection 10 mL  10 mL Intracatheter PRN Truitt Merle, MD   10 mL at 08/25/18 1111      PHYSICAL EXAMINATION: ECOG PERFORMANCE STATUS: 1 - Symptomatic but completely ambulatory  Vitals:   10/27/18 0842  BP: (!) 143/78  Pulse: 80  Resp: 18  Temp: 97.9 F (36.6 C)  SpO2: 98%   Filed Weights   10/27/18 0842  Weight: 190 lb 4.8 oz (86.3 kg)    GENERAL:alert, no distress and comfortable SKIN: skin color, texture, turgor are normal, no rashes or significant lesions EYES: normal, Conjunctiva are pink and non-injected, sclera clear OROPHARYNX:no exudate, no erythema and lips, buccal mucosa, and tongue normal  NECK: supple, thyroid normal size, non-tender, without nodularity LYMPH:  no palpable lymphadenopathy in the cervical, axillary or inguinal LUNGS: clear to auscultation and percussion with normal breathing effort HEART: regular rate & rhythm and no murmurs and no lower extremity edema ABDOMEN:abdomen soft, non-tender and normal bowel sounds Musculoskeletal:no cyanosis of digits and no clubbing  NEURO: alert & oriented x 3 with fluent speech, no focal motor/sensory deficits  LABORATORY DATA:  I have reviewed the data as listed CBC Latest Ref Rng & Units 10/27/2018 10/05/2018 09/13/2018  WBC 4.0 - 10.5 K/uL 3.6(L) 4.6 5.7  Hemoglobin 12.0 - 15.0 g/dL 13.2 13.3 13.4  Hematocrit 36.0 - 46.0 % 38.6 38.1 38.0  Platelets 150 - 400 K/uL 141(L) 172 161     CMP Latest Ref Rng & Units 10/27/2018 10/05/2018 09/13/2018  Glucose 70 - 99 mg/dL 213(H) 112(H) 146(H)  BUN 8 - 23 mg/dL '12 11 11  '$ Creatinine 0.44 - 1.00 mg/dL 0.83 0.76 0.84  Sodium 135 - 145 mmol/L 138 137 136  Potassium 3.5 - 5.1 mmol/L 3.6 3.5 3.8  Chloride 98 - 111 mmol/L 101 101 101  CO2 22 - 32 mmol/L '26 28 26  '$ Calcium 8.9 - 10.3 mg/dL 9.0 9.3 8.8(L)  Total Protein 6.5 - 8.1 g/dL 6.9 7.3 6.8  Total Bilirubin 0.3 - 1.2 mg/dL 0.6 0.6 0.6  Alkaline Phos 38 - 126 U/L 49 50 50  AST 15 - 41 U/L 63(H) 58(H) 46(H)  ALT 0 - 44 U/L 95(H) 96(H) 81(H)      RADIOGRAPHIC STUDIES: I have personally reviewed  the radiological images as listed  and agreed with the findings in the report. No results found.   ASSESSMENT & PLAN:  Dawn [name] is a 68 y.o. female with   1. 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 Treated with right breast lumpectomy, adjuvant chemo and radiation. Currently on Anastrozole. Tolerating well with mild joint stiffness and diarrhea. -She tried Neratinib and had a severe diarrhea. She is willing to try again but has not done yet -She is overall doing clinically doing well. Labs reviewed WBC at 3.6, ANC at 1.5, PLT at 141K. CMP unremarkable except hyperglycemia.    -F/u in 3 weeks. If doing well, I will see her every other infusion.    2. Endometrial adenocarcinomawith lymph node metastasis, pT1apN1M0, FIGO stage IIIc, MSI-H, probable RP node recurrence in 12/2017 -Diagnosed in 04/2017. Treated with hysterectomy, BSO, adjuvant radiation, and chemo with Carbotaxol. Currently on Keytruda every 3 weeks for presumed retroperitoneal lymph node metastasis. Tolerating well. -We dicussed her CT Cap from 10/25/18 which shows No evidence of metastatic disease.  I reviewed her scan images myself.  She is responding well to [facility]. She will continue to complete 2 years treatment. If no evidence of progression, plan to complete in 02/2020.  -Her CT scan also showed mild atelectasis of her lungs. She does experience mild coughing and choking with eating. I suggest she practice deep breathing to open lungs and if her choking or cough continues I suggest she get evaluated by speech therapist  3. HTN -f/u with PCP  4. Transaminitis  - Secondary to fatty liver disease   5.Osteopenia -DEXA scan in 05/2018 revealed T score of -2.0. -She is on monthly boniva, prescribed by her PCP, since 02/2018. Continue  -ContinueVitamin D and Calcium supplements. -will check her Vitamin D level every 6  months.    6. 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 Mainly in her feet -Continue B complex.She would like to hold off on Gabapentin at this time.  7. Fatigue  -She currently works part-time 16hr/week -She works at [employer] and stays active with exercise a few days a week -Doing well  8. Recent sinusitis  -improved, with mild residual dry cough -Encouraged her to use allergy medicine and nasal spray -Resolved,  I suggest she continue to use nasal spray or Flonase.   9. Hyperglycemia -She has no history of diabetes.  Her random blood glucose was 213, we discussed diabetic diet and exercise -Follow-up with her primary care physician.  Plan  -Labs reviewed and adequate to proceed with Keytruda today  -f/u and Keytruda in 3 weeks with labs, flush -May restart Neratinib this month    No problem-specific Assessment & Plan notes found for this encounter.   No orders of the defined types were placed in this encounter.  All questions were answered. The patient knows to call the clinic with any problems, questions or concerns. No barriers to learning was detected. I spent 20 minutes counseling the patient face to face. The total time spent in the appointment was 25 minutes and more than 50% was on counseling and review of test results     Truitt Merle, MD 10/27/2018   I, Joslyn Devon, am acting as scribe for Truitt Merle, MD.   I have reviewed the above documentation for accuracy and completeness, and I agree with the above.

## 2018-10-27 ENCOUNTER — Encounter: Payer: Self-pay | Admitting: Hematology

## 2018-10-27 ENCOUNTER — Inpatient Hospital Stay (HOSPITAL_BASED_OUTPATIENT_CLINIC_OR_DEPARTMENT_OTHER): Payer: Medicare Other | Admitting: Hematology

## 2018-10-27 ENCOUNTER — Inpatient Hospital Stay: Payer: Medicare Other

## 2018-10-27 ENCOUNTER — Inpatient Hospital Stay: Payer: Medicare Other | Attending: Hematology

## 2018-10-27 ENCOUNTER — Telehealth: Payer: Self-pay | Admitting: Hematology

## 2018-10-27 VITALS — BP 143/78 | HR 80 | Temp 97.9°F | Resp 18 | Ht 63.0 in | Wt 190.3 lb

## 2018-10-27 DIAGNOSIS — M8580 Other specified disorders of bone density and structure, unspecified site: Secondary | ICD-10-CM

## 2018-10-27 DIAGNOSIS — G629 Polyneuropathy, unspecified: Secondary | ICD-10-CM

## 2018-10-27 DIAGNOSIS — Z95828 Presence of other vascular implants and grafts: Secondary | ICD-10-CM

## 2018-10-27 DIAGNOSIS — C541 Malignant neoplasm of endometrium: Secondary | ICD-10-CM

## 2018-10-27 DIAGNOSIS — C50211 Malignant neoplasm of upper-inner quadrant of right female breast: Secondary | ICD-10-CM

## 2018-10-27 DIAGNOSIS — Z9221 Personal history of antineoplastic chemotherapy: Secondary | ICD-10-CM

## 2018-10-27 DIAGNOSIS — I10 Essential (primary) hypertension: Secondary | ICD-10-CM

## 2018-10-27 DIAGNOSIS — Z923 Personal history of irradiation: Secondary | ICD-10-CM | POA: Diagnosis not present

## 2018-10-27 DIAGNOSIS — C779 Secondary and unspecified malignant neoplasm of lymph node, unspecified: Secondary | ICD-10-CM | POA: Diagnosis not present

## 2018-10-27 DIAGNOSIS — K760 Fatty (change of) liver, not elsewhere classified: Secondary | ICD-10-CM | POA: Diagnosis not present

## 2018-10-27 DIAGNOSIS — Z170 Estrogen receptor positive status [ER+]: Secondary | ICD-10-CM

## 2018-10-27 DIAGNOSIS — Z79811 Long term (current) use of aromatase inhibitors: Secondary | ICD-10-CM | POA: Diagnosis not present

## 2018-10-27 DIAGNOSIS — Z90710 Acquired absence of both cervix and uterus: Secondary | ICD-10-CM | POA: Diagnosis not present

## 2018-10-27 DIAGNOSIS — Z800 Family history of malignant neoplasm of digestive organs: Secondary | ICD-10-CM

## 2018-10-27 DIAGNOSIS — Z452 Encounter for adjustment and management of vascular access device: Secondary | ICD-10-CM | POA: Diagnosis not present

## 2018-10-27 DIAGNOSIS — Z5112 Encounter for antineoplastic immunotherapy: Secondary | ICD-10-CM | POA: Diagnosis not present

## 2018-10-27 DIAGNOSIS — Z8052 Family history of malignant neoplasm of bladder: Secondary | ICD-10-CM

## 2018-10-27 DIAGNOSIS — M810 Age-related osteoporosis without current pathological fracture: Secondary | ICD-10-CM

## 2018-10-27 DIAGNOSIS — Z79899 Other long term (current) drug therapy: Secondary | ICD-10-CM

## 2018-10-27 LAB — CBC WITH DIFFERENTIAL/PLATELET
Abs Immature Granulocytes: 0.01 10*3/uL (ref 0.00–0.07)
BASOS ABS: 0 10*3/uL (ref 0.0–0.1)
Basophils Relative: 1 %
Eosinophils Absolute: 0.1 10*3/uL (ref 0.0–0.5)
Eosinophils Relative: 2 %
HCT: 38.6 % (ref 36.0–46.0)
Hemoglobin: 13.2 g/dL (ref 12.0–15.0)
Immature Granulocytes: 0 %
Lymphocytes Relative: 44 %
Lymphs Abs: 1.6 10*3/uL (ref 0.7–4.0)
MCH: 34.8 pg — ABNORMAL HIGH (ref 26.0–34.0)
MCHC: 34.2 g/dL (ref 30.0–36.0)
MCV: 101.8 fL — ABNORMAL HIGH (ref 80.0–100.0)
Monocytes Absolute: 0.3 10*3/uL (ref 0.1–1.0)
Monocytes Relative: 10 %
NRBC: 0 % (ref 0.0–0.2)
Neutro Abs: 1.5 10*3/uL — ABNORMAL LOW (ref 1.7–7.7)
Neutrophils Relative %: 43 %
PLATELETS: 141 10*3/uL — AB (ref 150–400)
RBC: 3.79 MIL/uL — ABNORMAL LOW (ref 3.87–5.11)
RDW: 11.9 % (ref 11.5–15.5)
WBC: 3.6 10*3/uL — AB (ref 4.0–10.5)

## 2018-10-27 LAB — COMPREHENSIVE METABOLIC PANEL
ALT: 95 U/L — ABNORMAL HIGH (ref 0–44)
ANION GAP: 11 (ref 5–15)
AST: 63 U/L — ABNORMAL HIGH (ref 15–41)
Albumin: 3.7 g/dL (ref 3.5–5.0)
Alkaline Phosphatase: 49 U/L (ref 38–126)
BUN: 12 mg/dL (ref 8–23)
CO2: 26 mmol/L (ref 22–32)
Calcium: 9 mg/dL (ref 8.9–10.3)
Chloride: 101 mmol/L (ref 98–111)
Creatinine, Ser: 0.83 mg/dL (ref 0.44–1.00)
GFR calc Af Amer: >60 mL/min (ref >60)
Glucose, Bld: 213 mg/dL — ABNORMAL HIGH (ref 70–99)
Potassium: 3.6 mmol/L (ref 3.5–5.1)
Sodium: 138 mmol/L (ref 135–145)
Total Bilirubin: 0.6 mg/dL (ref 0.3–1.2)
Total Protein: 6.9 g/dL (ref 6.5–8.1)

## 2018-10-27 MED ORDER — SODIUM CHLORIDE 0.9% FLUSH
10.0000 mL | INTRAVENOUS | Status: DC | PRN
  Administered 2018-10-27: 10 mL
  Filled 2018-10-27: qty 10

## 2018-10-27 MED ORDER — SODIUM CHLORIDE 0.9% FLUSH
10.0000 mL | INTRAVENOUS | Status: DC | PRN
  Administered 2018-10-27: 10 mL via INTRAVENOUS
  Filled 2018-10-27: qty 10

## 2018-10-27 MED ORDER — HEPARIN SOD (PORK) LOCK FLUSH 100 UNIT/ML IV SOLN
500.0000 [IU] | Freq: Once | INTRAVENOUS | Status: AC | PRN
  Administered 2018-10-27: 500 [IU]
  Filled 2018-10-27: qty 5

## 2018-10-27 MED ORDER — SODIUM CHLORIDE 0.9 % IV SOLN
200.0000 mg | Freq: Once | INTRAVENOUS | Status: AC
  Administered 2018-10-27: 200 mg via INTRAVENOUS
  Filled 2018-10-27: qty 8

## 2018-10-27 MED ORDER — SODIUM CHLORIDE 0.9 % IV SOLN
Freq: Once | INTRAVENOUS | Status: AC
  Administered 2018-10-27: 10:00:00 via INTRAVENOUS
  Filled 2018-10-27: qty 250

## 2018-10-27 NOTE — Progress Notes (Signed)
Per [name] okay to treat with elevated liver enzymes.

## 2018-10-27 NOTE — Telephone Encounter (Signed)
No los per 2/12.

## 2018-10-27 NOTE — Patient Instructions (Signed)
[facility] Discharge Instructions for Patients Receiving Chemotherapy  Today you received the following chemotherapy agent: Keytruda.  To help prevent nausea and vomiting after your treatment, we encourage you to take your nausea medication as directed.   If you develop nausea and vomiting that is not controlled by your nausea medication, call the clinic.   BELOW ARE SYMPTOMS THAT SHOULD BE REPORTED IMMEDIATELY:  *FEVER GREATER THAN 100.5 F  *CHILLS WITH OR WITHOUT FEVER  NAUSEA AND VOMITING THAT IS NOT CONTROLLED WITH YOUR NAUSEA MEDICATION  *UNUSUAL SHORTNESS OF BREATH  *UNUSUAL BRUISING OR BLEEDING  TENDERNESS IN MOUTH AND THROAT WITH OR WITHOUT PRESENCE OF ULCERS  *URINARY PROBLEMS  *BOWEL PROBLEMS  UNUSUAL RASH Items with * indicate a potential emergency and should be followed up as soon as possible.  Feel free to call the clinic should you have any questions or concerns. The clinic phone number is [phone].  Please show the CHEMO ALERT CARD at check-in to the Emergency Department and triage nurse.   

## 2018-11-08 ENCOUNTER — Encounter: Payer: Self-pay | Admitting: Hematology

## 2018-11-08 DIAGNOSIS — Z8041 Family history of malignant neoplasm of ovary: Secondary | ICD-10-CM | POA: Diagnosis not present

## 2018-11-08 DIAGNOSIS — R921 Mammographic calcification found on diagnostic imaging of breast: Secondary | ICD-10-CM | POA: Diagnosis not present

## 2018-11-08 DIAGNOSIS — Z8542 Personal history of malignant neoplasm of other parts of uterus: Secondary | ICD-10-CM | POA: Diagnosis not present

## 2018-11-08 DIAGNOSIS — Z853 Personal history of malignant neoplasm of breast: Secondary | ICD-10-CM | POA: Diagnosis not present

## 2018-11-15 NOTE — Progress Notes (Signed)
Dawn [name]   [phone] [phone]   Clinic Follow up Note   Patient Care Team: [name], MD as PCP - Haskell Riling, MD as Consulting Physician (General Surgery) Truitt Merle, MD as Consulting Physician (Hematology) [name], MD as Attending Physician (Radiation Oncology)  Date of Service:  11/17/2018  CHIEF COMPLAINT: F/u on breast and endometrial cancers  SUMMARY OF ONCOLOGIC HISTORY: Oncology History   MSI high Cancer Staging Endometrial cancer [facility]) Staging form: Corpus Uteri - Adenosarcoma, AJCC 8th Edition - Pathologic stage from 06/09/2017: Stage IIIC (pT1a, pN1, cM0) - Signed by Truitt Merle, MD on 06/16/2017  Malignant neoplasm of upper-inner quadrant of right breast in female, estrogen receptor positive ([place]) Staging form: Breast, AJCC 8th Edition - Clinical stage from 05/06/2017: Stage IA (cT1c, cN0, cM0, G2, ER: Positive, PR: Negative, HER2: Positive) - Unsigned - Pathologic stage from 05/25/2017: Stage IIA (pT2, pN0, cM0, G2, ER: Positive, PR: Negative, HER2: Negative) - Signed by Truitt Merle, MD on 06/16/2017       Malignant neoplasm of upper-inner quadrant of right breast in female, estrogen receptor positive ([place])   04/27/2017 Initial Biopsy    Diagnosis Breast, right, needle core biopsy INVASIVE DUCTAL CARCINOMA, GRADE 2 Microscopic Comment The neoplasm has intracellular mucin and signet ring cell features. Immunostains shows these cells are positive for ER,GATA3, ck7 and GCDFP, negative for ck20, cdx2, TTF-1 and pax8, The immunostaining pattern supports the neoplasm is breast primary.    04/27/2017 Receptors her2    Estrogen Receptor: 70%, POSITIVE, MODERATE STAINING INTENSITY Progesterone Receptor: 0%, NEGATIVE Proliferation Marker Ki67: 30% HER2 - **POSITIVE** RATIO OF HER2/CEP17 SIGNALS 2.91 AVERAGE HER2 COPY NUMBER PER CELL 7.28    04/27/2017 Initial Diagnosis    Malignant neoplasm of upper-inner  quadrant of right breast in female, estrogen receptor positive ([place])    05/07/2017 Imaging    CT Chest W Contrast 05/07/17 IMPRESSION: Tiny well-defined fatty lesion on the pleura at the right lung base. The thinner slice collimation used for today's chest CT eliminates volume-averaging seen in the lesion on the prior exam and confirms that this is a diffusely fatty nodule. This is a benign finding and likely represents a tiny lipoma. No defect in the hemidiaphragm evident to suggest tiny diaphragmatic hernia. Pulmonary hamartoma a consideration although the lack of soft tissue components makes this less likely.    05/15/2017 Genetic Testing    Patient had genetic testing due to a personal history of breast cancer and uterine cancer as well as a family history of cancer. The Multi-Cancer panel was ordered. The Multi-Cancer Panel offered by [facility] includes sequencing and/or deletion duplication testing of the following 83 genes: ALK, APC, ATM, AXIN2,BAP1,  BARD1, BLM, BMPR1A, BRCA1, BRCA2, BRIP1, CASR, CDC73, CDH1, CDK4, CDKN1B, CDKN1C, CDKN2A (p14ARF), CDKN2A (p16INK4a), CEBPA, CHEK2, CTNNA1, DICER1, DIS3L2, EGFR (c.2369C>T, p.Thr790Met variant only), EPCAM (Deletion/duplication testing only), FH, FLCN, GATA2, GPC3, GREM1 (Promoter region deletion/duplication testing only), HOXB13 (c.251G>A, p.Gly84Glu), HRAS, KIT, MAX, MEN1, MET, MITF (c.952G>A, p.Glu318Lys variant only), MLH1, MSH2, MSH3, MSH6, MUTYH, NBN, NF1, NF2, NTHL1, PALB2, PDGFRA, PHOX2B, PMS2, POLD1, POLE, POT1, PRKAR1A, PTCH1, PTEN, RAD50, RAD51C, RAD51D, RB1, RECQL4, RET, RUNX1, SDHAF2, SDHA (sequence changes only), SDHB, SDHC, SDHD, SMAD4, SMARCA4, SMARCB1, SMARCE1, STK11, SUFU, TERC, TERT, TMEM127, TP53, TSC1, TSC2, VHL, WRN and WT1.   Results: No pathogenic mutations were identified. A VUS in the GATA2 gene c.1348G>A (p.Gly450Arg) was identified.  The date of this test report is 05/25/2017.  05/25/2017 Surgery    RIGHT BREAST  LUMPECTOMY WITH RADIOACTIVE SEED AND  RIGHT AXILLARY SENTINEL LYMPH NODE BIOPSY and Pot placement by Dr. Excell Seltzer 05/25/17    05/25/2017 Pathology Results    Diagnosis 05/25/17 1. Breast, lumpectomy, right - INVASIVE DUCTAL CARCINOMA, GRADE II/III, SPANNING 2.2 CM. - DUCTAL CARCINOMA IN SITU, HIGH GRADE. - THE SURGICAL RESECTION MARGINS ARE NEGATIVE FOR CARCINOMA. - SEE ONCOLOGY TABLE BELOW. 2. Breast, excision, right additional superior margin - BENIGN FIBROADIPOSE TISSUE. - BENIGN SKELETAL MUSCLE. - SEE COMMENT. 3. Breast, excision, right additional lateral margin - BENIGN BREAST PARENCHYMA. - THERE IS NO EVIDENCE OF MALIGNANCY. - SEE COMMENT. 4. Breast, excision, chest wall margin - BENIGN FIBROADIPOSE TISSUE. - THERE IS NO EVIDENCE OF MALIGNANCY. - SEE COMMENT. 5. Lymph node, sentinel, biopsy, right axillary - THERE IS NO EVIDENCE OF CARCINOMA IN 1 OF 1 LYMPH NODE (0/1). 6. Lymph node, sentinel, biopsy, right axillary - THERE IS NO EVIDENCE OF CARCINOMA IN 1 OF 1 LYMPH NODE (0/1). 7. Lymph node, sentinel, biopsy, right axillary - THERE IS NO EVIDENCE OF CARCINOMA IN 1 OF 1 LYMPH NODE (0/1).    06/26/2017 PET scan    PET  IMPRESSION: 1. Postoperative findings both in the right breast and in the anatomic pelvis, with associated low-grade activity considered to be postoperative in nature. No hypermetabolic adenopathy or hypermetabolic lesions are identified to suggest active metastatic disease/malignancy. 2. Other imaging findings of potential clinical significance: Aortic Atherosclerosis (ICD10-I70.0). Sigmoid colon diverticulosis. Biapical pleuroparenchymal scarring in the lungs. Small amount of free pelvic fluid in the cul-de-sac, likely postoperative.     07/01/2017 - 10/14/2017 Chemotherapy    Adjuvant TCH with Onpro every 3 weeks for 6 cycles starting on 07/01/17, Changed Taxol to abraxane with cycle 4 due to drug rash reaction, followed by Herceptin every 3 weeks for  6 months     11/01/2017 - 12/08/2017 Radiation Therapy    Radiation therapy to her right breast with [name]    11/04/2017 - 06/2018 Chemotherapy    Maintenance Herceptin starting 11/04/17 and will comeplete her 12 months in 06/2018     12/23/2017 -  Anti-estrogen oral therapy    Adjuvant letrozole 2.5 mg daily started 12/23/17, switched to exemestane on 04/21/18 due to joint pain. Could not afford aromasin, started anastrozole 04/2018.     12/30/2017 Imaging    CT CAP W Contrast 12/30/17 IMPRESSION: Increased size of 1.1 cm aorto-caval retroperitoneal lymph node. This could be reactive due to interval hysterectomy, however metastatic carcinoma cannot be excluded. Consider continued attention on short-term follow-up CT, or PET-CT scan for further evaluation.  Mild hepatic steatosis.    01/25/2018 PET scan    IMPRESSION: 1. Enlarging hypermetabolic aortocaval lymph node is worrisome for metastatic disease. 2. Probable postoperative seroma in the medial right breast, with associated mild inflammatory hypermetabolism.    02/17/2018 -  Antibody Plan    Keytruda every 3 weeks starting 02/17/18. Plan for 2 years     04/28/2018 Mammogram    Probably benign. Calcifications in the right breast most likely are dystrophic, related to previous surgery, and are probably benign.     04/28/2018 Imaging    DEXA Scan T Score -2.0    06/10/2018 Echocardiogram    06/10/2018 ECHO LV EF: 55% -   60%    08/28/2018 - 08/2018 Chemotherapy    Neratinib '4mg'$  for 1 week then titrate up with 1 additional tablet weekly up to 12 mg starting 08/28/18. Stopped soon after  starting due to diarrhea.    10/25/2018 Imaging    CT CAP 10/25/18  IMPRESSION: 1. No evidence of metastatic disease. 2.  Aortic atherosclerosis (ICD10-170.0).     Endometrial adenocarcinoma ([place])   05/07/2017 Initial Diagnosis    Endometrial adenocarcinoma ([place])    06/09/2017 Surgery    XI ROBOTIC ASSISTED TOTAL LAPOROSCOPIC HYSTERECTOMY WITH  BILATERAL SALPINGO OOPHORECTOMY and SENTINEL NODE BIOPSY by Dr. Denman George 06/09/17    06/09/2017 Pathology Results    Diagnosis 06/09/17 1. Lymph node, sentinel, biopsy, right external iliac - METASTATIC ADENOCARCINOMA IN ONE LYMPH NODE (1/1). 2. Lymph node, sentinel, biopsy, left obturator - ONE BENIGN LYMPH NODE (0/1). 3. Lymph node, sentinel, biopsy, left external iliac - METASTATIC ADENOCARCINOMA IN ONE LYMPH NODE (1/1). 4. Uterus +/- tubes/ovaries, neoplastic ENDOMETRIUM: - ENDOMETRIAL ADENOCARCINOMA, 3.2 CM. - CARCINOMA INVADES INNER HALF OF MYOMETRIUM. 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 - CERVIX, BILATERAL FALLOPIAN TUBES AND BILATERAL OVARIES FREE OF TUMOR    08/18/2017 - 09/17/2017 Radiation Therapy    vaginal brachytherapy per 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    02/17/2018 -  Chemotherapy    Keytruda every 3 weeks starting 02/17/18      CURRENT THERAPY:  -Keytruda every 3 weeksstarting 02/17/18.  -Letrozole 2.'5mg'$  daily starting 12/23/17. Switched to Anastrozole in 04/2018 due to joint pain  INTERVAL HISTORY:  Dawn [name] is here for a follow up and treatment. She presents to the clinic today with her husband. She notes she is doing well.  She notes she did not try Nerlynx again. She is tolerating Letrozole well.  She notes left posterior shoulder pain which is constant. This has been going on since last month and she attributes to her exercising and possible pulled muscle. She notes she has tried a massage to help. Her pain is 3/10.      REVIEW OF SYSTEMS:   Constitutional: Denies fevers, chills or abnormal weight loss Eyes: Denies blurriness of vision Ears, nose, mouth, throat, and face: Denies mucositis or sore throat Respiratory: Denies cough, dyspnea or wheezes Cardiovascular: Denies palpitation, chest discomfort or lower  extremity swelling Gastrointestinal:  Denies nausea, heartburn or change in bowel habits Skin: Denies abnormal skin rashes MSK: (+) Left posterior shoulder pain  Lymphatics: Denies new lymphadenopathy or easy bruising Neurological:Denies numbness, tingling or new weaknesses Behavioral/Psych: Mood is stable, no new changes  All other systems were reviewed with the patient and are negative.  MEDICAL HISTORY:  Past Medical History:  Diagnosis Date  . Anemia    as a child.  . Arthritis   . Bilateral cataracts   . Bilateral leg cramps   . Cancer ([place])    skin  . Dyspnea   . Family history of bladder cancer   . Family history of colon cancer in father   . Fuchs' corneal dystrophy   . GERD (gastroesophageal reflux disease)   . Hearing loss    Right side 30%  . History of hiatal hernia    small size  . History of radiation therapy 08/21/17, 08/28/17,09/01/17, 09/11/17, 09/17/17   Vaginal cuff brachytherapy.   Marland Kitchen History of radiation therapy 11/11/17- 12/08/17   Right Breast treated to 40.05 Gy with 15 fx of 2.67 Gy and a boost of 10 Gy with 5 fx.   . Hyperlipidemia   . Hypertension   . IBS (irritable  bowel syndrome)    hx of  . Malignant neoplasm of upper-inner quadrant of right female breast ([place]) 04/2017  . NAFL (nonalcoholic fatty liver)   . Obesity   . PONV (postoperative nausea and vomiting)   . 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 Uterine cancer ([place]) 04/2017   endometrial cancer  . Varices, gastric   . Vertigo     SURGICAL HISTORY: Past Surgical History:  Procedure Laterality Date  . BREAST BIOPSY Right 04/27/2017  . BREAST LUMPECTOMY WITH RADIOACTIVE SEED AND SENTINEL LYMPH NODE BIOPSY Right 05/25/2017   Procedure: RIGHT BREAST LUMPECTOMY WITH RADIOACTIVE SEED AND  RIGHT AXILLARY SENTINEL LYMPH NODE BIOPSY;  Surgeon: Excell Seltzer, MD;  Location: [place];  Service: General;  Laterality: Right;  . CATARACT  EXTRACTION, BILATERAL    . COLONOSCOPY    . HYSTEROSCOPY W/D&C N/A 04/29/2017   Procedure: DILATATION AND CURETTAGE /HYSTEROSCOPY;  Surgeon: Linda Hedges, DO;  Location: [facility];  Service: Gynecology;  Laterality: N/A;  . PORTACATH PLACEMENT Left 05/25/2017   Procedure: INSERTION PORT-A-CATH;  Surgeon: Excell Seltzer, MD;  Location: [place];  Service: General;  Laterality: Left;  . ROBOTIC ASSISTED TOTAL HYSTERECTOMY WITH BILATERAL SALPINGO OOPHERECTOMY N/A 06/09/2017   Procedure: XI ROBOTIC ASSISTED TOTAL LAPOROSCOPIC HYSTERECTOMY WITH BILATERAL SALPINGO OOPHORECTOMY;  Surgeon: Everitt Amber, MD;  Location: [facility];  Service: Gynecology;  Laterality: N/A;  . SENTINEL NODE BIOPSY N/A 06/09/2017   Procedure: SENTINEL NODE BIOPSY;  Surgeon: Everitt Amber, MD;  Location: [facility];  Service: Gynecology;  Laterality: N/A;    I have reviewed the social history and family history with the patient and they are unchanged from previous note.  ALLERGIES:  is allergic to ciprofloxacin; crestor [rosuvastatin calcium]; fosamax [alendronate sodium]; penicillins; and taxol [paclitaxel].  MEDICATIONS:  Current Outpatient Medications  Medication Sig Dispense Refill  . acetaminophen (TYLENOL) 500 MG tablet Take 500 mg by mouth every 6 (six) hours as needed for moderate pain.    Marland Kitchen anastrozole (ARIMIDEX) 1 MG tablet TAKE 1 TABLET BY MOUTH  DAILY 30 tablet 3  . Ascorbic Acid (VITAMIN C) 1000 MG tablet Take 1,000 mg by mouth daily.    Marland Kitchen b complex vitamins capsule Take 1 capsule by mouth daily.    . Calcium Carbonate-Vitamin D3 (CALCIUM 600-D) 600-400 MG-UNIT TABS Take 1-2 tablets by mouth daily.    . diphenhydrAMINE (BENADRYL) 25 MG tablet Take 25-50 mg by mouth every 6 (six) hours as needed for sleep.    Marland Kitchen ibandronate (BONIVA) 150 MG tablet Take in the morning, once a month,  with a full glass of water,on an empty stomach,do not take anything else by mouth or lie down for the next 30 min. 3 tablet 3    . ibuprofen (ADVIL,MOTRIN) 200 MG tablet Take 200 mg by mouth every 6 (six) hours as needed.    . lidocaine-prilocaine (EMLA) cream   2  . Multiple Vitamin (MULTIVITAMIN) capsule Take 1 capsule by mouth daily.      Marland Kitchen olmesartan-hydrochlorothiazide (BENICAR HCT) 20-12.5 MG tablet Take 1 tablet by mouth daily. 90 tablet 2  . prednisoLONE acetate (PRED FORTE) 1 % ophthalmic suspension Place 1 drop into the left eye 3 (three) times daily.    . sodium chloride (MURO 128) 5 % ophthalmic solution Place 2 drops into both eyes daily.    Marland Kitchen triamcinolone cream (KENALOG) 0.1 % APPLY TO ITCHY SPOTS ONCE DAILY  1  . Budesonide  ER 9 MG TB24 Take 9 mg by mouth daily. (Patient not taking: Reported on 11/17/2018) 30 tablet 2  . diphenoxylate-atropine (LOMOTIL) 2.5-0.025 MG tablet Take 1-2 tablets by mouth 4 (four) times daily as needed for diarrhea or loose stools. (Patient not taking: Reported on 11/17/2018) 60 tablet 1  . loperamide (IMODIUM) 2 MG capsule Take 2 capsules (4 mg total) by mouth 3 (three) times daily. May take additional 2-'4mg'$  as needed for breakthrough diarrhea. (Patient not taking: Reported on 11/17/2018) 200 capsule 2  . meclizine (ANTIVERT) 25 MG tablet Take 1-2 tablets (25-50 mg total) by mouth 3 (three) times daily as needed for dizziness. (Patient not taking: Reported on 11/17/2018) 30 tablet 0  . Neratinib Maleate (NERLYNX) 40 MG tablet Week1: Take 4 tabs ('160mg'$ ) by mouth once daily. TFTD3: Take 5 tab ('200mg'$ ) daily. UKGU5 & on: Take 6 tab ('240mg'$ ) daily. Take with food. (Patient not taking: Reported on 11/17/2018) 160 tablet 0  . Pitavastatin Calcium (LIVALO) 2 MG TABS Take 1.5 tablets (3 mg total) by mouth every evening. Takes 1.5 tablet (Patient not taking: Reported on 11/17/2018) 135 tablet 3   No current facility-administered medications for this visit.    Facility-Administered Medications Ordered in Other Visits  Medication Dose Route Frequency Provider Last Rate Last Dose  . heparin lock flush 100  unit/mL  500 Units Intracatheter Once PRN Truitt Merle, MD      . pembrolizumab [facility]) 200 mg in sodium chloride 0.9 % 50 mL chemo infusion  200 mg Intravenous Once Truitt Merle, MD      . sodium chloride flush (NS) 0.9 % injection 10 mL  10 mL Intracatheter PRN Truitt Merle, MD   10 mL at 08/25/18 1111  . sodium chloride flush (NS) 0.9 % injection 10 mL  10 mL Intracatheter PRN Truitt Merle, MD        PHYSICAL EXAMINATION: ECOG PERFORMANCE STATUS: 0 - Asymptomatic  Vitals:   11/17/18 0839  BP: 115/80  Pulse: 73  Resp: 18  Temp: 97.8 F (36.6 C)  SpO2: 98%   Filed Weights   11/17/18 0839  Weight: 191 lb 3.2 oz (86.7 kg)    GENERAL:alert, no distress and comfortable SKIN: skin color, texture, turgor are normal, no rashes or significant lesions EYES: normal, Conjunctiva are pink and non-injected, sclera clear OROPHARYNX:no exudate, no erythema and lips, buccal mucosa, and tongue normal  NECK: supple, thyroid normal size, non-tender, without nodularity LYMPH:  no palpable lymphadenopathy in the cervical, axillary or inguinal LUNGS: clear to auscultation and percussion with normal breathing effort HEART: regular rate & rhythm and no murmurs and no lower extremity edema ABDOMEN:abdomen soft, non-tender and normal bowel sounds Musculoskeletal:no cyanosis of digits and no clubbing  NEURO: alert & oriented x 3 with fluent speech, no focal motor/sensory deficits  LABORATORY DATA:  I have reviewed the data as listed CBC Latest Ref Rng & Units 11/17/2018 10/27/2018 10/05/2018  WBC 4.0 - 10.5 K/uL 3.7(L) 3.6(L) 4.6  Hemoglobin 12.0 - 15.0 g/dL 13.3 13.2 13.3  Hematocrit 36.0 - 46.0 % 38.2 38.6 38.1  Platelets 150 - 400 K/uL 144(L) 141(L) 172     CMP Latest Ref Rng & Units 11/17/2018 10/27/2018 10/05/2018  Glucose 70 - 99 mg/dL 191(H) 213(H) 112(H)  BUN 8 - 23 mg/dL '10 12 11  '$ Creatinine 0.44 - 1.00 mg/dL 0.83 0.83 0.76  Sodium 135 - 145 mmol/L 135 138 137  Potassium 3.5 - 5.1 mmol/L 3.6 3.6 3.5    Chloride 98 -  111 mmol/L 99 101 101  CO2 22 - 32 mmol/L '27 26 28  '$ Calcium 8.9 - 10.3 mg/dL 8.8(L) 9.0 9.3  Total Protein 6.5 - 8.1 g/dL 7.0 6.9 7.3  Total Bilirubin 0.3 - 1.2 mg/dL 0.7 0.6 0.6  Alkaline Phos 38 - 126 U/L 50 49 50  AST 15 - 41 U/L 70(H) 63(H) 58(H)  ALT 0 - 44 U/L 107(H) 95(H) 96(H)      RADIOGRAPHIC STUDIES: I have personally reviewed the radiological images as listed and agreed with the findings in the report. No results found.   ASSESSMENT & PLAN:  Dawn [name] is a 68 y.o. female with    1. 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 Treated with right breast lumpectomy, adjuvant chemo and radiation. She tried Nerlynx but could not tolerate due to diarrhea.  -She started anti-estrogen therapy with letrozole. Due to joint pain I switched her to Anastrozole in 04/2018. Tolerating well.  -She tried neratinib, had a severe diarrhea, and does not want try again. -She is clinically doing well, exam was unremarkable, mammogram from last week was unremarkable.  No clinical evidence of recurrence -F/u in 9 weeks   2. Endometrial adenocarcinomawith lymph node metastasis, pT1apN1M0, FIGO stage IIIc, MSI-H, probable RP node recurrence in 12/2017 -Diagnosed in 04/2017. Treated with hysterectomy, BSO, adjuvant radiation, and chemo with Carbotaxol. Currently on Keytruda every 3 weeksfor presumed retroperitoneal lymph node metastasis. Tolerating well. -Latest CT Cap from 10/25/18 which shows No evidence of metastatic disease.  I reviewed her scan images myself.  She is responding well to [facility]. She will continue to complete 2 years treatment. If no evidence of progression, plan to complete in 02/2020.  -She is currently on antibody Keytruda every 3 weeks since 02/17/18. Tolerating well.  -She is clinically doing very well. Labs reviewed, WBC at 3.7, PLT at 144K,CMP unremarkable  except mild transaminitis which is stable. TSH and Vitamin D still pending. Overall adequate to proceed with Keytruda today.  3. HTN -Controlled, Continue to f/u with PCP   4. Transaminitis -Secondary to fatty liver disease -Stable  5.Osteopenia -DEXA scan in 05/2018 revealed T score of -2.0. -She is on monthly boniva, prescribed by her PCP, since 02/2018. Continue  -ContinueVitamin D and Calcium supplements. -will check her Vitamin D level every 6 months.   6. Neuropathy G1  -Secondary to previous chemotherapy.Mainly in her feet -Continue B complex.She would like to hold off on Gabapentin at this time. -Not mentioned today, likely resolved.   9. Hyperglycemia -She has no history of diabetes.  Her random blood glucose was 213, we discussed diabetic diet and exercise -Follow-up with her primary care physician. -BG at 191 today   10. Acute Left shoulder pain  -Secondary to exercise  -I recommend she not do weight lifting with that shoulder for now  -She can continue heating pad and OTC pain medication  -If not resolved in a few weeks, I recommend she return to PT   Plan -Labs reviewed and adequate to proceed with Keytruda today  -Keytruda every 3 weeksX4 with lab and flush in 6, 12 weeks  -Lab, flush and F/u in 9 weeks    No problem-specific Assessment & Plan notes found for this encounter.   No orders of the defined types were placed in this encounter.  All questions were answered. The patient knows to call the clinic with any problems, questions or concerns. No barriers  to learning was detected. I spent 20 minutes counseling the patient face to face. The total time spent in the appointment was 25 minutes and more than 50% was on counseling and review of test results     Truitt Merle, MD 11/17/2018   I, Joslyn Devon, am acting as scribe for Truitt Merle, MD.   I have reviewed the above documentation for accuracy and completeness, and I agree with the above.

## 2018-11-17 ENCOUNTER — Inpatient Hospital Stay: Payer: Medicare Other

## 2018-11-17 ENCOUNTER — Encounter: Payer: Self-pay | Admitting: Hematology

## 2018-11-17 ENCOUNTER — Telehealth: Payer: Self-pay | Admitting: Hematology

## 2018-11-17 ENCOUNTER — Inpatient Hospital Stay (HOSPITAL_BASED_OUTPATIENT_CLINIC_OR_DEPARTMENT_OTHER): Payer: Medicare Other | Admitting: Hematology

## 2018-11-17 ENCOUNTER — Inpatient Hospital Stay: Payer: Medicare Other | Attending: Hematology

## 2018-11-17 ENCOUNTER — Other Ambulatory Visit: Payer: Self-pay

## 2018-11-17 VITALS — BP 115/80 | HR 73 | Temp 97.8°F | Resp 18 | Ht 63.0 in | Wt 191.2 lb

## 2018-11-17 DIAGNOSIS — I10 Essential (primary) hypertension: Secondary | ICD-10-CM | POA: Insufficient documentation

## 2018-11-17 DIAGNOSIS — Z79811 Long term (current) use of aromatase inhibitors: Secondary | ICD-10-CM | POA: Insufficient documentation

## 2018-11-17 DIAGNOSIS — M810 Age-related osteoporosis without current pathological fracture: Secondary | ICD-10-CM

## 2018-11-17 DIAGNOSIS — R740 Nonspecific elevation of levels of transaminase and lactic acid dehydrogenase [LDH]: Secondary | ICD-10-CM | POA: Diagnosis not present

## 2018-11-17 DIAGNOSIS — Z170 Estrogen receptor positive status [ER+]: Secondary | ICD-10-CM

## 2018-11-17 DIAGNOSIS — C779 Secondary and unspecified malignant neoplasm of lymph node, unspecified: Secondary | ICD-10-CM | POA: Diagnosis not present

## 2018-11-17 DIAGNOSIS — C541 Malignant neoplasm of endometrium: Secondary | ICD-10-CM | POA: Insufficient documentation

## 2018-11-17 DIAGNOSIS — Z5112 Encounter for antineoplastic immunotherapy: Secondary | ICD-10-CM | POA: Insufficient documentation

## 2018-11-17 DIAGNOSIS — G620 Drug-induced polyneuropathy: Secondary | ICD-10-CM | POA: Insufficient documentation

## 2018-11-17 DIAGNOSIS — Z95828 Presence of other vascular implants and grafts: Secondary | ICD-10-CM

## 2018-11-17 DIAGNOSIS — M8580 Other specified disorders of bone density and structure, unspecified site: Secondary | ICD-10-CM | POA: Diagnosis not present

## 2018-11-17 DIAGNOSIS — Z79899 Other long term (current) drug therapy: Secondary | ICD-10-CM | POA: Insufficient documentation

## 2018-11-17 DIAGNOSIS — C50211 Malignant neoplasm of upper-inner quadrant of right female breast: Secondary | ICD-10-CM | POA: Diagnosis not present

## 2018-11-17 DIAGNOSIS — Z923 Personal history of irradiation: Secondary | ICD-10-CM

## 2018-11-17 DIAGNOSIS — R739 Hyperglycemia, unspecified: Secondary | ICD-10-CM

## 2018-11-17 DIAGNOSIS — R7401 Elevation of levels of liver transaminase levels: Secondary | ICD-10-CM

## 2018-11-17 LAB — CBC WITH DIFFERENTIAL/PLATELET
Abs Immature Granulocytes: 0 10*3/uL (ref 0.00–0.07)
BASOS PCT: 1 %
Basophils Absolute: 0 10*3/uL (ref 0.0–0.1)
Eosinophils Absolute: 0.1 10*3/uL (ref 0.0–0.5)
Eosinophils Relative: 2 %
HCT: 38.2 % (ref 36.0–46.0)
Hemoglobin: 13.3 g/dL (ref 12.0–15.0)
Immature Granulocytes: 0 %
Lymphocytes Relative: 43 %
Lymphs Abs: 1.6 10*3/uL (ref 0.7–4.0)
MCH: 35.2 pg — ABNORMAL HIGH (ref 26.0–34.0)
MCHC: 34.8 g/dL (ref 30.0–36.0)
MCV: 101.1 fL — ABNORMAL HIGH (ref 80.0–100.0)
Monocytes Absolute: 0.3 10*3/uL (ref 0.1–1.0)
Monocytes Relative: 9 %
NRBC: 0 % (ref 0.0–0.2)
Neutro Abs: 1.7 10*3/uL (ref 1.7–7.7)
Neutrophils Relative %: 45 %
PLATELETS: 144 10*3/uL — AB (ref 150–400)
RBC: 3.78 MIL/uL — ABNORMAL LOW (ref 3.87–5.11)
RDW: 11.9 % (ref 11.5–15.5)
WBC: 3.7 10*3/uL — ABNORMAL LOW (ref 4.0–10.5)

## 2018-11-17 LAB — COMPREHENSIVE METABOLIC PANEL
ALT: 107 U/L — ABNORMAL HIGH (ref 0–44)
AST: 70 U/L — ABNORMAL HIGH (ref 15–41)
Albumin: 3.7 g/dL (ref 3.5–5.0)
Alkaline Phosphatase: 50 U/L (ref 38–126)
Anion gap: 9 (ref 5–15)
BUN: 10 mg/dL (ref 8–23)
CO2: 27 mmol/L (ref 22–32)
Calcium: 8.8 mg/dL — ABNORMAL LOW (ref 8.9–10.3)
Chloride: 99 mmol/L (ref 98–111)
Creatinine, Ser: 0.83 mg/dL (ref 0.44–1.00)
GFR calc Af Amer: >60 mL/min (ref >60)
GFR calc non Af Amer: >60 mL/min (ref >60)
Glucose, Bld: 191 mg/dL — ABNORMAL HIGH (ref 70–99)
Potassium: 3.6 mmol/L (ref 3.5–5.1)
Sodium: 135 mmol/L (ref 135–145)
Total Bilirubin: 0.7 mg/dL (ref 0.3–1.2)
Total Protein: 7 g/dL (ref 6.5–8.1)

## 2018-11-17 LAB — TSH: TSH: 4.177 u[IU]/mL — AB (ref 0.308–3.960)

## 2018-11-17 MED ORDER — SODIUM CHLORIDE 0.9 % IV SOLN
Freq: Once | INTRAVENOUS | Status: AC
  Administered 2018-11-17: 09:00:00 via INTRAVENOUS
  Filled 2018-11-17: qty 250

## 2018-11-17 MED ORDER — SODIUM CHLORIDE 0.9% FLUSH
10.0000 mL | Freq: Once | INTRAVENOUS | Status: AC
  Administered 2018-11-17: 10 mL
  Filled 2018-11-17: qty 10

## 2018-11-17 MED ORDER — SODIUM CHLORIDE 0.9% FLUSH
10.0000 mL | INTRAVENOUS | Status: DC | PRN
  Administered 2018-11-17: 10 mL
  Filled 2018-11-17: qty 10

## 2018-11-17 MED ORDER — SODIUM CHLORIDE 0.9 % IV SOLN
200.0000 mg | Freq: Once | INTRAVENOUS | Status: AC
  Administered 2018-11-17: 200 mg via INTRAVENOUS
  Filled 2018-11-17: qty 8

## 2018-11-17 MED ORDER — HEPARIN SOD (PORK) LOCK FLUSH 100 UNIT/ML IV SOLN
500.0000 [IU] | Freq: Once | INTRAVENOUS | Status: AC | PRN
  Administered 2018-11-17: 500 [IU]
  Filled 2018-11-17: qty 5

## 2018-11-17 NOTE — Telephone Encounter (Signed)
Gave avs and calendar ° °

## 2018-11-17 NOTE — Progress Notes (Signed)
Per [name] it is ok to treat with Keytruda today with ALT of 107

## 2018-11-17 NOTE — Patient Instructions (Signed)
[facility] Discharge Instructions for Patients Receiving Chemotherapy  Today you received the following chemotherapy agents:  Keytruda.  To help prevent nausea and vomiting after your treatment, we encourage you to take your nausea medication as directed.   If you develop nausea and vomiting that is not controlled by your nausea medication, call the clinic.   BELOW ARE SYMPTOMS THAT SHOULD BE REPORTED IMMEDIATELY:  *FEVER GREATER THAN 100.5 F  *CHILLS WITH OR WITHOUT FEVER  NAUSEA AND VOMITING THAT IS NOT CONTROLLED WITH YOUR NAUSEA MEDICATION  *UNUSUAL SHORTNESS OF BREATH  *UNUSUAL BRUISING OR BLEEDING  TENDERNESS IN MOUTH AND THROAT WITH OR WITHOUT PRESENCE OF ULCERS  *URINARY PROBLEMS  *BOWEL PROBLEMS  UNUSUAL RASH Items with * indicate a potential emergency and should be followed up as soon as possible.  Feel free to call the clinic should you have any questions or concerns. The clinic phone number is [phone].  Please show the CHEMO ALERT CARD at check-in to the Emergency Department and triage nurse.    

## 2018-11-18 ENCOUNTER — Other Ambulatory Visit: Payer: Self-pay | Admitting: Hematology

## 2018-11-18 DIAGNOSIS — C541 Malignant neoplasm of endometrium: Secondary | ICD-10-CM

## 2018-11-18 DIAGNOSIS — E039 Hypothyroidism, unspecified: Secondary | ICD-10-CM

## 2018-11-18 LAB — VITAMIN D 25 HYDROXY (VIT D DEFICIENCY, FRACTURES): Vit D, 25-Hydroxy: 30.9 ng/mL (ref 30.0–100.0)

## 2018-12-06 ENCOUNTER — Ambulatory Visit: Payer: Medicare Other | Admitting: Family Medicine

## 2018-12-08 ENCOUNTER — Other Ambulatory Visit: Payer: Self-pay

## 2018-12-08 ENCOUNTER — Inpatient Hospital Stay: Payer: Medicare Other

## 2018-12-08 VITALS — BP 113/83 | HR 78 | Temp 98.3°F | Resp 18

## 2018-12-08 DIAGNOSIS — Z5112 Encounter for antineoplastic immunotherapy: Secondary | ICD-10-CM | POA: Diagnosis not present

## 2018-12-08 DIAGNOSIS — C541 Malignant neoplasm of endometrium: Secondary | ICD-10-CM | POA: Diagnosis not present

## 2018-12-08 DIAGNOSIS — C779 Secondary and unspecified malignant neoplasm of lymph node, unspecified: Secondary | ICD-10-CM | POA: Diagnosis not present

## 2018-12-08 DIAGNOSIS — C50211 Malignant neoplasm of upper-inner quadrant of right female breast: Secondary | ICD-10-CM | POA: Diagnosis not present

## 2018-12-08 DIAGNOSIS — M8580 Other specified disorders of bone density and structure, unspecified site: Secondary | ICD-10-CM | POA: Diagnosis not present

## 2018-12-08 DIAGNOSIS — R740 Nonspecific elevation of levels of transaminase and lactic acid dehydrogenase [LDH]: Secondary | ICD-10-CM | POA: Diagnosis not present

## 2018-12-08 MED ORDER — SODIUM CHLORIDE 0.9 % IV SOLN
Freq: Once | INTRAVENOUS | Status: AC
  Administered 2018-12-08: 09:00:00 via INTRAVENOUS
  Filled 2018-12-08: qty 250

## 2018-12-08 MED ORDER — SODIUM CHLORIDE 0.9% FLUSH
10.0000 mL | INTRAVENOUS | Status: DC | PRN
  Administered 2018-12-08: 10 mL
  Filled 2018-12-08: qty 10

## 2018-12-08 MED ORDER — SODIUM CHLORIDE 0.9 % IV SOLN
200.0000 mg | Freq: Once | INTRAVENOUS | Status: AC
  Administered 2018-12-08: 200 mg via INTRAVENOUS
  Filled 2018-12-08: qty 8

## 2018-12-08 MED ORDER — HEPARIN SOD (PORK) LOCK FLUSH 100 UNIT/ML IV SOLN
500.0000 [IU] | Freq: Once | INTRAVENOUS | Status: AC | PRN
  Administered 2018-12-08: 500 [IU]
  Filled 2018-12-08: qty 5

## 2018-12-08 NOTE — Patient Instructions (Signed)
Coronavirus (COVID-19) Are you at risk?  Are you at risk for the Coronavirus (COVID-19)?  To be considered HIGH RISK for Coronavirus (COVID-19), you have to meet the following criteria:  . Traveled to [place], [place], [name], [place] or [place]; or in the [place] to [place], [place], [place], or [place]; and have fever, cough, and shortness of breath within the last 2 weeks of travel OR . Been in close contact with a person diagnosed with COVID-19 within the last 2 weeks and have fever, cough, and shortness of breath . IF YOU DO NOT MEET THESE CRITERIA, YOU ARE CONSIDERED LOW RISK FOR COVID-19.  What to do if you are HIGH RISK for COVID-19?  . If you are having a medical emergency, call 911. . Seek medical care right away. Before you go to a doctor's office, urgent care or emergency department, call ahead and tell them about your recent travel, contact with someone diagnosed with COVID-19, and your symptoms. You should receive instructions from your physician's office regarding next steps of care.  . When you arrive at healthcare provider, tell the healthcare staff immediately you have returned from visiting [place], [place], [place], [place] or [name]; or traveled in the [place] to [place], [place], [place], or [place]; in the last two weeks or you have been in close contact with a person diagnosed with COVID-19 in the last 2 weeks.   . Tell the health care staff about your symptoms: fever, cough and shortness of breath. . After you have been seen by a medical provider, you will be either: o Tested for (COVID-19) and discharged home on quarantine except to seek medical care if symptoms worsen, and asked to  - Stay home and avoid contact with others until you get your results (4-5 days)  - Avoid travel on public transportation if possible (such as bus, train, or airplane) or o Sent to the Emergency Department by EMS for evaluation, COVID-19 testing, and possible  admission depending on your condition and test results.  What to do if you are LOW RISK for COVID-19?  Reduce your risk of any infection by using the same precautions used for avoiding the common cold or flu:  . Wash your hands often with soap and warm water for at least 20 seconds.  If soap and water are not readily available, use an alcohol-based hand sanitizer with at least 60% alcohol.  . If coughing or sneezing, cover your mouth and nose by coughing or sneezing into the elbow areas of your shirt or coat, into a tissue or into your sleeve (not your hands). . Avoid shaking hands with others and consider head nods or verbal greetings only. . Avoid touching your eyes, nose, or mouth with unwashed hands.  . Avoid close contact with people who are sick. . Avoid places or events with large numbers of people in one location, like concerts or sporting events. . Carefully consider travel plans you have or are making. . If you are planning any travel outside or inside the [place], visit the CDC's Travelers' Health webpage for the latest health notices. . If you have some symptoms but not all symptoms, continue to monitor at home and seek medical attention if your symptoms worsen. . If you are having a medical emergency, call 911.   ADDITIONAL HEALTHCARE OPTIONS FOR PATIENTS  [facility] / e-Visit: [email or web address]         [facility]: [phone]  [facility]: [phone]                   [facility]: [phone]   [facility] Discharge Instructions for Patients Receiving Chemotherapy  Today you received the following chemotherapy agents Keytruda  To help prevent nausea and vomiting after your treatment, we encourage you to take your nausea medication as directed.    If you develop nausea and vomiting that is not controlled by your nausea medication, call the clinic.   BELOW ARE  SYMPTOMS THAT SHOULD BE REPORTED IMMEDIATELY:  *FEVER GREATER THAN 100.5 F  *CHILLS WITH OR WITHOUT FEVER  NAUSEA AND VOMITING THAT IS NOT CONTROLLED WITH YOUR NAUSEA MEDICATION  *UNUSUAL SHORTNESS OF BREATH  *UNUSUAL BRUISING OR BLEEDING  TENDERNESS IN MOUTH AND THROAT WITH OR WITHOUT PRESENCE OF ULCERS  *URINARY PROBLEMS  *BOWEL PROBLEMS  UNUSUAL RASH Items with * indicate a potential emergency and should be followed up as soon as possible.  Feel free to call the clinic should you have any questions or concerns. The clinic phone number is [phone].  Please show the CHEMO ALERT CARD at check-in to the Emergency Department and triage nurse.   

## 2018-12-08 NOTE — Progress Notes (Signed)
OK to treat with last labs on 11/17/2018 per Drucie Ip, RN per MD Burr Medico

## 2018-12-23 MED FILL — OLMESARTAN-HCTZ 20-12.5 MG: 20-12.5 | 90 days supply | Qty: 90 | Fill #0

## 2018-12-27 ENCOUNTER — Other Ambulatory Visit: Payer: Self-pay | Admitting: Hematology

## 2018-12-29 ENCOUNTER — Other Ambulatory Visit: Payer: Self-pay

## 2018-12-29 ENCOUNTER — Inpatient Hospital Stay: Payer: Medicare Other

## 2018-12-29 ENCOUNTER — Other Ambulatory Visit: Payer: Self-pay | Admitting: Hematology

## 2018-12-29 ENCOUNTER — Inpatient Hospital Stay: Payer: Medicare Other | Attending: Hematology

## 2018-12-29 ENCOUNTER — Encounter: Payer: Self-pay | Admitting: *Deleted

## 2018-12-29 VITALS — BP 118/58 | HR 75 | Temp 98.0°F | Resp 16

## 2018-12-29 DIAGNOSIS — Z79899 Other long term (current) drug therapy: Secondary | ICD-10-CM | POA: Insufficient documentation

## 2018-12-29 DIAGNOSIS — C541 Malignant neoplasm of endometrium: Secondary | ICD-10-CM | POA: Diagnosis not present

## 2018-12-29 DIAGNOSIS — C50211 Malignant neoplasm of upper-inner quadrant of right female breast: Secondary | ICD-10-CM | POA: Diagnosis not present

## 2018-12-29 DIAGNOSIS — Z170 Estrogen receptor positive status [ER+]: Secondary | ICD-10-CM | POA: Diagnosis not present

## 2018-12-29 DIAGNOSIS — Z5112 Encounter for antineoplastic immunotherapy: Secondary | ICD-10-CM | POA: Diagnosis not present

## 2018-12-29 DIAGNOSIS — Z95828 Presence of other vascular implants and grafts: Secondary | ICD-10-CM

## 2018-12-29 DIAGNOSIS — E039 Hypothyroidism, unspecified: Secondary | ICD-10-CM

## 2018-12-29 LAB — T4, FREE: Free T4: 0.65 ng/dL — ABNORMAL LOW (ref 0.82–1.77)

## 2018-12-29 LAB — CBC WITH DIFFERENTIAL (CANCER CENTER ONLY)
Abs Immature Granulocytes: 0 10*3/uL (ref 0.00–0.07)
Basophils Absolute: 0 10*3/uL (ref 0.0–0.1)
Basophils Relative: 1 %
Eosinophils Absolute: 0.1 10*3/uL (ref 0.0–0.5)
Eosinophils Relative: 2 %
HCT: 39.2 % (ref 36.0–46.0)
Hemoglobin: 13.6 g/dL (ref 12.0–15.0)
Immature Granulocytes: 0 %
Lymphocytes Relative: 44 %
Lymphs Abs: 1.8 10*3/uL (ref 0.7–4.0)
MCH: 35 pg — ABNORMAL HIGH (ref 26.0–34.0)
MCHC: 34.7 g/dL (ref 30.0–36.0)
MCV: 100.8 fL — ABNORMAL HIGH (ref 80.0–100.0)
Monocytes Absolute: 0.4 10*3/uL (ref 0.1–1.0)
Monocytes Relative: 10 %
Neutro Abs: 1.8 10*3/uL (ref 1.7–7.7)
Neutrophils Relative %: 43 %
Platelet Count: 142 10*3/uL — ABNORMAL LOW (ref 150–400)
RBC: 3.89 MIL/uL (ref 3.87–5.11)
RDW: 11.9 % (ref 11.5–15.5)
WBC Count: 4.1 10*3/uL (ref 4.0–10.5)
nRBC: 0 % (ref 0.0–0.2)

## 2018-12-29 LAB — CMP (CANCER CENTER ONLY)
ALT: 133 U/L — ABNORMAL HIGH (ref 0–44)
AST: 77 U/L — ABNORMAL HIGH (ref 15–41)
Albumin: 3.7 g/dL (ref 3.5–5.0)
Alkaline Phosphatase: 50 U/L (ref 38–126)
Anion gap: 12 (ref 5–15)
BUN: 11 mg/dL (ref 8–23)
CO2: 25 mmol/L (ref 22–32)
Calcium: 8.9 mg/dL (ref 8.9–10.3)
Chloride: 103 mmol/L (ref 98–111)
Creatinine: 0.77 mg/dL (ref 0.44–1.00)
GFR, Est AFR Am: >60 mL/min (ref >60)
GFR, Estimated: >60 mL/min (ref >60)
Glucose, Bld: 130 mg/dL — ABNORMAL HIGH (ref 70–99)
Potassium: 3.5 mmol/L (ref 3.5–5.1)
Sodium: 140 mmol/L (ref 135–145)
Total Bilirubin: 0.6 mg/dL (ref 0.3–1.2)
Total Protein: 7.1 g/dL (ref 6.5–8.1)

## 2018-12-29 LAB — TSH: TSH: 5.884 u[IU]/mL — ABNORMAL HIGH (ref 0.308–3.960)

## 2018-12-29 MED ORDER — SODIUM CHLORIDE 0.9 % IV SOLN
Freq: Once | INTRAVENOUS | Status: AC
  Administered 2018-12-29: 10:00:00 via INTRAVENOUS
  Filled 2018-12-29: qty 250

## 2018-12-29 MED ORDER — SODIUM CHLORIDE 0.9% FLUSH
10.0000 mL | INTRAVENOUS | Status: DC | PRN
  Administered 2018-12-29: 09:00:00 10 mL via INTRAVENOUS
  Filled 2018-12-29: qty 10

## 2018-12-29 MED ORDER — HEPARIN SOD (PORK) LOCK FLUSH 100 UNIT/ML IV SOLN
500.0000 [IU] | Freq: Once | INTRAVENOUS | Status: AC | PRN
  Administered 2018-12-29: 500 [IU]
  Filled 2018-12-29: qty 5

## 2018-12-29 MED ORDER — SODIUM CHLORIDE 0.9 % IV SOLN
200.0000 mg | Freq: Once | INTRAVENOUS | Status: AC
  Administered 2018-12-29: 200 mg via INTRAVENOUS
  Filled 2018-12-29: qty 8

## 2018-12-29 MED ORDER — SODIUM CHLORIDE 0.9% FLUSH
10.0000 mL | INTRAVENOUS | Status: DC | PRN
  Administered 2018-12-29: 12:00:00 10 mL
  Filled 2018-12-29: qty 10

## 2018-12-29 NOTE — Progress Notes (Signed)
OK to treat today with elevated ALT per Dr Burr Medico.

## 2018-12-29 NOTE — Patient Instructions (Signed)

## 2018-12-29 NOTE — Patient Instructions (Addendum)
[facility] Discharge Instructions for Patients Receiving Chemotherapy  Today you received the following immunotherapy agent:  Keytruda  To help prevent nausea and vomiting after your treatment, we encourage you to take your nausea medication as directed.  If you develop nausea and vomiting that is not controlled by your nausea medication, call the clinic.   BELOW ARE SYMPTOMS THAT SHOULD BE REPORTED IMMEDIATELY:  *FEVER GREATER THAN 100.5 F  *CHILLS WITH OR WITHOUT FEVER  NAUSEA AND VOMITING THAT IS NOT CONTROLLED WITH YOUR NAUSEA MEDICATION  *UNUSUAL SHORTNESS OF BREATH  *UNUSUAL BRUISING OR BLEEDING  TENDERNESS IN MOUTH AND THROAT WITH OR WITHOUT PRESENCE OF ULCERS  *URINARY PROBLEMS  *BOWEL PROBLEMS  UNUSUAL RASH Items with * indicate a potential emergency and should be followed up as soon as possible.  Feel free to call the clinic should you have any questions or concerns. The clinic phone number is [phone].  Please show the CHEMO ALERT CARD at check-in to the Emergency Department and triage nurse.   

## 2018-12-31 ENCOUNTER — Telehealth: Payer: Self-pay

## 2018-12-31 NOTE — Telephone Encounter (Signed)
Contacted Dawn [name] in reference to lab results T4 0.65 Per [name] informed Dawn [name]. That Dawn [name] thyroid function was slightly low gave Dawn [name]. The value and told Dawn [name] that [name] will contact [name] to see if she needs replacement. Dawn [name]. Stated she hasn't seen [name] because of the pandemic, and was wondering the situation would be handled informed Dawn [name] that she could probably get a phone visit with [name] to discuss this matter. Dawn [name]. Verbalized understanding. No further problems or concerns noted.

## 2018-12-31 NOTE — Telephone Encounter (Signed)
-----   Message from Truitt Merle, MD sent at 12/31/2018  2:21 PM EDT ----- [name], please let pt know that her thyroid function is slightly low, which could be related to her [facility] treatment. I will contact her PCP [name] to see if she needs replacement.   [name], she is on immunotherapy keytruda for her recurrent endometrial cancer, tolerating well. Could you please see her to see if she needs treatment for her hypothyroidism? Thanks much.   Truitt Merle 12/31/2018

## 2019-01-03 ENCOUNTER — Telehealth: Payer: Self-pay | Admitting: Family Medicine

## 2019-01-03 NOTE — Telephone Encounter (Signed)
Please call pt and have her schedule virtual visit for her thyroid this week. Dr. Grant Ruts sent me her thyroid labs and they are off so I want to discuss options with her.

## 2019-01-04 ENCOUNTER — Ambulatory Visit (INDEPENDENT_AMBULATORY_CARE_PROVIDER_SITE_OTHER): Payer: Medicare Other | Admitting: Family Medicine

## 2019-01-04 ENCOUNTER — Encounter: Payer: Self-pay | Admitting: Family Medicine

## 2019-01-04 VITALS — BP 118/58 | HR 75 | Ht 63.0 in

## 2019-01-04 DIAGNOSIS — Z13220 Encounter for screening for lipoid disorders: Secondary | ICD-10-CM

## 2019-01-04 DIAGNOSIS — M5442 Lumbago with sciatica, left side: Secondary | ICD-10-CM | POA: Diagnosis not present

## 2019-01-04 DIAGNOSIS — G8929 Other chronic pain: Secondary | ICD-10-CM | POA: Diagnosis not present

## 2019-01-04 DIAGNOSIS — I10 Essential (primary) hypertension: Secondary | ICD-10-CM

## 2019-01-04 DIAGNOSIS — M5441 Lumbago with sciatica, right side: Secondary | ICD-10-CM

## 2019-01-04 DIAGNOSIS — E039 Hypothyroidism, unspecified: Secondary | ICD-10-CM | POA: Diagnosis not present

## 2019-01-04 DIAGNOSIS — R7301 Impaired fasting glucose: Secondary | ICD-10-CM | POA: Diagnosis not present

## 2019-01-04 MED ORDER — TIZANIDINE HCL 2 MG PO TABS: 2.0000 mg | ORAL_TABLET | Freq: Every evening | ORAL | 1 refills | Status: DC | PRN

## 2019-01-04 MED ORDER — LEVOTHYROXINE SODIUM 50 MCG PO TABS: 50.0000 ug | ORAL_TABLET | Freq: Every day | ORAL | 1 refills | Status: DC

## 2019-01-04 MED ORDER — OLMESARTAN MEDOXOMIL-HCTZ 20-12.5 MG PO TABS: 1.0000 | ORAL_TABLET | Freq: Every day | ORAL | 2 refills | Status: DC

## 2019-01-04 MED FILL — LEVOTHYROXINE 50 MCG TABLET: 50 | 30 days supply | Qty: 30 | Fill #0

## 2019-01-04 MED FILL — tiZANidine HCL 2 MG TABS: 2 | 30 days supply | Qty: 30 | Fill #0

## 2019-01-04 NOTE — Telephone Encounter (Signed)
Left a message to scheduled a virtual visit.

## 2019-01-04 NOTE — Progress Notes (Signed)
Virtual Visit via Video Note  I connected with Dawn [name] on 01/04/19 at  2:40 PM EDT by a video enabled telemedicine application and verified that I am speaking with the correct person using two identifiers.   I discussed the limitations of evaluation and management by telemedicine and the availability of in person appointments. The patient expressed understanding and agreed to proceed.  Subjective:    CC: F/U abnormal thyroid levels.   HPI: Contacted pt to schedule visit in regards to abnormal thyroid labs. She is currently on Keytruda and for endometrial Ca and on recent labs her TSH was found to be mildly elevated.   She is dong well overall. Has had sme fatigue with her treatment. No major changes in her hair though a littl more dry but she feels that is from washing it more.  No major skin changes.  Has noticed nails are more brittle.  No constipation.   Lab Results  Component Value Date   TSH 5.884 (H) 12/29/2018   Her low back and shoulder  have been bothering her more than usual.  She hasn't been able to get to her masage.  Pain occ radiates down into her buttock, worse on th elft side.  Tries to do her stretches.  Has a narrowing of spinal canal. Normally gets a monthly massage and that really helps. She has also done Pt in the past.   Hypertension- Pt denies chest pain, SOB, dizziness, or heart palpitations.  Taking meds as directed w/o problems.  Denies medication side effects.    Impaired fasting glucose-no increased thirst or urination. No symptoms consistent with hypoglycemia.    Past medical history, Surgical history, Family history not pertinant except as noted below, Social history, Allergies, and medications have been entered into the medical record, reviewed, and corrections made.   Review of Systems: No fevers, chills, night sweats, weight loss, chest pain, or shortness of breath.   Objective:    General: Speaking clearly in complete sentences without any  shortness of breath.  Alert and oriented x3.  Normal judgment. No apparent acute distress.    Impression and Recommendations:    HTN - Well controlled. Continue current regimen. Follow up in  6 months. Due for CMP and lipids.   IFG - Due for A1C  Hypothyroid  - mild sxs. Discussed possible tx with low dose levothyroixine and recheck labs in 6 weeks with TSH and free T4. Likely side effect of the [place] which she will likely be on for the next year.   Low Back pain with radiculopathy - CT of abdomen form 10/2018 showed no lytic or sclerotic lesion. Sounds like a disc issue. Since unable to go to gym and do her massage treatments will try a muscle relaxer. She has been trying to do stretches at home.       I discussed the assessment and treatment plan with the patient. The patient was provided an opportunity to ask questions and all were answered. The patient agreed with the plan and demonstrated an understanding of the instructions.   The patient was advised to call back or seek an in-person evaluation if the symptoms worsen or if the condition fails to improve as anticipated.   Beatrice Lecher, MD

## 2019-01-18 NOTE — Progress Notes (Signed)
[place]   [phone] [phone]   Clinic Follow up Note   Patient Care Team: [name], MD as PCP - Haskell Riling, MD as Consulting Physician (General Surgery) Truitt Merle, MD as Consulting Physician (Hematology) [name], MD as Attending Physician (Radiation Oncology)  Date of Service:  01/20/2019  CHIEF COMPLAINT: F/u on breast and endometrial cancers  SUMMARY OF ONCOLOGIC HISTORY: Oncology History   MSI high Cancer Staging Endometrial cancer [facility]) Staging form: Corpus Uteri - Adenosarcoma, AJCC 8th Edition - Pathologic stage from 06/09/2017: Stage IIIC (pT1a, pN1, cM0) - Signed by Truitt Merle, MD on 06/16/2017  Malignant neoplasm of upper-inner quadrant of right breast in female, estrogen receptor positive ([place]) Staging form: Breast, AJCC 8th Edition - Clinical stage from 05/06/2017: Stage IA (cT1c, cN0, cM0, G2, ER: Positive, PR: Negative, HER2: Positive) - Unsigned - Pathologic stage from 05/25/2017: Stage IIA (pT2, pN0, cM0, G2, ER: Positive, PR: Negative, HER2: Negative) - Signed by Truitt Merle, MD on 06/16/2017       Malignant neoplasm of upper-inner quadrant of right breast in female, estrogen receptor positive ([place])   04/27/2017 Initial Biopsy    Diagnosis Breast, right, needle core biopsy INVASIVE DUCTAL CARCINOMA, GRADE 2 Microscopic Comment The neoplasm has intracellular mucin and signet ring cell features. Immunostains shows these cells are positive for ER,GATA3, ck7 and GCDFP, negative for ck20, cdx2, TTF-1 and pax8, The immunostaining pattern supports the neoplasm is breast primary.    04/27/2017 Receptors her2    Estrogen Receptor: 70%, POSITIVE, MODERATE STAINING INTENSITY Progesterone Receptor: 0%, NEGATIVE Proliferation Marker Ki67: 30% HER2 - **POSITIVE** RATIO OF HER2/CEP17 SIGNALS 2.91 AVERAGE HER2 COPY NUMBER PER CELL 7.28    04/27/2017 Initial Diagnosis    Malignant neoplasm of upper-inner  quadrant of right breast in female, estrogen receptor positive ([place])    05/07/2017 Imaging    CT Chest W Contrast 05/07/17 IMPRESSION: Tiny well-defined fatty lesion on the pleura at the right lung base. The thinner slice collimation used for today's chest CT eliminates volume-averaging seen in the lesion on the prior exam and confirms that this is a diffusely fatty nodule. This is a benign finding and likely represents a tiny lipoma. No defect in the hemidiaphragm evident to suggest tiny diaphragmatic hernia. Pulmonary hamartoma a consideration although the lack of soft tissue components makes this less likely.    05/15/2017 Genetic Testing    Patient had genetic testing due to a personal history of breast cancer and uterine cancer as well as a family history of cancer. The Multi-Cancer panel was ordered. The Multi-Cancer Panel offered by [facility] includes sequencing and/or deletion duplication testing of the following 83 genes: ALK, APC, ATM, AXIN2,BAP1,  BARD1, BLM, BMPR1A, BRCA1, BRCA2, BRIP1, CASR, CDC73, CDH1, CDK4, CDKN1B, CDKN1C, CDKN2A (p14ARF), CDKN2A (p16INK4a), CEBPA, CHEK2, CTNNA1, DICER1, DIS3L2, EGFR (c.2369C>T, p.Thr790Met variant only), EPCAM (Deletion/duplication testing only), FH, FLCN, GATA2, GPC3, GREM1 (Promoter region deletion/duplication testing only), HOXB13 (c.251G>A, p.Gly84Glu), HRAS, KIT, MAX, MEN1, MET, MITF (c.952G>A, p.Glu318Lys variant only), MLH1, MSH2, MSH3, MSH6, MUTYH, NBN, NF1, NF2, NTHL1, PALB2, PDGFRA, PHOX2B, PMS2, POLD1, POLE, POT1, PRKAR1A, PTCH1, PTEN, RAD50, RAD51C, RAD51D, RB1, RECQL4, RET, RUNX1, SDHAF2, SDHA (sequence changes only), SDHB, SDHC, SDHD, SMAD4, SMARCA4, SMARCB1, SMARCE1, STK11, SUFU, TERC, TERT, TMEM127, TP53, TSC1, TSC2, VHL, WRN and WT1.   Results: No pathogenic mutations were identified. A VUS in the GATA2 gene c.1348G>A (p.Gly450Arg) was identified.  The date of this test report is 05/25/2017.  05/25/2017 Surgery    RIGHT BREAST  LUMPECTOMY WITH RADIOACTIVE SEED AND  RIGHT AXILLARY SENTINEL LYMPH NODE BIOPSY and Pot placement by Dr. Excell Seltzer 05/25/17    05/25/2017 Pathology Results    Diagnosis 05/25/17 1. Breast, lumpectomy, right - INVASIVE DUCTAL CARCINOMA, GRADE II/III, SPANNING 2.2 CM. - DUCTAL CARCINOMA IN SITU, HIGH GRADE. - THE SURGICAL RESECTION MARGINS ARE NEGATIVE FOR CARCINOMA. - SEE ONCOLOGY TABLE BELOW. 2. Breast, excision, right additional superior margin - BENIGN FIBROADIPOSE TISSUE. - BENIGN SKELETAL MUSCLE. - SEE COMMENT. 3. Breast, excision, right additional lateral margin - BENIGN BREAST PARENCHYMA. - THERE IS NO EVIDENCE OF MALIGNANCY. - SEE COMMENT. 4. Breast, excision, chest wall margin - BENIGN FIBROADIPOSE TISSUE. - THERE IS NO EVIDENCE OF MALIGNANCY. - SEE COMMENT. 5. Lymph node, sentinel, biopsy, right axillary - THERE IS NO EVIDENCE OF CARCINOMA IN 1 OF 1 LYMPH NODE (0/1). 6. Lymph node, sentinel, biopsy, right axillary - THERE IS NO EVIDENCE OF CARCINOMA IN 1 OF 1 LYMPH NODE (0/1). 7. Lymph node, sentinel, biopsy, right axillary - THERE IS NO EVIDENCE OF CARCINOMA IN 1 OF 1 LYMPH NODE (0/1).    06/26/2017 PET scan    PET  IMPRESSION: 1. Postoperative findings both in the right breast and in the anatomic pelvis, with associated low-grade activity considered to be postoperative in nature. No hypermetabolic adenopathy or hypermetabolic lesions are identified to suggest active metastatic disease/malignancy. 2. Other imaging findings of potential clinical significance: Aortic Atherosclerosis (ICD10-I70.0). Sigmoid colon diverticulosis. Biapical pleuroparenchymal scarring in the lungs. Small amount of free pelvic fluid in the cul-de-sac, likely postoperative.     07/01/2017 - 10/14/2017 Chemotherapy    Adjuvant TCH with Onpro every 3 weeks for 6 cycles starting on 07/01/17, Changed Taxol to abraxane with cycle 4 due to drug rash reaction, followed by Herceptin every 3 weeks for  6 months     11/01/2017 - 12/08/2017 Radiation Therapy    Radiation therapy to her right breast with [name]    11/04/2017 - 06/2018 Chemotherapy    Maintenance Herceptin starting 11/04/17 and will comeplete her 12 months in 06/2018     12/23/2017 -  Anti-estrogen oral therapy    Adjuvant letrozole 2.5 mg daily started 12/23/17, switched to exemestane on 04/21/18 due to joint pain. Could not afford aromasin, started anastrozole 04/2018.     12/30/2017 Imaging    CT CAP W Contrast 12/30/17 IMPRESSION: Increased size of 1.1 cm aorto-caval retroperitoneal lymph node. This could be reactive due to interval hysterectomy, however metastatic carcinoma cannot be excluded. Consider continued attention on short-term follow-up CT, or PET-CT scan for further evaluation.  Mild hepatic steatosis.    01/25/2018 PET scan    IMPRESSION: 1. Enlarging hypermetabolic aortocaval lymph node is worrisome for metastatic disease. 2. Probable postoperative seroma in the medial right breast, with associated mild inflammatory hypermetabolism.    02/17/2018 -  Antibody Plan    Keytruda every 3 weeks starting 02/17/18. Plan for 2 years     04/28/2018 Mammogram    Probably benign. Calcifications in the right breast most likely are dystrophic, related to previous surgery, and are probably benign.     04/28/2018 Imaging    DEXA Scan T Score -2.0    06/10/2018 Echocardiogram    06/10/2018 ECHO LV EF: 55% -   60%    08/28/2018 - 08/2018 Chemotherapy    Neratinib '4mg'$  for 1 week then titrate up with 1 additional tablet weekly up to 12 mg starting 08/28/18. Stopped soon after  starting due to diarrhea.    10/25/2018 Imaging    CT CAP 10/25/18  IMPRESSION: 1. No evidence of metastatic disease. 2.  Aortic atherosclerosis (ICD10-170.0).     Endometrial adenocarcinoma ([place])   05/07/2017 Initial Diagnosis    Endometrial adenocarcinoma ([place])    06/09/2017 Surgery    XI ROBOTIC ASSISTED TOTAL LAPOROSCOPIC HYSTERECTOMY WITH  BILATERAL SALPINGO OOPHORECTOMY and SENTINEL NODE BIOPSY by Dr. Denman George 06/09/17    06/09/2017 Pathology Results    Diagnosis 06/09/17 1. Lymph node, sentinel, biopsy, right external iliac - METASTATIC ADENOCARCINOMA IN ONE LYMPH NODE (1/1). 2. Lymph node, sentinel, biopsy, left obturator - ONE BENIGN LYMPH NODE (0/1). 3. Lymph node, sentinel, biopsy, left external iliac - METASTATIC ADENOCARCINOMA IN ONE LYMPH NODE (1/1). 4. Uterus +/- tubes/ovaries, neoplastic ENDOMETRIUM: - ENDOMETRIAL ADENOCARCINOMA, 3.2 CM. - CARCINOMA INVADES INNER HALF OF MYOMETRIUM. 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 - CERVIX, BILATERAL FALLOPIAN TUBES AND BILATERAL OVARIES FREE OF TUMOR    08/18/2017 - 09/17/2017 Radiation Therapy    vaginal brachytherapy per 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    02/17/2018 -  Chemotherapy    Keytruda every 3 weeks starting 02/17/18      CURRENT THERAPY:  -Keytruda every 3 weeksstarting 02/17/18.  -Letrozole 2.'5mg'$  daily starting 12/23/17. Switched to Anastrozole in 04/2018 due to joint pain  INTERVAL HISTORY:  Dawn [name] is here for a follow up and ongoing treatment. She presents to the clinic today by herself. She notes her left shoulder is painful, especially when she reaches. She has had this for some time and has limited her ROM slightly. She plans to see orthopedist. She notes she saw a endocrinologist and has been put on levothyroxine. She has been on this for 2 weeks.  She notes she continues to tolerate Keytruda. She notes shill have small bumps of skin from this that resolve on its own.    REVIEW OF SYSTEMS:   Constitutional: Denies fevers, chills or abnormal weight loss Eyes: Denies blurriness of vision Ears, nose, mouth, throat, and face: Denies mucositis or sore throat Respiratory: Denies cough, dyspnea or  wheezes Cardiovascular: Denies palpitation, chest discomfort or lower extremity swelling Gastrointestinal:  Denies nausea, heartburn or change in bowel habits Skin: (+) very mild skin rash on upper extremities MSK: (+) Left shoulder pain with slight limited ROM  Lymphatics: Denies new lymphadenopathy or easy bruising Neurological:Denies numbness, tingling or new weaknesses Behavioral/Psych: Mood is stable, no new changes  All other systems were reviewed with the patient and are negative.  MEDICAL HISTORY:  Past Medical History:  Diagnosis Date   Anemia    as a child.   Arthritis    Bilateral cataracts    Bilateral leg cramps    Cancer ([place])    skin   Dyspnea    Family history of bladder cancer    Family history of colon cancer in father    Vira Agar' corneal dystrophy    GERD (gastroesophageal reflux disease)    Hearing loss    Right side 30%   History of hiatal hernia    small size   History of radiation therapy 08/21/17, 08/28/17,09/01/17, 09/11/17, 09/17/17   Vaginal cuff brachytherapy.    History of radiation therapy 11/11/17- 12/08/17   Right Breast treated to 40.05 Gy with 15 fx of 2.67 Gy and a boost of 10 Gy  with 5 fx.    Hyperlipidemia    Hypertension    IBS (irritable bowel syndrome)    hx of   Malignant neoplasm of upper-inner quadrant of right female breast ([place]) 04/2017   NAFL (nonalcoholic fatty liver)    Obesity    PONV (postoperative nausea and vomiting)    Tuberculosis    tested positive, mother had when patient was child   Uterine cancer ([place]) 04/2017   endometrial cancer   Varices, gastric    Vertigo     SURGICAL HISTORY: Past Surgical History:  Procedure Laterality Date   BREAST BIOPSY Right 04/27/2017   BREAST LUMPECTOMY WITH RADIOACTIVE SEED AND SENTINEL LYMPH NODE BIOPSY Right 05/25/2017   Procedure: RIGHT BREAST LUMPECTOMY WITH RADIOACTIVE SEED AND  RIGHT AXILLARY SENTINEL LYMPH NODE BIOPSY;  Surgeon: Excell Seltzer,  MD;  Location: [place];  Service: General;  Laterality: Right;   CATARACT EXTRACTION, BILATERAL     COLONOSCOPY     HYSTEROSCOPY W/D&C N/A 04/29/2017   Procedure: DILATATION AND CURETTAGE /HYSTEROSCOPY;  Surgeon: Linda Hedges, DO;  Location: [facility];  Service: Gynecology;  Laterality: N/A;   PORTACATH PLACEMENT Left 05/25/2017   Procedure: INSERTION PORT-A-CATH;  Surgeon: Excell Seltzer, MD;  Location: [place];  Service: General;  Laterality: Left;   ROBOTIC ASSISTED TOTAL HYSTERECTOMY WITH BILATERAL SALPINGO OOPHERECTOMY N/A 06/09/2017   Procedure: XI ROBOTIC ASSISTED TOTAL LAPOROSCOPIC HYSTERECTOMY WITH BILATERAL SALPINGO OOPHORECTOMY;  Surgeon: Everitt Amber, MD;  Location: [facility];  Service: Gynecology;  Laterality: N/A;   SENTINEL NODE BIOPSY N/A 06/09/2017   Procedure: SENTINEL NODE BIOPSY;  Surgeon: Everitt Amber, MD;  Location: [facility];  Service: Gynecology;  Laterality: N/A;    I have reviewed the social history and family history with the patient and they are unchanged from previous note.  ALLERGIES:  is allergic to ciprofloxacin; crestor [rosuvastatin calcium]; fosamax [alendronate sodium]; penicillins; and taxol [paclitaxel].  MEDICATIONS:  Current Outpatient Medications  Medication Sig Dispense Refill   acetaminophen (TYLENOL) 500 MG tablet Take 500 mg by mouth every 6 (six) hours as needed for moderate pain.     anastrozole (ARIMIDEX) 1 MG tablet Take 1 tablet (1 mg total) by mouth daily. 90 tablet 3   Ascorbic Acid (VITAMIN C) 1000 MG tablet Take 1,000 mg by mouth daily.     b complex vitamins capsule Take 1 capsule by mouth daily.     Calcium Carbonate-Vitamin D3 (CALCIUM 600-D) 600-400 MG-UNIT TABS Take 1-2 tablets by mouth daily.     diphenhydrAMINE (BENADRYL) 25 MG tablet Take 25-50 mg by mouth every 6 (six) hours as needed for sleep.     ibandronate (BONIVA) 150 MG tablet Take in the morning, once a month,  with a full glass of  water,on an empty stomach,do not take anything else by mouth or lie down for the next 30 min. 3 tablet 3   ibuprofen (ADVIL,MOTRIN) 200 MG tablet Take 200 mg by mouth every 6 (six) hours as needed.     levothyroxine (SYNTHROID) 50 MCG tablet Take 1 tablet (50 mcg total) by mouth daily. 30 tablet 1   lidocaine-prilocaine (EMLA) cream   2   Multiple Vitamin (MULTIVITAMIN) capsule Take 1 capsule by mouth daily.       olmesartan-hydrochlorothiazide (BENICAR HCT) 20-12.5 MG tablet Take 1 tablet by mouth daily. 90 tablet 2   Pitavastatin Calcium (LIVALO) 2 MG TABS Take 1.5 tablets (3 mg total) by mouth every evening. Takes 1.5 tablet 135 tablet 3  prednisoLONE acetate (PRED FORTE) 1 % ophthalmic suspension Place 1 drop into the left eye 3 (three) times daily.     sodium chloride (MURO 128) 5 % ophthalmic solution Place 2 drops into both eyes daily.     tiZANidine (ZANAFLEX) 2 MG tablet Take 1 tablet (2 mg total) by mouth at bedtime as needed for muscle spasms. 30 tablet 1   triamcinolone cream (KENALOG) 0.1 % APPLY TO ITCHY SPOTS ONCE DAILY  1   No current facility-administered medications for this visit.    Facility-Administered Medications Ordered in Other Visits  Medication Dose Route Frequency Provider Last Rate Last Dose   sodium chloride flush (NS) 0.9 % injection 10 mL  10 mL Intracatheter PRN Truitt Merle, MD   10 mL at 08/25/18 1111    PHYSICAL EXAMINATION: ECOG PERFORMANCE STATUS: 0 - Asymptomatic  Vitals:   01/20/19 0931  BP: (!) 147/84  Pulse: 75  Resp: 18  Temp: 97.9 F (36.6 C)  SpO2: 99%   Filed Weights   01/20/19 0931  Weight: 192 lb 9.6 oz (87.4 kg)    GENERAL:alert, no distress and comfortable SKIN: skin color, texture, turgor are normal, no significant lesions (+) a few papular skin rash on upper extremities. EYES: normal, Conjunctiva are pink and non-injected, sclera clear OROPHARYNX:no exudate, no erythema and lips, buccal mucosa, and tongue normal    NECK: supple, thyroid normal size, non-tender, without nodularity LYMPH:  no palpable lymphadenopathy in the cervical, axillary or inguinal LUNGS: clear to auscultation and percussion with normal breathing effort HEART: regular rate & rhythm and no murmurs and no lower extremity edema ABDOMEN:abdomen soft, non-tender and normal bowel sounds Musculoskeletal:no cyanosis of digits and no clubbing  NEURO: alert & oriented x 3 with fluent speech, no focal motor/sensory deficits BREAST: S/p right breast lumpectomy: Surgical incision healed well, mild right breast hardness. No palpable mass or adenopathy   LABORATORY DATA:  I have reviewed the data as listed CBC Latest Ref Rng & Units 01/20/2019 12/29/2018 11/17/2018  WBC 4.0 - 10.5 K/uL 4.0 4.1 3.7(L)  Hemoglobin 12.0 - 15.0 g/dL 13.4 13.6 13.3  Hematocrit 36.0 - 46.0 % 38.8 39.2 38.2  Platelets 150 - 400 K/uL 155 142(L) 144(L)     CMP Latest Ref Rng & Units 01/20/2019 12/29/2018 11/17/2018  Glucose 70 - 99 mg/dL 116(H) 130(H) 191(H)  BUN 8 - 23 mg/dL '13 11 10  '$ Creatinine 0.44 - 1.00 mg/dL 0.75 0.77 0.83  Sodium 135 - 145 mmol/L 136 140 135  Potassium 3.5 - 5.1 mmol/L 3.7 3.5 3.6  Chloride 98 - 111 mmol/L 102 103 99  CO2 22 - 32 mmol/L '26 25 27  '$ Calcium 8.9 - 10.3 mg/dL 9.0 8.9 8.8(L)  Total Protein 6.5 - 8.1 g/dL 7.2 7.1 7.0  Total Bilirubin 0.3 - 1.2 mg/dL 0.6 0.6 0.7  Alkaline Phos 38 - 126 U/L 50 50 50  AST 15 - 41 U/L 52(H) 77(H) 70(H)  ALT 0 - 44 U/L 81(H) 133(H) 107(H)      RADIOGRAPHIC STUDIES: I have personally reviewed the radiological images as listed and agreed with the findings in the report. No results found.   ASSESSMENT & PLAN:  Dawn [name] is a 68 y.o. female with   1. 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 Treated with right breast lumpectomy, adjuvant chemo and radiation. She tried Nerlynx but could not tolerate due  to diarrhea.  -She  started anti-estrogen therapy with letrozole. Due to joint pain I switched her to Anastrozole in 04/2018. Tolerating well.  -She tried neratinib, had a severe diarrhea, and does not want try again. -She is clinically doing well, exam was unremarkable, mammogram from 10/2018 was unremarkable.  No clinical evidence of recurrence -Continue anastrozole  -F/u in 6 weeks   2. Endometrial adenocarcinomawith lymph node metastasis, pT1apN1M0, FIGO stage IIIc, MSI-H, probable RP node recurrence in 12/2017 -Diagnosed in 04/2017. Treated with hysterectomy, BSO, adjuvant radiation, and chemo with Carbotaxol. Currently on Keytruda every 3 weeksfor presumed retroperitoneal lymph node metastasis. Tolerating well. -Latest CT Cap from 10/25/18 which shows No evidence ofmetastaticdisease.I reviewed her scan images myself. She is responding well to [facility]. She will continue to complete 2 years treatment.If noevidence of progression, plan to complete in 02/2020. -She is currently on antibody Keytruda every 3 weeks since 02/17/18. Tolerating well.  -She is clinically doing very well. Labs reviewed, CBC WNL overall, CMP WNL except mildly elevated ALT and AST. Overall adequate to proceed with Keytruda today and every 3 weeks.  -Next CT scan in 04/2019.  -F/u in 6 weeks    3. HTN -Controlled, Continue to f/u with PCP  -BP at 147/84 (01/20/19)  4. Transaminitis -Secondary to fatty liver disease -Stable   5.Osteopenia -DEXA scan in 05/2018 revealed T score of -2.0. -She is on monthly boniva, prescribed by her PCP, since 02/2018.Continue -ContinueVitamin D and Calcium supplements. -will check her Vitamin D level every 6 months.   6. Metobolic syndrome, hyperglycemia  -She has no history of diabetes. Her random blood glucose was 213, we discussed diabetic diet and exercise -She was recently found to have abnormal TSH levels. She has been seen by endocrinologist and has started  levothroxine on 01/04/19. Will monitor TSH level every 3 weeks.  -Her weight does continue to trend up. I encouraged her to remain acitve.  -Her PCP requested HgA1c, cholesterol and lipid panel. Will do labs today  -Continue to follow-up with her primary care physician and Endocrinologist.   7. Acute Left shoulder pain -Secondary to exercise  -I recommend she not do weight lifting with that shoulder for now  -She can continue heating pad and OTC pain medication. I advised her not to take too much Tylenol.   -Her left shoulder pain has not resolved, she plans to be seen by an orthopedist.    Plan -Labs reviewed and adequate to proceed with Keytruda today -Continue Anastrozole, refilled today  -Lab, flush and Keytruda in 3, 6 and 9 weeks  -F/u in 6 weeks    No problem-specific Assessment & Plan notes found for this encounter.   Orders Placed This Encounter  Procedures   Hemoglobin A1c    Standing Status:   Future    Standing Expiration Date:   01/20/2020   Lipid panel    Standing Status:   Future    Standing Expiration Date:   01/20/2020   All questions were answered. The patient knows to call the clinic with any problems, questions or concerns. No barriers to learning was detected. I spent 20 minutes counseling the patient face to face. The total time spent in the appointment was 25 minutes and more than 50% was on counseling and review of test results     Dawn [name] 01/20/2019   I, Dawn [name], am acting as scribe for Truitt Merle, MD.   I have reviewed the above documentation for accuracy and completeness, and I agree with the above.

## 2019-01-20 ENCOUNTER — Inpatient Hospital Stay: Payer: Medicare Other | Attending: Hematology | Admitting: Hematology

## 2019-01-20 ENCOUNTER — Inpatient Hospital Stay: Payer: Medicare Other

## 2019-01-20 ENCOUNTER — Other Ambulatory Visit: Payer: Self-pay

## 2019-01-20 ENCOUNTER — Telehealth: Payer: Self-pay | Admitting: Hematology

## 2019-01-20 VITALS — BP 147/84 | HR 75 | Temp 97.9°F | Resp 18 | Ht 63.0 in | Wt 192.6 lb

## 2019-01-20 DIAGNOSIS — Z170 Estrogen receptor positive status [ER+]: Secondary | ICD-10-CM | POA: Insufficient documentation

## 2019-01-20 DIAGNOSIS — M8580 Other specified disorders of bone density and structure, unspecified site: Secondary | ICD-10-CM | POA: Insufficient documentation

## 2019-01-20 DIAGNOSIS — C541 Malignant neoplasm of endometrium: Secondary | ICD-10-CM

## 2019-01-20 DIAGNOSIS — Z95828 Presence of other vascular implants and grafts: Secondary | ICD-10-CM

## 2019-01-20 DIAGNOSIS — R7301 Impaired fasting glucose: Secondary | ICD-10-CM

## 2019-01-20 DIAGNOSIS — Z79899 Other long term (current) drug therapy: Secondary | ICD-10-CM | POA: Diagnosis not present

## 2019-01-20 DIAGNOSIS — E039 Hypothyroidism, unspecified: Secondary | ICD-10-CM

## 2019-01-20 DIAGNOSIS — E785 Hyperlipidemia, unspecified: Secondary | ICD-10-CM

## 2019-01-20 DIAGNOSIS — C50211 Malignant neoplasm of upper-inner quadrant of right female breast: Secondary | ICD-10-CM | POA: Insufficient documentation

## 2019-01-20 DIAGNOSIS — Z79811 Long term (current) use of aromatase inhibitors: Secondary | ICD-10-CM | POA: Insufficient documentation

## 2019-01-20 DIAGNOSIS — Z5112 Encounter for antineoplastic immunotherapy: Secondary | ICD-10-CM | POA: Diagnosis not present

## 2019-01-20 DIAGNOSIS — E669 Obesity, unspecified: Secondary | ICD-10-CM | POA: Insufficient documentation

## 2019-01-20 DIAGNOSIS — Z131 Encounter for screening for diabetes mellitus: Secondary | ICD-10-CM

## 2019-01-20 DIAGNOSIS — C779 Secondary and unspecified malignant neoplasm of lymph node, unspecified: Secondary | ICD-10-CM | POA: Insufficient documentation

## 2019-01-20 DIAGNOSIS — E8881 Metabolic syndrome: Secondary | ICD-10-CM

## 2019-01-20 DIAGNOSIS — M810 Age-related osteoporosis without current pathological fracture: Secondary | ICD-10-CM

## 2019-01-20 DIAGNOSIS — I10 Essential (primary) hypertension: Secondary | ICD-10-CM | POA: Diagnosis not present

## 2019-01-20 LAB — CMP (CANCER CENTER ONLY)
ALT: 81 U/L — ABNORMAL HIGH (ref 0–44)
AST: 52 U/L — ABNORMAL HIGH (ref 15–41)
Albumin: 3.8 g/dL (ref 3.5–5.0)
Alkaline Phosphatase: 50 U/L (ref 38–126)
Anion gap: 8 (ref 5–15)
BUN: 13 mg/dL (ref 8–23)
CO2: 26 mmol/L (ref 22–32)
Calcium: 9 mg/dL (ref 8.9–10.3)
Chloride: 102 mmol/L (ref 98–111)
Creatinine: 0.75 mg/dL (ref 0.44–1.00)
GFR, Est AFR Am: >60 mL/min (ref >60)
GFR, Estimated: >60 mL/min (ref >60)
Glucose, Bld: 116 mg/dL — ABNORMAL HIGH (ref 70–99)
Potassium: 3.7 mmol/L (ref 3.5–5.1)
Sodium: 136 mmol/L (ref 135–145)
Total Bilirubin: 0.6 mg/dL (ref 0.3–1.2)
Total Protein: 7.2 g/dL (ref 6.5–8.1)

## 2019-01-20 LAB — CBC WITH DIFFERENTIAL (CANCER CENTER ONLY)
Abs Immature Granulocytes: 0.01 10*3/uL (ref 0.00–0.07)
Basophils Absolute: 0 10*3/uL (ref 0.0–0.1)
Basophils Relative: 0 %
Eosinophils Absolute: 0.1 10*3/uL (ref 0.0–0.5)
Eosinophils Relative: 2 %
HCT: 38.8 % (ref 36.0–46.0)
Hemoglobin: 13.4 g/dL (ref 12.0–15.0)
Immature Granulocytes: 0 %
Lymphocytes Relative: 44 %
Lymphs Abs: 1.7 10*3/uL (ref 0.7–4.0)
MCH: 35 pg — ABNORMAL HIGH (ref 26.0–34.0)
MCHC: 34.5 g/dL (ref 30.0–36.0)
MCV: 101.3 fL — ABNORMAL HIGH (ref 80.0–100.0)
Monocytes Absolute: 0.4 10*3/uL (ref 0.1–1.0)
Monocytes Relative: 11 %
Neutro Abs: 1.7 10*3/uL (ref 1.7–7.7)
Neutrophils Relative %: 43 %
Platelet Count: 155 10*3/uL (ref 150–400)
RBC: 3.83 MIL/uL — ABNORMAL LOW (ref 3.87–5.11)
RDW: 11.9 % (ref 11.5–15.5)
WBC Count: 4 10*3/uL (ref 4.0–10.5)
nRBC: 0 % (ref 0.0–0.2)

## 2019-01-20 LAB — TSH: TSH: 2.523 u[IU]/mL (ref 0.308–3.960)

## 2019-01-20 MED ORDER — SODIUM CHLORIDE 0.9 % IV SOLN
200.0000 mg | Freq: Once | INTRAVENOUS | Status: AC
  Administered 2019-01-20: 200 mg via INTRAVENOUS
  Filled 2019-01-20: qty 8

## 2019-01-20 MED ORDER — ANASTROZOLE 1 MG PO TABS: 1.0000 mg | ORAL_TABLET | Freq: Every day | ORAL | 3 refills | Status: DC

## 2019-01-20 MED ORDER — SODIUM CHLORIDE 0.9% FLUSH
10.0000 mL | INTRAVENOUS | Status: DC | PRN
  Administered 2019-01-20: 10 mL
  Filled 2019-01-20: qty 10

## 2019-01-20 MED ORDER — SODIUM CHLORIDE 0.9% FLUSH
10.0000 mL | Freq: Once | INTRAVENOUS | Status: AC
  Administered 2019-01-20: 10 mL
  Filled 2019-01-20: qty 10

## 2019-01-20 MED ORDER — HEPARIN SOD (PORK) LOCK FLUSH 100 UNIT/ML IV SOLN
500.0000 [IU] | Freq: Once | INTRAVENOUS | Status: AC | PRN
  Administered 2019-01-20: 12:00:00 500 [IU]
  Filled 2019-01-20: qty 5

## 2019-01-20 MED ORDER — SODIUM CHLORIDE 0.9 % IV SOLN
Freq: Once | INTRAVENOUS | Status: AC
  Administered 2019-01-20: 11:00:00 via INTRAVENOUS
  Filled 2019-01-20: qty 250

## 2019-01-20 NOTE — Progress Notes (Signed)
Per Malachy Mood desk RN for Dr Burr Medico, per Dr Burr Medico ok to tx with today's labs. ALT 81.

## 2019-01-20 NOTE — Patient Instructions (Signed)
[facility] Discharge Instructions for Patients Receiving Chemotherapy  Today you received the following immunotherapy agent:  Keytruda  To help prevent nausea and vomiting after your treatment, we encourage you to take your nausea medication as directed.  If you develop nausea and vomiting that is not controlled by your nausea medication, call the clinic.   BELOW ARE SYMPTOMS THAT SHOULD BE REPORTED IMMEDIATELY:  *FEVER GREATER THAN 100.5 F  *CHILLS WITH OR WITHOUT FEVER  NAUSEA AND VOMITING THAT IS NOT CONTROLLED WITH YOUR NAUSEA MEDICATION  *UNUSUAL SHORTNESS OF BREATH  *UNUSUAL BRUISING OR BLEEDING  TENDERNESS IN MOUTH AND THROAT WITH OR WITHOUT PRESENCE OF ULCERS  *URINARY PROBLEMS  *BOWEL PROBLEMS  UNUSUAL RASH Items with * indicate a potential emergency and should be followed up as soon as possible.  Feel free to call the clinic should you have any questions or concerns. The clinic phone number is [phone].  Please show the CHEMO ALERT CARD at check-in to the Emergency Department and triage nurse.   

## 2019-01-20 NOTE — Progress Notes (Signed)
Per [name] okay to treat with ALT 81 from today's lab results, spoke with Kerrin Champagne RN Infusion

## 2019-01-20 NOTE — Telephone Encounter (Signed)
Scheduled appt per 5/7 los. °

## 2019-01-21 ENCOUNTER — Other Ambulatory Visit: Payer: Self-pay | Admitting: Emergency Medicine

## 2019-01-21 DIAGNOSIS — C50211 Malignant neoplasm of upper-inner quadrant of right female breast: Secondary | ICD-10-CM

## 2019-01-21 DIAGNOSIS — Z170 Estrogen receptor positive status [ER+]: Secondary | ICD-10-CM

## 2019-01-22 ENCOUNTER — Encounter: Payer: Self-pay | Admitting: Hematology

## 2019-01-24 ENCOUNTER — Other Ambulatory Visit: Payer: Self-pay

## 2019-01-24 ENCOUNTER — Telehealth: Payer: Self-pay

## 2019-01-24 DIAGNOSIS — E785 Hyperlipidemia, unspecified: Secondary | ICD-10-CM

## 2019-01-24 DIAGNOSIS — E8881 Metabolic syndrome: Secondary | ICD-10-CM

## 2019-01-24 DIAGNOSIS — Z131 Encounter for screening for diabetes mellitus: Secondary | ICD-10-CM

## 2019-01-24 DIAGNOSIS — E039 Hypothyroidism, unspecified: Secondary | ICD-10-CM

## 2019-01-24 NOTE — Telephone Encounter (Signed)
Left voice message for this patient per [name] TSH is normal, explained lipid and A1C will be ordered on 5/27 (am not sure why they did not run them).

## 2019-01-24 NOTE — Telephone Encounter (Signed)
-----   Message from Truitt Merle, MD sent at 01/22/2019  9:36 PM EDT ----- Please let her know her TSH is normal now, thanks   Truitt Merle  01/22/2019

## 2019-01-28 MED FILL — LEVOTHYROXINE 50 MCG TABLET: 50 | 30 days supply | Qty: 30 | Fill #1

## 2019-02-09 ENCOUNTER — Inpatient Hospital Stay: Payer: Medicare Other

## 2019-02-09 ENCOUNTER — Other Ambulatory Visit: Payer: Self-pay

## 2019-02-09 VITALS — BP 130/78 | HR 71 | Temp 97.8°F | Resp 18 | Wt 195.2 lb

## 2019-02-09 DIAGNOSIS — E039 Hypothyroidism, unspecified: Secondary | ICD-10-CM

## 2019-02-09 DIAGNOSIS — E089 Diabetes mellitus due to underlying condition without complications: Secondary | ICD-10-CM

## 2019-02-09 DIAGNOSIS — C50211 Malignant neoplasm of upper-inner quadrant of right female breast: Secondary | ICD-10-CM

## 2019-02-09 DIAGNOSIS — C541 Malignant neoplasm of endometrium: Secondary | ICD-10-CM | POA: Diagnosis not present

## 2019-02-09 DIAGNOSIS — Z170 Estrogen receptor positive status [ER+]: Secondary | ICD-10-CM

## 2019-02-09 DIAGNOSIS — E785 Hyperlipidemia, unspecified: Secondary | ICD-10-CM

## 2019-02-09 DIAGNOSIS — Z95828 Presence of other vascular implants and grafts: Secondary | ICD-10-CM

## 2019-02-09 DIAGNOSIS — C779 Secondary and unspecified malignant neoplasm of lymph node, unspecified: Secondary | ICD-10-CM | POA: Diagnosis not present

## 2019-02-09 DIAGNOSIS — E669 Obesity, unspecified: Secondary | ICD-10-CM | POA: Diagnosis not present

## 2019-02-09 DIAGNOSIS — E8881 Metabolic syndrome: Secondary | ICD-10-CM

## 2019-02-09 DIAGNOSIS — Z5112 Encounter for antineoplastic immunotherapy: Secondary | ICD-10-CM | POA: Diagnosis not present

## 2019-02-09 DIAGNOSIS — M8580 Other specified disorders of bone density and structure, unspecified site: Secondary | ICD-10-CM | POA: Diagnosis not present

## 2019-02-09 LAB — CBC WITH DIFFERENTIAL (CANCER CENTER ONLY)
Abs Immature Granulocytes: 0.01 10*3/uL (ref 0.00–0.07)
Basophils Absolute: 0 10*3/uL (ref 0.0–0.1)
Basophils Relative: 1 %
Eosinophils Absolute: 0.1 10*3/uL (ref 0.0–0.5)
Eosinophils Relative: 2 %
HCT: 37.9 % (ref 36.0–46.0)
Hemoglobin: 13.1 g/dL (ref 12.0–15.0)
Immature Granulocytes: 0 %
Lymphocytes Relative: 47 %
Lymphs Abs: 1.7 10*3/uL (ref 0.7–4.0)
MCH: 34.8 pg — ABNORMAL HIGH (ref 26.0–34.0)
MCHC: 34.6 g/dL (ref 30.0–36.0)
MCV: 100.8 fL — ABNORMAL HIGH (ref 80.0–100.0)
Monocytes Absolute: 0.4 10*3/uL (ref 0.1–1.0)
Monocytes Relative: 11 %
Neutro Abs: 1.4 10*3/uL — ABNORMAL LOW (ref 1.7–7.7)
Neutrophils Relative %: 39 %
Platelet Count: 126 10*3/uL — ABNORMAL LOW (ref 150–400)
RBC: 3.76 MIL/uL — ABNORMAL LOW (ref 3.87–5.11)
RDW: 12 % (ref 11.5–15.5)
WBC Count: 3.7 10*3/uL — ABNORMAL LOW (ref 4.0–10.5)
nRBC: 0 % (ref 0.0–0.2)

## 2019-02-09 LAB — CMP (CANCER CENTER ONLY)
ALT: 96 U/L — ABNORMAL HIGH (ref 0–44)
AST: 55 U/L — ABNORMAL HIGH (ref 15–41)
Albumin: 3.7 g/dL (ref 3.5–5.0)
Alkaline Phosphatase: 47 U/L (ref 38–126)
Anion gap: 9 (ref 5–15)
BUN: 10 mg/dL (ref 8–23)
CO2: 26 mmol/L (ref 22–32)
Calcium: 8.7 mg/dL — ABNORMAL LOW (ref 8.9–10.3)
Chloride: 104 mmol/L (ref 98–111)
Creatinine: 0.75 mg/dL (ref 0.44–1.00)
GFR, Est AFR Am: >60 mL/min (ref >60)
GFR, Estimated: >60 mL/min (ref >60)
Glucose, Bld: 119 mg/dL — ABNORMAL HIGH (ref 70–99)
Potassium: 3.9 mmol/L (ref 3.5–5.1)
Sodium: 139 mmol/L (ref 135–145)
Total Bilirubin: 0.6 mg/dL (ref 0.3–1.2)
Total Protein: 7 g/dL (ref 6.5–8.1)

## 2019-02-09 LAB — LIPID PANEL
Cholesterol: 225 mg/dL — ABNORMAL HIGH (ref 0–200)
HDL: 45 mg/dL (ref >40)
LDL Cholesterol: 155 mg/dL — ABNORMAL HIGH (ref 0–99)
Total CHOL/HDL Ratio: 5 RATIO
Triglycerides: 127 mg/dL (ref <150)
VLDL: 25 mg/dL (ref 0–40)

## 2019-02-09 LAB — TSH: TSH: 2.462 u[IU]/mL (ref 0.308–3.960)

## 2019-02-09 LAB — HEMOGLOBIN A1C
Hgb A1c MFr Bld: 5.4 % (ref 4.8–5.6)
Mean Plasma Glucose: 108.28 mg/dL

## 2019-02-09 MED ORDER — SODIUM CHLORIDE 0.9% FLUSH
10.0000 mL | Freq: Once | INTRAVENOUS | Status: AC
  Administered 2019-02-09: 10 mL
  Filled 2019-02-09: qty 10

## 2019-02-09 MED ORDER — SODIUM CHLORIDE 0.9 % IV SOLN
Freq: Once | INTRAVENOUS | Status: AC
  Administered 2019-02-09: 10:00:00 via INTRAVENOUS
  Filled 2019-02-09: qty 250

## 2019-02-09 MED ORDER — HEPARIN SOD (PORK) LOCK FLUSH 100 UNIT/ML IV SOLN
500.0000 [IU] | Freq: Once | INTRAVENOUS | Status: AC | PRN
  Administered 2019-02-09: 11:00:00 500 [IU]
  Filled 2019-02-09: qty 5

## 2019-02-09 MED ORDER — SODIUM CHLORIDE 0.9 % IV SOLN
200.0000 mg | Freq: Once | INTRAVENOUS | Status: AC
  Administered 2019-02-09: 200 mg via INTRAVENOUS
  Filled 2019-02-09: qty 8

## 2019-02-09 MED ORDER — SODIUM CHLORIDE 0.9% FLUSH
10.0000 mL | INTRAVENOUS | Status: DC | PRN
  Administered 2019-02-09: 11:00:00 10 mL
  Filled 2019-02-09: qty 10

## 2019-02-09 NOTE — Patient Instructions (Addendum)
[facility]charge Instructions for Patients Receiving Chemotherapy  Today you received the following immunotherapy agent:  Keytruda  To help prevent nausea and vomiting after your treatment, we encourage you to take your nausea medication as directed.  If you develop nausea and vomiting that is not controlled by your nausea medication, call the clinic.   BELOW ARE SYMPTOMS THAT SHOULD BE REPORTED IMMEDIATELY:  *FEVER GREATER THAN 100.5 F  *CHILLS WITH OR WITHOUT FEVER  NAUSEA AND VOMITING THAT IS NOT CONTROLLED WITH YOUR NAUSEA MEDICATION  *UNUSUAL SHORTNESS OF BREATH  *UNUSUAL BRUISING OR BLEEDING  TENDERNESS IN MOUTH AND THROAT WITH OR WITHOUT PRESENCE OF ULCERS  *URINARY PROBLEMS  *BOWEL PROBLEMS  UNUSUAL RASH Items with * indicate a potential emergency and should be followed up as soon as possible.  Feel free to call the clinic should you have any questions or concerns. The clinic phone number is [phone].  Please show the [facility] at check-in to the Emergency Department and triage nurse.   Leukopenia Leukopenia is a condition in which you have a low number of white blood cells. White blood cells help the body to fight infections. The number of white blood cells in the body varies from person to person. There are five types of white blood cells. Two types (lymphocytes and neutrophils) make up most of the white blood cell count. When lymphocytes are low, the condition is called lymphocytopenia. When neutrophils are low, it is called neutropenia. Neutropenia is the most dangerous type of leukopenia because it can lead to dangerous infections. What are the causes? This condition is commonly caused by damage to soft tissue inside of the bones (bone marrow), which is where most white blood cells are made. Bone marrow can get damaged by:  Medicine or X-ray treatments for cancer (chemotherapy or radiation therapy).  Serious infections.  Cancer  of the white blood cells (leukemia, lymphoma, or myeloma).  Medicines, including: ? Certain antibiotics. ? Certain heart medicines. ? Steroids. ? Certain medicines used to treat diseases of the immune system (autoimmune diseases), like rheumatoid arthritis. Leukopenia also happens when white blood cells are destroyed after leaving the bone marrow, which may result from:  Liver disease.  Autoimmune disease.  Vitamin B deficiencies. What are the signs or symptoms? One of the most common signs of leukopenia, especially severe neutropenia, is having a lot of bacterial infections. Different infections have different symptoms. An infection in your lungs may cause coughing. A urinary tract infection may cause frequent urination and a burning sensation. You may also get infections of the blood, skin, rectum, throat, sinuses, or ears. Some people have no symptoms. If you do have symptoms, they may include:  Fever.  Fatigue.  Swollen glands (lymph nodes).  Painful mouth ulcers.  Gum disease. How is this diagnosed? This condition may be diagnosed based on:  Your medical history.  A physical exam to check for swollen lymph nodes and an enlarged spleen. Your spleen is an organ on the left side of your body that stores white blood cells.  Tests, such as: ? A complete blood count. This blood test counts each type of white cell. ? Bone marrow aspiration. Some bone marrow is removed to be checked under a microscope. ? Lymph node biopsy. Some lymph node tissue is removed to be checked under a microscope. ? Other types of blood tests or imaging tests. How is this treated? Treatment of leukopenia depends on the cause. Some common treatments include:  Antibiotic medicine to treat bacterial infections.  Stopping medicines that may cause leukopenia.  Medicines to stimulate neutrophil production (hematopoietic growth factors), to treat neutropenia. Follow these instructions at home:  Take  over-the-counter and prescription medicines only as told by your health care provider. This includes supplements and vitamins.  If you were prescribed an antibiotic medicine, take it as told by your health care provider. Do not stop taking the antibiotic even if you start to feel better.  Preventing infection is important if you have leukopenia. To prevent infection: ? Avoid close contact with sick people. ? Wash your hands frequently with soap and water. If soap and water are not available, use hand sanitizer. ? Do not eat uncooked or undercooked meats. ? Wash fruits and vegetables before eating them. ? Do not eat or drink unpasteurized dairy products. ? Get regular dental care, and maintain good dental hygiene. You should visit the dentist at least once every 6 months.  Keep all follow-up visits as told by your health care provider. This is important. Contact a health care provider if:  You have chills or a fever.  You have symptoms of an infection. Get help right away if:  You have a fever that lasts for more than 2-3 days.  You have symptoms that last for more than 2-3 days.  You have trouble breathing.  You have chest pain. This information is not intended to replace advice given to you by your health care provider. Make sure you discuss any questions you have with your health care provider. Document Released: 09/06/2013 Document Revised: 07/22/2016 Document Reviewed: 07/22/2016 Elsevier Interactive Patient Education  2019 [employer].

## 2019-02-09 NOTE — Progress Notes (Signed)
Per [name], ok to treat with ANC 1.4, ALT 96.

## 2019-02-10 ENCOUNTER — Telehealth: Payer: Self-pay

## 2019-02-10 ENCOUNTER — Telehealth: Payer: Self-pay | Admitting: Family Medicine

## 2019-02-10 NOTE — Telephone Encounter (Signed)
Left detailed vm advising pt of results and asked that she rtn call if any questions and also to see if she would be ok w/increasing the dosage of the Livalo.Dawn [name], [place]

## 2019-02-10 NOTE — Telephone Encounter (Signed)
Spoke with patient regarding lab results, per [name] TSH normal, stable mildly elevated liver enzymes, cholesterol level is elevated, instructed to follow up with PCP, Hbg A1C wnl, patient verbalized an understanding.

## 2019-02-10 NOTE — Telephone Encounter (Signed)
Please call patient and let her know that Dr. Krista Blue did forward the results of her blood work.  Her A1c looks fantastic.  No sign of diabetes or prediabetes.  Overall her cholesterol is fair.  Her levels are still elevated.  Her triglycerides do look better compared to last time so great work and bringing that down.  Your LDL still mildly elevated at 155.  It looks great 3 years ago when it was down to 105.  I believe you are still taking 1-1/2 tabs of the Livalo.  We always have room to go up if you are comfortable with that.  If not then we can still just stay at the 1-1/2.

## 2019-02-10 NOTE — Telephone Encounter (Signed)
-----   Message from Truitt Merle, MD sent at 02/10/2019  8:09 AM EDT ----- Please let pt know her lab results, TSH normalized now, stable mildly elevated liver enzymes, cholesterol level elevated (f/u with PCP), no other concerns, thanks  Truitt Merle  02/10/2019

## 2019-02-11 DIAGNOSIS — M25512 Pain in left shoulder: Secondary | ICD-10-CM | POA: Diagnosis not present

## 2019-02-21 ENCOUNTER — Other Ambulatory Visit: Payer: Self-pay | Admitting: Family Medicine

## 2019-02-21 ENCOUNTER — Other Ambulatory Visit: Payer: Self-pay | Admitting: Hematology

## 2019-02-21 MED FILL — LEVOTHYROXINE 50 MCG TABLET: 50 | 30 days supply | Qty: 30 | Fill #0

## 2019-02-23 MED FILL — LIDOCAINE-PRILOCAINE CREAM: 2.5-2.5 | 10 days supply | Qty: 30 | Fill #0

## 2019-02-28 NOTE — Progress Notes (Signed)
Dawn [name]   Telephone:([phone]) [phone] Fax:(336) [phone]   Clinic Follow up Note   Patient Care Team: [name], MD as PCP - Haskell Riling, MD as Consulting Physician (General Surgery) Truitt Merle, MD as Consulting Physician (Hematology) [name], MD as Attending Physician (Radiation Oncology)  Date of Service:  03/03/2019  CHIEF COMPLAINT: F/u on breast and endometrial cancers  SUMMARY OF ONCOLOGIC HISTORY: Oncology History Overview Note  MSI high Cancer Staging Endometrial cancer [facility]) Staging form: Corpus Uteri - Adenosarcoma, AJCC 8th Edition - Pathologic stage from 06/09/2017: Stage IIIC (pT1a, pN1, cM0) - Signed by Truitt Merle, MD on 06/16/2017  Malignant neoplasm of upper-inner quadrant of right breast in female, estrogen receptor positive ([place]) Staging form: Breast, AJCC 8th Edition - Clinical stage from 05/06/2017: Stage IA (cT1c, cN0, cM0, G2, ER: Positive, PR: Negative, HER2: Positive) - Unsigned - Pathologic stage from 05/25/2017: Stage IIA (pT2, pN0, cM0, G2, ER: Positive, PR: Negative, HER2: Negative) - Signed by Truitt Merle, MD on 06/16/2017     Malignant neoplasm of upper-inner quadrant of right breast in female, estrogen receptor positive ([place])  04/27/2017 Initial Biopsy   Diagnosis Breast, right, needle core biopsy INVASIVE DUCTAL CARCINOMA, GRADE 2 Microscopic Comment The neoplasm has intracellular mucin and signet ring cell features. Immunostains shows these cells are positive for ER,GATA3, ck7 and GCDFP, negative for ck20, cdx2, TTF-1 and pax8, The immunostaining pattern supports the neoplasm is breast primary.   04/27/2017 Receptors her2   Estrogen Receptor: 70%, POSITIVE, MODERATE STAINING INTENSITY Progesterone Receptor: 0%, NEGATIVE Proliferation Marker Ki67: 30% HER2 - **POSITIVE** RATIO OF HER2/CEP17 SIGNALS 2.91 AVERAGE HER2 COPY NUMBER PER CELL 7.28   04/27/2017 Initial Diagnosis   Malignant neoplasm of  upper-inner quadrant of right breast in female, estrogen receptor positive ([place])   05/07/2017 Imaging   CT Chest W Contrast 05/07/17 IMPRESSION: Tiny well-defined fatty lesion on the pleura at the right lung base. The thinner slice collimation used for today's chest CT eliminates volume-averaging seen in the lesion on the prior exam and confirms that this is a diffusely fatty nodule. This is a benign finding and likely represents a tiny lipoma. No defect in the hemidiaphragm evident to suggest tiny diaphragmatic hernia. Pulmonary hamartoma a consideration although the lack of soft tissue components makes this less likely.   05/15/2017 Genetic Testing   Patient had genetic testing due to a personal history of breast cancer and uterine cancer as well as a family history of cancer. The Multi-Cancer panel was ordered. The Multi-Cancer Panel offered by [facility] includes sequencing and/or deletion duplication testing of the following 83 genes: ALK, APC, ATM, AXIN2,BAP1,  BARD1, BLM, BMPR1A, BRCA1, BRCA2, BRIP1, CASR, CDC73, CDH1, CDK4, CDKN1B, CDKN1C, CDKN2A (p14ARF), CDKN2A (p16INK4a), CEBPA, CHEK2, CTNNA1, DICER1, DIS3L2, EGFR (c.2369C>T, p.Thr790Met variant only), EPCAM (Deletion/duplication testing only), FH, FLCN, GATA2, GPC3, GREM1 (Promoter region deletion/duplication testing only), HOXB13 (c.251G>A, p.Gly84Glu), HRAS, KIT, MAX, MEN1, MET, MITF (c.952G>A, p.Glu318Lys variant only), MLH1, MSH2, MSH3, MSH6, MUTYH, NBN, NF1, NF2, NTHL1, PALB2, PDGFRA, PHOX2B, PMS2, POLD1, POLE, POT1, PRKAR1A, PTCH1, PTEN, RAD50, RAD51C, RAD51D, RB1, RECQL4, RET, RUNX1, SDHAF2, SDHA (sequence changes only), SDHB, SDHC, SDHD, SMAD4, SMARCA4, SMARCB1, SMARCE1, STK11, SUFU, TERC, TERT, TMEM127, TP53, TSC1, TSC2, VHL, WRN and WT1.   Results: No pathogenic mutations were identified. A VUS in the GATA2 gene c.1348G>A (p.Gly450Arg) was identified.  The date of this test report is 05/25/2017.     05/25/2017 Surgery   RIGHT  BREAST LUMPECTOMY WITH RADIOACTIVE SEED  AND  RIGHT AXILLARY SENTINEL LYMPH NODE BIOPSY and Pot placement by Dr. Excell Seltzer 05/25/17   05/25/2017 Pathology Results   Diagnosis 05/25/17 1. Breast, lumpectomy, right - INVASIVE DUCTAL CARCINOMA, GRADE II/III, SPANNING 2.2 CM. - DUCTAL CARCINOMA IN SITU, HIGH GRADE. - THE SURGICAL RESECTION MARGINS ARE NEGATIVE FOR CARCINOMA. - SEE ONCOLOGY TABLE BELOW. 2. Breast, excision, right additional superior margin - BENIGN FIBROADIPOSE TISSUE. - BENIGN SKELETAL MUSCLE. - SEE COMMENT. 3. Breast, excision, right additional lateral margin - BENIGN BREAST PARENCHYMA. - THERE IS NO EVIDENCE OF MALIGNANCY. - SEE COMMENT. 4. Breast, excision, chest wall margin - BENIGN FIBROADIPOSE TISSUE. - THERE IS NO EVIDENCE OF MALIGNANCY. - SEE COMMENT. 5. Lymph node, sentinel, biopsy, right axillary - THERE IS NO EVIDENCE OF CARCINOMA IN 1 OF 1 LYMPH NODE (0/1). 6. Lymph node, sentinel, biopsy, right axillary - THERE IS NO EVIDENCE OF CARCINOMA IN 1 OF 1 LYMPH NODE (0/1). 7. Lymph node, sentinel, biopsy, right axillary - THERE IS NO EVIDENCE OF CARCINOMA IN 1 OF 1 LYMPH NODE (0/1).   06/26/2017 PET scan   PET  IMPRESSION: 1. Postoperative findings both in the right breast and in the anatomic pelvis, with associated low-grade activity considered to be postoperative in nature. No hypermetabolic adenopathy or hypermetabolic lesions are identified to suggest active metastatic disease/malignancy. 2. Other imaging findings of potential clinical significance: Aortic Atherosclerosis (ICD10-I70.0). Sigmoid colon diverticulosis. Biapical pleuroparenchymal scarring in the lungs. Small amount of free pelvic fluid in the cul-de-sac, likely postoperative.    07/01/2017 - 10/14/2017 Chemotherapy   Adjuvant TCH with Onpro every 3 weeks for 6 cycles starting on 07/01/17, Changed Taxol to abraxane with cycle 4 due to drug rash reaction, followed by Herceptin every 3 weeks for  6 months    11/01/2017 - 12/08/2017 Radiation Therapy   Radiation therapy to her right breast with [name]   11/04/2017 - 06/2018 Chemotherapy   Maintenance Herceptin starting 11/04/17 and will comeplete her 12 months in 06/2018    12/23/2017 -  Anti-estrogen oral therapy   Adjuvant letrozole 2.5 mg daily started 12/23/17, switched to exemestane on 04/21/18 due to joint pain. Could not afford aromasin, started anastrozole 04/2018.    12/30/2017 Imaging   CT CAP W Contrast 12/30/17 IMPRESSION: Increased size of 1.1 cm aorto-caval retroperitoneal lymph node. This could be reactive due to interval hysterectomy, however metastatic carcinoma cannot be excluded. Consider continued attention on short-term follow-up CT, or PET-CT scan for further evaluation.  Mild hepatic steatosis.   01/25/2018 PET scan   IMPRESSION: 1. Enlarging hypermetabolic aortocaval lymph node is worrisome for metastatic disease. 2. Probable postoperative seroma in the medial right breast, with associated mild inflammatory hypermetabolism.   02/17/2018 -  Antibody Plan   Keytruda every 3 weeks starting 02/17/18. Plan for 2 years    04/28/2018 Mammogram   Probably benign. Calcifications in the right breast most likely are dystrophic, related to previous surgery, and are probably benign.    04/28/2018 Imaging   DEXA Scan T Score -2.0   06/10/2018 Echocardiogram   06/10/2018 ECHO LV EF: 55% -   60%   08/28/2018 - 08/2018 Chemotherapy   Neratinib '4mg'$  for 1 week then titrate up with 1 additional tablet weekly up to 12 mg starting 08/28/18. Stopped soon after starting due to diarrhea.   10/25/2018 Imaging   CT CAP 10/25/18  IMPRESSION: 1. No evidence of metastatic disease. 2.  Aortic atherosclerosis (ICD10-170.0).   Endometrial adenocarcinoma ([place])  05/07/2017 Initial Diagnosis   Endometrial  adenocarcinoma ([place])   06/09/2017 Surgery   XI ROBOTIC ASSISTED TOTAL LAPOROSCOPIC HYSTERECTOMY WITH BILATERAL SALPINGO OOPHORECTOMY  and SENTINEL NODE BIOPSY by Dr. Denman George 06/09/17   06/09/2017 Pathology Results   Diagnosis 06/09/17 1. Lymph node, sentinel, biopsy, right external iliac - METASTATIC ADENOCARCINOMA IN ONE LYMPH NODE (1/1). 2. Lymph node, sentinel, biopsy, left obturator - ONE BENIGN LYMPH NODE (0/1). 3. Lymph node, sentinel, biopsy, left external iliac - METASTATIC ADENOCARCINOMA IN ONE LYMPH NODE (1/1). 4. Uterus +/- tubes/ovaries, neoplastic ENDOMETRIUM: - ENDOMETRIAL ADENOCARCINOMA, 3.2 CM. - CARCINOMA INVADES INNER HALF OF MYOMETRIUM. 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 - CERVIX, BILATERAL FALLOPIAN TUBES AND BILATERAL OVARIES FREE OF TUMOR   08/18/2017 - 09/17/2017 Radiation Therapy   vaginal brachytherapy per 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   02/17/2018 -  Chemotherapy   Keytruda every 3 weeks starting 02/17/18      CURRENT THERAPY:  -Keytruda every 3 weeksstarting 02/17/18. -Letrozole 2.'5mg'$  daily starting 12/23/17. Switched to Anastrozole in 04/2018 due to joint pain  INTERVAL HISTORY:  Dawn [name] is here for a follow up and ongoing treatment. She presents to the clinic alone. She notes she is doing well. She went to see orthopedist who says she had impingement of her shoulder. She was given cortisone shot. She still has pain when reaching in certain directions.     REVIEW OF SYSTEMS:   Constitutional: Denies fevers, chills or abnormal weight loss Eyes: Denies blurriness of vision Ears, nose, mouth, throat, and face: Denies mucositis or sore throat Respiratory: Denies cough, dyspnea or wheezes Cardiovascular: Denies palpitation, chest discomfort or lower extremity swelling Gastrointestinal:  Denies nausea, heartburn or change in bowel habits Skin: Denies abnormal skin rashes MSK: (+) left shoulder pain  Lymphatics: Denies new lymphadenopathy or easy  bruising Neurological:Denies numbness, tingling or new weaknesses Behavioral/Psych: Mood is stable, no new changes  All other systems were reviewed with the patient and are negative.  MEDICAL HISTORY:  Past Medical History:  Diagnosis Date   Anemia    as a child.   Arthritis    Bilateral cataracts    Bilateral leg cramps    Cancer ([place])    skin   Dyspnea    Family history of bladder cancer    Family history of colon cancer in father    Vira Agar' corneal dystrophy    GERD (gastroesophageal reflux disease)    Hearing loss    Right side 30%   History of hiatal hernia    small size   History of radiation therapy 08/21/17, 08/28/17,09/01/17, 09/11/17, 09/17/17   Vaginal cuff brachytherapy.    History of radiation therapy 11/11/17- 12/08/17   Right Breast treated to 40.05 Gy with 15 fx of 2.67 Gy and a boost of 10 Gy with 5 fx.    Hyperlipidemia    Hypertension    IBS (irritable bowel syndrome)    hx of   Malignant neoplasm of upper-inner quadrant of right female breast ([place]) 04/2017   NAFL (nonalcoholic fatty liver)    Obesity    PONV (postoperative nausea and vomiting)    Tuberculosis    tested positive, mother had when patient was child   Uterine cancer ([place]) 04/2017   endometrial cancer   Varices, gastric    Vertigo     SURGICAL HISTORY: Past Surgical History:  Procedure Laterality Date   BREAST  BIOPSY Right 04/27/2017   BREAST LUMPECTOMY WITH RADIOACTIVE SEED AND SENTINEL LYMPH NODE BIOPSY Right 05/25/2017   Procedure: RIGHT BREAST LUMPECTOMY WITH RADIOACTIVE SEED AND  RIGHT AXILLARY SENTINEL LYMPH NODE BIOPSY;  Surgeon: Excell Seltzer, MD;  Location: [facility];  Service: General;  Laterality: Right;   CATARACT EXTRACTION, BILATERAL     COLONOSCOPY     HYSTEROSCOPY W/D&C N/A 04/29/2017   Procedure: DILATATION AND CURETTAGE /HYSTEROSCOPY;  Surgeon: Linda Hedges, DO;  Location: [facility];  Service: Gynecology;  Laterality: N/A;     PORTACATH PLACEMENT Left 05/25/2017   Procedure: INSERTION PORT-A-CATH;  Surgeon: Excell Seltzer, MD;  Location: [facility];  Service: General;  Laterality: Left;   ROBOTIC ASSISTED TOTAL HYSTERECTOMY WITH BILATERAL SALPINGO OOPHERECTOMY N/A 06/09/2017   Procedure: XI ROBOTIC ASSISTED TOTAL LAPOROSCOPIC HYSTERECTOMY WITH BILATERAL SALPINGO OOPHORECTOMY;  Surgeon: Everitt Amber, MD;  Location: [facility];  Service: Gynecology;  Laterality: N/A;   SENTINEL NODE BIOPSY N/A 06/09/2017   Procedure: SENTINEL NODE BIOPSY;  Surgeon: Everitt Amber, MD;  Location: [facility];  Service: Gynecology;  Laterality: N/A;    I have reviewed the social history and family history with the patient and they are unchanged from previous note.  ALLERGIES:  is allergic to ciprofloxacin; crestor [rosuvastatin calcium]; fosamax [alendronate sodium]; penicillins; and taxol [paclitaxel].  MEDICATIONS:  Current Outpatient Medications  Medication Sig Dispense Refill   acetaminophen (TYLENOL) 500 MG tablet Take 500 mg by mouth every 6 (six) hours as needed for moderate pain.     anastrozole (ARIMIDEX) 1 MG tablet Take 1 tablet (1 mg total) by mouth daily. 90 tablet 3   Ascorbic Acid (VITAMIN C) 1000 MG tablet Take 1,000 mg by mouth daily.     b complex vitamins capsule Take 1 capsule by mouth daily.     Calcium Carbonate-Vitamin D3 (CALCIUM 600-D) 600-400 MG-UNIT TABS Take 1-2 tablets by mouth daily.     diphenhydrAMINE (BENADRYL) 25 MG tablet Take 25-50 mg by mouth every 6 (six) hours as needed for sleep.     ibandronate (BONIVA) 150 MG tablet Take in the morning, once a month,  with a full glass of water,on an empty stomach,do not take anything else by mouth or lie down for the next 30 min. 3 tablet 3   ibuprofen (ADVIL,MOTRIN) 200 MG tablet Take 200 mg by mouth every 6 (six) hours as needed.     levothyroxine (SYNTHROID) 50 MCG tablet TAKE 1 TABLET (50 MCG TOTAL) BY MOUTH DAILY. 30 tablet 1    lidocaine-prilocaine (EMLA) cream APPLY TO AFFECTED AREA ONCE 30 g 2   Multiple Vitamin (MULTIVITAMIN) capsule Take 1 capsule by mouth daily.       olmesartan-hydrochlorothiazide (BENICAR HCT) 20-12.5 MG tablet Take 1 tablet by mouth daily. 90 tablet 2   Pitavastatin Calcium (LIVALO) 2 MG TABS Take 1.5 tablets (3 mg total) by mouth every evening. Takes 1.5 tablet 135 tablet 3   prednisoLONE acetate (PRED FORTE) 1 % ophthalmic suspension Place 1 drop into the left eye 3 (three) times daily.     sodium chloride (MURO 128) 5 % ophthalmic solution Place 2 drops into both eyes daily.     tiZANidine (ZANAFLEX) 2 MG tablet Take 1 tablet (2 mg total) by mouth at bedtime as needed for muscle spasms. 30 tablet 1   triamcinolone cream (KENALOG) 0.1 % APPLY TO ITCHY SPOTS ONCE DAILY  1   No current facility-administered medications for this visit.    Facility-Administered Medications Ordered  in Other Visits  Medication Dose Route Frequency Provider Last Rate Last Dose   sodium chloride flush (NS) 0.9 % injection 10 mL  10 mL Intracatheter PRN Truitt Merle, MD   10 mL at 08/25/18 1111    PHYSICAL EXAMINATION: ECOG PERFORMANCE STATUS: 1 - Symptomatic but completely ambulatory  Vitals:   03/03/19 1135  BP: 129/85  Pulse: 66  Resp: 18  Temp: 98 F (36.7 C)  SpO2: 97%   Filed Weights   03/03/19 1135  Weight: 191 lb (86.6 kg)    GENERAL:alert, no distress and comfortable SKIN: skin color, texture, turgor are normal, no rashes or significant lesions EYES: normal, Conjunctiva are pink and non-injected, sclera clear  NECK: supple, thyroid normal size, non-tender, without nodularity LYMPH:  no palpable lymphadenopathy in the cervical, axillary  LUNGS: clear to auscultation and percussion with normal breathing effort HEART: regular rate & rhythm and no murmurs and no lower extremity edema ABDOMEN:abdomen soft, non-tender and normal bowel sounds Musculoskeletal:no cyanosis of digits and no  clubbing  NEURO: alert & oriented x 3 with fluent speech, no focal motor/sensory deficits  LABORATORY DATA:  I have reviewed the data as listed CBC Latest Ref Rng & Units 03/03/2019 02/09/2019 01/20/2019  WBC 4.0 - 10.5 K/uL 4.3 3.7(L) 4.0  Hemoglobin 12.0 - 15.0 g/dL 13.5 13.1 13.4  Hematocrit 36.0 - 46.0 % 39.0 37.9 38.8  Platelets 150 - 400 K/uL 157 126(L) 155     CMP Latest Ref Rng & Units 03/03/2019 02/09/2019 01/20/2019  Glucose 70 - 99 mg/dL 98 119(H) 116(H)  BUN 8 - 23 mg/dL '10 10 13  '$ Creatinine 0.44 - 1.00 mg/dL 0.77 0.75 0.75  Sodium 135 - 145 mmol/L 138 139 136  Potassium 3.5 - 5.1 mmol/L 3.6 3.9 3.7  Chloride 98 - 111 mmol/L 102 104 102  CO2 22 - 32 mmol/L '28 26 26  '$ Calcium 8.9 - 10.3 mg/dL 9.0 8.7(L) 9.0  Total Protein 6.5 - 8.1 g/dL 7.3 7.0 7.2  Total Bilirubin 0.3 - 1.2 mg/dL 0.7 0.6 0.6  Alkaline Phos 38 - 126 U/L 52 47 50  AST 15 - 41 U/L 60(H) 55(H) 52(H)  ALT 0 - 44 U/L 127(H) 96(H) 81(H)      RADIOGRAPHIC STUDIES: I have personally reviewed the radiological images as listed and agreed with the findings in the report. No results found.   ASSESSMENT & PLAN:  Dawn [name] is a 68 y.o. female with   1. 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 Treated with right breast lumpectomy, adjuvant chemo and radiation.She tried Nerlynx but could not tolerate due to diarrhea.  -She started anti-estrogen therapy with letrozole. Due to joint pain I switched her to Anastrozole in 04/2018. Tolerating well. -She tried neratinib, had a severe diarrhea, and does not want try again. -She is clinically doing well and stable.  -Continue anastrozole  -F/u in 6 weeks  2. Endometrial adenocarcinomawith lymph node metastasis, pT1apN1M0, FIGO stage IIIc, MSI-H, probable RP node recurrence in 12/2017 -Diagnosed in 04/2017. Treated with hysterectomy, BSO, adjuvant radiation, and chemo  with Carbotaxol. Currently on Keytruda every 3 weeksfor presumed retroperitoneal lymph node metastasis. Tolerating well. -LatestCT Cap from 10/25/18 which shows No evidence ofmetastaticdisease.I reviewed her scan images myself. She is responding well to [facility]. She will continue to complete 2 years treatment.If noevidence of progression, plan to complete in 02/2020. -She is currently on antibody Keytruda every 3 weeks  since 02/17/18. Tolerating well.  -She is clinically stable. Labs reviewed, CBC and CMP WNL except AST 60, ALT 127. TSH is still pending. Overall adequate to proceed with Keytruda today and every 3 weeks.  -Next CT scan in 04/2019 before next visit.  -F/u in 6 weeks   3. HTN -Controlled, Continue tof/u with PCP  -BP at 129/85 (03/03/19)  4. Transaminitis -Secondary to fatty liver disease -Stable   5.Osteopenia -DEXA scan in 05/2018 revealed T score of -2.0. -She is on monthly boniva, prescribed by her PCP, since 02/2018.Continue -ContinueVitamin D and Calcium supplements. -will check her Vitamin D level every 6 months.   6. Metabolic syndrome, hyperglycemia  -She has no history of diabetes. Her random blood glucose was 213, we discussed diabetic diet and exercise -She was recently found to have abnormal TSH levels. She has been seen by endocrinologist and has started levothyroxine on 01/04/19. Will monitor TSH level every 3 weeks.  -Her weight does continue to trend up. I encouraged her to remain active.  -Latest TSH at 2.462, HB A1c at 5.4 (03/03/19) -Continue to follow-up with her primary care physician and Endocrinologist.   7. Acute Left shoulder pain  -She was seen by Orthopedist for impingement of left shoulder.  -She is receiving cortisone shots which moderately helps.  Plan -Labs reviewed and adequate to proceed with Keytruda today -Continue Anastrozole -Lab, flush and Keytruda in 3, 6 and 9 weeks  -F/u in 6 weeks with CT AP W Contrast a  few days before     No problem-specific Assessment & Plan notes found for this encounter.   Orders Placed This Encounter  Procedures   CT Abdomen Pelvis W Contrast    Standing Status:   Future    Standing Expiration Date:   03/02/2020    Order Specific Question:   If indicated for the ordered procedure, I authorize the administration of contrast media per Radiology protocol    Answer:   Yes    Order Specific Question:   Preferred imaging location?    Answer:   [facility]    Order Specific Question:   Is Oral Contrast requested for this exam?    Answer:   Yes, Per Radiology protocol    Order Specific Question:   Radiology Contrast Protocol - do NOT remove file path    Answer:   [email or web address]   All questions were answered. The patient knows to call the clinic with any problems, questions or concerns. No barriers to learning was detected. I spent 20 minutes counseling the patient face to face. The total time spent in the appointment was 25 minutes and more than 50% was on counseling and review of test results     Truitt Merle, MD 03/03/2019   I, Joslyn Devon, am acting as scribe for Truitt Merle, MD.   I have reviewed the above documentation for accuracy and completeness, and I agree with the above.

## 2019-03-03 ENCOUNTER — Inpatient Hospital Stay: Payer: Medicare Other

## 2019-03-03 ENCOUNTER — Other Ambulatory Visit: Payer: Self-pay

## 2019-03-03 ENCOUNTER — Inpatient Hospital Stay: Payer: Medicare Other | Attending: Hematology

## 2019-03-03 ENCOUNTER — Inpatient Hospital Stay (HOSPITAL_BASED_OUTPATIENT_CLINIC_OR_DEPARTMENT_OTHER): Payer: Medicare Other | Admitting: Hematology

## 2019-03-03 ENCOUNTER — Encounter: Payer: Self-pay | Admitting: Hematology

## 2019-03-03 VITALS — BP 129/85 | HR 66 | Temp 98.0°F | Resp 18 | Ht 63.0 in | Wt 191.0 lb

## 2019-03-03 DIAGNOSIS — Z9221 Personal history of antineoplastic chemotherapy: Secondary | ICD-10-CM

## 2019-03-03 DIAGNOSIS — Z5112 Encounter for antineoplastic immunotherapy: Secondary | ICD-10-CM | POA: Insufficient documentation

## 2019-03-03 DIAGNOSIS — Z170 Estrogen receptor positive status [ER+]: Secondary | ICD-10-CM

## 2019-03-03 DIAGNOSIS — Z90710 Acquired absence of both cervix and uterus: Secondary | ICD-10-CM | POA: Insufficient documentation

## 2019-03-03 DIAGNOSIS — Z880 Allergy status to penicillin: Secondary | ICD-10-CM | POA: Insufficient documentation

## 2019-03-03 DIAGNOSIS — Z881 Allergy status to other antibiotic agents status: Secondary | ICD-10-CM

## 2019-03-03 DIAGNOSIS — Z79811 Long term (current) use of aromatase inhibitors: Secondary | ICD-10-CM | POA: Insufficient documentation

## 2019-03-03 DIAGNOSIS — E039 Hypothyroidism, unspecified: Secondary | ICD-10-CM

## 2019-03-03 DIAGNOSIS — Z8542 Personal history of malignant neoplasm of other parts of uterus: Secondary | ICD-10-CM | POA: Diagnosis not present

## 2019-03-03 DIAGNOSIS — C541 Malignant neoplasm of endometrium: Secondary | ICD-10-CM

## 2019-03-03 DIAGNOSIS — Z800 Family history of malignant neoplasm of digestive organs: Secondary | ICD-10-CM | POA: Diagnosis not present

## 2019-03-03 DIAGNOSIS — Z923 Personal history of irradiation: Secondary | ICD-10-CM

## 2019-03-03 DIAGNOSIS — I10 Essential (primary) hypertension: Secondary | ICD-10-CM | POA: Diagnosis not present

## 2019-03-03 DIAGNOSIS — C50211 Malignant neoplasm of upper-inner quadrant of right female breast: Secondary | ICD-10-CM | POA: Diagnosis not present

## 2019-03-03 DIAGNOSIS — E785 Hyperlipidemia, unspecified: Secondary | ICD-10-CM

## 2019-03-03 DIAGNOSIS — M810 Age-related osteoporosis without current pathological fracture: Secondary | ICD-10-CM | POA: Insufficient documentation

## 2019-03-03 DIAGNOSIS — K760 Fatty (change of) liver, not elsewhere classified: Secondary | ICD-10-CM | POA: Diagnosis not present

## 2019-03-03 DIAGNOSIS — M25512 Pain in left shoulder: Secondary | ICD-10-CM

## 2019-03-03 DIAGNOSIS — R946 Abnormal results of thyroid function studies: Secondary | ICD-10-CM | POA: Insufficient documentation

## 2019-03-03 DIAGNOSIS — C779 Secondary and unspecified malignant neoplasm of lymph node, unspecified: Secondary | ICD-10-CM

## 2019-03-03 DIAGNOSIS — I700 Atherosclerosis of aorta: Secondary | ICD-10-CM | POA: Insufficient documentation

## 2019-03-03 DIAGNOSIS — E8881 Metabolic syndrome: Secondary | ICD-10-CM

## 2019-03-03 DIAGNOSIS — Z8052 Family history of malignant neoplasm of bladder: Secondary | ICD-10-CM | POA: Diagnosis not present

## 2019-03-03 DIAGNOSIS — M8580 Other specified disorders of bone density and structure, unspecified site: Secondary | ICD-10-CM | POA: Diagnosis not present

## 2019-03-03 DIAGNOSIS — Z79899 Other long term (current) drug therapy: Secondary | ICD-10-CM | POA: Diagnosis not present

## 2019-03-03 DIAGNOSIS — Z95828 Presence of other vascular implants and grafts: Secondary | ICD-10-CM

## 2019-03-03 DIAGNOSIS — R7401 Elevation of levels of liver transaminase levels: Secondary | ICD-10-CM

## 2019-03-03 LAB — CMP (CANCER CENTER ONLY)
ALT: 127 U/L — ABNORMAL HIGH (ref 0–44)
AST: 60 U/L — ABNORMAL HIGH (ref 15–41)
Albumin: 3.8 g/dL (ref 3.5–5.0)
Alkaline Phosphatase: 52 U/L (ref 38–126)
Anion gap: 8 (ref 5–15)
BUN: 10 mg/dL (ref 8–23)
CO2: 28 mmol/L (ref 22–32)
Calcium: 9 mg/dL (ref 8.9–10.3)
Chloride: 102 mmol/L (ref 98–111)
Creatinine: 0.77 mg/dL (ref 0.44–1.00)
GFR, Est AFR Am: >60 mL/min (ref >60)
GFR, Estimated: >60 mL/min (ref >60)
Glucose, Bld: 98 mg/dL (ref 70–99)
Potassium: 3.6 mmol/L (ref 3.5–5.1)
Sodium: 138 mmol/L (ref 135–145)
Total Bilirubin: 0.7 mg/dL (ref 0.3–1.2)
Total Protein: 7.3 g/dL (ref 6.5–8.1)

## 2019-03-03 LAB — CBC WITH DIFFERENTIAL (CANCER CENTER ONLY)
Abs Immature Granulocytes: 0.01 10*3/uL (ref 0.00–0.07)
Basophils Absolute: 0 10*3/uL (ref 0.0–0.1)
Basophils Relative: 1 %
Eosinophils Absolute: 0.1 10*3/uL (ref 0.0–0.5)
Eosinophils Relative: 2 %
HCT: 39 % (ref 36.0–46.0)
Hemoglobin: 13.5 g/dL (ref 12.0–15.0)
Immature Granulocytes: 0 %
Lymphocytes Relative: 45 %
Lymphs Abs: 1.9 10*3/uL (ref 0.7–4.0)
MCH: 35.2 pg — ABNORMAL HIGH (ref 26.0–34.0)
MCHC: 34.6 g/dL (ref 30.0–36.0)
MCV: 101.6 fL — ABNORMAL HIGH (ref 80.0–100.0)
Monocytes Absolute: 0.6 10*3/uL (ref 0.1–1.0)
Monocytes Relative: 13 %
Neutro Abs: 1.7 10*3/uL (ref 1.7–7.7)
Neutrophils Relative %: 39 %
Platelet Count: 157 10*3/uL (ref 150–400)
RBC: 3.84 MIL/uL — ABNORMAL LOW (ref 3.87–5.11)
RDW: 12 % (ref 11.5–15.5)
WBC Count: 4.3 10*3/uL (ref 4.0–10.5)
nRBC: 0 % (ref 0.0–0.2)

## 2019-03-03 LAB — TSH: TSH: 2.357 u[IU]/mL (ref 0.308–3.960)

## 2019-03-03 MED ORDER — SODIUM CHLORIDE 0.9% FLUSH
10.0000 mL | Freq: Once | INTRAVENOUS | Status: AC
  Administered 2019-03-03: 11:00:00 10 mL
  Filled 2019-03-03: qty 10

## 2019-03-03 MED ORDER — SODIUM CHLORIDE 0.9 % IV SOLN
Freq: Once | INTRAVENOUS | Status: AC
  Administered 2019-03-03: 14:00:00 via INTRAVENOUS
  Filled 2019-03-03: qty 250

## 2019-03-03 MED ORDER — HEPARIN SOD (PORK) LOCK FLUSH 100 UNIT/ML IV SOLN
500.0000 [IU] | Freq: Once | INTRAVENOUS | Status: AC | PRN
  Administered 2019-03-03: 500 [IU]
  Filled 2019-03-03: qty 5

## 2019-03-03 MED ORDER — SODIUM CHLORIDE 0.9 % IV SOLN
200.0000 mg | Freq: Once | INTRAVENOUS | Status: AC
  Administered 2019-03-03: 200 mg via INTRAVENOUS
  Filled 2019-03-03: qty 8

## 2019-03-03 MED ORDER — SODIUM CHLORIDE 0.9% FLUSH
10.0000 mL | INTRAVENOUS | Status: DC | PRN
  Administered 2019-03-03: 10 mL
  Filled 2019-03-03: qty 10

## 2019-03-03 NOTE — Patient Instructions (Signed)
[facility] Discharge Instructions for Patients Receiving Chemotherapy  Today you received the following chemotherapy agents: Pembrolizumab (Keytruda)  To help prevent nausea and vomiting after your treatment, we encourage you to take your nausea medication as directed.   If you develop nausea and vomiting that is not controlled by your nausea medication, call the clinic.   BELOW ARE SYMPTOMS THAT SHOULD BE REPORTED IMMEDIATELY:  *FEVER GREATER THAN 100.5 F  *CHILLS WITH OR WITHOUT FEVER  NAUSEA AND VOMITING THAT IS NOT CONTROLLED WITH YOUR NAUSEA MEDICATION  *UNUSUAL SHORTNESS OF BREATH  *UNUSUAL BRUISING OR BLEEDING  TENDERNESS IN MOUTH AND THROAT WITH OR WITHOUT PRESENCE OF ULCERS  *URINARY PROBLEMS  *BOWEL PROBLEMS  UNUSUAL RASH Items with * indicate a potential emergency and should be followed up as soon as possible.  Feel free to call the clinic should you have any questions or concerns. The clinic phone number is [phone].  Please show the CHEMO ALERT CARD at check-in to the Emergency Department and triage nurse.  Coronavirus (COVID-19) Are you at risk?  Are you at risk for the Coronavirus (COVID-19)?  To be considered HIGH RISK for Coronavirus (COVID-19), you have to meet the following criteria:  . Traveled to [place], [place], [name], [place] or [place]; or in the [place] to [place], [place], [place], or [place]; and have fever, cough, and shortness of breath within the last 2 weeks of travel OR . Been in close contact with a person diagnosed with COVID-19 within the last 2 weeks and have fever, cough, and shortness of breath . IF YOU DO NOT MEET THESE CRITERIA, YOU ARE CONSIDERED LOW RISK FOR COVID-19.  What to do if you are HIGH RISK for COVID-19?  . If you are having a medical emergency, call 911. . Seek medical care right away. Before you go to a doctor's office, urgent care or emergency department, call ahead and tell them  about your recent travel, contact with someone diagnosed with COVID-19, and your symptoms. You should receive instructions from your physician's office regarding next steps of care.  . When you arrive at healthcare provider, tell the healthcare staff immediately you have returned from visiting [place], [place], [place], [place] or [name]; or traveled in the [place] to [place], [place], [place], or [place]; in the last two weeks or you have been in close contact with a person diagnosed with COVID-19 in the last 2 weeks.   . Tell the health care staff about your symptoms: fever, cough and shortness of breath. . After you have been seen by a medical provider, you will be either: o Tested for (COVID-19) and discharged home on quarantine except to seek medical care if symptoms worsen, and asked to  - Stay home and avoid contact with others until you get your results (4-5 days)  - Avoid travel on public transportation if possible (such as bus, train, or airplane) or o Sent to the Emergency Department by EMS for evaluation, COVID-19 testing, and possible admission depending on your condition and test results.  What to do if you are LOW RISK for COVID-19?  Reduce your risk of any infection by using the same precautions used for avoiding the common cold or flu:  . Wash your hands often with soap and warm water for at least 20 seconds.  If soap and water are not readily available, use an alcohol-based hand sanitizer with at least 60% alcohol.  . If coughing or   sneezing, cover your mouth and nose by coughing or sneezing into the elbow areas of your shirt or coat, into a tissue or into your sleeve (not your hands). . Avoid shaking hands with others and consider head nods or verbal greetings only. . Avoid touching your eyes, nose, or mouth with unwashed hands.  . Avoid close contact with people who are sick. . Avoid places or events with large numbers of people in one location, like concerts or  sporting events. . Carefully consider travel plans you have or are making. . If you are planning any travel outside or inside the [place], visit the CDC's Travelers' Health webpage for the latest health notices. . If you have some symptoms but not all symptoms, continue to monitor at home and seek medical attention if your symptoms worsen. . If you are having a medical emergency, call 911.   ADDITIONAL HEALTHCARE OPTIONS FOR PATIENTS  [facility] / e-Visit: [email or web address]         [facility]: [phone]  [facility]: [phone]                   [facility]: [phone]   

## 2019-03-04 ENCOUNTER — Telehealth: Payer: Self-pay | Admitting: Hematology

## 2019-03-04 NOTE — Telephone Encounter (Signed)
Scheduled appt per 6/18 los. Spoke with patient and patient aware of appt date and time.

## 2019-03-09 ENCOUNTER — Other Ambulatory Visit: Payer: Self-pay | Admitting: *Deleted

## 2019-03-09 MED ORDER — LEVOTHYROXINE SODIUM 50 MCG PO TABS: 50.0000 ug | ORAL_TABLET | Freq: Every day | ORAL | 1 refills | Status: DC

## 2019-03-14 ENCOUNTER — Other Ambulatory Visit: Payer: Self-pay | Admitting: *Deleted

## 2019-03-14 DIAGNOSIS — M810 Age-related osteoporosis without current pathological fracture: Secondary | ICD-10-CM

## 2019-03-14 DIAGNOSIS — M25512 Pain in left shoulder: Secondary | ICD-10-CM | POA: Diagnosis not present

## 2019-03-14 DIAGNOSIS — M7502 Adhesive capsulitis of left shoulder: Secondary | ICD-10-CM | POA: Diagnosis not present

## 2019-03-14 MED ORDER — IBANDRONATE SODIUM 150 MG PO TABS: 150.0000 mg | ORAL_TABLET | ORAL | 3 refills | Status: DC

## 2019-03-17 ENCOUNTER — Other Ambulatory Visit: Payer: Self-pay | Admitting: Family Medicine

## 2019-03-17 DIAGNOSIS — I10 Essential (primary) hypertension: Secondary | ICD-10-CM

## 2019-03-21 DIAGNOSIS — M25612 Stiffness of left shoulder, not elsewhere classified: Secondary | ICD-10-CM | POA: Diagnosis not present

## 2019-03-21 DIAGNOSIS — M7502 Adhesive capsulitis of left shoulder: Secondary | ICD-10-CM | POA: Diagnosis not present

## 2019-03-21 DIAGNOSIS — M25511 Pain in right shoulder: Secondary | ICD-10-CM | POA: Diagnosis not present

## 2019-03-22 MED FILL — OLMESARTAN-HCTZ 20-12.5 MG: 20-12.5 | 30 days supply | Qty: 30 | Fill #0

## 2019-03-23 ENCOUNTER — Inpatient Hospital Stay: Payer: Medicare Other | Attending: Hematology

## 2019-03-23 ENCOUNTER — Inpatient Hospital Stay: Payer: Medicare Other

## 2019-03-23 ENCOUNTER — Other Ambulatory Visit: Payer: Self-pay

## 2019-03-23 ENCOUNTER — Other Ambulatory Visit: Payer: Self-pay | Admitting: Hematology

## 2019-03-23 VITALS — BP 124/85 | HR 70 | Temp 98.5°F | Resp 17

## 2019-03-23 DIAGNOSIS — Z95828 Presence of other vascular implants and grafts: Secondary | ICD-10-CM

## 2019-03-23 DIAGNOSIS — Z880 Allergy status to penicillin: Secondary | ICD-10-CM | POA: Insufficient documentation

## 2019-03-23 DIAGNOSIS — Z9221 Personal history of antineoplastic chemotherapy: Secondary | ICD-10-CM | POA: Insufficient documentation

## 2019-03-23 DIAGNOSIS — C778 Secondary and unspecified malignant neoplasm of lymph nodes of multiple regions: Secondary | ICD-10-CM | POA: Diagnosis not present

## 2019-03-23 DIAGNOSIS — Z8052 Family history of malignant neoplasm of bladder: Secondary | ICD-10-CM | POA: Insufficient documentation

## 2019-03-23 DIAGNOSIS — E8881 Metabolic syndrome: Secondary | ICD-10-CM | POA: Diagnosis not present

## 2019-03-23 DIAGNOSIS — Z5112 Encounter for antineoplastic immunotherapy: Secondary | ICD-10-CM | POA: Diagnosis not present

## 2019-03-23 DIAGNOSIS — E039 Hypothyroidism, unspecified: Secondary | ICD-10-CM

## 2019-03-23 DIAGNOSIS — C541 Malignant neoplasm of endometrium: Secondary | ICD-10-CM | POA: Diagnosis not present

## 2019-03-23 DIAGNOSIS — R0600 Dyspnea, unspecified: Secondary | ICD-10-CM | POA: Diagnosis not present

## 2019-03-23 DIAGNOSIS — R946 Abnormal results of thyroid function studies: Secondary | ICD-10-CM | POA: Insufficient documentation

## 2019-03-23 DIAGNOSIS — Z923 Personal history of irradiation: Secondary | ICD-10-CM | POA: Insufficient documentation

## 2019-03-23 DIAGNOSIS — Z79899 Other long term (current) drug therapy: Secondary | ICD-10-CM | POA: Diagnosis not present

## 2019-03-23 DIAGNOSIS — C50211 Malignant neoplasm of upper-inner quadrant of right female breast: Secondary | ICD-10-CM | POA: Insufficient documentation

## 2019-03-23 DIAGNOSIS — Z881 Allergy status to other antibiotic agents status: Secondary | ICD-10-CM | POA: Insufficient documentation

## 2019-03-23 DIAGNOSIS — I10 Essential (primary) hypertension: Secondary | ICD-10-CM | POA: Insufficient documentation

## 2019-03-23 DIAGNOSIS — K760 Fatty (change of) liver, not elsewhere classified: Secondary | ICD-10-CM | POA: Insufficient documentation

## 2019-03-23 DIAGNOSIS — I700 Atherosclerosis of aorta: Secondary | ICD-10-CM | POA: Insufficient documentation

## 2019-03-23 DIAGNOSIS — Z90710 Acquired absence of both cervix and uterus: Secondary | ICD-10-CM | POA: Insufficient documentation

## 2019-03-23 DIAGNOSIS — M8580 Other specified disorders of bone density and structure, unspecified site: Secondary | ICD-10-CM | POA: Diagnosis not present

## 2019-03-23 DIAGNOSIS — M1990 Unspecified osteoarthritis, unspecified site: Secondary | ICD-10-CM | POA: Diagnosis not present

## 2019-03-23 DIAGNOSIS — M25512 Pain in left shoulder: Secondary | ICD-10-CM | POA: Insufficient documentation

## 2019-03-23 DIAGNOSIS — E785 Hyperlipidemia, unspecified: Secondary | ICD-10-CM | POA: Diagnosis not present

## 2019-03-23 DIAGNOSIS — Z79811 Long term (current) use of aromatase inhibitors: Secondary | ICD-10-CM | POA: Diagnosis not present

## 2019-03-23 DIAGNOSIS — R42 Dizziness and giddiness: Secondary | ICD-10-CM | POA: Insufficient documentation

## 2019-03-23 DIAGNOSIS — Z170 Estrogen receptor positive status [ER+]: Secondary | ICD-10-CM | POA: Diagnosis not present

## 2019-03-23 DIAGNOSIS — K5730 Diverticulosis of large intestine without perforation or abscess without bleeding: Secondary | ICD-10-CM | POA: Diagnosis not present

## 2019-03-23 DIAGNOSIS — Z800 Family history of malignant neoplasm of digestive organs: Secondary | ICD-10-CM | POA: Insufficient documentation

## 2019-03-23 DIAGNOSIS — K429 Umbilical hernia without obstruction or gangrene: Secondary | ICD-10-CM | POA: Diagnosis not present

## 2019-03-23 DIAGNOSIS — S022XXA Fracture of nasal bones, initial encounter for closed fracture: Secondary | ICD-10-CM | POA: Insufficient documentation

## 2019-03-23 LAB — CBC WITH DIFFERENTIAL (CANCER CENTER ONLY)
Abs Immature Granulocytes: 0.01 10*3/uL (ref 0.00–0.07)
Basophils Absolute: 0 10*3/uL (ref 0.0–0.1)
Basophils Relative: 1 %
Eosinophils Absolute: 0.1 10*3/uL (ref 0.0–0.5)
Eosinophils Relative: 1 %
HCT: 39.3 % (ref 36.0–46.0)
Hemoglobin: 13.7 g/dL (ref 12.0–15.0)
Immature Granulocytes: 0 %
Lymphocytes Relative: 42 %
Lymphs Abs: 1.5 10*3/uL (ref 0.7–4.0)
MCH: 34.9 pg — ABNORMAL HIGH (ref 26.0–34.0)
MCHC: 34.9 g/dL (ref 30.0–36.0)
MCV: 100 fL (ref 80.0–100.0)
Monocytes Absolute: 0.5 10*3/uL (ref 0.1–1.0)
Monocytes Relative: 15 %
Neutro Abs: 1.4 10*3/uL — ABNORMAL LOW (ref 1.7–7.7)
Neutrophils Relative %: 41 %
Platelet Count: 121 10*3/uL — ABNORMAL LOW (ref 150–400)
RBC: 3.93 MIL/uL (ref 3.87–5.11)
RDW: 12.2 % (ref 11.5–15.5)
WBC Count: 3.5 10*3/uL — ABNORMAL LOW (ref 4.0–10.5)
nRBC: 0 % (ref 0.0–0.2)

## 2019-03-23 LAB — CMP (CANCER CENTER ONLY)
ALT: 97 U/L — ABNORMAL HIGH (ref 0–44)
AST: 59 U/L — ABNORMAL HIGH (ref 15–41)
Albumin: 3.7 g/dL (ref 3.5–5.0)
Alkaline Phosphatase: 46 U/L (ref 38–126)
Anion gap: 7 (ref 5–15)
BUN: 13 mg/dL (ref 8–23)
CO2: 28 mmol/L (ref 22–32)
Calcium: 8.6 mg/dL — ABNORMAL LOW (ref 8.9–10.3)
Chloride: 101 mmol/L (ref 98–111)
Creatinine: 0.78 mg/dL (ref 0.44–1.00)
GFR, Est AFR Am: >60 mL/min (ref >60)
GFR, Estimated: >60 mL/min (ref >60)
Glucose, Bld: 107 mg/dL — ABNORMAL HIGH (ref 70–99)
Potassium: 3.6 mmol/L (ref 3.5–5.1)
Sodium: 136 mmol/L (ref 135–145)
Total Bilirubin: 0.6 mg/dL (ref 0.3–1.2)
Total Protein: 7 g/dL (ref 6.5–8.1)

## 2019-03-23 LAB — TSH: TSH: 1.615 u[IU]/mL (ref 0.308–3.960)

## 2019-03-23 MED ORDER — SODIUM CHLORIDE 0.9 % IV SOLN
Freq: Once | INTRAVENOUS | Status: AC
  Administered 2019-03-23: 12:00:00 via INTRAVENOUS
  Filled 2019-03-23: qty 250

## 2019-03-23 MED ORDER — HEPARIN SOD (PORK) LOCK FLUSH 100 UNIT/ML IV SOLN
500.0000 [IU] | Freq: Once | INTRAVENOUS | Status: AC | PRN
  Administered 2019-03-23: 13:00:00 500 [IU]
  Filled 2019-03-23: qty 5

## 2019-03-23 MED ORDER — SODIUM CHLORIDE 0.9% FLUSH
10.0000 mL | Freq: Once | INTRAVENOUS | Status: AC
  Administered 2019-03-23: 10 mL
  Filled 2019-03-23: qty 10

## 2019-03-23 MED ORDER — SODIUM CHLORIDE 0.9% FLUSH
10.0000 mL | INTRAVENOUS | Status: DC | PRN
  Administered 2019-03-23: 13:00:00 10 mL
  Filled 2019-03-23: qty 10

## 2019-03-23 MED ORDER — SODIUM CHLORIDE 0.9 % IV SOLN
200.0000 mg | Freq: Once | INTRAVENOUS | Status: AC
  Administered 2019-03-23: 200 mg via INTRAVENOUS
  Filled 2019-03-23: qty 8

## 2019-03-23 NOTE — Patient Instructions (Signed)

## 2019-03-23 NOTE — Progress Notes (Signed)
Per [name]: OK to treat with ANC of 1.4 and elevated AST/ALT

## 2019-03-23 NOTE — Patient Instructions (Signed)
[facility] Discharge Instructions for Patients Receiving Chemotherapy  Today you received the following chemotherapy agents: Pembrolizumab (Keytruda)  To help prevent nausea and vomiting after your treatment, we encourage you to take your nausea medication as directed.   If you develop nausea and vomiting that is not controlled by your nausea medication, call the clinic.   BELOW ARE SYMPTOMS THAT SHOULD BE REPORTED IMMEDIATELY:  *FEVER GREATER THAN 100.5 F  *CHILLS WITH OR WITHOUT FEVER  NAUSEA AND VOMITING THAT IS NOT CONTROLLED WITH YOUR NAUSEA MEDICATION  *UNUSUAL SHORTNESS OF BREATH  *UNUSUAL BRUISING OR BLEEDING  TENDERNESS IN MOUTH AND THROAT WITH OR WITHOUT PRESENCE OF ULCERS  *URINARY PROBLEMS  *BOWEL PROBLEMS  UNUSUAL RASH Items with * indicate a potential emergency and should be followed up as soon as possible.  Feel free to call the clinic should you have any questions or concerns. The clinic phone number is [phone].  Please show the CHEMO ALERT CARD at check-in to the Emergency Department and triage nurse.  Coronavirus (COVID-19) Are you at risk?  Are you at risk for the Coronavirus (COVID-19)?  To be considered HIGH RISK for Coronavirus (COVID-19), you have to meet the following criteria:  . Traveled to [place], [place], [name], [place] or [place]; or in the [place] to [place], [place], [place], or [place]; and have fever, cough, and shortness of breath within the last 2 weeks of travel OR . Been in close contact with a person diagnosed with COVID-19 within the last 2 weeks and have fever, cough, and shortness of breath . IF YOU DO NOT MEET THESE CRITERIA, YOU ARE CONSIDERED LOW RISK FOR COVID-19.  What to do if you are HIGH RISK for COVID-19?  . If you are having a medical emergency, call 911. . Seek medical care right away. Before you go to a doctor's office, urgent care or emergency department, call ahead and tell them  about your recent travel, contact with someone diagnosed with COVID-19, and your symptoms. You should receive instructions from your physician's office regarding next steps of care.  . When you arrive at healthcare provider, tell the healthcare staff immediately you have returned from visiting [place], [place], [place], [place] or [name]; or traveled in the [place] to [place], [place], [place], or [place]; in the last two weeks or you have been in close contact with a person diagnosed with COVID-19 in the last 2 weeks.   . Tell the health care staff about your symptoms: fever, cough and shortness of breath. . After you have been seen by a medical provider, you will be either: o Tested for (COVID-19) and discharged home on quarantine except to seek medical care if symptoms worsen, and asked to  - Stay home and avoid contact with others until you get your results (4-5 days)  - Avoid travel on public transportation if possible (such as bus, train, or airplane) or o Sent to the Emergency Department by EMS for evaluation, COVID-19 testing, and possible admission depending on your condition and test results.  What to do if you are LOW RISK for COVID-19?  Reduce your risk of any infection by using the same precautions used for avoiding the common cold or flu:  . Wash your hands often with soap and warm water for at least 20 seconds.  If soap and water are not readily available, use an alcohol-based hand sanitizer with at least 60% alcohol.  . If coughing or   sneezing, cover your mouth and nose by coughing or sneezing into the elbow areas of your shirt or coat, into a tissue or into your sleeve (not your hands). . Avoid shaking hands with others and consider head nods or verbal greetings only. . Avoid touching your eyes, nose, or mouth with unwashed hands.  . Avoid close contact with people who are sick. . Avoid places or events with large numbers of people in one location, like concerts or  sporting events. . Carefully consider travel plans you have or are making. . If you are planning any travel outside or inside the [place], visit the CDC's Travelers' Health webpage for the latest health notices. . If you have some symptoms but not all symptoms, continue to monitor at home and seek medical attention if your symptoms worsen. . If you are having a medical emergency, call 911.   ADDITIONAL HEALTHCARE OPTIONS FOR PATIENTS  [facility] / e-Visit: [email or web address]         [facility]: [phone]   Urgent Care: [phone]                   [facility]: [phone]   

## 2019-03-24 ENCOUNTER — Ambulatory Visit: Payer: Medicare Other

## 2019-03-24 ENCOUNTER — Other Ambulatory Visit: Payer: Medicare Other

## 2019-03-24 DIAGNOSIS — M7502 Adhesive capsulitis of left shoulder: Secondary | ICD-10-CM | POA: Diagnosis not present

## 2019-03-24 DIAGNOSIS — M25612 Stiffness of left shoulder, not elsewhere classified: Secondary | ICD-10-CM | POA: Diagnosis not present

## 2019-03-24 DIAGNOSIS — M25511 Pain in right shoulder: Secondary | ICD-10-CM | POA: Diagnosis not present

## 2019-03-25 ENCOUNTER — Other Ambulatory Visit: Payer: Self-pay

## 2019-03-25 ENCOUNTER — Emergency Department (HOSPITAL_BASED_OUTPATIENT_CLINIC_OR_DEPARTMENT_OTHER): Payer: Medicare Other

## 2019-03-25 ENCOUNTER — Emergency Department (HOSPITAL_BASED_OUTPATIENT_CLINIC_OR_DEPARTMENT_OTHER)
Admission: EM | Admit: 2019-03-25 | Discharge: 2019-03-25 | Disposition: A | Discharge status: Home / Self Care | Payer: Medicare Other | Attending: Emergency Medicine | Admitting: Emergency Medicine

## 2019-03-25 ENCOUNTER — Encounter (HOSPITAL_BASED_OUTPATIENT_CLINIC_OR_DEPARTMENT_OTHER): Payer: Self-pay | Admitting: *Deleted

## 2019-03-25 DIAGNOSIS — Z79899 Other long term (current) drug therapy: Secondary | ICD-10-CM | POA: Insufficient documentation

## 2019-03-25 DIAGNOSIS — Y9301 Activity, walking, marching and hiking: Secondary | ICD-10-CM | POA: Insufficient documentation

## 2019-03-25 DIAGNOSIS — Z23 Encounter for immunization: Secondary | ICD-10-CM | POA: Diagnosis not present

## 2019-03-25 DIAGNOSIS — Z8541 Personal history of malignant neoplasm of cervix uteri: Secondary | ICD-10-CM | POA: Insufficient documentation

## 2019-03-25 DIAGNOSIS — Y92830 Public park as the place of occurrence of the external cause: Secondary | ICD-10-CM | POA: Diagnosis not present

## 2019-03-25 DIAGNOSIS — S022XXA Fracture of nasal bones, initial encounter for closed fracture: Secondary | ICD-10-CM | POA: Insufficient documentation

## 2019-03-25 DIAGNOSIS — S0990XA Unspecified injury of head, initial encounter: Secondary | ICD-10-CM | POA: Diagnosis not present

## 2019-03-25 DIAGNOSIS — Z85820 Personal history of malignant melanoma of skin: Secondary | ICD-10-CM | POA: Insufficient documentation

## 2019-03-25 DIAGNOSIS — Y998 Other external cause status: Secondary | ICD-10-CM | POA: Insufficient documentation

## 2019-03-25 DIAGNOSIS — W010XXA Fall on same level from slipping, tripping and stumbling without subsequent striking against object, initial encounter: Secondary | ICD-10-CM | POA: Insufficient documentation

## 2019-03-25 DIAGNOSIS — I10 Essential (primary) hypertension: Secondary | ICD-10-CM | POA: Insufficient documentation

## 2019-03-25 DIAGNOSIS — S199XXA Unspecified injury of neck, initial encounter: Secondary | ICD-10-CM | POA: Diagnosis not present

## 2019-03-25 DIAGNOSIS — R220 Localized swelling, mass and lump, head: Secondary | ICD-10-CM | POA: Diagnosis not present

## 2019-03-25 DIAGNOSIS — W19XXXA Unspecified fall, initial encounter: Secondary | ICD-10-CM

## 2019-03-25 MED ORDER — ACETAMINOPHEN 325 MG PO TABS
650.0000 mg | ORAL_TABLET | Freq: Once | ORAL | Status: AC
  Administered 2019-03-25: 650 mg via ORAL
  Filled 2019-03-25: qty 2

## 2019-03-25 MED ORDER — TETANUS-DIPHTH-ACELL PERTUSSIS 5-2.5-18.5 LF-MCG/0.5 IM SUSP
0.5000 mL | Freq: Once | INTRAMUSCULAR | Status: AC
  Administered 2019-03-25: 0.5 mL via INTRAMUSCULAR
  Filled 2019-03-25: qty 0.5

## 2019-03-25 NOTE — Discharge Instructions (Signed)
Thank you for allowing me to care for you today in the Emergency Department.   Your tetanus immunization was updated today.  Your CT only showed a right nasal bone fracture.  You may be more prone to nosebleeds for the next few weeks due to your fracture.  If you start having a nosebleed at home, lean forward and pinch your nostrils together and hold pressure for at least 10 minutes.  This should stop most nosebleeds.  If you are unable to get it to stop with this procedure, you can insert 1 squirt of Afrin nasal spray, which is available over-the-counter, into the nostril that is bleeding, and then lean forward and apply pressure for 10 minutes again.  Avoid blowing your nose for the next few weeks while you are injuries are healing.  This may make you more prone to nosebleeds and/or worsening swelling around the nose and face.  Apply ice packs for 10 to 15 minutes as frequently as needed to help with pain and swelling.  You can take Tylenol for pain control.  Most nasal bone fractures will heal on their own.  You can follow-up with ear nose and throat if you are displeased with the cosmetic appearance of your nose once the swelling goes down in a few weeks.  Return to the emergency department if you have any fall or injury, if you have a nosebleed that you are unable to stop using the above technique, if you have new numbness or weakness, significant changes in your vision, slurred speech, or other new, concerning symptoms.

## 2019-03-25 NOTE — ED Triage Notes (Signed)
She was on a walking trail. She tripped over a tree root and fell. Injury to her nose with deformity, swelling and abrasions.

## 2019-03-25 NOTE — ED Provider Notes (Signed)
[facility] EMERGENCY DEPARTMENT Provider Note   CSN: [id number] Arrival date & time: 03/25/19  1227    History   Chief Complaint Chief Complaint  Patient presents with   Fall    HPI Dawn [name] is a 68 y.o. female with a history of endometrial cancer, GERD, arthritis, and anemia who presents to the emergency department with a chief complaint of fall.  The patient reports that she was out walking in the park earlier today when she tripped over a tree root and fell forward.  She reports that she hit her face on the ground and has abrasions to her bilateral knees.  No syncope, nausea, or vomiting.  She has been having a headache and pain to her nose since the fall.  Pain is constant. she reports an associated nosebleed, which is since resolved.  She was able to get up independently and has been ambulatory since the fall.  She denies chest pain, abdominal pain, shortness of breath, visual changes, dizziness, lightheadedness, neck pain, back pain, numbness, or weakness.  She does not take any blood thinners.  No other treatment prior to arrival.  She is unsure when her Tdap was last updated.     The history is provided by the patient. No language interpreter was used.    Past Medical History:  Diagnosis Date   Anemia    as a child.   Arthritis    Bilateral cataracts    Bilateral leg cramps    Cancer ([place])    skin   Dyspnea    Family history of bladder cancer    Family history of colon cancer in father    Vira Agar' corneal dystrophy    GERD (gastroesophageal reflux disease)    Hearing loss    Right side 30%   History of hiatal hernia    small size   History of radiation therapy 08/21/17, 08/28/17,09/01/17, 09/11/17, 09/17/17   Vaginal cuff brachytherapy.    History of radiation therapy 11/11/17- 12/08/17   Right Breast treated to 40.05 Gy with 15 fx of 2.67 Gy and a boost of 10 Gy with 5 fx.    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   NAFL (nonalcoholic fatty liver)    Obesity    PONV (postoperative nausea and vomiting)    Tuberculosis    tested positive, mother had when patient was child   Uterine cancer ([place]) 04/2017   endometrial cancer   Varices, gastric    Vertigo     Patient Active Problem List   Diagnosis Date Noted   Aortic atherosclerosis ([place]) 11/19/2017   Port-A-Cath in place 07/22/2017   Endometrial cancer ([place]) 06/09/2017   Genetic testing 05/19/2017   Family history of colon cancer in father 05/07/2017   Family history of bladder cancer 05/07/2017   Endometrial adenocarcinoma ([place]) 05/07/2017   Malignant neoplasm of upper-inner quadrant of right breast in female, estrogen receptor positive ([place]) 05/01/2017   Osteoporosis 05/24/2015   IFG (impaired fasting glucose) 12/02/2013   Meniere disease 01/31/2011   SEBORRHEIC KERATOSIS 07/10/2010   MUSCLE SPASM, TRAPEZIUS 07/10/2010   OSTEOARTHRITIS, 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   POSTMENOPAUSAL STATUS 03/08/2010   INTERNAL HEMORRHOIDS 07/26/2009   EROSIVE ESOPHAGITIS 07/26/2009   DIVERTICULOSIS, COLON 07/26/2009   Fatty liver 07/26/2009   COLONIC POLYPS, HYPERPLASTIC, HX OF 07/26/2009   Transaminitis 01/31/2009  HYPERTENSION, BENIGN 01/16/2009   Hyperlipidemia 04/22/2007   LOSS, HEARING NOS 04/22/2007    Past Surgical History:  Procedure Laterality Date   BREAST BIOPSY Right 04/27/2017   BREAST LUMPECTOMY WITH RADIOACTIVE SEED AND SENTINEL LYMPH NODE BIOPSY Right 05/25/2017   Procedure: RIGHT BREAST LUMPECTOMY WITH RADIOACTIVE SEED AND  RIGHT AXILLARY SENTINEL LYMPH NODE BIOPSY;  Surgeon: Excell Seltzer, MD;  Location: [facility];  Service: General;  Laterality: Right;   CATARACT EXTRACTION, BILATERAL     COLONOSCOPY     HYSTEROSCOPY W/D&C N/A 04/29/2017   Procedure: DILATATION AND  CURETTAGE /HYSTEROSCOPY;  Surgeon: Linda Hedges, DO;  Location: [facility];  Service: Gynecology;  Laterality: N/A;   PORTACATH PLACEMENT Left 05/25/2017   Procedure: INSERTION PORT-A-CATH;  Surgeon: Excell Seltzer, MD;  Location: [facility];  Service: General;  Laterality: Left;   ROBOTIC ASSISTED TOTAL HYSTERECTOMY WITH BILATERAL SALPINGO OOPHERECTOMY N/A 06/09/2017   Procedure: XI ROBOTIC ASSISTED TOTAL LAPOROSCOPIC HYSTERECTOMY WITH BILATERAL SALPINGO OOPHORECTOMY;  Surgeon: Everitt Amber, MD;  Location: [facility];  Service: Gynecology;  Laterality: N/A;   SENTINEL NODE BIOPSY N/A 06/09/2017   Procedure: SENTINEL NODE BIOPSY;  Surgeon: Everitt Amber, MD;  Location: [facility];  Service: Gynecology;  Laterality: N/A;     OB History   No obstetric history on file.      Home Medications    Prior to Admission medications   Medication Sig Start Date End Date Taking? Authorizing Provider  acetaminophen (TYLENOL) 500 MG tablet Take 500 mg by mouth every 6 (six) hours as needed for moderate pain.    [provider]  anastrozole (ARIMIDEX) 1 MG tablet Take 1 tablet (1 mg total) by mouth daily. 01/20/19   Truitt Merle, MD  Ascorbic Acid (VITAMIN C) 1000 MG tablet Take 1,000 mg by mouth daily.    [provider]  b complex vitamins capsule Take 1 capsule by mouth daily.    [provider]  Calcium Carbonate-Vitamin D3 (CALCIUM 600-D) 600-400 MG-UNIT TABS Take 1-2 tablets by mouth daily.    [provider]  diphenhydrAMINE (BENADRYL) 25 MG tablet Take 25-50 mg by mouth every 6 (six) hours as needed for sleep.    [provider]  ibandronate (BONIVA) 150 MG tablet Take 1 tablet (150 mg total) by mouth every 30 (thirty) days. 03/14/19   [name], MD  ibuprofen (ADVIL,MOTRIN) 200 MG tablet Take 200 mg by mouth every 6 (six) hours as needed.    [provider]  levothyroxine (SYNTHROID) 50 MCG tablet Take 1 tablet (50 mcg total) by  mouth daily. 03/09/19   [name], MD  lidocaine-prilocaine (EMLA) cream APPLY TO AFFECTED AREA ONCE 02/23/19   Truitt Merle, MD  Multiple Vitamin (MULTIVITAMIN) capsule Take 1 capsule by mouth daily.      [provider]  olmesartan-hydrochlorothiazide (BENICAR HCT) 20-12.5 MG tablet Take 1 tablet by mouth daily. 01/04/19   [name], MD  Pitavastatin Calcium (LIVALO) 2 MG TABS Take 1.5 tablets (3 mg total) by mouth every evening. Takes 1.5 tablet 02/19/18   [name], MD  prednisoLONE acetate (PRED FORTE) 1 % ophthalmic suspension Place 1 drop into the left eye 3 (three) times daily. 01/05/18   [provider]  sodium chloride (MURO 128) 5 % ophthalmic solution Place 2 drops into both eyes daily.    [provider]  tiZANidine (ZANAFLEX) 2 MG tablet Take 1 tablet (2 mg total) by mouth at bedtime as needed  for muscle spasms. 01/04/19   [name], MD  triamcinolone cream (KENALOG) 0.1 % APPLY TO ITCHY SPOTS ONCE DAILY 06/04/18   [provider]    Family History Family History  Problem Relation Age of Onset   Colon cancer Father 64   Heart attack Father 77   Prostate cancer Father        dx 95's   Bladder Cancer Father 64   Stroke Mother 64   Diabetes Maternal Grandfather    Kidney disease Maternal Grandfather    Asthma Brother    Cancer Paternal Grandmother 67       GYN cancer ( thinks ovarian, maybe uterine)   Heart attack Maternal Grandmother 70   Breast cancer Other 17   Colon cancer Other 27   Cancer Other        type unk, age dx unk    Social History Social History   Tobacco Use   Smoking status: Never Smoker   Smokeless tobacco: Never Used  Substance Use Topics   Alcohol use: Yes    Alcohol/week: 0.0 standard drinks    Comment: <1/week wine or beer occasional   Drug use: No     Allergies   Ciprofloxacin, Crestor [rosuvastatin calcium], Fosamax [alendronate sodium],  Penicillins, and Taxol [paclitaxel]   Review of Systems Review of Systems  Constitutional: Negative for activity change, chills and fever.  HENT: Positive for facial swelling and nosebleeds. Negative for ear pain, postnasal drip and trouble swallowing.   Eyes: Negative for visual disturbance.  Respiratory: Negative for shortness of breath and wheezing.   Cardiovascular: Negative for chest pain and palpitations.  Gastrointestinal: Negative for abdominal pain, diarrhea, nausea and vomiting.  Genitourinary: Negative for dysuria.  Musculoskeletal: Negative for arthralgias, back pain, myalgias, neck pain and neck stiffness.  Skin: Negative for rash.  Allergic/Immunologic: Negative for immunocompromised state.  Neurological: Positive for headaches.  Psychiatric/Behavioral: Negative for confusion.     Physical Exam Updated Vital Signs BP 129/82 (BP Location: Left Arm)    Pulse 72    Temp 97.9 F (36.6 C) (Oral)    Resp 16    Ht 5\' 3"  (1.6 m)    Wt 86.2 kg    SpO2 98%    BMI 33.66 kg/m   Physical Exam Vitals signs and nursing note reviewed.  Constitutional:      General: She is not in acute distress.    Comments: No battle sign.   HENT:     Head: Normocephalic. No Battle's sign.     Comments: Tender to palpation to the nares.  Otherwise, no focal facial tenderness.  No loose teeth.  No lacerations.    Nose: Congestion present.     Right Nostril: No septal hematoma.     Left Nostril: No septal hematoma.     Comments: Obvious swelling and bruising to the bridge of the nose.  No septal hematoma. Eyes:     Extraocular Movements: Extraocular movements intact.     Conjunctiva/sclera: Conjunctivae normal.     Pupils: Pupils are equal, round, and reactive to light.  Neck:     Musculoskeletal: Neck supple.  Cardiovascular:     Rate and Rhythm: Normal rate and regular rhythm.     Heart sounds: No murmur. No friction rub. No gallop.   Pulmonary:     Effort: Pulmonary effort is normal. No  respiratory distress.     Breath sounds: No stridor. No wheezing, rhonchi or rales.  Chest:  Chest wall: No tenderness.  Abdominal:     General: There is no distension.     Palpations: Abdomen is soft. There is no mass.     Tenderness: There is no abdominal tenderness. There is no right CVA tenderness, left CVA tenderness, guarding or rebound.     Hernia: No hernia is present.  Musculoskeletal:     Comments: No tenderness to the cervical, thoracic, or lumbar spinous processes or bilateral paraspinal muscles.   Mild swelling to the bilateral knees and overlying abrasions.  She is able to stand and is ambulatory.  No focal tenderness to the bilateral lower extremities.  She is neurovascularly intact throughout.  Skin:    General: Skin is warm.     Findings: No rash.  Neurological:     Mental Status: She is alert.  Psychiatric:        Behavior: Behavior normal.      ED Treatments / Results  Labs (all labs ordered are listed, but only abnormal results are displayed) Labs Reviewed - No data to display  EKG None  Radiology Dg Nasal Bones  Result Date: 03/25/2019 CLINICAL DATA:  Nasal pain, deformity and abrasions following a fall. EXAM: NASAL BONES - 3+ VIEW COMPARISON:  None. FINDINGS: Mildly comminuted fracture of the mid to distal nasal bone with mild inferior angulation of the distal fragments and less than 1 mm of inferior displacement of 2 distal fragments. IMPRESSION: Mildly comminuted nasal bone fracture. Electronically Signed   By: Claudie Revering M.D.   On: 03/25/2019 13:18   Ct Head Wo Contrast  Result Date: 03/25/2019 CLINICAL DATA:  Fall, nasal deformity, swelling and brazier and. History of RIGHT breast cancer. EXAM: CT HEAD WITHOUT CONTRAST CT MAXILLOFACIAL WITHOUT CONTRAST CT CERVICAL SPINE WITHOUT CONTRAST TECHNIQUE: Multidetector CT imaging of the head, cervical spine, and maxillofacial structures were performed using the standard protocol without intravenous  contrast. Multiplanar CT image reconstructions of the cervical spine and maxillofacial structures were also generated. COMPARISON:  Head CT dated 04/03/2017. FINDINGS: CT HEAD FINDINGS Brain: Ventricles are normal in size and configuration. There is no mass, hemorrhage, edema or other evidence of acute parenchymal abnormality. No extra-axial hemorrhage. Vascular: Chronic calcified atherosclerotic changes of the large vessels at the skull base. No unexpected hyperdense vessel. Skull: No skull fracture. Other: None. CT MAXILLOFACIAL FINDINGS Osseous: Lower frontal bones are intact and normally aligned. Displaced/comminuted fracture of the RIGHT nasal bone. Osseous structures about the orbits are intact and normally aligned bilaterally. Walls of the maxillary sinuses are intact bilaterally. Bilateral zygomatic arches and pterygoid plates are intact. No mandible fracture or displacement seen. Orbits: Negative. No traumatic or inflammatory finding. Sinuses: Clear. Soft tissues: Soft tissue edema about the nasal bones. No circumscribed soft tissue hematoma identified. CT CERVICAL SPINE FINDINGS Alignment: Mild scoliosis which may be accentuated by patient positioning. No evidence of acute vertebral body subluxation. Facet joints are normally aligned throughout. Skull base and vertebrae: No acute fracture. No primary bone lesion or focal pathologic process. Soft tissues and spinal canal: No prevertebral fluid or swelling. No visible canal hematoma. Disc levels: Disc spaces are well maintained throughout. No significant disc bulge or central canal stenosis at any level. Upper chest: No acute findings. Other: None. IMPRESSION: 1. No acute intracranial abnormality. No intracranial mass, hemorrhage or edema. No skull fracture. 2. Displaced/comminuted fracture of the RIGHT nasal bone. Associated soft tissue edema about the nasal bones. 3. No acute fracture or subluxation within the cervical spine. Electronically Signed  By:  Franki Cabot M.D.   On: 03/25/2019 16:05   Ct Cervical Spine Wo Contrast  Result Date: 03/25/2019 CLINICAL DATA:  Fall, nasal deformity, swelling and brazier and. History of RIGHT breast cancer. EXAM: CT HEAD WITHOUT CONTRAST CT MAXILLOFACIAL WITHOUT CONTRAST CT CERVICAL SPINE WITHOUT CONTRAST TECHNIQUE: Multidetector CT imaging of the head, cervical spine, and maxillofacial structures were performed using the standard protocol without intravenous contrast. Multiplanar CT image reconstructions of the cervical spine and maxillofacial structures were also generated. COMPARISON:  Head CT dated 04/03/2017. FINDINGS: CT HEAD FINDINGS Brain: Ventricles are normal in size and configuration. There is no mass, hemorrhage, edema or other evidence of acute parenchymal abnormality. No extra-axial hemorrhage. Vascular: Chronic calcified atherosclerotic changes of the large vessels at the skull base. No unexpected hyperdense vessel. Skull: No skull fracture. Other: None. CT MAXILLOFACIAL FINDINGS Osseous: Lower frontal bones are intact and normally aligned. Displaced/comminuted fracture of the RIGHT nasal bone. Osseous structures about the orbits are intact and normally aligned bilaterally. Walls of the maxillary sinuses are intact bilaterally. Bilateral zygomatic arches and pterygoid plates are intact. No mandible fracture or displacement seen. Orbits: Negative. No traumatic or inflammatory finding. Sinuses: Clear. Soft tissues: Soft tissue edema about the nasal bones. No circumscribed soft tissue hematoma identified. CT CERVICAL SPINE FINDINGS Alignment: Mild scoliosis which may be accentuated by patient positioning. No evidence of acute vertebral body subluxation. Facet joints are normally aligned throughout. Skull base and vertebrae: No acute fracture. No primary bone lesion or focal pathologic process. Soft tissues and spinal canal: No prevertebral fluid or swelling. No visible canal hematoma. Disc levels: Disc spaces  are well maintained throughout. No significant disc bulge or central canal stenosis at any level. Upper chest: No acute findings. Other: None. IMPRESSION: 1. No acute intracranial abnormality. No intracranial mass, hemorrhage or edema. No skull fracture. 2. Displaced/comminuted fracture of the RIGHT nasal bone. Associated soft tissue edema about the nasal bones. 3. No acute fracture or subluxation within the cervical spine. Electronically Signed   By: Franki Cabot M.D.   On: 03/25/2019 16:05   Ct Maxillofacial Wo Contrast  Result Date: 03/25/2019 CLINICAL DATA:  Fall, nasal deformity, swelling and brazier and. History of RIGHT breast cancer. EXAM: CT HEAD WITHOUT CONTRAST CT MAXILLOFACIAL WITHOUT CONTRAST CT CERVICAL SPINE WITHOUT CONTRAST TECHNIQUE: Multidetector CT imaging of the head, cervical spine, and maxillofacial structures were performed using the standard protocol without intravenous contrast. Multiplanar CT image reconstructions of the cervical spine and maxillofacial structures were also generated. COMPARISON:  Head CT dated 04/03/2017. FINDINGS: CT HEAD FINDINGS Brain: Ventricles are normal in size and configuration. There is no mass, hemorrhage, edema or other evidence of acute parenchymal abnormality. No extra-axial hemorrhage. Vascular: Chronic calcified atherosclerotic changes of the large vessels at the skull base. No unexpected hyperdense vessel. Skull: No skull fracture. Other: None. CT MAXILLOFACIAL FINDINGS Osseous: Lower frontal bones are intact and normally aligned. Displaced/comminuted fracture of the RIGHT nasal bone. Osseous structures about the orbits are intact and normally aligned bilaterally. Walls of the maxillary sinuses are intact bilaterally. Bilateral zygomatic arches and pterygoid plates are intact. No mandible fracture or displacement seen. Orbits: Negative. No traumatic or inflammatory finding. Sinuses: Clear. Soft tissues: Soft tissue edema about the nasal bones. No  circumscribed soft tissue hematoma identified. CT CERVICAL SPINE FINDINGS Alignment: Mild scoliosis which may be accentuated by patient positioning. No evidence of acute vertebral body subluxation. Facet joints are normally aligned throughout. Skull base and vertebrae: No acute fracture. No  primary bone lesion or focal pathologic process. Soft tissues and spinal canal: No prevertebral fluid or swelling. No visible canal hematoma. Disc levels: Disc spaces are well maintained throughout. No significant disc bulge or central canal stenosis at any level. Upper chest: No acute findings. Other: None. IMPRESSION: 1. No acute intracranial abnormality. No intracranial mass, hemorrhage or edema. No skull fracture. 2. Displaced/comminuted fracture of the RIGHT nasal bone. Associated soft tissue edema about the nasal bones. 3. No acute fracture or subluxation within the cervical spine. Electronically Signed   By: Franki Cabot M.D.   On: 03/25/2019 16:05    Procedures Procedures (including critical care time)  Medications Ordered in ED Medications  Tdap (BOOSTRIX) injection 0.5 mL (0.5 mLs Intramuscular Given 03/25/19 1603)  acetaminophen (TYLENOL) tablet 650 mg (650 mg Oral Given 03/25/19 1602)     Initial Impression / Assessment and Plan / ED Course  I have reviewed the triage vital signs and the nursing notes.  Pertinent labs & imaging results that were available during my care of the patient were reviewed by me and considered in my medical decision making (see chart for details).        68 year old female with a history of endometrial cancer, GERD, arthritis, and anemia presenting to the ER after a mechanical fall earlier today.  She has obvious trauma to the nose.  X-ray of the nasal bones ordered by triage staff with a mildly comminuted nasal bone fracture.  Given the patient's age and risk factors, will order CT head, maxillofacial, and cervical spine.  Tdap has been updated.  She has been given  Tylenol for pain.  CT head with right nasal bone fracture.  Imaging is otherwise unremarkable.  Wound care provided to the wound over the bridge of the nose.  Home wound care instructions given.  She was given epistaxis precautions and was advised that if she is displeased with the cosmetic appearance of the nose in several weeks that she can follow-up with ENT.   She is hemodynamically stable and in no acute distress.  Safe for discharge to home with outpatient follow-up.  Final Clinical Impressions(s) / ED Diagnoses   Final diagnoses:  Fall from standing, initial encounter  Closed fracture of nasal bone, initial encounter    ED Discharge Orders    None       Joanne Gavel, PA-C 03/25/19 1648    Hayden Rasmussen, MD 03/26/19 0900

## 2019-03-25 NOTE — ED Notes (Signed)
Patient transported to CT 

## 2019-03-25 NOTE — ED Notes (Signed)
ED Provider at bedside. 

## 2019-03-25 NOTE — ED Notes (Signed)
ED Provider at bedside discussing test results and dispo plan of care. 

## 2019-03-28 DIAGNOSIS — M25612 Stiffness of left shoulder, not elsewhere classified: Secondary | ICD-10-CM | POA: Diagnosis not present

## 2019-03-28 DIAGNOSIS — M7502 Adhesive capsulitis of left shoulder: Secondary | ICD-10-CM | POA: Diagnosis not present

## 2019-03-28 DIAGNOSIS — M25511 Pain in right shoulder: Secondary | ICD-10-CM | POA: Diagnosis not present

## 2019-03-31 DIAGNOSIS — M7502 Adhesive capsulitis of left shoulder: Secondary | ICD-10-CM | POA: Diagnosis not present

## 2019-03-31 DIAGNOSIS — M25612 Stiffness of left shoulder, not elsewhere classified: Secondary | ICD-10-CM | POA: Diagnosis not present

## 2019-03-31 DIAGNOSIS — M25511 Pain in right shoulder: Secondary | ICD-10-CM | POA: Diagnosis not present

## 2019-04-05 DIAGNOSIS — M25612 Stiffness of left shoulder, not elsewhere classified: Secondary | ICD-10-CM | POA: Diagnosis not present

## 2019-04-05 DIAGNOSIS — M25511 Pain in right shoulder: Secondary | ICD-10-CM | POA: Diagnosis not present

## 2019-04-05 DIAGNOSIS — M7502 Adhesive capsulitis of left shoulder: Secondary | ICD-10-CM | POA: Diagnosis not present

## 2019-04-08 DIAGNOSIS — M25511 Pain in right shoulder: Secondary | ICD-10-CM | POA: Diagnosis not present

## 2019-04-08 DIAGNOSIS — M7502 Adhesive capsulitis of left shoulder: Secondary | ICD-10-CM | POA: Diagnosis not present

## 2019-04-08 DIAGNOSIS — M25612 Stiffness of left shoulder, not elsewhere classified: Secondary | ICD-10-CM | POA: Diagnosis not present

## 2019-04-11 ENCOUNTER — Ambulatory Visit (HOSPITAL_COMMUNITY)
Admission: RE | Admit: 2019-04-11 | Discharge: 2019-04-11 | Disposition: A | Discharge status: Home / Self Care | Payer: Medicare Other | Source: Ambulatory Visit | Attending: Hematology | Admitting: Hematology

## 2019-04-11 ENCOUNTER — Inpatient Hospital Stay: Payer: Medicare Other

## 2019-04-11 ENCOUNTER — Other Ambulatory Visit: Payer: Self-pay

## 2019-04-11 ENCOUNTER — Encounter (HOSPITAL_COMMUNITY): Payer: Self-pay

## 2019-04-11 DIAGNOSIS — C50211 Malignant neoplasm of upper-inner quadrant of right female breast: Secondary | ICD-10-CM

## 2019-04-11 DIAGNOSIS — Z170 Estrogen receptor positive status [ER+]: Secondary | ICD-10-CM

## 2019-04-11 DIAGNOSIS — C541 Malignant neoplasm of endometrium: Secondary | ICD-10-CM | POA: Diagnosis not present

## 2019-04-11 DIAGNOSIS — C778 Secondary and unspecified malignant neoplasm of lymph nodes of multiple regions: Secondary | ICD-10-CM | POA: Diagnosis not present

## 2019-04-11 DIAGNOSIS — I10 Essential (primary) hypertension: Secondary | ICD-10-CM | POA: Diagnosis not present

## 2019-04-11 DIAGNOSIS — Z95828 Presence of other vascular implants and grafts: Secondary | ICD-10-CM

## 2019-04-11 DIAGNOSIS — Z5111 Encounter for antineoplastic chemotherapy: Secondary | ICD-10-CM | POA: Diagnosis not present

## 2019-04-11 DIAGNOSIS — Z5112 Encounter for antineoplastic immunotherapy: Secondary | ICD-10-CM | POA: Diagnosis not present

## 2019-04-11 DIAGNOSIS — K5730 Diverticulosis of large intestine without perforation or abscess without bleeding: Secondary | ICD-10-CM | POA: Diagnosis not present

## 2019-04-11 LAB — CBC WITH DIFFERENTIAL (CANCER CENTER ONLY)
Abs Immature Granulocytes: 0 10*3/uL (ref 0.00–0.07)
Basophils Absolute: 0 10*3/uL (ref 0.0–0.1)
Basophils Relative: 0 %
Eosinophils Absolute: 0.1 10*3/uL (ref 0.0–0.5)
Eosinophils Relative: 2 %
HCT: 40.1 % (ref 36.0–46.0)
Hemoglobin: 14.1 g/dL (ref 12.0–15.0)
Immature Granulocytes: 0 %
Lymphocytes Relative: 46 %
Lymphs Abs: 2.1 10*3/uL (ref 0.7–4.0)
MCH: 34.6 pg — ABNORMAL HIGH (ref 26.0–34.0)
MCHC: 35.2 g/dL (ref 30.0–36.0)
MCV: 98.5 fL (ref 80.0–100.0)
Monocytes Absolute: 0.5 10*3/uL (ref 0.1–1.0)
Monocytes Relative: 10 %
Neutro Abs: 2 10*3/uL (ref 1.7–7.7)
Neutrophils Relative %: 42 %
Platelet Count: 158 10*3/uL (ref 150–400)
RBC: 4.07 MIL/uL (ref 3.87–5.11)
RDW: 12.1 % (ref 11.5–15.5)
WBC Count: 4.7 10*3/uL (ref 4.0–10.5)
nRBC: 0 % (ref 0.0–0.2)

## 2019-04-11 LAB — CMP (CANCER CENTER ONLY)
ALT: 92 U/L — ABNORMAL HIGH (ref 0–44)
AST: 56 U/L — ABNORMAL HIGH (ref 15–41)
Albumin: 3.9 g/dL (ref 3.5–5.0)
Alkaline Phosphatase: 44 U/L (ref 38–126)
Anion gap: 10 (ref 5–15)
BUN: 10 mg/dL (ref 8–23)
CO2: 28 mmol/L (ref 22–32)
Calcium: 9.3 mg/dL (ref 8.9–10.3)
Chloride: 98 mmol/L (ref 98–111)
Creatinine: 0.78 mg/dL (ref 0.44–1.00)
GFR, Est AFR Am: >60 mL/min (ref >60)
GFR, Estimated: >60 mL/min (ref >60)
Glucose, Bld: 104 mg/dL — ABNORMAL HIGH (ref 70–99)
Potassium: 3.6 mmol/L (ref 3.5–5.1)
Sodium: 136 mmol/L (ref 135–145)
Total Bilirubin: 0.8 mg/dL (ref 0.3–1.2)
Total Protein: 7.3 g/dL (ref 6.5–8.1)

## 2019-04-11 MED ORDER — IOHEXOL 300 MG/ML  SOLN
100.0000 mL | Freq: Once | INTRAMUSCULAR | Status: AC | PRN
  Administered 2019-04-11: 10:00:00 100 mL via INTRAVENOUS

## 2019-04-11 MED ORDER — SODIUM CHLORIDE 0.9% FLUSH
10.0000 mL | INTRAVENOUS | Status: DC | PRN
  Administered 2019-04-11: 10 mL via INTRAVENOUS
  Filled 2019-04-11: qty 10

## 2019-04-11 MED ORDER — HEPARIN SOD (PORK) LOCK FLUSH 100 UNIT/ML IV SOLN
500.0000 [IU] | Freq: Once | INTRAVENOUS | Status: AC
  Administered 2019-04-11: 10:00:00 500 [IU] via INTRAVENOUS

## 2019-04-11 MED ORDER — HEPARIN SOD (PORK) LOCK FLUSH 100 UNIT/ML IV SOLN
INTRAVENOUS | Status: AC
  Filled 2019-04-11: qty 5

## 2019-04-11 MED ORDER — SODIUM CHLORIDE (PF) 0.9 % IJ SOLN
INTRAMUSCULAR | Status: AC
  Filled 2019-04-11: qty 50

## 2019-04-11 NOTE — Progress Notes (Signed)
[place]   Telephone:([phone]) [phone] Fax:([phone]) [phone]   Clinic Follow up Note   Patient Care Team: [name], MD as PCP - Haskell Riling, MD as Consulting Physician (General Surgery) Truitt Merle, MD as Consulting Physician (Hematology) [name], MD as Attending Physician (Radiation Oncology)  Date of Service:  04/13/2019  CHIEF COMPLAINT: F/u on breast and endometrial cancers  SUMMARY OF ONCOLOGIC HISTORY: Oncology History Overview Note  MSI high Cancer Staging Endometrial cancer [facility]) Staging form: Corpus Uteri - Adenosarcoma, AJCC 8th Edition - Pathologic stage from 06/09/2017: Stage IIIC (pT1a, pN1, cM0) - Signed by Truitt Merle, MD on 06/16/2017  Malignant neoplasm of upper-inner quadrant of right breast in female, estrogen receptor positive ([place]) Staging form: Breast, AJCC 8th Edition - Clinical stage from 05/06/2017: Stage IA (cT1c, cN0, cM0, G2, ER: Positive, PR: Negative, HER2: Positive) - Unsigned - Pathologic stage from 05/25/2017: Stage IIA (pT2, pN0, cM0, G2, ER: Positive, PR: Negative, HER2: Negative) - Signed by Truitt Merle, MD on 06/16/2017     Malignant neoplasm of upper-inner quadrant of right breast in female, estrogen receptor positive ([place])  04/27/2017 Initial Biopsy   Diagnosis Breast, right, needle core biopsy INVASIVE DUCTAL CARCINOMA, GRADE 2 Microscopic Comment The neoplasm has intracellular mucin and signet ring cell features. Immunostains shows these cells are positive for ER,GATA3, ck7 and GCDFP, negative for ck20, cdx2, TTF-1 and pax8, The immunostaining pattern supports the neoplasm is breast primary.   04/27/2017 Receptors her2   Estrogen Receptor: 70%, POSITIVE, MODERATE STAINING INTENSITY Progesterone Receptor: 0%, NEGATIVE Proliferation Marker Ki67: 30% HER2 - **POSITIVE** RATIO OF HER2/CEP17 SIGNALS 2.91 AVERAGE HER2 COPY NUMBER PER CELL 7.28   04/27/2017 Initial Diagnosis   Malignant neoplasm of  upper-inner quadrant of right breast in female, estrogen receptor positive ([place])   05/07/2017 Imaging   CT Chest W Contrast 05/07/17 IMPRESSION: Tiny well-defined fatty lesion on the pleura at the right lung base. The thinner slice collimation used for today's chest CT eliminates volume-averaging seen in the lesion on the prior exam and confirms that this is a diffusely fatty nodule. This is a benign finding and likely represents a tiny lipoma. No defect in the hemidiaphragm evident to suggest tiny diaphragmatic hernia. Pulmonary hamartoma a consideration although the lack of soft tissue components makes this less likely.   05/15/2017 Genetic Testing   Patient had genetic testing due to a personal history of breast cancer and uterine cancer as well as a family history of cancer. The Multi-Cancer panel was ordered. The Multi-Cancer Panel offered by [facility] includes sequencing and/or deletion duplication testing of the following 83 genes: ALK, APC, ATM, AXIN2,BAP1,  BARD1, BLM, BMPR1A, BRCA1, BRCA2, BRIP1, CASR, CDC73, CDH1, CDK4, CDKN1B, CDKN1C, CDKN2A (p14ARF), CDKN2A (p16INK4a), CEBPA, CHEK2, CTNNA1, DICER1, DIS3L2, EGFR (c.2369C>T, p.Thr790Met variant only), EPCAM (Deletion/duplication testing only), FH, FLCN, GATA2, GPC3, GREM1 (Promoter region deletion/duplication testing only), HOXB13 (c.251G>A, p.Gly84Glu), HRAS, KIT, MAX, MEN1, MET, MITF (c.952G>A, p.Glu318Lys variant only), MLH1, MSH2, MSH3, MSH6, MUTYH, NBN, NF1, NF2, NTHL1, PALB2, PDGFRA, PHOX2B, PMS2, POLD1, POLE, POT1, PRKAR1A, PTCH1, PTEN, RAD50, RAD51C, RAD51D, RB1, RECQL4, RET, RUNX1, SDHAF2, SDHA (sequence changes only), SDHB, SDHC, SDHD, SMAD4, SMARCA4, SMARCB1, SMARCE1, STK11, SUFU, TERC, TERT, TMEM127, TP53, TSC1, TSC2, VHL, WRN and WT1.   Results: No pathogenic mutations were identified. A VUS in the GATA2 gene c.1348G>A (p.Gly450Arg) was identified.  The date of this test report is 05/25/2017.     05/25/2017 Surgery   RIGHT  BREAST LUMPECTOMY WITH RADIOACTIVE SEED  AND  RIGHT AXILLARY SENTINEL LYMPH NODE BIOPSY and Pot placement by Dr. Excell Seltzer 05/25/17   05/25/2017 Pathology Results   Diagnosis 05/25/17 1. Breast, lumpectomy, right - INVASIVE DUCTAL CARCINOMA, GRADE II/III, SPANNING 2.2 CM. - DUCTAL CARCINOMA IN SITU, HIGH GRADE. - THE SURGICAL RESECTION MARGINS ARE NEGATIVE FOR CARCINOMA. - SEE ONCOLOGY TABLE BELOW. 2. Breast, excision, right additional superior margin - BENIGN FIBROADIPOSE TISSUE. - BENIGN SKELETAL MUSCLE. - SEE COMMENT. 3. Breast, excision, right additional lateral margin - BENIGN BREAST PARENCHYMA. - THERE IS NO EVIDENCE OF MALIGNANCY. - SEE COMMENT. 4. Breast, excision, chest wall margin - BENIGN FIBROADIPOSE TISSUE. - THERE IS NO EVIDENCE OF MALIGNANCY. - SEE COMMENT. 5. Lymph node, sentinel, biopsy, right axillary - THERE IS NO EVIDENCE OF CARCINOMA IN 1 OF 1 LYMPH NODE (0/1). 6. Lymph node, sentinel, biopsy, right axillary - THERE IS NO EVIDENCE OF CARCINOMA IN 1 OF 1 LYMPH NODE (0/1). 7. Lymph node, sentinel, biopsy, right axillary - THERE IS NO EVIDENCE OF CARCINOMA IN 1 OF 1 LYMPH NODE (0/1).   06/26/2017 PET scan   PET  IMPRESSION: 1. Postoperative findings both in the right breast and in the anatomic pelvis, with associated low-grade activity considered to be postoperative in nature. No hypermetabolic adenopathy or hypermetabolic lesions are identified to suggest active metastatic disease/malignancy. 2. Other imaging findings of potential clinical significance: Aortic Atherosclerosis (ICD10-I70.0). Sigmoid colon diverticulosis. Biapical pleuroparenchymal scarring in the lungs. Small amount of free pelvic fluid in the cul-de-sac, likely postoperative.    07/01/2017 - 10/14/2017 Chemotherapy   Adjuvant TCH with Onpro every 3 weeks for 6 cycles starting on 07/01/17, Changed Taxol to abraxane with cycle 4 due to drug rash reaction, followed by Herceptin every 3 weeks for  6 months    11/01/2017 - 12/08/2017 Radiation Therapy   Radiation therapy to her right breast with [name]   11/04/2017 - 06/2018 Chemotherapy   Maintenance Herceptin starting 11/04/17 and will comeplete her 12 months in 06/2018    12/23/2017 -  Anti-estrogen oral therapy   Adjuvant letrozole 2.5 mg daily started 12/23/17, switched to exemestane on 04/21/18 due to joint pain. Could not afford aromasin, started anastrozole 04/2018.    12/30/2017 Imaging   CT CAP W Contrast 12/30/17 IMPRESSION: Increased size of 1.1 cm aorto-caval retroperitoneal lymph node. This could be reactive due to interval hysterectomy, however metastatic carcinoma cannot be excluded. Consider continued attention on short-term follow-up CT, or PET-CT scan for further evaluation.  Mild hepatic steatosis.   01/25/2018 PET scan   IMPRESSION: 1. Enlarging hypermetabolic aortocaval lymph node is worrisome for metastatic disease. 2. Probable postoperative seroma in the medial right breast, with associated mild inflammatory hypermetabolism.   02/17/2018 -  Antibody Plan   Keytruda every 3 weeks starting 02/17/18. Plan for 2 years    04/28/2018 Mammogram   Probably benign. Calcifications in the right breast most likely are dystrophic, related to previous surgery, and are probably benign.    04/28/2018 Imaging   DEXA Scan T Score -2.0   06/10/2018 Echocardiogram   06/10/2018 ECHO LV EF: 55% -   60%   08/28/2018 - 08/2018 Chemotherapy   Neratinib '4mg'$  for 1 week then titrate up with 1 additional tablet weekly up to 12 mg starting 08/28/18. Stopped soon after starting due to diarrhea.   10/25/2018 Imaging   CT CAP 10/25/18  IMPRESSION: 1. No evidence of metastatic disease. 2.  Aortic atherosclerosis (ICD10-170.0).   Endometrial adenocarcinoma ([place])  05/07/2017 Initial Diagnosis   Endometrial  adenocarcinoma ([place])   06/09/2017 Surgery   XI ROBOTIC ASSISTED TOTAL LAPOROSCOPIC HYSTERECTOMY WITH BILATERAL SALPINGO OOPHORECTOMY  and SENTINEL NODE BIOPSY by Dr. Denman George 06/09/17   06/09/2017 Pathology Results   Diagnosis 06/09/17 1. Lymph node, sentinel, biopsy, right external iliac - METASTATIC ADENOCARCINOMA IN ONE LYMPH NODE (1/1). 2. Lymph node, sentinel, biopsy, left obturator - ONE BENIGN LYMPH NODE (0/1). 3. Lymph node, sentinel, biopsy, left external iliac - METASTATIC ADENOCARCINOMA IN ONE LYMPH NODE (1/1). 4. Uterus +/- tubes/ovaries, neoplastic ENDOMETRIUM: - ENDOMETRIAL ADENOCARCINOMA, 3.2 CM. - CARCINOMA INVADES INNER HALF OF MYOMETRIUM. 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 - CERVIX, BILATERAL FALLOPIAN TUBES AND BILATERAL OVARIES FREE OF TUMOR   08/18/2017 - 09/17/2017 Radiation Therapy   vaginal brachytherapy per 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   02/17/2018 -  Chemotherapy   Keytruda every 3 weeks starting 02/17/18   04/11/2019 Imaging   CT AP W Contrast  IMPRESSION: 1. No evidence of recurrent or metastatic carcinoma within the abdomen or pelvis. 2. Colonic diverticulosis. No radiographic evidence of diverticulitis.        CURRENT THERAPY:  -Keytruda every 3 weeksstarting 02/17/18. -Letrozole 2.'5mg'$  daily starting 12/23/17. Switched to Anastrozole in 04/2018 due to joint pain  INTERVAL HISTORY:  Dawn [name] is here for a follow up and treatment. She presents to the clinic alone. She notes when hiking she tripped on a root and fell ending in breaking her nose. There was blood lose. She has been feeling dizzy occasionally. She also has trouble breathing through her nose since. She plans to see ENT.   She notes occasional lower abdominal pressure. Her last pap smear was last year. She wonders does she need another pap smear this year as she does not think her cervix was removed.  She has been having PT twice a week for her frozen left shoulder and her pain has  much improved.    REVIEW OF SYSTEMS:   Constitutional: Denies fevers, chills or abnormal weight loss (+) dizziness occasionally  Eyes: Denies blurriness of vision Ears, nose, mouth, throat, and face: Denies mucositis or sore throat (+) Dyspnea when breathing through nose MSK: (+) nose fracture (+) Left shoulder much improved.  Respiratory: Denies cough, dyspnea or wheezes Cardiovascular: Denies palpitation, chest discomfort or lower extremity swelling Gastrointestinal:  Denies nausea, heartburn or change in bowel habits Skin: Denies abnormal skin rashes Lymphatics: Denies new lymphadenopathy or easy bruising Neurological:Denies numbness, tingling or new weaknesses Behavioral/Psych: Mood is stable, no new changes  All other systems were reviewed with the patient and are negative.  MEDICAL HISTORY:  Past Medical History:  Diagnosis Date   Anemia    as a child.   Arthritis    Bilateral cataracts    Bilateral leg cramps    Cancer ([place])    skin   Dyspnea    Family history of bladder cancer    Family history of colon cancer in father    Vira Agar' corneal dystrophy    GERD (gastroesophageal reflux disease)    Hearing loss    Right side 30%   History of hiatal hernia    small size   History of radiation therapy 08/21/17, 08/28/17,09/01/17, 09/11/17, 09/17/17   Vaginal cuff brachytherapy.    History of radiation therapy 11/11/17- 12/08/17   Right Breast treated to 40.05 Gy with 15 fx of 2.67 Gy and  a boost of 10 Gy with 5 fx.    Hyperlipidemia    Hypertension    IBS (irritable bowel syndrome)    hx of   Malignant neoplasm of upper-inner quadrant of right female breast ([place]) 04/2017   NAFL (nonalcoholic fatty liver)    Obesity    PONV (postoperative nausea and vomiting)    Tuberculosis    tested positive, mother had when patient was child   Uterine cancer ([place]) 04/2017   endometrial cancer   Varices, gastric    Vertigo     SURGICAL HISTORY: Past  Surgical History:  Procedure Laterality Date   BREAST BIOPSY Right 04/27/2017   BREAST LUMPECTOMY WITH RADIOACTIVE SEED AND SENTINEL LYMPH NODE BIOPSY Right 05/25/2017   Procedure: RIGHT BREAST LUMPECTOMY WITH RADIOACTIVE SEED AND  RIGHT AXILLARY SENTINEL LYMPH NODE BIOPSY;  Surgeon: Excell Seltzer, MD;  Location: [facility];  Service: General;  Laterality: Right;   CATARACT EXTRACTION, BILATERAL     COLONOSCOPY     HYSTEROSCOPY W/D&C N/A 04/29/2017   Procedure: DILATATION AND CURETTAGE /HYSTEROSCOPY;  Surgeon: Linda Hedges, DO;  Location: [facility];  Service: Gynecology;  Laterality: N/A;   PORTACATH PLACEMENT Left 05/25/2017   Procedure: INSERTION PORT-A-CATH;  Surgeon: Excell Seltzer, MD;  Location: [facility];  Service: General;  Laterality: Left;   ROBOTIC ASSISTED TOTAL HYSTERECTOMY WITH BILATERAL SALPINGO OOPHERECTOMY N/A 06/09/2017   Procedure: XI ROBOTIC ASSISTED TOTAL LAPOROSCOPIC HYSTERECTOMY WITH BILATERAL SALPINGO OOPHORECTOMY;  Surgeon: Everitt Amber, MD;  Location: [facility];  Service: Gynecology;  Laterality: N/A;   SENTINEL NODE BIOPSY N/A 06/09/2017   Procedure: SENTINEL NODE BIOPSY;  Surgeon: Everitt Amber, MD;  Location: [facility];  Service: Gynecology;  Laterality: N/A;    I have reviewed the social history and family history with the patient and they are unchanged from previous note.  ALLERGIES:  is allergic to ciprofloxacin; crestor [rosuvastatin calcium]; fosamax [alendronate sodium]; penicillins; and taxol [paclitaxel].  MEDICATIONS:  Current Outpatient Medications  Medication Sig Dispense Refill   acetaminophen (TYLENOL) 500 MG tablet Take 500 mg by mouth every 6 (six) hours as needed for moderate pain.     anastrozole (ARIMIDEX) 1 MG tablet Take 1 tablet (1 mg total) by mouth daily. 90 tablet 3   Ascorbic Acid (VITAMIN C) 1000 MG tablet Take 1,000 mg by mouth daily.     b complex vitamins capsule Take 1 capsule by mouth daily.       Calcium Carbonate-Vitamin D3 (CALCIUM 600-D) 600-400 MG-UNIT TABS Take 1-2 tablets by mouth daily.     diphenhydrAMINE (BENADRYL) 25 MG tablet Take 25-50 mg by mouth every 6 (six) hours as needed for sleep.     ibandronate (BONIVA) 150 MG tablet Take 1 tablet (150 mg total) by mouth every 30 (thirty) days. 3 tablet 3   ibuprofen (ADVIL,MOTRIN) 200 MG tablet Take 200 mg by mouth every 6 (six) hours as needed.     levothyroxine (SYNTHROID) 50 MCG tablet Take 1 tablet (50 mcg total) by mouth daily. 90 tablet 1   lidocaine-prilocaine (EMLA) cream APPLY TO AFFECTED AREA ONCE 30 g 2   Multiple Vitamin (MULTIVITAMIN) capsule Take 1 capsule by mouth daily.       olmesartan-hydrochlorothiazide (BENICAR HCT) 20-12.5 MG tablet Take 1 tablet by mouth daily. 90 tablet 2   Pitavastatin Calcium (LIVALO) 2 MG TABS Take 1.5 tablets (3 mg total) by mouth every evening. Takes 1.5 tablet 135 tablet 3   prednisoLONE acetate (PRED FORTE) 1 %  ophthalmic suspension Place 1 drop into the left eye 3 (three) times daily.     sodium chloride (MURO 128) 5 % ophthalmic solution Place 2 drops into both eyes daily.     tiZANidine (ZANAFLEX) 2 MG tablet Take 1 tablet (2 mg total) by mouth at bedtime as needed for muscle spasms. 30 tablet 1   triamcinolone cream (KENALOG) 0.1 % APPLY TO ITCHY SPOTS ONCE DAILY  1   No current facility-administered medications for this visit.    Facility-Administered Medications Ordered in Other Visits  Medication Dose Route Frequency Provider Last Rate Last Dose   0.9 %  sodium chloride infusion   Intravenous Once Truitt Merle, MD       heparin lock flush 100 unit/mL  500 Units Intracatheter Once PRN Truitt Merle, MD       pembrolizumab [facility]) 200 mg in sodium chloride 0.9 % 50 mL chemo infusion  200 mg Intravenous Once Truitt Merle, MD       sodium chloride flush (NS) 0.9 % injection 10 mL  10 mL Intracatheter PRN Truitt Merle, MD   10 mL at 08/25/18 1111   sodium chloride flush  (NS) 0.9 % injection 10 mL  10 mL Intracatheter PRN Truitt Merle, MD         PHYSICAL EXAMINATION: ECOG PERFORMANCE STATUS: 1 - Symptomatic but completely ambulatory  Vitals:   04/13/19 0911  BP: 131/78  Pulse: 87  Resp: 18  Temp: 97.8 F (36.6 C)  SpO2: 99%   Filed Weights   04/13/19 0911  Weight: 190 lb 14.4 oz (86.6 kg)    GENERAL:alert, no distress and comfortable SKIN: skin color, texture, turgor are normal, no rashes or significant lesions EYES: normal, Conjunctiva are pink and non-injected, sclera clear  NECK: supple, thyroid normal size, non-tender, without nodularity LYMPH:  no palpable lymphadenopathy in the cervical, axillary  LUNGS: clear to auscultation and percussion with normal breathing effort HEART: regular rate & rhythm and no murmurs and no lower extremity edema ABDOMEN:abdomen soft, non-tender and normal bowel sounds Musculoskeletal:no cyanosis of digits and no clubbing  NEURO: alert & oriented x 3 with fluent speech, no focal motor/sensory deficits   LABORATORY DATA:  I have reviewed the data as listed CBC Latest Ref Rng & Units 04/11/2019 03/23/2019 03/03/2019  WBC 4.0 - 10.5 K/uL 4.7 3.5(L) 4.3  Hemoglobin 12.0 - 15.0 g/dL 14.1 13.7 13.5  Hematocrit 36.0 - 46.0 % 40.1 39.3 39.0  Platelets 150 - 400 K/uL 158 121(L) 157     CMP Latest Ref Rng & Units 04/11/2019 03/23/2019 03/03/2019  Glucose 70 - 99 mg/dL 104(H) 107(H) 98  BUN 8 - 23 mg/dL '10 13 10  '$ Creatinine 0.44 - 1.00 mg/dL 0.78 0.78 0.77  Sodium 135 - 145 mmol/L 136 136 138  Potassium 3.5 - 5.1 mmol/L 3.6 3.6 3.6  Chloride 98 - 111 mmol/L 98 101 102  CO2 22 - 32 mmol/L '28 28 28  '$ Calcium 8.9 - 10.3 mg/dL 9.3 8.6(L) 9.0  Total Protein 6.5 - 8.1 g/dL 7.3 7.0 7.3  Total Bilirubin 0.3 - 1.2 mg/dL 0.8 0.6 0.7  Alkaline Phos 38 - 126 U/L 44 46 52  AST 15 - 41 U/L 56(H) 59(H) 60(H)  ALT 0 - 44 U/L 92(H) 97(H) 127(H)      RADIOGRAPHIC STUDIES: I have personally reviewed the radiological images as  listed and agreed with the findings in the report. Ct Abdomen Pelvis W Contrast  Result Date: 04/11/2019 CLINICAL DATA:  Follow-up  endometrial carcinoma. Currently undergoing immunotherapy. EXAM: CT ABDOMEN AND PELVIS WITH CONTRAST TECHNIQUE: Multidetector CT imaging of the abdomen and pelvis was performed using the standard protocol following bolus administration of intravenous contrast. CONTRAST:  126m OMNIPAQUE IOHEXOL 300 MG/ML  SOLN COMPARISON:  10/25/2018 FINDINGS: Lower Chest: No acute findings. Hepatobiliary: No hepatic masses identified. Gallbladder is unremarkable. Pancreas:  No mass or inflammatory changes. Spleen: Within normal limits in size and appearance. Adrenals/Urinary Tract: No masses identified. No evidence of hydronephrosis. Stomach/Bowel: No evidence of obstruction, inflammatory process or abnormal fluid collections. Normal appendix visualized. Diverticulosis is seen mainly involving the sigmoid colon, however there is no evidence of diverticulitis. Vascular/Lymphatic: No pathologically enlarged lymph nodes. No abdominal aortic aneurysm. Aortic atherosclerosis. Reproductive: Prior hysterectomy noted. Adnexal regions are unremarkable in appearance. No evidence of adnexal or other pelvic masses. No evidence of peritoneal thickening or nodularity. No evidence of ascites. Other:  Stable small paraumbilical hernia containing only fat. Musculoskeletal:  No suspicious bone lesions identified. IMPRESSION: 1. No evidence of recurrent or metastatic carcinoma within the abdomen or pelvis. 2. Colonic diverticulosis. No radiographic evidence of diverticulitis. Aortic Atherosclerosis (ICD10-I70.0). Electronically Signed   By: JMarlaine HindM.D.   On: 04/11/2019 13:15     ASSESSMENT & PLAN:  Dawn MANGANELLIis a 68y.o. female with   1. 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 Treated with right breast  lumpectomy, adjuvant chemo and radiation.She tried Nerlynx but could not tolerate due to diarrhea, and does not want try again. -She started anti-estrogen therapy with letrozole. Due to joint pain I switched her to Anastrozole in 04/2018. Tolerating well. -She is clinically doing well and stable. she is due for mammogram next month  -Continue anastrozole -F/u in6weeks  2. Endometrial adenocarcinomawith lymph node metastasis, pT1apN1M0, FIGO stage IIIc, MSI-H, probable RP node recurrence in 12/2017 -Diagnosed in 04/2017. Treated with hysterectomy, BSO, adjuvant radiation, and chemo with Carbotaxol. Currently on Keytruda every 3 weeksfor presumed retroperitoneal lymph node metastasis since 02/17/18. Tolerating well. She will continue to complete 2 years treatment.If noevidence of progression, plan to complete in 02/2020. -Given recent occasional pressure in her lower abdomen she plans to return to her Gyn for pap smear. Last was over 1 year ago.  -I personally reviewed her her CT AP from 04/11/19 which shows no evidence of recurrent or metastatic carcinoma. Repeat scan in 6 months  -Labs reviewed from this week, CBC and CMP WNL except BG 104, AST 56, ALT 92. TSH levels have remained in normal range. Overall adequate to proceed with Keytruda today.   -F/u in 9 weeks  3. HTN -Controlled, Continue tof/u with PCP -BP at 131/78 (04/13/19)  4. Transaminitis -Secondary to fatty liver disease -Stable   5.Osteopenia -DEXA scan in 05/2018 revealed T score of -2.0. -She is on monthly boniva, prescribed by her PCP, since 02/2018.Continue -ContinueVitamin D and Calcium supplements. -will check her Vitamin D level every 6 months.   6.Metabolic syndrome, hyperglycemia  -She has no history of diabetes. Her random blood glucose was 213, we discussed diabetic diet and exercise -She was recently found to have abnormal TSH levels. She has been seen by endocrinologist and has started  levothyroxine on 01/04/19. Will monitor TSH level every 3 weeks.  -Her weight does continue to trend up. I encouraged her to remain active.  -Latest TSH at 1.615 (03/23/19), HB A1c at 5.4 (02/09/19) -Continue to follow-up with her primary care  physicianand Endocrinologist.  7. Acute Left shoulder pain from Frozen shoulder   -She was seen by Orthopedist for impingement of left shoulder.  -She is receiving cortisone shots which moderately helps. -She has been getting PT on her shoulder and her pain has much improved.    8. Nasal Bone Fracture  -She broke her nose after tripping during a hike -She did her CT head at ER  -Since then she has been feeling dizzy occasionally and having trouble breathing through her nose. She plans to see ENT for evaluation.    Plan -she submitted papers to give her more time off work, will fill out for her  -Scan reviewed, shows NED  -Labs reviewed and adequate to proceed with Keytruda today -Continue Anastrozole -Keytruda in3,6 and 9 weeks, OK to treat without lab each time  -Lab in 6 weeks  -F/u in 9 weeks    No problem-specific Assessment & Plan notes found for this encounter.   No orders of the defined types were placed in this encounter.  All questions were answered. The patient knows to call the clinic with any problems, questions or concerns. No barriers to learning was detected. I spent 20 minutes counseling the patient face to face. The total time spent in the appointment was 25 minutes and more than 50% was on counseling and review of test results     Truitt Merle, MD 04/13/2019   I, Joslyn Devon, am acting as scribe for Truitt Merle, MD.   I have reviewed the above documentation for accuracy and completeness, and I agree with the above.

## 2019-04-12 DIAGNOSIS — M7502 Adhesive capsulitis of left shoulder: Secondary | ICD-10-CM | POA: Diagnosis not present

## 2019-04-12 DIAGNOSIS — M25511 Pain in right shoulder: Secondary | ICD-10-CM | POA: Diagnosis not present

## 2019-04-12 DIAGNOSIS — M25612 Stiffness of left shoulder, not elsewhere classified: Secondary | ICD-10-CM | POA: Diagnosis not present

## 2019-04-13 ENCOUNTER — Inpatient Hospital Stay: Payer: Medicare Other

## 2019-04-13 ENCOUNTER — Inpatient Hospital Stay (HOSPITAL_BASED_OUTPATIENT_CLINIC_OR_DEPARTMENT_OTHER): Payer: Medicare Other | Admitting: Hematology

## 2019-04-13 ENCOUNTER — Other Ambulatory Visit: Payer: Self-pay

## 2019-04-13 ENCOUNTER — Telehealth: Payer: Self-pay | Admitting: Hematology

## 2019-04-13 ENCOUNTER — Encounter: Payer: Self-pay | Admitting: Hematology

## 2019-04-13 VITALS — BP 131/78 | HR 87 | Temp 97.8°F | Resp 18 | Ht 63.0 in | Wt 190.9 lb

## 2019-04-13 DIAGNOSIS — Z923 Personal history of irradiation: Secondary | ICD-10-CM

## 2019-04-13 DIAGNOSIS — R0600 Dyspnea, unspecified: Secondary | ICD-10-CM

## 2019-04-13 DIAGNOSIS — Z170 Estrogen receptor positive status [ER+]: Secondary | ICD-10-CM | POA: Diagnosis not present

## 2019-04-13 DIAGNOSIS — M810 Age-related osteoporosis without current pathological fracture: Secondary | ICD-10-CM

## 2019-04-13 DIAGNOSIS — Z79811 Long term (current) use of aromatase inhibitors: Secondary | ICD-10-CM

## 2019-04-13 DIAGNOSIS — S022XXA Fracture of nasal bones, initial encounter for closed fracture: Secondary | ICD-10-CM

## 2019-04-13 DIAGNOSIS — I700 Atherosclerosis of aorta: Secondary | ICD-10-CM

## 2019-04-13 DIAGNOSIS — R946 Abnormal results of thyroid function studies: Secondary | ICD-10-CM

## 2019-04-13 DIAGNOSIS — M8580 Other specified disorders of bone density and structure, unspecified site: Secondary | ICD-10-CM

## 2019-04-13 DIAGNOSIS — Z9221 Personal history of antineoplastic chemotherapy: Secondary | ICD-10-CM

## 2019-04-13 DIAGNOSIS — R7401 Elevation of levels of liver transaminase levels: Secondary | ICD-10-CM

## 2019-04-13 DIAGNOSIS — K760 Fatty (change of) liver, not elsewhere classified: Secondary | ICD-10-CM

## 2019-04-13 DIAGNOSIS — I10 Essential (primary) hypertension: Secondary | ICD-10-CM

## 2019-04-13 DIAGNOSIS — K5730 Diverticulosis of large intestine without perforation or abscess without bleeding: Secondary | ICD-10-CM

## 2019-04-13 DIAGNOSIS — C778 Secondary and unspecified malignant neoplasm of lymph nodes of multiple regions: Secondary | ICD-10-CM

## 2019-04-13 DIAGNOSIS — Z90710 Acquired absence of both cervix and uterus: Secondary | ICD-10-CM

## 2019-04-13 DIAGNOSIS — C50211 Malignant neoplasm of upper-inner quadrant of right female breast: Secondary | ICD-10-CM

## 2019-04-13 DIAGNOSIS — M1990 Unspecified osteoarthritis, unspecified site: Secondary | ICD-10-CM

## 2019-04-13 DIAGNOSIS — E785 Hyperlipidemia, unspecified: Secondary | ICD-10-CM | POA: Diagnosis not present

## 2019-04-13 DIAGNOSIS — Z79899 Other long term (current) drug therapy: Secondary | ICD-10-CM

## 2019-04-13 DIAGNOSIS — C541 Malignant neoplasm of endometrium: Secondary | ICD-10-CM

## 2019-04-13 DIAGNOSIS — Z880 Allergy status to penicillin: Secondary | ICD-10-CM

## 2019-04-13 DIAGNOSIS — R42 Dizziness and giddiness: Secondary | ICD-10-CM

## 2019-04-13 DIAGNOSIS — Z5112 Encounter for antineoplastic immunotherapy: Secondary | ICD-10-CM | POA: Diagnosis not present

## 2019-04-13 DIAGNOSIS — E8881 Metabolic syndrome: Secondary | ICD-10-CM

## 2019-04-13 DIAGNOSIS — Z881 Allergy status to other antibiotic agents status: Secondary | ICD-10-CM

## 2019-04-13 DIAGNOSIS — Z800 Family history of malignant neoplasm of digestive organs: Secondary | ICD-10-CM

## 2019-04-13 DIAGNOSIS — Z8052 Family history of malignant neoplasm of bladder: Secondary | ICD-10-CM

## 2019-04-13 DIAGNOSIS — K429 Umbilical hernia without obstruction or gangrene: Secondary | ICD-10-CM

## 2019-04-13 MED ORDER — HEPARIN SOD (PORK) LOCK FLUSH 100 UNIT/ML IV SOLN
500.0000 [IU] | Freq: Once | INTRAVENOUS | Status: AC | PRN
  Administered 2019-04-13: 11:00:00 500 [IU]
  Filled 2019-04-13: qty 5

## 2019-04-13 MED ORDER — SODIUM CHLORIDE 0.9% FLUSH
10.0000 mL | INTRAVENOUS | Status: DC | PRN
  Administered 2019-04-13: 11:00:00 10 mL
  Filled 2019-04-13: qty 10

## 2019-04-13 MED ORDER — SODIUM CHLORIDE 0.9 % IV SOLN
200.0000 mg | Freq: Once | INTRAVENOUS | Status: AC
  Administered 2019-04-13: 200 mg via INTRAVENOUS
  Filled 2019-04-13: qty 8

## 2019-04-13 MED ORDER — SODIUM CHLORIDE 0.9 % IV SOLN
Freq: Once | INTRAVENOUS | Status: AC
  Administered 2019-04-13: 10:00:00 via INTRAVENOUS
  Filled 2019-04-13: qty 250

## 2019-04-13 NOTE — Telephone Encounter (Signed)
Gave avs and calendar ° °

## 2019-04-13 NOTE — Patient Instructions (Signed)
[facility] Discharge Instructions for Patients Receiving Chemotherapy  Today you received the following chemotherapy agents:  Keytruda.  To help prevent nausea and vomiting after your treatment, we encourage you to take your nausea medication as directed.   If you develop nausea and vomiting that is not controlled by your nausea medication, call the clinic.   BELOW ARE SYMPTOMS THAT SHOULD BE REPORTED IMMEDIATELY:  *FEVER GREATER THAN 100.5 F  *CHILLS WITH OR WITHOUT FEVER  NAUSEA AND VOMITING THAT IS NOT CONTROLLED WITH YOUR NAUSEA MEDICATION  *UNUSUAL SHORTNESS OF BREATH  *UNUSUAL BRUISING OR BLEEDING  TENDERNESS IN MOUTH AND THROAT WITH OR WITHOUT PRESENCE OF ULCERS  *URINARY PROBLEMS  *BOWEL PROBLEMS  UNUSUAL RASH Items with * indicate a potential emergency and should be followed up as soon as possible.  Feel free to call the clinic should you have any questions or concerns. The clinic phone number is [phone].  Please show the CHEMO ALERT CARD at check-in to the Emergency Department and triage nurse.    

## 2019-04-13 NOTE — Progress Notes (Signed)
Disability papers dropped off to be completed.

## 2019-04-14 DIAGNOSIS — M25612 Stiffness of left shoulder, not elsewhere classified: Secondary | ICD-10-CM | POA: Diagnosis not present

## 2019-04-14 DIAGNOSIS — M25511 Pain in right shoulder: Secondary | ICD-10-CM | POA: Diagnosis not present

## 2019-04-14 DIAGNOSIS — M7502 Adhesive capsulitis of left shoulder: Secondary | ICD-10-CM | POA: Diagnosis not present

## 2019-04-19 DIAGNOSIS — M25612 Stiffness of left shoulder, not elsewhere classified: Secondary | ICD-10-CM | POA: Diagnosis not present

## 2019-04-19 DIAGNOSIS — M7502 Adhesive capsulitis of left shoulder: Secondary | ICD-10-CM | POA: Diagnosis not present

## 2019-04-19 DIAGNOSIS — M25511 Pain in right shoulder: Secondary | ICD-10-CM | POA: Diagnosis not present

## 2019-04-20 DIAGNOSIS — S022XXD Fracture of nasal bones, subsequent encounter for fracture with routine healing: Secondary | ICD-10-CM | POA: Diagnosis not present

## 2019-04-20 DIAGNOSIS — J343 Hypertrophy of nasal turbinates: Secondary | ICD-10-CM | POA: Diagnosis not present

## 2019-04-20 MED FILL — FLUTICASONE PROP 50 MCG SPR: 50 | 30 days supply | Qty: 16 | Fill #0

## 2019-04-21 ENCOUNTER — Other Ambulatory Visit: Payer: Self-pay

## 2019-04-21 ENCOUNTER — Telehealth: Payer: Self-pay

## 2019-04-21 DIAGNOSIS — I10 Essential (primary) hypertension: Secondary | ICD-10-CM

## 2019-04-21 MED ORDER — OLMESARTAN MEDOXOMIL-HCTZ 20-12.5 MG PO TABS: 1.0000 | ORAL_TABLET | Freq: Every day | ORAL | 2 refills | Status: DC

## 2019-04-21 NOTE — Telephone Encounter (Signed)
Faxed signed order back to [facility] diagnostic mammogram, sent to HIM for scan to chart.

## 2019-04-22 DIAGNOSIS — M25511 Pain in right shoulder: Secondary | ICD-10-CM | POA: Diagnosis not present

## 2019-04-22 DIAGNOSIS — M7502 Adhesive capsulitis of left shoulder: Secondary | ICD-10-CM | POA: Diagnosis not present

## 2019-04-22 DIAGNOSIS — M25612 Stiffness of left shoulder, not elsewhere classified: Secondary | ICD-10-CM | POA: Diagnosis not present

## 2019-05-02 DIAGNOSIS — Z8542 Personal history of malignant neoplasm of other parts of uterus: Secondary | ICD-10-CM | POA: Diagnosis not present

## 2019-05-02 DIAGNOSIS — Z853 Personal history of malignant neoplasm of breast: Secondary | ICD-10-CM | POA: Diagnosis not present

## 2019-05-02 DIAGNOSIS — Z8041 Family history of malignant neoplasm of ovary: Secondary | ICD-10-CM | POA: Diagnosis not present

## 2019-05-02 LAB — HM MAMMOGRAPHY

## 2019-05-04 ENCOUNTER — Inpatient Hospital Stay: Payer: Medicare Other | Attending: Hematology

## 2019-05-04 ENCOUNTER — Other Ambulatory Visit: Payer: Self-pay

## 2019-05-04 VITALS — BP 121/70 | HR 70 | Temp 98.5°F | Resp 16 | Wt 190.5 lb

## 2019-05-04 DIAGNOSIS — Z5112 Encounter for antineoplastic immunotherapy: Secondary | ICD-10-CM | POA: Diagnosis not present

## 2019-05-04 DIAGNOSIS — I10 Essential (primary) hypertension: Secondary | ICD-10-CM | POA: Insufficient documentation

## 2019-05-04 DIAGNOSIS — Z170 Estrogen receptor positive status [ER+]: Secondary | ICD-10-CM | POA: Insufficient documentation

## 2019-05-04 DIAGNOSIS — Z79899 Other long term (current) drug therapy: Secondary | ICD-10-CM | POA: Diagnosis not present

## 2019-05-04 DIAGNOSIS — Z79811 Long term (current) use of aromatase inhibitors: Secondary | ICD-10-CM | POA: Insufficient documentation

## 2019-05-04 DIAGNOSIS — M8580 Other specified disorders of bone density and structure, unspecified site: Secondary | ICD-10-CM | POA: Insufficient documentation

## 2019-05-04 DIAGNOSIS — Z9221 Personal history of antineoplastic chemotherapy: Secondary | ICD-10-CM | POA: Insufficient documentation

## 2019-05-04 DIAGNOSIS — C50211 Malignant neoplasm of upper-inner quadrant of right female breast: Secondary | ICD-10-CM | POA: Insufficient documentation

## 2019-05-04 DIAGNOSIS — C541 Malignant neoplasm of endometrium: Secondary | ICD-10-CM | POA: Insufficient documentation

## 2019-05-04 DIAGNOSIS — Z923 Personal history of irradiation: Secondary | ICD-10-CM | POA: Insufficient documentation

## 2019-05-04 MED ORDER — SODIUM CHLORIDE 0.9 % IV SOLN
Freq: Once | INTRAVENOUS | Status: AC
  Administered 2019-05-04: 09:00:00 via INTRAVENOUS
  Filled 2019-05-04: qty 250

## 2019-05-04 MED ORDER — SODIUM CHLORIDE 0.9% FLUSH
10.0000 mL | INTRAVENOUS | Status: DC | PRN
  Administered 2019-05-04: 10 mL
  Filled 2019-05-04: qty 10

## 2019-05-04 MED ORDER — SODIUM CHLORIDE 0.9 % IV SOLN
200.0000 mg | Freq: Once | INTRAVENOUS | Status: AC
  Administered 2019-05-04: 200 mg via INTRAVENOUS
  Filled 2019-05-04: qty 8

## 2019-05-04 MED ORDER — HEPARIN SOD (PORK) LOCK FLUSH 100 UNIT/ML IV SOLN
500.0000 [IU] | Freq: Once | INTRAVENOUS | Status: AC | PRN
  Administered 2019-05-04: 500 [IU]
  Filled 2019-05-04: qty 5

## 2019-05-04 NOTE — Patient Instructions (Signed)
[facility] Discharge Instructions for Patients Receiving Chemotherapy  Today you received the following chemotherapy agents:  Keytruda.  To help prevent nausea and vomiting after your treatment, we encourage you to take your nausea medication as directed.   If you develop nausea and vomiting that is not controlled by your nausea medication, call the clinic.   BELOW ARE SYMPTOMS THAT SHOULD BE REPORTED IMMEDIATELY:  *FEVER GREATER THAN 100.5 F  *CHILLS WITH OR WITHOUT FEVER  NAUSEA AND VOMITING THAT IS NOT CONTROLLED WITH YOUR NAUSEA MEDICATION  *UNUSUAL SHORTNESS OF BREATH  *UNUSUAL BRUISING OR BLEEDING  TENDERNESS IN MOUTH AND THROAT WITH OR WITHOUT PRESENCE OF ULCERS  *URINARY PROBLEMS  *BOWEL PROBLEMS  UNUSUAL RASH Items with * indicate a potential emergency and should be followed up as soon as possible.  Feel free to call the clinic should you have any questions or concerns. The clinic phone number is [phone].  Please show the CHEMO ALERT CARD at check-in to the Emergency Department and triage nurse.    

## 2019-05-04 NOTE — Progress Notes (Signed)
Per [name], okay for patient to proceed with treatment today with no labs.

## 2019-05-12 ENCOUNTER — Telehealth: Payer: Self-pay | Admitting: Hematology

## 2019-05-12 NOTE — Telephone Encounter (Signed)
Called patient to inform her that I moved her appt up by 30 mins.  Left my number for her to call back if she wants to change it back

## 2019-05-25 ENCOUNTER — Other Ambulatory Visit: Payer: Self-pay

## 2019-05-25 ENCOUNTER — Other Ambulatory Visit: Payer: Medicare Other

## 2019-05-25 ENCOUNTER — Inpatient Hospital Stay: Payer: Medicare Other

## 2019-05-25 ENCOUNTER — Inpatient Hospital Stay: Payer: Medicare Other | Attending: Hematology

## 2019-05-25 ENCOUNTER — Ambulatory Visit: Payer: Medicare Other

## 2019-05-25 ENCOUNTER — Other Ambulatory Visit: Payer: Self-pay | Admitting: Hematology

## 2019-05-25 VITALS — BP 120/70 | HR 74 | Temp 98.2°F | Resp 16

## 2019-05-25 DIAGNOSIS — Z170 Estrogen receptor positive status [ER+]: Secondary | ICD-10-CM

## 2019-05-25 DIAGNOSIS — R739 Hyperglycemia, unspecified: Secondary | ICD-10-CM | POA: Insufficient documentation

## 2019-05-25 DIAGNOSIS — M25512 Pain in left shoulder: Secondary | ICD-10-CM | POA: Diagnosis not present

## 2019-05-25 DIAGNOSIS — M2550 Pain in unspecified joint: Secondary | ICD-10-CM | POA: Diagnosis not present

## 2019-05-25 DIAGNOSIS — Z800 Family history of malignant neoplasm of digestive organs: Secondary | ICD-10-CM | POA: Diagnosis not present

## 2019-05-25 DIAGNOSIS — M8580 Other specified disorders of bone density and structure, unspecified site: Secondary | ICD-10-CM | POA: Diagnosis not present

## 2019-05-25 DIAGNOSIS — E785 Hyperlipidemia, unspecified: Secondary | ICD-10-CM | POA: Insufficient documentation

## 2019-05-25 DIAGNOSIS — Z8542 Personal history of malignant neoplasm of other parts of uterus: Secondary | ICD-10-CM | POA: Insufficient documentation

## 2019-05-25 DIAGNOSIS — Z791 Long term (current) use of non-steroidal anti-inflammatories (NSAID): Secondary | ICD-10-CM | POA: Diagnosis not present

## 2019-05-25 DIAGNOSIS — K760 Fatty (change of) liver, not elsewhere classified: Secondary | ICD-10-CM | POA: Insufficient documentation

## 2019-05-25 DIAGNOSIS — Z9221 Personal history of antineoplastic chemotherapy: Secondary | ICD-10-CM | POA: Diagnosis not present

## 2019-05-25 DIAGNOSIS — Z79899 Other long term (current) drug therapy: Secondary | ICD-10-CM | POA: Diagnosis not present

## 2019-05-25 DIAGNOSIS — E876 Hypokalemia: Secondary | ICD-10-CM | POA: Diagnosis not present

## 2019-05-25 DIAGNOSIS — Z5112 Encounter for antineoplastic immunotherapy: Secondary | ICD-10-CM | POA: Diagnosis not present

## 2019-05-25 DIAGNOSIS — Z23 Encounter for immunization: Secondary | ICD-10-CM | POA: Insufficient documentation

## 2019-05-25 DIAGNOSIS — E8881 Metabolic syndrome: Secondary | ICD-10-CM | POA: Diagnosis not present

## 2019-05-25 DIAGNOSIS — C50211 Malignant neoplasm of upper-inner quadrant of right female breast: Secondary | ICD-10-CM | POA: Insufficient documentation

## 2019-05-25 DIAGNOSIS — Z923 Personal history of irradiation: Secondary | ICD-10-CM | POA: Diagnosis not present

## 2019-05-25 DIAGNOSIS — R946 Abnormal results of thyroid function studies: Secondary | ICD-10-CM | POA: Diagnosis not present

## 2019-05-25 DIAGNOSIS — I10 Essential (primary) hypertension: Secondary | ICD-10-CM | POA: Diagnosis not present

## 2019-05-25 DIAGNOSIS — Z8052 Family history of malignant neoplasm of bladder: Secondary | ICD-10-CM | POA: Insufficient documentation

## 2019-05-25 DIAGNOSIS — C541 Malignant neoplasm of endometrium: Secondary | ICD-10-CM

## 2019-05-25 DIAGNOSIS — K219 Gastro-esophageal reflux disease without esophagitis: Secondary | ICD-10-CM | POA: Diagnosis not present

## 2019-05-25 DIAGNOSIS — M810 Age-related osteoporosis without current pathological fracture: Secondary | ICD-10-CM

## 2019-05-25 DIAGNOSIS — Z79811 Long term (current) use of aromatase inhibitors: Secondary | ICD-10-CM | POA: Diagnosis not present

## 2019-05-25 LAB — CBC WITH DIFFERENTIAL (CANCER CENTER ONLY)
Abs Immature Granulocytes: 0 10*3/uL (ref 0.00–0.07)
Basophils Absolute: 0 10*3/uL (ref 0.0–0.1)
Basophils Relative: 1 %
Eosinophils Absolute: 0.1 10*3/uL (ref 0.0–0.5)
Eosinophils Relative: 2 %
HCT: 38.9 % (ref 36.0–46.0)
Hemoglobin: 13.5 g/dL (ref 12.0–15.0)
Immature Granulocytes: 0 %
Lymphocytes Relative: 43 %
Lymphs Abs: 1.8 10*3/uL (ref 0.7–4.0)
MCH: 35.5 pg — ABNORMAL HIGH (ref 26.0–34.0)
MCHC: 34.7 g/dL (ref 30.0–36.0)
MCV: 102.4 fL — ABNORMAL HIGH (ref 80.0–100.0)
Monocytes Absolute: 0.4 10*3/uL (ref 0.1–1.0)
Monocytes Relative: 9 %
Neutro Abs: 1.9 10*3/uL (ref 1.7–7.7)
Neutrophils Relative %: 45 %
Platelet Count: 149 10*3/uL — ABNORMAL LOW (ref 150–400)
RBC: 3.8 MIL/uL — ABNORMAL LOW (ref 3.87–5.11)
RDW: 12.2 % (ref 11.5–15.5)
WBC Count: 4.2 10*3/uL (ref 4.0–10.5)
nRBC: 0 % (ref 0.0–0.2)

## 2019-05-25 LAB — CMP (CANCER CENTER ONLY)
ALT: 104 U/L — ABNORMAL HIGH (ref 0–44)
AST: 65 U/L — ABNORMAL HIGH (ref 15–41)
Albumin: 3.8 g/dL (ref 3.5–5.0)
Alkaline Phosphatase: 50 U/L (ref 38–126)
Anion gap: 11 (ref 5–15)
BUN: 13 mg/dL (ref 8–23)
CO2: 26 mmol/L (ref 22–32)
Calcium: 8.9 mg/dL (ref 8.9–10.3)
Chloride: 102 mmol/L (ref 98–111)
Creatinine: 0.76 mg/dL (ref 0.44–1.00)
GFR, Est AFR Am: >60 mL/min (ref >60)
GFR, Estimated: >60 mL/min (ref >60)
Glucose, Bld: 146 mg/dL — ABNORMAL HIGH (ref 70–99)
Potassium: 3.3 mmol/L — ABNORMAL LOW (ref 3.5–5.1)
Sodium: 139 mmol/L (ref 135–145)
Total Bilirubin: 0.6 mg/dL (ref 0.3–1.2)
Total Protein: 6.9 g/dL (ref 6.5–8.1)

## 2019-05-25 MED ORDER — POTASSIUM CHLORIDE CRYS ER 20 MEQ PO TBCR
20.0000 meq | EXTENDED_RELEASE_TABLET | Freq: Once | ORAL | Status: AC
  Administered 2019-05-25: 11:00:00 20 meq via ORAL

## 2019-05-25 MED ORDER — SODIUM CHLORIDE 0.9 % IV SOLN
Freq: Once | INTRAVENOUS | Status: AC
  Administered 2019-05-25: 08:00:00 via INTRAVENOUS
  Filled 2019-05-25: qty 250

## 2019-05-25 MED ORDER — POTASSIUM CHLORIDE CRYS ER 20 MEQ PO TBCR: 20.0000 meq | EXTENDED_RELEASE_TABLET | Freq: Every day | ORAL | 0 refills | Status: DC

## 2019-05-25 MED ORDER — POTASSIUM CHLORIDE CRYS ER 20 MEQ PO TBCR
EXTENDED_RELEASE_TABLET | ORAL | Status: AC
  Filled 2019-05-25: qty 1

## 2019-05-25 MED ORDER — HEPARIN SOD (PORK) LOCK FLUSH 100 UNIT/ML IV SOLN
500.0000 [IU] | Freq: Once | INTRAVENOUS | Status: AC | PRN
  Administered 2019-05-25: 500 [IU]
  Filled 2019-05-25: qty 5

## 2019-05-25 MED ORDER — SODIUM CHLORIDE 0.9 % IV SOLN
200.0000 mg | Freq: Once | INTRAVENOUS | Status: AC
  Administered 2019-05-25: 11:00:00 200 mg via INTRAVENOUS
  Filled 2019-05-25: qty 8

## 2019-05-25 MED ORDER — SODIUM CHLORIDE 0.9% FLUSH
10.0000 mL | INTRAVENOUS | Status: DC | PRN
  Administered 2019-05-25: 10 mL
  Filled 2019-05-25: qty 10

## 2019-05-25 MED FILL — POTASSIUM CHLORIDE CRYS ER: 20 | 14 days supply | Qty: 14 | Fill #0

## 2019-05-25 NOTE — Progress Notes (Signed)
Per Dr Feng, ok to treat with elevated ALT.  

## 2019-05-27 ENCOUNTER — Other Ambulatory Visit: Payer: Self-pay

## 2019-05-27 DIAGNOSIS — D485 Neoplasm of uncertain behavior of skin: Secondary | ICD-10-CM | POA: Diagnosis not present

## 2019-05-27 LAB — VITAMIN D 25 HYDROXY (VIT D DEFICIENCY, FRACTURES): Vit D, 25-Hydroxy: 34.6 ng/mL (ref 30.0–100.0)

## 2019-06-02 MED FILL — FLUTICASONE PROP 50 MCG SPR: 50 | 30 days supply | Qty: 16 | Fill #1

## 2019-06-10 NOTE — Progress Notes (Signed)
[place]   [phone] [phone]   Clinic Follow up Note   Patient Care Team: [name], MD as PCP - Haskell Riling, MD as Consulting Physician (General Surgery) Truitt Merle, MD as Consulting Physician (Hematology) [name], MD as Attending Physician (Radiation Oncology)  Date of Service:  06/15/2019  CHIEF COMPLAINT: F/u on breast and endometrial cancers  SUMMARY OF ONCOLOGIC HISTORY: Oncology History Overview Note  MSI high Cancer Staging Endometrial cancer [facility]) Staging form: Corpus Uteri - Adenosarcoma, AJCC 8th Edition - Pathologic stage from 06/09/2017: Stage IIIC (pT1a, pN1, cM0) - Signed by Truitt Merle, MD on 06/16/2017  Malignant neoplasm of upper-inner quadrant of right breast in female, estrogen receptor positive ([place]) Staging form: Breast, AJCC 8th Edition - Clinical stage from 05/06/2017: Stage IA (cT1c, cN0, cM0, G2, ER: Positive, PR: Negative, HER2: Positive) - Unsigned - Pathologic stage from 05/25/2017: Stage IIA (pT2, pN0, cM0, G2, ER: Positive, PR: Negative, HER2: Negative) - Signed by Truitt Merle, MD on 06/16/2017     Malignant neoplasm of upper-inner quadrant of right breast in female, estrogen receptor positive ([place])  04/27/2017 Initial Biopsy   Diagnosis Breast, right, needle core biopsy INVASIVE DUCTAL CARCINOMA, GRADE 2 Microscopic Comment The neoplasm has intracellular mucin and signet ring cell features. Immunostains shows these cells are positive for ER,GATA3, ck7 and GCDFP, negative for ck20, cdx2, TTF-1 and pax8, The immunostaining pattern supports the neoplasm is breast primary.   04/27/2017 Receptors her2   Estrogen Receptor: 70%, POSITIVE, MODERATE STAINING INTENSITY Progesterone Receptor: 0%, NEGATIVE Proliferation Marker Ki67: 30% HER2 - **POSITIVE** RATIO OF HER2/CEP17 SIGNALS 2.91 AVERAGE HER2 COPY NUMBER PER CELL 7.28   04/27/2017 Initial Diagnosis   Malignant neoplasm of  upper-inner quadrant of right breast in female, estrogen receptor positive ([place])   05/07/2017 Imaging   CT Chest W Contrast 05/07/17 IMPRESSION: Tiny well-defined fatty lesion on the pleura at the right lung base. The thinner slice collimation used for today's chest CT eliminates volume-averaging seen in the lesion on the prior exam and confirms that this is a diffusely fatty nodule. This is a benign finding and likely represents a tiny lipoma. No defect in the hemidiaphragm evident to suggest tiny diaphragmatic hernia. Pulmonary hamartoma a consideration although the lack of soft tissue components makes this less likely.   05/15/2017 Genetic Testing   Patient had genetic testing due to a personal history of breast cancer and uterine cancer as well as a family history of cancer. The Multi-Cancer panel was ordered. The Multi-Cancer Panel offered by [facility] includes sequencing and/or deletion duplication testing of the following 83 genes: ALK, APC, ATM, AXIN2,BAP1,  BARD1, BLM, BMPR1A, BRCA1, BRCA2, BRIP1, CASR, CDC73, CDH1, CDK4, CDKN1B, CDKN1C, CDKN2A (p14ARF), CDKN2A (p16INK4a), CEBPA, CHEK2, CTNNA1, DICER1, DIS3L2, EGFR (c.2369C>T, p.Thr790Met variant only), EPCAM (Deletion/duplication testing only), FH, FLCN, GATA2, GPC3, GREM1 (Promoter region deletion/duplication testing only), HOXB13 (c.251G>A, p.Gly84Glu), HRAS, KIT, MAX, MEN1, MET, MITF (c.952G>A, p.Glu318Lys variant only), MLH1, MSH2, MSH3, MSH6, MUTYH, NBN, NF1, NF2, NTHL1, PALB2, PDGFRA, PHOX2B, PMS2, POLD1, POLE, POT1, PRKAR1A, PTCH1, PTEN, RAD50, RAD51C, RAD51D, RB1, RECQL4, RET, RUNX1, SDHAF2, SDHA (sequence changes only), SDHB, SDHC, SDHD, SMAD4, SMARCA4, SMARCB1, SMARCE1, STK11, SUFU, TERC, TERT, TMEM127, TP53, TSC1, TSC2, VHL, WRN and WT1.   Results: No pathogenic mutations were identified. A VUS in the GATA2 gene c.1348G>A (p.Gly450Arg) was identified.  The date of this test report is 05/25/2017.     05/25/2017 Surgery   RIGHT  BREAST LUMPECTOMY WITH RADIOACTIVE SEED  AND  RIGHT AXILLARY SENTINEL LYMPH NODE BIOPSY and Pot placement by Dr. Excell Seltzer 05/25/17   05/25/2017 Pathology Results   Diagnosis 05/25/17 1. Breast, lumpectomy, right - INVASIVE DUCTAL CARCINOMA, GRADE II/III, SPANNING 2.2 CM. - DUCTAL CARCINOMA IN SITU, HIGH GRADE. - THE SURGICAL RESECTION MARGINS ARE NEGATIVE FOR CARCINOMA. - SEE ONCOLOGY TABLE BELOW. 2. Breast, excision, right additional superior margin - BENIGN FIBROADIPOSE TISSUE. - BENIGN SKELETAL MUSCLE. - SEE COMMENT. 3. Breast, excision, right additional lateral margin - BENIGN BREAST PARENCHYMA. - THERE IS NO EVIDENCE OF MALIGNANCY. - SEE COMMENT. 4. Breast, excision, chest wall margin - BENIGN FIBROADIPOSE TISSUE. - THERE IS NO EVIDENCE OF MALIGNANCY. - SEE COMMENT. 5. Lymph node, sentinel, biopsy, right axillary - THERE IS NO EVIDENCE OF CARCINOMA IN 1 OF 1 LYMPH NODE (0/1). 6. Lymph node, sentinel, biopsy, right axillary - THERE IS NO EVIDENCE OF CARCINOMA IN 1 OF 1 LYMPH NODE (0/1). 7. Lymph node, sentinel, biopsy, right axillary - THERE IS NO EVIDENCE OF CARCINOMA IN 1 OF 1 LYMPH NODE (0/1).   06/26/2017 PET scan   PET  IMPRESSION: 1. Postoperative findings both in the right breast and in the anatomic pelvis, with associated low-grade activity considered to be postoperative in nature. No hypermetabolic adenopathy or hypermetabolic lesions are identified to suggest active metastatic disease/malignancy. 2. Other imaging findings of potential clinical significance: Aortic Atherosclerosis (ICD10-I70.0). Sigmoid colon diverticulosis. Biapical pleuroparenchymal scarring in the lungs. Small amount of free pelvic fluid in the cul-de-sac, likely postoperative.    07/01/2017 - 10/14/2017 Chemotherapy   Adjuvant TCH with Onpro every 3 weeks for 6 cycles starting on 07/01/17, Changed Taxol to abraxane with cycle 4 due to drug rash reaction, followed by Herceptin every 3 weeks for  6 months    11/01/2017 - 12/08/2017 Radiation Therapy   Radiation therapy to her right breast with [name]   11/04/2017 - 06/2018 Chemotherapy   Maintenance Herceptin starting 11/04/17 and will comeplete her 12 months in 06/2018    12/23/2017 -  Anti-estrogen oral therapy   Adjuvant letrozole 2.5 mg daily started 12/23/17, switched to exemestane on 04/21/18 due to joint pain. Could not afford aromasin, started anastrozole 04/2018.    12/30/2017 Imaging   CT CAP W Contrast 12/30/17 IMPRESSION: Increased size of 1.1 cm aorto-caval retroperitoneal lymph node. This could be reactive due to interval hysterectomy, however metastatic carcinoma cannot be excluded. Consider continued attention on short-term follow-up CT, or PET-CT scan for further evaluation.  Mild hepatic steatosis.   01/25/2018 PET scan   IMPRESSION: 1. Enlarging hypermetabolic aortocaval lymph node is worrisome for metastatic disease. 2. Probable postoperative seroma in the medial right breast, with associated mild inflammatory hypermetabolism.   02/17/2018 -  Antibody Plan   Keytruda every 3 weeks starting 02/17/18. Plan for 2 years    04/28/2018 Mammogram   Probably benign. Calcifications in the right breast most likely are dystrophic, related to previous surgery, and are probably benign.    04/28/2018 Imaging   DEXA Scan T Score -2.0   06/10/2018 Echocardiogram   06/10/2018 ECHO LV EF: 55% -   60%   08/28/2018 - 08/2018 Chemotherapy   Neratinib '4mg'$  for 1 week then titrate up with 1 additional tablet weekly up to 12 mg starting 08/28/18. Stopped soon after starting due to diarrhea.   10/25/2018 Imaging   CT CAP 10/25/18  IMPRESSION: 1. No evidence of metastatic disease. 2.  Aortic atherosclerosis (ICD10-170.0).   Endometrial adenocarcinoma ([place])  05/07/2017 Initial Diagnosis   Endometrial  adenocarcinoma ([place])   06/09/2017 Surgery   XI ROBOTIC ASSISTED TOTAL LAPOROSCOPIC HYSTERECTOMY WITH BILATERAL SALPINGO OOPHORECTOMY  and SENTINEL NODE BIOPSY by Dr. Denman George 06/09/17   06/09/2017 Pathology Results   Diagnosis 06/09/17 1. Lymph node, sentinel, biopsy, right external iliac - METASTATIC ADENOCARCINOMA IN ONE LYMPH NODE (1/1). 2. Lymph node, sentinel, biopsy, left obturator - ONE BENIGN LYMPH NODE (0/1). 3. Lymph node, sentinel, biopsy, left external iliac - METASTATIC ADENOCARCINOMA IN ONE LYMPH NODE (1/1). 4. Uterus +/- tubes/ovaries, neoplastic ENDOMETRIUM: - ENDOMETRIAL ADENOCARCINOMA, 3.2 CM. - CARCINOMA INVADES INNER HALF OF MYOMETRIUM. 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 - CERVIX, BILATERAL FALLOPIAN TUBES AND BILATERAL OVARIES FREE OF TUMOR   08/18/2017 - 09/17/2017 Radiation Therapy   vaginal brachytherapy per 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   02/17/2018 -  Chemotherapy   Keytruda every 3 weeks starting 02/17/18   04/11/2019 Imaging   CT AP W Contrast  IMPRESSION: 1. No evidence of recurrent or metastatic carcinoma within the abdomen or pelvis. 2. Colonic diverticulosis. No radiographic evidence of diverticulitis.        CURRENT THERAPY:  -Keytruda every 3 weeksstarting 02/17/18. -Letrozole 2.'5mg'$  daily starting 12/23/17. Switched to Anastrozole in 04/2018 due to joint pain  INTERVAL HISTORY:  Dawn [name] is here for a follow up and treatment. She presents to the clinic alone.   She is doing well, still works part time. NO pain or other discomfort. Mild arhtrigia.  No other pain or discomfort.  She is little concerned about her overweight onsume alcohol.  Overweight, she feels hungry all the time. He gained about 4 lbs in the past 2 months, no other complains. The ROS otherwise negative    MEDICAL HISTORY:  Past Medical History:  Diagnosis Date   Anemia    as a child.   Arthritis    Bilateral cataracts    Bilateral leg cramps    Cancer  ([place])    skin   Dyspnea    Family history of bladder cancer    Family history of colon cancer in father    Vira Agar' corneal dystrophy    GERD (gastroesophageal reflux disease)    Hearing loss    Right side 30%   History of hiatal hernia    small size   History of radiation therapy 08/21/17, 08/28/17,09/01/17, 09/11/17, 09/17/17   Vaginal cuff brachytherapy.    History of radiation therapy 11/11/17- 12/08/17   Right Breast treated to 40.05 Gy with 15 fx of 2.67 Gy and a boost of 10 Gy with 5 fx.    Hyperlipidemia    Hypertension    IBS (irritable bowel syndrome)    hx of   Malignant neoplasm of upper-inner quadrant of right female breast ([place]) 04/2017   NAFL (nonalcoholic fatty liver)    Obesity    PONV (postoperative nausea and vomiting)    Tuberculosis    tested positive, mother had when patient was child   Uterine cancer ([place]) 04/2017   endometrial cancer   Varices, gastric    Vertigo     SURGICAL HISTORY: Past Surgical History:  Procedure Laterality Date   BREAST BIOPSY Right 04/27/2017   BREAST LUMPECTOMY WITH RADIOACTIVE SEED AND SENTINEL LYMPH NODE BIOPSY Right 05/25/2017   Procedure: RIGHT BREAST LUMPECTOMY WITH RADIOACTIVE SEED AND  RIGHT AXILLARY SENTINEL LYMPH NODE BIOPSY;  Surgeon: Excell Seltzer, MD;  Location: [facility];  Service: General;  Laterality: Right;   CATARACT EXTRACTION, BILATERAL     COLONOSCOPY     HYSTEROSCOPY W/D&C N/A 04/29/2017   Procedure: DILATATION AND CURETTAGE /HYSTEROSCOPY;  Surgeon: Linda Hedges, DO;  Location: [facility];  Service: Gynecology;  Laterality: N/A;   PORTACATH PLACEMENT Left 05/25/2017   Procedure: INSERTION PORT-A-CATH;  Surgeon: Excell Seltzer, MD;  Location: [place];  Service: General;  Laterality: Left;   ROBOTIC ASSISTED TOTAL HYSTERECTOMY WITH BILATERAL SALPINGO OOPHERECTOMY N/A 06/09/2017   Procedure: XI ROBOTIC ASSISTED TOTAL LAPOROSCOPIC HYSTERECTOMY WITH  BILATERAL SALPINGO OOPHORECTOMY;  Surgeon: Everitt Amber, MD;  Location: [facility];  Service: Gynecology;  Laterality: N/A;   SENTINEL NODE BIOPSY N/A 06/09/2017   Procedure: SENTINEL NODE BIOPSY;  Surgeon: Everitt Amber, MD;  Location: [facility];  Service: Gynecology;  Laterality: N/A;    I have reviewed the social history and family history with the patient and they are unchanged from previous note.  ALLERGIES:  is allergic to ciprofloxacin; crestor [rosuvastatin calcium]; fosamax [alendronate sodium]; penicillins; and taxol [paclitaxel].  MEDICATIONS:  Current Outpatient Medications  Medication Sig Dispense Refill   acetaminophen (TYLENOL) 500 MG tablet Take 500 mg by mouth every 6 (six) hours as needed for moderate pain.     anastrozole (ARIMIDEX) 1 MG tablet Take 1 tablet (1 mg total) by mouth daily. 90 tablet 3   Ascorbic Acid (VITAMIN C) 1000 MG tablet Take 1,000 mg by mouth daily.     b complex vitamins capsule Take 1 capsule by mouth daily.     Calcium Carbonate-Vitamin D3 (CALCIUM 600-D) 600-400 MG-UNIT TABS Take 1-2 tablets by mouth daily.     diphenhydrAMINE (BENADRYL) 25 MG tablet Take 25-50 mg by mouth every 6 (six) hours as needed for sleep.     ibandronate (BONIVA) 150 MG tablet Take 1 tablet (150 mg total) by mouth every 30 (thirty) days. 3 tablet 3   ibuprofen (ADVIL,MOTRIN) 200 MG tablet Take 200 mg by mouth every 6 (six) hours as needed.     levothyroxine (SYNTHROID) 50 MCG tablet Take 1 tablet (50 mcg total) by mouth daily. 90 tablet 1   lidocaine-prilocaine (EMLA) cream APPLY TO AFFECTED AREA ONCE 30 g 2   Multiple Vitamin (MULTIVITAMIN) capsule Take 1 capsule by mouth daily.       olmesartan-hydrochlorothiazide (BENICAR HCT) 20-12.5 MG tablet Take 1 tablet by mouth daily. 90 tablet 2   Pitavastatin Calcium (LIVALO) 2 MG TABS Take 1.5 tablets (3 mg total) by mouth every evening. Takes 1.5 tablet 135 tablet 3   prednisoLONE acetate (PRED FORTE) 1 % ophthalmic  suspension Place 1 drop into the left eye 3 (three) times daily.     sodium chloride (MURO 128) 5 % ophthalmic solution Place 2 drops into both eyes daily.     tiZANidine (ZANAFLEX) 2 MG tablet Take 1 tablet (2 mg total) by mouth at bedtime as needed for muscle spasms. 30 tablet 1   triamcinolone cream (KENALOG) 0.1 % APPLY TO ITCHY SPOTS ONCE DAILY  1   Current Facility-Administered Medications  Medication Dose Route Frequency Provider Last Rate Last Dose   influenza vaccine adjuvanted (FLUAD) injection 0.5 mL  0.5 mL Intramuscular Once Truitt Merle, MD       Facility-Administered Medications Ordered in Other Visits  Medication Dose Route Frequency Provider Last Rate Last Dose   heparin lock flush 100 unit/mL  500 Units Intracatheter Once PRN Truitt Merle, MD  influenza vaccine adjuvanted (FLUAD) injection 0.5 mL  0.5 mL Intramuscular Once Truitt Merle, MD       pembrolizumab [facility]) 200 mg in sodium chloride 0.9 % 50 mL chemo infusion  200 mg Intravenous Once Truitt Merle, MD       sodium chloride flush (NS) 0.9 % injection 10 mL  10 mL Intracatheter PRN Truitt Merle, MD   10 mL at 08/25/18 1111   sodium chloride flush (NS) 0.9 % injection 10 mL  10 mL Intracatheter PRN Truitt Merle, MD        PHYSICAL EXAMINATION: ECOG PERFORMANCE STATUS: 0 - Asymptomatic  Vitals:   06/15/19 0822  BP: (!) 145/74  Pulse: 73  Resp: 18  Temp: 97.8 F (36.6 C)  SpO2: 94%   Filed Weights   06/15/19 0822  Weight: 194 lb 14.4 oz (88.4 kg)   GENERAL:alert, no distress and comfortable SKIN: skin color, texture, turgor are normal, no rashes or significant lesions EYES: normal, Conjunctiva are pink and non-injected, sclera clear NECK: supple, thyroid normal size, non-tender, without nodularity LYMPH:  no palpable lymphadenopathy in the cervical, axillary  LUNGS: clear to auscultation and percussion with normal breathing effort HEART: regular rate & rhythm and no murmurs and no lower extremity  edema ABDOMEN:abdomen soft, non-tender and normal bowel sounds Musculoskeletal:no cyanosis of digits and no clubbing  NEURO: alert & oriented x 3 with fluent speech, no focal motor/sensory deficits Breasts: Breast inspection showed them to be symmetrical with no nipple discharge. Palpation of the breasts and axilla revealed no obvious mass that I could appreciate except a 0.5cm nodule at 1:00 position 7cm from nipple.  Surgical scar at right axilla and around the nipple has healed very well.   LABORATORY DATA:  I have reviewed the data as listed CBC Latest Ref Rng & Units 06/15/2019 05/25/2019 04/11/2019  WBC 4.0 - 10.5 K/uL 4.0 4.2 4.7  Hemoglobin 12.0 - 15.0 g/dL 13.8 13.5 14.1  Hematocrit 36.0 - 46.0 % 39.0 38.9 40.1  Platelets 150 - 400 K/uL 162 149(L) 158     CMP Latest Ref Rng & Units 06/15/2019 05/25/2019 04/11/2019  Glucose 70 - 99 mg/dL 169(H) 146(H) 104(H)  BUN 8 - 23 mg/dL '10 13 10  '$ Creatinine 0.44 - 1.00 mg/dL 0.80 0.76 0.78  Sodium 135 - 145 mmol/L 137 139 136  Potassium 3.5 - 5.1 mmol/L 3.3(L) 3.3(L) 3.6  Chloride 98 - 111 mmol/L 98 102 98  CO2 22 - 32 mmol/L '27 26 28  '$ Calcium 8.9 - 10.3 mg/dL 9.0 8.9 9.3  Total Protein 6.5 - 8.1 g/dL 7.1 6.9 7.3  Total Bilirubin 0.3 - 1.2 mg/dL 0.8 0.6 0.8  Alkaline Phos 38 - 126 U/L 43 50 44  AST 15 - 41 U/L 71(H) 65(H) 56(H)  ALT 0 - 44 U/L 111(H) 104(H) 92(H)      RADIOGRAPHIC STUDIES: I have personally reviewed the radiological images as listed and agreed with the findings in the report. No results found.   ASSESSMENT & PLAN:  Dawn [name] is a 68 y.o. female with   1. 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 Treated with right breast lumpectomy, adjuvant chemo and radiation.She tried Nerlynx but could not tolerate due to diarrhea, and does not want try again. -She started anti-estrogen therapy with letrozole. Due to joint pain I  switched her to Anastrozole in 04/2018. Tolerating well. -She is clinically doing well and stable.  Exam showed a small nodule at 1 o'clock position of right breast, possible related to her previous breast surgery.  Recent breast MRI was negative, I will get a right breast ultrasound for further evaluation. -Continue anastrozole -F/u in6weeks  2. Endometrial adenocarcinomawith lymph node metastasis, pT1apN1M0, FIGO stage IIIc, MSI-H, probable RP node recurrence in 12/2017 -Diagnosed in 04/2017. Treated with hysterectomy, BSO, adjuvant radiation, and chemo with Carbotaxol. Currently on Keytruda every 3 weeksfor presumed retroperitoneal lymph node metastasis since 02/17/18. Tolerating well. She will continue to complete 2 years treatment.If noevidence of progression, plan to complete in 02/2020. -Given recent occasional pressure in her lower abdomen she plans to return to her Gyn for pap smear. Last was over 1 year ago.  -Her last her CT AP from 04/11/19 shows no evidence of recurrent or metastatic carcinoma. Repeat scan in 6 months  -Lab reviewed, she has stable mild transaminitis, no other concerns, will proceed Keytruda today  3. HTN -Controlled, Continue tof/u with PCP -well controlled   4. Transaminitis -Secondary to fatty liver disease -Stable   5.Osteopenia -DEXA scan in 05/2018 revealed T score of -2.0. -She is on monthly boniva, prescribed by her PCP, since 02/2018.Continue -ContinueVitamin D and Calcium supplements. -will check her Vitamin D level every 6 months.   6.Metabolic syndrome, hyperglycemia  -She has no history of diabetes. Her random blood glucose was 213, we discussed diabetic diet and exercise -She was recently found to have abnormal TSH levels. She has been seen by endocrinologist and has startedlevothyroxineon 01/04/19. Will monitor TSH level every 3 weeks.  -Her weight does continue to trend up. I encouraged her to remainactive. -Latest TSH at  1.615 (03/23/19), HB A1c at 5.4 (02/09/19) -Continue to follow-up with her primary care physician  7. 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 Hypokalemia -she finished a course of K replacement, K 3.3 again today -will call in KCL 52mq daily    [facility] the [facility] -Labs reviewed, will proceed with treatment today and continue every 3 weeks -Follow-up in 6 weeks  No problem-specific Assessment & Plan notes found for this encounter.   Orders Placed This Encounter  Procedures   [place]AST LTD UNI RIGHT INC AXILLA    Standing Status:   Future    Standing Expiration Date:   08/14/2020    Scheduling Instructions:     [facility]    Order Specific Question:   Reason for Exam (SYMPTOM  OR DIAGNOSIS REQUIRED)    Answer:   evaluate a small palpable breast nodule at 1:00 of right breast    Order Specific Question:   Preferred imaging location?    Answer:   External   All questions were answered. The patient knows to call the clinic with any problems, questions or concerns. No barriers to learning was detected. I spent 20 minutes counseling the patient face to face. The total time spent in the appointment was 25 minutes and more than 50% was on counseling and review of test results     YTruitt Merle MD 06/15/2019   I, AJoslyn Devon am acting as scribe for YTruitt Merle MD.   I have reviewed the above documentation for accuracy and completeness, and I agree with the above.

## 2019-06-15 ENCOUNTER — Other Ambulatory Visit: Payer: Self-pay

## 2019-06-15 ENCOUNTER — Inpatient Hospital Stay (HOSPITAL_BASED_OUTPATIENT_CLINIC_OR_DEPARTMENT_OTHER): Payer: Medicare Other | Admitting: Hematology

## 2019-06-15 ENCOUNTER — Inpatient Hospital Stay: Payer: Medicare Other

## 2019-06-15 ENCOUNTER — Other Ambulatory Visit: Payer: Self-pay | Admitting: Radiology

## 2019-06-15 ENCOUNTER — Telehealth: Payer: Self-pay | Admitting: Hematology

## 2019-06-15 ENCOUNTER — Other Ambulatory Visit: Payer: Self-pay | Admitting: Hematology

## 2019-06-15 ENCOUNTER — Encounter: Payer: Self-pay | Admitting: Hematology

## 2019-06-15 VITALS — BP 145/74 | HR 73 | Temp 97.8°F | Resp 18 | Ht 63.0 in | Wt 194.9 lb

## 2019-06-15 DIAGNOSIS — M810 Age-related osteoporosis without current pathological fracture: Secondary | ICD-10-CM

## 2019-06-15 DIAGNOSIS — C541 Malignant neoplasm of endometrium: Secondary | ICD-10-CM

## 2019-06-15 DIAGNOSIS — Z95828 Presence of other vascular implants and grafts: Secondary | ICD-10-CM

## 2019-06-15 DIAGNOSIS — Z170 Estrogen receptor positive status [ER+]: Secondary | ICD-10-CM

## 2019-06-15 DIAGNOSIS — Z23 Encounter for immunization: Secondary | ICD-10-CM

## 2019-06-15 DIAGNOSIS — Z79811 Long term (current) use of aromatase inhibitors: Secondary | ICD-10-CM | POA: Diagnosis not present

## 2019-06-15 DIAGNOSIS — C50211 Malignant neoplasm of upper-inner quadrant of right female breast: Secondary | ICD-10-CM

## 2019-06-15 DIAGNOSIS — Z8542 Personal history of malignant neoplasm of other parts of uterus: Secondary | ICD-10-CM | POA: Diagnosis not present

## 2019-06-15 DIAGNOSIS — N6031 Fibrosclerosis of right breast: Secondary | ICD-10-CM | POA: Diagnosis not present

## 2019-06-15 DIAGNOSIS — Z853 Personal history of malignant neoplasm of breast: Secondary | ICD-10-CM | POA: Diagnosis not present

## 2019-06-15 DIAGNOSIS — N6312 Unspecified lump in the right breast, upper inner quadrant: Secondary | ICD-10-CM | POA: Diagnosis not present

## 2019-06-15 DIAGNOSIS — Z8041 Family history of malignant neoplasm of ovary: Secondary | ICD-10-CM | POA: Diagnosis not present

## 2019-06-15 DIAGNOSIS — Z5112 Encounter for antineoplastic immunotherapy: Secondary | ICD-10-CM | POA: Diagnosis not present

## 2019-06-15 LAB — CBC WITH DIFFERENTIAL (CANCER CENTER ONLY)
Abs Immature Granulocytes: 0 10*3/uL (ref 0.00–0.07)
Basophils Absolute: 0 10*3/uL (ref 0.0–0.1)
Basophils Relative: 1 %
Eosinophils Absolute: 0.1 10*3/uL (ref 0.0–0.5)
Eosinophils Relative: 3 %
HCT: 39 % (ref 36.0–46.0)
Hemoglobin: 13.8 g/dL (ref 12.0–15.0)
Immature Granulocytes: 0 %
Lymphocytes Relative: 45 %
Lymphs Abs: 1.8 10*3/uL (ref 0.7–4.0)
MCH: 35.5 pg — ABNORMAL HIGH (ref 26.0–34.0)
MCHC: 35.4 g/dL (ref 30.0–36.0)
MCV: 100.3 fL — ABNORMAL HIGH (ref 80.0–100.0)
Monocytes Absolute: 0.3 10*3/uL (ref 0.1–1.0)
Monocytes Relative: 8 %
Neutro Abs: 1.7 10*3/uL (ref 1.7–7.7)
Neutrophils Relative %: 43 %
Platelet Count: 162 10*3/uL (ref 150–400)
RBC: 3.89 MIL/uL (ref 3.87–5.11)
RDW: 11.8 % (ref 11.5–15.5)
WBC Count: 4 10*3/uL (ref 4.0–10.5)
nRBC: 0 % (ref 0.0–0.2)

## 2019-06-15 LAB — CMP (CANCER CENTER ONLY)
ALT: 111 U/L — ABNORMAL HIGH (ref 0–44)
AST: 71 U/L — ABNORMAL HIGH (ref 15–41)
Albumin: 3.9 g/dL (ref 3.5–5.0)
Alkaline Phosphatase: 43 U/L (ref 38–126)
Anion gap: 12 (ref 5–15)
BUN: 10 mg/dL (ref 8–23)
CO2: 27 mmol/L (ref 22–32)
Calcium: 9 mg/dL (ref 8.9–10.3)
Chloride: 98 mmol/L (ref 98–111)
Creatinine: 0.8 mg/dL (ref 0.44–1.00)
GFR, Est AFR Am: >60 mL/min (ref >60)
GFR, Estimated: >60 mL/min (ref >60)
Glucose, Bld: 169 mg/dL — ABNORMAL HIGH (ref 70–99)
Potassium: 3.3 mmol/L — ABNORMAL LOW (ref 3.5–5.1)
Sodium: 137 mmol/L (ref 135–145)
Total Bilirubin: 0.8 mg/dL (ref 0.3–1.2)
Total Protein: 7.1 g/dL (ref 6.5–8.1)

## 2019-06-15 MED ORDER — HEPARIN SOD (PORK) LOCK FLUSH 100 UNIT/ML IV SOLN
500.0000 [IU] | Freq: Once | INTRAVENOUS | Status: AC | PRN
  Administered 2019-06-15: 500 [IU]
  Filled 2019-06-15: qty 5

## 2019-06-15 MED ORDER — SODIUM CHLORIDE 0.9% FLUSH
10.0000 mL | INTRAVENOUS | Status: DC | PRN
  Administered 2019-06-15: 10 mL
  Filled 2019-06-15: qty 10

## 2019-06-15 MED ORDER — POTASSIUM CHLORIDE CRYS ER 20 MEQ PO TBCR: 20.0000 meq | EXTENDED_RELEASE_TABLET | Freq: Every day | ORAL | 2 refills | Status: DC

## 2019-06-15 MED ORDER — INFLUENZA VAC A&B SA ADJ QUAD 0.5 ML IM PRSY
PREFILLED_SYRINGE | INTRAMUSCULAR | Status: AC
  Filled 2019-06-15: qty 0.5

## 2019-06-15 MED ORDER — SODIUM CHLORIDE 0.9 % IV SOLN
Freq: Once | INTRAVENOUS | Status: AC
  Administered 2019-06-15: 09:00:00 via INTRAVENOUS
  Filled 2019-06-15: qty 250

## 2019-06-15 MED ORDER — SODIUM CHLORIDE 0.9% FLUSH
10.0000 mL | INTRAVENOUS | Status: DC | PRN
  Administered 2019-06-15: 10 mL via INTRAVENOUS
  Filled 2019-06-15: qty 10

## 2019-06-15 MED ORDER — INFLUENZA VAC A&B SA ADJ QUAD 0.5 ML IM PRSY: 0.5000 mL | PREFILLED_SYRINGE | Freq: Once | INTRAMUSCULAR | Status: DC

## 2019-06-15 MED ORDER — INFLUENZA VAC A&B SA ADJ QUAD 0.5 ML IM PRSY
0.5000 mL | PREFILLED_SYRINGE | Freq: Once | INTRAMUSCULAR | Status: AC
  Administered 2019-06-15: 10:00:00 0.5 mL via INTRAMUSCULAR

## 2019-06-15 MED ORDER — SODIUM CHLORIDE 0.9 % IV SOLN
200.0000 mg | Freq: Once | INTRAVENOUS | Status: AC
  Administered 2019-06-15: 200 mg via INTRAVENOUS
  Filled 2019-06-15: qty 8

## 2019-06-15 MED FILL — POTASSIUM CHLORIDE CRYS ER: 20 | 30 days supply | Qty: 30 | Fill #0

## 2019-06-15 NOTE — Telephone Encounter (Signed)
Scheduled appt per 9/30 los.  Sent a secure chat to get her appt calendar taken to her while she is in treatment

## 2019-06-15 NOTE — Progress Notes (Signed)
Per [name], ok to treat with ALT 111 and K of 3.3. [name] to refill oral potassium supplements.

## 2019-06-15 NOTE — Patient Instructions (Addendum)
[facility] Discharge Instructions for Patients Receiving Chemotherapy  Today you received the following chemotherapy agents Keytruda.  To help prevent nausea and vomiting after your treatment, we encourage you to take your nausea medication as directed.  If you develop nausea and vomiting that is not controlled by your nausea medication, call the clinic.   BELOW ARE SYMPTOMS THAT SHOULD BE REPORTED IMMEDIATELY:  *FEVER GREATER THAN 100.5 F  *CHILLS WITH OR WITHOUT FEVER  NAUSEA AND VOMITING THAT IS NOT CONTROLLED WITH YOUR NAUSEA MEDICATION  *UNUSUAL SHORTNESS OF BREATH  *UNUSUAL BRUISING OR BLEEDING  TENDERNESS IN MOUTH AND THROAT WITH OR WITHOUT PRESENCE OF ULCERS  *URINARY PROBLEMS  *BOWEL PROBLEMS  UNUSUAL RASH Items with * indicate a potential emergency and should be followed up as soon as possible.  Feel free to call the clinic should you have any questions or concerns. The clinic phone number is [phone].  Please show the CHEMO ALERT CARD at check-in to the Emergency Department and triage nurse.  Influenza Virus Vaccine injection What is this medicine? INFLUENZA VIRUS VACCINE (in floo EN zuh VAHY ruhs vak SEEN) helps to reduce the risk of getting influenza also known as the flu. The vaccine only helps protect you against some strains of the flu. This medicine may be used for other purposes; ask your health care provider or pharmacist if you have questions. COMMON BRAND NAME(S): Afluria, Afluria Quadrivalent, Agriflu, Alfuria, FLUAD, Fluarix, Fluarix Quadrivalent, Flublok, Flublok Quadrivalent, FLUCELVAX, Flulaval, Fluvirin, Fluzone, Fluzone High-Dose, Fluzone Intradermal What should I tell my health care provider before I take this medicine? They need to know if you have any of these conditions:  bleeding disorder like hemophilia  fever or infection  Guillain-Barre syndrome or other neurological problems  immune system problems  infection  with the human immunodeficiency virus (HIV) or AIDS  low blood platelet counts  multiple sclerosis  an unusual or allergic reaction to influenza virus vaccine, latex, other medicines, foods, dyes, or preservatives. Different brands of vaccines contain different allergens. Some may contain latex or eggs. Talk to your doctor about your allergies to make sure that you get the right vaccine.  pregnant or trying to get pregnant  breast-feeding How should I use this medicine? This vaccine is for injection into a muscle or under the skin. It is given by a health care professional. A copy of Vaccine Information Statements will be given before each vaccination. Read this sheet carefully each time. The sheet may change frequently. Talk to your healthcare provider to see which vaccines are right for you. Some vaccines should not be used in all age groups. Overdosage: If you think you have taken too much of this medicine contact a poison control center or emergency room at once. NOTE: This medicine is only for you. Do not share this medicine with others. What if I miss a dose? This does not apply. What may interact with this medicine?  chemotherapy or radiation therapy  medicines that lower your immune system like etanercept, anakinra, infliximab, and adalimumab  medicines that treat or prevent blood clots like warfarin  phenytoin  steroid medicines like prednisone or cortisone  theophylline  vaccines This list may not describe all possible interactions. Give your health care provider a list of all the medicines, herbs, non-prescription drugs, or dietary supplements you use. Also tell them if you smoke, drink alcohol, or use illegal drugs. Some items may interact with your medicine. What should I watch for while using this medicine?   Report any side effects that do not go away within 3 days to your doctor or health care professional. Call your health care provider if any unusual symptoms occur  within 6 weeks of receiving this vaccine. You may still catch the flu, but the illness is not usually as bad. You cannot get the flu from the vaccine. The vaccine will not protect against colds or other illnesses that may cause fever. The vaccine is needed every year. What side effects may I notice from receiving this medicine? Side effects that you should report to your doctor or health care professional as soon as possible:  allergic reactions like skin rash, itching or hives, swelling of the face, lips, or tongue Side effects that usually do not require medical attention (report to your doctor or health care professional if they continue or are bothersome):  fever  headache  muscle aches and pains  pain, tenderness, redness, or swelling at the injection site  tiredness This list may not describe all possible side effects. Call your doctor for medical advice about side effects. You may report side effects to FDA at 1-800-FDA-1088. Where should I keep my medicine? The vaccine will be given by a health care professional in a clinic, pharmacy, doctor's office, or other health care setting. You will not be given vaccine doses to store at home. NOTE: This sheet is a summary. It may not cover all possible information. If you have questions about this medicine, talk to your doctor, pharmacist, or health care provider.  2020 [employer]vier/Gold Standard (2018-07-27 08:45:43)  

## 2019-06-15 NOTE — Telephone Encounter (Signed)
Called in Potassium 20 meq take one by mouth daily #30 with 2 refills to [facility] pharmacy per patient's request.

## 2019-06-16 ENCOUNTER — Telehealth: Payer: Self-pay

## 2019-06-16 NOTE — Telephone Encounter (Signed)
Faxed back medical clarification to [facility] for Potassium, sent to HIM for scan to chart.  Was faxed to [phone]  Order [id number]

## 2019-07-03 ENCOUNTER — Other Ambulatory Visit: Payer: Self-pay | Admitting: Hematology

## 2019-07-03 ENCOUNTER — Other Ambulatory Visit: Payer: Self-pay | Admitting: Family Medicine

## 2019-07-04 DIAGNOSIS — Z6834 Body mass index (BMI) 34.0-34.9, adult: Secondary | ICD-10-CM | POA: Diagnosis not present

## 2019-07-04 DIAGNOSIS — R350 Frequency of micturition: Secondary | ICD-10-CM | POA: Diagnosis not present

## 2019-07-04 DIAGNOSIS — Z124 Encounter for screening for malignant neoplasm of cervix: Secondary | ICD-10-CM | POA: Diagnosis not present

## 2019-07-04 DIAGNOSIS — Z779 Other contact with and (suspected) exposures hazardous to health: Secondary | ICD-10-CM | POA: Diagnosis not present

## 2019-07-05 ENCOUNTER — Other Ambulatory Visit: Payer: Self-pay | Admitting: *Deleted

## 2019-07-05 MED ORDER — LIVALO 2 MG PO TABS: 3.0000 mg | ORAL_TABLET | Freq: Every evening | ORAL | 3 refills | Status: DC

## 2019-07-06 ENCOUNTER — Other Ambulatory Visit: Payer: Self-pay

## 2019-07-06 ENCOUNTER — Inpatient Hospital Stay: Payer: Medicare Other | Attending: Hematology

## 2019-07-06 ENCOUNTER — Inpatient Hospital Stay: Payer: Medicare Other

## 2019-07-06 VITALS — BP 137/76 | HR 77 | Temp 97.8°F | Resp 16 | Wt 193.5 lb

## 2019-07-06 DIAGNOSIS — Z5112 Encounter for antineoplastic immunotherapy: Secondary | ICD-10-CM | POA: Insufficient documentation

## 2019-07-06 DIAGNOSIS — C541 Malignant neoplasm of endometrium: Secondary | ICD-10-CM

## 2019-07-06 DIAGNOSIS — C50211 Malignant neoplasm of upper-inner quadrant of right female breast: Secondary | ICD-10-CM | POA: Diagnosis not present

## 2019-07-06 DIAGNOSIS — Z170 Estrogen receptor positive status [ER+]: Secondary | ICD-10-CM | POA: Diagnosis not present

## 2019-07-06 DIAGNOSIS — Z95828 Presence of other vascular implants and grafts: Secondary | ICD-10-CM

## 2019-07-06 LAB — CMP (CANCER CENTER ONLY)
ALT: 101 U/L — ABNORMAL HIGH (ref 0–44)
AST: 67 U/L — ABNORMAL HIGH (ref 15–41)
Albumin: 3.8 g/dL (ref 3.5–5.0)
Alkaline Phosphatase: 37 U/L — ABNORMAL LOW (ref 38–126)
Anion gap: 13 (ref 5–15)
BUN: 12 mg/dL (ref 8–23)
CO2: 24 mmol/L (ref 22–32)
Calcium: 9.2 mg/dL (ref 8.9–10.3)
Chloride: 100 mmol/L (ref 98–111)
Creatinine: 0.79 mg/dL (ref 0.44–1.00)
GFR, Est AFR Am: >60 mL/min (ref >60)
GFR, Estimated: >60 mL/min (ref >60)
Glucose, Bld: 138 mg/dL — ABNORMAL HIGH (ref 70–99)
Potassium: 3.9 mmol/L (ref 3.5–5.1)
Sodium: 137 mmol/L (ref 135–145)
Total Bilirubin: 0.8 mg/dL (ref 0.3–1.2)
Total Protein: 7 g/dL (ref 6.5–8.1)

## 2019-07-06 LAB — CBC WITH DIFFERENTIAL (CANCER CENTER ONLY)
Abs Immature Granulocytes: 0.01 10*3/uL (ref 0.00–0.07)
Basophils Absolute: 0 10*3/uL (ref 0.0–0.1)
Basophils Relative: 1 %
Eosinophils Absolute: 0.2 10*3/uL (ref 0.0–0.5)
Eosinophils Relative: 4 %
HCT: 39.5 % (ref 36.0–46.0)
Hemoglobin: 13.7 g/dL (ref 12.0–15.0)
Immature Granulocytes: 0 %
Lymphocytes Relative: 44 %
Lymphs Abs: 1.9 10*3/uL (ref 0.7–4.0)
MCH: 35 pg — ABNORMAL HIGH (ref 26.0–34.0)
MCHC: 34.7 g/dL (ref 30.0–36.0)
MCV: 101 fL — ABNORMAL HIGH (ref 80.0–100.0)
Monocytes Absolute: 0.4 10*3/uL (ref 0.1–1.0)
Monocytes Relative: 11 %
Neutro Abs: 1.6 10*3/uL — ABNORMAL LOW (ref 1.7–7.7)
Neutrophils Relative %: 40 %
Platelet Count: 150 10*3/uL (ref 150–400)
RBC: 3.91 MIL/uL (ref 3.87–5.11)
RDW: 11.9 % (ref 11.5–15.5)
WBC Count: 4.2 10*3/uL (ref 4.0–10.5)
nRBC: 0 % (ref 0.0–0.2)

## 2019-07-06 MED ORDER — SODIUM CHLORIDE 0.9 % IV SOLN
Freq: Once | INTRAVENOUS | Status: AC
  Administered 2019-07-06: 10:00:00 via INTRAVENOUS
  Filled 2019-07-06: qty 250

## 2019-07-06 MED ORDER — HEPARIN SOD (PORK) LOCK FLUSH 100 UNIT/ML IV SOLN
500.0000 [IU] | Freq: Once | INTRAVENOUS | Status: AC | PRN
  Administered 2019-07-06: 500 [IU]
  Filled 2019-07-06: qty 5

## 2019-07-06 MED ORDER — SODIUM CHLORIDE 0.9 % IV SOLN
200.0000 mg | Freq: Once | INTRAVENOUS | Status: AC
  Administered 2019-07-06: 200 mg via INTRAVENOUS
  Filled 2019-07-06: qty 8

## 2019-07-06 MED ORDER — SODIUM CHLORIDE 0.9% FLUSH
10.0000 mL | Freq: Once | INTRAVENOUS | Status: AC
  Administered 2019-07-06: 09:00:00 10 mL
  Filled 2019-07-06: qty 10

## 2019-07-06 MED ORDER — SODIUM CHLORIDE 0.9% FLUSH
10.0000 mL | INTRAVENOUS | Status: DC | PRN
  Administered 2019-07-06: 10 mL
  Filled 2019-07-06: qty 10

## 2019-07-06 NOTE — Progress Notes (Signed)
Per [name], okay for patient to receive treatment today with ALT 101.

## 2019-07-06 NOTE — Patient Instructions (Signed)
[facility] Discharge Instructions for Patients Receiving Chemotherapy  Today you received the following chemotherapy agents Keytruda.  To help prevent nausea and vomiting after your treatment, we encourage you to take your nausea medication as directed.  If you develop nausea and vomiting that is not controlled by your nausea medication, call the clinic.   BELOW ARE SYMPTOMS THAT SHOULD BE REPORTED IMMEDIATELY:  *FEVER GREATER THAN 100.5 F  *CHILLS WITH OR WITHOUT FEVER  NAUSEA AND VOMITING THAT IS NOT CONTROLLED WITH YOUR NAUSEA MEDICATION  *UNUSUAL SHORTNESS OF BREATH  *UNUSUAL BRUISING OR BLEEDING  TENDERNESS IN MOUTH AND THROAT WITH OR WITHOUT PRESENCE OF ULCERS  *URINARY PROBLEMS  *BOWEL PROBLEMS  UNUSUAL RASH Items with * indicate a potential emergency and should be followed up as soon as possible.  Feel free to call the clinic should you have any questions or concerns. The clinic phone number is [phone].  Please show the CHEMO ALERT CARD at check-in to the Emergency Department and triage nurse.  Influenza Virus Vaccine injection What is this medicine? INFLUENZA VIRUS VACCINE (in floo EN zuh VAHY ruhs vak SEEN) helps to reduce the risk of getting influenza also known as the flu. The vaccine only helps protect you against some strains of the flu. This medicine may be used for other purposes; ask your health care provider or pharmacist if you have questions. COMMON BRAND NAME(S): Afluria, Afluria Quadrivalent, Agriflu, Alfuria, FLUAD, Fluarix, Fluarix Quadrivalent, Flublok, Flublok Quadrivalent, FLUCELVAX, Flulaval, Fluvirin, Fluzone, Fluzone High-Dose, Fluzone Intradermal What should I tell my health care provider before I take this medicine? They need to know if you have any of these conditions:  bleeding disorder like hemophilia  fever or infection  Guillain-Barre syndrome or other neurological problems  immune system problems  infection  with the human immunodeficiency virus (HIV) or AIDS  low blood platelet counts  multiple sclerosis  an unusual or allergic reaction to influenza virus vaccine, latex, other medicines, foods, dyes, or preservatives. Different brands of vaccines contain different allergens. Some may contain latex or eggs. Talk to your doctor about your allergies to make sure that you get the right vaccine.  pregnant or trying to get pregnant  breast-feeding How should I use this medicine? This vaccine is for injection into a muscle or under the skin. It is given by a health care professional. A copy of Vaccine Information Statements will be given before each vaccination. Read this sheet carefully each time. The sheet may change frequently. Talk to your healthcare provider to see which vaccines are right for you. Some vaccines should not be used in all age groups. Overdosage: If you think you have taken too much of this medicine contact a poison control center or emergency room at once. NOTE: This medicine is only for you. Do not share this medicine with others. What if I miss a dose? This does not apply. What may interact with this medicine?  chemotherapy or radiation therapy  medicines that lower your immune system like etanercept, anakinra, infliximab, and adalimumab  medicines that treat or prevent blood clots like warfarin  phenytoin  steroid medicines like prednisone or cortisone  theophylline  vaccines This list may not describe all possible interactions. Give your health care provider a list of all the medicines, herbs, non-prescription drugs, or dietary supplements you use. Also tell them if you smoke, drink alcohol, or use illegal drugs. Some items may interact with your medicine. What should I watch for while using this medicine?   Report any side effects that do not go away within 3 days to your doctor or health care professional. Call your health care provider if any unusual symptoms occur  within 6 weeks of receiving this vaccine. You may still catch the flu, but the illness is not usually as bad. You cannot get the flu from the vaccine. The vaccine will not protect against colds or other illnesses that may cause fever. The vaccine is needed every year. What side effects may I notice from receiving this medicine? Side effects that you should report to your doctor or health care professional as soon as possible:  allergic reactions like skin rash, itching or hives, swelling of the face, lips, or tongue Side effects that usually do not require medical attention (report to your doctor or health care professional if they continue or are bothersome):  fever  headache  muscle aches and pains  pain, tenderness, redness, or swelling at the injection site  tiredness This list may not describe all possible side effects. Call your doctor for medical advice about side effects. You may report side effects to FDA at 1-800-FDA-1088. Where should I keep my medicine? The vaccine will be given by a health care professional in a clinic, pharmacy, doctor's office, or other health care setting. You will not be given vaccine doses to store at home. NOTE: This sheet is a summary. It may not cover all possible information. If you have questions about this medicine, talk to your doctor, pharmacist, or health care provider.  2020 [employer]vier/Gold Standard (2018-07-27 08:45:43)  

## 2019-07-22 NOTE — Progress Notes (Signed)
[place]   [phone] Fax:([phone]) [phone]   Clinic Follow up Note   Patient Care Team: [name], MD as PCP - Haskell Riling, MD as Consulting Physician (General Surgery) Truitt Merle, MD as Consulting Physician (Hematology) [name], MD as Attending Physician (Radiation Oncology)  Date of Service:  07/27/2019  CHIEF COMPLAINT: F/u on breast and endometrial cancers  SUMMARY OF ONCOLOGIC HISTORY: Oncology History Overview Note  MSI high Cancer Staging Endometrial cancer [facility] Staging form: Corpus Uteri - Adenosarcoma, AJCC 8th Edition - Pathologic stage from 06/09/2017: Stage IIIC (pT1a, pN1, cM0) - Signed by Truitt Merle, MD on 06/16/2017  Malignant neoplasm of upper-inner quadrant of right breast in female, estrogen receptor positive ([place]) Staging form: Breast, AJCC 8th Edition - Clinical stage from 05/06/2017: Stage IA (cT1c, cN0, cM0, G2, ER: Positive, PR: Negative, HER2: Positive) - Unsigned - Pathologic stage from 05/25/2017: Stage IIA (pT2, pN0, cM0, G2, ER: Positive, PR: Negative, HER2: Negative) - Signed by Truitt Merle, MD on 06/16/2017     Malignant neoplasm of upper-inner quadrant of right breast in female, estrogen receptor positive ([place])  04/27/2017 Initial Biopsy   Diagnosis Breast, right, needle core biopsy INVASIVE DUCTAL CARCINOMA, GRADE 2 Microscopic Comment The neoplasm has intracellular mucin and signet ring cell features. Immunostains shows these cells are positive for ER,GATA3, ck7 and GCDFP, negative for ck20, cdx2, TTF-1 and pax8, The immunostaining pattern supports the neoplasm is breast primary.   04/27/2017 Receptors her2   Estrogen Receptor: 70%, POSITIVE, MODERATE STAINING INTENSITY Progesterone Receptor: 0%, NEGATIVE Proliferation Marker Ki67: 30% HER2 - **POSITIVE** RATIO OF HER2/CEP17 SIGNALS 2.91 AVERAGE HER2 COPY NUMBER PER CELL 7.28   04/27/2017 Initial Diagnosis   Malignant neoplasm of  upper-inner quadrant of right breast in female, estrogen receptor positive ([place])   05/07/2017 Imaging   CT Chest W Contrast 05/07/17 IMPRESSION: Tiny well-defined fatty lesion on the pleura at the right lung base. The thinner slice collimation used for today's chest CT eliminates volume-averaging seen in the lesion on the prior exam and confirms that this is a diffusely fatty nodule. This is a benign finding and likely represents a tiny lipoma. No defect in the hemidiaphragm evident to suggest tiny diaphragmatic hernia. Pulmonary hamartoma a consideration although the lack of soft tissue components makes this less likely.   05/15/2017 Genetic Testing   Patient had genetic testing due to a personal history of breast cancer and uterine cancer as well as a family history of cancer. The Multi-Cancer panel was ordered. The Multi-Cancer Panel offered by [facility] includes sequencing and/or deletion duplication testing of the following 83 genes: ALK, APC, ATM, AXIN2,BAP1,  BARD1, BLM, BMPR1A, BRCA1, BRCA2, BRIP1, CASR, CDC73, CDH1, CDK4, CDKN1B, CDKN1C, CDKN2A (p14ARF), CDKN2A (p16INK4a), CEBPA, CHEK2, CTNNA1, DICER1, DIS3L2, EGFR (c.2369C>T, p.Thr790Met variant only), EPCAM (Deletion/duplication testing only), FH, FLCN, GATA2, GPC3, GREM1 (Promoter region deletion/duplication testing only), HOXB13 (c.251G>A, p.Gly84Glu), HRAS, KIT, MAX, MEN1, MET, MITF (c.952G>A, p.Glu318Lys variant only), MLH1, MSH2, MSH3, MSH6, MUTYH, NBN, NF1, NF2, NTHL1, PALB2, PDGFRA, PHOX2B, PMS2, POLD1, POLE, POT1, PRKAR1A, PTCH1, PTEN, RAD50, RAD51C, RAD51D, RB1, RECQL4, RET, RUNX1, SDHAF2, SDHA (sequence changes only), SDHB, SDHC, SDHD, SMAD4, SMARCA4, SMARCB1, SMARCE1, STK11, SUFU, TERC, TERT, TMEM127, TP53, TSC1, TSC2, VHL, WRN and WT1.   Results: No pathogenic mutations were identified. A VUS in the GATA2 gene c.1348G>A (p.Gly450Arg) was identified.  The date of this test report is 05/25/2017.     05/25/2017 Surgery   RIGHT  BREAST LUMPECTOMY WITH RADIOACTIVE  AND  RIGHT AXILLARY SENTINEL LYMPH NODE BIOPSY and Pot placement by Dr. Hoxworth °05/25/17 °  °05/25/2017 Pathology Results  ° Diagnosis 05/25/17 °1. Breast, lumpectomy, right °- INVASIVE DUCTAL CARCINOMA, GRADE II/III, SPANNING 2.2 CM. °- DUCTAL CARCINOMA IN SITU, HIGH GRADE. °- THE SURGICAL RESECTION MARGINS ARE NEGATIVE FOR CARCINOMA. °- SEE ONCOLOGY TABLE BELOW. °2. Breast, excision, right additional superior margin °- BENIGN FIBROADIPOSE TISSUE. °- BENIGN SKELETAL MUSCLE. °- SEE COMMENT. °3. Breast, excision, right additional lateral margin °- BENIGN BREAST PARENCHYMA. °- THERE IS NO EVIDENCE OF MALIGNANCY. °- SEE COMMENT. °4. Breast, excision, chest wall margin °- BENIGN FIBROADIPOSE TISSUE. °- THERE IS NO EVIDENCE OF MALIGNANCY. °- SEE COMMENT. °5. Lymph node, sentinel, biopsy, right axillary °- THERE IS NO EVIDENCE OF CARCINOMA IN 1 OF 1 LYMPH NODE (0/1). °6. Lymph node, sentinel, biopsy, right axillary °- THERE IS NO EVIDENCE OF CARCINOMA IN 1 OF 1 LYMPH NODE (0/1). °7. Lymph node, sentinel, biopsy, right axillary °- THERE IS NO EVIDENCE OF CARCINOMA IN 1 OF 1 LYMPH NODE (0/1). °  °06/26/2017 PET scan  ° PET  °IMPRESSION: °1. Postoperative findings both in the right breast and in the °anatomic pelvis, with associated low-grade activity considered to be °postoperative in nature. No hypermetabolic adenopathy or °hypermetabolic lesions are identified to suggest active metastatic °disease/malignancy. °2. Other imaging findings of potential clinical significance: Aortic °Atherosclerosis (ICD10-I70.0). Sigmoid colon diverticulosis. °Biapical pleuroparenchymal scarring in the lungs. Small amount of °free pelvic fluid in the cul-de-sac, likely postoperative. °  °  °07/01/2017 - 10/14/2017 Chemotherapy  ° Adjuvant TCH with Onpro every 3 weeks for 6 cycles starting on 07/01/17, Changed Taxol to abraxane with cycle 4 due to drug rash reaction, followed by Herceptin every 3 weeks for  6 months ° °  °11/01/2017 - 12/08/2017 Radiation Therapy  ° Radiation therapy to her right breast with Dr. Squire °  °11/04/2017 - 06/2018 Chemotherapy  ° Maintenance Herceptin starting 11/04/17 and will comeplete her 12 months in 06/2018 ° °  °12/23/2017 -  Anti-estrogen oral therapy  ° Adjuvant letrozole 2.5 mg daily started 12/23/17, switched to exemestane on 04/21/18 due to joint pain. Could not afford aromasin, started anastrozole 04/2018.  °  °12/30/2017 Imaging  ° CT CAP W Contrast 12/30/17 °IMPRESSION: °Increased size of 1.1 cm aorto-caval retroperitoneal lymph node. This could be reactive due to interval hysterectomy, however metastatic carcinoma cannot be excluded. Consider continued attention on short-term follow-up CT, or PET-CT scan for further evaluation. °  °Mild hepatic steatosis. °  °01/25/2018 PET scan  ° IMPRESSION: °1. Enlarging hypermetabolic aortocaval lymph node is worrisome for metastatic disease. °2. Probable postoperative seroma in the medial right breast, with associated mild inflammatory hypermetabolism. °  °02/17/2018 -  Antibody Plan  ° Keytruda every 3 weeks starting 02/17/18. Plan for 2 years  °  °04/28/2018 Mammogram  ° Probably benign. °Calcifications in the right breast most likely are dystrophic, related to previous surgery, and are probably benign.  °  °04/28/2018 Imaging  ° DEXA Scan °T Score -2.0 °  °06/10/2018 Echocardiogram  ° 06/10/2018 ECHO °LV EF: 55% -   60% °  °08/28/2018 - 08/2018 Chemotherapy  ° Neratinib 4mg for 1 week then titrate up with 1 additional tablet weekly up to 12 mg starting 08/28/18. Stopped soon after starting due to diarrhea. °  °10/25/2018 Imaging  ° CT CAP 10/25/18  °IMPRESSION: °1. No evidence of metastatic disease. °2.  Aortic atherosclerosis (ICD10-170.0). °  °06/15/2019 Pathology Results  ° Diagnosis °Breast, right, needle core   biopsy, 1 o'clock, 7cmfn °- DENSE FIBROSIS CONSISTENT WITH PRIOR PROCEDURAL CHANGES. °- NO MALIGNANCY IDENTIFIED. °  °Endometrial adenocarcinoma  (HCC)  °05/07/2017 Initial Diagnosis  ° Endometrial adenocarcinoma (HCC) °  °06/09/2017 Surgery  ° XI ROBOTIC ASSISTED TOTAL LAPOROSCOPIC HYSTERECTOMY WITH BILATERAL SALPINGO OOPHORECTOMY and SENTINEL NODE BIOPSY by Dr. Rossi °06/09/17 °  °06/09/2017 Pathology Results  ° Diagnosis 06/09/17 °1. Lymph node, sentinel, biopsy, right external iliac °- METASTATIC ADENOCARCINOMA IN ONE LYMPH NODE (1/1). °2. Lymph node, sentinel, biopsy, left obturator °- ONE BENIGN LYMPH NODE (0/1). °3. Lymph node, sentinel, biopsy, left external iliac °- METASTATIC ADENOCARCINOMA IN ONE LYMPH NODE (1/1). °4. Uterus +/- tubes/ovaries, neoplastic °ENDOMETRIUM: °- ENDOMETRIAL ADENOCARCINOMA, 3.2 CM. °- CARCINOMA INVADES INNER HALF OF MYOMETRIUM. °- LYMPHATIC VASCULAR INVOLVEMENT BY TUMOR. °- CERVIX, BILATERAL FALLOPIAN TUBES AND BILATERAL OVARIES FREE OF TUMOR °  °08/18/2017 - 09/17/2017 Radiation Therapy  ° vaginal brachytherapy per Dr. Squire on starting 08/18/17 °  ° Genetic Testing  ° Patient has genetic testing done for MSI. °Results revealed patient has the following mutation(s): °MSI - High. °  °02/17/2018 -  Chemotherapy  ° Keytruda every 3 weeks starting 02/17/18 °  °04/11/2019 Imaging  ° CT AP W Contrast  °IMPRESSION: °1. No evidence of recurrent or metastatic carcinoma within the °abdomen or pelvis. °2. Colonic diverticulosis. No radiographic evidence of °diverticulitis. °  °  ° ° ° °CURRENT THERAPY:  °-Keytruda every 3 weeks starting 02/17/18.  °-Letrozole 2.5mg daily starting 12/23/17. Switched to Anastrozole in 04/2018 due to joint pain ° °INTERVAL HISTORY:  °Dawn [name] is here for a follow up and treatment. She presents to the clinic alone. She notes she burned herself from ice bag use on sebaceous cyst of right breast. She also used antibiotics on this. This was noticed at [facility] during biopsy of right breast and was drained same day on 06/15/19. She also notes her weight continues to trend up although she exercises almost daily. She notes  having recent hot flashes. She also notes catches in the back occasionally like bad gas.  ° ° °REVIEW OF SYSTEMS:   °Constitutional: Denies fevers, chills or abnormal weight loss (+) hot flashes  °Eyes: Denies blurriness of vision °Ears, nose, mouth, throat, and face: Denies mucositis or sore throat °Respiratory: Denies cough, dyspnea or wheezes °Cardiovascular: Denies palpitation, chest discomfort or lower extremity swelling °Gastrointestinal:  Denies nausea, heartburn or change in bowel habits (+) Gas °Skin: Denies abnormal skin rashes (+) Right breast sebaceous cyst  °Lymphatics: Denies new lymphadenopathy or easy bruising °Neurological:Denies numbness, tingling or new weaknesses °Behavioral/Psych: Mood is stable, no new changes  °All other systems were reviewed with the patient and are negative. ° °MEDICAL HISTORY:  °Past Medical History:  °Diagnosis Date  °• Anemia   ° as a child.  °• Arthritis   °• Bilateral cataracts   °• Bilateral leg cramps   °• Cancer (HCC)   ° skin  °• Dyspnea   °• Family history of bladder cancer   °• Family history of colon cancer in father   °• Fuchs' corneal dystrophy   °• GERD (gastroesophageal reflux disease)   °• Hearing loss   ° Right side 30%  °• History of hiatal hernia   ° small size  °• History of radiation therapy 08/21/17, 08/28/17,09/01/17, 09/11/17, 09/17/17  ° Vaginal cuff brachytherapy.   °• History of radiation therapy 11/11/17- 12/08/17  ° Right Breast treated to 40.05 Gy with 15 fx of 2.67 Gy and a boost of 10 Gy   with 5 fx.   °• Hyperlipidemia   °• Hypertension   °• IBS (irritable bowel syndrome)   ° hx of  °• Malignant neoplasm of upper-inner quadrant of right female breast (HCC) 04/2017  °• NAFL (nonalcoholic fatty liver)   °• Obesity   °• PONV (postoperative nausea and vomiting)   °• Tuberculosis   ° tested positive, mother had when patient was child  °• Uterine cancer (HCC) 04/2017  ° endometrial cancer  °• Varices, gastric   °• Vertigo   ° ° °SURGICAL HISTORY: °Past  Surgical History:  °Procedure Laterality Date  °• BREAST BIOPSY Right 04/27/2017  °• BREAST LUMPECTOMY WITH RADIOACTIVE SEED AND SENTINEL LYMPH NODE BIOPSY Right 05/25/2017  ° Procedure: RIGHT BREAST LUMPECTOMY WITH RADIOACTIVE SEED AND  RIGHT AXILLARY SENTINEL LYMPH NODE BIOPSY;  Surgeon: Hoxworth, Benjamin, MD;  Location: [facility];  Service: General;  Laterality: Right;  °• CATARACT EXTRACTION, BILATERAL    °• COLONOSCOPY    °• HYSTEROSCOPY W/D&C N/A 04/29/2017  ° Procedure: DILATATION AND CURETTAGE /HYSTEROSCOPY;  Surgeon: Morris, Megan, DO;  Location: [facility];  Service: Gynecology;  Laterality: N/A;  °• PORTACATH PLACEMENT Left 05/25/2017  ° Procedure: INSERTION PORT-A-CATH;  Surgeon: Hoxworth, Benjamin, MD;  Location: [facility];  Service: General;  Laterality: Left;  °• ROBOTIC ASSISTED TOTAL HYSTERECTOMY WITH BILATERAL SALPINGO OOPHERECTOMY N/A 06/09/2017  ° Procedure: XI ROBOTIC ASSISTED TOTAL LAPOROSCOPIC HYSTERECTOMY WITH BILATERAL SALPINGO OOPHORECTOMY;  Surgeon: Rossi, Emma, MD;  Location: [facility];  Service: Gynecology;  Laterality: N/A;  °• SENTINEL NODE BIOPSY N/A 06/09/2017  ° Procedure: SENTINEL NODE BIOPSY;  Surgeon: Rossi, Emma, MD;  Location: [facility];  Service: Gynecology;  Laterality: N/A;  ° ° °I have reviewed the social history and family history with the patient and they are unchanged from previous note. ° °ALLERGIES:  is allergic to ciprofloxacin; crestor [rosuvastatin calcium]; fosamax [alendronate sodium]; penicillins; and taxol [paclitaxel]. ° °MEDICATIONS:  °Current Outpatient Medications  °Medication Sig Dispense Refill  °• acetaminophen (TYLENOL) 500 MG tablet Take 500 mg by mouth every 6 (six) hours as needed for moderate pain.    °• anastrozole (ARIMIDEX) 1 MG tablet Take 1 tablet (1 mg total) by mouth daily. 90 tablet 3  °• Ascorbic Acid (VITAMIN C) 1000 MG tablet Take 1,000 mg by mouth daily.    °• b complex vitamins capsule Take 1 capsule by mouth daily.      °• Calcium Carbonate-Vitamin D3 (CALCIUM 600-D) 600-400 MG-UNIT TABS Take 1-2 tablets by mouth daily.    °• diphenhydrAMINE (BENADRYL) 25 MG tablet Take 25-50 mg by mouth every 6 (six) hours as needed for sleep.    °• ibandronate (BONIVA) 150 MG tablet Take 1 tablet (150 mg total) by mouth every 30 (thirty) days. 3 tablet 3  °• ibuprofen (ADVIL,MOTRIN) 200 MG tablet Take 200 mg by mouth every 6 (six) hours as needed.    °• levothyroxine (SYNTHROID) 50 MCG tablet TAKE 1 TABLET BY MOUTH  DAILY 90 tablet 3  °• lidocaine-prilocaine (EMLA) cream APPLY TO AFFECTED AREA ONCE 30 g 2  °• Multiple Vitamin (MULTIVITAMIN) capsule Take 1 capsule by mouth daily.      °• olmesartan-hydrochlorothiazide (BENICAR HCT) 20-12.5 MG tablet Take 1 tablet by mouth daily. 90 tablet 2  °• Pitavastatin Calcium (LIVALO) 2 MG TABS Take 1.5 tablets (3 mg total) by mouth every evening. Takes 1.5 tablet 135 tablet 3  °• potassium chloride SA (KLOR-CON) 20 MEQ tablet Take 1 tablet (20 mEq total)   by mouth daily. 30 tablet 2  °• prednisoLONE acetate (PRED FORTE) 1 % ophthalmic suspension Place 1 drop into the left eye 3 (three) times daily.    °• sodium chloride (MURO 128) 5 % ophthalmic solution Place 2 drops into both eyes daily.    °• tiZANidine (ZANAFLEX) 2 MG tablet Take 1 tablet (2 mg total) by mouth at bedtime as needed for muscle spasms. 30 tablet 1  °• triamcinolone cream (KENALOG) 0.1 % APPLY TO ITCHY SPOTS ONCE DAILY  1  ° °No current facility-administered medications for this visit.   ° °Facility-Administered Medications Ordered in Other Visits  °Medication Dose Route Frequency Provider Last Rate Last Dose  °• sodium chloride flush (NS) 0.9 % injection 10 mL  10 mL Intracatheter PRN Feng, Yan, MD   10 mL at 08/25/18 1111  ° ° °PHYSICAL EXAMINATION: °ECOG PERFORMANCE STATUS: 0 - Asymptomatic ° °Vitals:  ° 07/27/19 0928  °BP: 130/79  °Pulse: 79  °Resp: 18  °Temp: 98 °F (36.7 °C)  °SpO2: 98%  ° °Filed Weights  ° 07/27/19 0928  °Weight: 195  lb 8 oz (88.7 kg)  ° ° °GENERAL:alert, no distress and comfortable °SKIN: skin color, texture, turgor are normal, no rashes or significant lesions °EYES: normal, Conjunctiva are pink and non-injected, sclera clear  °NECK: supple, thyroid normal size, non-tender, without nodularity °LYMPH:  no palpable lymphadenopathy in the cervical, axillary  °LUNGS: clear to auscultation and percussion with normal breathing effort °HEART: regular rate & rhythm and no murmurs and no lower extremity edema °ABDOMEN:abdomen soft, non-tender and normal bowel sounds °Musculoskeletal:no cyanosis of digits and no clubbing  °NEURO: alert & oriented x 3 with fluent speech, no focal motor/sensory deficits °-BREAST: S/p right lumpectomy: surgical incision healed well (+) 2 spots of skin erythema of right breast with 1 shallow ulcer. (+) palpable 1x1.5cm oil cyst at 1:00 position, 6 cm from nipple. No palpable mass, nodules or adenopathy bilaterally. Breast exam benign.  ° °LABORATORY DATA:  °I have reviewed the data as listed °CBC Latest Ref Rng & Units 07/27/2019 07/06/2019 06/15/2019  °WBC 4.0 - 10.5 K/uL 4.5 4.2 4.0  °Hemoglobin 12.0 - 15.0 g/dL 13.7 13.7 13.8  °Hematocrit 36.0 - 46.0 % 38.7 39.5 39.0  °Platelets 150 - 400 K/uL 150 150 162  ° ° ° °CMP Latest Ref Rng & Units 07/27/2019 07/06/2019 06/15/2019  °Glucose 70 - 99 mg/dL 144(H) 138(H) 169(H)  °BUN 8 - 23 mg/dL 11 12 10  °Creatinine 0.44 - 1.00 mg/dL 0.78 0.79 0.80  °Sodium 135 - 145 mmol/L 137 137 137  °Potassium 3.5 - 5.1 mmol/L 3.7 3.9 3.3(L)  °Chloride 98 - 111 mmol/L 101 100 98  °CO2 22 - 32 mmol/L 27 24 27  °Calcium 8.9 - 10.3 mg/dL 9.1 9.2 9.0  °Total Protein 6.5 - 8.1 g/dL 7.1 7.0 7.1  °Total Bilirubin 0.3 - 1.2 mg/dL 0.6 0.8 0.8  °Alkaline Phos 38 - 126 U/L 48 37(L) 43  °AST 15 - 41 U/L 65(H) 67(H) 71(H)  °ALT 0 - 44 U/L 101(H) 101(H) 111(H)  ° ° ° ° °RADIOGRAPHIC STUDIES: °I have personally reviewed the radiological images as listed and agreed with the findings in the  report. °No results found.  ° °ASSESSMENT & PLAN:  °Dawn [name] is a 68 y.o. female with  ° °1. 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 Treated with right breast lumpectomy, adjuvant chemo and radiation. She   tried Nerlynx but could not tolerate due to diarrhea, and does not want try again. °-She started anti-estrogen therapy with letrozole. Due to joint pain I switched her to Anastrozole in 04/2018. Tolerating better.  °-She notes joint pain, hot flashes and her weight continues to trend up from anastrozole, tolerable currently.  °-Her prior exam showed right breast mass. Her 06/15/19 right breast biopsy showed dense benign breast tissue. This was indicated to be a oil cyst and was drained.  °-She is clinically doing well. Lab reviewed, her CBC and CMP are within normal limits except mild transaminitis. Her physical exam was unremarkable, she still has oil cyst palpable in right breast . There is no clinical concern for recurrence. °-Continue Surveillance. Next mammogram in 10/2019 °-Continue anastrozole  °-F/u in 7 weeks  °  °2. Endometrial adenocarcinoma with lymph node metastasis, pT1apN1M0, FIGO stage IIIc, MSI-H, probable RP node recurrence in 12/2017  °-Diagnosed in 04/2017. Treated with hysterectomy, BSO, adjuvant radiation, and chemo with Carbotaxol. Currently on Keytruda every 3 weeks for presumed retroperitoneal lymph node metastasis since 02/17/18. Tolerating well. She will continue to complete 2 years treatment. If no evidence of progression, plan to complete in 02/2020.  °-Given recent occasional pressure in her lower abdomen she plans to return to her Gyn for pap smear. Last was over 1 year ago.  °-Her last her CT AP from 04/11/19 shows no evidence of recurrent or metastatic carcinoma. Repeat scan in early Feb 2021. °-Lab reviewed and adequate to proceed with Keytruda today. She continues to tolerate well.   °  °3. HTN °-Well Controlled, Continue to f/u with PCP  °  °4. Transaminitis  °-Secondary to fatty liver disease °-Stable  °  °5. Osteopenia  °-DEXA scan in 05/2018 revealed T score of -2.0. °-She is on monthly boniva, prescribed by her PCP, since 02/2018. Continue  °-Continue Vitamin D and Calcium supplements. Will check her Vitamin D level every 6 months.   °  °6. Metabolic syndrome, hyperglycemia  °-She has no history of diabetes. Her random blood glucose was 213, we discussed diabetic diet and exercise °-She was recently found to have abnormal TSH levels. She has been seen by endocrinologist and has started levothyroxine on 01/04/19. Will monitor TSH level every 3 weeks.  °-Her weight does continue to trend up. I encouraged her to remain active.  °-Continue to follow-up with her primary care physician  °  °7. Hypokalemia °-On KCL 20meq daily  °  °  °Plan  °-Labs reviewed and adequate to proceed with Keytruda today and continue every 3 weeks  °-Continue Anastrozole °-Lab, flush, Keytruda in 3 and 7 weeks (postpone for one week due to Holiday) °-F/u in 7 weeks   ° ° °No problem-specific Assessment & Plan notes found for this encounter. ° ° °No orders of the defined types were placed in this encounter. ° °All questions were answered. The patient knows to call the clinic with any problems, questions or concerns. No barriers to learning was detected. °I spent 20 minutes counseling the patient face to face. The total time spent in the appointment was 25 minutes and more than 50% was on counseling and review of test results ° °  ° Yan Feng, MD °07/27/2019  ° °I, Amoya Bennett, am acting as scribe for Yan Feng, MD.  ° °I have reviewed the above documentation for accuracy and completeness, and I agree with the above. °  ° ° ° ° °

## 2019-07-27 ENCOUNTER — Inpatient Hospital Stay: Payer: Medicare Other

## 2019-07-27 ENCOUNTER — Inpatient Hospital Stay (HOSPITAL_BASED_OUTPATIENT_CLINIC_OR_DEPARTMENT_OTHER): Payer: Medicare Other | Admitting: Hematology

## 2019-07-27 ENCOUNTER — Encounter: Payer: Self-pay | Admitting: Hematology

## 2019-07-27 ENCOUNTER — Inpatient Hospital Stay: Payer: Medicare Other | Attending: Hematology

## 2019-07-27 ENCOUNTER — Other Ambulatory Visit: Payer: Self-pay

## 2019-07-27 VITALS — BP 130/79 | HR 79 | Temp 98.0°F | Resp 18 | Ht 63.0 in | Wt 195.5 lb

## 2019-07-27 DIAGNOSIS — E876 Hypokalemia: Secondary | ICD-10-CM | POA: Insufficient documentation

## 2019-07-27 DIAGNOSIS — C541 Malignant neoplasm of endometrium: Secondary | ICD-10-CM

## 2019-07-27 DIAGNOSIS — E785 Hyperlipidemia, unspecified: Secondary | ICD-10-CM | POA: Diagnosis not present

## 2019-07-27 DIAGNOSIS — Z95828 Presence of other vascular implants and grafts: Secondary | ICD-10-CM

## 2019-07-27 DIAGNOSIS — R739 Hyperglycemia, unspecified: Secondary | ICD-10-CM | POA: Insufficient documentation

## 2019-07-27 DIAGNOSIS — C50211 Malignant neoplasm of upper-inner quadrant of right female breast: Secondary | ICD-10-CM | POA: Diagnosis not present

## 2019-07-27 DIAGNOSIS — Z800 Family history of malignant neoplasm of digestive organs: Secondary | ICD-10-CM | POA: Diagnosis not present

## 2019-07-27 DIAGNOSIS — M810 Age-related osteoporosis without current pathological fracture: Secondary | ICD-10-CM | POA: Diagnosis not present

## 2019-07-27 DIAGNOSIS — Z5112 Encounter for antineoplastic immunotherapy: Secondary | ICD-10-CM | POA: Insufficient documentation

## 2019-07-27 DIAGNOSIS — Z923 Personal history of irradiation: Secondary | ICD-10-CM | POA: Insufficient documentation

## 2019-07-27 DIAGNOSIS — I10 Essential (primary) hypertension: Secondary | ICD-10-CM | POA: Diagnosis not present

## 2019-07-27 DIAGNOSIS — Z79899 Other long term (current) drug therapy: Secondary | ICD-10-CM | POA: Diagnosis not present

## 2019-07-27 DIAGNOSIS — E669 Obesity, unspecified: Secondary | ICD-10-CM | POA: Insufficient documentation

## 2019-07-27 DIAGNOSIS — M2550 Pain in unspecified joint: Secondary | ICD-10-CM | POA: Insufficient documentation

## 2019-07-27 DIAGNOSIS — R946 Abnormal results of thyroid function studies: Secondary | ICD-10-CM | POA: Diagnosis not present

## 2019-07-27 DIAGNOSIS — R232 Flushing: Secondary | ICD-10-CM | POA: Insufficient documentation

## 2019-07-27 DIAGNOSIS — N951 Menopausal and female climacteric states: Secondary | ICD-10-CM | POA: Insufficient documentation

## 2019-07-27 DIAGNOSIS — M8580 Other specified disorders of bone density and structure, unspecified site: Secondary | ICD-10-CM | POA: Insufficient documentation

## 2019-07-27 DIAGNOSIS — Z79811 Long term (current) use of aromatase inhibitors: Secondary | ICD-10-CM | POA: Diagnosis not present

## 2019-07-27 DIAGNOSIS — K760 Fatty (change of) liver, not elsewhere classified: Secondary | ICD-10-CM | POA: Diagnosis not present

## 2019-07-27 DIAGNOSIS — Z791 Long term (current) use of non-steroidal anti-inflammatories (NSAID): Secondary | ICD-10-CM | POA: Insufficient documentation

## 2019-07-27 DIAGNOSIS — K219 Gastro-esophageal reflux disease without esophagitis: Secondary | ICD-10-CM | POA: Insufficient documentation

## 2019-07-27 DIAGNOSIS — Z9221 Personal history of antineoplastic chemotherapy: Secondary | ICD-10-CM | POA: Diagnosis not present

## 2019-07-27 DIAGNOSIS — Z170 Estrogen receptor positive status [ER+]: Secondary | ICD-10-CM | POA: Insufficient documentation

## 2019-07-27 DIAGNOSIS — E8881 Metabolic syndrome: Secondary | ICD-10-CM | POA: Diagnosis not present

## 2019-07-27 LAB — CMP (CANCER CENTER ONLY)
ALT: 101 U/L — ABNORMAL HIGH (ref 0–44)
AST: 65 U/L — ABNORMAL HIGH (ref 15–41)
Albumin: 3.9 g/dL (ref 3.5–5.0)
Alkaline Phosphatase: 48 U/L (ref 38–126)
Anion gap: 9 (ref 5–15)
BUN: 11 mg/dL (ref 8–23)
CO2: 27 mmol/L (ref 22–32)
Calcium: 9.1 mg/dL (ref 8.9–10.3)
Chloride: 101 mmol/L (ref 98–111)
Creatinine: 0.78 mg/dL (ref 0.44–1.00)
GFR, Est AFR Am: >60 mL/min (ref >60)
GFR, Estimated: >60 mL/min (ref >60)
Glucose, Bld: 144 mg/dL — ABNORMAL HIGH (ref 70–99)
Potassium: 3.7 mmol/L (ref 3.5–5.1)
Sodium: 137 mmol/L (ref 135–145)
Total Bilirubin: 0.6 mg/dL (ref 0.3–1.2)
Total Protein: 7.1 g/dL (ref 6.5–8.1)

## 2019-07-27 LAB — CBC WITH DIFFERENTIAL (CANCER CENTER ONLY)
Abs Immature Granulocytes: 0.01 10*3/uL (ref 0.00–0.07)
Basophils Absolute: 0 10*3/uL (ref 0.0–0.1)
Basophils Relative: 1 %
Eosinophils Absolute: 0.2 10*3/uL (ref 0.0–0.5)
Eosinophils Relative: 3 %
HCT: 38.7 % (ref 36.0–46.0)
Hemoglobin: 13.7 g/dL (ref 12.0–15.0)
Immature Granulocytes: 0 %
Lymphocytes Relative: 43 %
Lymphs Abs: 2 10*3/uL (ref 0.7–4.0)
MCH: 35.3 pg — ABNORMAL HIGH (ref 26.0–34.0)
MCHC: 35.4 g/dL (ref 30.0–36.0)
MCV: 99.7 fL (ref 80.0–100.0)
Monocytes Absolute: 0.4 10*3/uL (ref 0.1–1.0)
Monocytes Relative: 9 %
Neutro Abs: 2 10*3/uL (ref 1.7–7.7)
Neutrophils Relative %: 44 %
Platelet Count: 150 10*3/uL (ref 150–400)
RBC: 3.88 MIL/uL (ref 3.87–5.11)
RDW: 11.9 % (ref 11.5–15.5)
WBC Count: 4.5 10*3/uL (ref 4.0–10.5)
nRBC: 0 % (ref 0.0–0.2)

## 2019-07-27 MED ORDER — SODIUM CHLORIDE 0.9% FLUSH
10.0000 mL | INTRAVENOUS | Status: DC | PRN
  Administered 2019-07-27: 10 mL
  Filled 2019-07-27: qty 10

## 2019-07-27 MED ORDER — SODIUM CHLORIDE 0.9% FLUSH
10.0000 mL | INTRAVENOUS | Status: DC | PRN
  Administered 2019-07-27: 10 mL via INTRAVENOUS
  Filled 2019-07-27: qty 10

## 2019-07-27 MED ORDER — HEPARIN SOD (PORK) LOCK FLUSH 100 UNIT/ML IV SOLN
500.0000 [IU] | Freq: Once | INTRAVENOUS | Status: AC | PRN
  Administered 2019-07-27: 500 [IU]
  Filled 2019-07-27: qty 5

## 2019-07-27 MED ORDER — SODIUM CHLORIDE 0.9 % IV SOLN
Freq: Once | INTRAVENOUS | Status: AC
  Administered 2019-07-27: 10:00:00 via INTRAVENOUS
  Filled 2019-07-27: qty 250

## 2019-07-27 MED ORDER — SODIUM CHLORIDE 0.9 % IV SOLN
200.0000 mg | Freq: Once | INTRAVENOUS | Status: AC
  Administered 2019-07-27: 200 mg via INTRAVENOUS
  Filled 2019-07-27: qty 8

## 2019-07-27 NOTE — Patient Instructions (Signed)
[facility] Discharge Instructions for Patients Receiving Chemotherapy  Today you received the following chemotherapy agents:  Keytruda.  To help prevent nausea and vomiting after your treatment, we encourage you to take your nausea medication as directed.   If you develop nausea and vomiting that is not controlled by your nausea medication, call the clinic.   BELOW ARE SYMPTOMS THAT SHOULD BE REPORTED IMMEDIATELY:  *FEVER GREATER THAN 100.5 F  *CHILLS WITH OR WITHOUT FEVER  NAUSEA AND VOMITING THAT IS NOT CONTROLLED WITH YOUR NAUSEA MEDICATION  *UNUSUAL SHORTNESS OF BREATH  *UNUSUAL BRUISING OR BLEEDING  TENDERNESS IN MOUTH AND THROAT WITH OR WITHOUT PRESENCE OF ULCERS  *URINARY PROBLEMS  *BOWEL PROBLEMS  UNUSUAL RASH Items with * indicate a potential emergency and should be followed up as soon as possible.  Feel free to call the clinic should you have any questions or concerns. The clinic phone number is [phone].  Please show the CHEMO ALERT CARD at check-in to the Emergency Department and triage nurse.    

## 2019-07-27 NOTE — Patient Instructions (Signed)

## 2019-07-27 NOTE — Progress Notes (Signed)
Per [name] okay to treat with ALT 101, Keytruda today.

## 2019-07-28 ENCOUNTER — Telehealth: Payer: Self-pay | Admitting: Hematology

## 2019-07-28 ENCOUNTER — Encounter: Payer: Self-pay | Admitting: Family Medicine

## 2019-07-28 NOTE — Telephone Encounter (Signed)
Scheduled appt per 11/11 los. ° °Spoke with pt and she is aware of her appt date and time. °

## 2019-08-01 ENCOUNTER — Telehealth: Payer: Self-pay | Admitting: Hematology

## 2019-08-01 NOTE — Telephone Encounter (Signed)
Returned patient's phone call regarding rescheduling an appointment, left a voicemail. 

## 2019-08-02 ENCOUNTER — Telehealth: Payer: Self-pay | Admitting: Hematology

## 2019-08-02 NOTE — Telephone Encounter (Signed)
Returned patient's phone call regarding rescheduling an appointment, per patient's request 12/02 appointments have moved to 12/04 due to patient being out of town.  Message to provider.

## 2019-08-17 ENCOUNTER — Ambulatory Visit: Payer: Medicare Other

## 2019-08-17 ENCOUNTER — Other Ambulatory Visit: Payer: Medicare Other

## 2019-08-19 ENCOUNTER — Inpatient Hospital Stay: Payer: Medicare Other

## 2019-08-19 ENCOUNTER — Other Ambulatory Visit: Payer: Self-pay

## 2019-08-19 ENCOUNTER — Inpatient Hospital Stay: Payer: Medicare Other | Attending: Hematology

## 2019-08-19 VITALS — BP 151/86 | HR 78 | Temp 98.5°F | Resp 16

## 2019-08-19 DIAGNOSIS — I10 Essential (primary) hypertension: Secondary | ICD-10-CM | POA: Diagnosis not present

## 2019-08-19 DIAGNOSIS — R634 Abnormal weight loss: Secondary | ICD-10-CM | POA: Diagnosis not present

## 2019-08-19 DIAGNOSIS — E7800 Pure hypercholesterolemia, unspecified: Secondary | ICD-10-CM | POA: Diagnosis not present

## 2019-08-19 DIAGNOSIS — E876 Hypokalemia: Secondary | ICD-10-CM | POA: Insufficient documentation

## 2019-08-19 DIAGNOSIS — Z79811 Long term (current) use of aromatase inhibitors: Secondary | ICD-10-CM | POA: Insufficient documentation

## 2019-08-19 DIAGNOSIS — Z923 Personal history of irradiation: Secondary | ICD-10-CM | POA: Diagnosis not present

## 2019-08-19 DIAGNOSIS — K760 Fatty (change of) liver, not elsewhere classified: Secondary | ICD-10-CM | POA: Diagnosis not present

## 2019-08-19 DIAGNOSIS — Z791 Long term (current) use of non-steroidal anti-inflammatories (NSAID): Secondary | ICD-10-CM | POA: Insufficient documentation

## 2019-08-19 DIAGNOSIS — M8580 Other specified disorders of bone density and structure, unspecified site: Secondary | ICD-10-CM | POA: Insufficient documentation

## 2019-08-19 DIAGNOSIS — Z800 Family history of malignant neoplasm of digestive organs: Secondary | ICD-10-CM | POA: Insufficient documentation

## 2019-08-19 DIAGNOSIS — E785 Hyperlipidemia, unspecified: Secondary | ICD-10-CM | POA: Diagnosis not present

## 2019-08-19 DIAGNOSIS — Z79899 Other long term (current) drug therapy: Secondary | ICD-10-CM | POA: Insufficient documentation

## 2019-08-19 DIAGNOSIS — K219 Gastro-esophageal reflux disease without esophagitis: Secondary | ICD-10-CM | POA: Diagnosis not present

## 2019-08-19 DIAGNOSIS — Z9221 Personal history of antineoplastic chemotherapy: Secondary | ICD-10-CM | POA: Insufficient documentation

## 2019-08-19 DIAGNOSIS — Z5112 Encounter for antineoplastic immunotherapy: Secondary | ICD-10-CM | POA: Diagnosis not present

## 2019-08-19 DIAGNOSIS — C541 Malignant neoplasm of endometrium: Secondary | ICD-10-CM

## 2019-08-19 DIAGNOSIS — Z95828 Presence of other vascular implants and grafts: Secondary | ICD-10-CM

## 2019-08-19 DIAGNOSIS — M1990 Unspecified osteoarthritis, unspecified site: Secondary | ICD-10-CM | POA: Diagnosis not present

## 2019-08-19 DIAGNOSIS — Z853 Personal history of malignant neoplasm of breast: Secondary | ICD-10-CM | POA: Diagnosis not present

## 2019-08-19 DIAGNOSIS — Z8542 Personal history of malignant neoplasm of other parts of uterus: Secondary | ICD-10-CM | POA: Diagnosis not present

## 2019-08-19 DIAGNOSIS — R232 Flushing: Secondary | ICD-10-CM | POA: Insufficient documentation

## 2019-08-19 DIAGNOSIS — E8881 Metabolic syndrome: Secondary | ICD-10-CM | POA: Diagnosis not present

## 2019-08-19 DIAGNOSIS — C50211 Malignant neoplasm of upper-inner quadrant of right female breast: Secondary | ICD-10-CM | POA: Diagnosis present

## 2019-08-19 DIAGNOSIS — Z170 Estrogen receptor positive status [ER+]: Secondary | ICD-10-CM | POA: Insufficient documentation

## 2019-08-19 DIAGNOSIS — Z8052 Family history of malignant neoplasm of bladder: Secondary | ICD-10-CM | POA: Insufficient documentation

## 2019-08-19 DIAGNOSIS — N951 Menopausal and female climacteric states: Secondary | ICD-10-CM | POA: Insufficient documentation

## 2019-08-19 DIAGNOSIS — R946 Abnormal results of thyroid function studies: Secondary | ICD-10-CM | POA: Insufficient documentation

## 2019-08-19 LAB — COMPREHENSIVE METABOLIC PANEL
ALT: 102 U/L — ABNORMAL HIGH (ref 0–44)
AST: 64 U/L — ABNORMAL HIGH (ref 15–41)
Albumin: 3.9 g/dL (ref 3.5–5.0)
Alkaline Phosphatase: 49 U/L (ref 38–126)
Anion gap: 10 (ref 5–15)
BUN: 12 mg/dL (ref 8–23)
CO2: 27 mmol/L (ref 22–32)
Calcium: 9.1 mg/dL (ref 8.9–10.3)
Chloride: 101 mmol/L (ref 98–111)
Creatinine, Ser: 0.84 mg/dL (ref 0.44–1.00)
GFR calc Af Amer: >60 mL/min (ref >60)
GFR calc non Af Amer: >60 mL/min (ref >60)
Glucose, Bld: 155 mg/dL — ABNORMAL HIGH (ref 70–99)
Potassium: 3.8 mmol/L (ref 3.5–5.1)
Sodium: 138 mmol/L (ref 135–145)
Total Bilirubin: 0.7 mg/dL (ref 0.3–1.2)
Total Protein: 7.2 g/dL (ref 6.5–8.1)

## 2019-08-19 LAB — CBC WITH DIFFERENTIAL/PLATELET
Abs Immature Granulocytes: 0.01 10*3/uL (ref 0.00–0.07)
Basophils Absolute: 0 10*3/uL (ref 0.0–0.1)
Basophils Relative: 1 %
Eosinophils Absolute: 0.1 10*3/uL (ref 0.0–0.5)
Eosinophils Relative: 3 %
HCT: 40.4 % (ref 36.0–46.0)
Hemoglobin: 14.2 g/dL (ref 12.0–15.0)
Immature Granulocytes: 0 %
Lymphocytes Relative: 43 %
Lymphs Abs: 1.9 10*3/uL (ref 0.7–4.0)
MCH: 35.9 pg — ABNORMAL HIGH (ref 26.0–34.0)
MCHC: 35.1 g/dL (ref 30.0–36.0)
MCV: 102 fL — ABNORMAL HIGH (ref 80.0–100.0)
Monocytes Absolute: 0.4 10*3/uL (ref 0.1–1.0)
Monocytes Relative: 10 %
Neutro Abs: 2 10*3/uL (ref 1.7–7.7)
Neutrophils Relative %: 43 %
Platelets: 151 10*3/uL (ref 150–400)
RBC: 3.96 MIL/uL (ref 3.87–5.11)
RDW: 11.9 % (ref 11.5–15.5)
WBC: 4.5 10*3/uL (ref 4.0–10.5)
nRBC: 0 % (ref 0.0–0.2)

## 2019-08-19 MED ORDER — SODIUM CHLORIDE 0.9% FLUSH
10.0000 mL | Freq: Once | INTRAVENOUS | Status: AC
  Administered 2019-08-19: 10 mL
  Filled 2019-08-19: qty 10

## 2019-08-19 MED ORDER — SODIUM CHLORIDE 0.9% FLUSH
10.0000 mL | INTRAVENOUS | Status: DC | PRN
  Administered 2019-08-19: 10 mL
  Filled 2019-08-19: qty 10

## 2019-08-19 MED ORDER — SODIUM CHLORIDE 0.9 % IV SOLN
200.0000 mg | Freq: Once | INTRAVENOUS | Status: AC
  Administered 2019-08-19: 200 mg via INTRAVENOUS
  Filled 2019-08-19: qty 8

## 2019-08-19 MED ORDER — SODIUM CHLORIDE 0.9 % IV SOLN
Freq: Once | INTRAVENOUS | Status: AC
  Administered 2019-08-19: 09:00:00 via INTRAVENOUS
  Filled 2019-08-19: qty 250

## 2019-08-19 MED ORDER — HEPARIN SOD (PORK) LOCK FLUSH 100 UNIT/ML IV SOLN
500.0000 [IU] | Freq: Once | INTRAVENOUS | Status: AC | PRN
  Administered 2019-08-19: 500 [IU]
  Filled 2019-08-19: qty 5

## 2019-08-19 NOTE — Patient Instructions (Signed)

## 2019-08-19 NOTE — Progress Notes (Signed)
Per [name] okay to treat with ALT102

## 2019-08-22 LAB — TSH: TSH: 2.205 u[IU]/mL (ref 0.308–3.960)

## 2019-08-30 ENCOUNTER — Telehealth: Payer: Self-pay

## 2019-08-30 NOTE — Telephone Encounter (Signed)
Ms Poeppelman left vm asking if it is ok to get covid vaccine when available.  Per [name] pt's on active treatment should not get vaccine at this time. I called pt but she did not answer.  I did not leave a vm.

## 2019-08-31 ENCOUNTER — Telehealth: Payer: Self-pay

## 2019-08-31 NOTE — Telephone Encounter (Signed)
Spoke with patient to let her know per [name] that she is not recommending patient's who are undergoing active treatment to get Covid vaccine.  She verbalized an understanding.

## 2019-09-11 IMAGING — CT NM PET TUM IMG INITIAL (PI) SKULL BASE T - THIGH
7 series · 23 of 25 positions shown · non-contrast
Comparison: Multiple exams, including CT examinations from
05/07/2017 and 04/03/2017

CLINICAL DATA: Initial Treatment strategy for right breast cancer
and concurrent endometrial cancer..

EXAM:
NUCLEAR MEDICINE PET SKULL BASE TO THIGH
TECHNIQUE: 9.1 mCi F-18 FDG was injected intravenously. Full-ring PET imaging
was performed from the skull base to thigh after the radiotracer. CT
data was obtained and used for attenuation correction and anatomic
localization.
FASTING BLOOD GLUCOSE:  Value: 105 mg/dl

[Series 4: ct sk_thigh 5.0 b31f · axial · 5.0mm · 0.98mm/px · z∈[-925,-21]mm · 6 of 227 slices shown]
[im 1/227]
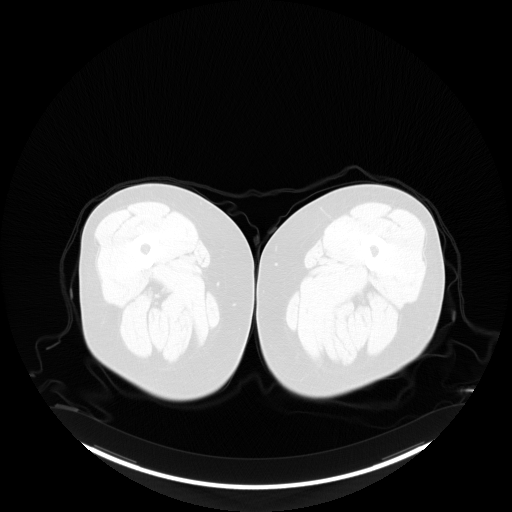
[im 46/227]
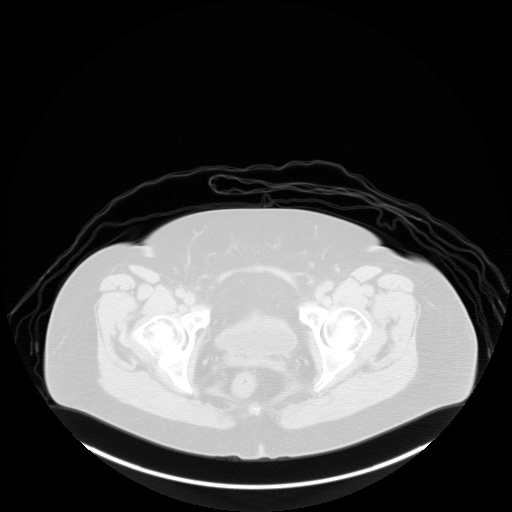
[im 91/227]
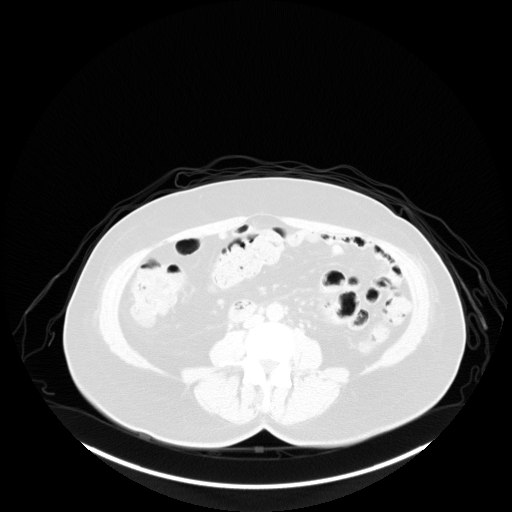
[im 136/227]
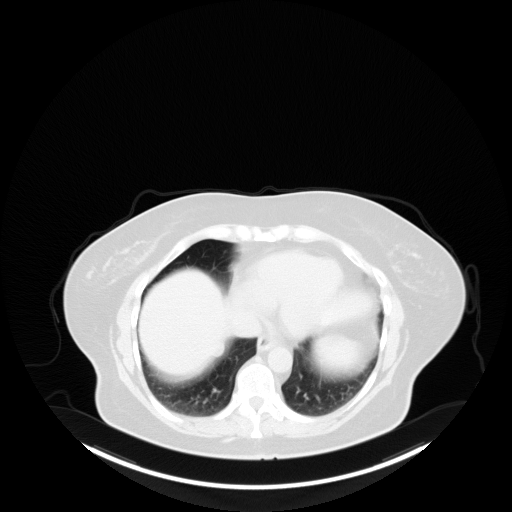
[im 181/227  brain]
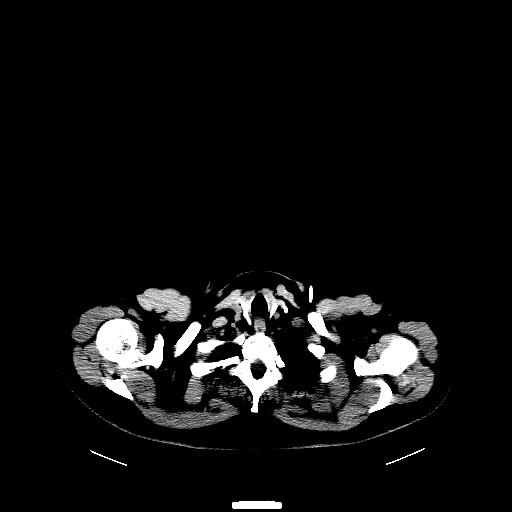
[im 227/227  brain]
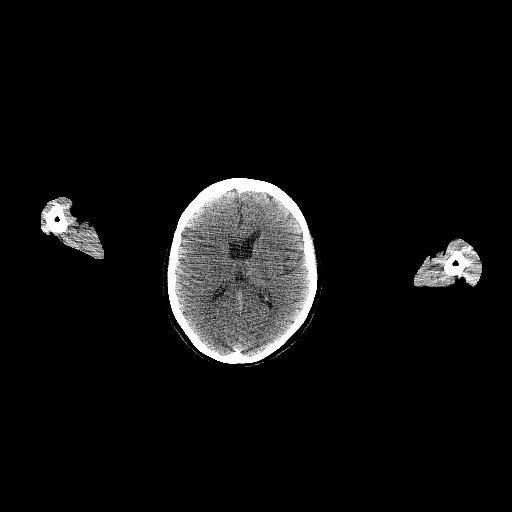

[Series 5: pet sk_thigh nac · axial · 5.0mm · 4.07mm/px · z∈[-777,-21]mm · 6 of 227 slices shown]
[im 38/227]
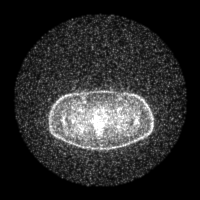
[im 76/227]
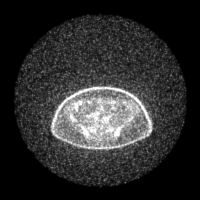
[im 114/227]
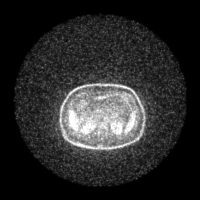
[im 151/227]
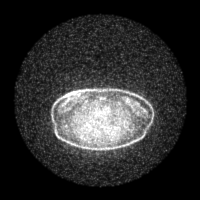
[im 189/227]
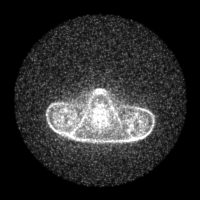
[im 227/227]
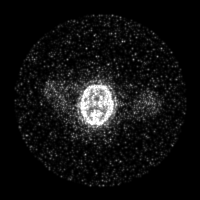

[Series 8: ct sk_thigh 5.0 b70f (id)_bone · axial · 5.0mm · 0.62mm/px · z∈[-455,-183]mm · 2 of 69 slices shown]
[im 1/69  bone]
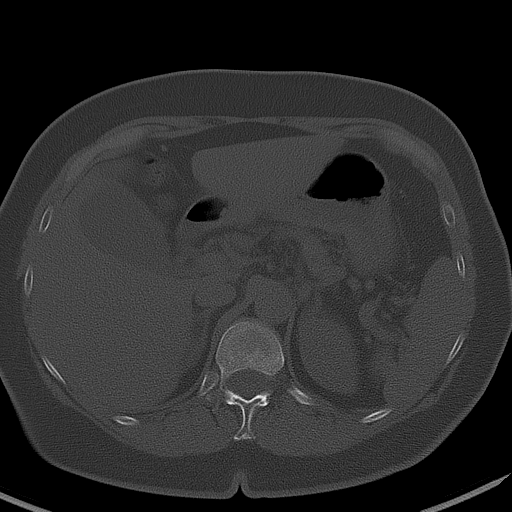
[im 69/69  bone]
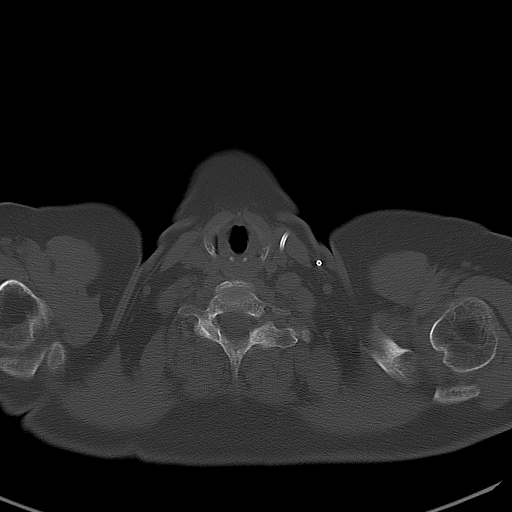

[Series 603: range-ct sk_thigh 5.0 (id)<alpha range> · 2 of 82 slices shown (1 of 2)]
[im 1/82]
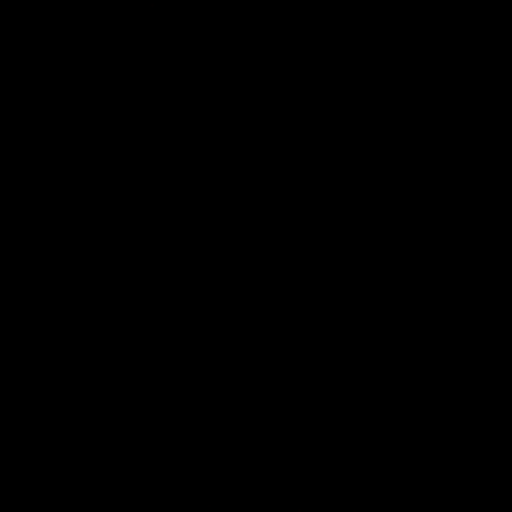
[im 82/82]
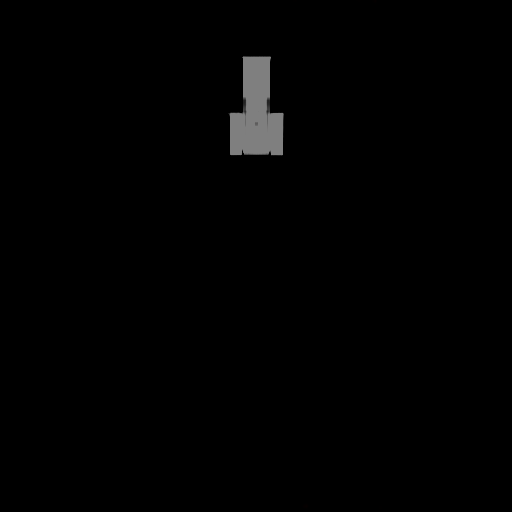

[Series 604: mip range · coronal · 1.88mm/px · 1 of 32 slices shown]
[im 1/32]
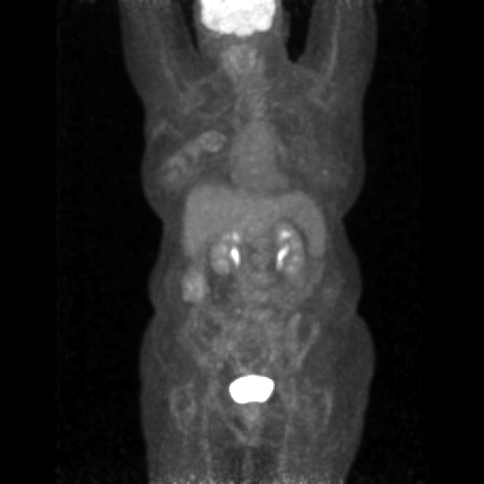

[Series 605: range-ct sk_thigh 5.0 (id)<alpha range> · 5 of 222 slices shown (2 of 2)]
[im 45/222]
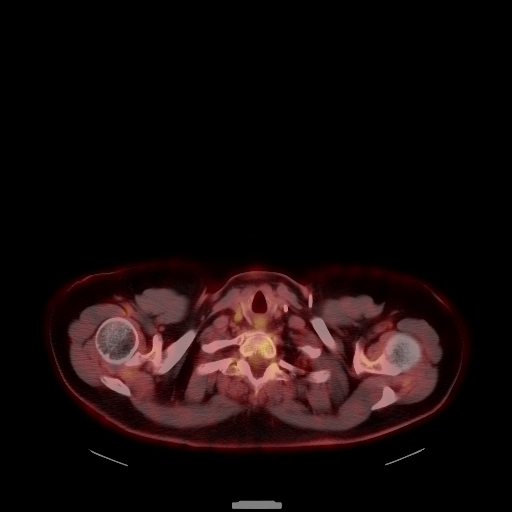
[im 89/222]
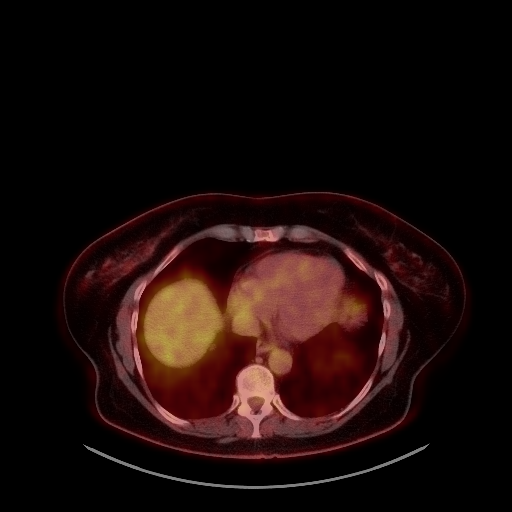
[im 133/222]
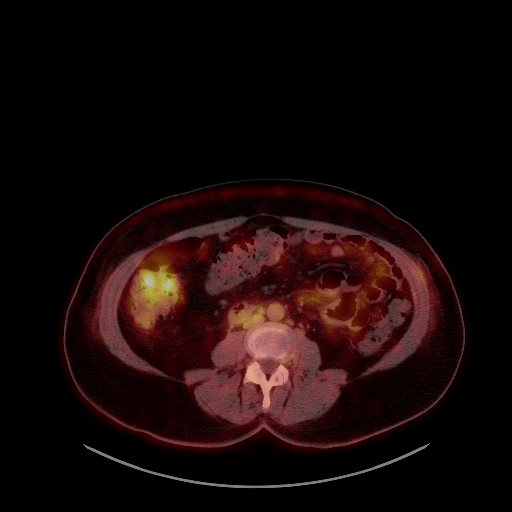
[im 177/222]
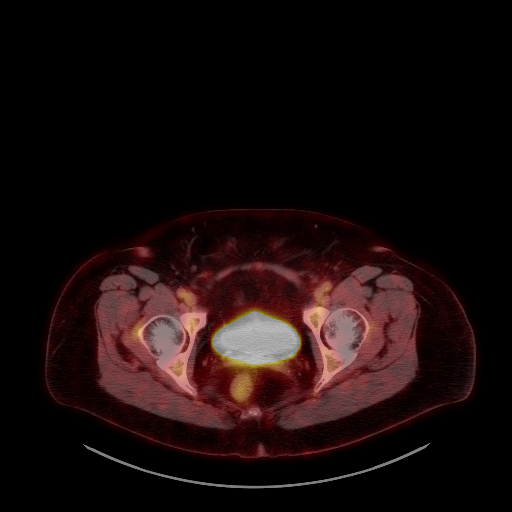
[im 222/222]
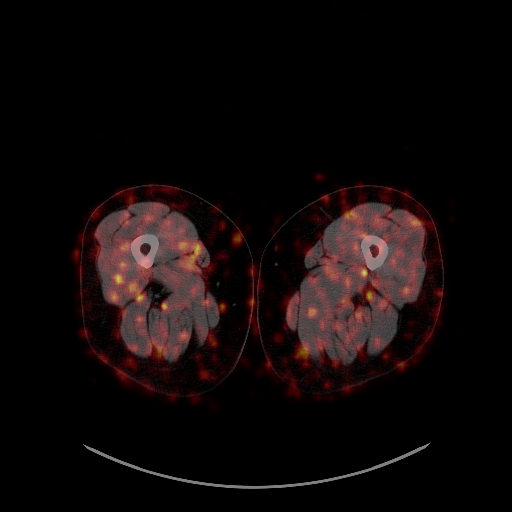

[Series 1023: results mm oncology reading · 5.0mm · 0.66mm/px · 1 of 1 slices shown]
[im 1/1]
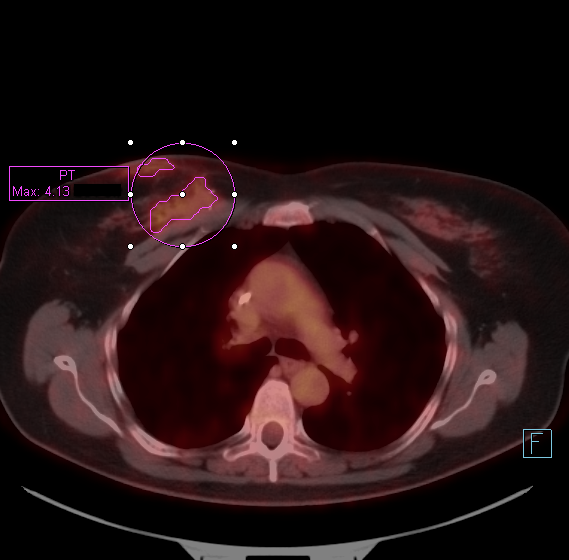

[23 of 25 positions shown; findings below may reference images not displayed]

FINDINGS: NECK

No hypermetabolic lymph nodes in the neck.

CHEST

Accentuated activity at the postoperative site in the left upper
medial breast, maximum SUV in this region 4.1, likely from
postoperative inflammation. Similarly there is some low-level
activity associated with the right axillary dissection. No focal
abnormal residual activity to favor residual malignancy. No
accentuated metabolic nodal activity or significant abnormal
pulmonary nodule. Small right pleural lipoma at the lung base.

Left internal jugular Port-A-Cath tip: Lower SVC. Mild biapical
pleuroparenchymal scarring.

ABDOMEN/PELVIS

There is low-level activity along the remaining left broad ligament
and along the right vaginal cuff. This low-level activity is
compatible with patient's recent hysterectomy and bilateral
salpingo-oophorectomy 2.5 weeks ago, and currently does not raise
suspicion for tumor. There is a small amount of free pelvic fluid in
the cul-de-sac region.

No findings of hypermetabolic adenopathy or involvement of the
liver, spleen, pancreas, or adrenal glands.

Sigmoid colon diverticulosis. Aortoiliac atherosclerotic vascular
disease.

SKELETON

No focal hypermetabolic activity to suggest skeletal metastasis.
IMPRESSION: 1. Postoperative findings both in the right breast and in the
anatomic pelvis, with associated low-grade activity considered to be
postoperative in nature. No hypermetabolic adenopathy or
hypermetabolic lesions are identified to suggest active metastatic
disease/malignancy.
2. Other imaging findings of potential clinical significance: Aortic
Atherosclerosis ([id number]). Sigmoid colon diverticulosis.
Biapical pleuroparenchymal scarring in the lungs. Small amount of
free pelvic fluid in the cul-de-sac, likely postoperative.

## 2019-09-12 NOTE — Progress Notes (Addendum)
[place]   Telephone:([phone] Fax:(336) [phone]   Clinic Follow up Note   Patient Care Team: [name], MD as PCP - General Excell Seltzer, MD (Inactive) as Consulting Physician (General Surgery) Truitt Merle, MD as Consulting Physician (Hematology) [name], MD as Attending Physician (Radiation Oncology)  Date of Service:  09/14/2019   I connected with Dawn [name] on 09/14/2019 by video enabled telemedicine visit and verified that I am speaking with the correct person using two identifiers.   I discussed the limitations, risks, security and privacy concerns of performing an evaluation and management service by telephone and the availability of in person appointments. I also discussed with the patient that there may be a patient responsible charge related to this service. The patient expressed understanding and agreed to proceed.   Other persons participating in the visit and their role in the encounter:  my scribe Amoya Bernnett    Patient's location: office  Provider's location:  Home  CHIEF COMPLAINT: F/u on breast and endometrial cancers  SUMMARY OF ONCOLOGIC HISTORY: Oncology History Overview Note  MSI high Cancer Staging Endometrial cancer ([place]) Staging form: Corpus Uteri - Adenosarcoma, AJCC 8th Edition - Pathologic stage from 06/09/2017: Stage IIIC (pT1a, pN1, cM0) - Signed by Truitt Merle, MD on 06/16/2017  Malignant neoplasm of upper-inner quadrant of right breast in female, estrogen receptor positive ([place]) Staging form: Breast, AJCC 8th Edition - Clinical stage from 05/06/2017: Stage IA (cT1c, cN0, cM0, G2, ER: Positive, PR: Negative, HER2: Positive) - Unsigned - Pathologic stage from 05/25/2017: Stage IIA (pT2, pN0, cM0, G2, ER: Positive, PR: Negative, HER2: Negative) - Signed by Truitt Merle, MD on 06/16/2017     Malignant neoplasm of upper-inner quadrant of right breast in female, estrogen receptor positive ([place])  04/27/2017  Initial Biopsy   Diagnosis Breast, right, needle core biopsy INVASIVE DUCTAL CARCINOMA, GRADE 2 Microscopic Comment The neoplasm has intracellular mucin and signet ring cell features. Immunostains shows these cells are positive for ER,GATA3, ck7 and GCDFP, negative for ck20, cdx2, TTF-1 and pax8, The immunostaining pattern supports the neoplasm is breast primary.   04/27/2017 Receptors her2   Estrogen Receptor: 70%, POSITIVE, MODERATE STAINING INTENSITY Progesterone Receptor: 0%, NEGATIVE Proliferation Marker Ki67: 30% HER2 - **POSITIVE** RATIO OF HER2/CEP17 SIGNALS 2.91 AVERAGE HER2 COPY NUMBER PER CELL 7.28   04/27/2017 Initial Diagnosis   Malignant neoplasm of upper-inner quadrant of right breast in female, estrogen receptor positive ([place])   05/07/2017 Imaging   CT Chest W Contrast 05/07/17 IMPRESSION: Tiny well-defined fatty lesion on the pleura at the right lung base. The thinner slice collimation used for today's chest CT eliminates volume-averaging seen in the lesion on the prior exam and confirms that this is a diffusely fatty nodule. This is a benign finding and likely represents a tiny lipoma. No defect in the hemidiaphragm evident to suggest tiny diaphragmatic hernia. Pulmonary hamartoma a consideration although the lack of soft tissue components makes this less likely.   05/15/2017 Genetic Testing   Patient had genetic testing due to a personal history of breast cancer and uterine cancer as well as a family history of cancer. The Multi-Cancer panel was ordered. The Multi-Cancer Panel offered by [facility] includes sequencing and/or deletion duplication testing of the following 83 genes: ALK, APC, ATM, AXIN2,BAP1,  BARD1, BLM, BMPR1A, BRCA1, BRCA2, BRIP1, CASR, CDC73, CDH1, CDK4, CDKN1B, CDKN1C, CDKN2A (p14ARF), CDKN2A (p16INK4a), CEBPA, CHEK2, CTNNA1, DICER1, DIS3L2, EGFR (c.2369C>T, p.Thr790Met variant only), EPCAM (Deletion/duplication testing only), FH, FLCN, GATA2,  GPC3, GREM1  (Promoter region deletion/duplication testing only), HOXB13 (c.251G>A, p.Gly84Glu), HRAS, KIT, MAX, MEN1, MET, MITF (c.952G>A, p.Glu318Lys variant only), MLH1, MSH2, MSH3, MSH6, MUTYH, NBN, NF1, NF2, NTHL1, PALB2, PDGFRA, PHOX2B, PMS2, POLD1, POLE, POT1, PRKAR1A, PTCH1, PTEN, RAD50, RAD51C, RAD51D, RB1, RECQL4, RET, RUNX1, SDHAF2, SDHA (sequence changes only), SDHB, SDHC, SDHD, SMAD4, SMARCA4, SMARCB1, SMARCE1, STK11, SUFU, TERC, TERT, TMEM127, TP53, TSC1, TSC2, VHL, WRN and WT1.   Results: No pathogenic mutations were identified. A VUS in the GATA2 gene c.1348G>A (p.Gly450Arg) was identified.  The date of this test report is 05/25/2017.     05/25/2017 Surgery   RIGHT BREAST LUMPECTOMY WITH RADIOACTIVE SEED AND  RIGHT AXILLARY SENTINEL LYMPH NODE BIOPSY and Pot placement by Dr. Excell Seltzer 05/25/17   05/25/2017 Pathology Results   Diagnosis 05/25/17 1. Breast, lumpectomy, right - INVASIVE DUCTAL CARCINOMA, GRADE II/III, SPANNING 2.2 CM. - DUCTAL CARCINOMA IN SITU, HIGH GRADE. - THE SURGICAL RESECTION MARGINS ARE NEGATIVE FOR CARCINOMA. - SEE ONCOLOGY TABLE BELOW. 2. Breast, excision, right additional superior margin - BENIGN FIBROADIPOSE TISSUE. - BENIGN SKELETAL MUSCLE. - SEE COMMENT. 3. Breast, excision, right additional lateral margin - BENIGN BREAST PARENCHYMA. - THERE IS NO EVIDENCE OF MALIGNANCY. - SEE COMMENT. 4. Breast, excision, chest wall margin - BENIGN FIBROADIPOSE TISSUE. - THERE IS NO EVIDENCE OF MALIGNANCY. - SEE COMMENT. 5. Lymph node, sentinel, biopsy, right axillary - THERE IS NO EVIDENCE OF CARCINOMA IN 1 OF 1 LYMPH NODE (0/1). 6. Lymph node, sentinel, biopsy, right axillary - THERE IS NO EVIDENCE OF CARCINOMA IN 1 OF 1 LYMPH NODE (0/1). 7. Lymph node, sentinel, biopsy, right axillary - THERE IS NO EVIDENCE OF CARCINOMA IN 1 OF 1 LYMPH NODE (0/1).   06/26/2017 PET scan   PET  IMPRESSION: 1. Postoperative findings both in the right breast and in the anatomic pelvis,  with associated low-grade activity considered to be postoperative in nature. No hypermetabolic adenopathy or hypermetabolic lesions are identified to suggest active metastatic disease/malignancy. 2. Other imaging findings of potential clinical significance: Aortic Atherosclerosis (ICD10-I70.0). Sigmoid colon diverticulosis. Biapical pleuroparenchymal scarring in the lungs. Small amount of free pelvic fluid in the cul-de-sac, likely postoperative.    07/01/2017 - 10/14/2017 Chemotherapy   Adjuvant TCH with Onpro every 3 weeks for 6 cycles starting on 07/01/17, Changed Taxol to abraxane with cycle 4 due to drug rash reaction, followed by Herceptin every 3 weeks for 6 months    11/01/2017 - 12/08/2017 Radiation Therapy   Radiation therapy to her right breast with [name]   11/04/2017 - 06/2018 Chemotherapy   Maintenance Herceptin starting 11/04/17 and will comeplete her 12 months in 06/2018    12/23/2017 -  Anti-estrogen oral therapy   Adjuvant letrozole 2.5 mg daily started 12/23/17, switched to exemestane on 04/21/18 due to joint pain. Could not afford aromasin, started anastrozole 04/2018.    12/30/2017 Imaging   CT CAP W Contrast 12/30/17 IMPRESSION: Increased size of 1.1 cm aorto-caval retroperitoneal lymph node. This could be reactive due to interval hysterectomy, however metastatic carcinoma cannot be excluded. Consider continued attention on short-term follow-up CT, or PET-CT scan for further evaluation.  Mild hepatic steatosis.   01/25/2018 PET scan   IMPRESSION: 1. Enlarging hypermetabolic aortocaval lymph node is worrisome for metastatic disease. 2. Probable postoperative seroma in the medial right breast, with associated mild inflammatory hypermetabolism.   02/17/2018 -  Antibody Plan   Keytruda every 3 weeks starting 02/17/18. Plan for 2 years    04/28/2018 Mammogram   Probably benign.  Calcifications in the right breast most likely are dystrophic, related to previous surgery,  and are probably benign.    04/28/2018 Imaging   DEXA Scan T Score -2.0   06/10/2018 Echocardiogram   06/10/2018 ECHO LV EF: 55% -   60%   08/28/2018 - 08/2018 Chemotherapy   Neratinib 34m for 1 week then titrate up with 1 additional tablet weekly up to 12 mg starting 08/28/18. Stopped soon after starting due to diarrhea.   10/25/2018 Imaging   CT CAP 10/25/18  IMPRESSION: 1. No evidence of metastatic disease. 2.  Aortic atherosclerosis (ICD10-170.0).   06/15/2019 Pathology Results   Diagnosis Breast, right, needle core biopsy, 1 o'clock, 7cmfn - DENSE FIBROSIS CONSISTENT WITH PRIOR PROCEDURAL CHANGES. - NO MALIGNANCY IDENTIFIED.   Endometrial adenocarcinoma ([place]  05/07/2017 Initial Diagnosis   [place]ometrial adenocarcinoma ([place]   06/09/2017 Surgery   XI ROBOTIC ASSISTED TOTAL LAPOROSCOPIC HYSTERECTOMY WITH BILATERAL SALPINGO OOPHORECTOMY and SENTINEL NODE BIOPSY by Dr. RDenman George9/25/18   06/09/2017 Pathology Results   Diagnosis 06/09/17 1. Lymph node, sentinel, biopsy, right external iliac - METASTATIC ADENOCARCINOMA IN ONE LYMPH NODE (1/1). 2. Lymph node, sentinel, biopsy, left obturator - ONE BENIGN LYMPH NODE (0/1). 3. Lymph node, sentinel, biopsy, left external iliac - METASTATIC ADENOCARCINOMA IN ONE LYMPH NODE (1/1). 4. Uterus +/- tubes/ovaries, neoplastic ENDOMETRIUM: - ENDOMETRIAL ADENOCARCINOMA, 3.2 CM. - CARCINOMA INVADES INNER HALF OF MYOMETRIUM. 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 - CERVIX, BILATERAL FALLOPIAN TUBES AND BILATERAL OVARIES FREE OF TUMOR   08/18/2017 - 09/17/2017 Radiation Therapy   vaginal brachytherapy per Dr. 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   02/17/2018 -  Chemotherapy   Keytruda every 3 weeks starting 02/17/18   04/11/2019 Imaging   CT AP W Contrast  IMPRESSION: 1. No evidence of recurrent or metastatic carcinoma  within the abdomen or pelvis. 2. Colonic diverticulosis. No radiographic evidence of diverticulitis.        CURRENT THERAPY:  -Keytruda every 3 weeksstarting 02/17/18. -Letrozole 2.526mdaily starting 12/23/17. Switched to Anastrozole in 04/2018 due to joint pain  INTERVAL HISTORY:  ViGREGORY DOWEs here for a follow up and treatment. She presents to the clinic alone. She notes she is doing well. She notes she has gas building in her upper back. She has not taken antiacid for this yet. She is on levothyroxine which impacts the time she would take antiacid. She notes her office will allow her to stay in office and not clinically in [place]. She wonders can she get COVID vaccine. She is also interested in losing weight is open to [facility] resources to help.    REVIEW OF SYSTEMS:   Constitutional: Denies fevers, chills or abnormal weight loss Eyes: Denies blurriness of vision Ears, nose, mouth, throat, and face: Denies mucositis or sore throat Respiratory: Denies cough, dyspnea or wheezes Cardiovascular: Denies palpitation, chest discomfort or lower extremity swelling Gastrointestinal:  Denies nausea (+) Acid reflux  Skin: Denies abnormal skin rashes Lymphatics: Denies new lymphadenopathy or easy bruising Neurological:Denies numbness, tingling or new weaknesses Behavioral/Psych: Mood is stable, no new changes  All other systems were reviewed with the patient and are negative.  MEDICAL HISTORY:  Past Medical History:  Diagnosis Date  . Anemia    as a child.  . Arthritis   . Bilateral cataracts   . Bilateral leg cramps   . Cancer ([place]  skin  . Dyspnea   . Family history of bladder cancer   . Family history of colon cancer in father   . Fuchs' corneal dystrophy   . GERD (gastroesophageal reflux disease)   . Hearing loss    Right side 30%  . History of hiatal hernia    small size  . History of radiation therapy 08/21/17, 08/28/17,09/01/17, 09/11/17, 09/17/17   Vaginal  cuff brachytherapy.   Marland Kitchen History of radiation therapy 11/11/17- 12/08/17   Right Breast treated to 40.05 Gy with 15 fx of 2.67 Gy and a boost of 10 Gy with 5 fx.   . Hyperlipidemia   . Hypertension   . IBS (irritable bowel syndrome)    hx of  . Malignant neoplasm of upper-inner quadrant of right female breast ([place]) 04/2017  . NAFL (nonalcoholic fatty liver)   . Obesity   . PONV (postoperative nausea and vomiting)   . 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 Uterine cancer ([place]) 04/2017   endometrial cancer  . Varices, gastric   . Vertigo     SURGICAL HISTORY: Past Surgical History:  Procedure Laterality Date  . BREAST BIOPSY Right 04/27/2017  . BREAST LUMPECTOMY WITH RADIOACTIVE SEED AND SENTINEL LYMPH NODE BIOPSY Right 05/25/2017   Procedure: RIGHT BREAST LUMPECTOMY WITH RADIOACTIVE SEED AND  RIGHT AXILLARY SENTINEL LYMPH NODE BIOPSY;  Surgeon: Excell Seltzer, MD;  Location: [facility];  Service: General;  Laterality: Right;  . CATARACT EXTRACTION, BILATERAL    . COLONOSCOPY    . HYSTEROSCOPY WITH D & C N/A 04/29/2017   Procedure: DILATATION AND CURETTAGE /HYSTEROSCOPY;  Surgeon: Linda Hedges, DO;  Location: [facility];  Service: Gynecology;  Laterality: N/A;  . PORTACATH PLACEMENT Left 05/25/2017   Procedure: INSERTION PORT-A-CATH;  Surgeon: Excell Seltzer, MD;  Location: [facility];  Service: General;  Laterality: Left;  . ROBOTIC ASSISTED TOTAL HYSTERECTOMY WITH BILATERAL SALPINGO OOPHERECTOMY N/A 06/09/2017   Procedure: XI ROBOTIC ASSISTED TOTAL LAPOROSCOPIC HYSTERECTOMY WITH BILATERAL SALPINGO OOPHORECTOMY;  Surgeon: Everitt Amber, MD;  Location: [facility];  Service: Gynecology;  Laterality: N/A;  . SENTINEL NODE BIOPSY N/A 06/09/2017   Procedure: SENTINEL NODE BIOPSY;  Surgeon: Everitt Amber, MD;  Location: [facility];  Service: Gynecology;  Laterality: N/A;    I have reviewed the social history and family history with the  patient and they are unchanged from previous note.  ALLERGIES:  is allergic to ciprofloxacin; crestor [rosuvastatin calcium]; fosamax [alendronate sodium]; penicillins; and taxol [paclitaxel].  MEDICATIONS:  Current Outpatient Medications  Medication Sig Dispense Refill  . acetaminophen (TYLENOL) 500 MG tablet Take 500 mg by mouth every 6 (six) hours as needed for moderate pain.    Marland Kitchen anastrozole (ARIMIDEX) 1 MG tablet Take 1 tablet (1 mg total) by mouth daily. 90 tablet 3  . Ascorbic Acid (VITAMIN C) 1000 MG tablet Take 1,000 mg by mouth daily.    Marland Kitchen b complex vitamins capsule Take 1 capsule by mouth daily.    . Calcium Carbonate-Vitamin D3 (CALCIUM 600-D) 600-400 MG-UNIT TABS Take 1-2 tablets by mouth daily.    . diphenhydrAMINE (BENADRYL) 25 MG tablet Take 25-50 mg by mouth every 6 (six) hours as needed for sleep.    Marland Kitchen ibandronate (BONIVA) 150 MG tablet Take 1 tablet (150 mg total) by mouth every 30 (thirty) days. 3 tablet 3  . ibuprofen (ADVIL,MOTRIN) 200 MG tablet Take 200 mg by mouth every 6 (six) hours as needed.    Marland Kitchen  levothyroxine (SYNTHROID) 50 MCG tablet TAKE 1 TABLET BY MOUTH  DAILY 90 tablet 3  . lidocaine-prilocaine (EMLA) cream APPLY TO AFFECTED AREA ONCE 30 g 2  . Multiple Vitamin (MULTIVITAMIN) capsule Take 1 capsule by mouth daily.      Marland Kitchen olmesartan-hydrochlorothiazide (BENICAR HCT) 20-12.5 MG tablet Take 1 tablet by mouth daily. 90 tablet 2  . Pitavastatin Calcium (LIVALO) 2 MG TABS Take 1.5 tablets (3 mg total) by mouth every evening. Takes 1.5 tablet 135 tablet 3  . potassium chloride SA (KLOR-CON) 20 MEQ tablet Take 1 tablet (20 mEq total) by mouth daily. 30 tablet 2  . prednisoLONE acetate (PRED FORTE) 1 % ophthalmic suspension Place 1 drop into the left eye 3 (three) times daily.    . sodium chloride (MURO 128) 5 % ophthalmic solution Place 2 drops into both eyes daily.    Marland Kitchen tiZANidine (ZANAFLEX) 2 MG tablet Take 1 tablet (2 mg total) by mouth at bedtime as needed for  muscle spasms. 30 tablet 1  . triamcinolone cream (KENALOG) 0.1 % APPLY TO ITCHY SPOTS ONCE DAILY  1   No current facility-administered medications for this visit.   Facility-Administered Medications Ordered in Other Visits  Medication Dose Route Frequency Provider Last Rate Last Admin  . sodium chloride flush (NS) 0.9 % injection 10 mL  10 mL Intracatheter PRN Truitt Merle, MD   10 mL at 08/25/18 1111  . sodium chloride flush (NS) 0.9 % injection 10 mL  10 mL Intracatheter PRN Truitt Merle, MD   10 mL at 09/14/19 1247    PHYSICAL EXAMINATION: ECOG PERFORMANCE STATUS: 0 - Asymptomatic  Vitals:   09/14/19 1057  BP: 136/80  Pulse: 76  Resp: 18  Temp: 98.1 F (36.7 C)  SpO2: 97%   Filed Weights   09/14/19 1057  Weight: 195 lb 11.2 oz (88.8 kg)     Exam not performed today per virtual visit   LABORATORY DATA:  I have reviewed the data as listed CBC Latest Ref Rng & Units 09/14/2019 08/19/2019 07/27/2019  WBC 4.0 - 10.5 K/uL 4.3 4.5 4.5  Hemoglobin 12.0 - 15.0 g/dL 13.6 14.2 13.7  Hematocrit 36.0 - 46.0 % 39.2 40.4 38.7  Platelets 150 - 400 K/uL 143(L) 151 150     CMP Latest Ref Rng & Units 09/14/2019 08/19/2019 07/27/2019  Glucose 70 - 99 mg/dL 140(H) 155(H) 144(H)  BUN 8 - 23 mg/dL _0 Creatinine 0.44 - 1.00 mg/dL 0.77 0.84 0.78  Sodium 135 - 145 mmol/L 139 138 137  Potassium 3.5 - 5.1 mmol/L 3.6 3.8 3.7  Chloride 98 - 111 mmol/L 101 101 101  CO2 22 - 32 mmol/L _1 Calcium 8.9 - 10.3 mg/dL 8.9 9.1 9.1  Total Protein 6.5 - 8.1 g/dL 7.1 7.2 7.1  Total Bilirubin 0.3 - 1.2 mg/dL 0.7 0.7 0.6  Alkaline Phos 38 - 126 U/L 43 49 48  AST 15 - 41 U/L 56(H) 64(H) 65(H)  ALT 0 - 44 U/L 91(H) 102(H) 101(H)      RADIOGRAPHIC STUDIES: I have personally reviewed the radiological images as listed and agreed with the findings in the report. No results found.   ASSESSMENT & PLAN:  Dawn [name] is a 68 y.o. female with   1. 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 Treated with right breast lumpectomy, adjuvant chemo and radiation.She tried Nerlynx but could not tolerate  due to diarrhea,and does not want try again. -She started anti-estrogen therapy with letrozole. Due to joint pain I switched her to Anastrozole in 04/2018. -She notes joint pain, hot flashes and her weight continues to trend up from anastrozole, but overall tolerable currently.  -She notes her oil cyst is still healing and palpable of right breast. I discussed this may take a long time to heal. Will continue to monitor on future exams and scans.  -Continue Surveillance. Next mammogram in 10/2019 -Continue anastrozole -F/u in6weeks  2. Endometrial adenocarcinomawith lymph node metastasis, pT1apN1M0, FIGO stage IIIc, MSI-H, probable RP node recurrence in 12/2017 -Diagnosed in 04/2017. Treated with hysterectomy, BSO, adjuvant radiation, and chemo with Carbotaxol. Currently on Keytruda every 3 weeksfor presumed retroperitoneal lymph node metastasis since 02/17/18. Tolerating well. She will continue to complete 2 years treatment.If noevidence of progression, plan to complete in 02/2020. -Her lasther CT AP from 04/11/19 showsno evidence of recurrent or metastatic carcinoma. Repeat scan in late 09/2019. -Lab reviewed. She does have slightly enlarged RBCs, will monitor. Overall and adequate to proceed with Keytruda today. She continues to tolerate well. -For continued colon cancer screening is fine to proceed with endoscopy. I recommend she see [facility]. She is agreeable.  -Although she is on immunotherapy and there is no data on COVID19 vaccine with this however she should be fine to proceed with vaccination. I will write letter clearing her to Berneda Rose.   3. HTN, Acid reflux, High Cholesterol  -Well Controlled, Continue tof/u with PCP -She has had acid reflux causing upper back pain  lately. I encouraged her to take antiacid which is fine to take at night given her levothyroxine.   4. Transaminitis -Secondary to fatty liver disease -Stable   5.Osteopenia -DEXA scan in 05/2018 revealed T score of -2.0. -She is on monthly boniva, prescribed by her PCP, since 02/2018.Continue -ContinueVitamin D and Calcium supplements. Will check her Vitamin D level every 6 months.   6.Metabolic syndrome, hyperglycemia   -She has no history of diabetes. Her random blood glucose was 213, we discussed diabetic diet and exercise -She was recently found to have abnormal TSH levels. She has been seen by endocrinologist and has startedlevothyroxineon 01/04/19. Will monitor TSH level every 3 weeks.  -Her weight does continue to trend up. I encouraged her to remainactive. -Continue to follow-up with her primary care physician  7. Hypokalemia -On KCL 4mq daily  8. Weight Gain -She is interested in weight loss -I offered [facility]. She is interested, I will refer her.  -I also discussed clinical trial for weight loss. I will check her eligibility, she is interested.    Plan -Will write email to JBerneda Roseto clear her for COVID19 Vaccine  -Labs reviewed and [facility]  -Continue Anastrozole -Lab, flush, Keytruda in 3, 6 weeks  -F/u in 6 weeks with restaging CT CAP a few days before    No problem-specific Assessment & Plan notes found for this encounter.   Orders Placed This Encounter  Procedures  . CT Abdomen Pelvis W Contrast    Standing Status:   Future    Standing Expiration Date:   09/13/2020    Order Specific Question:   If indicated for the ordered procedure, I authorize the administration of contrast media per Radiology protocol    Answer:   Yes    Order Specific Question:   Preferred imaging location?  Answer:   [facility]    Order Specific  Question:   Release to patient    Answer:   Immediate    Order Specific Question:   Is Oral Contrast requested for this exam?    Answer:   Yes, Per Radiology protocol    Order Specific Question:   Radiology Contrast Protocol - do NOT remove file path    Answer:   [email or web address]  . CT Chest W Contrast    Standing Status:   Future    Standing Expiration Date:   09/13/2020    Order Specific Question:   If indicated for the ordered procedure, I authorize the administration of contrast media per Radiology protocol    Answer:   Yes    Order Specific Question:   Preferred imaging location?    Answer:   [facility]    Order Specific Question:   Radiology Contrast Protocol - do NOT remove file path    Answer:   [email or web address]   All questions were answered. The patient knows to call the clinic with any problems, questions or concerns. No barriers to learning was detected. I spent 20 minutes counseling the patient face to face. The total time spent in the appointment was 25 minutes and more than 50% was on counseling and review of test results     Truitt Merle, MD 09/14/2019   I, Joslyn Devon, am acting as scribe for Truitt Merle, MD.   I have reviewed the above documentation for accuracy and completeness, and I agree with the above.

## 2019-09-14 ENCOUNTER — Other Ambulatory Visit: Payer: Self-pay

## 2019-09-14 ENCOUNTER — Encounter: Payer: Self-pay | Admitting: Hematology

## 2019-09-14 ENCOUNTER — Inpatient Hospital Stay: Payer: Medicare Other

## 2019-09-14 ENCOUNTER — Inpatient Hospital Stay (HOSPITAL_BASED_OUTPATIENT_CLINIC_OR_DEPARTMENT_OTHER): Payer: Medicare Other | Admitting: Hematology

## 2019-09-14 VITALS — BP 136/80 | HR 76 | Temp 98.1°F | Resp 18 | Ht 63.0 in | Wt 195.7 lb

## 2019-09-14 DIAGNOSIS — R635 Abnormal weight gain: Secondary | ICD-10-CM

## 2019-09-14 DIAGNOSIS — E785 Hyperlipidemia, unspecified: Secondary | ICD-10-CM

## 2019-09-14 DIAGNOSIS — E876 Hypokalemia: Secondary | ICD-10-CM

## 2019-09-14 DIAGNOSIS — Z79811 Long term (current) use of aromatase inhibitors: Secondary | ICD-10-CM

## 2019-09-14 DIAGNOSIS — C778 Secondary and unspecified malignant neoplasm of lymph nodes of multiple regions: Secondary | ICD-10-CM

## 2019-09-14 DIAGNOSIS — R7401 Elevation of levels of liver transaminase levels: Secondary | ICD-10-CM

## 2019-09-14 DIAGNOSIS — C541 Malignant neoplasm of endometrium: Secondary | ICD-10-CM

## 2019-09-14 DIAGNOSIS — C50211 Malignant neoplasm of upper-inner quadrant of right female breast: Secondary | ICD-10-CM | POA: Diagnosis not present

## 2019-09-14 DIAGNOSIS — M810 Age-related osteoporosis without current pathological fracture: Secondary | ICD-10-CM

## 2019-09-14 DIAGNOSIS — Z170 Estrogen receptor positive status [ER+]: Secondary | ICD-10-CM

## 2019-09-14 DIAGNOSIS — Z95828 Presence of other vascular implants and grafts: Secondary | ICD-10-CM

## 2019-09-14 DIAGNOSIS — Z79899 Other long term (current) drug therapy: Secondary | ICD-10-CM

## 2019-09-14 DIAGNOSIS — E039 Hypothyroidism, unspecified: Secondary | ICD-10-CM

## 2019-09-14 DIAGNOSIS — K219 Gastro-esophageal reflux disease without esophagitis: Secondary | ICD-10-CM

## 2019-09-14 DIAGNOSIS — I10 Essential (primary) hypertension: Secondary | ICD-10-CM

## 2019-09-14 DIAGNOSIS — Z923 Personal history of irradiation: Secondary | ICD-10-CM

## 2019-09-14 DIAGNOSIS — Z9221 Personal history of antineoplastic chemotherapy: Secondary | ICD-10-CM

## 2019-09-14 DIAGNOSIS — Z5112 Encounter for antineoplastic immunotherapy: Secondary | ICD-10-CM | POA: Diagnosis not present

## 2019-09-14 LAB — CBC WITH DIFFERENTIAL/PLATELET
Abs Immature Granulocytes: 0 10*3/uL (ref 0.00–0.07)
Basophils Absolute: 0 10*3/uL (ref 0.0–0.1)
Basophils Relative: 1 %
Eosinophils Absolute: 0.2 10*3/uL (ref 0.0–0.5)
Eosinophils Relative: 4 %
HCT: 39.2 % (ref 36.0–46.0)
Hemoglobin: 13.6 g/dL (ref 12.0–15.0)
Immature Granulocytes: 0 %
Lymphocytes Relative: 44 %
Lymphs Abs: 1.9 10*3/uL (ref 0.7–4.0)
MCH: 35.2 pg — ABNORMAL HIGH (ref 26.0–34.0)
MCHC: 34.7 g/dL (ref 30.0–36.0)
MCV: 101.6 fL — ABNORMAL HIGH (ref 80.0–100.0)
Monocytes Absolute: 0.4 10*3/uL (ref 0.1–1.0)
Monocytes Relative: 9 %
Neutro Abs: 1.8 10*3/uL (ref 1.7–7.7)
Neutrophils Relative %: 42 %
Platelets: 143 10*3/uL — ABNORMAL LOW (ref 150–400)
RBC: 3.86 MIL/uL — ABNORMAL LOW (ref 3.87–5.11)
RDW: 12 % (ref 11.5–15.5)
WBC: 4.3 10*3/uL (ref 4.0–10.5)
nRBC: 0 % (ref 0.0–0.2)

## 2019-09-14 LAB — COMPREHENSIVE METABOLIC PANEL
ALT: 91 U/L — ABNORMAL HIGH (ref 0–44)
AST: 56 U/L — ABNORMAL HIGH (ref 15–41)
Albumin: 3.9 g/dL (ref 3.5–5.0)
Alkaline Phosphatase: 43 U/L (ref 38–126)
Anion gap: 12 (ref 5–15)
BUN: 11 mg/dL (ref 8–23)
CO2: 26 mmol/L (ref 22–32)
Calcium: 8.9 mg/dL (ref 8.9–10.3)
Chloride: 101 mmol/L (ref 98–111)
Creatinine, Ser: 0.77 mg/dL (ref 0.44–1.00)
GFR calc Af Amer: >60 mL/min (ref >60)
GFR calc non Af Amer: >60 mL/min (ref >60)
Glucose, Bld: 140 mg/dL — ABNORMAL HIGH (ref 70–99)
Potassium: 3.6 mmol/L (ref 3.5–5.1)
Sodium: 139 mmol/L (ref 135–145)
Total Bilirubin: 0.7 mg/dL (ref 0.3–1.2)
Total Protein: 7.1 g/dL (ref 6.5–8.1)

## 2019-09-14 MED ORDER — SODIUM CHLORIDE 0.9 % IV SOLN
200.0000 mg | Freq: Once | INTRAVENOUS | Status: AC
  Administered 2019-09-14: 200 mg via INTRAVENOUS
  Filled 2019-09-14: qty 8

## 2019-09-14 MED ORDER — SODIUM CHLORIDE 0.9 % IV SOLN
Freq: Once | INTRAVENOUS | Status: AC
  Filled 2019-09-14: qty 250

## 2019-09-14 MED ORDER — SODIUM CHLORIDE 0.9% FLUSH
10.0000 mL | Freq: Once | INTRAVENOUS | Status: AC
  Administered 2019-09-14: 10 mL
  Filled 2019-09-14: qty 10

## 2019-09-14 MED ORDER — SODIUM CHLORIDE 0.9% FLUSH
10.0000 mL | INTRAVENOUS | Status: DC | PRN
  Administered 2019-09-14: 10 mL
  Filled 2019-09-14: qty 10

## 2019-09-14 MED ORDER — HEPARIN SOD (PORK) LOCK FLUSH 100 UNIT/ML IV SOLN
500.0000 [IU] | Freq: Once | INTRAVENOUS | Status: AC | PRN
  Administered 2019-09-14: 500 [IU]
  Filled 2019-09-14: qty 5

## 2019-09-15 ENCOUNTER — Telehealth: Payer: Self-pay | Admitting: Hematology

## 2019-09-15 NOTE — Telephone Encounter (Signed)
Scheduled appt per 12/30 los.  Spoke with pt and she is aware of her appt date and time.

## 2019-09-16 NOTE — Addendum Note (Signed)
Addended by: Truitt Merle on: 09/16/2019 09:07 AM   Modules accepted: Orders

## 2019-09-19 ENCOUNTER — Encounter: Payer: Self-pay | Admitting: Gastroenterology

## 2019-09-27 ENCOUNTER — Other Ambulatory Visit: Payer: Self-pay

## 2019-09-27 DIAGNOSIS — E876 Hypokalemia: Secondary | ICD-10-CM

## 2019-09-27 MED ORDER — POTASSIUM CHLORIDE CRYS ER 20 MEQ PO TBCR: 20.0000 meq | EXTENDED_RELEASE_TABLET | Freq: Every day | ORAL | 2 refills | Status: DC

## 2019-09-28 ENCOUNTER — Other Ambulatory Visit: Payer: Self-pay

## 2019-09-29 ENCOUNTER — Ambulatory Visit (INDEPENDENT_AMBULATORY_CARE_PROVIDER_SITE_OTHER): Payer: Self-pay | Admitting: Family Medicine

## 2019-10-03 ENCOUNTER — Ambulatory Visit (INDEPENDENT_AMBULATORY_CARE_PROVIDER_SITE_OTHER): Payer: Medicare Other | Admitting: Family Medicine

## 2019-10-03 ENCOUNTER — Encounter (INDEPENDENT_AMBULATORY_CARE_PROVIDER_SITE_OTHER): Payer: Self-pay | Admitting: Family Medicine

## 2019-10-03 ENCOUNTER — Other Ambulatory Visit: Payer: Self-pay

## 2019-10-03 VITALS — BP 132/81 | HR 74 | Temp 98.0°F | Ht 63.0 in | Wt 190.0 lb

## 2019-10-03 DIAGNOSIS — R7303 Prediabetes: Secondary | ICD-10-CM

## 2019-10-03 DIAGNOSIS — Z1331 Encounter for screening for depression: Secondary | ICD-10-CM

## 2019-10-03 DIAGNOSIS — E669 Obesity, unspecified: Secondary | ICD-10-CM | POA: Diagnosis not present

## 2019-10-03 DIAGNOSIS — Z6833 Body mass index (BMI) 33.0-33.9, adult: Secondary | ICD-10-CM | POA: Diagnosis not present

## 2019-10-03 DIAGNOSIS — I10 Essential (primary) hypertension: Secondary | ICD-10-CM

## 2019-10-03 DIAGNOSIS — E559 Vitamin D deficiency, unspecified: Secondary | ICD-10-CM | POA: Diagnosis not present

## 2019-10-03 DIAGNOSIS — E039 Hypothyroidism, unspecified: Secondary | ICD-10-CM | POA: Diagnosis not present

## 2019-10-03 DIAGNOSIS — R5383 Other fatigue: Secondary | ICD-10-CM

## 2019-10-03 DIAGNOSIS — R0602 Shortness of breath: Secondary | ICD-10-CM | POA: Diagnosis not present

## 2019-10-03 DIAGNOSIS — Z0289 Encounter for other administrative examinations: Secondary | ICD-10-CM

## 2019-10-04 LAB — LIPID PANEL WITH LDL/HDL RATIO
Cholesterol, Total: 236 mg/dL — ABNORMAL HIGH (ref 100–199)
HDL: 54 mg/dL (ref >39)
LDL Chol Calc (NIH): 157 mg/dL — ABNORMAL HIGH (ref 0–99)
LDL/HDL Ratio: 2.9 ratio (ref 0.0–3.2)
Triglycerides: 137 mg/dL (ref 0–149)
VLDL Cholesterol Cal: 25 mg/dL (ref 5–40)

## 2019-10-04 LAB — HEMOGLOBIN A1C
Est. average glucose Bld gHb Est-mCnc: 108 mg/dL
Hgb A1c MFr Bld: 5.4 % (ref 4.8–5.6)

## 2019-10-04 LAB — VITAMIN D 25 HYDROXY (VIT D DEFICIENCY, FRACTURES): Vit D, 25-Hydroxy: 31.5 ng/mL (ref 30.0–100.0)

## 2019-10-04 LAB — T4, FREE: Free T4: 1.25 ng/dL (ref 0.82–1.77)

## 2019-10-04 LAB — T3: T3, Total: 143 ng/dL (ref 71–180)

## 2019-10-04 LAB — INSULIN, RANDOM: INSULIN: 24 u[IU]/mL (ref 2.6–24.9)

## 2019-10-04 NOTE — Progress Notes (Signed)
Chief Complaint:   OBESITY Dawn [name] (MR# [id number]) is a 69 y.o. female who presents for evaluation and treatment of obesity and related comorbidities. Current BMI is Body mass index is 33.66 kg/m.Marland Kitchen Dawn [name] has been struggling with her weight for many years and has been unsuccessful in either losing weight, maintaining weight loss, or reaching her healthy weight goal.  Dawn [name] is currently in the action stage of change and ready to dedicate time achieving and maintaining a healthier weight. Dawn [name] is interested in becoming our patient and working on intensive lifestyle modifications including (but not limited to) diet and exercise for weight loss.  Dawn [name] was told about our clinic from a new patient at the [facility]. For breakfast she has a muffin with butter and honey, small juice box, and 1 fried egg (feels full for 4 hours); for lunch she has a [place] sandwich (1-2 slices), 1 slice of cheese, mayo, Chobani yogurt and ginger ale (feels full until 4:30); has a banana or nabs for a snack; for dinner has a meat and vegetables.  Dawn [name]'s habits were reviewed today and are as follows: Her family eats meals together, she thinks her family will eat healthier with her, she struggles with family and or coworkers weight loss sabotage, her desired weight loss is 25 lbs, she has been heavy most of her life, she started gaining weight in the last 20 years, her heaviest weight ever was 190 pounds, she is a picky eater and doesn't like to eat healthier foods, she craves sweets, she snacks frequently in the evenings, she frequently makes poor food choices, she has problems with excessive hunger, she frequently eats larger portions than normal, she has binge eating behaviors and she struggles with emotional eating.  Depression Screen Dawn [name]'s Food and Mood (modified PHQ-9) score was 11.  Depression screen PHQ 2/9 10/03/2019  Decreased Interest 1  Down, Depressed, Hopeless 1  PHQ - 2 Score 2    Altered sleeping 0  Tired, decreased energy 2  Change in appetite 3  Feeling bad or failure about yourself  1  Trouble concentrating 2  Moving slowly or fidgety/restless 1  Suicidal thoughts 0  PHQ-9 Score 11  Difficult doing work/chores Somewhat difficult  Some recent data might be hidden   Subjective:   Other fatigue. Dawn [name] denies daytime somnolence and denies waking up still tired. Dawn [name] generally gets 6-8 hours of sleep per night, and states that she generally has restful sleep. Snoring is present. Apneic episodes are not present. Epworth Sleepiness Score is 17.   Shortness of breath on exertion. Dawn [name] notes increasing shortness of breath with exercising and seems to be worsening over time with weight gain. She notes getting out of breath sooner with activity than she used to. This has gotten worse recently. Dawn [name] denies shortness of breath at rest or orthopnea.   Essential hypertension. Blood pressure is controlled. No chest pain or chest pressure.  BP Readings from Last 3 Encounters:  10/03/19 132/81  09/14/19 136/80  08/19/19 (!) 151/86   Lab Results  Component Value Date   CREATININE 0.77 09/14/2019   CREATININE 0.84 08/19/2019   CREATININE 0.78 07/27/2019   Prediabetes. Dawn [name] has a diagnosis of prediabetes based on her elevated HgA1c (HgA1c 5.7 6 years ago) and was informed this puts her at greater risk of developing diabetes. She is on no medication. She continues to work on diet and exercise to decrease her risk of diabetes. She denies nausea or hypoglycemia.  Lab Results  Component Value Date   HGBA1C 5.4 02/09/2019   Vitamin D deficiency. Dawn [name] has a history of calcium diagnosis. No nausea, vomiting, or muscle weakness but does admit to fatigue.  Hypothyroidism, unspecified type. Current symptoms: fatigue.   Lab Results  Component Value Date   TSH 2.205 08/19/2019   Depression screening. Depression screening was done today.  Assessment/Plan:    Other fatigue. Dawn [name] does feel that her weight is causing her energy to be lower than it should be. Fatigue may be related to obesity, depression or many other causes. Labs will be ordered, and in the meanwhile, Dawn [name] will focus on self care including making healthy food choices, increasing physical activity and focusing on stress reduction. EKG 12-Lead showed normal sinus rhythm with a rate of 72 BPM. IC and labs ordered.  Shortness of breath on exertion. Dawn [name] does feel that she gets out of breath more easily that she used to when she exercises. Dawn [name] shortness of breath appears to be obesity related and exercise induced. She has agreed to work on weight loss and gradually increase exercise to treat her exercise induced shortness of breath. Will continue to monitor closely. Lipid Panel With LDL/HDL Ratio ordered.  Essential hypertension. Dawn [name] is working on healthy weight loss and exercise to improve blood pressure control. We will watch for signs of hypotension as she continues her lifestyle modifications. EKG done.  Prediabetes. Dawn [name] will continue to work on weight loss, exercise, and decreasing simple carbohydrates to help decrease the risk of diabetes. Hemoglobin A1c, Insulin, random ordered.  Vitamin D deficiency. Low Vitamin D level contributes to fatigue and are associated with obesity, breast, and colon cancer. She will have VITAMIN D 25 Hydroxy (Vit-D Deficiency, Fractures) level today.  Hypothyroidism, unspecified type. Patient with long-standing hypothyroidism, on levothyroxine therapy. She appears euthyroid. Orders and follow up as documented in patient record. T3, T4, free levels ordered.  Counseling . Good thyroid control is important for overall health. Supratherapeutic thyroid levels are dangerous and will not improve weight loss results. . The correct way to take levothyroxine is fasting, with water, separated by at least 30 minutes from breakfast, and separated by more  than 4 hours from calcium, iron, multivitamins, acid reflux medications (PPIs).    Depression screening. Dawn [name] had a positive depression screening. Depression is commonly associated with obesity and often results in emotional eating behaviors. We will monitor this closely and work on CBT to help improve the non-hunger eating patterns. Referral to Psychology may be required if no improvement is seen as she continues in our clinic.   Class 1 obesity with serious comorbidity and body mass index (BMI) of 33.0 to 33.9 in adult, unspecified obesity type.  Dawn [name] is currently in the action stage of change and her goal is to continue with weight loss efforts. I recommend Dawn [name] begin the structured treatment plan as follows:  She has agreed to the Category 2 Plan.  Exercise goals: Older adults should follow the adult guidelines. When older adults cannot meet the adult guidelines, they should be as physically active as their abilities and conditions will allow.  Older adults should do exercises that maintain or improve balance if they are at risk of falling.    Behavioral modification strategies: increasing lean protein intake, increasing vegetables, meal planning and cooking strategies, keeping healthy foods in the home and planning for success.  She was informed of the importance of frequent follow-up visits to maximize her success with intensive lifestyle modifications for her  multiple health conditions. She was informed we would discuss her lab results at her next visit unless there is a critical issue that needs to be addressed sooner. Dawn [name] agreed to keep her next visit at the agreed upon time to discuss these results.  Objective:   Blood pressure 132/81, pulse 74, temperature 98 F (36.7 C), temperature source Oral, height 5\' 3"  (1.6 m), weight 190 lb (86.2 kg), SpO2 94 %. Body mass index is 33.66 kg/m.  EKG: Sinus  Rhythm with a rate of 72 BPM. Low voltage in precordial leads.  Abnormal.  Indirect Calorimeter completed today shows a VO2 of 171 and a REE of 1193.  Her calculated basal metabolic rate is [id number] thus her basal metabolic rate is worse than expected.  General: Cooperative, alert, well developed, in no acute distress. HEENT: Conjunctivae and lids unremarkable. Cardiovascular: Regular rhythm.  Lungs: Normal work of breathing. Neurologic: No focal deficits.   Lab Results  Component Value Date   CREATININE 0.77 09/14/2019   BUN 11 09/14/2019   NA 139 09/14/2019   K 3.6 09/14/2019   CL 101 09/14/2019   CO2 26 09/14/2019   Lab Results  Component Value Date   ALT 91 (H) 09/14/2019   AST 56 (H) 09/14/2019   ALKPHOS 43 09/14/2019   BILITOT 0.7 09/14/2019   Lab Results  Component Value Date   HGBA1C 5.4 10/03/2019   HGBA1C 5.4 02/09/2019   HGBA1C 5.5 06/07/2018   HGBA1C 5.2 11/19/2017   HGBA1C 5.2 06/04/2017   Lab Results  Component Value Date   INSULIN WILL FOLLOW 10/03/2019   Lab Results  Component Value Date   TSH 2.205 08/19/2019   Lab Results  Component Value Date   CHOL 236 (H) 10/03/2019   HDL 54 10/03/2019   LDLCALC 157 (H) 10/03/2019   TRIG 137 10/03/2019   CHOLHDL 5.0 02/09/2019   Lab Results  Component Value Date   WBC 4.3 09/14/2019   HGB 13.6 09/14/2019   HCT 39.2 09/14/2019   MCV 101.6 (H) 09/14/2019   PLT 143 (L) 09/14/2019   Lab Results  Component Value Date   FERRITIN 176 12/17/2012   Obesity Behavioral Intervention Visit Documentation for Insurance:   Approximately 15 minutes were spent on the discussion below.  ASK: We discussed the diagnosis of obesity with Dawn [name] today and Dawn [name] agreed to give [name] permission to discuss obesity behavioral modification therapy today.  ASSESS: Debe has the diagnosis of obesity and her BMI today is 33.7. Wanza is in the action stage of change.   ADVISE: Saida was educated on the multiple health risks of obesity as well as the benefit of weight loss to improve her  health. She was advised of the need for long term treatment and the importance of lifestyle modifications to improve her current health and to decrease her risk of future health problems.  AGREE: Multiple dietary modification options and treatment options were discussed and Dawn [name] agreed to follow the recommendations documented in the above note.  ARRANGE: Dawn [name] was educated on the importance of frequent visits to treat obesity as outlined per CMS and USPSTF guidelines and agreed to schedule her next follow up appointment today.  Attestation Statements:   Reviewed by clinician on day of visit: allergies, medications, problem list, medical history, surgical history, family history, social history, and previous encounter notes.  I, [name], am acting as transcriptionist for Ilene Qua, MD   I have reviewed the above documentation for accuracy and completeness, and I  agree with the above. - AK

## 2019-10-05 ENCOUNTER — Inpatient Hospital Stay: Payer: Medicare Other

## 2019-10-05 ENCOUNTER — Other Ambulatory Visit: Payer: Self-pay

## 2019-10-05 ENCOUNTER — Inpatient Hospital Stay: Payer: Medicare Other | Attending: Hematology

## 2019-10-05 VITALS — BP 109/53 | HR 75 | Temp 97.7°F | Resp 18 | Wt 193.5 lb

## 2019-10-05 DIAGNOSIS — C50211 Malignant neoplasm of upper-inner quadrant of right female breast: Secondary | ICD-10-CM | POA: Diagnosis not present

## 2019-10-05 DIAGNOSIS — C541 Malignant neoplasm of endometrium: Secondary | ICD-10-CM | POA: Insufficient documentation

## 2019-10-05 DIAGNOSIS — Z170 Estrogen receptor positive status [ER+]: Secondary | ICD-10-CM | POA: Insufficient documentation

## 2019-10-05 DIAGNOSIS — Z95828 Presence of other vascular implants and grafts: Secondary | ICD-10-CM

## 2019-10-05 DIAGNOSIS — Z5112 Encounter for antineoplastic immunotherapy: Secondary | ICD-10-CM | POA: Insufficient documentation

## 2019-10-05 LAB — CBC WITH DIFFERENTIAL/PLATELET
Abs Immature Granulocytes: 0 10*3/uL (ref 0.00–0.07)
Basophils Absolute: 0 10*3/uL (ref 0.0–0.1)
Basophils Relative: 1 %
Eosinophils Absolute: 0.2 10*3/uL (ref 0.0–0.5)
Eosinophils Relative: 5 %
HCT: 38.8 % (ref 36.0–46.0)
Hemoglobin: 13.7 g/dL (ref 12.0–15.0)
Immature Granulocytes: 0 %
Lymphocytes Relative: 42 %
Lymphs Abs: 1.6 10*3/uL (ref 0.7–4.0)
MCH: 35.1 pg — ABNORMAL HIGH (ref 26.0–34.0)
MCHC: 35.3 g/dL (ref 30.0–36.0)
MCV: 99.5 fL (ref 80.0–100.0)
Monocytes Absolute: 0.3 10*3/uL (ref 0.1–1.0)
Monocytes Relative: 9 %
Neutro Abs: 1.6 10*3/uL — ABNORMAL LOW (ref 1.7–7.7)
Neutrophils Relative %: 43 %
Platelets: 156 10*3/uL (ref 150–400)
RBC: 3.9 MIL/uL (ref 3.87–5.11)
RDW: 11.8 % (ref 11.5–15.5)
WBC: 3.8 10*3/uL — ABNORMAL LOW (ref 4.0–10.5)
nRBC: 0 % (ref 0.0–0.2)

## 2019-10-05 LAB — COMPREHENSIVE METABOLIC PANEL
ALT: 115 U/L — ABNORMAL HIGH (ref 0–44)
AST: 81 U/L — ABNORMAL HIGH (ref 15–41)
Albumin: 3.9 g/dL (ref 3.5–5.0)
Alkaline Phosphatase: 42 U/L (ref 38–126)
Anion gap: 9 (ref 5–15)
BUN: 14 mg/dL (ref 8–23)
CO2: 26 mmol/L (ref 22–32)
Calcium: 9 mg/dL (ref 8.9–10.3)
Chloride: 103 mmol/L (ref 98–111)
Creatinine, Ser: 0.79 mg/dL (ref 0.44–1.00)
GFR calc Af Amer: >60 mL/min (ref >60)
GFR calc non Af Amer: >60 mL/min (ref >60)
Glucose, Bld: 142 mg/dL — ABNORMAL HIGH (ref 70–99)
Potassium: 3.7 mmol/L (ref 3.5–5.1)
Sodium: 138 mmol/L (ref 135–145)
Total Bilirubin: 0.7 mg/dL (ref 0.3–1.2)
Total Protein: 7.2 g/dL (ref 6.5–8.1)

## 2019-10-05 MED ORDER — SODIUM CHLORIDE 0.9% FLUSH
10.0000 mL | INTRAVENOUS | Status: DC | PRN
  Administered 2019-10-05: 10 mL
  Filled 2019-10-05: qty 10

## 2019-10-05 MED ORDER — SODIUM CHLORIDE 0.9% FLUSH
10.0000 mL | INTRAVENOUS | Status: DC | PRN
  Administered 2019-10-05: 10 mL via INTRAVENOUS
  Filled 2019-10-05: qty 10

## 2019-10-05 MED ORDER — HEPARIN SOD (PORK) LOCK FLUSH 100 UNIT/ML IV SOLN
500.0000 [IU] | Freq: Once | INTRAVENOUS | Status: AC | PRN
  Administered 2019-10-05: 500 [IU]
  Filled 2019-10-05: qty 5

## 2019-10-05 MED ORDER — SODIUM CHLORIDE 0.9 % IV SOLN
200.0000 mg | Freq: Once | INTRAVENOUS | Status: AC
  Administered 2019-10-05: 200 mg via INTRAVENOUS
  Filled 2019-10-05: qty 8

## 2019-10-05 MED ORDER — SODIUM CHLORIDE 0.9 % IV SOLN
Freq: Once | INTRAVENOUS | Status: AC
  Filled 2019-10-05: qty 250

## 2019-10-05 NOTE — Progress Notes (Signed)
Per [name], okay for patient to receive treatment with AST 81 and ALT 115

## 2019-10-05 NOTE — Patient Instructions (Signed)

## 2019-10-17 ENCOUNTER — Ambulatory Visit (INDEPENDENT_AMBULATORY_CARE_PROVIDER_SITE_OTHER): Payer: Medicare Other | Admitting: Family Medicine

## 2019-10-17 ENCOUNTER — Other Ambulatory Visit: Payer: Self-pay

## 2019-10-17 ENCOUNTER — Encounter (INDEPENDENT_AMBULATORY_CARE_PROVIDER_SITE_OTHER): Payer: Self-pay | Admitting: Family Medicine

## 2019-10-17 VITALS — BP 121/72 | HR 97 | Temp 97.7°F | Ht 63.0 in | Wt 186.0 lb

## 2019-10-17 DIAGNOSIS — E669 Obesity, unspecified: Secondary | ICD-10-CM

## 2019-10-17 DIAGNOSIS — E559 Vitamin D deficiency, unspecified: Secondary | ICD-10-CM | POA: Diagnosis not present

## 2019-10-17 DIAGNOSIS — Z6833 Body mass index (BMI) 33.0-33.9, adult: Secondary | ICD-10-CM | POA: Diagnosis not present

## 2019-10-17 DIAGNOSIS — R7989 Other specified abnormal findings of blood chemistry: Secondary | ICD-10-CM | POA: Diagnosis not present

## 2019-10-17 DIAGNOSIS — E7849 Other hyperlipidemia: Secondary | ICD-10-CM | POA: Diagnosis not present

## 2019-10-17 DIAGNOSIS — E8881 Metabolic syndrome: Secondary | ICD-10-CM

## 2019-10-17 MED ORDER — VITAMIN D (ERGOCALCIFEROL) 1.25 MG (50000 UNIT) PO CAPS: 50000.0000 [IU] | ORAL_CAPSULE | ORAL | 0 refills | Status: DC

## 2019-10-17 MED FILL — VIT D2 1.25 MG (50,000 UNIT: 1.25 MG | 28 days supply | Qty: 4 | Fill #0

## 2019-10-18 ENCOUNTER — Ambulatory Visit (INDEPENDENT_AMBULATORY_CARE_PROVIDER_SITE_OTHER): Payer: Medicare Other | Admitting: Gastroenterology

## 2019-10-18 ENCOUNTER — Encounter: Payer: Self-pay | Admitting: Gastroenterology

## 2019-10-18 VITALS — BP 130/80 | HR 85 | Temp 96.7°F | Ht 63.0 in | Wt 189.0 lb

## 2019-10-18 DIAGNOSIS — Z800 Family history of malignant neoplasm of digestive organs: Secondary | ICD-10-CM | POA: Diagnosis not present

## 2019-10-18 DIAGNOSIS — Z01818 Encounter for other preprocedural examination: Secondary | ICD-10-CM

## 2019-10-18 DIAGNOSIS — K219 Gastro-esophageal reflux disease without esophagitis: Secondary | ICD-10-CM

## 2019-10-18 DIAGNOSIS — I864 Gastric varices: Secondary | ICD-10-CM

## 2019-10-18 MED ORDER — OMEPRAZOLE 20 MG PO CPDR: DELAYED_RELEASE_CAPSULE | ORAL | 0 refills | Status: DC

## 2019-10-18 MED ORDER — SUPREP BOWEL PREP KIT 17.5-3.13-1.6 GM/177ML PO SOLN: 1.0000 | ORAL | 0 refills | Status: DC

## 2019-10-18 MED FILL — SUPREP BOWEL PREP KIT: 17.5-3.13-1 | 2 days supply | Qty: 354 | Fill #0

## 2019-10-18 NOTE — Progress Notes (Signed)
HPI: This is a very pleasant 69 year old woman who was referred to me by Dawn [name], *  to evaluate family history colon cancer, possible GERD.    I am meeting her today for the first time.  She was previously a patient of one of my partners.  She is due for elevated risk colon cancer screening given her family history of colon cancer.  Her father had colon cancer diagnosed likely in his 22s.  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  The breast cancer was treated by lumpectomy, chemotherapy and radiation.  The endometrial cancer was treated by total abdominal hysterectomy and bilateral oophorectomy, internal radiation therapy and now chronic immunotherapy delivered via left-sided port.  There was mention on May 2018 staging CT scans that she had a gastrorenal shunt creating gastric varices.  That has not been noted or described on any imaging since then.  She has certainly never had any overt GI bleeding.  She does have some upper back burning sensation at times.  She has no dysphagia.  She only very rarely has pyrosis.  She has been gaining weight.  She was told she had fatty liver long ago  Old Data Reviewed: Previously a patient of Dr. Olevia Perches; she has undergone 3 colonoscopies over the past 20 years or so.  At one point a small subcentimeter hyperplastic polyp was removed.  She has also been found to have diverticulosis of her colon.  Her most recent colonoscopy was February 2016.  No polyps were found.  Her father had colon cancer.  In 2018 she was diagnosed with both endometrial and breast cancer.  Blood work January 2020 shows essentially normal CBC, complete metabolic profile was normal except for slightly elevated transaminases    Review of systems: Pertinent positive and negative review of systems were noted in the above HPI section. All other review negative.   Past Medical History:  Diagnosis Date  . Anemia    as a child.   . Arthritis   . Bilateral cataracts   . Bilateral leg cramps   . Cancer ([place])    skin  . Dyspnea   . Endometrial cancer ([place])   . Family history of bladder cancer   . Family history of colon cancer in father   . Fatty liver   . Fuchs' corneal dystrophy   . GERD (gastroesophageal reflux disease)   . Hearing loss    Right side 30%  . Heartburn   . History of hiatal hernia    small size  . History of radiation therapy 08/21/17, 08/28/17,09/01/17, 09/11/17, 09/17/17   Vaginal cuff brachytherapy.   Marland Kitchen History of radiation therapy 11/11/17- 12/08/17   Right Breast treated to 40.05 Gy with 15 fx of 2.67 Gy and a boost of 10 Gy with 5 fx.   . Hyperlipidemia   . Hypertension   . Hypothyroidism   . IBS (irritable bowel syndrome)    hx of  . 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 NAFL (nonalcoholic fatty liver)   . Obesity   . Osteoarthritis   . Osteopenia   . PONV (postoperative nausea and vomiting)   . 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 Uterine cancer ([place]) 04/2017   endometrial cancer  . Varices, gastric   . Vertigo     Past Surgical History:  Procedure Laterality Date  . BREAST BIOPSY Right 04/27/2017  .  BREAST LUMPECTOMY WITH RADIOACTIVE SEED AND SENTINEL LYMPH NODE BIOPSY Right 05/25/2017   Procedure: RIGHT BREAST LUMPECTOMY WITH RADIOACTIVE SEED AND  RIGHT AXILLARY SENTINEL LYMPH NODE BIOPSY;  Surgeon: Excell Seltzer, MD;  Location: [facility];  Service: General;  Laterality: Right;  . CATARACT EXTRACTION, BILATERAL    . COLONOSCOPY    . HYSTEROSCOPY WITH D & C N/A 04/29/2017   Procedure: DILATATION AND CURETTAGE /HYSTEROSCOPY;  Surgeon: Linda Hedges, DO;  Location: [facility];  Service: Gynecology;  Laterality: N/A;  . PORTACATH PLACEMENT Left 05/25/2017   Procedure: INSERTION PORT-A-CATH;  Surgeon: Excell Seltzer, MD;  Location: [facility];  Service: General;   Laterality: Left;  . ROBOTIC ASSISTED TOTAL HYSTERECTOMY WITH BILATERAL SALPINGO OOPHERECTOMY N/A 06/09/2017   Procedure: XI ROBOTIC ASSISTED TOTAL LAPOROSCOPIC HYSTERECTOMY WITH BILATERAL SALPINGO OOPHORECTOMY;  Surgeon: Everitt Amber, MD;  Location: [facility];  Service: Gynecology;  Laterality: N/A;  . SENTINEL NODE BIOPSY N/A 06/09/2017   Procedure: SENTINEL NODE BIOPSY;  Surgeon: Everitt Amber, MD;  Location: [facility];  Service: Gynecology;  Laterality: N/A;    Current Outpatient Medications  Medication Sig Dispense Refill  . acetaminophen (TYLENOL) 500 MG tablet Take 500 mg by mouth every 6 (six) hours as needed for moderate pain.    Marland Kitchen anastrozole (ARIMIDEX) 1 MG tablet Take 1 tablet (1 mg total) by mouth daily. 90 tablet 3  . Ascorbic Acid (VITAMIN C) 1000 MG tablet Take 1,000 mg by mouth daily.    Marland Kitchen b complex vitamins capsule Take 1 capsule by mouth daily.    . diphenhydrAMINE (BENADRYL) 25 MG tablet Take 25-50 mg by mouth every 6 (six) hours as needed for sleep.    . fluticasone (FLONASE) 50 MCG/ACT nasal spray Place 2 sprays into both nostrils daily.    Marland Kitchen ibandronate (BONIVA) 150 MG tablet Take 1 tablet (150 mg total) by mouth every 30 (thirty) days. 3 tablet 3  . levothyroxine (SYNTHROID) 50 MCG tablet TAKE 1 TABLET BY MOUTH  DAILY 90 tablet 3  . lidocaine-prilocaine (EMLA) cream APPLY TO AFFECTED AREA ONCE 30 g 2  . Multiple Vitamin (MULTIVITAMIN) capsule Take 1 capsule by mouth daily.      Marland Kitchen olmesartan-hydrochlorothiazide (BENICAR HCT) 20-12.5 MG tablet Take 1 tablet by mouth daily. 90 tablet 2  . potassium chloride SA (KLOR-CON) 20 MEQ tablet Take 20 mEq by mouth once.    . sodium chloride (MURO 128) 5 % ophthalmic solution Place 2 drops into both eyes daily.    Marland Kitchen triamcinolone cream (KENALOG) 0.1 % APPLY TO ITCHY SPOTS ONCE DAILY  1  . Vitamin D, Ergocalciferol, (DRISDOL) 1.25 MG (50000 UNIT) CAPS capsule Take 1 capsule (50,000 Units total) by mouth every 7 (seven) days. 4 capsule 0   No  current facility-administered medications for this visit.   Facility-Administered Medications Ordered in Other Visits  Medication Dose Route Frequency Provider Last Rate Last Admin  . sodium chloride flush (NS) 0.9 % injection 10 mL  10 mL Intracatheter PRN Truitt Merle, MD   10 mL at 08/25/18 1111    Allergies as of 10/18/2019 - Review Complete 10/18/2019  Allergen Reaction Noted  . Ciprofloxacin Hives 05/25/2017  . Paclitaxel Rash 11/19/2017  . Crestor [rosuvastatin calcium] Other (See Comments) 09/26/2011  . Fosamax [alendronate sodium] Other (See Comments) 11/19/2017  . Penicillins Rash 04/22/2007    Family History  Problem Relation Age of Onset  . Colon cancer Father 28  . Heart attack Father 75  . Prostate  cancer Father        dx 3's  . Bladder Cancer Father 67  . Hypertension Father   . Hyperlipidemia Father   . Heart disease Father   . Obesity Father   . Stroke Mother 21  . Depression Mother   . Anxiety disorder Mother   . Bipolar disorder Mother   . Obesity Mother   . Diabetes Maternal Grandfather   . Kidney disease Maternal Grandfather   . Asthma Brother   . Cancer Paternal Grandmother 67       GYN cancer ( thinks ovarian, maybe uterine)  . Heart attack Maternal Grandmother 70  . Breast cancer Other 53  . Colon cancer Other 27  . Cancer Other        type unk, age dx unk    Social History   Socioeconomic History  . Marital status: Married    Spouse name: Not on file  . Number of children: Not on file  . Years of education: Not on file  . 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 Smoking status: Never Smoker  . Smokeless tobacco: Never Used  Substance and Sexual Activity  . Alcohol use: Yes    Alcohol/week: 0.0 standard drinks    Comment: <1/week wine or beer occasional  . Drug use: No  . Sexual activity: Yes    Birth control/protection:  Post-menopausal  Other Topics Concern  . Not on file  Social History Narrative   2-3 caffeine drinks per day. Regular exercise.  Walking 3 miles a day.    Social Determinants of Health   Financial Resource Strain:   . Difficulty of Paying Living Expenses: Not on file  Food Insecurity:   . Worried About Charity fundraiser in the Last Year: Not on file  . Ran Out of Food in the Last Year: Not on file  Transportation Needs:   . Lack of Transportation (Medical): Not on file  . Lack of Transportation (Non-Medical): Not on file  Physical Activity:   . Days of Exercise per Week: Not on file  . Minutes of Exercise per Session: Not on file  Stress:   . Feeling of Stress : Not on file  Social Connections:   . Frequency of Communication with Friends and Family: Not on file  . Frequency of Social Gatherings with Friends and Family: Not on file  . Attends Religious Services: Not on file  . Active Member of Clubs or Organizations: Not on file  . Attends Archivist Meetings: Not on file  . Marital Status: Not on file  Intimate Partner Violence:   . Fear of Current or Ex-Partner: Not on file  . Emotionally Abused: Not on file  . Physically Abused: Not on file  . Sexually Abused: Not on file     Physical Exam: BP 130/80   Pulse 85   Temp (!) 96.7 F (35.9 C)   Ht 5\' 3"  (1.6 m)   Wt 189 lb (85.7 kg)   BMI 33.48 kg/m  Constitutional: generally well-appearing Psychiatric: alert and oriented x3 Eyes: extraocular movements intact Mouth: oral pharynx moist, no lesions Neck: supple no lymphadenopathy Cardiovascular: heart regular rate and rhythm Lungs: clear to auscultation bilaterally Abdomen: soft, nontender, nondistended, no obvious ascites, no peritoneal signs, normal bowel sounds Extremities: no lower extremity edema bilaterally Skin: no lesions on visible extremities  Assessment and plan: 69 y.o. female with family history colon cancer, possible underlying GERD,  possible gastric varices  She is clearly due for elevated risk colon cancer screening given her family history of colon cancer we will arrange for colonoscopy at her soonest convenience.  At the same time I think it would be reasonable to examine her stomach.  She does have some mild bilateral upper back burning sensations that are somewhat related to eating.  Possibly these are GERD it would be a pretty unusual symptom for GERD however.  I recommended a trial of over-the-counter strength omeprazole 1 pill once daily shortly before her dinner meal to see if that helps her symptoms.  I think would also be instructive to examine her stomach given her previously noted "gastric varices" on 2018 CT scan.  I see no reason for any further blood tests or imaging studies prior to that.    Please see the "Patient Instructions" section for addition details about the plan.   Owens Loffler, MD [facility] 10/18/2019, 9:43 AM  Cc: Dawn [name], *  Total time on date of encounter was 45  minutes (this included time spent preparing to see the patient reviewing records; obtaining and/or reviewing separately obtained history; performing a medically appropriate exam and/or evaluation; counseling and educating the patient and family if present; ordering medications, tests or procedures if applicable; and documenting clinical information in the health record).

## 2019-10-18 NOTE — Patient Instructions (Signed)
If you are age 67 or older, your body mass index should be between 23-30. Your Body mass index is 33.48 kg/m. If this is out of the aforementioned range listed, please consider follow up with your Primary Care Provider.  If you are age 15 or younger, your body mass index should be between 19-25. Your Body mass index is 33.48 kg/m. If this is out of the aformentioned range listed, please consider follow up with your Primary Care Provider.   You have been scheduled for an endoscopy and colonoscopy. Please follow the written instructions given to you at your visit today. Please pick up your prep supplies at the pharmacy within the next 1-3 days. If you use inhalers (even only as needed), please bring them with you on the day of your procedure.  Please purchase the following medications over the counter and take as directed:  Omeprazole 20mg  over-the-counter 1 tablet shortly before dinner meal.  Thank you, Dr Ardis Hughs

## 2019-10-18 NOTE — Progress Notes (Signed)
Chief Complaint:   OBESITY Dawn [name] is here to discuss her progress with her obesity treatment plan along with follow-up of her obesity related diagnoses. Dawn [name] is on the Category 2 Plan and states she is following her eating plan approximately 80% of the time. Dawn [name] states she is walking 2 to 3 miles, 4 to 5 times per week.  Today's visit was #: 2 Starting weight: 190 lbs Starting date: 10/03/2019 Today's weight: 186 lbs Today's date: 10/17/2019 Total lbs lost to date: 4 Total lbs lost since last in-office visit: 4  Interim History: Dawn [name] misses sweet tea and she doesn't like the bread. She does make indulgent choices one day a week. Dawn [name] went to the beach with her sister and really tried to watch what she was eating. She ate pistachios for a snack. She has occasional hunger, between lunch and dinner, but she still struggles with getting all of the protein in at night.  Subjective:   Elevated LFTs Dawn [name] has a diagnosis of elevated ALT. Her last AST was 81 and last ALT was 115 (slight increase from previously). Her last ultrasound was in 2014. Dawn [name] has a CT scheduled for Monday.   Lab Results  Component Value Date   ALT 115 (H) 10/05/2019   AST 81 (H) 10/05/2019   ALKPHOS 42 10/05/2019   BILITOT 0.7 10/05/2019   Other hyperlipidemia Dawn [name] has hyperlipidemia and her last LDL was 157 (10/03/19). She has elevated LFT's. Dawn [name] is supposed to be taking medication, but patient is not taking the medication, due to myalgias. She has been trying to improve her cholesterol levels with intensive lifestyle modification including a low saturated fat diet, exercise and weight loss. Labs were discussed with patient today.  Lab Results  Component Value Date   ALT 115 (H) 10/05/2019   AST 81 (H) 10/05/2019   ALKPHOS 42 10/05/2019   BILITOT 0.7 10/05/2019   Lab Results  Component Value Date   CHOL 236 (H) 10/03/2019   HDL 54 10/03/2019   LDLCALC 157 (H) 10/03/2019   TRIG  137 10/03/2019   CHOLHDL 5.0 02/09/2019   Vitamin D deficiency  Dawn [name]'s Vitamin D level was 31.5 on 10/03/19. She is currently taking calcium with vit D supplement, but she is unsure of the dosage. She admits fatigue. Labs were discussed with patient today.  Insulin resistance (new) Dawn [name] has a new diagnosis of insulin resistance based on her elevated fasting insulin level of 24.0 (10/03/19). Her last A1c was at 5.4 (10/03/19). Dawn [name] is not on medications. She continues to work on diet and exercise to decrease her risk of diabetes. Labs were discussed with patient today.  Lab Results  Component Value Date   INSULIN 24.0 10/03/2019   Lab Results  Component Value Date   HGBA1C 5.4 10/03/2019    Assessment/Plan:   Elevated LFTs We discussed the likely diagnosis of non-alcoholic fatty liver disease today and how this condition is obesity related. Dawn [name] was educated the importance of weight loss. Dawn [name] agreed to continue with her weight loss efforts with healthier diet and exercise as an essential part of her treatment plan. We will follow up CT results at the next appointment.  Other hyperlipidemia Cardiovascular risk and specific lipid/LDL goals reviewed.  We discussed several lifestyle modifications today and Nachelle will continue to work on diet, exercise and weight loss efforts. We will follow up fasting lipid panel in 3 months. Orders and follow up as documented in patient record.   Counseling  Intensive lifestyle modifications are the first line treatment for this issue. . Dietary changes: Increase soluble fiber. Decrease simple carbohydrates. . Exercise changes: Moderate to vigorous-intensity aerobic activity 150 minutes per week if tolerated. . Lipid-lowering medications: see documented in medical record.  Vitamin D deficiency  Low Vitamin D level contributes to fatigue and are associated with obesity, breast, and colon cancer. Dawn [name] agrees to take prescription Vitamin D @50 ,000  IU every week #4 with no refills and she will follow-up for routine testing of Vitamin D, at least 2-3 times per year to avoid over-replacement.  Insulin resistance (new) Dawn [name] will continue to work on weight loss, exercise, and decreasing simple carbohydrates to help decrease the risk of diabetes. We will repeat labs in 3 months. No medication will be prescribed at this time. Dawn [name] agreed to follow-up with [name] as directed to closely monitor her progress.  Class 1 obesity with serious comorbidity and body mass index (BMI) of 33.0 to 33.9 in adult, unspecified obesity type Dawn [name] is currently in the action stage of change. As such, her goal is to continue with weight loss efforts. She has agreed to the Category 2 Plan.   Exercise goals: Dawn [name] will continue her current exercise regimen.  Behavioral modification strategies: increasing lean protein intake, increasing vegetables, meal planning and cooking strategies and planning for success.  Dawn [name] has agreed to follow-up with our clinic in 2 weeks. She was informed of the importance of frequent follow-up visits to maximize her success with intensive lifestyle modifications for her multiple health conditions.   Objective:   Blood pressure 121/72, pulse 97, temperature 97.7 F (36.5 C), temperature source Oral, height 5\' 3"  (1.6 m), weight 186 lb (84.4 kg), SpO2 95 %. Body mass index is 32.95 kg/m.  General: Cooperative, alert, well developed, in no acute distress. HEENT: Conjunctivae and lids unremarkable. Cardiovascular: Regular rhythm.  Lungs: Normal work of breathing. Neurologic: No focal deficits.   Lab Results  Component Value Date   CREATININE 0.79 10/05/2019   BUN 14 10/05/2019   NA 138 10/05/2019   K 3.7 10/05/2019   CL 103 10/05/2019   CO2 26 10/05/2019   Lab Results  Component Value Date   ALT 115 (H) 10/05/2019   AST 81 (H) 10/05/2019   ALKPHOS 42 10/05/2019   BILITOT 0.7 10/05/2019   Lab Results  Component Value  Date   HGBA1C 5.4 10/03/2019   HGBA1C 5.4 02/09/2019   HGBA1C 5.5 06/07/2018   HGBA1C 5.2 11/19/2017   HGBA1C 5.2 06/04/2017   Lab Results  Component Value Date   INSULIN 24.0 10/03/2019   Lab Results  Component Value Date   TSH 2.205 08/19/2019   Lab Results  Component Value Date   CHOL 236 (H) 10/03/2019   HDL 54 10/03/2019   LDLCALC 157 (H) 10/03/2019   TRIG 137 10/03/2019   CHOLHDL 5.0 02/09/2019   Lab Results  Component Value Date   WBC 3.8 (L) 10/05/2019   HGB 13.7 10/05/2019   HCT 38.8 10/05/2019   MCV 99.5 10/05/2019   PLT 156 10/05/2019   Lab Results  Component Value Date   FERRITIN 176 12/17/2012    Obesity Behavioral Intervention Documentation for Insurance:   Approximately 15 minutes were spent on the discussion below.  ASK: We discussed the diagnosis of obesity with Dawn [name] today and Dawn [name] agreed to give [name] permission to discuss obesity behavioral modification therapy today.  ASSESS: Amulya has the diagnosis of obesity and her BMI today is 32.96. Lilija is  in the action stage of change.   ADVISE: Dawn [name] was educated on the multiple health risks of obesity as well as the benefit of weight loss to improve her health. She was advised of the need for long term treatment and the importance of lifestyle modifications to improve her current health and to decrease her risk of future health problems.  AGREE: Multiple dietary modification options and treatment options were discussed and Dawn [name] agreed to follow the recommendations documented in the above note.  ARRANGE: Dawn [name] was educated on the importance of frequent visits to treat obesity as outlined per CMS and USPSTF guidelines and agreed to schedule her next follow up appointment today.  Attestation Statements:   Reviewed by clinician on day of visit: allergies, medications, problem list, medical history, surgical history, family history, social history, and previous encounter notes.  I, Doreene Nest, am acting as transcriptionist for Eber Jones, MD.  I have reviewed the above documentation for accuracy and completeness, and I agree with the above. - Ilene Qua, MD

## 2019-10-19 NOTE — Progress Notes (Signed)
Dawn [name]   [phone] [phone]   Clinic Follow up Note   Patient Care Team: [name], MD as PCP - Haskell Riling, MD (Inactive) as Consulting Physician (General Surgery) Truitt Merle, MD as Consulting Physician (Hematology) [name], MD as Attending Physician (Radiation Oncology)  Date of Service:  10/26/2019  CHIEF COMPLAINT: F/u on breast and endometrial cancers  SUMMARY OF ONCOLOGIC HISTORY: Oncology History Overview Note  MSI high Cancer Staging Endometrial cancer [facility]) Staging form: Corpus Uteri - Adenosarcoma, AJCC 8th Edition - Pathologic stage from 06/09/2017: Stage IIIC (pT1a, pN1, cM0) - Signed by Truitt Merle, MD on 06/16/2017  Malignant neoplasm of upper-inner quadrant of right breast in female, estrogen receptor positive ([place]) Staging form: Breast, AJCC 8th Edition - Clinical stage from 05/06/2017: Stage IA (cT1c, cN0, cM0, G2, ER: Positive, PR: Negative, HER2: Positive) - Unsigned - Pathologic stage from 05/25/2017: Stage IIA (pT2, pN0, cM0, G2, ER: Positive, PR: Negative, HER2: Negative) - Signed by Truitt Merle, MD on 06/16/2017     Malignant neoplasm of upper-inner quadrant of right breast in female, estrogen receptor positive ([place])  04/27/2017 Initial Biopsy   Diagnosis Breast, right, needle core biopsy INVASIVE DUCTAL CARCINOMA, GRADE 2 Microscopic Comment The neoplasm has intracellular mucin and signet ring cell features. Immunostains shows these cells are positive for ER,GATA3, ck7 and GCDFP, negative for ck20, cdx2, TTF-1 and pax8, The immunostaining pattern supports the neoplasm is breast primary.   04/27/2017 Receptors her2   Estrogen Receptor: 70%, POSITIVE, MODERATE STAINING INTENSITY Progesterone Receptor: 0%, NEGATIVE Proliferation Marker Ki67: 30% HER2 - **POSITIVE** RATIO OF HER2/CEP17 SIGNALS 2.91 AVERAGE HER2 COPY NUMBER PER CELL 7.28   04/27/2017 Initial Diagnosis   Malignant  neoplasm of upper-inner quadrant of right breast in female, estrogen receptor positive ([place])   05/07/2017 Imaging   CT Chest W Contrast 05/07/17 IMPRESSION: Tiny well-defined fatty lesion on the pleura at the right lung base. The thinner slice collimation used for today's chest CT eliminates volume-averaging seen in the lesion on the prior exam and confirms that this is a diffusely fatty nodule. This is a benign finding and likely represents a tiny lipoma. No defect in the hemidiaphragm evident to suggest tiny diaphragmatic hernia. Pulmonary hamartoma a consideration although the lack of soft tissue components makes this less likely.   05/15/2017 Genetic Testing   Patient had genetic testing due to a personal history of breast cancer and uterine cancer as well as a family history of cancer. The Multi-Cancer panel was ordered. The Multi-Cancer Panel offered by [facility] includes sequencing and/or deletion duplication testing of the following 83 genes: ALK, APC, ATM, AXIN2,BAP1,  BARD1, BLM, BMPR1A, BRCA1, BRCA2, BRIP1, CASR, CDC73, CDH1, CDK4, CDKN1B, CDKN1C, CDKN2A (p14ARF), CDKN2A (p16INK4a), CEBPA, CHEK2, CTNNA1, DICER1, DIS3L2, EGFR (c.2369C>T, p.Thr790Met variant only), EPCAM (Deletion/duplication testing only), FH, FLCN, GATA2, GPC3, GREM1 (Promoter region deletion/duplication testing only), HOXB13 (c.251G>A, p.Gly84Glu), HRAS, KIT, MAX, MEN1, MET, MITF (c.952G>A, p.Glu318Lys variant only), MLH1, MSH2, MSH3, MSH6, MUTYH, NBN, NF1, NF2, NTHL1, PALB2, PDGFRA, PHOX2B, PMS2, POLD1, POLE, POT1, PRKAR1A, PTCH1, PTEN, RAD50, RAD51C, RAD51D, RB1, RECQL4, RET, RUNX1, SDHAF2, SDHA (sequence changes only), SDHB, SDHC, SDHD, SMAD4, SMARCA4, SMARCB1, SMARCE1, STK11, SUFU, TERC, TERT, TMEM127, TP53, TSC1, TSC2, VHL, WRN and WT1.   Results: No pathogenic mutations were identified. A VUS in the GATA2 gene c.1348G>A (p.Gly450Arg) was identified.  The date of this test report is 05/25/2017.     05/25/2017 Surgery     RIGHT BREAST LUMPECTOMY WITH  RADIOACTIVE SEED AND  RIGHT AXILLARY SENTINEL LYMPH NODE BIOPSY and Pot placement by Dr. Excell Seltzer 05/25/17   05/25/2017 Pathology Results   Diagnosis 05/25/17 1. Breast, lumpectomy, right - INVASIVE DUCTAL CARCINOMA, GRADE II/III, SPANNING 2.2 CM. - DUCTAL CARCINOMA IN SITU, HIGH GRADE. - THE SURGICAL RESECTION MARGINS ARE NEGATIVE FOR CARCINOMA. - SEE ONCOLOGY TABLE BELOW. 2. Breast, excision, right additional superior margin - BENIGN FIBROADIPOSE TISSUE. - BENIGN SKELETAL MUSCLE. - SEE COMMENT. 3. Breast, excision, right additional lateral margin - BENIGN BREAST PARENCHYMA. - THERE IS NO EVIDENCE OF MALIGNANCY. - SEE COMMENT. 4. Breast, excision, chest wall margin - BENIGN FIBROADIPOSE TISSUE. - THERE IS NO EVIDENCE OF MALIGNANCY. - SEE COMMENT. 5. Lymph node, sentinel, biopsy, right axillary - THERE IS NO EVIDENCE OF CARCINOMA IN 1 OF 1 LYMPH NODE (0/1). 6. Lymph node, sentinel, biopsy, right axillary - THERE IS NO EVIDENCE OF CARCINOMA IN 1 OF 1 LYMPH NODE (0/1). 7. Lymph node, sentinel, biopsy, right axillary - THERE IS NO EVIDENCE OF CARCINOMA IN 1 OF 1 LYMPH NODE (0/1).   06/26/2017 PET scan   PET  IMPRESSION: 1. Postoperative findings both in the right breast and in the anatomic pelvis, with associated low-grade activity considered to be postoperative in nature. No hypermetabolic adenopathy or hypermetabolic lesions are identified to suggest active metastatic disease/malignancy. 2. Other imaging findings of potential clinical significance: Aortic Atherosclerosis (ICD10-I70.0). Sigmoid colon diverticulosis. Biapical pleuroparenchymal scarring in the lungs. Small amount of free pelvic fluid in the cul-de-sac, likely postoperative.    07/01/2017 - 10/14/2017 Chemotherapy   Adjuvant TCH with Onpro every 3 weeks for 6 cycles starting on 07/01/17, Changed Taxol to abraxane with cycle 4 due to drug rash reaction, followed by Herceptin every 3  weeks for 6 months    11/01/2017 - 12/08/2017 Radiation Therapy   Radiation therapy to her right breast with [name]   11/04/2017 - 06/2018 Chemotherapy   Maintenance Herceptin starting 11/04/17 and will comeplete her 12 months in 06/2018    12/23/2017 -  Anti-estrogen oral therapy   Adjuvant letrozole 2.5 mg daily started 12/23/17, switched to exemestane on 04/21/18 due to joint pain. Could not afford aromasin, started anastrozole 04/2018.    12/30/2017 Imaging   CT CAP W Contrast 12/30/17 IMPRESSION: Increased size of 1.1 cm aorto-caval retroperitoneal lymph node. This could be reactive due to interval hysterectomy, however metastatic carcinoma cannot be excluded. Consider continued attention on short-term follow-up CT, or PET-CT scan for further evaluation.  Mild hepatic steatosis.   01/25/2018 PET scan   IMPRESSION: 1. Enlarging hypermetabolic aortocaval lymph node is worrisome for metastatic disease. 2. Probable postoperative seroma in the medial right breast, with associated mild inflammatory hypermetabolism.   02/17/2018 -  Antibody Plan   Keytruda every 3 weeks starting 02/17/18. Plan for 2 years    04/28/2018 Mammogram   Probably benign. Calcifications in the right breast most likely are dystrophic, related to previous surgery, and are probably benign.    04/28/2018 Imaging   DEXA Scan T Score -2.0   06/10/2018 Echocardiogram   06/10/2018 ECHO LV EF: 55% -   60%   08/28/2018 - 08/2018 Chemotherapy   Neratinib 17m for 1 week then titrate up with 1 additional tablet weekly up to 12 mg starting 08/28/18. Stopped soon after starting due to diarrhea.   10/25/2018 Imaging   CT CAP 10/25/18  IMPRESSION: 1. No evidence of metastatic disease. 2.  Aortic atherosclerosis (ICD10-170.0).   06/15/2019 Pathology Results   Diagnosis Breast, right,  needle core biopsy, 1 o'clock, 7cmfn - DENSE FIBROSIS CONSISTENT WITH PRIOR PROCEDURAL CHANGES. - NO MALIGNANCY IDENTIFIED.   10/24/2019 Imaging    CT CAP W Contrast  IMPRESSION: 1. Stable exam. No evidence of recurrent or metastatic carcinoma within the chest, abdomen, or pelvis. 2. Colonic diverticulosis, without radiographic evidence of diverticulitis.   Aortic Atherosclerosis (ICD10-I70.0).   Endometrial adenocarcinoma ([place])  05/07/2017 Initial Diagnosis   Endometrial adenocarcinoma ([place])   06/09/2017 Surgery   XI ROBOTIC ASSISTED TOTAL LAPOROSCOPIC HYSTERECTOMY WITH BILATERAL SALPINGO OOPHORECTOMY and SENTINEL NODE BIOPSY by Dr. Denman George 06/09/17   06/09/2017 Pathology Results   Diagnosis 06/09/17 1. Lymph node, sentinel, biopsy, right external iliac - METASTATIC ADENOCARCINOMA IN ONE LYMPH NODE (1/1). 2. Lymph node, sentinel, biopsy, left obturator - ONE BENIGN LYMPH NODE (0/1). 3. Lymph node, sentinel, biopsy, left external iliac - METASTATIC ADENOCARCINOMA IN ONE LYMPH NODE (1/1). 4. Uterus +/- tubes/ovaries, neoplastic ENDOMETRIUM: - ENDOMETRIAL ADENOCARCINOMA, 3.2 CM. - CARCINOMA INVADES INNER HALF OF MYOMETRIUM. 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 - CERVIX, BILATERAL FALLOPIAN TUBES AND BILATERAL OVARIES FREE OF TUMOR   08/18/2017 - 09/17/2017 Radiation Therapy   vaginal brachytherapy per 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   02/17/2018 -  Chemotherapy   Keytruda every 3 weeks starting 02/17/18   04/11/2019 Imaging   CT AP W Contrast  IMPRESSION: 1. No evidence of recurrent or metastatic carcinoma within the abdomen or pelvis. 2. Colonic diverticulosis. No radiographic evidence of diverticulitis.        CURRENT THERAPY:  -Keytruda every 3 weeksstarting 02/17/18. -Letrozole 2.'5mg'$  daily starting 12/23/17. Switched to Anastrozole in 04/2018 due to joint pain  INTERVAL HISTORY:  Dawn [name] is here for a follow up and treatment. She presents to the clinic alone. She notes she  has a skin rash on her abdomen and has been present for the past 2-3 weeks. She is not sure what is causing it. She denies changing anything about her skin. She notes she is currently on a low to no carb diet and she is working to lose weight.   She notes she has gotten her COVID19 vaccination. She mainly had arm pain from it. She notes she still works part time in [employer].     REVIEW OF SYSTEMS:   Constitutional: Denies fevers, chills or abnormal weight loss Eyes: Denies blurriness of vision Ears, nose, mouth, throat, and face: Denies mucositis or sore throat Respiratory: Denies cough, dyspnea or wheezes Cardiovascular: Denies palpitation, chest discomfort or lower extremity swelling Gastrointestinal:  Denies nausea, heartburn or change in bowel habits Skin: Denies abnormal skin rashes Lymphatics: Denies new lymphadenopathy or easy bruising Neurological:Denies numbness, tingling or new weaknesses Behavioral/Psych: Mood is stable, no new changes  All other systems were reviewed with the patient and are negative.  MEDICAL HISTORY:  Past Medical History:  Diagnosis Date  . Anemia    as a child.  . Arthritis   . Bilateral cataracts   . Bilateral leg cramps   . Cancer ([place])    skin  . Dyspnea   . Endometrial cancer ([place])   . Family history of bladder cancer   . Family history of colon cancer in father   . Fatty liver   . Fuchs' corneal dystrophy   . GERD (gastroesophageal reflux disease)   . Hearing loss    Right side 30%  .  Heartburn   . History of hiatal hernia    small size  . History of radiation therapy 08/21/17, 08/28/17,09/01/17, 09/11/17, 09/17/17   Vaginal cuff brachytherapy.   Marland Kitchen History of radiation therapy 11/11/17- 12/08/17   Right Breast treated to 40.05 Gy with 15 fx of 2.67 Gy and a boost of 10 Gy with 5 fx.   . Hyperlipidemia   . Hypertension   . Hypothyroidism   . IBS (irritable bowel syndrome)    hx of  . Malignant neoplasm of upper-inner quadrant of right  female breast ([place]) 04/2017  . NAFL (nonalcoholic fatty liver)   . Obesity   . Osteoarthritis   . Osteopenia   . PONV (postoperative nausea and vomiting)   . 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 Uterine cancer ([place]) 04/2017   endometrial cancer  . Varices, gastric   . Vertigo     SURGICAL HISTORY: Past Surgical History:  Procedure Laterality Date  . BREAST BIOPSY Right 04/27/2017  . BREAST LUMPECTOMY WITH RADIOACTIVE SEED AND SENTINEL LYMPH NODE BIOPSY Right 05/25/2017   Procedure: RIGHT BREAST LUMPECTOMY WITH RADIOACTIVE SEED AND  RIGHT AXILLARY SENTINEL LYMPH NODE BIOPSY;  Surgeon: Excell Seltzer, MD;  Location: [place];  Service: General;  Laterality: Right;  . CATARACT EXTRACTION, BILATERAL    . COLONOSCOPY    . HYSTEROSCOPY WITH D & C N/A 04/29/2017   Procedure: DILATATION AND CURETTAGE /HYSTEROSCOPY;  Surgeon: Linda Hedges, DO;  Location: [facility];  Service: Gynecology;  Laterality: N/A;  . PORTACATH PLACEMENT Left 05/25/2017   Procedure: INSERTION PORT-A-CATH;  Surgeon: Excell Seltzer, MD;  Location: [place];  Service: General;  Laterality: Left;  . ROBOTIC ASSISTED TOTAL HYSTERECTOMY WITH BILATERAL SALPINGO OOPHERECTOMY N/A 06/09/2017   Procedure: XI ROBOTIC ASSISTED TOTAL LAPOROSCOPIC HYSTERECTOMY WITH BILATERAL SALPINGO OOPHORECTOMY;  Surgeon: Everitt Amber, MD;  Location: [facility];  Service: Gynecology;  Laterality: N/A;  . SENTINEL NODE BIOPSY N/A 06/09/2017   Procedure: SENTINEL NODE BIOPSY;  Surgeon: Everitt Amber, MD;  Location: [facility];  Service: Gynecology;  Laterality: N/A;    I have reviewed the social history and family history with the patient and they are unchanged from previous note.  ALLERGIES:  is allergic to ciprofloxacin; paclitaxel; crestor [rosuvastatin calcium]; fosamax [alendronate sodium]; and penicillins.  MEDICATIONS:  Current Outpatient Medications  Medication Sig Dispense Refill    . acetaminophen (TYLENOL) 500 MG tablet Take 500 mg by mouth every 6 (six) hours as needed for moderate pain.    Marland Kitchen anastrozole (ARIMIDEX) 1 MG tablet Take 1 tablet (1 mg total) by mouth daily. 90 tablet 3  . Ascorbic Acid (VITAMIN C) 1000 MG tablet Take 1,000 mg by mouth daily.    Marland Kitchen b complex vitamins capsule Take 1 capsule by mouth daily.    . diphenhydrAMINE (BENADRYL) 25 MG tablet Take 25-50 mg by mouth every 6 (six) hours as needed for sleep.    . fluticasone (FLONASE) 50 MCG/ACT nasal spray Place 2 sprays into both nostrils daily.    Marland Kitchen ibandronate (BONIVA) 150 MG tablet Take 1 tablet (150 mg total) by mouth every 30 (thirty) days. 3 tablet 3  . levothyroxine (SYNTHROID) 50 MCG tablet TAKE 1 TABLET BY MOUTH  DAILY 90 tablet 3  . lidocaine-prilocaine (EMLA) cream APPLY TO AFFECTED AREA ONCE 30 g 2  . Multiple Vitamin (MULTIVITAMIN) capsule Take 1 capsule by mouth daily.      . Na Sulfate-K Sulfate-Mg Sulf (  SUPREP BOWEL PREP KIT) 17.5-3.13-1.6 GM/177ML SOLN Take 1 kit by mouth as directed. 324 mL 0  . olmesartan-hydrochlorothiazide (BENICAR HCT) 20-12.5 MG tablet Take 1 tablet by mouth daily. 90 tablet 2  . omeprazole (PRILOSEC) 20 MG capsule Take 1 capsule shortly before dinner meal each day 30 capsule 0  . potassium chloride SA (KLOR-CON) 20 MEQ tablet Take 20 mEq by mouth once.    . sodium chloride (MURO 128) 5 % ophthalmic solution Place 2 drops into both eyes daily.    Marland Kitchen triamcinolone cream (KENALOG) 0.1 % APPLY TO ITCHY SPOTS ONCE DAILY  1  . Vitamin D, Ergocalciferol, (DRISDOL) 1.25 MG (50000 UNIT) CAPS capsule Take 1 capsule (50,000 Units total) by mouth every 7 (seven) days. 4 capsule 0   No current facility-administered medications for this visit.   Facility-Administered Medications Ordered in Other Visits  Medication Dose Route Frequency Provider Last Rate Last Admin  . heparin lock flush 100 unit/mL  500 Units Intracatheter Once PRN Truitt Merle, MD      . pembrolizumab [facility])  200 mg in sodium chloride 0.9 % 50 mL chemo infusion  200 mg Intravenous Once Truitt Merle, MD 116 mL/hr at 10/26/19 1143 200 mg at 10/26/19 1143  . sodium chloride flush (NS) 0.9 % injection 10 mL  10 mL Intracatheter PRN Truitt Merle, MD   10 mL at 08/25/18 1111  . sodium chloride flush (NS) 0.9 % injection 10 mL  10 mL Intracatheter PRN Truitt Merle, MD        PHYSICAL EXAMINATION: ECOG PERFORMANCE STATUS: 0 - Asymptomatic  Vitals:   10/26/19 0942  BP: 129/70  Pulse: 73  Resp: 18  Temp: 97.8 F (36.6 C)  SpO2: 98%   Filed Weights   10/26/19 0942  Weight: 190 lb 9.6 oz (86.5 kg)    GENERAL:alert, no distress and comfortable  SKIN: skin color, texture, turgor are normal or significant lesions (+) Mild diffuse skin erythema of abdomen from scratching and a few spots of irritation.  EYES: normal, Conjunctiva are pink and non-injected, sclera clear  NECK: supple, thyroid normal size, non-tender, without nodularity LYMPH:  no palpable lymphadenopathy in the cervical, axillary  LUNGS: clear to auscultation and percussion with normal breathing effort HEART: regular rate & rhythm and no murmurs and no lower extremity edema ABDOMEN:abdomen soft, non-tender and normal bowel sounds Musculoskeletal:no cyanosis of digits and no clubbing  NEURO: alert & oriented x 3 with fluent speech, no focal motor/sensory deficits BREAST: S/p right lumpectomy: Surgical incision healed well. (+)palpable 1x1.5cm lumpiness at 1:00 position, 6 cm from nipple, stable.   LABORATORY DATA:  I have reviewed the data as listed CBC Latest Ref Rng & Units 10/24/2019 10/05/2019 09/14/2019  WBC 4.0 - 10.5 K/uL 4.3 3.8(L) 4.3  Hemoglobin 12.0 - 15.0 g/dL 14.3 13.7 13.6  Hematocrit 36.0 - 46.0 % 40.0 38.8 39.2  Platelets 150 - 400 K/uL 181 156 143(L)     CMP Latest Ref Rng & Units 10/24/2019 10/05/2019 09/14/2019  Glucose 70 - 99 mg/dL 114(H) 142(H) 140(H)  BUN 8 - 23 mg/dL '15 14 11  '$ Creatinine 0.44 - 1.00 mg/dL 0.83 0.79  0.77  Sodium 135 - 145 mmol/L 139 138 139  Potassium 3.5 - 5.1 mmol/L 3.8 3.7 3.6  Chloride 98 - 111 mmol/L 102 103 101  CO2 22 - 32 mmol/L '28 26 26  '$ Calcium 8.9 - 10.3 mg/dL 9.2 9.0 8.9  Total Protein 6.5 - 8.1 g/dL 7.6 7.2 7.1  Total  Bilirubin 0.3 - 1.2 mg/dL 0.7 0.7 0.7  Alkaline Phos 38 - 126 U/L 51 42 43  AST 15 - 41 U/L 53(H) 81(H) 56(H)  ALT 0 - 44 U/L 132(H) 115(H) 91(H)      RADIOGRAPHIC STUDIES: I have personally reviewed the radiological images as listed and agreed with the findings in the report. No results found.   ASSESSMENT & PLAN:  MARIEME MCMACKIN is a 69 y.o. female with   1. 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 Treated with right breast lumpectomy, adjuvant chemo and radiation.She tried Nerlynx but could not tolerate due to diarrhea,and does not want try again. -She started anti-estrogen therapy with letrozole. Due to joint pain I switched her to Anastrozole in 04/2018. -She notes joint pain, hot flashes and her weight continues to trend up from anastrozole, but overall tolerable currently.  -I personally reviewed and discussed her CT CAP from 11/13/19 which shows no evidence of recurrent or metastatic carcinoma.  -She is clinically doing well. Lab from this week reviewed, her CBC and CMP are within normal limits except ANC 1.5, BG 114, AST 53, ALT 132. Her physical exam and her 04/2019 mammogram were unremarkable. There is no clinical concern for recurrence. -Continue anastrozole -F/u 9 weeks  -She has received both her COVID19 vaccinations.   2. Endometrial adenocarcinomawith lymph node metastasis, pT1apN1M0, FIGO stage IIIc, MSI-H, probable RP node recurrence in 12/2017 -Diagnosed in 04/2017. Treated with hysterectomy, BSO, adjuvant radiation, and chemo with Carbotaxol. Currently on Keytruda every 3 weeksfor presumed retroperitoneal lymph node metastasis since  02/17/18. Tolerating well. She will continue to complete 2 years treatment.If noevidence of progression, plan to complete in 02/2020. -Her CT CAP from 10/26/19 shows NED for endometrial cancer.  -For continued colon cancer screening is fine to proceed with endoscopy/colonoscopy. I recommend she see [facility]. She is agreeable.  -Labs reviewed and adequate to proceed with [facility] today. She continues to tolerate well. Continue every 3 weeks and plan to complete in late May 2021.  -Will repeat scan in June 2021.  -She has been having skin itching of abdomen, likely related to dry skin and less likely rash. She can use lotion and OTC hydrocortisone cream. If this does not improve she should see dermatologist.    3. HTN, Acid reflux, High Cholesterol  -WellControlled, Continue tof/u with PCP -She has had acid reflux causing upper back pain lately. I encouraged her to take antiacid which is fine to take at night given her levothyroxine.   4. Transaminitis -Secondary to fatty liver disease -Stable   5.Osteopenia -DEXA scan in 05/2018 revealed T score of -2.0. -She is on monthly boniva, prescribed by her PCP, since 02/2018.Continue -ContinueVitamin D and Calcium supplements.Will check her Vitamin D level every 6 months.   6.Metabolic syndrome, hyperglycemia   -She has no history of diabetes. Her random blood glucose was 213, we discussed diabetic diet and exercise -She was recently found to have abnormal TSH levels. She has been seen by endocrinologist and has startedlevothyroxineon 01/04/19. Will monitor TSH level every 3 weeks.  -Her weight does continue to trend up. I encouraged her to remainactive. -Continue to follow-up with her primary care physician  7. Hypokalemia -On KCL 68mq daily, K normal at 3.8 today   8. Weight Gain -She is interested in weight loss -I previously referred her to [facility] for weight  loss.  -She is currently on no carb diet.    Plan -CT CAP reviewed, NED. She is clinically doing well  -Keytruda in 3, 6, 9 weeks  -Lab in 6 weeks  -F/u in 9 weeks   -Continue anastrozole    No problem-specific Assessment & Plan notes found for this encounter.   No orders of the defined types were placed in this encounter.  All questions were answered. The patient knows to call the clinic with any problems, questions or concerns. No barriers to learning was detected. The total time spent in the appointment was 30 minutes.     Truitt Merle, MD 10/26/2019   I, Joslyn Devon, am acting as scribe for Truitt Merle, MD.   I have reviewed the above documentation for accuracy and completeness, and I agree with the above.

## 2019-10-24 ENCOUNTER — Other Ambulatory Visit: Payer: Self-pay

## 2019-10-24 ENCOUNTER — Ambulatory Visit (HOSPITAL_COMMUNITY)
Admission: RE | Admit: 2019-10-24 | Discharge: 2019-10-24 | Disposition: A | Discharge status: Home / Self Care | Payer: Medicare Other | Source: Ambulatory Visit | Attending: Hematology | Admitting: Hematology

## 2019-10-24 ENCOUNTER — Inpatient Hospital Stay: Payer: Medicare Other | Attending: Hematology

## 2019-10-24 ENCOUNTER — Inpatient Hospital Stay: Payer: Medicare Other

## 2019-10-24 DIAGNOSIS — N951 Menopausal and female climacteric states: Secondary | ICD-10-CM | POA: Diagnosis not present

## 2019-10-24 DIAGNOSIS — Z170 Estrogen receptor positive status [ER+]: Secondary | ICD-10-CM | POA: Diagnosis not present

## 2019-10-24 DIAGNOSIS — R232 Flushing: Secondary | ICD-10-CM | POA: Diagnosis not present

## 2019-10-24 DIAGNOSIS — Z79811 Long term (current) use of aromatase inhibitors: Secondary | ICD-10-CM | POA: Diagnosis not present

## 2019-10-24 DIAGNOSIS — K219 Gastro-esophageal reflux disease without esophagitis: Secondary | ICD-10-CM | POA: Diagnosis not present

## 2019-10-24 DIAGNOSIS — R635 Abnormal weight gain: Secondary | ICD-10-CM | POA: Diagnosis not present

## 2019-10-24 DIAGNOSIS — M8580 Other specified disorders of bone density and structure, unspecified site: Secondary | ICD-10-CM | POA: Insufficient documentation

## 2019-10-24 DIAGNOSIS — Z79899 Other long term (current) drug therapy: Secondary | ICD-10-CM | POA: Insufficient documentation

## 2019-10-24 DIAGNOSIS — C541 Malignant neoplasm of endometrium: Secondary | ICD-10-CM

## 2019-10-24 DIAGNOSIS — I10 Essential (primary) hypertension: Secondary | ICD-10-CM | POA: Diagnosis not present

## 2019-10-24 DIAGNOSIS — M1990 Unspecified osteoarthritis, unspecified site: Secondary | ICD-10-CM | POA: Diagnosis not present

## 2019-10-24 DIAGNOSIS — K760 Fatty (change of) liver, not elsewhere classified: Secondary | ICD-10-CM | POA: Insufficient documentation

## 2019-10-24 DIAGNOSIS — E8881 Metabolic syndrome: Secondary | ICD-10-CM | POA: Insufficient documentation

## 2019-10-24 DIAGNOSIS — E785 Hyperlipidemia, unspecified: Secondary | ICD-10-CM | POA: Insufficient documentation

## 2019-10-24 DIAGNOSIS — Z95828 Presence of other vascular implants and grafts: Secondary | ICD-10-CM

## 2019-10-24 DIAGNOSIS — E876 Hypokalemia: Secondary | ICD-10-CM | POA: Diagnosis not present

## 2019-10-24 DIAGNOSIS — Z9221 Personal history of antineoplastic chemotherapy: Secondary | ICD-10-CM | POA: Diagnosis not present

## 2019-10-24 DIAGNOSIS — Z5112 Encounter for antineoplastic immunotherapy: Secondary | ICD-10-CM | POA: Diagnosis not present

## 2019-10-24 DIAGNOSIS — Z923 Personal history of irradiation: Secondary | ICD-10-CM | POA: Diagnosis not present

## 2019-10-24 DIAGNOSIS — C50211 Malignant neoplasm of upper-inner quadrant of right female breast: Secondary | ICD-10-CM | POA: Insufficient documentation

## 2019-10-24 DIAGNOSIS — E7800 Pure hypercholesterolemia, unspecified: Secondary | ICD-10-CM | POA: Diagnosis not present

## 2019-10-24 DIAGNOSIS — L270 Generalized skin eruption due to drugs and medicaments taken internally: Secondary | ICD-10-CM | POA: Insufficient documentation

## 2019-10-24 DIAGNOSIS — R946 Abnormal results of thyroid function studies: Secondary | ICD-10-CM | POA: Insufficient documentation

## 2019-10-24 LAB — COMPREHENSIVE METABOLIC PANEL
ALT: 132 U/L — ABNORMAL HIGH (ref 0–44)
AST: 53 U/L — ABNORMAL HIGH (ref 15–41)
Albumin: 4.2 g/dL (ref 3.5–5.0)
Alkaline Phosphatase: 51 U/L (ref 38–126)
Anion gap: 9 (ref 5–15)
BUN: 15 mg/dL (ref 8–23)
CO2: 28 mmol/L (ref 22–32)
Calcium: 9.2 mg/dL (ref 8.9–10.3)
Chloride: 102 mmol/L (ref 98–111)
Creatinine, Ser: 0.83 mg/dL (ref 0.44–1.00)
GFR calc Af Amer: >60 mL/min (ref >60)
GFR calc non Af Amer: >60 mL/min (ref >60)
Glucose, Bld: 114 mg/dL — ABNORMAL HIGH (ref 70–99)
Potassium: 3.8 mmol/L (ref 3.5–5.1)
Sodium: 139 mmol/L (ref 135–145)
Total Bilirubin: 0.7 mg/dL (ref 0.3–1.2)
Total Protein: 7.6 g/dL (ref 6.5–8.1)

## 2019-10-24 LAB — CBC WITH DIFFERENTIAL/PLATELET
Abs Immature Granulocytes: 0 10*3/uL (ref 0.00–0.07)
Basophils Absolute: 0 10*3/uL (ref 0.0–0.1)
Basophils Relative: 1 %
Eosinophils Absolute: 0.2 10*3/uL (ref 0.0–0.5)
Eosinophils Relative: 5 %
HCT: 40 % (ref 36.0–46.0)
Hemoglobin: 14.3 g/dL (ref 12.0–15.0)
Immature Granulocytes: 0 %
Lymphocytes Relative: 49 %
Lymphs Abs: 2.1 10*3/uL (ref 0.7–4.0)
MCH: 35.3 pg — ABNORMAL HIGH (ref 26.0–34.0)
MCHC: 35.8 g/dL (ref 30.0–36.0)
MCV: 98.8 fL (ref 80.0–100.0)
Monocytes Absolute: 0.4 10*3/uL (ref 0.1–1.0)
Monocytes Relative: 10 %
Neutro Abs: 1.5 10*3/uL — ABNORMAL LOW (ref 1.7–7.7)
Neutrophils Relative %: 35 %
Platelets: 181 10*3/uL (ref 150–400)
RBC: 4.05 MIL/uL (ref 3.87–5.11)
RDW: 11.9 % (ref 11.5–15.5)
WBC: 4.3 10*3/uL (ref 4.0–10.5)
nRBC: 0 % (ref 0.0–0.2)

## 2019-10-24 LAB — TSH: TSH: 1.851 u[IU]/mL (ref 0.308–3.960)

## 2019-10-24 MED ORDER — SODIUM CHLORIDE (PF) 0.9 % IJ SOLN
INTRAMUSCULAR | Status: AC
  Filled 2019-10-24: qty 50

## 2019-10-24 MED ORDER — IOHEXOL 300 MG/ML  SOLN
100.0000 mL | Freq: Once | INTRAMUSCULAR | Status: AC | PRN
  Administered 2019-10-24: 11:00:00 100 mL via INTRAVENOUS

## 2019-10-24 MED ORDER — HEPARIN SOD (PORK) LOCK FLUSH 100 UNIT/ML IV SOLN
INTRAVENOUS | Status: AC
  Filled 2019-10-24: qty 5

## 2019-10-24 MED ORDER — SODIUM CHLORIDE 0.9% FLUSH
10.0000 mL | Freq: Once | INTRAVENOUS | Status: AC
  Administered 2019-10-24: 10 mL
  Filled 2019-10-24: qty 10

## 2019-10-24 MED ORDER — HEPARIN SOD (PORK) LOCK FLUSH 100 UNIT/ML IV SOLN
500.0000 [IU] | Freq: Once | INTRAVENOUS | Status: AC
  Administered 2019-10-24: 500 [IU]

## 2019-10-26 ENCOUNTER — Inpatient Hospital Stay: Payer: Medicare Other

## 2019-10-26 ENCOUNTER — Other Ambulatory Visit: Payer: Self-pay

## 2019-10-26 ENCOUNTER — Inpatient Hospital Stay (HOSPITAL_BASED_OUTPATIENT_CLINIC_OR_DEPARTMENT_OTHER): Payer: Medicare Other | Admitting: Hematology

## 2019-10-26 ENCOUNTER — Encounter: Payer: Self-pay | Admitting: Hematology

## 2019-10-26 VITALS — BP 129/70 | HR 73 | Temp 97.8°F | Resp 18 | Ht 63.0 in | Wt 190.6 lb

## 2019-10-26 DIAGNOSIS — R7401 Elevation of levels of liver transaminase levels: Secondary | ICD-10-CM | POA: Diagnosis not present

## 2019-10-26 DIAGNOSIS — C541 Malignant neoplasm of endometrium: Secondary | ICD-10-CM

## 2019-10-26 DIAGNOSIS — C50211 Malignant neoplasm of upper-inner quadrant of right female breast: Secondary | ICD-10-CM | POA: Diagnosis not present

## 2019-10-26 DIAGNOSIS — Z170 Estrogen receptor positive status [ER+]: Secondary | ICD-10-CM | POA: Diagnosis not present

## 2019-10-26 DIAGNOSIS — M810 Age-related osteoporosis without current pathological fracture: Secondary | ICD-10-CM

## 2019-10-26 DIAGNOSIS — M8580 Other specified disorders of bone density and structure, unspecified site: Secondary | ICD-10-CM | POA: Diagnosis not present

## 2019-10-26 DIAGNOSIS — Z5112 Encounter for antineoplastic immunotherapy: Secondary | ICD-10-CM | POA: Diagnosis not present

## 2019-10-26 DIAGNOSIS — Z79811 Long term (current) use of aromatase inhibitors: Secondary | ICD-10-CM | POA: Diagnosis not present

## 2019-10-26 MED ORDER — SODIUM CHLORIDE 0.9 % IV SOLN
200.0000 mg | Freq: Once | INTRAVENOUS | Status: AC
  Administered 2019-10-26: 200 mg via INTRAVENOUS
  Filled 2019-10-26: qty 8

## 2019-10-26 MED ORDER — SODIUM CHLORIDE 0.9 % IV SOLN
Freq: Once | INTRAVENOUS | Status: AC
  Filled 2019-10-26: qty 250

## 2019-10-26 MED ORDER — SODIUM CHLORIDE 0.9% FLUSH
10.0000 mL | INTRAVENOUS | Status: DC | PRN
  Administered 2019-10-26: 10 mL
  Filled 2019-10-26: qty 10

## 2019-10-26 MED ORDER — HEPARIN SOD (PORK) LOCK FLUSH 100 UNIT/ML IV SOLN
500.0000 [IU] | Freq: Once | INTRAVENOUS | Status: AC | PRN
  Administered 2019-10-26: 500 [IU]
  Filled 2019-10-26: qty 5

## 2019-10-26 NOTE — Patient Instructions (Signed)
[facility] Discharge Instructions for Patients Receiving Chemotherapy  Today you received the following chemotherapy agents:  Keytruda.  To help prevent nausea and vomiting after your treatment, we encourage you to take your nausea medication as directed.   If you develop nausea and vomiting that is not controlled by your nausea medication, call the clinic.   BELOW ARE SYMPTOMS THAT SHOULD BE REPORTED IMMEDIATELY:  *FEVER GREATER THAN 100.5 F  *CHILLS WITH OR WITHOUT FEVER  NAUSEA AND VOMITING THAT IS NOT CONTROLLED WITH YOUR NAUSEA MEDICATION  *UNUSUAL SHORTNESS OF BREATH  *UNUSUAL BRUISING OR BLEEDING  TENDERNESS IN MOUTH AND THROAT WITH OR WITHOUT PRESENCE OF ULCERS  *URINARY PROBLEMS  *BOWEL PROBLEMS  UNUSUAL RASH Items with * indicate a potential emergency and should be followed up as soon as possible.  Feel free to call the clinic should you have any questions or concerns. The clinic phone number is [phone].  Please show the CHEMO ALERT CARD at check-in to the Emergency Department and triage nurse.    

## 2019-10-26 NOTE — Progress Notes (Signed)
Per [name], [facility] to treat with ALT 132 and AST 53

## 2019-10-27 ENCOUNTER — Telehealth: Payer: Self-pay | Admitting: Hematology

## 2019-10-27 NOTE — Telephone Encounter (Signed)
Scheduled appt per 2/10 los.  Spoke with pt and she is aware of he scheduled appt dates and time.

## 2019-11-02 ENCOUNTER — Ambulatory Visit (INDEPENDENT_AMBULATORY_CARE_PROVIDER_SITE_OTHER): Payer: Medicare Other | Admitting: Family Medicine

## 2019-11-02 ENCOUNTER — Other Ambulatory Visit: Payer: Self-pay

## 2019-11-02 ENCOUNTER — Encounter (INDEPENDENT_AMBULATORY_CARE_PROVIDER_SITE_OTHER): Payer: Self-pay | Admitting: Family Medicine

## 2019-11-02 VITALS — BP 122/79 | HR 83 | Temp 97.6°F | Ht 63.0 in | Wt 186.0 lb

## 2019-11-02 DIAGNOSIS — E669 Obesity, unspecified: Secondary | ICD-10-CM

## 2019-11-02 DIAGNOSIS — Z6832 Body mass index (BMI) 32.0-32.9, adult: Secondary | ICD-10-CM | POA: Diagnosis not present

## 2019-11-02 DIAGNOSIS — E559 Vitamin D deficiency, unspecified: Secondary | ICD-10-CM

## 2019-11-02 DIAGNOSIS — R7989 Other specified abnormal findings of blood chemistry: Secondary | ICD-10-CM

## 2019-11-07 ENCOUNTER — Other Ambulatory Visit: Payer: Self-pay | Admitting: Hematology

## 2019-11-07 ENCOUNTER — Other Ambulatory Visit: Payer: Self-pay | Admitting: Family Medicine

## 2019-11-07 DIAGNOSIS — H04123 Dry eye syndrome of bilateral lacrimal glands: Secondary | ICD-10-CM | POA: Diagnosis not present

## 2019-11-07 DIAGNOSIS — H18513 Endothelial corneal dystrophy, bilateral: Secondary | ICD-10-CM | POA: Diagnosis not present

## 2019-11-07 DIAGNOSIS — H43813 Vitreous degeneration, bilateral: Secondary | ICD-10-CM | POA: Diagnosis not present

## 2019-11-07 DIAGNOSIS — Z170 Estrogen receptor positive status [ER+]: Secondary | ICD-10-CM

## 2019-11-07 DIAGNOSIS — I10 Essential (primary) hypertension: Secondary | ICD-10-CM

## 2019-11-07 DIAGNOSIS — C50211 Malignant neoplasm of upper-inner quadrant of right female breast: Secondary | ICD-10-CM

## 2019-11-07 DIAGNOSIS — H524 Presbyopia: Secondary | ICD-10-CM | POA: Diagnosis not present
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
## 2019-11-10 MED ORDER — VITAMIN D (ERGOCALCIFEROL) 1.25 MG (50000 UNIT) PO CAPS: 50000.0000 [IU] | ORAL_CAPSULE | ORAL | 0 refills | Status: DC

## 2019-11-14 ENCOUNTER — Other Ambulatory Visit: Payer: Self-pay

## 2019-11-14 ENCOUNTER — Ambulatory Visit (INDEPENDENT_AMBULATORY_CARE_PROVIDER_SITE_OTHER): Payer: Medicare Other | Admitting: Physician Assistant

## 2019-11-14 ENCOUNTER — Encounter (INDEPENDENT_AMBULATORY_CARE_PROVIDER_SITE_OTHER): Payer: Self-pay | Admitting: Physician Assistant

## 2019-11-14 VITALS — BP 119/77 | HR 71 | Temp 97.5°F | Ht 63.0 in | Wt 183.0 lb

## 2019-11-14 DIAGNOSIS — E669 Obesity, unspecified: Secondary | ICD-10-CM | POA: Diagnosis not present

## 2019-11-14 DIAGNOSIS — Z6832 Body mass index (BMI) 32.0-32.9, adult: Secondary | ICD-10-CM

## 2019-11-14 DIAGNOSIS — E7849 Other hyperlipidemia: Secondary | ICD-10-CM

## 2019-11-14 DIAGNOSIS — E559 Vitamin D deficiency, unspecified: Secondary | ICD-10-CM

## 2019-11-14 MED ORDER — VITAMIN D (ERGOCALCIFEROL) 1.25 MG (50000 UNIT) PO CAPS: 50000.0000 [IU] | ORAL_CAPSULE | ORAL | 0 refills | Status: DC

## 2019-11-14 NOTE — Progress Notes (Signed)
Chief Complaint:   OBESITY Dawn [name] is here to discuss her progress with her obesity treatment plan along with follow-up of her obesity related diagnoses. Dawn [name] is on the Category 2 Plan and states she is following her eating plan approximately 80-85% of the time. Dawn [name] states she is walking 45 minutes 5 times per week.  Today's visit was #: 4 Starting weight: 190 lbs Starting date: 10/03/2019 Today's weight: 183 lbs Today's date: 11/14/2019 Total lbs lost to date: 7 Total lbs lost since last in-office visit: 3  Interim History: Dawn [name] states that she does better with her eating when she is exercising. She has a sweet tooth and is looking for snack options as well as extra breakfast options.  Subjective:   Vitamin D deficiency. Dawn [name] is on prescription Vitamin D weekly. Last level was not at goal - 31.5 on 10/03/2019.  Other hyperlipidemia. Dawn [name] is on no medications. No chest pain. She is exercising regularly.   Lab Results  Component Value Date   CHOL 236 (H) 10/03/2019   HDL 54 10/03/2019   LDLCALC 157 (H) 10/03/2019   TRIG 137 10/03/2019   CHOLHDL 5.0 02/09/2019   Lab Results  Component Value Date   ALT 132 (H) 10/24/2019   AST 53 (H) 10/24/2019   ALKPHOS 51 10/24/2019   BILITOT 0.7 10/24/2019   The 10-year ASCVD risk score Dawn Bussing DC Jr., et al., 2013) is: 16%   Values used to calculate the score:     Age: 69 years     Sex: Female     Is Non-Hispanic African American: No     Diabetic: Yes     Tobacco smoker: No     Systolic Blood Pressure: 123456 mmHg     Is BP treated: Yes     HDL Cholesterol: 54 mg/dL     Total Cholesterol: 236 mg/dL  Assessment/Plan:   Vitamin D deficiency. Low Vitamin D level contributes to fatigue and are associated with obesity, breast, and colon cancer. She was given a refill on her Vitamin D, Ergocalciferol, (DRISDOL) 1.25 MG (50000 UNIT) CAPS capsule every week #4 with 0 refills and will follow-up for routine testing of  Vitamin D, at least 2-3 times per year to avoid over-replacement.    Other hyperlipidemia. Cardiovascular risk and specific lipid/LDL goals reviewed.  We discussed several lifestyle modifications today and Dawn [name] will continue to work on diet, exercise and weight loss efforts. Orders and follow up as documented in patient record.   Counseling Intensive lifestyle modifications are the first line treatment for this issue. . Dietary changes: Increase soluble fiber. Decrease simple carbohydrates. . Exercise changes: Moderate to vigorous-intensity aerobic activity 150 minutes per week if tolerated. . Lipid-lowering medications: see documented in medical record.  Class 1 obesity with serious comorbidity and body mass index (BMI) of 32.0 to 32.9 in adult, unspecified obesity type.  Dawn [name] is currently in the action stage of change. As such, her goal is to continue with weight loss efforts. She has agreed to the Category 2 Plan.   Exercise goals: Dawn [name] should follow the adult guidelines. When Dawn [name] cannot meet the adult guidelines, they should be as physically active as their abilities and conditions will allow.   Behavioral modification strategies: meal planning and cooking strategies and better snacking choices.  Dawn [name] has agreed to follow-up with our clinic in 2 weeks. She was informed of the importance of frequent follow-up visits to maximize her success with intensive lifestyle modifications  for her multiple health conditions.   Objective:   Blood pressure 119/77, pulse 71, temperature (!) 97.5 F (36.4 C), temperature source Oral, height 5\' 3"  (1.6 m), weight 183 lb (83 kg), SpO2 95 %. Body mass index is 32.42 kg/m.  General: Cooperative, alert, well developed, in no acute distress. HEENT: Conjunctivae and lids unremarkable. Cardiovascular: Regular rhythm.  Lungs: Normal work of breathing. Neurologic: No focal deficits.   Lab Results  Component Value Date   CREATININE  0.83 10/24/2019   BUN 15 10/24/2019   NA 139 10/24/2019   K 3.8 10/24/2019   CL 102 10/24/2019   CO2 28 10/24/2019   Lab Results  Component Value Date   ALT 132 (H) 10/24/2019   AST 53 (H) 10/24/2019   ALKPHOS 51 10/24/2019   BILITOT 0.7 10/24/2019   Lab Results  Component Value Date   HGBA1C 5.4 10/03/2019   HGBA1C 5.4 02/09/2019   HGBA1C 5.5 06/07/2018   HGBA1C 5.2 11/19/2017   HGBA1C 5.2 06/04/2017   Lab Results  Component Value Date   INSULIN 24.0 10/03/2019   Lab Results  Component Value Date   TSH 1.851 10/24/2019   Lab Results  Component Value Date   CHOL 236 (H) 10/03/2019   HDL 54 10/03/2019   LDLCALC 157 (H) 10/03/2019   TRIG 137 10/03/2019   CHOLHDL 5.0 02/09/2019   Lab Results  Component Value Date   WBC 4.3 10/24/2019   HGB 14.3 10/24/2019   HCT 40.0 10/24/2019   MCV 98.8 10/24/2019   PLT 181 10/24/2019   Lab Results  Component Value Date   FERRITIN 176 12/17/2012   Obesity Behavioral Intervention Documentation for Insurance:   Approximately 15 minutes were spent on the discussion below.  ASK: We discussed the diagnosis of obesity with Dawn [name] today and Dawn [name] agreed to give [name] permission to discuss obesity behavioral modification therapy today.  ASSESS: Dawn [name] has the diagnosis of obesity and her BMI today is 32.5. Dawn [name] is in the action stage of change.   ADVISE: Dawn [name] was educated on the multiple health risks of obesity as well as the benefit of weight loss to improve her health. She was advised of the need for long term treatment and the importance of lifestyle modifications to improve her current health and to decrease her risk of future health problems.  AGREE: Multiple dietary modification options and treatment options were discussed and Dawn [name] agreed to follow the recommendations documented in the above note.  ARRANGE: Dawn [name] was educated on the importance of frequent visits to treat obesity as outlined per CMS and USPSTF  guidelines and agreed to schedule her next follow up appointment today.  Attestation Statements:   Reviewed by clinician on day of visit: allergies, medications, problem list, medical history, surgical history, family history, social history, and previous encounter notes.  IMichaelene Song, am acting as transcriptionist for [name], PA-C   I have reviewed the above documentation for accuracy and completeness, and I agree with the above. [name], PA-C

## 2019-11-16 ENCOUNTER — Other Ambulatory Visit: Payer: Self-pay

## 2019-11-16 ENCOUNTER — Inpatient Hospital Stay: Payer: Medicare Other | Attending: Hematology

## 2019-11-16 VITALS — BP 125/83 | HR 76 | Temp 97.0°F | Resp 16

## 2019-11-16 DIAGNOSIS — C541 Malignant neoplasm of endometrium: Secondary | ICD-10-CM | POA: Diagnosis not present

## 2019-11-16 DIAGNOSIS — C50211 Malignant neoplasm of upper-inner quadrant of right female breast: Secondary | ICD-10-CM | POA: Insufficient documentation

## 2019-11-16 DIAGNOSIS — Z170 Estrogen receptor positive status [ER+]: Secondary | ICD-10-CM | POA: Insufficient documentation

## 2019-11-16 DIAGNOSIS — Z5112 Encounter for antineoplastic immunotherapy: Secondary | ICD-10-CM | POA: Diagnosis not present

## 2019-11-16 MED ORDER — SODIUM CHLORIDE 0.9 % IV SOLN
Freq: Once | INTRAVENOUS | Status: AC
  Filled 2019-11-16: qty 250

## 2019-11-16 MED ORDER — SODIUM CHLORIDE 0.9 % IV SOLN
200.0000 mg | Freq: Once | INTRAVENOUS | Status: AC
  Administered 2019-11-16: 200 mg via INTRAVENOUS
  Filled 2019-11-16: qty 8

## 2019-11-16 MED ORDER — SODIUM CHLORIDE 0.9% FLUSH
10.0000 mL | INTRAVENOUS | Status: DC | PRN
  Administered 2019-11-16: 10 mL
  Filled 2019-11-16: qty 10

## 2019-11-16 MED ORDER — HEPARIN SOD (PORK) LOCK FLUSH 100 UNIT/ML IV SOLN
500.0000 [IU] | Freq: Once | INTRAVENOUS | Status: AC | PRN
  Administered 2019-11-16: 500 [IU]
  Filled 2019-11-16: qty 5

## 2019-11-16 NOTE — Progress Notes (Signed)
Per [name], labs not needed for today's treatment.

## 2019-11-18 ENCOUNTER — Ambulatory Visit (INDEPENDENT_AMBULATORY_CARE_PROVIDER_SITE_OTHER): Payer: Medicare Other

## 2019-11-18 ENCOUNTER — Other Ambulatory Visit: Payer: Self-pay | Admitting: Gastroenterology

## 2019-11-18 DIAGNOSIS — Z1159 Encounter for screening for other viral diseases: Secondary | ICD-10-CM | POA: Diagnosis not present

## 2019-11-19 LAB — SARS CORONAVIRUS 2 (TAT 6-24 HRS): SARS Coronavirus 2: NEGATIVE

## 2019-11-22 ENCOUNTER — Other Ambulatory Visit: Payer: Self-pay

## 2019-11-22 ENCOUNTER — Encounter: Payer: Self-pay | Admitting: Gastroenterology

## 2019-11-22 ENCOUNTER — Ambulatory Visit (AMBULATORY_SURGERY_CENTER): Payer: Medicare Other | Admitting: Gastroenterology

## 2019-11-22 VITALS — BP 117/55 | HR 79 | Temp 97.1°F | Resp 11 | Ht 63.0 in | Wt 189.0 lb

## 2019-11-22 DIAGNOSIS — K219 Gastro-esophageal reflux disease without esophagitis: Secondary | ICD-10-CM

## 2019-11-22 DIAGNOSIS — K2970 Gastritis, unspecified, without bleeding: Secondary | ICD-10-CM | POA: Diagnosis not present

## 2019-11-22 DIAGNOSIS — Z800 Family history of malignant neoplasm of digestive organs: Secondary | ICD-10-CM | POA: Diagnosis not present

## 2019-11-22 DIAGNOSIS — Z86010 Personal history of colonic polyps: Secondary | ICD-10-CM | POA: Diagnosis not present

## 2019-11-22 DIAGNOSIS — I10 Essential (primary) hypertension: Secondary | ICD-10-CM | POA: Diagnosis not present

## 2019-11-22 DIAGNOSIS — K2950 Unspecified chronic gastritis without bleeding: Secondary | ICD-10-CM | POA: Diagnosis not present

## 2019-11-22 DIAGNOSIS — I864 Gastric varices: Secondary | ICD-10-CM | POA: Diagnosis not present

## 2019-11-22 DIAGNOSIS — Z1211 Encounter for screening for malignant neoplasm of colon: Secondary | ICD-10-CM | POA: Diagnosis not present

## 2019-11-22 DIAGNOSIS — D123 Benign neoplasm of transverse colon: Secondary | ICD-10-CM

## 2019-11-22 DIAGNOSIS — K209 Esophagitis, unspecified without bleeding: Secondary | ICD-10-CM

## 2019-11-22 DIAGNOSIS — K3189 Other diseases of stomach and duodenum: Secondary | ICD-10-CM | POA: Diagnosis not present

## 2019-11-22 MED ORDER — SODIUM CHLORIDE 0.9 % IV SOLN: 500.0000 mL | Freq: Once | INTRAVENOUS | Status: DC

## 2019-11-22 NOTE — Progress Notes (Signed)
Report given to PACU, vss 

## 2019-11-22 NOTE — Progress Notes (Signed)
Called to room to assist during endoscopic procedure.  Patient ID and intended procedure confirmed with present staff. Received instructions for my participation in the procedure from the performing physician.  

## 2019-11-22 NOTE — Patient Instructions (Signed)
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  If the procedure report does not answer your questions, please call your gastroenterologist to clarify.  If you requested that your care partner not be given the details of your procedure findings, then the procedure report has been included in a sealed envelope for you to review at your convenience later.  YOU SHOULD EXPECT: Some feelings of bloating in the abdomen. Passage of more gas than usual.  Walking can help get rid of the air that was put into your GI tract during the procedure and reduce the bloating. If you had a lower endoscopy (such as a colonoscopy or flexible sigmoidoscopy) you may notice spotting of blood in your stool or on the toilet paper. If you underwent a bowel prep for your procedure, you may not have a normal bowel movement for a few days.  Please Note:  You might notice some irritation and congestion in your nose or some drainage.  This is from the oxygen used during your procedure.  There is no need for concern and it should clear up in a day or so.  SYMPTOMS TO REPORT IMMEDIATELY:   Following lower endoscopy (colonoscopy or flexible sigmoidoscopy):  Excessive amounts of blood in the stool  Significant tenderness or worsening of abdominal pains  Swelling of the abdomen that is new, acute  Fever of 100F or higher   Following upper endoscopy (EGD)  Vomiting of blood or coffee ground material  New chest pain or pain under the shoulder  blades  Painful or persistently difficult swallowing  New shortness of breath  Fever of 100F or higher  Black, tarry-looking stools  For urgent or emergent issues, a gastroenterologist can be reached at any hour by calling [phone]. Do not use MyChart messaging for urgent concerns.    DIET:  We do recommend a small meal at first, but then you may proceed to your regular diet.  Drink plenty of fluids but you should avoid alcoholic beverages for 24 hours.  ACTIVITY:  You should plan to take it easy for the rest of today and you should NOT DRIVE or use heavy machinery until tomorrow (because of the sedation medicines used during the test).    FOLLOW UP: Our staff will call the number listed on your records 48-72 hours following your procedure to check on you and address any questions or concerns that you may have regarding the information given to you following your procedure. If we do not reach you, we will leave a message.  We will attempt to reach you two times.  During this call, we will ask if you have developed any symptoms of COVID 19. If you develop any symptoms (ie: fever, flu-like symptoms, shortness of breath, cough etc.) before then, please call [phone].  If you test positive for Covid 19 in the 2 weeks post procedure, please call and report this information to [name].    If any biopsies were taken you will be contacted by phone or  by letter within the next 1-3 weeks.  Please call us at [phone] if you have not heard about the biopsies in 3 weeks.    SIGNATURES/CONFIDENTIALITY: You and/or your care partner have signed paperwork which will be entered into your electronic medical record.  These signatures attest to the fact that that the information above on your After Visit Summary has been reviewed and is understood.  Full responsibility of the confidentiality of this discharge information lies with you and/or your care-partner.

## 2019-11-22 NOTE — Op Note (Signed)
[facility] Patient Name: Dawn [name] Procedure Date: 11/22/2019 8:58 AM MRN: [id number] Endoscopist: Milus Banister , MD Age: 69 Referring MD:  Date of Birth: May 30, 1951 Gender: Female Account #: [id number] Procedure:                Colonoscopy Indications:              Screening in patient at increased risk: Family                            history of 1st-degree relative with colorectal                            cancer before age 14 years; Father had colon cancer                            in his 66s Medicines:                Monitored Anesthesia Care Procedure:                Pre-Anesthesia Assessment:                           - Prior to the procedure, a History and Physical                            was performed, and patient medications and                            allergies were reviewed. The patient's tolerance of                            previous anesthesia was also reviewed. The risks                            and benefits of the procedure and the sedation                            options and risks were discussed with the patient.                            All questions were answered, and informed consent                            was obtained. Prior Anticoagulants: The patient has                            taken no previous anticoagulant or antiplatelet                            agents. ASA Grade Assessment: II - A patient with                            mild systemic disease. After reviewing the risks  and benefits, the patient was deemed in                            satisfactory condition to undergo the procedure.                           After obtaining informed consent, the colonoscope                            was passed under direct vision. Throughout the                            procedure, the patient's blood pressure, pulse, and                            oxygen saturations were monitored continuously. The                          Colonoscope was introduced through the anus and                            advanced to the the cecum, identified by                            appendiceal orifice and ileocecal valve. The                            colonoscopy was performed without difficulty. The                            patient tolerated the procedure well. The quality                            of the bowel preparation was good. The ileocecal                            valve, appendiceal orifice, and rectum were                            photographed. Scope In: 9:02:42 AM Scope Out: 9:14:05 AM Scope Withdrawal Time: 0 hours 8 minutes 1 second  Total Procedure Duration: 0 hours 11 minutes 23 seconds  Findings:                 Two sessile polyps were found in the transverse                            colon. The polyps were 2 to 4 mm in size. These                            polyps were removed with a cold snare. Resection                            and retrieval were complete.  Multiple small and large-mouthed diverticula were                            found in the left colon.                           The exam was otherwise without abnormality on                            direct and retroflexion views. Complications:            No immediate complications. Estimated blood loss:                            None. Estimated Blood Loss:     Estimated blood loss: none. Impression:               - Two 2 to 4 mm polyps in the transverse colon,                            removed with a cold snare. Resected and retrieved.                           - Diverticulosis in the left colon.                           - The examination was otherwise normal on direct                            and retroflexion views. Recommendation:           - EGD now.                           - Await pathology results. Milus Banister, MD 11/22/2019 9:16:33 AM This report has been signed  electronically.

## 2019-11-22 NOTE — Op Note (Signed)
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 The patient's tolerance of                            previous anesthesia was also reviewed. The risks                            and benefits of the procedure and the sedation                            options and risks were discussed with the patient.                            All questions were answered, and informed consent                            was obtained. Prior Anticoagulants: The patient has                            taken no previous anticoagulant or antiplatelet                            agents. ASA Grade Assessment: II - A patient with                            mild systemic disease. After reviewing the risks                            and benefits, the patient was deemed in  satisfactory condition to undergo the procedure.                           After obtaining informed consent, the endoscope was                            passed under direct vision. Throughout the                            procedure, the patient's blood pressure, pulse, and                            oxygen saturations were monitored continuously. The                            Endoscope was introduced through  the mouth, and                            advanced to the second part of duodenum. The upper                            GI endoscopy was accomplished without difficulty.                            The patient tolerated the procedure well. Scope In: Scope Out: Findings:                 Moderate inflammation characterized by erosions,                            erythema and granularity was found in the gastric                            antrum. Biopsies were taken with a cold forceps for                            histology.                           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 No signs of recent                            bleeding.                           Mild reflux related esophagitis (LA Grade A).                           The exam was otherwise without abnormality. Complications:            No immediate complications. Estimated blood loss:  None. Estimated Blood Loss:     Estimated blood loss: none. Impression:               - Moderate inflammation characterized by erosions,                            erythema and granularity was found in the gastric                            antrum. Biopsies were taken with a cold forceps for                            histology.                           - Isolated proximal stomach varices along the                            greater curvature of the stomach. No signs of                            recent bleeding.                           - Mild reflux related esophagitis (LA Grade A).                           - The examination was otherwise normal. Recommendation:           - Patient has a contact number available for                            emergencies. The signs and symptoms of potential                            delayed complications were discussed with the                            patient. Return to normal activities tomorrow.                             Written discharge instructions were provided to the                            patient.                           - Resume previous diet.                           - Continue present medications. Please try to take                            the OTC omeprazole 20mg  every day shortly before  breakfast.                           - Await pathology results. Will treat with                            appropriate antibiotics if you have H. pylori in                            the biopsies. Will also advise on your incidental                            gastric varices at that time. Milus Banister, MD 11/22/2019 9:29:24 AM This report has been signed electronically.

## 2019-11-22 NOTE — Progress Notes (Signed)
Temp by LC Vitals by DT

## 2019-11-24 ENCOUNTER — Telehealth: Payer: Self-pay | Admitting: *Deleted

## 2019-11-24 ENCOUNTER — Telehealth: Payer: Self-pay

## 2019-11-24 NOTE — Telephone Encounter (Signed)
  Follow up Call-  Call back number 11/22/2019  Post procedure Call Back phone  # [phone]  Permission to leave phone message Yes  Some recent data might be hidden     Patient questions:  Do you have a fever, pain , or abdominal swelling? No. Pain Score  0 *  Have you tolerated food without any problems? Yes.    Have you been able to return to your normal activities? Yes.    Do you have any questions about your discharge instructions: Diet   No. Medications  No. Follow up visit  No.  Do you have questions or concerns about your Care? No.  Actions: * If pain score is 4 or above: No action needed, pain <4  1. Have you developed a fever since your procedure? NO  2.   Have you had an respiratory symptoms (SOB or cough) since your procedure? NO  3.   Have you tested positive for COVID 19 since your procedure NO  4.   Have you had any family members/close contacts diagnosed with the COVID 19 since your procedure?  NO   If yes to any of these questions please route to Joylene John, RN and Alphonsa Gin, RN.

## 2019-11-24 NOTE — Telephone Encounter (Signed)
NO ANSWER, MESSAGE LEFT FOR PATIENT. 

## 2019-11-28 ENCOUNTER — Encounter (INDEPENDENT_AMBULATORY_CARE_PROVIDER_SITE_OTHER): Payer: Self-pay | Admitting: Physician Assistant

## 2019-11-28 ENCOUNTER — Ambulatory Visit (INDEPENDENT_AMBULATORY_CARE_PROVIDER_SITE_OTHER): Payer: Medicare Other | Admitting: Physician Assistant

## 2019-11-28 ENCOUNTER — Other Ambulatory Visit: Payer: Self-pay

## 2019-11-28 VITALS — BP 147/85 | HR 65 | Temp 97.5°F | Ht 63.0 in | Wt 183.0 lb

## 2019-11-28 DIAGNOSIS — Z6832 Body mass index (BMI) 32.0-32.9, adult: Secondary | ICD-10-CM

## 2019-11-28 DIAGNOSIS — E8881 Metabolic syndrome: Secondary | ICD-10-CM

## 2019-11-28 DIAGNOSIS — E7849 Other hyperlipidemia: Secondary | ICD-10-CM | POA: Diagnosis not present

## 2019-11-28 DIAGNOSIS — E669 Obesity, unspecified: Secondary | ICD-10-CM | POA: Diagnosis not present

## 2019-11-28 NOTE — Progress Notes (Signed)
Chief Complaint:   OBESITY Dawn [name] is here to discuss her progress with her obesity treatment plan along with follow-up of her obesity related diagnoses. [name] is on the Category 2 Plan and states she is following her eating plan approximately 80% of the time. [name] states she is walking 2-3 miles 5 times per week.  Today's visit was #: 5 Starting weight: 190 lbs Starting date: 10/03/2019 Today's weight: 183 lbs Today's date: 11/28/2019 Total lbs lost to date: 7 Total lbs lost since last in-office visit: 0  Interim History: [name] notes having a colonoscopy and endoscopy in the last 2 weeks and was unable to eat for 2 days. She is back on track now.  Subjective:   Other hyperlipidemia. [name] is on no medications. No chest pain. She cannot tolerate statins. She is exercising regularly.  Lab Results  Component Value Date   CHOL 236 (H) 10/03/2019   HDL 54 10/03/2019   LDLCALC 157 (H) 10/03/2019   TRIG 137 10/03/2019   CHOLHDL 5.0 02/09/2019   Lab Results  Component Value Date   ALT 132 (H) 10/24/2019   AST 53 (H) 10/24/2019   ALKPHOS 51 10/24/2019   BILITOT 0.7 10/24/2019   The 10-year ASCVD risk score [name], et al., 2013) is: 23.5%   Values used to calculate the score:     Age: 69 years     Sex: Female     Is Non-Hispanic African American: No     Diabetic: Yes     Tobacco smoker: No     Systolic Blood Pressure: [id number] mmHg     Is BP treated: Yes     HDL Cholesterol: 54 mg/dL     Total Cholesterol: 236 mg/dL  Insulin resistance. [name] has a diagnosis of insulin resistance based on her elevated fasting insulin level >5. She continues to work on diet and exercise to decrease her risk of diabetes. She is on no medications. No polyphagia. She is exercising regularly.  Lab Results  Component Value Date   INSULIN 24.0 10/03/2019   Lab Results  Component Value Date   HGBA1C 5.4 10/03/2019   Assessment/Plan:   Other hyperlipidemia. Cardiovascular  risk and specific lipid/LDL goals reviewed.  We discussed several lifestyle modifications today and Maitreyi will continue to work on diet, exercise and weight loss efforts. Orders and follow up as documented in patient record.   Counseling Intensive lifestyle modifications are the first line treatment for this issue. . Dietary changes: Increase soluble fiber. Decrease simple carbohydrates. . Exercise changes: Moderate to vigorous-intensity aerobic activity 150 minutes per week if tolerated. . Lipid-lowering medications: see documented in medical record.  Insulin resistance. [name] will continue to work on weight loss, exercise, and decreasing simple carbohydrates to help decrease the risk of diabetes. [name] agreed to follow-up with [name] as directed to closely monitor her progress.  Class 1 obesity with serious comorbidity and body mass index (BMI) of 32.0 to 32.9 in adult, unspecified obesity type.  [name] is currently in the action stage of change. As such, her goal is to continue with weight loss efforts. She has agreed to keeping a food journal and adhering to recommended goals of 1200 calories and 85 grams of protein daily.  Exercise goals: Older adults should follow the adult guidelines. When older adults cannot meet the adult guidelines, they should be as physically active as their abilities and conditions will allow.   Behavioral modification strategies: meal planning and cooking strategies and  keeping healthy foods in the home.  [name] has agreed to follow-up with our clinic in 2 weeks. She was informed of the importance of frequent follow-up visits to maximize her success with intensive lifestyle modifications for her multiple health conditions.   Objective:   Blood pressure (!) 147/85, pulse 65, temperature (!) 97.5 F (36.4 C), temperature source Oral, height 5\' 3"  (1.6 m), weight 183 lb (83 kg), SpO2 98 %. Body mass index is 32.42 kg/m.  General: Cooperative, alert, well  developed, in no acute distress. HEENT: Conjunctivae and lids unremarkable. Cardiovascular: Regular rhythm.  Lungs: Normal work of breathing. Neurologic: No focal deficits.   Lab Results  Component Value Date   CREATININE 0.83 10/24/2019   BUN 15 10/24/2019   NA 139 10/24/2019   K 3.8 10/24/2019   CL 102 10/24/2019   CO2 28 10/24/2019   Lab Results  Component Value Date   ALT 132 (H) 10/24/2019   AST 53 (H) 10/24/2019   ALKPHOS 51 10/24/2019   BILITOT 0.7 10/24/2019   Lab Results  Component Value Date   HGBA1C 5.4 10/03/2019   HGBA1C 5.4 02/09/2019   HGBA1C 5.5 06/07/2018   HGBA1C 5.2 11/19/2017   HGBA1C 5.2 06/04/2017   Lab Results  Component Value Date   INSULIN 24.0 10/03/2019   Lab Results  Component Value Date   TSH 1.851 10/24/2019   Lab Results  Component Value Date   CHOL 236 (H) 10/03/2019   HDL 54 10/03/2019   LDLCALC 157 (H) 10/03/2019   TRIG 137 10/03/2019   CHOLHDL 5.0 02/09/2019   Lab Results  Component Value Date   WBC 4.3 10/24/2019   HGB 14.3 10/24/2019   HCT 40.0 10/24/2019   MCV 98.8 10/24/2019   PLT 181 10/24/2019   Lab Results  Component Value Date   FERRITIN 176 12/17/2012   Obesity Behavioral Intervention Documentation for Insurance:   Approximately 15 minutes were spent on the discussion below.  ASK: We discussed the diagnosis of obesity with [name] today and [name] agreed to give [name] permission to discuss obesity behavioral modification therapy today.  ASSESS: [name] has the diagnosis of obesity and her BMI today is 32.6. [name] is in the action stage of change.   ADVISE: [name] was educated on the multiple health risks of obesity as well as the benefit of weight loss to improve her health. She was advised of the need for long term treatment and the importance of lifestyle modifications to improve her current health and to decrease her risk of future health problems.  AGREE: Multiple dietary modification options and  treatment options were discussed and [name] agreed to follow the recommendations documented in the above note.  ARRANGE: [name] was educated on the importance of frequent visits to treat obesity as outlined per CMS and USPSTF guidelines and agreed to schedule her next follow up appointment today.  Attestation Statements:   Reviewed by clinician on day of visit: allergies, medications, problem list, medical history, surgical history, family history, social history, and previous encounter notes.  IMichaelene Song, am acting as transcriptionist for [name], PA-C   I have reviewed the above documentation for accuracy and completeness, and I agree with the above. [name], PA-C

## 2019-11-29 ENCOUNTER — Encounter: Payer: Self-pay | Admitting: Gastroenterology

## 2019-12-07 ENCOUNTER — Telehealth: Payer: Self-pay | Admitting: Gastroenterology

## 2019-12-07 ENCOUNTER — Other Ambulatory Visit: Payer: Self-pay

## 2019-12-07 ENCOUNTER — Inpatient Hospital Stay: Payer: Medicare Other

## 2019-12-07 VITALS — BP 125/69 | HR 78 | Temp 97.5°F | Resp 18

## 2019-12-07 DIAGNOSIS — C541 Malignant neoplasm of endometrium: Secondary | ICD-10-CM

## 2019-12-07 DIAGNOSIS — M810 Age-related osteoporosis without current pathological fracture: Secondary | ICD-10-CM

## 2019-12-07 DIAGNOSIS — Z170 Estrogen receptor positive status [ER+]: Secondary | ICD-10-CM | POA: Diagnosis not present

## 2019-12-07 DIAGNOSIS — C50211 Malignant neoplasm of upper-inner quadrant of right female breast: Secondary | ICD-10-CM | POA: Diagnosis not present

## 2019-12-07 DIAGNOSIS — Z5112 Encounter for antineoplastic immunotherapy: Secondary | ICD-10-CM | POA: Diagnosis not present

## 2019-12-07 LAB — COMPREHENSIVE METABOLIC PANEL
ALT: 68 U/L — ABNORMAL HIGH (ref 0–44)
AST: 45 U/L — ABNORMAL HIGH (ref 15–41)
Albumin: 3.8 g/dL (ref 3.5–5.0)
Alkaline Phosphatase: 39 U/L (ref 38–126)
Anion gap: 9 (ref 5–15)
BUN: 13 mg/dL (ref 8–23)
CO2: 28 mmol/L (ref 22–32)
Calcium: 9.2 mg/dL (ref 8.9–10.3)
Chloride: 102 mmol/L (ref 98–111)
Creatinine, Ser: 0.79 mg/dL (ref 0.44–1.00)
GFR calc Af Amer: >60 mL/min (ref >60)
GFR calc non Af Amer: >60 mL/min (ref >60)
Glucose, Bld: 118 mg/dL — ABNORMAL HIGH (ref 70–99)
Potassium: 3.6 mmol/L (ref 3.5–5.1)
Sodium: 139 mmol/L (ref 135–145)
Total Bilirubin: 0.6 mg/dL (ref 0.3–1.2)
Total Protein: 7.1 g/dL (ref 6.5–8.1)

## 2019-12-07 LAB — CBC WITH DIFFERENTIAL/PLATELET
Abs Immature Granulocytes: 0.01 10*3/uL (ref 0.00–0.07)
Basophils Absolute: 0 10*3/uL (ref 0.0–0.1)
Basophils Relative: 1 %
Eosinophils Absolute: 0.1 10*3/uL (ref 0.0–0.5)
Eosinophils Relative: 3 %
HCT: 40.1 % (ref 36.0–46.0)
Hemoglobin: 14 g/dL (ref 12.0–15.0)
Immature Granulocytes: 0 %
Lymphocytes Relative: 44 %
Lymphs Abs: 1.8 10*3/uL (ref 0.7–4.0)
MCH: 34.9 pg — ABNORMAL HIGH (ref 26.0–34.0)
MCHC: 34.9 g/dL (ref 30.0–36.0)
MCV: 100 fL (ref 80.0–100.0)
Monocytes Absolute: 0.4 10*3/uL (ref 0.1–1.0)
Monocytes Relative: 10 %
Neutro Abs: 1.7 10*3/uL (ref 1.7–7.7)
Neutrophils Relative %: 42 %
Platelets: 160 10*3/uL (ref 150–400)
RBC: 4.01 MIL/uL (ref 3.87–5.11)
RDW: 12.1 % (ref 11.5–15.5)
WBC: 4.1 10*3/uL (ref 4.0–10.5)
nRBC: 0 % (ref 0.0–0.2)

## 2019-12-07 LAB — VITAMIN D 25 HYDROXY (VIT D DEFICIENCY, FRACTURES): Vit D, 25-Hydroxy: 47.8 ng/mL (ref 30–100)

## 2019-12-07 MED ORDER — SODIUM CHLORIDE 0.9 % IV SOLN
200.0000 mg | Freq: Once | INTRAVENOUS | Status: AC
  Administered 2019-12-07: 200 mg via INTRAVENOUS
  Filled 2019-12-07: qty 8

## 2019-12-07 MED ORDER — HEPARIN SOD (PORK) LOCK FLUSH 100 UNIT/ML IV SOLN
500.0000 [IU] | Freq: Once | INTRAVENOUS | Status: AC | PRN
  Administered 2019-12-07: 500 [IU]
  Filled 2019-12-07: qty 5

## 2019-12-07 MED ORDER — SODIUM CHLORIDE 0.9 % IV SOLN
Freq: Once | INTRAVENOUS | Status: AC
  Filled 2019-12-07: qty 250

## 2019-12-07 MED ORDER — SODIUM CHLORIDE 0.9% FLUSH
10.0000 mL | INTRAVENOUS | Status: DC | PRN
  Administered 2019-12-07: 10 mL
  Filled 2019-12-07: qty 10

## 2019-12-07 NOTE — Patient Instructions (Signed)
[facility] Discharge Instructions for Patients Receiving Chemotherapy  Today you received the following chemotherapy agents: pembrolizumab.  To help prevent nausea and vomiting after your treatment, we encourage you to take your nausea medication as directed.   If you develop nausea and vomiting that is not controlled by your nausea medication, call the clinic.   BELOW ARE SYMPTOMS THAT SHOULD BE REPORTED IMMEDIATELY:  *FEVER GREATER THAN 100.5 F  *CHILLS WITH OR WITHOUT FEVER  NAUSEA AND VOMITING THAT IS NOT CONTROLLED WITH YOUR NAUSEA MEDICATION  *UNUSUAL SHORTNESS OF BREATH  *UNUSUAL BRUISING OR BLEEDING  TENDERNESS IN MOUTH AND THROAT WITH OR WITHOUT PRESENCE OF ULCERS  *URINARY PROBLEMS  *BOWEL PROBLEMS  UNUSUAL RASH Items with * indicate a potential emergency and should be followed up as soon as possible.  Feel free to call the clinic should you have any questions or concerns. The clinic phone number is [phone].  Please show the CHEMO ALERT CARD at check-in to the Emergency Department and triage nurse.   

## 2019-12-07 NOTE — Telephone Encounter (Signed)
Pt received letter with results and recommendation to take Prilosec OTC, She is wondering if she could have a prescription for protonix sent to her pharmacy if she is going to be on med long term as it will go towards her deductible. If possible, pls send prescription to [facility] for 90-day supply.

## 2019-12-09 ENCOUNTER — Other Ambulatory Visit (INDEPENDENT_AMBULATORY_CARE_PROVIDER_SITE_OTHER): Payer: Self-pay | Admitting: Family Medicine

## 2019-12-09 DIAGNOSIS — E559 Vitamin D deficiency, unspecified: Secondary | ICD-10-CM

## 2019-12-09 MED ORDER — PANTOPRAZOLE SODIUM 20 MG PO TBEC: 20.0000 mg | DELAYED_RELEASE_TABLET | Freq: Every day | ORAL | 3 refills | Status: DC

## 2019-12-09 NOTE — Telephone Encounter (Signed)
Protonix 20mg  sent to [facility]

## 2019-12-09 NOTE — Telephone Encounter (Signed)
That is OK with me.  Please prescribe protonix 20mg  pills one pill once daily, disp 3 months with 3 refills.  Thanks

## 2019-12-14 ENCOUNTER — Ambulatory Visit (INDEPENDENT_AMBULATORY_CARE_PROVIDER_SITE_OTHER): Payer: Medicare Other | Admitting: Physician Assistant

## 2019-12-14 ENCOUNTER — Other Ambulatory Visit: Payer: Self-pay

## 2019-12-14 ENCOUNTER — Encounter (INDEPENDENT_AMBULATORY_CARE_PROVIDER_SITE_OTHER): Payer: Self-pay | Admitting: Physician Assistant

## 2019-12-14 VITALS — BP 134/89 | HR 78 | Temp 97.3°F | Ht 63.0 in | Wt 180.0 lb

## 2019-12-14 DIAGNOSIS — Z6831 Body mass index (BMI) 31.0-31.9, adult: Secondary | ICD-10-CM

## 2019-12-14 DIAGNOSIS — E559 Vitamin D deficiency, unspecified: Secondary | ICD-10-CM

## 2019-12-14 DIAGNOSIS — R7989 Other specified abnormal findings of blood chemistry: Secondary | ICD-10-CM | POA: Diagnosis not present

## 2019-12-14 DIAGNOSIS — E669 Obesity, unspecified: Secondary | ICD-10-CM | POA: Diagnosis not present

## 2019-12-14 NOTE — Progress Notes (Signed)
Chief Complaint:   OBESITY Azyiah is here to discuss her progress with her obesity treatment plan along with follow-up of her obesity related diagnoses. Salicia is on keeping a food journal and adhering to recommended goals of 1200 calories and 85 grams of protein and states she is following her eating plan approximately 80% of the time. Merceda states she is walking for 45 minutes 4 times per week.  Today's visit was #: 6 Starting weight: 190 lbs Starting date: 10/03/2019 Today's weight: 180 lbs Today's date: 12/14/2019 Total lbs lost to date: 10 lbs Total lbs lost since last in-office visit: 3 lbs  Interim History: Denina did well with her weight loss.  She states that she has been going over her calories some and not always reaching her protein goal.  She is traveling to the mountains this weekend.  Subjective:   1. Vitamin D deficiency Kleo's Vitamin D level was 47.80 on 12/07/2019. She is currently taking prescription vitamin D 50,000 IU each week. She denies nausea, vomiting or muscle weakness.  2. Elevated LFTs Liver enzymes have improved.  No nausea, vomiting, jaundice, or abdominal pain.  Lab Results  Component Value Date   ALT 68 (H) 12/07/2019   AST 45 (H) 12/07/2019   ALKPHOS 39 12/07/2019   BILITOT 0.6 12/07/2019   Assessment/Plan:   1. Vitamin D deficiency Low Vitamin D level contributes to fatigue and are associated with obesity, breast, and colon cancer. She agrees to change to OTC Vitamin D @5 ,000 IU daily and will follow-up for routine testing of Vitamin D, at least 2-3 times per year to avoid over-replacement.  2. Elevated LFTs We discussed the likely diagnosis of non-alcoholic fatty liver disease today and how this condition is obesity related. Quetzaly was educated the importance of weight loss. Moksha agreed to continue with her weight loss efforts with healthier diet and exercise as an essential part of her treatment plan.  3. Class 1 obesity with  serious comorbidity and body mass index (BMI) of 31.0 to 31.9 in adult, unspecified obesity type Erlyne is currently in the action stage of change. As such, her goal is to continue with weight loss efforts. She has agreed to keeping a food journal and adhering to recommended goals of 1200 calories and 80 grams of protein daily.   Exercise goals: As is.  Behavioral modification strategies: meal planning and cooking strategies, travel eating strategies and planning for success.  Deborahh has agreed to follow-up with our clinic in 2 weeks. She was informed of the importance of frequent follow-up visits to maximize her success with intensive lifestyle modifications for her multiple health conditions.   Objective:   Blood pressure 134/89, pulse 78, temperature (!) 97.3 F (36.3 C), temperature source Oral, height 5\' 3"  (1.6 m), weight 180 lb (81.6 kg), SpO2 94 %. Body mass index is 31.89 kg/m.  General: Cooperative, alert, well developed, in no acute distress. HEENT: Conjunctivae and lids unremarkable. Cardiovascular: Regular rhythm.  Lungs: Normal work of breathing. Neurologic: No focal deficits.   Lab Results  Component Value Date   CREATININE 0.79 12/07/2019   BUN 13 12/07/2019   NA 139 12/07/2019   K 3.6 12/07/2019   CL 102 12/07/2019   CO2 28 12/07/2019   Lab Results  Component Value Date   ALT 68 (H) 12/07/2019   AST 45 (H) 12/07/2019   ALKPHOS 39 12/07/2019   BILITOT 0.6 12/07/2019   Lab Results  Component Value Date   HGBA1C 5.4  10/03/2019   HGBA1C 5.4 02/09/2019   HGBA1C 5.5 06/07/2018   HGBA1C 5.2 11/19/2017   HGBA1C 5.2 06/04/2017   Lab Results  Component Value Date   INSULIN 24.0 10/03/2019   Lab Results  Component Value Date   TSH 1.851 10/24/2019   Lab Results  Component Value Date   CHOL 236 (H) 10/03/2019   HDL 54 10/03/2019   LDLCALC 157 (H) 10/03/2019   TRIG 137 10/03/2019   CHOLHDL 5.0 02/09/2019   Lab Results  Component Value Date   WBC 4.1  12/07/2019   HGB 14.0 12/07/2019   HCT 40.1 12/07/2019   MCV 100.0 12/07/2019   PLT 160 12/07/2019   Lab Results  Component Value Date   FERRITIN 176 12/17/2012   Obesity Behavioral Intervention Documentation for Insurance:   Approximately 15 minutes were spent on the discussion below.  ASK: We discussed the diagnosis of obesity with [name] today and Keyshla agreed to give [name] permission to discuss obesity behavioral modification therapy today.  ASSESS: Pashen has the diagnosis of obesity and her BMI today is 31.9. Keshayla is in the action stage of change.   ADVISE: Ramiyah was educated on the multiple health risks of obesity as well as the benefit of weight loss to improve her health. She was advised of the need for long term treatment and the importance of lifestyle modifications to improve her current health and to decrease her risk of future health problems.  AGREE: Multiple dietary modification options and treatment options were discussed and Nedda agreed to follow the recommendations documented in the above note.  ARRANGE: Minnie was educated on the importance of frequent visits to treat obesity as outlined per CMS and USPSTF guidelines and agreed to schedule her next follow up appointment today.  Attestation Statements:   Reviewed by clinician on day of visit: allergies, medications, problem list, medical history, surgical history, family history, social history, and previous encounter notes.  I, Water quality scientist, CMA, am acting as Location manager for [employer], PA-C.  I have reviewed the above documentation for accuracy and completeness, and I agree with the above. [name], PA-C

## 2019-12-22 NOTE — Progress Notes (Signed)
[name]   [phone] Fax:([phone]   Clinic Follow up Note   Patient Care Team: [name], MD as PCP - Haskell Riling, MD (Inactive) as Consulting Physician (General Surgery) Truitt Merle, MD as Consulting Physician (Hematology) [name], MD as Attending Physician (Radiation Oncology)  Date of Service:  12/28/2019  CHIEF COMPLAINT: F/u on breast and endometrial cancers  SUMMARY OF ONCOLOGIC HISTORY: Oncology History Overview Note  MSI high Cancer Staging Endometrial cancer [facility]) Staging form: Corpus Uteri - Adenosarcoma, AJCC 8th Edition - Pathologic stage from 06/09/2017: Stage IIIC (pT1a, pN1, cM0) - Signed by Truitt Merle, MD on 06/16/2017  Malignant neoplasm of upper-inner quadrant of right breast in female, estrogen receptor positive (Dawn [name]) Staging form: Breast, AJCC 8th Edition - Clinical stage from 05/06/2017: Stage IA (cT1c, cN0, cM0, G2, ER: Positive, PR: Negative, HER2: Positive) - Unsigned - Pathologic stage from 05/25/2017: Stage IIA (pT2, pN0, cM0, G2, ER: Positive, PR: Negative, HER2: Negative) - Signed by Truitt Merle, MD on 06/16/2017     Malignant neoplasm of upper-inner quadrant of right breast in female, estrogen receptor positive ([place])  04/27/2017 Initial Biopsy   Diagnosis Breast, right, needle core biopsy INVASIVE DUCTAL CARCINOMA, GRADE 2 Microscopic Comment The neoplasm has intracellular mucin and signet ring cell features. Immunostains shows these cells are positive for ER,GATA3, ck7 and GCDFP, negative for ck20, cdx2, TTF-1 and pax8, The immunostaining pattern supports the neoplasm is breast primary.   04/27/2017 Receptors her2   Estrogen Receptor: 70%, POSITIVE, MODERATE STAINING INTENSITY Progesterone Receptor: 0%, NEGATIVE Proliferation Marker Ki67: 30% HER2 - **POSITIVE** RATIO OF HER2/CEP17 SIGNALS 2.91 AVERAGE HER2 COPY NUMBER PER CELL 7.28   04/27/2017 Initial Diagnosis   Malignant  neoplasm of upper-inner quadrant of right breast in female, estrogen receptor positive (Dawn [name])   05/07/2017 Imaging   CT Chest W Contrast 05/07/17 IMPRESSION: Tiny well-defined fatty lesion on the pleura at the right lung base. The thinner slice collimation used for today's chest CT eliminates volume-averaging seen in the lesion on the prior exam and confirms that this is a diffusely fatty nodule. This is a benign finding and likely represents a tiny lipoma. No defect in the hemidiaphragm evident to suggest tiny diaphragmatic hernia. Pulmonary hamartoma a consideration although the lack of soft tissue components makes this less likely.   05/15/2017 Genetic Testing   Patient had genetic testing due to a personal history of breast cancer and uterine cancer as well as a family history of cancer. The Multi-Cancer panel was ordered. The Multi-Cancer Panel offered by [facility] includes sequencing and/or deletion duplication testing of the following 83 genes: ALK, APC, ATM, AXIN2,BAP1,  BARD1, BLM, BMPR1A, BRCA1, BRCA2, BRIP1, CASR, CDC73, CDH1, CDK4, CDKN1B, CDKN1C, CDKN2A (p14ARF), CDKN2A (p16INK4a), CEBPA, CHEK2, CTNNA1, DICER1, DIS3L2, EGFR (c.2369C>T, p.Thr790Met variant only), EPCAM (Deletion/duplication testing only), FH, FLCN, GATA2, GPC3, GREM1 (Promoter region deletion/duplication testing only), HOXB13 (c.251G>A, p.Gly84Glu), HRAS, KIT, MAX, MEN1, MET, MITF (c.952G>A, p.Glu318Lys variant only), MLH1, MSH2, MSH3, MSH6, MUTYH, NBN, NF1, NF2, NTHL1, PALB2, PDGFRA, PHOX2B, PMS2, POLD1, POLE, POT1, PRKAR1A, PTCH1, PTEN, RAD50, RAD51C, RAD51D, RB1, RECQL4, RET, RUNX1, SDHAF2, SDHA (sequence changes only), SDHB, SDHC, SDHD, SMAD4, SMARCA4, SMARCB1, SMARCE1, STK11, SUFU, TERC, TERT, TMEM127, TP53, TSC1, TSC2, VHL, WRN and WT1.   Results: No pathogenic mutations were identified. A VUS in the GATA2 gene c.1348G>A (p.Gly450Arg) was identified.  The date of this test report is 05/25/2017.     05/25/2017 Surgery     RIGHT BREAST LUMPECTOMY WITH  RADIOACTIVE SEED AND  RIGHT AXILLARY SENTINEL LYMPH NODE BIOPSY and Pot placement by Dr. Excell Seltzer 05/25/17   05/25/2017 Pathology Results   Diagnosis 05/25/17 1. Breast, lumpectomy, right - INVASIVE DUCTAL CARCINOMA, GRADE II/III, SPANNING 2.2 CM. - DUCTAL CARCINOMA IN SITU, HIGH GRADE. - THE SURGICAL RESECTION MARGINS ARE NEGATIVE FOR CARCINOMA. - SEE ONCOLOGY TABLE BELOW. 2. Breast, excision, right additional superior margin - BENIGN FIBROADIPOSE TISSUE. - BENIGN SKELETAL MUSCLE. - SEE COMMENT. 3. Breast, excision, right additional lateral margin - BENIGN BREAST PARENCHYMA. - THERE IS NO EVIDENCE OF MALIGNANCY. - SEE COMMENT. 4. Breast, excision, chest wall margin - BENIGN FIBROADIPOSE TISSUE. - THERE IS NO EVIDENCE OF MALIGNANCY. - SEE COMMENT. 5. Lymph node, sentinel, biopsy, right axillary - THERE IS NO EVIDENCE OF CARCINOMA IN 1 OF 1 LYMPH NODE (0/1). 6. Lymph node, sentinel, biopsy, right axillary - THERE IS NO EVIDENCE OF CARCINOMA IN 1 OF 1 LYMPH NODE (0/1). 7. Lymph node, sentinel, biopsy, right axillary - THERE IS NO EVIDENCE OF CARCINOMA IN 1 OF 1 LYMPH NODE (0/1).   06/26/2017 PET scan   PET  IMPRESSION: 1. Postoperative findings both in the right breast and in the anatomic pelvis, with associated low-grade activity considered to be postoperative in nature. No hypermetabolic adenopathy or hypermetabolic lesions are identified to suggest active metastatic disease/malignancy. 2. Other imaging findings of potential clinical significance: Aortic Atherosclerosis (ICD10-I70.0). Sigmoid colon diverticulosis. Biapical pleuroparenchymal scarring in the lungs. Small amount of free pelvic fluid in the cul-de-sac, likely postoperative.    07/01/2017 - 10/14/2017 Chemotherapy   Adjuvant TCH with Onpro every 3 weeks for 6 cycles starting on 07/01/17, Changed Taxol to abraxane with cycle 4 due to drug rash reaction, followed by Herceptin every 3  weeks for 6 months    11/01/2017 - 12/08/2017 Radiation Therapy   Radiation therapy to her right breast with [name]   11/04/2017 - 06/2018 Chemotherapy   Maintenance Herceptin starting 11/04/17 and will comeplete her 12 months in 06/2018    12/23/2017 -  Anti-estrogen oral therapy   Adjuvant letrozole 2.5 mg daily started 12/23/17, switched to exemestane on 04/21/18 due to joint pain. Could not afford aromasin, started anastrozole 04/2018.    12/30/2017 Imaging   CT CAP W Contrast 12/30/17 IMPRESSION: Increased size of 1.1 cm aorto-caval retroperitoneal lymph node. This could be reactive due to interval hysterectomy, however metastatic carcinoma cannot be excluded. Consider continued attention on short-term follow-up CT, or PET-CT scan for further evaluation.  Mild hepatic steatosis.   01/25/2018 PET scan   IMPRESSION: 1. Enlarging hypermetabolic aortocaval lymph node is worrisome for metastatic disease. 2. Probable postoperative seroma in the medial right breast, with associated mild inflammatory hypermetabolism.   02/17/2018 -  Antibody Plan   Keytruda every 3 weeks starting 02/17/18. Plan for 2 years    04/28/2018 Mammogram   Probably benign. Calcifications in the right breast most likely are dystrophic, related to previous surgery, and are probably benign.    04/28/2018 Imaging   DEXA Scan T Score -2.0   06/10/2018 Echocardiogram   06/10/2018 ECHO LV EF: 55% -   60%   08/28/2018 - 08/2018 Chemotherapy   Neratinib 17m for 1 week then titrate up with 1 additional tablet weekly up to 12 mg starting 08/28/18. Stopped soon after starting due to diarrhea.   10/25/2018 Imaging   CT CAP 10/25/18  IMPRESSION: 1. No evidence of metastatic disease. 2.  Aortic atherosclerosis (ICD10-170.0).   06/15/2019 Pathology Results   Diagnosis Breast, right,  needle core biopsy, 1 o'clock, 7cmfn - DENSE FIBROSIS CONSISTENT WITH PRIOR PROCEDURAL CHANGES. - NO MALIGNANCY IDENTIFIED.   10/24/2019 Imaging    CT CAP W Contrast  IMPRESSION: 1. Stable exam. No evidence of recurrent or metastatic carcinoma within the chest, abdomen, or pelvis. 2. Colonic diverticulosis, without radiographic evidence of diverticulitis.   Aortic Atherosclerosis (ICD10-I70.0).   Endometrial adenocarcinoma ([place])  05/07/2017 Initial Diagnosis   Endometrial adenocarcinoma ([place])   06/09/2017 Surgery   XI ROBOTIC ASSISTED TOTAL LAPOROSCOPIC HYSTERECTOMY WITH BILATERAL SALPINGO OOPHORECTOMY and SENTINEL NODE BIOPSY by Dr. Denman George 06/09/17   06/09/2017 Pathology Results   Diagnosis 06/09/17 1. Lymph node, sentinel, biopsy, right external iliac - METASTATIC ADENOCARCINOMA IN ONE LYMPH NODE (1/1). 2. Lymph node, sentinel, biopsy, left obturator - ONE BENIGN LYMPH NODE (0/1). 3. Lymph node, sentinel, biopsy, left external iliac - METASTATIC ADENOCARCINOMA IN ONE LYMPH NODE (1/1). 4. Uterus +/- tubes/ovaries, neoplastic ENDOMETRIUM: - ENDOMETRIAL ADENOCARCINOMA, 3.2 CM. - CARCINOMA INVADES INNER HALF OF MYOMETRIUM. 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 - CERVIX, BILATERAL FALLOPIAN TUBES AND BILATERAL OVARIES FREE OF TUMOR   08/18/2017 - 09/17/2017 Radiation Therapy   vaginal brachytherapy per 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   02/17/2018 -  Chemotherapy   Keytruda every 3 weeks starting 02/17/18   04/11/2019 Imaging   CT AP W Contrast  IMPRESSION: 1. No evidence of recurrent or metastatic carcinoma within the abdomen or pelvis. 2. Colonic diverticulosis. No radiographic evidence of diverticulitis.        CURRENT THERAPY:  -Keytruda every 3 weeksstarting 02/17/18.Complete on 02/06/20  -Letrozole 2.74m daily starting 12/23/17. Switched to Anastrozole in 04/2018 due to joint pain  INTERVAL HISTORY:  Dawn MUSSELLis here for a follow up and treatment. She presents to the clinic  alone. She notes both numbness and tingling in her feet. She notes this recently recurred in the past couple week. She notes she has increased her exercising with walking. She has been taking Vit B every other day but not Gabapentin.   REVIEW OF SYSTEMS:   Constitutional: Denies fevers, chills or abnormal weight loss Eyes: Denies blurriness of vision Ears, nose, mouth, throat, and face: Denies mucositis or sore throat Respiratory: Denies cough, dyspnea or wheezes Cardiovascular: Denies palpitation, chest discomfort or lower extremity swelling Gastrointestinal:  Denies nausea, heartburn or change in bowel habits Skin: Denies abnormal skin rashes Lymphatics: Denies new lymphadenopathy or easy bruising Neurological: (+) tingling and numbness of her feet.  Behavioral/Psych: Mood is stable, no new changes  All other systems were reviewed with the patient and are negative.  MEDICAL HISTORY:  Past Medical History:  Diagnosis Date  . Anemia    as a child.  . Arthritis   . Bilateral cataracts   . Bilateral leg cramps   . [place]    skin  . Dyspnea   . Endometrial cancer ([place]   . Family history of bladder cancer   . Family history of colon cancer in father   . Fatty liver   . Fuchs' corneal dystrophy   . GERD (gastroesophageal reflux disease)   . Hearing loss    Right side 30%  . Heartburn   . History of hiatal hernia    small size  . History of radiation therapy 08/21/17, 08/28/17,09/01/17, 09/11/17, 09/17/17   Vaginal cuff brachytherapy.   .Marland KitchenHistory of radiation therapy  11/11/17- 12/08/17   Right Breast treated to 40.05 Gy with 15 fx of 2.67 Gy and a boost of 10 Gy with 5 fx.   . Hyperlipidemia   . Hypertension   . Hypothyroidism   . IBS (irritable bowel syndrome)    hx of  . Malignant neoplasm of upper-inner quadrant of right female breast ([place]) 04/2017  . NAFL (nonalcoholic fatty liver)   . Obesity   . Osteoarthritis   . Osteopenia   . PONV (postoperative nausea and  vomiting)   . 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 Uterine cancer ([place]) 04/2017   endometrial cancer  . Varices, gastric   . Vertigo     SURGICAL HISTORY: Past Surgical History:  Procedure Laterality Date  . BREAST BIOPSY Right 04/27/2017  . BREAST LUMPECTOMY WITH RADIOACTIVE SEED AND SENTINEL LYMPH NODE BIOPSY Right 05/25/2017   Procedure: RIGHT BREAST LUMPECTOMY WITH RADIOACTIVE SEED AND  RIGHT AXILLARY SENTINEL LYMPH NODE BIOPSY;  Surgeon: Excell Seltzer, MD;  Location: [facility];  Service: General;  Laterality: Right;  . CATARACT EXTRACTION, BILATERAL    . COLONOSCOPY    . HYSTEROSCOPY WITH D & C N/A 04/29/2017   Procedure: DILATATION AND CURETTAGE /HYSTEROSCOPY;  Surgeon: Linda Hedges, DO;  Location: [facility];  Service: Gynecology;  Laterality: N/A;  . PORTACATH PLACEMENT Left 05/25/2017   Procedure: INSERTION PORT-A-CATH;  Surgeon: Excell Seltzer, MD;  Location: [facility];  Service: General;  Laterality: Left;  . ROBOTIC ASSISTED TOTAL HYSTERECTOMY WITH BILATERAL SALPINGO OOPHERECTOMY N/A 06/09/2017   Procedure: XI ROBOTIC ASSISTED TOTAL LAPOROSCOPIC HYSTERECTOMY WITH BILATERAL SALPINGO OOPHORECTOMY;  Surgeon: Everitt Amber, MD;  Location: [facility];  Service: Gynecology;  Laterality: N/A;  . SENTINEL NODE BIOPSY N/A 06/09/2017   Procedure: SENTINEL NODE BIOPSY;  Surgeon: Everitt Amber, MD;  Location: [facility];  Service: Gynecology;  Laterality: N/A;    I have reviewed the social history and family history with the patient and they are unchanged from previous note.  ALLERGIES:  is allergic to ciprofloxacin; paclitaxel; crestor [rosuvastatin calcium]; fosamax [alendronate sodium]; and penicillins.  MEDICATIONS:  Current Outpatient Medications  Medication Sig Dispense Refill  . acetaminophen (TYLENOL) 500 MG tablet Take 500 mg by mouth every 6 (six) hours as needed for moderate pain.    Marland Kitchen anastrozole (ARIMIDEX) 1 MG  tablet TAKE 1 TABLET BY MOUTH  DAILY 90 tablet 3  . Ascorbic Acid (VITAMIN C) 1000 MG tablet Take 1,000 mg by mouth daily.    Marland Kitchen b complex vitamins capsule Take 1 capsule by mouth daily.    . diphenhydrAMINE (BENADRYL) 25 MG tablet Take 25-50 mg by mouth every 6 (six) hours as needed for sleep.    . fluticasone (FLONASE) 50 MCG/ACT nasal spray Place 2 sprays into both nostrils daily.    Marland Kitchen gabapentin (NEURONTIN) 100 MG capsule Take 1 capsule (100 mg total) by mouth 3 (three) times daily. 90 capsule 1  . ibandronate (BONIVA) 150 MG tablet Take 1 tablet (150 mg total) by mouth every 30 (thirty) days. 3 tablet 3  . levothyroxine (SYNTHROID) 50 MCG tablet TAKE 1 TABLET BY MOUTH  DAILY 90 tablet 3  . lidocaine-prilocaine (EMLA) cream APPLY TO AFFECTED AREA ONCE 30 g 2  . Multiple Vitamin (MULTIVITAMIN) capsule Take 1 capsule by mouth daily.      Marland Kitchen olmesartan-hydrochlorothiazide (BENICAR HCT) 20-12.5 MG tablet TAKE 1 TABLET BY MOUTH  DAILY 90 tablet 3  . omeprazole (PRILOSEC)  20 MG capsule Take 1 capsule shortly before dinner meal each day 30 capsule 0  . pantoprazole (PROTONIX) 20 MG tablet Take 1 tablet (20 mg total) by mouth daily. 90 tablet 3  . potassium chloride SA (KLOR-CON) 20 MEQ tablet TAKE 1 TABLET BY MOUTH  DAILY 90 tablet 3  . sodium chloride (MURO 128) 5 % ophthalmic solution Place 2 drops into both eyes daily.    Marland Kitchen triamcinolone cream (KENALOG) 0.1 % APPLY TO ITCHY SPOTS ONCE DAILY  1  . Vitamin D, Ergocalciferol, (DRISDOL) 1.25 MG (50000 UNIT) CAPS capsule Take 1 capsule (50,000 Units total) by mouth every 7 (seven) days. 4 capsule 0   No current facility-administered medications for this visit.   Facility-Administered Medications Ordered in Other Visits  Medication Dose Route Frequency Provider Last Rate Last Admin  . sodium chloride flush (NS) 0.9 % injection 10 mL  10 mL Intracatheter PRN Truitt Merle, MD   10 mL at 08/25/18 1111    PHYSICAL EXAMINATION: ECOG PERFORMANCE STATUS: 1 -  Symptomatic but completely ambulatory  Vitals:   12/28/19 1005  BP: 131/72  Pulse: 71  Resp: 20  Temp: 98.2 F (36.8 C)  SpO2: 98%   Filed Weights   12/28/19 1005  Weight: 184 lb 3.2 oz (83.6 kg)    Due to COVID19 we will limit examination to appearance. Patient had no complaints.  GENERAL:alert, no distress and comfortable SKIN: skin color normal, no rashes or significant lesions EYES: normal, Conjunctiva are pink and non-injected, sclera clear  NEURO: alert & oriented x 3 with fluent speech   LABORATORY DATA:  I have reviewed the data as listed CBC Latest Ref Rng & Units 12/07/2019 10/24/2019 10/05/2019  WBC 4.0 - 10.5 K/uL 4.1 4.3 3.8(L)  Hemoglobin 12.0 - 15.0 g/dL 14.0 14.3 13.7  Hematocrit 36.0 - 46.0 % 40.1 40.0 38.8  Platelets 150 - 400 K/uL 160 181 156     CMP Latest Ref Rng & Units 12/07/2019 10/24/2019 10/05/2019  Glucose 70 - 99 mg/dL 118(H) 114(H) 142(H)  BUN 8 - 23 mg/dL _0 Creatinine 0.44 - 1.00 mg/dL 0.79 0.83 0.79  Sodium 135 - 145 mmol/L 139 139 138  Potassium 3.5 - 5.1 mmol/L 3.6 3.8 3.7  Chloride 98 - 111 mmol/L 102 102 103  CO2 22 - 32 mmol/L _1 Calcium 8.9 - 10.3 mg/dL 9.2 9.2 9.0  Total Protein 6.5 - 8.1 g/dL 7.1 7.6 7.2  Total Bilirubin 0.3 - 1.2 mg/dL 0.6 0.7 0.7  Alkaline Phos 38 - 126 U/L 39 51 42  AST 15 - 41 U/L 45(H) 53(H) 81(H)  ALT 0 - 44 U/L 68(H) 132(H) 115(H)    Upper Endoscopy by Dr. Ardis Hughs 11/22/19  IMPRESSION - Moderate inflammation characterized by erosions, erythema and granularity was found in the gastric antrum. Biopsies were taken with a cold forceps for histology. - Isolated proximal stomach varices along the greater curvature of the stomach. No signs of recent bleeding. - Mild reflux related esophagitis (LA Grade A). - The examination was otherwise normal.  Colonoscopy  IMPRESSION - Two 2 to 4 mm polyps in the transverse colon, removed with a cold snare. Resected and retrieved. - Diverticulosis in the left  colon. - The examination was otherwise normal on direct and retroflexion views.  Diagnosis 1. Surgical [P], colon, transverse, polyp (2) - TUBULAR ADENOMA. - SESSILE SERRATED POLYP WITHOUT DYSPLASIA. - NO HIGH GRADE DYSPLASIA OR MALIGNANCY. 2. Surgical [P], gastric antrum and  gastric body - MILD CHRONIC GASTRITIS. - WARTHIN-STARRY IS NEGATIVE FOR HELICOBACTER PYLORI. - NO INTESTINAL METAPLASIA, DYSPLASIA, OR MALIGNANCY.   RADIOGRAPHIC STUDIES: I have personally reviewed the radiological images as listed and agreed with the findings in the report. No results found.   ASSESSMENT & PLAN:  DOMINIK LAURICELLA is a 69 y.o. female with    1. 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 Treated with right breast lumpectomy, adjuvant chemo and radiation.She tried Nerlynx but could not tolerate due to diarrhea,and does not want try again. -She started anti-estrogen therapy with letrozole. Due to joint pain I switched her to Anastrozole in 04/2018. She is tolerating with mild joint pain, hot flashes and weight gain.  -Continue anastrozole -She has received both her COVID19 vaccinations.    2. Endometrial adenocarcinomawith lymph node metastasis, pT1apN1M0, FIGO stage IIIc, MSI-H, probable RP node recurrence in 12/2017 -Diagnosed in 04/2017. Treated with hysterectomy, BSO, adjuvant radiation, and chemo with Carbotaxol. Currently on Keytruda every 3 weeksfor presumed retroperitoneal lymph node metastasis since 02/17/18. Tolerating well. She will continue to complete 2 years treatment.If noevidence of progression, plan to complete in 02/2020. -Her CT CAP from 10/26/19 shows NED for endometrial cancer.  -She continues to tolerate treatment but has recent recurrence of neuropathy of feet. Will monitor on Gabapentin.  -Labs from last month are stable. Proceed with Keytruda today and continue every 3 weeks  until May 24 when she will finish 2 years therapy.  -Continue PAC flush every 6-8 weeks.  -F/u in 4 months with CT Scan.    3. HTN, Acid reflux, High Cholesterol -WellControlled, Continue tof/u with PCP -She has had acid reflux causing upper back pain lately. I encouraged her to take antiacid which is fine to take at night given her levothyroxine.  4. Transaminitis -Secondary to fatty liver disease -Stable   5.Osteopenia -DEXA scan in 05/2018 revealed T score of -2.0. -She is on monthly boniva, prescribed by her PCP, since 02/2018.Continue -ContinueVitamin D and Calcium supplements.Will check her Vitamin D level every 6 months.   6.Metabolic syndrome, hyperglycemia  -She has no history of diabetes. Her random blood glucose was 213, we discussed diabetic diet and exercise -She was recently found to have abnormal TSH levels. She has been seen by endocrinologist and has startedlevothyroxineon 01/04/19. Will monitor TSH level every 3 weeks.  -Her weight does continue to trend up. I encouraged her to remainactive. -Continue to follow-up with her primary care physician  7. Hypokalemia -On KCL 64mq daily, K normal at 3.8 today   8. Weight Gain  -She is interested in weight loss -[facility] and has been able to lose some weight recently. She will continue.   9. Neuropathy  -She notes in the past few weeks she had numbness and tingling return to her feet (S/p C32).  -She has been on Vit B every other day, she can continue.  -I will call in 108mGabapentin up to 3 tabs nightly (12/28/19). I reviewed side effect of drowsiness. She is willing to try.    Plan -I called in 10033mabapentin today  -will proceed with Keytruda today  -Keytruda in 3 and 6 weeks  -Lab and flush in 3 weeks -PAC flush in 12 weeks -F/u in 4 months with lab and CT AP a few days before.  -Continue anastrozole  -[place] [facility]  [place]  No problem-specific Assessment & Plan notes found for this encounter.   Orders Placed This Encounter  Procedures  . MM DIAG BREAST TOMO BILATERAL    Standing Status:   Future    Standing Expiration Date:   12/27/2020    Scheduling Instructions:     [facility]    Order Specific Question:   Reason for Exam (SYMPTOM  OR DIAGNOSIS REQUIRED)    Answer:   screening    Order Specific Question:   Preferred imaging location?    Answer:   [facility]    Order Specific Question:   Release to patient    Answer:   Immediate  . CT Abdomen Pelvis W Contrast    Standing Status:   Future    Standing Expiration Date:   12/27/2020    Order Specific Question:   If indicated for the ordered procedure, I authorize the administration of contrast media per Radiology protocol    Answer:   Yes    Order Specific Question:   Preferred imaging location?    Answer:   [facility]    Order Specific Question:   Release to patient    Answer:   Immediate    Order Specific Question:   Is Oral Contrast requested for this exam?    Answer:   Yes, Per Radiology protocol    Order Specific Question:   Radiology Contrast Protocol - do NOT remove file path    Answer:   [email or web address]   All questions were answered. The patient knows to call the clinic with any problems, questions or concerns. No barriers to learning was detected. The total time spent in the appointment was 30 minutes.     Truitt Merle, MD 12/28/2019   I, Joslyn Devon, am acting as scribe for Truitt Merle, MD.   I have reviewed the above documentation for accuracy and completeness, and I agree with the above.

## 2019-12-23 NOTE — Progress Notes (Signed)
Pharmacist Chemotherapy Monitoring - Follow Up Assessment    I verify that I have reviewed each item in the below checklist:  . Regimen for the patient is scheduled for the appropriate day and plan matches scheduled date. Marland Kitchen Appropriate non-routine labs are ordered dependent on drug ordered. . If applicable, additional medications reviewed and ordered per protocol based on lifetime cumulative doses and/or treatment regimen.   Plan for follow-up and/or issues identified: No . I-vent associated with next due treatment: No . MD and/or nursing notified: No  Dawn [name] [facility] 12/23/2019 1:42 PM

## 2019-12-28 ENCOUNTER — Inpatient Hospital Stay: Payer: Medicare Other

## 2019-12-28 ENCOUNTER — Encounter: Payer: Self-pay | Admitting: Hematology

## 2019-12-28 ENCOUNTER — Inpatient Hospital Stay (HOSPITAL_BASED_OUTPATIENT_CLINIC_OR_DEPARTMENT_OTHER): Payer: Medicare Other | Admitting: Hematology

## 2019-12-28 ENCOUNTER — Other Ambulatory Visit: Payer: Self-pay

## 2019-12-28 ENCOUNTER — Inpatient Hospital Stay: Payer: Medicare Other | Attending: Hematology

## 2019-12-28 VITALS — BP 131/72 | HR 71 | Temp 98.2°F | Resp 20 | Ht 63.0 in | Wt 184.2 lb

## 2019-12-28 DIAGNOSIS — C779 Secondary and unspecified malignant neoplasm of lymph node, unspecified: Secondary | ICD-10-CM | POA: Insufficient documentation

## 2019-12-28 DIAGNOSIS — I10 Essential (primary) hypertension: Secondary | ICD-10-CM | POA: Insufficient documentation

## 2019-12-28 DIAGNOSIS — C50211 Malignant neoplasm of upper-inner quadrant of right female breast: Secondary | ICD-10-CM | POA: Insufficient documentation

## 2019-12-28 DIAGNOSIS — Z8052 Family history of malignant neoplasm of bladder: Secondary | ICD-10-CM | POA: Insufficient documentation

## 2019-12-28 DIAGNOSIS — E039 Hypothyroidism, unspecified: Secondary | ICD-10-CM | POA: Insufficient documentation

## 2019-12-28 DIAGNOSIS — Z90710 Acquired absence of both cervix and uterus: Secondary | ICD-10-CM | POA: Insufficient documentation

## 2019-12-28 DIAGNOSIS — Z9221 Personal history of antineoplastic chemotherapy: Secondary | ICD-10-CM | POA: Insufficient documentation

## 2019-12-28 DIAGNOSIS — G629 Polyneuropathy, unspecified: Secondary | ICD-10-CM | POA: Insufficient documentation

## 2019-12-28 DIAGNOSIS — Z90722 Acquired absence of ovaries, bilateral: Secondary | ICD-10-CM | POA: Insufficient documentation

## 2019-12-28 DIAGNOSIS — Z79899 Other long term (current) drug therapy: Secondary | ICD-10-CM | POA: Insufficient documentation

## 2019-12-28 DIAGNOSIS — K219 Gastro-esophageal reflux disease without esophagitis: Secondary | ICD-10-CM | POA: Diagnosis not present

## 2019-12-28 DIAGNOSIS — M8580 Other specified disorders of bone density and structure, unspecified site: Secondary | ICD-10-CM | POA: Insufficient documentation

## 2019-12-28 DIAGNOSIS — Z800 Family history of malignant neoplasm of digestive organs: Secondary | ICD-10-CM | POA: Insufficient documentation

## 2019-12-28 DIAGNOSIS — N951 Menopausal and female climacteric states: Secondary | ICD-10-CM | POA: Insufficient documentation

## 2019-12-28 DIAGNOSIS — Z923 Personal history of irradiation: Secondary | ICD-10-CM | POA: Insufficient documentation

## 2019-12-28 DIAGNOSIS — Z95828 Presence of other vascular implants and grafts: Secondary | ICD-10-CM

## 2019-12-28 DIAGNOSIS — Z9079 Acquired absence of other genital organ(s): Secondary | ICD-10-CM | POA: Insufficient documentation

## 2019-12-28 DIAGNOSIS — Z170 Estrogen receptor positive status [ER+]: Secondary | ICD-10-CM | POA: Insufficient documentation

## 2019-12-28 DIAGNOSIS — K589 Irritable bowel syndrome without diarrhea: Secondary | ICD-10-CM | POA: Insufficient documentation

## 2019-12-28 DIAGNOSIS — Z79811 Long term (current) use of aromatase inhibitors: Secondary | ICD-10-CM | POA: Insufficient documentation

## 2019-12-28 DIAGNOSIS — C541 Malignant neoplasm of endometrium: Secondary | ICD-10-CM

## 2019-12-28 DIAGNOSIS — Z5112 Encounter for antineoplastic immunotherapy: Secondary | ICD-10-CM | POA: Insufficient documentation

## 2019-12-28 DIAGNOSIS — E876 Hypokalemia: Secondary | ICD-10-CM | POA: Insufficient documentation

## 2019-12-28 MED ORDER — SODIUM CHLORIDE 0.9% FLUSH
10.0000 mL | Freq: Once | INTRAVENOUS | Status: AC
  Administered 2019-12-28: 10 mL
  Filled 2019-12-28: qty 10

## 2019-12-28 MED ORDER — SODIUM CHLORIDE 0.9% FLUSH
10.0000 mL | INTRAVENOUS | Status: DC | PRN
  Administered 2019-12-28: 10 mL
  Filled 2019-12-28: qty 10

## 2019-12-28 MED ORDER — SODIUM CHLORIDE 0.9 % IV SOLN
Freq: Once | INTRAVENOUS | Status: AC
  Filled 2019-12-28: qty 250

## 2019-12-28 MED ORDER — HEPARIN SOD (PORK) LOCK FLUSH 100 UNIT/ML IV SOLN
500.0000 [IU] | Freq: Once | INTRAVENOUS | Status: AC | PRN
  Administered 2019-12-28: 12:00:00 500 [IU]
  Filled 2019-12-28: qty 5

## 2019-12-28 MED ORDER — SODIUM CHLORIDE 0.9 % IV SOLN
200.0000 mg | Freq: Once | INTRAVENOUS | Status: AC
  Administered 2019-12-28: 200 mg via INTRAVENOUS
  Filled 2019-12-28: qty 8

## 2019-12-28 MED ORDER — GABAPENTIN 100 MG PO CAPS: 100.0000 mg | ORAL_CAPSULE | Freq: Three times a day (TID) | ORAL | 1 refills | Status: DC

## 2019-12-28 NOTE — Patient Instructions (Signed)
[facility] Discharge Instructions for Patients Receiving Chemotherapy  Today you received the following chemotherapy agents:  Keytruda.  To help prevent nausea and vomiting after your treatment, we encourage you to take your nausea medication as directed.   If you develop nausea and vomiting that is not controlled by your nausea medication, call the clinic.   BELOW ARE SYMPTOMS THAT SHOULD BE REPORTED IMMEDIATELY:  *FEVER GREATER THAN 100.5 F  *CHILLS WITH OR WITHOUT FEVER  NAUSEA AND VOMITING THAT IS NOT CONTROLLED WITH YOUR NAUSEA MEDICATION  *UNUSUAL SHORTNESS OF BREATH  *UNUSUAL BRUISING OR BLEEDING  TENDERNESS IN MOUTH AND THROAT WITH OR WITHOUT PRESENCE OF ULCERS  *URINARY PROBLEMS  *BOWEL PROBLEMS  UNUSUAL RASH Items with * indicate a potential emergency and should be followed up as soon as possible.  Feel free to call the clinic should you have any questions or concerns. The clinic phone number is [phone].  Please show the CHEMO ALERT CARD at check-in to the Emergency Department and triage nurse.    

## 2019-12-28 NOTE — Progress Notes (Signed)
Per [name], [facility] to treat using labs from 12/07/2019

## 2019-12-29 ENCOUNTER — Telehealth: Payer: Self-pay | Admitting: Hematology

## 2019-12-29 NOTE — Telephone Encounter (Signed)
Scheduled appt per 4/14 los.  Left a vm of the next scheduled appt date and time.

## 2020-01-02 ENCOUNTER — Ambulatory Visit (INDEPENDENT_AMBULATORY_CARE_PROVIDER_SITE_OTHER): Payer: Medicare Other | Admitting: Physician Assistant

## 2020-01-02 ENCOUNTER — Encounter (INDEPENDENT_AMBULATORY_CARE_PROVIDER_SITE_OTHER): Payer: Self-pay | Admitting: Physician Assistant

## 2020-01-02 ENCOUNTER — Other Ambulatory Visit: Payer: Self-pay | Admitting: Hematology

## 2020-01-02 ENCOUNTER — Other Ambulatory Visit: Payer: Self-pay

## 2020-01-02 ENCOUNTER — Telehealth: Payer: Self-pay

## 2020-01-02 VITALS — BP 124/84 | HR 73 | Temp 98.1°F | Ht 63.0 in | Wt 179.0 lb

## 2020-01-02 DIAGNOSIS — Z6831 Body mass index (BMI) 31.0-31.9, adult: Secondary | ICD-10-CM

## 2020-01-02 DIAGNOSIS — E669 Obesity, unspecified: Secondary | ICD-10-CM | POA: Diagnosis not present

## 2020-01-02 DIAGNOSIS — C50211 Malignant neoplasm of upper-inner quadrant of right female breast: Secondary | ICD-10-CM

## 2020-01-02 DIAGNOSIS — E7849 Other hyperlipidemia: Secondary | ICD-10-CM

## 2020-01-02 DIAGNOSIS — Z170 Estrogen receptor positive status [ER+]: Secondary | ICD-10-CM

## 2020-01-02 NOTE — Progress Notes (Signed)
Chief Complaint:   OBESITY Dawn [name] is here to discuss her progress with her obesity treatment plan along with follow-up of her obesity related diagnoses. Dawn [name] is keeping a food journal and adhering to recommended goals of 1200 calories and 80 grams of protein and states she is following her eating plan approximately 75-80% of the time. Dawn [name] states she is walking 2-3.5 miles 45 minutes 7 times per week.  Today's visit was #: 7 Starting weight: 190 lbs Starting date: 10/03/2019 Today's weight: 179 lbs Today's date: 01/02/2020 Total lbs lost to date: 11 Total lbs lost since last in-office visit: 1  Interim History: Dawn [name] states that she is tired of eating all the meat and sandwich meat on the plan and is looking for more variety. More recipes and the Pescatarian plan were reviewed today.  Subjective:   Other hyperlipidemia. Brooxie is on no medications. No chest pain. She is exercising often.   Lab Results  Component Value Date   CHOL 236 (H) 10/03/2019   HDL 54 10/03/2019   LDLCALC 157 (H) 10/03/2019   TRIG 137 10/03/2019   CHOLHDL 5.0 02/09/2019   Lab Results  Component Value Date   ALT 68 (H) 12/07/2019   AST 45 (H) 12/07/2019   ALKPHOS 39 12/07/2019   BILITOT 0.6 12/07/2019   The 10-year ASCVD risk score [name], et al., 2013) is: 17.3%   Values used to calculate the score:     Age: 49 years     Sex: Female     Is Non-Hispanic African American: No     Diabetic: Yes     Tobacco smoker: No     Systolic Blood Pressure: [id number] mmHg     Is BP treated: Yes     HDL Cholesterol: 54 mg/dL     Total Cholesterol: 236 mg/dL  Assessment/Plan:   Other hyperlipidemia. Cardiovascular risk and specific lipid/LDL goals reviewed.  We discussed several lifestyle modifications today and Dawn [name] will continue to work on diet, exercise and weight loss efforts. Orders and follow up as documented in patient record.   Counseling Intensive lifestyle modifications are the  first line treatment for this issue. . Dietary changes: Increase soluble fiber. Decrease simple carbohydrates. . Exercise changes: Moderate to vigorous-intensity aerobic activity 150 minutes per week if tolerated. . Lipid-lowering medications: see documented in medical record.  Class 1 obesity with serious comorbidity and body mass index (BMI) of 31.0 to 31.9 in adult, unspecified obesity type.  Dawn [name] is currently in the action stage of change. As such, her goal is to continue with weight loss efforts. She has agreed to keeping a food journal and adhering to recommended goals of 1200 calories and 80 grams of protein daily.   Exercise goals: Older adults should follow the adult guidelines. When older adults cannot meet the adult guidelines, they should be as physically active as their abilities and conditions will allow.   Behavioral modification strategies: meal planning and cooking strategies and keeping healthy foods in the home.  Dawn [name] has agreed to follow-up with our clinic in 3 weeks. She was informed of the importance of frequent follow-up visits to maximize her success with intensive lifestyle modifications for her multiple health conditions.   Objective:   Blood pressure 124/84, pulse 73, temperature 98.1 F (36.7 C), temperature source Oral, height 5\' 3"  (1.6 m), weight 179 lb (81.2 kg), SpO2 95 %. Body mass index is 31.71 kg/m.  General: Cooperative, alert, well developed, in no acute  distress. HEENT: Conjunctivae and lids unremarkable. Cardiovascular: Regular rhythm.  Lungs: Normal work of breathing. Neurologic: No focal deficits.   Lab Results  Component Value Date   CREATININE 0.79 12/07/2019   BUN 13 12/07/2019   NA 139 12/07/2019   K 3.6 12/07/2019   CL 102 12/07/2019   CO2 28 12/07/2019   Lab Results  Component Value Date   ALT 68 (H) 12/07/2019   AST 45 (H) 12/07/2019   ALKPHOS 39 12/07/2019   BILITOT 0.6 12/07/2019   Lab Results  Component Value Date    HGBA1C 5.4 10/03/2019   HGBA1C 5.4 02/09/2019   HGBA1C 5.5 06/07/2018   HGBA1C 5.2 11/19/2017   HGBA1C 5.2 06/04/2017   Lab Results  Component Value Date   INSULIN 24.0 10/03/2019   Lab Results  Component Value Date   TSH 1.851 10/24/2019   Lab Results  Component Value Date   CHOL 236 (H) 10/03/2019   HDL 54 10/03/2019   LDLCALC 157 (H) 10/03/2019   TRIG 137 10/03/2019   CHOLHDL 5.0 02/09/2019   Lab Results  Component Value Date   WBC 4.1 12/07/2019   HGB 14.0 12/07/2019   HCT 40.1 12/07/2019   MCV 100.0 12/07/2019   PLT 160 12/07/2019   Lab Results  Component Value Date   FERRITIN 176 12/17/2012   Obesity Behavioral Intervention Documentation for Insurance:   Approximately 15 minutes were spent on the discussion below.  ASK: We discussed the diagnosis of obesity with Dawn [name] today and Dawn [name] agreed to give [name] permission to discuss obesity behavioral modification therapy today.  ASSESS: Dawn [name] has the diagnosis of obesity and her BMI today is 31.7. Dawn [name] is in the action stage of change.   ADVISE: Dawn [name] was educated on the multiple health risks of obesity as well as the benefit of weight loss to improve her health. She was advised of the need for long term treatment and the importance of lifestyle modifications to improve her current health and to decrease her risk of future health problems.  AGREE: Multiple dietary modification options and treatment options were discussed and Dawn [name] agreed to follow the recommendations documented in the above note.  ARRANGE: Dawn [name] was educated on the importance of frequent visits to treat obesity as outlined per CMS and USPSTF guidelines and agreed to schedule her next follow up appointment today.  Attestation Statements:   Reviewed by clinician on day of visit: allergies, medications, problem list, medical history, surgical history, family history, social history, and previous encounter notes.  IMichaelene Song, am acting  as transcriptionist for [name], PA-C   I have reviewed the above documentation for accuracy and completeness, and I agree with the above. [name], PA-C

## 2020-01-02 NOTE — Telephone Encounter (Signed)
Sure, I will order diagnostic mammogram and right breast US, thanks   Truitt Merle MD

## 2020-01-02 NOTE — Telephone Encounter (Signed)
Ms Einspahr called stating that she has a painful lump on her right breast.  This is in the same location as the cyst she had drained in 04/2019.  She has scheduled a mammogram at [facility] on 01/25/2020 and is requesting an order.

## 2020-01-04 NOTE — Telephone Encounter (Signed)
Orders for mammogram and breast ultrasound faxed to [place]. Ms Viscusi notified and verbalized understanding

## 2020-01-12 NOTE — Progress Notes (Signed)
Pharmacist Chemotherapy Monitoring - Follow Up Assessment    I verify that I have reviewed each item in the below checklist:  . Regimen for the patient is scheduled for the appropriate day and plan matches scheduled date. Marland Kitchen Appropriate non-routine labs are ordered dependent on drug ordered. . If applicable, additional medications reviewed and ordered per protocol based on lifetime cumulative doses and/or treatment regimen.   Plan for follow-up and/or issues identified: No . I-vent associated with next due treatment: No . MD and/or nursing notified: No   Kennith Center, Pharm.D., CPP 01/12/2020@12 :47 PM

## 2020-01-18 ENCOUNTER — Inpatient Hospital Stay: Payer: Medicare Other

## 2020-01-18 ENCOUNTER — Other Ambulatory Visit: Payer: Self-pay | Admitting: Medical

## 2020-01-18 ENCOUNTER — Other Ambulatory Visit: Payer: Self-pay

## 2020-01-18 ENCOUNTER — Inpatient Hospital Stay (HOSPITAL_BASED_OUTPATIENT_CLINIC_OR_DEPARTMENT_OTHER): Payer: Medicare Other | Admitting: Medical

## 2020-01-18 ENCOUNTER — Inpatient Hospital Stay: Payer: Medicare Other | Attending: Hematology

## 2020-01-18 VITALS — BP 128/73 | HR 69 | Temp 97.8°F | Resp 18

## 2020-01-18 DIAGNOSIS — C541 Malignant neoplasm of endometrium: Secondary | ICD-10-CM | POA: Diagnosis not present

## 2020-01-18 DIAGNOSIS — Z79811 Long term (current) use of aromatase inhibitors: Secondary | ICD-10-CM | POA: Insufficient documentation

## 2020-01-18 DIAGNOSIS — Z170 Estrogen receptor positive status [ER+]: Secondary | ICD-10-CM | POA: Insufficient documentation

## 2020-01-18 DIAGNOSIS — T451X5A Adverse effect of antineoplastic and immunosuppressive drugs, initial encounter: Secondary | ICD-10-CM | POA: Insufficient documentation

## 2020-01-18 DIAGNOSIS — G620 Drug-induced polyneuropathy: Secondary | ICD-10-CM | POA: Diagnosis not present

## 2020-01-18 DIAGNOSIS — C772 Secondary and unspecified malignant neoplasm of intra-abdominal lymph nodes: Secondary | ICD-10-CM | POA: Diagnosis not present

## 2020-01-18 DIAGNOSIS — Z5112 Encounter for antineoplastic immunotherapy: Secondary | ICD-10-CM | POA: Insufficient documentation

## 2020-01-18 DIAGNOSIS — Z79899 Other long term (current) drug therapy: Secondary | ICD-10-CM | POA: Diagnosis not present

## 2020-01-18 DIAGNOSIS — L989 Disorder of the skin and subcutaneous tissue, unspecified: Secondary | ICD-10-CM | POA: Diagnosis not present

## 2020-01-18 DIAGNOSIS — C50211 Malignant neoplasm of upper-inner quadrant of right female breast: Secondary | ICD-10-CM | POA: Insufficient documentation

## 2020-01-18 DIAGNOSIS — Z95828 Presence of other vascular implants and grafts: Secondary | ICD-10-CM

## 2020-01-18 LAB — CBC WITH DIFFERENTIAL/PLATELET
Abs Immature Granulocytes: 0.01 10*3/uL (ref 0.00–0.07)
Basophils Absolute: 0 10*3/uL (ref 0.0–0.1)
Basophils Relative: 1 %
Eosinophils Absolute: 0.1 10*3/uL (ref 0.0–0.5)
Eosinophils Relative: 3 %
HCT: 39.7 % (ref 36.0–46.0)
Hemoglobin: 13.8 g/dL (ref 12.0–15.0)
Immature Granulocytes: 0 %
Lymphocytes Relative: 45 %
Lymphs Abs: 1.9 10*3/uL (ref 0.7–4.0)
MCH: 34.8 pg — ABNORMAL HIGH (ref 26.0–34.0)
MCHC: 34.8 g/dL (ref 30.0–36.0)
MCV: 100.3 fL — ABNORMAL HIGH (ref 80.0–100.0)
Monocytes Absolute: 0.5 10*3/uL (ref 0.1–1.0)
Monocytes Relative: 11 %
Neutro Abs: 1.7 10*3/uL (ref 1.7–7.7)
Neutrophils Relative %: 40 %
Platelets: 156 10*3/uL (ref 150–400)
RBC: 3.96 MIL/uL (ref 3.87–5.11)
RDW: 12.1 % (ref 11.5–15.5)
WBC: 4.3 10*3/uL (ref 4.0–10.5)
nRBC: 0 % (ref 0.0–0.2)

## 2020-01-18 LAB — COMPREHENSIVE METABOLIC PANEL
ALT: 59 U/L — ABNORMAL HIGH (ref 0–44)
AST: 39 U/L (ref 15–41)
Albumin: 3.8 g/dL (ref 3.5–5.0)
Alkaline Phosphatase: 45 U/L (ref 38–126)
Anion gap: 7 (ref 5–15)
BUN: 11 mg/dL (ref 8–23)
CO2: 29 mmol/L (ref 22–32)
Calcium: 9.2 mg/dL (ref 8.9–10.3)
Chloride: 103 mmol/L (ref 98–111)
Creatinine, Ser: 0.76 mg/dL (ref 0.44–1.00)
GFR calc Af Amer: >60 mL/min (ref >60)
GFR calc non Af Amer: >60 mL/min (ref >60)
Glucose, Bld: 97 mg/dL (ref 70–99)
Potassium: 3.7 mmol/L (ref 3.5–5.1)
Sodium: 139 mmol/L (ref 135–145)
Total Bilirubin: 0.6 mg/dL (ref 0.3–1.2)
Total Protein: 7.3 g/dL (ref 6.5–8.1)

## 2020-01-18 LAB — TSH: TSH: 1.595 u[IU]/mL (ref 0.308–3.960)

## 2020-01-18 MED ORDER — HEPARIN SOD (PORK) LOCK FLUSH 100 UNIT/ML IV SOLN
500.0000 [IU] | Freq: Once | INTRAVENOUS | Status: AC | PRN
  Administered 2020-01-18: 11:00:00 500 [IU]
  Filled 2020-01-18: qty 5

## 2020-01-18 MED ORDER — SODIUM CHLORIDE 0.9 % IV SOLN
200.0000 mg | Freq: Once | INTRAVENOUS | Status: AC
  Administered 2020-01-18: 200 mg via INTRAVENOUS
  Filled 2020-01-18: qty 8

## 2020-01-18 MED ORDER — SODIUM CHLORIDE 0.9% FLUSH
10.0000 mL | Freq: Once | INTRAVENOUS | Status: AC
  Administered 2020-01-18: 10 mL
  Filled 2020-01-18: qty 10

## 2020-01-18 MED ORDER — SODIUM CHLORIDE 0.9% FLUSH
10.0000 mL | INTRAVENOUS | Status: DC | PRN
  Administered 2020-01-18: 10 mL
  Filled 2020-01-18: qty 10

## 2020-01-18 MED ORDER — TRIAMCINOLONE ACETONIDE 0.1 % EX LOTN: 1.0000 "application " | TOPICAL_LOTION | Freq: Three times a day (TID) | CUTANEOUS | 2 refills | Status: DC

## 2020-01-18 MED ORDER — SODIUM CHLORIDE 0.9 % IV SOLN
Freq: Once | INTRAVENOUS | Status: AC
  Filled 2020-01-18: qty 250

## 2020-01-18 MED FILL — TRIAMCINOLONE ACETONIDE 0.1: 0.1 | 20 days supply | Qty: 180 | Fill #0

## 2020-01-18 NOTE — Progress Notes (Signed)
The patient was seen in the infusion room today.  She has noted multiple small superficial erythematous and mildly scaling lesions over her upper extremities and neck.  She believes that these are related to her treatment with [facility].  She was given triamcinolone lotion with instructions to use this 3 times daily as needed.  Sandi Mealy, MHS, PA-C Physician Assistant

## 2020-01-18 NOTE — Patient Instructions (Signed)
[facility] Discharge Instructions for Patients Receiving Chemotherapy  Today you received the following chemotherapy agents:  Keytruda.  To help prevent nausea and vomiting after your treatment, we encourage you to take your nausea medication as directed.   If you develop nausea and vomiting that is not controlled by your nausea medication, call the clinic.   BELOW ARE SYMPTOMS THAT SHOULD BE REPORTED IMMEDIATELY:  *FEVER GREATER THAN 100.5 F  *CHILLS WITH OR WITHOUT FEVER  NAUSEA AND VOMITING THAT IS NOT CONTROLLED WITH YOUR NAUSEA MEDICATION  *UNUSUAL SHORTNESS OF BREATH  *UNUSUAL BRUISING OR BLEEDING  TENDERNESS IN MOUTH AND THROAT WITH OR WITHOUT PRESENCE OF ULCERS  *URINARY PROBLEMS  *BOWEL PROBLEMS  UNUSUAL RASH Items with * indicate a potential emergency and should be followed up as soon as possible.  Feel free to call the clinic should you have any questions or concerns. The clinic phone number is [phone].  Please show the CHEMO ALERT CARD at check-in to the Emergency Department and triage nurse.    

## 2020-01-23 ENCOUNTER — Ambulatory Visit (INDEPENDENT_AMBULATORY_CARE_PROVIDER_SITE_OTHER): Payer: Medicare Other | Admitting: Physician Assistant

## 2020-01-23 ENCOUNTER — Encounter (INDEPENDENT_AMBULATORY_CARE_PROVIDER_SITE_OTHER): Payer: Self-pay | Admitting: Physician Assistant

## 2020-01-23 ENCOUNTER — Other Ambulatory Visit: Payer: Self-pay

## 2020-01-23 VITALS — BP 105/70 | HR 87 | Temp 98.1°F | Ht 63.0 in | Wt 178.0 lb

## 2020-01-23 DIAGNOSIS — Z6831 Body mass index (BMI) 31.0-31.9, adult: Secondary | ICD-10-CM

## 2020-01-23 DIAGNOSIS — E559 Vitamin D deficiency, unspecified: Secondary | ICD-10-CM

## 2020-01-23 DIAGNOSIS — E669 Obesity, unspecified: Secondary | ICD-10-CM | POA: Diagnosis not present

## 2020-01-23 DIAGNOSIS — E7849 Other hyperlipidemia: Secondary | ICD-10-CM

## 2020-01-24 NOTE — Progress Notes (Signed)
Chief Complaint:   OBESITY Dawn [name] is here to discuss her progress with her obesity treatment plan along with follow-up of her obesity related diagnoses. Dawn [name] is keeping a food journal and adhering to recommended goals of 1200 calories and 80 grams of protein and states she is following her eating plan approximately 80% of the time. Dawn [name] states she is walking 2.5 miles 5 times per week.  Today's visit was #: 8 Starting weight: 190 lbs Starting date: 10/03/2019 Today's weight: 178 lbs Today's date: 01/23/2020 Total lbs lost to date: 12 Total lbs lost since last in-office visit: 1  Interim History: Dawn [name] reports that she had a birthday, anniversary, and a vacation and did well overall with portion control. She is finding that she is hungry between 4:00-5:00 p.m.  Subjective:   Vitamin D deficiency. Dawn [name] is on OTC Vitamin D. No nausea, vomiting, or muscle weakness. Last Vitamin D 47.80 on 12/07/2019.  Other hyperlipidemia. Dawn [name] is on no medications. No chest pain.   Lab Results  Component Value Date   CHOL 236 (H) 10/03/2019   HDL 54 10/03/2019   LDLCALC 157 (H) 10/03/2019   TRIG 137 10/03/2019   CHOLHDL 5.0 02/09/2019   Lab Results  Component Value Date   ALT 59 (H) 01/18/2020   AST 39 01/18/2020   ALKPHOS 45 01/18/2020   BILITOT 0.6 01/18/2020   The 10-year ASCVD risk score Dawn [name]., et al., 2013) is: 14%   Values used to calculate the score:     Age: 69 years     Sex: Female     Is Non-Hispanic African American: No     Diabetic: Yes     Tobacco smoker: No     Systolic Blood Pressure: 123456 mmHg     Is BP treated: Yes     HDL Cholesterol: 54 mg/dL     Total Cholesterol: 236 mg/dL  Assessment/Plan:   Vitamin D deficiency. Low Vitamin D level contributes to fatigue and are associated with obesity, breast, and colon cancer. She agrees to continue to take Vitamin D and will follow-up for routine testing of Vitamin D, at least 2-3 times per year  to avoid over-replacement.  Other hyperlipidemia. Cardiovascular risk and specific lipid/LDL goals reviewed.  We discussed several lifestyle modifications today and Dawn [name] will continue to work on diet, exercise and weight loss efforts. Orders and follow up as documented in patient record.   Counseling Intensive lifestyle modifications are the first line treatment for this issue. . Dietary changes: Increase soluble fiber. Decrease simple carbohydrates. . Exercise changes: Moderate to vigorous-intensity aerobic activity 150 minutes per week if tolerated. . Lipid-lowering medications: see documented in medical record.  Class 1 obesity with serious comorbidity and body mass index (BMI) of 31.0 to 31.9 in adult, unspecified obesity type.  Dawn [name] is currently in the action stage of change. As such, her goal is to continue with weight loss efforts. She has agreed to keeping a food journal and adhering to recommended goals of 1200 calories and 80 grams of protein daily.  Exercise goals: Older adults should follow the adult guidelines. When older adults cannot meet the adult guidelines, they should be as physically active as their abilities and conditions will allow.   Behavioral modification strategies: meal planning and cooking strategies and keeping healthy foods in the home.  Dawn [name] has agreed to follow-up with our clinic in 2-3 weeks. She was informed of the importance of frequent follow-up visits to maximize her  success with intensive lifestyle modifications for her multiple health conditions.   Objective:   Blood pressure 105/70, pulse 87, temperature 98.1 F (36.7 C), temperature source Oral, height 5\' 3"  (1.6 m), weight 178 lb (80.7 kg), SpO2 93 %. Body mass index is 31.53 kg/m.  General: Cooperative, alert, well developed, in no acute distress. HEENT: Conjunctivae and lids unremarkable. Cardiovascular: Regular rhythm.  Lungs: Normal work of breathing. Neurologic: No focal deficits.     Lab Results  Component Value Date   CREATININE 0.76 01/18/2020   BUN 11 01/18/2020   NA 139 01/18/2020   K 3.7 01/18/2020   CL 103 01/18/2020   CO2 29 01/18/2020   Lab Results  Component Value Date   ALT 59 (H) 01/18/2020   AST 39 01/18/2020   ALKPHOS 45 01/18/2020   BILITOT 0.6 01/18/2020   Lab Results  Component Value Date   HGBA1C 5.4 10/03/2019   HGBA1C 5.4 02/09/2019   HGBA1C 5.5 06/07/2018   HGBA1C 5.2 11/19/2017   HGBA1C 5.2 06/04/2017   Lab Results  Component Value Date   INSULIN 24.0 10/03/2019   Lab Results  Component Value Date   TSH 1.595 01/18/2020   Lab Results  Component Value Date   CHOL 236 (H) 10/03/2019   HDL 54 10/03/2019   LDLCALC 157 (H) 10/03/2019   TRIG 137 10/03/2019   CHOLHDL 5.0 02/09/2019   Lab Results  Component Value Date   WBC 4.3 01/18/2020   HGB 13.8 01/18/2020   HCT 39.7 01/18/2020   MCV 100.3 (H) 01/18/2020   PLT 156 01/18/2020   Lab Results  Component Value Date   FERRITIN 176 12/17/2012   Obesity Behavioral Intervention Documentation for Insurance:   Approximately 15 minutes were spent on the discussion below.  ASK: We discussed the diagnosis of obesity with Dawn [name] today and Dawn [name] agreed to give [name] permission to discuss obesity behavioral modification therapy today.  ASSESS: Dawn [name] has the diagnosis of obesity and her BMI today is 31.6. Dawn [name] is in the action stage of change.   ADVISE: Dawn [name] was educated on the multiple health risks of obesity as well as the benefit of weight loss to improve her health. She was advised of the need for long term treatment and the importance of lifestyle modifications to improve her current health and to decrease her risk of future health problems.  AGREE: Multiple dietary modification options and treatment options were discussed and Dawn [name] agreed to follow the recommendations documented in the above note.  ARRANGE: Dawn [name] was educated on the importance of frequent visits  to treat obesity as outlined per CMS and USPSTF guidelines and agreed to schedule her next follow up appointment today.  Attestation Statements:   Reviewed by clinician on day of visit: allergies, medications, problem list, medical history, surgical history, family history, social history, and previous encounter notes.  IMichaelene Song, am acting as transcriptionist for [name], PA-C   I have reviewed the above documentation for accuracy and completeness, and I agree with the above. [name], PA-C

## 2020-01-25 ENCOUNTER — Encounter: Payer: Self-pay | Admitting: Hematology

## 2020-01-25 DIAGNOSIS — N6312 Unspecified lump in the right breast, upper inner quadrant: Secondary | ICD-10-CM | POA: Diagnosis not present

## 2020-01-25 DIAGNOSIS — R922 Inconclusive mammogram: Secondary | ICD-10-CM | POA: Diagnosis not present

## 2020-01-25 DIAGNOSIS — Z853 Personal history of malignant neoplasm of breast: Secondary | ICD-10-CM | POA: Diagnosis not present

## 2020-02-02 NOTE — Progress Notes (Signed)
Pharmacist Chemotherapy Monitoring - Follow Up Assessment    I verify that I have reviewed each item in the below checklist:  . Regimen for the patient is scheduled for the appropriate day and plan matches scheduled date. Marland Kitchen Appropriate non-routine labs are ordered dependent on drug ordered. . If applicable, additional medications reviewed and ordered per protocol based on lifetime cumulative doses and/or treatment regimen.   Plan for follow-up and/or issues identified: No . I-vent associated with next due treatment: No . MD and/or nursing notified: No  Britt Boozer 02/02/2020 11:28 AM

## 2020-02-08 ENCOUNTER — Inpatient Hospital Stay: Payer: Medicare Other

## 2020-02-08 ENCOUNTER — Other Ambulatory Visit: Payer: Self-pay

## 2020-02-08 VITALS — BP 122/77 | HR 75 | Temp 97.7°F | Resp 18

## 2020-02-08 DIAGNOSIS — Z170 Estrogen receptor positive status [ER+]: Secondary | ICD-10-CM | POA: Diagnosis not present

## 2020-02-08 DIAGNOSIS — C541 Malignant neoplasm of endometrium: Secondary | ICD-10-CM | POA: Diagnosis not present

## 2020-02-08 DIAGNOSIS — C772 Secondary and unspecified malignant neoplasm of intra-abdominal lymph nodes: Secondary | ICD-10-CM | POA: Diagnosis not present

## 2020-02-08 DIAGNOSIS — L989 Disorder of the skin and subcutaneous tissue, unspecified: Secondary | ICD-10-CM | POA: Diagnosis not present

## 2020-02-08 DIAGNOSIS — Z5112 Encounter for antineoplastic immunotherapy: Secondary | ICD-10-CM | POA: Diagnosis not present

## 2020-02-08 DIAGNOSIS — C50211 Malignant neoplasm of upper-inner quadrant of right female breast: Secondary | ICD-10-CM | POA: Diagnosis not present

## 2020-02-08 MED ORDER — SODIUM CHLORIDE 0.9% FLUSH
10.0000 mL | INTRAVENOUS | Status: DC | PRN
  Administered 2020-02-08: 10 mL
  Filled 2020-02-08: qty 10

## 2020-02-08 MED ORDER — HEPARIN SOD (PORK) LOCK FLUSH 100 UNIT/ML IV SOLN
500.0000 [IU] | Freq: Once | INTRAVENOUS | Status: AC | PRN
  Administered 2020-02-08: 500 [IU]
  Filled 2020-02-08: qty 5

## 2020-02-08 MED ORDER — SODIUM CHLORIDE 0.9 % IV SOLN
200.0000 mg | Freq: Once | INTRAVENOUS | Status: AC
  Administered 2020-02-08: 200 mg via INTRAVENOUS
  Filled 2020-02-08: qty 8

## 2020-02-08 MED ORDER — SODIUM CHLORIDE 0.9 % IV SOLN
Freq: Once | INTRAVENOUS | Status: AC
  Filled 2020-02-08: qty 250

## 2020-02-08 NOTE — Progress Notes (Signed)
Per Dr Burr Medico, ok to treat today without current labs.

## 2020-02-08 NOTE — Patient Instructions (Signed)
[facility] Discharge Instructions for Patients Receiving Chemotherapy  Today you received the following chemotherapy agents: pembrolizumab.  To help prevent nausea and vomiting after your treatment, we encourage you to take your nausea medication as directed.   If you develop nausea and vomiting that is not controlled by your nausea medication, call the clinic.   BELOW ARE SYMPTOMS THAT SHOULD BE REPORTED IMMEDIATELY:  *FEVER GREATER THAN 100.5 F  *CHILLS WITH OR WITHOUT FEVER  NAUSEA AND VOMITING THAT IS NOT CONTROLLED WITH YOUR NAUSEA MEDICATION  *UNUSUAL SHORTNESS OF BREATH  *UNUSUAL BRUISING OR BLEEDING  TENDERNESS IN MOUTH AND THROAT WITH OR WITHOUT PRESENCE OF ULCERS  *URINARY PROBLEMS  *BOWEL PROBLEMS  UNUSUAL RASH Items with * indicate a potential emergency and should be followed up as soon as possible.  Feel free to call the clinic should you have any questions or concerns. The clinic phone number is [phone].  Please show the CHEMO ALERT CARD at check-in to the Emergency Department and triage nurse.   

## 2020-02-20 ENCOUNTER — Ambulatory Visit (INDEPENDENT_AMBULATORY_CARE_PROVIDER_SITE_OTHER): Payer: Medicare Other | Admitting: Physician Assistant

## 2020-02-20 ENCOUNTER — Other Ambulatory Visit: Payer: Self-pay

## 2020-02-20 ENCOUNTER — Encounter (INDEPENDENT_AMBULATORY_CARE_PROVIDER_SITE_OTHER): Payer: Self-pay | Admitting: Physician Assistant

## 2020-02-20 VITALS — BP 121/75 | HR 77 | Temp 98.2°F | Ht 63.0 in | Wt 176.0 lb

## 2020-02-20 DIAGNOSIS — E669 Obesity, unspecified: Secondary | ICD-10-CM | POA: Diagnosis not present

## 2020-02-20 DIAGNOSIS — E038 Other specified hypothyroidism: Secondary | ICD-10-CM | POA: Diagnosis not present

## 2020-02-20 DIAGNOSIS — I10 Essential (primary) hypertension: Secondary | ICD-10-CM

## 2020-02-20 DIAGNOSIS — Z6831 Body mass index (BMI) 31.0-31.9, adult: Secondary | ICD-10-CM

## 2020-02-20 NOTE — Progress Notes (Signed)
Chief Complaint:   OBESITY Dawn [name] is here to discuss her progress with her obesity treatment plan along with follow-up of her obesity related diagnoses. Aariana is keeping a food journal and adhering to recommended goals of 1200 calories and 80 grams of protein and states she is following her eating plan approximately 80% of the time. Leslye states she is walking 45 minutes 5-7 times per week.  Today's visit was #: 9 Starting weight: 190 lbs Starting date: 10/03/2019 Today's weight: 176 lbs Today's date: 02/20/2020 Total lbs lost to date: 14 Total lbs lost since last in-office visit: 2  Interim History: Doralyn states that her husband is getting tired of "her diet" and she is looking for more summer recipes to keep things interesting.  Subjective:   Essential hypertension. Caffie is on Benicar HCT. Blood pressure is normal. No headache or chest pain.  BP Readings from Last 3 Encounters:  02/20/20 121/75  02/08/20 122/77  01/23/20 105/70   Lab Results  Component Value Date   CREATININE 0.76 01/18/2020   CREATININE 0.79 12/07/2019   CREATININE 0.83 10/24/2019   Other specified hypothyroidism. Candia is on levothyroxine. No excessive fatigue.  Lab Results  Component Value Date   TSH 1.595 01/18/2020   Assessment/Plan:   Essential hypertension. Jassica is working on healthy weight loss and exercise to improve blood pressure control. We will watch for signs of hypotension as she continues her lifestyle modifications. She will continue her medication as directed.   Other specified hypothyroidism. Patient with long-standing hypothyroidism, on levothyroxine therapy. She appears euthyroid. Orders and follow up as documented in patient record. Jaryn will continue with levothyroxine as directed.  Counseling . Good thyroid control is important for overall health. Supratherapeutic thyroid levels are dangerous and will not improve weight loss results. . The correct way to  take levothyroxine is fasting, with water, separated by at least 30 minutes from breakfast, and separated by more than 4 hours from calcium, iron, multivitamins, acid reflux medications (PPIs).   Class 1 obesity with serious comorbidity and body mass index (BMI) of 31.0 to 31.9 in adult, unspecified obesity type.  Elle is currently in the action stage of change. As such, her goal is to continue with weight loss efforts. She has agreed to keeping a food journal and adhering to recommended goals of 1200 calories and 80 grams of protein.   Exercise goals: Older adults should follow the adult guidelines. When older adults cannot meet the adult guidelines, they should be as physically active as their abilities and conditions will allow.   Behavioral modification strategies: meal planning and cooking strategies and keeping healthy foods in the home.  Amaiya has agreed to follow-up with our clinic in 3-4 weeks. She was informed of the importance of frequent follow-up visits to maximize her success with intensive lifestyle modifications for her multiple health conditions.   Objective:   Blood pressure 121/75, pulse 77, temperature 98.2 F (36.8 C), temperature source Oral, height 5\' 3"  (1.6 m), weight 176 lb (79.8 kg), SpO2 95 %. Body mass index is 31.18 kg/m.  General: Cooperative, alert, well developed, in no acute distress. HEENT: Conjunctivae and lids unremarkable. Cardiovascular: Regular rhythm.  Lungs: Normal work of breathing. Neurologic: No focal deficits.   Lab Results  Component Value Date   CREATININE 0.76 01/18/2020   BUN 11 01/18/2020   NA 139 01/18/2020   K 3.7 01/18/2020   CL 103 01/18/2020   CO2 29 01/18/2020   Lab  Results  Component Value Date   ALT 59 (H) 01/18/2020   AST 39 01/18/2020   ALKPHOS 45 01/18/2020   BILITOT 0.6 01/18/2020   Lab Results  Component Value Date   HGBA1C 5.4 10/03/2019   HGBA1C 5.4 02/09/2019   HGBA1C 5.5 06/07/2018   HGBA1C 5.2  11/19/2017   HGBA1C 5.2 06/04/2017   Lab Results  Component Value Date   INSULIN 24.0 10/03/2019   Lab Results  Component Value Date   TSH 1.595 01/18/2020   Lab Results  Component Value Date   CHOL 236 (H) 10/03/2019   HDL 54 10/03/2019   LDLCALC 157 (H) 10/03/2019   TRIG 137 10/03/2019   CHOLHDL 5.0 02/09/2019   Lab Results  Component Value Date   WBC 4.3 01/18/2020   HGB 13.8 01/18/2020   HCT 39.7 01/18/2020   MCV 100.3 (H) 01/18/2020   PLT 156 01/18/2020   Lab Results  Component Value Date   FERRITIN 176 12/17/2012   Obesity Behavioral Intervention Documentation for Insurance:   Approximately 15 minutes were spent on the discussion below.  ASK: We discussed the diagnosis of obesity with [name] today and Kelvin agreed to give [name] permission to discuss obesity behavioral modification therapy today.  ASSESS: Delaney has the diagnosis of obesity and her BMI today is 31.2. Keylin is in the action stage of change.   ADVISE: Icess was educated on the multiple health risks of obesity as well as the benefit of weight loss to improve her health. She was advised of the need for long term treatment and the importance of lifestyle modifications to improve her current health and to decrease her risk of future health problems.  AGREE: Multiple dietary modification options and treatment options were discussed and Shaneen agreed to follow the recommendations documented in the above note.  ARRANGE: Wilder was educated on the importance of frequent visits to treat obesity as outlined per CMS and USPSTF guidelines and agreed to schedule her next follow up appointment today.  Attestation Statements:   Reviewed by clinician on day of visit: allergies, medications, problem list, medical history, surgical history, family history, social history, and previous encounter notes.  IMichaelene Song, am acting as transcriptionist for [name], PA-C   I have reviewed the above  documentation for accuracy and completeness, and I agree with the above. [name], PA-C

## 2020-03-12 ENCOUNTER — Ambulatory Visit (INDEPENDENT_AMBULATORY_CARE_PROVIDER_SITE_OTHER): Payer: Medicare Other | Admitting: Physician Assistant

## 2020-03-12 ENCOUNTER — Encounter (INDEPENDENT_AMBULATORY_CARE_PROVIDER_SITE_OTHER): Payer: Self-pay | Admitting: Physician Assistant

## 2020-03-12 ENCOUNTER — Other Ambulatory Visit: Payer: Self-pay

## 2020-03-12 VITALS — BP 126/81 | HR 78 | Temp 97.8°F | Ht 63.0 in | Wt 175.0 lb

## 2020-03-12 DIAGNOSIS — I10 Essential (primary) hypertension: Secondary | ICD-10-CM | POA: Diagnosis not present

## 2020-03-12 DIAGNOSIS — Z6831 Body mass index (BMI) 31.0-31.9, adult: Secondary | ICD-10-CM | POA: Diagnosis not present

## 2020-03-12 DIAGNOSIS — E669 Obesity, unspecified: Secondary | ICD-10-CM

## 2020-03-12 DIAGNOSIS — E7849 Other hyperlipidemia: Secondary | ICD-10-CM

## 2020-03-12 NOTE — Progress Notes (Signed)
Chief Complaint:   OBESITY MAVERY MILLING is here to discuss her progress with her obesity treatment plan along with follow-up of her obesity related diagnoses. Manie is keeping a food journal and adhering to recommended goals of 1200 calories and 80 grams of protein and states she is following her eating plan approximately 80% of the time. Tarra states she is walking 2-3 miles 7 times per week.  Today's visit was #: 10 Starting weight: 190 lbs Starting date: 10/03/2019 Today's weight: 175 lbs Today's date: 03/12/2020 Total lbs lost to date: 15 Total lbs lost since last in-office visit: 1  Interim History: Cyann states that she has been stressed with preparing for her daughter's wedding in August. She is not journaling consistently.  Subjective:   Other hyperlipidemia. Rudi is on no medication. No chest pain. She is exercising regularly.  Lab Results  Component Value Date   CHOL 236 (H) 10/03/2019   HDL 54 10/03/2019   LDLCALC 157 (H) 10/03/2019   TRIG 137 10/03/2019   CHOLHDL 5.0 02/09/2019   Lab Results  Component Value Date   ALT 59 (H) 01/18/2020   AST 39 01/18/2020   ALKPHOS 45 01/18/2020   BILITOT 0.6 01/18/2020   The 10-year ASCVD risk score [name], et al., 2013) is: 19.5%   Values used to calculate the score:     Age: 11 years     Sex: Female     Is Non-Hispanic African American: No     Diabetic: Yes     Tobacco smoker: No     Systolic Blood Pressure: 353 mmHg     Is BP treated: Yes     HDL Cholesterol: 54 mg/dL     Total Cholesterol: 236 mg/dL  Essential hypertension. Asal is on Benicar. Blood pressure is controlled. No chest pain.  BP Readings from Last 3 Encounters:  03/12/20 126/81  02/20/20 121/75  02/08/20 122/77   Lab Results  Component Value Date   CREATININE 0.76 01/18/2020   CREATININE 0.79 12/07/2019   CREATININE 0.83 10/24/2019   Assessment/Plan:   Other hyperlipidemia. Cardiovascular risk and specific lipid/LDL  goals reviewed.  We discussed several lifestyle modifications today and Jaymes will continue to work on diet, exercise and weight loss efforts. Orders and follow up as documented in patient record.   Counseling Intensive lifestyle modifications are the first line treatment for this issue.  Dietary changes: Increase soluble fiber. Decrease simple carbohydrates.  Exercise changes: Moderate to vigorous-intensity aerobic activity 150 minutes per week if tolerated.  Lipid-lowering medications: see documented in medical record.  Essential hypertension. Cortlyn is working on healthy weight loss and exercise to improve blood pressure control. We will watch for signs of hypotension as she continues her lifestyle modifications. She will continue her medication as directed.   Class 1 obesity with serious comorbidity and body mass index (BMI) of 31.0 to 31.9 in adult, unspecified obesity type.  Tanaysia is currently in the action stage of change. As such, her goal is to continue with weight loss efforts. She has agreed to keeping a food journal and adhering to recommended goals of 1200 calories and 80 grams of protein.   Exercise goals: Older adults should follow the adult guidelines. When older adults cannot meet the adult guidelines, they should be as physically active as their abilities and conditions will allow.   Behavioral modification strategies: meal planning and cooking strategies and keeping healthy foods in the home.  Danyah has agreed to follow-up  with our clinic in 6 weeks. She was informed of the importance of frequent follow-up visits to maximize her success with intensive lifestyle modifications for her multiple health conditions.   Objective:   Blood pressure 126/81, pulse 78, temperature 97.8 F (36.6 C), temperature source Oral, height 5\' 3"  (1.6 m), weight 175 lb (79.4 kg), SpO2 96 %. Body mass index is 31 kg/m.  General: Cooperative, alert, well developed, in no acute  distress. HEENT: Conjunctivae and lids unremarkable. Cardiovascular: Regular rhythm.  Lungs: Normal work of breathing. Neurologic: No focal deficits.   Lab Results  Component Value Date   CREATININE 0.76 01/18/2020   BUN 11 01/18/2020   NA 139 01/18/2020   K 3.7 01/18/2020   CL 103 01/18/2020   CO2 29 01/18/2020   Lab Results  Component Value Date   ALT 59 (H) 01/18/2020   AST 39 01/18/2020   ALKPHOS 45 01/18/2020   BILITOT 0.6 01/18/2020   Lab Results  Component Value Date   HGBA1C 5.4 10/03/2019   HGBA1C 5.4 02/09/2019   HGBA1C 5.5 06/07/2018   HGBA1C 5.2 11/19/2017   HGBA1C 5.2 06/04/2017   Lab Results  Component Value Date   INSULIN 24.0 10/03/2019   Lab Results  Component Value Date   TSH 1.595 01/18/2020   Lab Results  Component Value Date   CHOL 236 (H) 10/03/2019   HDL 54 10/03/2019   LDLCALC 157 (H) 10/03/2019   TRIG 137 10/03/2019   CHOLHDL 5.0 02/09/2019   Lab Results  Component Value Date   WBC 4.3 01/18/2020   HGB 13.8 01/18/2020   HCT 39.7 01/18/2020   MCV 100.3 (H) 01/18/2020   PLT 156 01/18/2020   Lab Results  Component Value Date   FERRITIN 176 12/17/2012   Obesity Behavioral Intervention Documentation for Insurance:   Approximately 15 minutes were spent on the discussion below.  ASK: We discussed the diagnosis of obesity with [name] today and Becka agreed to give [name] permission to discuss obesity behavioral modification therapy today.  ASSESS: Shawnta has the diagnosis of obesity and her BMI today is 31.0. Oneika is in the action stage of change.   ADVISE: Branna was educated on the multiple health risks of obesity as well as the benefit of weight loss to improve her health. She was advised of the need for long term treatment and the importance of lifestyle modifications to improve her current health and to decrease her risk of future health problems.  AGREE: Multiple dietary modification options and treatment options were  discussed and Marsi agreed to follow the recommendations documented in the above note.  ARRANGE: Tuwana was educated on the importance of frequent visits to treat obesity as outlined per CMS and USPSTF guidelines and agreed to schedule her next follow up appointment today.  Attestation Statements:   Reviewed by clinician on day of visit: allergies, medications, problem list, medical history, surgical history, family history, social history, and previous encounter notes.  IMichaelene Song, am acting as transcriptionist for [name], PA-C   I have reviewed the above documentation for accuracy and completeness, and I agree with the above. [name], PA-C

## 2020-03-18 ENCOUNTER — Other Ambulatory Visit: Payer: Self-pay | Admitting: Family Medicine

## 2020-03-18 DIAGNOSIS — M810 Age-related osteoporosis without current pathological fracture: Secondary | ICD-10-CM

## 2020-03-21 ENCOUNTER — Other Ambulatory Visit: Payer: Self-pay

## 2020-03-21 ENCOUNTER — Inpatient Hospital Stay: Payer: Medicare Other | Attending: Hematology

## 2020-03-21 DIAGNOSIS — Z452 Encounter for adjustment and management of vascular access device: Secondary | ICD-10-CM | POA: Diagnosis not present

## 2020-03-21 DIAGNOSIS — C772 Secondary and unspecified malignant neoplasm of intra-abdominal lymph nodes: Secondary | ICD-10-CM | POA: Diagnosis not present

## 2020-03-21 DIAGNOSIS — C541 Malignant neoplasm of endometrium: Secondary | ICD-10-CM | POA: Diagnosis not present

## 2020-03-21 DIAGNOSIS — Z95828 Presence of other vascular implants and grafts: Secondary | ICD-10-CM

## 2020-03-21 MED ORDER — HEPARIN SOD (PORK) LOCK FLUSH 100 UNIT/ML IV SOLN
500.0000 [IU] | Freq: Once | INTRAVENOUS | Status: AC | PRN
  Administered 2020-03-21: 500 [IU] via INTRAVENOUS
  Filled 2020-03-21: qty 5

## 2020-03-21 MED ORDER — SODIUM CHLORIDE 0.9% FLUSH
10.0000 mL | Freq: Once | INTRAVENOUS | Status: AC
  Administered 2020-03-21: 10 mL
  Filled 2020-03-21: qty 10

## 2020-04-19 NOTE — Progress Notes (Signed)
[place]   Telephone:([phone]) [phone] [phone]) [phone]   Clinic Follow up Note   Patient Care Team: [name], MD as PCP - Haskell Riling, MD (Inactive) as Consulting Physician (General Surgery) Truitt Merle, MD as Consulting Physician (Hematology) [name], MD as Attending Physician (Radiation Oncology)  Date of Service:  04/25/2020  CHIEF COMPLAINT: F/u on breast and endometrial cancers  SUMMARY OF ONCOLOGIC HISTORY: Oncology History Overview Note  MSI high Cancer Staging Endometrial cancer [facility]) Staging form: Corpus Uteri - Adenosarcoma, AJCC 8th Edition - Pathologic stage from 06/09/2017: Stage IIIC (pT1a, pN1, cM0) - Signed by Truitt Merle, MD on 06/16/2017  Malignant neoplasm of upper-inner quadrant of right breast in female, estrogen receptor positive ([place]) Staging form: Breast, AJCC 8th Edition - Clinical stage from 05/06/2017: Stage IA (cT1c, cN0, cM0, G2, ER: Positive, PR: Negative, HER2: Positive) - Unsigned - Pathologic stage from 05/25/2017: Stage IIA (pT2, pN0, cM0, G2, ER: Positive, PR: Negative, HER2: Negative) - Signed by Truitt Merle, MD on 06/16/2017     Malignant neoplasm of upper-inner quadrant of right breast in female, estrogen receptor positive ([place])  04/27/2017 Initial Biopsy   Diagnosis Breast, right, needle core biopsy INVASIVE DUCTAL CARCINOMA, GRADE 2 Microscopic Comment The neoplasm has intracellular mucin and signet ring cell features. Immunostains shows these cells are positive for ER,GATA3, ck7 and GCDFP, negative for ck20, cdx2, TTF-1 and pax8, The immunostaining pattern supports the neoplasm is breast primary.   04/27/2017 Receptors her2   Estrogen Receptor: 70%, POSITIVE, MODERATE STAINING INTENSITY Progesterone Receptor: 0%, NEGATIVE Proliferation Marker Ki67: 30% HER2 - **POSITIVE** RATIO OF HER2/CEP17 SIGNALS 2.91 AVERAGE HER2 COPY NUMBER PER CELL 7.28   04/27/2017 Initial Diagnosis   Malignant  neoplasm of upper-inner quadrant of right breast in female, estrogen receptor positive ([place])   05/07/2017 Imaging   CT Chest W Contrast 05/07/17 IMPRESSION: Tiny well-defined fatty lesion on the pleura at the right lung base. The thinner slice collimation used for today's chest CT eliminates volume-averaging seen in the lesion on the prior exam and confirms that this is a diffusely fatty nodule. This is a benign finding and likely represents a tiny lipoma. No defect in the hemidiaphragm evident to suggest tiny diaphragmatic hernia. Pulmonary hamartoma a consideration although the lack of soft tissue components makes this less likely.   05/15/2017 Genetic Testing   Patient had genetic testing due to a personal history of breast cancer and uterine cancer as well as a family history of cancer. The Multi-Cancer panel was ordered. The Multi-Cancer Panel offered by [facility] includes sequencing and/or deletion duplication testing of the following 83 genes: ALK, APC, ATM, AXIN2,BAP1,  BARD1, BLM, BMPR1A, BRCA1, BRCA2, BRIP1, CASR, CDC73, CDH1, CDK4, CDKN1B, CDKN1C, CDKN2A (p14ARF), CDKN2A (p16INK4a), CEBPA, CHEK2, CTNNA1, DICER1, DIS3L2, EGFR (c.2369C>T, p.Thr790Met variant only), EPCAM (Deletion/duplication testing only), FH, FLCN, GATA2, GPC3, GREM1 (Promoter region deletion/duplication testing only), HOXB13 (c.251G>A, p.Gly84Glu), HRAS, KIT, MAX, MEN1, MET, MITF (c.952G>A, p.Glu318Lys variant only), MLH1, MSH2, MSH3, MSH6, MUTYH, NBN, NF1, NF2, NTHL1, PALB2, PDGFRA, PHOX2B, PMS2, POLD1, POLE, POT1, PRKAR1A, PTCH1, PTEN, RAD50, RAD51C, RAD51D, RB1, RECQL4, RET, RUNX1, SDHAF2, SDHA (sequence changes only), SDHB, SDHC, SDHD, SMAD4, SMARCA4, SMARCB1, SMARCE1, STK11, SUFU, TERC, TERT, TMEM127, TP53, TSC1, TSC2, VHL, WRN and WT1.   Results: No pathogenic mutations were identified. A VUS in the GATA2 gene c.1348G>A (p.Gly450Arg) was identified.  The date of this test report is 05/25/2017.     05/25/2017 Surgery     RIGHT BREAST LUMPECTOMY  WITH RADIOACTIVE SEED AND  RIGHT AXILLARY SENTINEL LYMPH NODE BIOPSY and Pot placement by Dr. Excell Seltzer 05/25/17   05/25/2017 Pathology Results   Diagnosis 05/25/17 1. Breast, lumpectomy, right - INVASIVE DUCTAL CARCINOMA, GRADE II/III, SPANNING 2.2 CM. - DUCTAL CARCINOMA IN SITU, HIGH GRADE. - THE SURGICAL RESECTION MARGINS ARE NEGATIVE FOR CARCINOMA. - SEE ONCOLOGY TABLE BELOW. 2. Breast, excision, right additional superior margin - BENIGN FIBROADIPOSE TISSUE. - BENIGN SKELETAL MUSCLE. - SEE COMMENT. 3. Breast, excision, right additional lateral margin - BENIGN BREAST PARENCHYMA. - THERE IS NO EVIDENCE OF MALIGNANCY. - SEE COMMENT. 4. Breast, excision, chest wall margin - BENIGN FIBROADIPOSE TISSUE. - THERE IS NO EVIDENCE OF MALIGNANCY. - SEE COMMENT. 5. Lymph node, sentinel, biopsy, right axillary - THERE IS NO EVIDENCE OF CARCINOMA IN 1 OF 1 LYMPH NODE (0/1). 6. Lymph node, sentinel, biopsy, right axillary - THERE IS NO EVIDENCE OF CARCINOMA IN 1 OF 1 LYMPH NODE (0/1). 7. Lymph node, sentinel, biopsy, right axillary - THERE IS NO EVIDENCE OF CARCINOMA IN 1 OF 1 LYMPH NODE (0/1).   06/26/2017 PET scan   PET  IMPRESSION: 1. Postoperative findings both in the right breast and in the anatomic pelvis, with associated low-grade activity considered to be postoperative in nature. No hypermetabolic adenopathy or hypermetabolic lesions are identified to suggest active metastatic disease/malignancy. 2. Other imaging findings of potential clinical significance: Aortic Atherosclerosis (ICD10-I70.0). Sigmoid colon diverticulosis. Biapical pleuroparenchymal scarring in the lungs. Small amount of free pelvic fluid in the cul-de-sac, likely postoperative.    07/01/2017 - 10/14/2017 Chemotherapy   Adjuvant TCH with Onpro every 3 weeks for 6 cycles starting on 07/01/17, Changed Taxol to abraxane with cycle 4 due to drug rash reaction, followed by Herceptin every 3  weeks for 6 months    11/01/2017 - 12/08/2017 Radiation Therapy   Radiation therapy to her right breast with [name]   11/04/2017 - 06/2018 Chemotherapy   Maintenance Herceptin starting 11/04/17 and will comeplete her 12 months in 06/2018    12/23/2017 -  Anti-estrogen oral therapy   Adjuvant letrozole 2.5 mg daily started 12/23/17, switched to exemestane on 04/21/18 due to joint pain. Could not afford aromasin, started anastrozole 04/2018.    12/30/2017 Imaging   CT CAP W Contrast 12/30/17 IMPRESSION: Increased size of 1.1 cm aorto-caval retroperitoneal lymph node. This could be reactive due to interval hysterectomy, however metastatic carcinoma cannot be excluded. Consider continued attention on short-term follow-up CT, or PET-CT scan for further evaluation.  Mild hepatic steatosis.   01/25/2018 PET scan   IMPRESSION: 1. Enlarging hypermetabolic aortocaval lymph node is worrisome for metastatic disease. 2. Probable postoperative seroma in the medial right breast, with associated mild inflammatory hypermetabolism.   02/17/2018 - 02/08/2020 Antibody Plan   Keytruda every 3 weeks starting 02/17/18. Completed 2 years on 02/08/20   04/28/2018 Mammogram   Probably benign. Calcifications in the right breast most likely are dystrophic, related to previous surgery, and are probably benign.    04/28/2018 Imaging   DEXA Scan T Score -2.0   06/10/2018 Echocardiogram   06/10/2018 ECHO LV EF: 55% -   60%   08/28/2018 - 08/2018 Chemotherapy   Neratinib 53m for 1 week then titrate up with 1 additional tablet weekly up to 12 mg starting 08/28/18. Stopped soon after starting due to diarrhea.   10/25/2018 Imaging   CT CAP 10/25/18  IMPRESSION: 1. No evidence of metastatic disease. 2.  Aortic atherosclerosis (ICD10-170.0).   06/15/2019 Pathology Results   Diagnosis Breast,  right, needle core biopsy, 1 o'clock, 7cmfn - DENSE FIBROSIS CONSISTENT WITH PRIOR PROCEDURAL CHANGES. - NO MALIGNANCY  IDENTIFIED.   10/24/2019 Imaging   CT CAP W Contrast  IMPRESSION: 1. Stable exam. No evidence of recurrent or metastatic carcinoma within the chest, abdomen, or pelvis. 2. Colonic diverticulosis, without radiographic evidence of diverticulitis.   Aortic Atherosclerosis (ICD10-I70.0).   Endometrial adenocarcinoma ([place])  05/07/2017 Initial Diagnosis   Endometrial adenocarcinoma ([place])   06/09/2017 Surgery   XI ROBOTIC ASSISTED TOTAL LAPOROSCOPIC HYSTERECTOMY WITH BILATERAL SALPINGO OOPHORECTOMY and SENTINEL NODE BIOPSY by Dr. Denman George 06/09/17   06/09/2017 Pathology Results   Diagnosis 06/09/17 1. Lymph node, sentinel, biopsy, right external iliac - METASTATIC ADENOCARCINOMA IN ONE LYMPH NODE (1/1). 2. Lymph node, sentinel, biopsy, left obturator - ONE BENIGN LYMPH NODE (0/1). 3. Lymph node, sentinel, biopsy, left external iliac - METASTATIC ADENOCARCINOMA IN ONE LYMPH NODE (1/1). 4. Uterus +/- tubes/ovaries, neoplastic ENDOMETRIUM: - ENDOMETRIAL ADENOCARCINOMA, 3.2 CM. - CARCINOMA INVADES INNER HALF OF MYOMETRIUM. 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 - CERVIX, BILATERAL FALLOPIAN TUBES AND BILATERAL OVARIES FREE OF TUMOR   08/18/2017 - 09/17/2017 Radiation Therapy   vaginal brachytherapy per 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   02/17/2018 -  Chemotherapy   Keytruda every 3 weeks starting 02/17/18   04/11/2019 Imaging   CT AP W Contrast  IMPRESSION: 1. No evidence of recurrent or metastatic carcinoma within the abdomen or pelvis. 2. Colonic diverticulosis. No radiographic evidence of diverticulitis.     04/23/2020 Imaging   CT AP W contrast  IMPRESSION: 1. Stable exam. Specifically, no findings to suggest recurrent or metastatic disease. 2. Left colonic diverticulosis without diverticulitis. 3. Aortic Atherosclerosis (ICD10-I70.0).         CURRENT THERAPY:  -Letrozole 2.75m daily starting 12/23/17. Switched to Anastrozole in 04/2018 due to joint pain  INTERVAL HISTORY:  Dawn MCCAFFREYis here for a follow up and treatment. She presents to the clinic alone. She notes she is doing well. She notes off treatment she feels stable. She notes occasional dizziness. She changed her BP meds in the PM which helped. This does lead to more urination at night. She notes she has not checked her BP when dizzy. She notes when looking up even with sitting, she can feel dizzy. She notes she will having itching of her right breast or axilla. She notes she has been back to work.  She notes she had COVID19 vaccine.    REVIEW OF SYSTEMS:   Constitutional: Denies fevers, chills or abnormal weight loss (+) Occasional dizziness when looking up Eyes: Denies blurriness of vision Ears, nose, mouth, throat, and face: Denies mucositis or sore throat Respiratory: Denies cough, dyspnea or wheezes Cardiovascular: Denies palpitation, chest discomfort or lower extremity swelling Gastrointestinal:  Denies nausea, heartburn or change in bowel habits Skin: Denies abnormal skin rashes Lymphatics: Denies new lymphadenopathy or easy bruising Neurological:Denies numbness, tingling or new weaknesses Behavioral/Psych: Mood is stable, no new changes  All other systems were reviewed with the patient and are negative.  MEDICAL HISTORY:  Past Medical History:  Diagnosis Date   Anemia    as a child.   Arthritis    Bilateral cataracts    Bilateral leg cramps    Cancer (HCC)    skin   Dyspnea    Endometrial cancer (HCC)    Family history of bladder  cancer    Family history of colon cancer in father    Fatty liver    Fuchs' corneal dystrophy    GERD (gastroesophageal reflux disease)    Hearing loss    Right side 30%   Heartburn    History of hiatal hernia    small size   History of radiation therapy 08/21/17, 08/28/17,09/01/17, 09/11/17,  09/17/17   Vaginal cuff brachytherapy.    History of radiation therapy 11/11/17- 12/08/17   Right Breast treated to 40.05 Gy with 15 fx of 2.67 Gy and a boost of 10 Gy with 5 fx.    Hyperlipidemia    Hypertension    Hypothyroidism    IBS (irritable bowel syndrome)    hx of   Malignant neoplasm of upper-inner quadrant of right female breast ([place]) 04/2017   NAFL (nonalcoholic fatty liver)    Obesity    Osteoarthritis    Osteopenia    PONV (postoperative nausea and vomiting)    Tuberculosis    tested positive, mother had when patient was child   Uterine cancer ([place]) 04/2017   endometrial cancer   Varices, gastric    Vertigo     SURGICAL HISTORY: Past Surgical History:  Procedure Laterality Date   BREAST BIOPSY Right 04/27/2017   BREAST LUMPECTOMY WITH RADIOACTIVE SEED AND SENTINEL LYMPH NODE BIOPSY Right 05/25/2017   Procedure: RIGHT BREAST LUMPECTOMY WITH RADIOACTIVE SEED AND  RIGHT AXILLARY SENTINEL LYMPH NODE BIOPSY;  Surgeon: Excell Seltzer, MD;  Location: [place];  Service: General;  Laterality: Right;   CATARACT EXTRACTION, BILATERAL     COLONOSCOPY     HYSTEROSCOPY WITH D & C N/A 04/29/2017   Procedure: DILATATION AND CURETTAGE /HYSTEROSCOPY;  Surgeon: Linda Hedges, DO;  Location: [facility];  Service: Gynecology;  Laterality: N/A;   PORTACATH PLACEMENT Left 05/25/2017   Procedure: INSERTION PORT-A-CATH;  Surgeon: Excell Seltzer, MD;  Location: [place];  Service: General;  Laterality: Left;   ROBOTIC ASSISTED TOTAL HYSTERECTOMY WITH BILATERAL SALPINGO OOPHERECTOMY N/A 06/09/2017   Procedure: XI ROBOTIC ASSISTED TOTAL LAPOROSCOPIC HYSTERECTOMY WITH BILATERAL SALPINGO OOPHORECTOMY;  Surgeon: Everitt Amber, MD;  Location: [facility];  Service: Gynecology;  Laterality: N/A;   SENTINEL NODE BIOPSY N/A 06/09/2017   Procedure: SENTINEL NODE BIOPSY;  Surgeon: Everitt Amber, MD;  Location: [facility];  Service: Gynecology;  Laterality: N/A;     I have reviewed the social history and family history with the patient and they are unchanged from previous note.  ALLERGIES:  is allergic to ciprofloxacin, paclitaxel, crestor [rosuvastatin calcium], fosamax [alendronate sodium], and penicillins.  MEDICATIONS:  Current Outpatient Medications  Medication Sig Dispense Refill   acetaminophen (TYLENOL) 500 MG tablet Take 500 mg by mouth every 6 (six) hours as needed for moderate pain.     anastrozole (ARIMIDEX) 1 MG tablet TAKE 1 TABLET BY MOUTH  DAILY 90 tablet 3   Ascorbic Acid (VITAMIN C) 1000 MG tablet Take 1,000 mg by mouth daily.     b complex vitamins capsule Take 1 capsule by mouth daily.     Cholecalciferol (VITAMIN D3) 75 MCG (3000 UT) TABS Take 1 capsule by mouth daily.     diphenhydrAMINE (BENADRYL) 25 MG tablet Take 25-50 mg by mouth every 6 (six) hours as needed for sleep.     fluticasone (FLONASE) 50 MCG/ACT nasal spray Place 2 sprays into both nostrils daily.     gabapentin (NEURONTIN) 100 MG capsule Take 1 capsule (100 mg total) by mouth 3 (three)  times daily. 90 capsule 1   ibandronate (BONIVA) 150 MG tablet TAKE 1 TABLET BY MOUTH  EVERY 30 DAYS 3 tablet 3   levothyroxine (SYNTHROID) 50 MCG tablet TAKE 1 TABLET BY MOUTH  DAILY 90 tablet 3   lidocaine-prilocaine (EMLA) cream APPLY TO AFFECTED AREA ONCE 30 g 2   Multiple Vitamin (MULTIVITAMIN) capsule Take 1 capsule by mouth daily.       olmesartan-hydrochlorothiazide (BENICAR HCT) 20-12.5 MG tablet TAKE 1 TABLET BY MOUTH  DAILY 90 tablet 3   pantoprazole (PROTONIX) 20 MG tablet Take 1 tablet (20 mg total) by mouth daily. 90 tablet 3   potassium chloride SA (KLOR-CON) 20 MEQ tablet TAKE 1 TABLET BY MOUTH  DAILY 90 tablet 3   sodium chloride (MURO 128) 5 % ophthalmic solution Place 2 drops into both eyes daily.     triamcinolone cream (KENALOG) 0.1 % APPLY TO ITCHY SPOTS ONCE DAILY  1   triamcinolone lotion (KENALOG) 0.1 % Apply 1 application topically 3  (three) times daily. 180 mL 2   No current facility-administered medications for this visit.   Facility-Administered Medications Ordered in Other Visits  Medication Dose Route Frequency Provider Last Rate Last Admin   sodium chloride flush (NS) 0.9 % injection 10 mL  10 mL Intracatheter PRN Truitt Merle, MD   10 mL at 08/25/18 1111    PHYSICAL EXAMINATION: ECOG PERFORMANCE STATUS: 0 - Asymptomatic  Vitals:   04/25/20 1007  BP: 129/82  Pulse: 67  Resp: 18  Temp: 98.5 F (36.9 C)  SpO2: 98%   Filed Weights   04/25/20 1007  Weight: 83.5 kg    GENERAL:alert, no distress and comfortable SKIN: skin color, texture, turgor are normal, no rashes or significant lesions EYES: normal, Conjunctiva are pink and non-injected, sclera clear  NECK: supple, thyroid normal size, non-tender, without nodularity LYMPH:  no palpable lymphadenopathy in the cervical, axillary  LUNGS: clear to auscultation and percussion with normal breathing effort HEART: regular rate & rhythm and no murmurs and no lower extremity edema ABDOMEN:abdomen soft, non-tender and normal bowel sounds Musculoskeletal:no cyanosis of digits and no clubbing  NEURO: alert & oriented x 3 with fluent speech, no focal motor/sensory deficits BREAST: S/p right lumpectomy: Surgical incision healed well (+) 1x2cm hard lump at 1:00 position of right breast. (+) mild skin erythema of right breast skin fold. No palpable mass, nodules or adenopathy bilaterally. Breast exam benign.   LABORATORY DATA:  I have reviewed the data as listed CBC Latest Ref Rng & Units 04/23/2020 01/18/2020 12/07/2019  WBC 4.0 - 10.5 K/uL 4.3 4.3 4.1  Hemoglobin 12.0 - 15.0 g/dL 13.5 13.8 14.0  Hematocrit 36 - 46 % 38.5 39.7 40.1  Platelets 150 - 400 K/uL 148(L) 156 160     CMP Latest Ref Rng & Units 04/23/2020 01/18/2020 12/07/2019  Glucose 70 - 99 mg/dL 109(H) 97 118(H)  BUN 8 - 23 mg/dL _0 Creatinine 0.44 - 1.00 mg/dL 0.83 0.76 0.79  Sodium 135 - 145 mmol/L  138 139 139  Potassium 3.5 - 5.1 mmol/L 3.7 3.7 3.6  Chloride 98 - 111 mmol/L 103 103 102  CO2 22 - 32 mmol/L _1 Calcium 8.9 - 10.3 mg/dL 9.7 9.2 9.2  Total Protein 6.5 - 8.1 g/dL 7.4 7.3 7.1  Total Bilirubin 0.3 - 1.2 mg/dL 0.7 0.6 0.6  Alkaline Phos 38 - 126 U/L 43 45 39  AST 15 - 41 U/L 27 39 45(H)  ALT 0 -  44 U/L 42 59(H) 68(H)      RADIOGRAPHIC STUDIES: I have personally reviewed the radiological images as listed and agreed with the findings in the report. No results found.   ASSESSMENT & PLAN:  Dawn [name] is a 69 y.o. female with    1. 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 Treated with right breast lumpectomy, adjuvant chemo and radiation.She tried Nerlynx but could not tolerate due to diarrhea,and does not want try again. -She started anti-estrogen therapy with letrozole. Due to joint pain I switched her to Anastrozole in 04/2018. She is tolerating well.  -Breast exam today (04/25/20) shows 2cm lump of right breast likely related to her surgery and mild skin erythema under breast. I encouraged her to keep skin fold clean and dry.  -Continue surveillance. Next mammogram in 04/2020 next week. Given her dense breast tissue, I discussed the option of additional screening with breast MRIs every 1-2 years. I also discussed Abbreviated MRIs which have $400 out-of-pocket cost if insurance does not cover this. She will think about it -Continue anastrozole -She has received both her COVID19 vaccinations.   2. Endometrial adenocarcinomawith lymph node metastasis, pT1apN1M0, FIGO stage IIIc, MSI-H, probable RP node recurrence in 12/2017 -Diagnosed in 04/2017. Treated with hysterectomy, BSO, adjuvant radiation, and chemo with Carbotaxol. -She has completed 2 years of Keytruda in May 2021. She has recovered well.  -We discussed her CT AP from 04/23/20 which showed stable exam  with no findings to suggest recurrent or metastatic disease.  -Continue with surveillance. Will repeat CT scan in 6 months.  -Continue PAC flush every 6-8 weeks. Will keep for another year. She is fine with keeping longer given she has difficult veins.   3. HTN, Acid reflux, High Cholesterol -WellControlled, Continue tof/u with PCP -She was seen to have Gastritis on 11/22/19 upper endoscopy/colonoscopy. She is on PPI.   4. Transaminitis -Secondary to fatty liver disease.  -With weight loss this Labs resolved now.    5.Osteopenia -DEXA scan in 05/2018 revealed T score of -2.0. -She is on monthly boniva, prescribed by her PCP, since 02/2018.Continue -ContinueVitamin D and Calcium supplements.Will check her Vitamin D level every 6 months.   6.Metabolic syndrome, hyperglycemia, Weight gain  -She has no history of diabetes. Her random blood glucose was 213, we discussed diabetic diet and exercise -She was recently found to have abnormal TSH levels. She has been seen by endocrinologist and has startedlevothyroxineon 01/04/19. Will monitor TSH level.  -Her weight was trending up. [facility] and has been able to lose some weight, 15 pounds. She is trying to lose another 15 pounds. Will continue.  -Her BG improved, at 109 (04/23/20) -Continue to follow-up with her primary care physician  7. Hypokalemia  -On KCL 57mq daily  -K at 3.7 on 04/23/20  8. Dizziness  -She notes occasional positional dizziness. This did improve when switching her BP meds to nighttime.  -I discussed this is likely vertigo which she has had before. If this progresses or persists, she can consult with ENT.    Plan -CT AP reviewed, NED  [facility]  -Lab, flush, f/u in 3 months  -Continue anastrozole   No problem-specific Assessment & Plan notes found for this  encounter.   Orders Placed This Encounter  Procedures   DG BONE DENSITY (DXA)    Standing Status:   Future    Standing Expiration Date:   04/25/2021    Scheduling Instructions:     [facility]    Order Specific Question:   Reason for Exam (SYMPTOM  OR DIAGNOSIS REQUIRED)    Answer:   screening    Order Specific Question:   Preferred imaging location?    Answer:   External   All questions were answered. The patient knows to call the clinic with any problems, questions or concerns. No barriers to learning was detected. The total time spent in the appointment was 30 minutes.     Truitt Merle, MD 04/25/2020   I, Joslyn Devon, am acting as scribe for Truitt Merle, MD.   I have reviewed the above documentation for accuracy and completeness, and I agree with the above.

## 2020-04-23 ENCOUNTER — Inpatient Hospital Stay: Payer: Medicare Other | Attending: Hematology

## 2020-04-23 ENCOUNTER — Ambulatory Visit (HOSPITAL_COMMUNITY)
Admission: RE | Admit: 2020-04-23 | Discharge: 2020-04-23 | Disposition: A | Discharge status: Home / Self Care | Payer: Medicare Other | Source: Ambulatory Visit | Attending: Hematology | Admitting: Hematology

## 2020-04-23 ENCOUNTER — Encounter (HOSPITAL_COMMUNITY): Payer: Self-pay

## 2020-04-23 ENCOUNTER — Inpatient Hospital Stay: Payer: Medicare Other

## 2020-04-23 ENCOUNTER — Other Ambulatory Visit: Payer: Self-pay

## 2020-04-23 DIAGNOSIS — K219 Gastro-esophageal reflux disease without esophagitis: Secondary | ICD-10-CM | POA: Insufficient documentation

## 2020-04-23 DIAGNOSIS — C50211 Malignant neoplasm of upper-inner quadrant of right female breast: Secondary | ICD-10-CM | POA: Diagnosis not present

## 2020-04-23 DIAGNOSIS — R946 Abnormal results of thyroid function studies: Secondary | ICD-10-CM | POA: Insufficient documentation

## 2020-04-23 DIAGNOSIS — C541 Malignant neoplasm of endometrium: Secondary | ICD-10-CM | POA: Diagnosis not present

## 2020-04-23 DIAGNOSIS — E876 Hypokalemia: Secondary | ICD-10-CM | POA: Insufficient documentation

## 2020-04-23 DIAGNOSIS — Z170 Estrogen receptor positive status [ER+]: Secondary | ICD-10-CM | POA: Diagnosis not present

## 2020-04-23 DIAGNOSIS — I10 Essential (primary) hypertension: Secondary | ICD-10-CM | POA: Diagnosis not present

## 2020-04-23 DIAGNOSIS — Z8542 Personal history of malignant neoplasm of other parts of uterus: Secondary | ICD-10-CM | POA: Insufficient documentation

## 2020-04-23 DIAGNOSIS — E8881 Metabolic syndrome: Secondary | ICD-10-CM | POA: Diagnosis not present

## 2020-04-23 DIAGNOSIS — E7800 Pure hypercholesterolemia, unspecified: Secondary | ICD-10-CM | POA: Insufficient documentation

## 2020-04-23 DIAGNOSIS — Z79811 Long term (current) use of aromatase inhibitors: Secondary | ICD-10-CM | POA: Insufficient documentation

## 2020-04-23 DIAGNOSIS — Z923 Personal history of irradiation: Secondary | ICD-10-CM | POA: Insufficient documentation

## 2020-04-23 DIAGNOSIS — M8580 Other specified disorders of bone density and structure, unspecified site: Secondary | ICD-10-CM | POA: Insufficient documentation

## 2020-04-23 DIAGNOSIS — Z95828 Presence of other vascular implants and grafts: Secondary | ICD-10-CM

## 2020-04-23 DIAGNOSIS — Z9221 Personal history of antineoplastic chemotherapy: Secondary | ICD-10-CM | POA: Insufficient documentation

## 2020-04-23 DIAGNOSIS — R42 Dizziness and giddiness: Secondary | ICD-10-CM | POA: Insufficient documentation

## 2020-04-23 LAB — CBC WITH DIFFERENTIAL/PLATELET
Abs Immature Granulocytes: 0 10*3/uL (ref 0.00–0.07)
Basophils Absolute: 0 10*3/uL (ref 0.0–0.1)
Basophils Relative: 1 %
Eosinophils Absolute: 0.1 10*3/uL (ref 0.0–0.5)
Eosinophils Relative: 2 %
HCT: 38.5 % (ref 36.0–46.0)
Hemoglobin: 13.5 g/dL (ref 12.0–15.0)
Immature Granulocytes: 0 %
Lymphocytes Relative: 46 %
Lymphs Abs: 2 10*3/uL (ref 0.7–4.0)
MCH: 34.8 pg — ABNORMAL HIGH (ref 26.0–34.0)
MCHC: 35.1 g/dL (ref 30.0–36.0)
MCV: 99.2 fL (ref 80.0–100.0)
Monocytes Absolute: 0.4 10*3/uL (ref 0.1–1.0)
Monocytes Relative: 9 %
Neutro Abs: 1.8 10*3/uL (ref 1.7–7.7)
Neutrophils Relative %: 42 %
Platelets: 148 10*3/uL — ABNORMAL LOW (ref 150–400)
RBC: 3.88 MIL/uL (ref 3.87–5.11)
RDW: 12 % (ref 11.5–15.5)
WBC: 4.3 10*3/uL (ref 4.0–10.5)
nRBC: 0 % (ref 0.0–0.2)

## 2020-04-23 LAB — COMPREHENSIVE METABOLIC PANEL WITH GFR
ALT: 42 U/L (ref 0–44)
AST: 27 U/L (ref 15–41)
Albumin: 3.9 g/dL (ref 3.5–5.0)
Alkaline Phosphatase: 43 U/L (ref 38–126)
Anion gap: 9 (ref 5–15)
BUN: 11 mg/dL (ref 8–23)
CO2: 26 mmol/L (ref 22–32)
Calcium: 9.7 mg/dL (ref 8.9–10.3)
Chloride: 103 mmol/L (ref 98–111)
Creatinine, Ser: 0.83 mg/dL (ref 0.44–1.00)
GFR calc Af Amer: >60 mL/min
GFR calc non Af Amer: >60 mL/min
Glucose, Bld: 109 mg/dL — ABNORMAL HIGH (ref 70–99)
Potassium: 3.7 mmol/L (ref 3.5–5.1)
Sodium: 138 mmol/L (ref 135–145)
Total Bilirubin: 0.7 mg/dL (ref 0.3–1.2)
Total Protein: 7.4 g/dL (ref 6.5–8.1)

## 2020-04-23 LAB — TSH: TSH: 2.084 u[IU]/mL (ref 0.308–3.960)

## 2020-04-23 MED ORDER — IOHEXOL 300 MG/ML  SOLN
100.0000 mL | Freq: Once | INTRAMUSCULAR | Status: AC | PRN
  Administered 2020-04-23: 100 mL via INTRAVENOUS

## 2020-04-23 MED ORDER — SODIUM CHLORIDE (PF) 0.9 % IJ SOLN
INTRAMUSCULAR | Status: AC
  Filled 2020-04-23: qty 50

## 2020-04-23 MED ORDER — SODIUM CHLORIDE 0.9% FLUSH
10.0000 mL | Freq: Once | INTRAVENOUS | Status: AC
  Administered 2020-04-23: 10 mL
  Filled 2020-04-23: qty 10

## 2020-04-23 MED ORDER — HEPARIN SOD (PORK) LOCK FLUSH 100 UNIT/ML IV SOLN
INTRAVENOUS | Status: AC
  Filled 2020-04-23: qty 5

## 2020-04-23 MED ORDER — HEPARIN SOD (PORK) LOCK FLUSH 100 UNIT/ML IV SOLN
500.0000 [IU] | Freq: Once | INTRAVENOUS | Status: AC
  Administered 2020-04-23: 500 [IU] via INTRAVENOUS

## 2020-04-23 NOTE — Progress Notes (Signed)
Power port needle used for access, needle left in for CT scan appt today.  Pt aware to not leave radiology after scan is finished with port still accessed.

## 2020-04-23 NOTE — Patient Instructions (Signed)

## 2020-04-24 ENCOUNTER — Other Ambulatory Visit: Payer: Medicare Other

## 2020-04-25 ENCOUNTER — Inpatient Hospital Stay (HOSPITAL_BASED_OUTPATIENT_CLINIC_OR_DEPARTMENT_OTHER): Payer: Medicare Other | Admitting: Hematology

## 2020-04-25 ENCOUNTER — Other Ambulatory Visit: Payer: Self-pay

## 2020-04-25 ENCOUNTER — Encounter: Payer: Self-pay | Admitting: Hematology

## 2020-04-25 ENCOUNTER — Telehealth: Payer: Self-pay | Admitting: Hematology

## 2020-04-25 VITALS — BP 129/82 | HR 67 | Temp 98.5°F | Resp 18 | Ht 63.0 in | Wt 184.0 lb

## 2020-04-25 DIAGNOSIS — C541 Malignant neoplasm of endometrium: Secondary | ICD-10-CM

## 2020-04-25 DIAGNOSIS — Z8542 Personal history of malignant neoplasm of other parts of uterus: Secondary | ICD-10-CM | POA: Diagnosis not present

## 2020-04-25 DIAGNOSIS — Z170 Estrogen receptor positive status [ER+]: Secondary | ICD-10-CM | POA: Diagnosis not present

## 2020-04-25 DIAGNOSIS — C50211 Malignant neoplasm of upper-inner quadrant of right female breast: Secondary | ICD-10-CM | POA: Diagnosis not present

## 2020-04-25 DIAGNOSIS — M810 Age-related osteoporosis without current pathological fracture: Secondary | ICD-10-CM | POA: Diagnosis not present

## 2020-04-25 DIAGNOSIS — Z923 Personal history of irradiation: Secondary | ICD-10-CM | POA: Diagnosis not present

## 2020-04-25 DIAGNOSIS — Z79811 Long term (current) use of aromatase inhibitors: Secondary | ICD-10-CM | POA: Diagnosis not present

## 2020-04-25 DIAGNOSIS — Z9221 Personal history of antineoplastic chemotherapy: Secondary | ICD-10-CM | POA: Diagnosis not present

## 2020-04-25 NOTE — Telephone Encounter (Signed)
Scheduled per 8/11 los. Printed avs and calendar for pt.

## 2020-04-27 ENCOUNTER — Ambulatory Visit: Payer: Medicare Other | Admitting: Hematology

## 2020-04-30 ENCOUNTER — Other Ambulatory Visit: Payer: Self-pay

## 2020-04-30 ENCOUNTER — Encounter (INDEPENDENT_AMBULATORY_CARE_PROVIDER_SITE_OTHER): Payer: Self-pay | Admitting: Physician Assistant

## 2020-04-30 ENCOUNTER — Ambulatory Visit (INDEPENDENT_AMBULATORY_CARE_PROVIDER_SITE_OTHER): Payer: Medicare Other | Admitting: Physician Assistant

## 2020-04-30 VITALS — BP 114/80 | HR 67 | Temp 98.0°F | Ht 63.0 in | Wt 174.0 lb

## 2020-04-30 DIAGNOSIS — R739 Hyperglycemia, unspecified: Secondary | ICD-10-CM

## 2020-04-30 DIAGNOSIS — E7849 Other hyperlipidemia: Secondary | ICD-10-CM | POA: Diagnosis not present

## 2020-04-30 DIAGNOSIS — Z6830 Body mass index (BMI) 30.0-30.9, adult: Secondary | ICD-10-CM

## 2020-04-30 DIAGNOSIS — E669 Obesity, unspecified: Secondary | ICD-10-CM

## 2020-04-30 DIAGNOSIS — E559 Vitamin D deficiency, unspecified: Secondary | ICD-10-CM

## 2020-05-01 LAB — LIPID PANEL
Chol/HDL Ratio: 4.6 ratio — ABNORMAL HIGH (ref 0.0–4.4)
Cholesterol, Total: 256 mg/dL — ABNORMAL HIGH (ref 100–199)
HDL: 56 mg/dL (ref >39)
LDL Chol Calc (NIH): 173 mg/dL — ABNORMAL HIGH (ref 0–99)
Triglycerides: 149 mg/dL (ref 0–149)
VLDL Cholesterol Cal: 27 mg/dL (ref 5–40)

## 2020-05-01 LAB — HEMOGLOBIN A1C
Est. average glucose Bld gHb Est-mCnc: 111 mg/dL
Hgb A1c MFr Bld: 5.5 % (ref 4.8–5.6)

## 2020-05-01 LAB — INSULIN, RANDOM: INSULIN: 26.4 u[IU]/mL — ABNORMAL HIGH (ref 2.6–24.9)

## 2020-05-01 LAB — VITAMIN D 25 HYDROXY (VIT D DEFICIENCY, FRACTURES): Vit D, 25-Hydroxy: 45.2 ng/mL (ref 30.0–100.0)

## 2020-05-01 NOTE — Progress Notes (Signed)
Chief Complaint:   OBESITY Dawn [name] is here to discuss her progress with her obesity treatment plan along with follow-up of her obesity related diagnoses. [name] is keeping a food journal and adhering to recommended goals of 1200 calories and 80 grams of protein and states she is following her eating plan approximately 70% of the time. [name] states she is walking 2-3 miles 45 minutes 6-7 times per week.  Today's visit was #: 11 Starting weight: 190 lbs Starting date: 10/03/2019 Today's weight: 174 lbs Today's date: 04/30/2020 Total lbs lost to date: 16 Total lbs lost since last in-office visit: 1  Interim History: Dawn [name] just returned from [place] for her daughter's wedding. She has not been journaling. She wants to lose 14 more pounds and is ready to get back on track.   Subjective:   Vitamin D deficiency. [name] is on OTC Vitamin D supplementation. No nausea, vomiting, or muscle weakness.    Ref. Range 12/07/2019 11:23  Vitamin D, 25-Hydroxy Latest Ref Range: 30 - 100 ng/mL 47.80   Other hyperlipidemia. Dawn [name] is on no medication. No chest pain.  Lab Results  Component Value Date   CHOL 256 (H) 04/30/2020   HDL 56 04/30/2020   LDLCALC 173 (H) 04/30/2020   TRIG 149 04/30/2020   CHOLHDL 4.6 (H) 04/30/2020   Lab Results  Component Value Date   ALT 42 04/23/2020   AST 27 04/23/2020   ALKPHOS 43 04/23/2020   BILITOT 0.7 04/23/2020   The 10-year ASCVD risk score [name], et al., 2013) is: 16.6%   Values used to calculate the score:     Age: 32 years     Sex: Female     Is Non-Hispanic African American: No     Diabetic: Yes     Tobacco smoker: No     Systolic Blood Pressure: 193 mmHg     Is BP treated: Yes     HDL Cholesterol: 56 mg/dL     Total Cholesterol: 256 mg/dL  Hyperglycemia.Dawn [name] has a history of some elevated blood glucose readings without a diagnosis of diabetes. She is on no medication. No polyphagia. She is due for  labs.  Assessment/Plan:   Vitamin D deficiency. Low Vitamin D level contributes to fatigue and are associated with obesity, breast, and colon cancer. She agrees to continue to take OTC Vitamin D as directed and VITAMIN D 25 Hydroxy (Vit-D Deficiency, Fractures) level will be checked today.  Other hyperlipidemia. Cardiovascular risk and specific lipid/LDL goals reviewed.  We discussed several lifestyle modifications today and Davin will continue to work on diet, exercise and weight loss efforts. Orders and follow up as documented in patient record. Lipid panel will be checked today.  Counseling Intensive lifestyle modifications are the first line treatment for this issue. . Dietary changes: Increase soluble fiber. Decrease simple carbohydrates. . Exercise changes: Moderate to vigorous-intensity aerobic activity 150 minutes per week if tolerated. . Lipid-lowering medications: see documented in medical record.   Hyperglycemia. Fasting labs will be obtained and results with be discussed with Dawn [name] in 2 weeks at her follow up visit. In the meanwhile Dawn [name] was started on a lower simple carbohydrate diet and will work on weight loss efforts. Hemoglobin A1c, Insulin, random levels will be checked today.  Class 1 obesity with serious comorbidity and body mass index (BMI) of 30.0 to 30.9 in adult, unspecified obesity type.  [name] is currently in the action stage of change. As such, her goal is to  continue with weight loss efforts. She has agreed to keeping a food journal and adhering to recommended goals of 1200 calories and 80 grams of protein.   Exercise goals: Older adults should follow the adult guidelines. When older adults cannot meet the adult guidelines, they should be as physically active as their abilities and conditions will allow.   Behavioral modification strategies: avoiding temptations, planning for success and keeping a strict food journal.  [name] has agreed to follow-up with our  clinic in 3 weeks. She was informed of the importance of frequent follow-up visits to maximize her success with intensive lifestyle modifications for her multiple health conditions.   [name] was informed we would discuss her lab results at her next visit unless there is a critical issue that needs to be addressed sooner. [name] agreed to keep her next visit at the agreed upon time to discuss these results.  Objective:   Blood pressure 114/80, pulse 67, temperature 98 F (36.7 C), temperature source Oral, height 5\' 3"  (1.6 m), weight 174 lb (78.9 kg), SpO2 97 %. Body mass index is 30.82 kg/m.  General: Cooperative, alert, well developed, in no acute distress. HEENT: Conjunctivae and lids unremarkable. Cardiovascular: Regular rhythm.  Lungs: Normal work of breathing. Neurologic: No focal deficits.   Lab Results  Component Value Date   CREATININE 0.83 04/23/2020   BUN 11 04/23/2020   NA 138 04/23/2020   K 3.7 04/23/2020   CL 103 04/23/2020   CO2 26 04/23/2020   Lab Results  Component Value Date   ALT 42 04/23/2020   AST 27 04/23/2020   ALKPHOS 43 04/23/2020   BILITOT 0.7 04/23/2020   Lab Results  Component Value Date   HGBA1C 5.5 04/30/2020   HGBA1C 5.4 10/03/2019   HGBA1C 5.4 02/09/2019   HGBA1C 5.5 06/07/2018   HGBA1C 5.2 11/19/2017   Lab Results  Component Value Date   INSULIN 26.4 (H) 04/30/2020   INSULIN 24.0 10/03/2019   Lab Results  Component Value Date   TSH 2.084 04/23/2020   Lab Results  Component Value Date   CHOL 256 (H) 04/30/2020   HDL 56 04/30/2020   LDLCALC 173 (H) 04/30/2020   TRIG 149 04/30/2020   CHOLHDL 4.6 (H) 04/30/2020   Lab Results  Component Value Date   WBC 4.3 04/23/2020   HGB 13.5 04/23/2020   HCT 38.5 04/23/2020   MCV 99.2 04/23/2020   PLT 148 (L) 04/23/2020   Lab Results  Component Value Date   FERRITIN 176 12/17/2012   Obesity Behavioral Intervention Documentation for Insurance:   Approximately 15 minutes were spent  on the discussion below.  ASK: We discussed the diagnosis of obesity with Dawn [name] today and Dawn [name] agreed to give [name] permission to discuss obesity behavioral modification therapy today.  ASSESS: Dawn [name] has the diagnosis of obesity and her BMI today is 30.8. Daven is in the action stage of change.   ADVISE: Dameka was educated on the multiple health risks of obesity as well as the benefit of weight loss to improve her health. She was advised of the need for long term treatment and the importance of lifestyle modifications to improve her current health and to decrease her risk of future health problems.  AGREE: Multiple dietary modification options and treatment options were discussed and Latishia agreed to follow the recommendations documented in the above note.  ARRANGE: Emeree was educated on the importance of frequent visits to treat obesity as outlined per CMS and USPSTF guidelines and agreed to  schedule her next follow up appointment today.  Attestation Statements:   Reviewed by clinician on day of visit: allergies, medications, problem list, medical history, surgical history, family history, social history, and previous encounter notes.  IMichaelene Song, am acting as transcriptionist for [name], PA-C   I have reviewed the above documentation for accuracy and completeness, and I agree with the above. [name], PA-C

## 2020-05-02 DIAGNOSIS — R922 Inconclusive mammogram: Secondary | ICD-10-CM | POA: Diagnosis not present

## 2020-05-02 DIAGNOSIS — Z853 Personal history of malignant neoplasm of breast: Secondary | ICD-10-CM | POA: Diagnosis not present

## 2020-05-02 LAB — HM MAMMOGRAPHY

## 2020-05-09 ENCOUNTER — Encounter: Payer: Self-pay | Admitting: Hematology

## 2020-05-23 ENCOUNTER — Ambulatory Visit (INDEPENDENT_AMBULATORY_CARE_PROVIDER_SITE_OTHER): Payer: Medicare Other | Admitting: Physician Assistant

## 2020-05-23 ENCOUNTER — Other Ambulatory Visit: Payer: Self-pay

## 2020-05-23 ENCOUNTER — Encounter (INDEPENDENT_AMBULATORY_CARE_PROVIDER_SITE_OTHER): Payer: Self-pay | Admitting: Physician Assistant

## 2020-05-23 VITALS — BP 116/77 | HR 73 | Temp 97.8°F | Ht 63.0 in | Wt 174.0 lb

## 2020-05-23 DIAGNOSIS — E8881 Metabolic syndrome: Secondary | ICD-10-CM | POA: Diagnosis not present

## 2020-05-23 DIAGNOSIS — E7849 Other hyperlipidemia: Secondary | ICD-10-CM

## 2020-05-23 DIAGNOSIS — E669 Obesity, unspecified: Secondary | ICD-10-CM | POA: Diagnosis not present

## 2020-05-23 DIAGNOSIS — Z6830 Body mass index (BMI) 30.0-30.9, adult: Secondary | ICD-10-CM | POA: Diagnosis not present

## 2020-05-24 NOTE — Progress Notes (Signed)
Chief Complaint:   OBESITY Dawn [name] is here to discuss her progress with her obesity treatment plan along with follow-up of her obesity related diagnoses. Dawn [name] is on keeping a food journal and adhering to recommended goals of 1200 calories and 80 grams of protein and states she is following her eating plan approximately 80% of the time. Dawn [name] states she is walking for 45-60 minutes 7 times per week.  Today's visit was #: 12 Starting weight: 190 lbs Starting date: 10/03/2019 Today's weight: 174 lbs Today's date: 05/23/2020 Total lbs lost to date: 16 lbs Total lbs lost since last in-office visit: 0  Interim History: Dawn [name] states that she celebrated a family birthday recently and indulged in cake.  She is reaching 1300 calories daily and 70 grams of protein daily.  Subjective:   1. Insulin resistance Dawn [name] has a diagnosis of insulin resistance based on her elevated fasting insulin level >5. She continues to work on diet and exercise to decrease her risk of diabetes.  Insulin level is worsening.  Reviewed labs today.  Discussed initiation of metformin or GLP-1.  Lab Results  Component Value Date   INSULIN 26.4 (H) 04/30/2020   INSULIN 24.0 10/03/2019   Lab Results  Component Value Date   HGBA1C 5.5 04/30/2020   2. Other hyperlipidemia Dawn [name] has hyperlipidemia and has been trying to improve her cholesterol levels with intensive lifestyle modification including a low saturated fat diet, exercise and weight loss. She denies any chest pain, claudication or myalgias.  Reviewed labs today.  TC, LDL, TG worsening.  No on any medication.  She has intolerance to Crestor.  She would like to speak with her PCP regarding medication.  Lab Results  Component Value Date   ALT 42 04/23/2020   AST 27 04/23/2020   ALKPHOS 43 04/23/2020   BILITOT 0.7 04/23/2020   Lab Results  Component Value Date   CHOL 256 (H) 04/30/2020   HDL 56 04/30/2020   LDLCALC 173 (H) 04/30/2020   TRIG 149  04/30/2020   CHOLHDL 4.6 (H) 04/30/2020   Assessment/Plan:   1. Insulin resistance Worsening.  Discussed labs with patient today.  Dawn [name] will continue to work on weight loss, exercise, and decreasing simple carbohydrates to help decrease the risk of diabetes. Dawn [name] agreed to follow-up with [name] as directed to closely monitor her progress.  She would like to wait on initiation of GLP-1 or metformin.  Recheck labs in 3 months.  2. Other hyperlipidemia Worsening.  Discussed labs with patient today.  Cardiovascular risk and specific lipid/LDL goals reviewed.  We discussed several lifestyle modifications today and Skarlett will continue to work on diet, exercise and weight loss efforts. Orders and follow up as documented in patient record.  Will follow-up with PCP for possible initiation of cholesterol medications.  Counseling Intensive lifestyle modifications are the first line treatment for this issue. . Dietary changes: Increase soluble fiber. Decrease simple carbohydrates. . Exercise changes: Moderate to vigorous-intensity aerobic activity 150 minutes per week if tolerated. . Lipid-lowering medications: see documented in medical record.  3. Class 1 obesity with serious comorbidity and body mass index (BMI) of 30.0 to 30.9 in adult, unspecified obesity type Dawn [name] is currently in the action stage of change. As such, her goal is to continue with weight loss efforts. She has agreed to keeping a food journal and adhering to recommended goals of 1200 calories and 80 grams of protein daily.   Exercise goals: Older adults should follow the adult guidelines. When  older adults cannot meet the adult guidelines, they should be as physically active as their abilities and conditions will allow.  Older adults should do exercises that maintain or improve balance if they are at risk of falling.   Behavioral modification strategies: increasing lean protein intake and meal planning and cooking strategies.  Dawn [name]  has agreed to follow-up with our clinic in 3 weeks. She was informed of the importance of frequent follow-up visits to maximize her success with intensive lifestyle modifications for her multiple health conditions.   Objective:   Blood pressure 116/77, pulse 73, temperature 97.8 F (36.6 C), temperature source Oral, height 5\' 3"  (1.6 m), weight 174 lb (78.9 kg), SpO2 97 %. Body mass index is 30.82 kg/m.  General: Cooperative, alert, well developed, in no acute distress. HEENT: Conjunctivae and lids unremarkable. Cardiovascular: Regular rhythm.  Lungs: Normal work of breathing. Neurologic: No focal deficits.   Lab Results  Component Value Date   CREATININE 0.83 04/23/2020   BUN 11 04/23/2020   NA 138 04/23/2020   K 3.7 04/23/2020   CL 103 04/23/2020   CO2 26 04/23/2020   Lab Results  Component Value Date   ALT 42 04/23/2020   AST 27 04/23/2020   ALKPHOS 43 04/23/2020   BILITOT 0.7 04/23/2020   Lab Results  Component Value Date   HGBA1C 5.5 04/30/2020   HGBA1C 5.4 10/03/2019   HGBA1C 5.4 02/09/2019   HGBA1C 5.5 06/07/2018   HGBA1C 5.2 11/19/2017   Lab Results  Component Value Date   INSULIN 26.4 (H) 04/30/2020   INSULIN 24.0 10/03/2019   Lab Results  Component Value Date   TSH 2.084 04/23/2020   Lab Results  Component Value Date   CHOL 256 (H) 04/30/2020   HDL 56 04/30/2020   LDLCALC 173 (H) 04/30/2020   TRIG 149 04/30/2020   CHOLHDL 4.6 (H) 04/30/2020   Lab Results  Component Value Date   WBC 4.3 04/23/2020   HGB 13.5 04/23/2020   HCT 38.5 04/23/2020   MCV 99.2 04/23/2020   PLT 148 (L) 04/23/2020   Lab Results  Component Value Date   FERRITIN 176 12/17/2012   Obesity Behavioral Intervention:   Approximately 15 minutes were spent on the discussion below.  ASK: We discussed the diagnosis of obesity with Dawn [name] today and Dawn [name] agreed to give [name] permission to discuss obesity behavioral modification therapy today.  ASSESS: Dawn [name] has the  diagnosis of obesity and her BMI today is 30.8. Dawn [name] is in the action stage of change.   ADVISE: Dawn [name] was educated on the multiple health risks of obesity as well as the benefit of weight loss to improve her health. She was advised of the need for long term treatment and the importance of lifestyle modifications to improve her current health and to decrease her risk of future health problems.  AGREE: Multiple dietary modification options and treatment options were discussed and Dawn [name] agreed to follow the recommendations documented in the above note.  ARRANGE: Dawn [name] was educated on the importance of frequent visits to treat obesity as outlined per CMS and USPSTF guidelines and agreed to schedule her next follow up appointment today.  Attestation Statements:   Reviewed by clinician on day of visit: allergies, medications, problem list, medical history, surgical history, family history, social history, and previous encounter notes.  I, Water quality scientist, CMA, am acting as transcriptionist for [name], PA-C  I have reviewed the above documentation for accuracy and completeness, and I agree with the above. -  [name], PA-C

## 2020-06-06 ENCOUNTER — Other Ambulatory Visit: Payer: Self-pay

## 2020-06-06 ENCOUNTER — Inpatient Hospital Stay: Payer: Medicare Other | Attending: Hematology

## 2020-06-06 DIAGNOSIS — Z452 Encounter for adjustment and management of vascular access device: Secondary | ICD-10-CM | POA: Diagnosis not present

## 2020-06-06 DIAGNOSIS — C50211 Malignant neoplasm of upper-inner quadrant of right female breast: Secondary | ICD-10-CM | POA: Diagnosis not present

## 2020-06-06 DIAGNOSIS — Z95828 Presence of other vascular implants and grafts: Secondary | ICD-10-CM

## 2020-06-06 MED ORDER — SODIUM CHLORIDE 0.9% FLUSH
10.0000 mL | INTRAVENOUS | Status: DC | PRN
  Administered 2020-06-06: 10 mL via INTRAVENOUS
  Filled 2020-06-06: qty 10

## 2020-06-06 MED ORDER — HEPARIN SOD (PORK) LOCK FLUSH 100 UNIT/ML IV SOLN
500.0000 [IU] | Freq: Once | INTRAVENOUS | Status: AC | PRN
  Administered 2020-06-06: 500 [IU] via INTRAVENOUS
  Filled 2020-06-06: qty 5

## 2020-06-06 NOTE — Patient Instructions (Signed)

## 2020-06-08 ENCOUNTER — Encounter: Payer: Self-pay | Admitting: Family Medicine

## 2020-06-08 ENCOUNTER — Ambulatory Visit (INDEPENDENT_AMBULATORY_CARE_PROVIDER_SITE_OTHER): Payer: Medicare Other | Admitting: Family Medicine

## 2020-06-08 VITALS — BP 133/64 | HR 65 | Ht 63.0 in | Wt 179.0 lb

## 2020-06-08 DIAGNOSIS — Z0000 Encounter for general adult medical examination without abnormal findings: Secondary | ICD-10-CM | POA: Diagnosis not present

## 2020-06-08 DIAGNOSIS — I700 Atherosclerosis of aorta: Secondary | ICD-10-CM

## 2020-06-08 DIAGNOSIS — R42 Dizziness and giddiness: Secondary | ICD-10-CM | POA: Diagnosis not present

## 2020-06-08 DIAGNOSIS — M71349 Other bursal cyst, unspecified hand: Secondary | ICD-10-CM

## 2020-06-08 MED ORDER — ATORVASTATIN CALCIUM 10 MG PO TABS: 10.0000 mg | ORAL_TABLET | Freq: Every day | ORAL | 1 refills | Status: DC

## 2020-06-08 MED ORDER — AMBULATORY NON FORMULARY MEDICATION: 0 refills | Status: DC

## 2020-06-08 NOTE — Progress Notes (Signed)
Subjective:   Dawn [name] is a 69 y.o. female who presents for Medicare Annual (Subsequent) preventive examination.  Review of Systems    ROS is negative       Objective:    Today's Vitals   06/08/20 1409  BP: 133/64  Pulse: 65  SpO2: 98%  Weight: 179 lb (81.2 kg)  Height: 5\' 3"  (1.6 m)   Body mass index is 31.71 kg/m.  Advanced Directives 02/08/2020 01/18/2020 10/26/2019 03/25/2019 11/17/2018 03/10/2018 02/17/2018  Does Patient Have a Medical Advance Directive? Yes Yes Yes Yes Yes Yes Yes  Type of Advance Directive - [place];Living will - Living will [place];Living will - -  Does patient want to make changes to medical advance directive? No - Patient declined - - - No - Patient declined - -  Copy of [place] in Chart? - - - - Yes - validated most recent copy scanned in chart (See row information) Yes Yes    Current Medications (verified) Outpatient Encounter Medications as of 06/08/2020  Medication Sig  . acetaminophen (TYLENOL) 500 MG tablet Take 500 mg by mouth every 6 (six) hours as needed for moderate pain.  Marland Kitchen anastrozole (ARIMIDEX) 1 MG tablet TAKE 1 TABLET BY MOUTH  DAILY  . Ascorbic Acid (VITAMIN C) 1000 MG tablet Take 1,000 mg by mouth daily.  Marland Kitchen b complex vitamins capsule Take 1 capsule by mouth daily.  . Cholecalciferol (VITAMIN D3) 75 MCG (3000 UT) TABS Take 1 capsule by mouth daily.  . diphenhydrAMINE (BENADRYL) 25 MG tablet Take 25-50 mg by mouth every 6 (six) hours as needed for sleep.  . fluticasone (FLONASE) 50 MCG/ACT nasal spray Place 2 sprays into both nostrils daily.  Marland Kitchen gabapentin (NEURONTIN) 100 MG capsule Take 1 capsule (100 mg total) by mouth 3 (three) times daily.  Marland Kitchen ibandronate (BONIVA) 150 MG tablet TAKE 1 TABLET BY MOUTH  EVERY 30 DAYS  . levothyroxine (SYNTHROID) 50 MCG tablet TAKE 1 TABLET BY MOUTH  DAILY  . lidocaine-prilocaine (EMLA) cream APPLY TO AFFECTED AREA ONCE  . Multiple  Vitamin (MULTIVITAMIN) capsule Take 1 capsule by mouth daily.    Marland Kitchen olmesartan-hydrochlorothiazide (BENICAR HCT) 20-12.5 MG tablet TAKE 1 TABLET BY MOUTH  DAILY  . pantoprazole (PROTONIX) 20 MG tablet Take 1 tablet (20 mg total) by mouth daily.  . potassium chloride SA (KLOR-CON) 20 MEQ tablet TAKE 1 TABLET BY MOUTH  DAILY  . sodium chloride (MURO 128) 5 % ophthalmic solution Place 2 drops into both eyes daily.  Marland Kitchen triamcinolone cream (KENALOG) 0.1 % APPLY TO ITCHY SPOTS ONCE DAILY  . triamcinolone lotion (KENALOG) 0.1 % Apply 1 application topically 3 (three) times daily.  . AMBULATORY NON FORMULARY MEDICATION Medication Name: Shingrix IM x 1.  Repeat in 2-6 months.  Marland Kitchen atorvastatin (LIPITOR) 10 MG tablet Take 1 tablet (10 mg total) by mouth daily.   Facility-Administered Encounter Medications as of 06/08/2020  Medication  . sodium chloride flush (NS) 0.9 % injection 10 mL    Allergies (verified) Ciprofloxacin, Paclitaxel, Crestor [rosuvastatin calcium], Fosamax [alendronate sodium], Livalo [pitavastatin], and Penicillins   History: Past Medical History:  Diagnosis Date  . Anemia    as a child.  . Arthritis   . Bilateral cataracts   . Bilateral leg cramps   . Cancer ([place])    skin  . Dyspnea   . Endometrial cancer ([place])   . Family history of bladder cancer   . Family  history of colon cancer in father   . Fatty liver   . Fuchs' corneal dystrophy   . GERD (gastroesophageal reflux disease)   . Hearing loss    Right side 30%  . Heartburn   . History of hiatal hernia    small size  . History of radiation therapy 08/21/17, 08/28/17,09/01/17, 09/11/17, 09/17/17   Vaginal cuff brachytherapy.   Marland Kitchen History of radiation therapy 11/11/17- 12/08/17   Right Breast treated to 40.05 Gy with 15 fx of 2.67 Gy and a boost of 10 Gy with 5 fx.   . Hyperlipidemia   . Hypertension   . Hypothyroidism   . IBS (irritable bowel syndrome)    hx of  . 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 NAFL (nonalcoholic fatty liver)   . Obesity   . Osteoarthritis   . Osteopenia   . PONV (postoperative nausea and vomiting)   . 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 Uterine cancer ([place]) 04/2017   endometrial cancer  . Varices, gastric   . Vertigo    Past Surgical History:  Procedure Laterality Date  . BREAST BIOPSY Right 04/27/2017  . BREAST LUMPECTOMY WITH RADIOACTIVE SEED AND SENTINEL LYMPH NODE BIOPSY Right 05/25/2017   Procedure: RIGHT BREAST LUMPECTOMY WITH RADIOACTIVE SEED AND  RIGHT AXILLARY SENTINEL LYMPH NODE BIOPSY;  Surgeon: Excell Seltzer, MD;  Location: [facility];  Service: General;  Laterality: Right;  . CATARACT EXTRACTION, BILATERAL    . COLONOSCOPY    . HYSTEROSCOPY WITH D & C N/A 04/29/2017   Procedure: DILATATION AND CURETTAGE /HYSTEROSCOPY;  Surgeon: Linda Hedges, DO;  Location: [facility];  Service: Gynecology;  Laterality: N/A;  . PORTACATH PLACEMENT Left 05/25/2017   Procedure: INSERTION PORT-A-CATH;  Surgeon: Excell Seltzer, MD;  Location: [facility];  Service: General;  Laterality: Left;  . ROBOTIC ASSISTED TOTAL HYSTERECTOMY WITH BILATERAL SALPINGO OOPHERECTOMY N/A 06/09/2017   Procedure: XI ROBOTIC ASSISTED TOTAL LAPOROSCOPIC HYSTERECTOMY WITH BILATERAL SALPINGO OOPHORECTOMY;  Surgeon: Everitt Amber, MD;  Location: [facility];  Service: Gynecology;  Laterality: N/A;  . SENTINEL NODE BIOPSY N/A 06/09/2017   Procedure: SENTINEL NODE BIOPSY;  Surgeon: Everitt Amber, MD;  Location: [facility];  Service: Gynecology;  Laterality: N/A;   Family History  Problem Relation Age of Onset  . Colon cancer Father 50  . Heart attack Father 58  . Prostate cancer Father        dx 67's  . Bladder Cancer Father 64  . Hypertension Father   . Hyperlipidemia Father   . Heart disease Father   . Obesity Father   . Stroke Mother 54  . Depression Mother   . Anxiety disorder Mother   . Bipolar  disorder Mother   . Obesity Mother   . Diabetes Maternal Grandfather   . Kidney disease Maternal Grandfather   . Asthma Brother   . Cancer Paternal Grandmother 58       GYN cancer ( thinks ovarian, maybe uterine)  . Heart attack Maternal Grandmother 70  . Breast cancer Other 77  . Colon cancer Other 30  . Cancer Other        type unk, age dx unk   Social History   Socioeconomic History  . Marital status: Married    Spouse name: Not on file  . Number of children: Not on file  . Years of education: Not on file  . Highest  education level: Not on file  Occupational History    Employer: [employer]    Comment: [employer]   Tobacco Use  . Smoking status: Never Smoker  . Smokeless tobacco: Never Used  Vaping Use  . Vaping Use: Never used  Substance and Sexual Activity  . Alcohol use: Not Currently    Alcohol/week: 0.0 standard drinks    Comment: <1/week wine or beer occasional  . Drug use: No  . Sexual activity: Yes    Birth control/protection: Post-menopausal  Other Topics Concern  . Not on file  Social History Narrative   2-3 caffeine drinks per day. Regular exercise.  Walking 3 miles a day.    Social Determinants of Health   Financial Resource Strain:   . Difficulty of Paying Living Expenses: Not on file  Food Insecurity:   . Worried About Charity fundraiser in the Last Year: Not on file  . Ran Out of Food in the Last Year: Not on file  Transportation Needs:   . Lack of Transportation (Medical): Not on file  . Lack of Transportation (Non-Medical): Not on file  Physical Activity:   . Days of Exercise per Week: Not on file  . Minutes of Exercise per Session: Not on file  Stress:   . Feeling of Stress : Not on file  Social Connections:   . Frequency of Communication with Friends and Family: Not on file  . Frequency of Social Gatherings with Friends and Family: Not on file  . Attends Religious Services: Not on file  . Active Member of  Clubs or Organizations: Not on file  . Attends Archivist Meetings: Not on file  . Marital Status: Not on file    Tobacco Counseling Counseling given: Not Answered   Clinical Intake:                 Diabetic?No   Activities of Daily Living In your present state of health, do you have any difficulty performing the following activities: 06/09/2020  Hearing? Y  Comment ear ringing bilat, documented hearing loss  Vision? N  Difficulty concentrating or making decisions? N  Walking or climbing stairs? N  Dressing or bathing? N  Doing errands, shopping? N  Preparing Food and eating ? N  Using the Toilet? N  Managing your Medications? N  Managing your Finances? N  Housekeeping or managing your Housekeeping? N  Some recent data might be hidden    Patient Care Team: [name], MD as PCP - General Excell Seltzer, MD (Inactive) as Consulting Physician (General Surgery) Truitt Merle, MD as Consulting Physician (Hematology) [name], MD as Attending Physician (Radiation Oncology)  Indicate any recent Medical Services you may have received from other than [facility] providers in the past year (date may be approximate).  Physical Exam Constitutional:      Appearance: She is well-developed.  HENT:     Head: Normocephalic and atraumatic.     Right Ear: External ear normal.     Left Ear: External ear normal.     Nose: Nose normal.  Eyes:     Conjunctiva/sclera: Conjunctivae normal.     Pupils: Pupils are equal, round, and reactive to light.  Neck:     Thyroid: No thyromegaly.     Vascular: No carotid bruit.  Cardiovascular:     Rate and Rhythm: Normal rate and regular rhythm.     Heart sounds: Normal heart sounds.  Pulmonary:  Effort: Pulmonary effort is normal.     Breath sounds: Normal breath sounds. No wheezing.  Musculoskeletal:     Cervical back: Neck supple.  Lymphadenopathy:     Cervical: No cervical adenopathy.  Skin:    General:  Skin is warm and dry.  Neurological:     Mental Status: She is alert and oriented to person, place, and time.        Assessment:   This is a routine wellness examination for Dorota.  Hearing/Vision screen No exam data present  Dietary issues and exercise activities discussed: Current Exercise Habits: Home exercise routine, Type of exercise: walking, Time (Minutes): 45, Frequency (Times/Week): 5, Weekly Exercise (Minutes/Week): 225, Intensity: Moderate  Goals   None    Depression Screen PHQ 2/9 Scores 06/08/2020 10/03/2019 01/04/2019 02/03/2018 01/13/2018 11/03/2017 08/04/2017  PHQ - 2 Score 0 2 0 0 0 0 0  PHQ- 9 Score - 11 - - - - -    Fall Risk Fall Risk  06/08/2020 02/03/2018 01/13/2018 11/03/2017 08/04/2017  Falls in the past year? 0 No No Yes No  Number falls in past yr: - - - 1 -  Injury with Fall? - - - No -  Risk for fall due to : No Fall Risks - - - -     ASSISTIVE DEVICES UTILIZED TO PREVENT FALLS:  Life alert? No  Use of a cane, walker or w/c? No  Grab bars in the bathroom? not asked Shower chair or bench in shower? No  Elevated toilet seat or a handicapped toilet? No   TIMED UP AND GO:  Was the test performed? No .    Gait steady and fast without use of assistive device  Cognitive Function:     6CIT Screen 06/08/2020  What Year? 0 points  What month? 0 points  What time? 0 points  Count back from 20 0 points  Months in reverse 0 points  Repeat phrase 2 points  Total Score 2    Immunizations Immunization History  Administered Date(s) Administered  . Fluad Quad(high Dose 65+) 06/15/2019  . Influenza Split 06/15/2012  . Influenza Whole 06/26/2009, 05/18/2013  . Influenza,inj,Quad PF,6+ Mos 06/02/2014, 06/04/2016, 06/02/2018  . Influenza-Unspecified 06/15/2017  . PFIZER SARS-COV-2 Vaccination 09/20/2019, 10/11/2019  . Pneumococcal Conjugate-13 06/29/2017  . Td 12/14/2008  . Tdap 03/25/2019  . Zoster 04/04/2011    TDAP status: Up to date Flu  Vaccine status: Declined, Education has been provided regarding the importance of this vaccine but patient still declined. Advised may receive this vaccine at local pharmacy or Health Dept. Aware to provide a copy of the vaccination record if obtained from local pharmacy or Health Dept. Verbalized acceptance and understanding. Pneumococcal vaccine status: Declined,  Education has been provided regarding the importance of this vaccine but patient still declined. Advised may receive this vaccine at local pharmacy or Health Dept. Aware to provide a copy of the vaccination record if obtained from local pharmacy or Health Dept. Verbalized acceptance and understanding.  Covid-19 vaccine status: Completed vaccines  Qualifies for Shingles Vaccine? Yes   Zostavax completed No   Shingrix Completed?: No.    Education has been provided regarding the importance of this vaccine. Patient has been advised to call insurance company to determine out of pocket expense if they have not yet received this vaccine. Advised may also receive vaccine at local pharmacy or Health Dept. Verbalized acceptance and understanding.  Screening Tests Health Maintenance  Topic Date Due  . PNA vac Low  Risk Adult (2 of 2 - PPSV23) 06/29/2018  . INFLUENZA VACCINE  12/13/2020 (Originally 04/15/2020)  . DEXA SCAN  04/28/2021  . MAMMOGRAM  05/02/2022  . COLONOSCOPY  11/21/2024  . TETANUS/TDAP  03/24/2029  . COVID-19 Vaccine  Completed  . Hepatitis C Screening  Completed    Health Maintenance  Health Maintenance Due  Topic Date Due  . PNA vac Low Risk Adult (2 of 2 - PPSV23) 06/29/2018    Colorectal cancer screening: Completed 3//2021. Repeat every 5 years Mammogram status: Completed 05/02/2020. Repeat every year Bone Density status: Completed  . Results reflect: Bone density results: OSTEOPENIA. Repeat every 3 years.  Lung Cancer Screening: (Low Dose CT Chest recommended if Age 53-80 years, 30 pack-year currently smoking OR have  quit w/in 15years.) does not qualify.   Lung Cancer Screening Referral: NA  Additional Screening:  Hepatitis C Screening: does not qualify; Completed Yes  Vision Screening: Recommended annual ophthalmology exams for early detection of glaucoma and other disorders of the eye. Is the patient up to date with their annual eye exam?  Yes  If pt is not established with a provider, would they like to be referred to a provider to establish care? No .   Dental Screening: Recommended annual dental exams for proper oral hygiene  Community Resource Referral / Chronic Care Management: CRR required this visit?  No   CCM required this visit?  No      Plan:     I have personally reviewed and noted the following in the patient's chart:   . Medical and social history . Use of alcohol, tobacco or illicit drugs  . Current medications and supplements . Functional ability and status . Nutritional status . Physical activity . Advanced directives . List of other physicians . Hospitalizations, surgeries, and ER visits in previous 12 months . Vitals . Screenings to include cognitive, depression, and falls . Referrals and appointments . She will get COVID booster and flu shot this month.  . Planning on getting shingles vaccine next month. Rx given.   . Plan on penumonia vaccine in 6 months.  . Plan for DEXA next year. Osteopenia on Boniva . Leg stiffness with LIvalo. Will change to Atorvastatin 10mg  and adjust upwards going forward.     In addition, I have reviewed and discussed with patient certain preventive protocols, quality metrics, and best practice recommendations. A written personalized care plan for preventive services as well as general preventive health recommendations were provided to patient.     Beatrice Lecher, MD   06/09/2020

## 2020-06-08 NOTE — Patient Instructions (Signed)
Preventive Care 68 Years and Older, Female Preventive care refers to lifestyle choices and visits with your health care provider that can promote health and wellness. This includes:  A yearly physical exam. This is also called an annual well check.  Regular dental and eye exams.  Immunizations.  Screening for certain conditions.  Healthy lifestyle choices, such as diet and exercise. What can I expect for my preventive care visit? Physical exam Your health care provider will check:  Height and weight. These may be used to calculate body mass index (BMI), which is a measurement that tells if you are at a healthy weight.  Heart rate and blood pressure.  Your skin for abnormal spots. Counseling Your health care provider may ask you questions about:  Alcohol, tobacco, and drug use.  Emotional well-being.  Home and relationship well-being.  Sexual activity.  Eating habits.  History of falls.  Memory and ability to understand (cognition).  Work and work Statistician.  Pregnancy and menstrual history. What immunizations do I need?  Influenza (flu) vaccine  This is recommended every year. Tetanus, diphtheria, and pertussis (Tdap) vaccine  You may need a Td booster every 10 years. Varicella (chickenpox) vaccine  You may need this vaccine if you have not already been vaccinated. Zoster (shingles) vaccine  You may need this after age 33. Pneumococcal conjugate (PCV13) vaccine  One dose is recommended after age 33. Pneumococcal polysaccharide (PPSV23) vaccine  One dose is recommended after age 72. Measles, mumps, and rubella (MMR) vaccine  You may need at least one dose of MMR if you were born in 1957 or later. You may also need a second dose. Meningococcal conjugate (MenACWY) vaccine  You may need this if you have certain conditions. Hepatitis A vaccine  You may need this if you have certain conditions or if you travel or work in places where you may be exposed  to hepatitis A. Hepatitis B vaccine  You may need this if you have certain conditions or if you travel or work in places where you may be exposed to hepatitis B. Haemophilus influenzae type b (Hib) vaccine  You may need this if you have certain conditions. You may receive vaccines as individual doses or as more than one vaccine together in one shot (combination vaccines). Talk with your health care provider about the risks and benefits of combination vaccines. What tests do I need? Blood tests  Lipid and cholesterol levels. These may be checked every 5 years, or more frequently depending on your overall health.  Hepatitis C test.  Hepatitis B test. Screening  Lung cancer screening. You may have this screening every year starting at age 39 if you have a 30-pack-year history of smoking and currently smoke or have quit within the past 15 years.  Colorectal cancer screening. All adults should have this screening starting at age 36 and continuing until age 15. Your health care provider may recommend screening at age 23 if you are at increased risk. You will have tests every 1-10 years, depending on your results and the type of screening test.  Diabetes screening. This is done by checking your blood sugar (glucose) after you have not eaten for a while (fasting). You may have this done every 1-3 years.  Mammogram. This may be done every 1-2 years. Talk with your health care provider about how often you should have regular mammograms.  BRCA-related cancer screening. This may be done if you have a family history of breast, ovarian, tubal, or peritoneal cancers.  Other tests  Sexually transmitted disease (STD) testing.  Bone density scan. This is done to screen for osteoporosis. You may have this done starting at age 44. Follow these instructions at home: Eating and drinking  Eat a diet that includes fresh fruits and vegetables, whole grains, lean protein, and low-fat dairy products. Limit  your intake of foods with high amounts of sugar, saturated fats, and salt.  Take vitamin and mineral supplements as recommended by your health care provider.  Do not drink alcohol if your health care provider tells you not to drink.  If you drink alcohol: ? Limit how much you have to 0-1 drink a day. ? Be aware of how much alcohol is in your drink. In the [place] one drink equals one 12 oz bottle of beer (355 mL), one 5 oz glass of wine (148 mL), or one 1 oz glass of hard liquor (44 mL). Lifestyle  Take daily care of your teeth and gums.  Stay active. Exercise for at least 30 minutes on 5 or more days each week.  Do not use any products that contain nicotine or tobacco, such as cigarettes, e-cigarettes, and chewing tobacco. If you need help quitting, ask your health care provider.  If you are sexually active, practice safe sex. Use a condom or other form of protection in order to prevent STIs (sexually transmitted infections).  Talk with your health care provider about taking a low-dose aspirin or statin. What's next?  Go to your health care provider once a year for a well check visit.  Ask your health care provider how often you should have your eyes and teeth checked.  Stay up to date on all vaccines. This information is not intended to replace advice given to you by your health care provider. Make sure you discuss any questions you have with your health care provider. Document Revised: 08/26/2018 Document Reviewed: 08/26/2018 Elsevier Patient Education  2020 [employer].

## 2020-06-09 ENCOUNTER — Encounter: Payer: Self-pay | Admitting: Family Medicine

## 2020-06-09 NOTE — Progress Notes (Signed)
Also c/o of dizziness almost like feeling "drunk". She doesn't feel like she is going to black out but feels off balance. Comes and goes.  Felt ok the last few days . Has been going on for months.  No ear pain.  Hx of chronic hearing loss.  Also has a cyst on dorsum of right hand. Very firm and tender but small. No directly over the joint.  Recommend schedule with Dr. Darene Lamer for [name] for further evaluation. Likely cyst on the tendon or synovial cyst.   Negative DIks Hallpike Maneuver today.    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 Given H.O on exercises to do on her own at home.      Aortic atherosclerosis - Will try to atorvastatin as her Livalo is very expensive. New rx sent to pharmacy.

## 2020-06-09 NOTE — Assessment & Plan Note (Signed)
continue chronic PPI

## 2020-06-12 ENCOUNTER — Ambulatory Visit: Payer: Medicare Other | Attending: Internal Medicine

## 2020-06-12 DIAGNOSIS — Z23 Encounter for immunization: Secondary | ICD-10-CM

## 2020-06-12 NOTE — Progress Notes (Signed)
   Covid-[facility] Vaccination [facility]  Name:  Dawn [name]    MRN: [id number] DOB: April 29, 1951  06/12/2020  [name] was observed post Covid-19 immunization for 15 minutes without incident. She was provided with Vaccine Information Sheet and instruction to access the V-Safe system.   [name] was instructed to call 911 with any severe reactions post vaccine: Marland Kitchen Difficulty breathing  . Swelling of face and throat  . A fast heartbeat  . A bad rash all over body  . Dizziness and weakness

## 2020-06-13 ENCOUNTER — Ambulatory Visit (INDEPENDENT_AMBULATORY_CARE_PROVIDER_SITE_OTHER): Payer: Medicare Other | Admitting: Physician Assistant

## 2020-06-16 ENCOUNTER — Other Ambulatory Visit: Payer: Self-pay | Admitting: Family Medicine

## 2020-06-20 ENCOUNTER — Ambulatory Visit (INDEPENDENT_AMBULATORY_CARE_PROVIDER_SITE_OTHER): Payer: Medicare Other

## 2020-06-20 ENCOUNTER — Ambulatory Visit (INDEPENDENT_AMBULATORY_CARE_PROVIDER_SITE_OTHER): Payer: Medicare Other | Admitting: Sports Medicine

## 2020-06-20 ENCOUNTER — Other Ambulatory Visit: Payer: Self-pay

## 2020-06-20 DIAGNOSIS — G620 Drug-induced polyneuropathy: Secondary | ICD-10-CM

## 2020-06-20 DIAGNOSIS — M79641 Pain in right hand: Secondary | ICD-10-CM

## 2020-06-20 DIAGNOSIS — M79644 Pain in right finger(s): Secondary | ICD-10-CM | POA: Diagnosis not present

## 2020-06-20 DIAGNOSIS — Z23 Encounter for immunization: Secondary | ICD-10-CM

## 2020-06-20 DIAGNOSIS — G629 Polyneuropathy, unspecified: Secondary | ICD-10-CM | POA: Insufficient documentation

## 2020-06-20 DIAGNOSIS — M19041 Primary osteoarthritis, right hand: Secondary | ICD-10-CM | POA: Diagnosis not present

## 2020-06-20 MED ORDER — ALPHA-LIPOIC ACID 600 MG PO CAPS: 1.0000 | ORAL_CAPSULE | Freq: Every day | ORAL | 11 refills | Status: DC

## 2020-06-20 MED ORDER — VITAMIN B-6 50 MG PO TABS: 50.0000 mg | ORAL_TABLET | Freq: Every day | ORAL | 3 refills | Status: DC

## 2020-06-20 NOTE — Assessment & Plan Note (Signed)
This pleasant 69 year old female has had right hand pain for some time now. On exam she has a bit of tenderness and a small ganglion at the base of her second metacarpal. She does have history of gastritis so we will avoid NSAIDs, Tylenol has not been effective so today we did an injection at the base of the second metacarpal. Adding some x-rays, return to see me in 1 month.

## 2020-06-20 NOTE — Assessment & Plan Note (Signed)
She also has numbness and tingling in both feet, all of her toes. This seems to of started after her chemotherapy which is a common etiologic agent. 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 She declines gabapentin. I will add vitamin B6 and alpha lipoic acid to start out with. Should she not get sufficient improvement we could certainly consider nerve conduction/EMG and the addition of Lyrica.

## 2020-06-20 NOTE — Progress Notes (Signed)
    Procedures performed today:    Procedure: Real-time Ultrasound Guided injection of a ganglion cyst at the left second carpometacarpal joint at the base of the second metacarpal Device: Samsung HS60  Verbal informed consent obtained.  Time-out conducted.  Noted no overlying erythema, induration, or other signs of local infection.  Skin prepped in a sterile fashion.  Local anesthesia: Topical Ethyl chloride.  With sterile technique and under real time ultrasound guidance:  1/2 cc lidocaine, 1/2 cc kenalog 40 injected easily  completed without difficulty  Pain immediately resolved suggesting accurate placement of the medication.  Advised to call if fevers/chills, erythema, induration, drainage, or persistent bleeding.  Images permanently stored and available for review in PACS.  Impression: Technically successful ultrasound guided injection.  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  Brief History, Exam, Impression, and Recommendations:    Right hand pain This pleasant 69 year old female has had right hand pain for some time now. On exam she has a bit of tenderness and a small ganglion at the base of her second metacarpal. She does have history of gastritis so we will avoid NSAIDs, Tylenol has not been effective so today we did an injection at the base of the second metacarpal. Adding some x-rays, return to see me in 1 month.  Peripheral neuropathy She also has numbness and tingling in both feet, all of her toes. This seems to of started after her chemotherapy which is a common etiologic agent. 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 She declines gabapentin. I will add vitamin  B6 and alpha lipoic acid to start out with. Should she not get sufficient improvement we could certainly consider nerve conduction/EMG and the addition of Lyrica.    ___________________________________________ Gwen Her. Dianah Field, M.D., ABFM., CAQSM. Primary Care and [place] Instructor of [place] [employer] [facility] [employer]

## 2020-06-28 ENCOUNTER — Other Ambulatory Visit: Payer: Self-pay | Admitting: *Deleted

## 2020-06-28 DIAGNOSIS — G620 Drug-induced polyneuropathy: Secondary | ICD-10-CM

## 2020-06-28 MED ORDER — ALPHA-LIPOIC ACID 600 MG PO CAPS: 1.0000 | ORAL_CAPSULE | Freq: Every day | ORAL | 3 refills | Status: AC

## 2020-06-28 MED ORDER — VITAMIN B-6 50 MG PO TABS: 50.0000 mg | ORAL_TABLET | Freq: Every day | ORAL | 3 refills | Status: AC

## 2020-07-18 ENCOUNTER — Ambulatory Visit (INDEPENDENT_AMBULATORY_CARE_PROVIDER_SITE_OTHER): Payer: Medicare Other | Admitting: Sports Medicine

## 2020-07-18 DIAGNOSIS — M79641 Pain in right hand: Secondary | ICD-10-CM | POA: Diagnosis not present

## 2020-07-18 DIAGNOSIS — M2011 Hallux valgus (acquired), right foot: Secondary | ICD-10-CM

## 2020-07-18 DIAGNOSIS — G620 Drug-induced polyneuropathy: Secondary | ICD-10-CM

## 2020-07-18 DIAGNOSIS — M2012 Hallux valgus (acquired), left foot: Secondary | ICD-10-CM

## 2020-07-18 NOTE — Assessment & Plan Note (Signed)
69 year old female returns, she is doing a lot better after an injection at a small ganglion at the base of her second metacarpal. It still present but no longer painful. Return as needed for this.

## 2020-07-18 NOTE — Progress Notes (Signed)
    Procedures performed today:    None.  Independent interpretation of notes and tests performed by another provider:   None.  Brief History, Exam, Impression, and Recommendations:    Peripheral neuropathy Mattea returns, she has numbness and tingling in both feet, all of her toes, this started after her chemotherapy. She does have some mild central canal stenosis from L4-S1. Declined gabapentin, she just now got her vitamin B6 and alpha lipoic acid, she will try this for about 8 weeks and if insufficient improvement we will consider Lyrica +/- nerve conduction/EMG.  Right hand pain 69 year old female returns, she is doing a lot better after an injection at a small ganglion at the base of her second metacarpal. It still present but no longer painful. Return as needed for this.  Hallux valgus, acquired, bilateral Bilateral hallux valgus. Minimally tender, I did recommend that she consider bunion spacers before any aggressive intervention. We showed her some examples on [place]. She will try this for a couple of months.    ___________________________________________ Gwen Her. Dianah Field, M.D., ABFM., CAQSM. Primary Care and [place] Instructor of [place] [employer] [facility] of [employer]

## 2020-07-18 NOTE — Assessment & Plan Note (Addendum)
Bilateral hallux valgus. Minimally tender, I did recommend that she consider bunion spacers before any aggressive intervention. We showed her some examples on [place]. She will try this for a couple of months.

## 2020-07-18 NOTE — Assessment & Plan Note (Signed)
Dawn [name] returns, she has numbness and tingling in both feet, all of her toes, this started after her chemotherapy. She does have some mild central canal stenosis from L4-S1. Declined gabapentin, she just now got her vitamin B6 and alpha lipoic acid, she will try this for about 8 weeks and if insufficient improvement we will consider Lyrica +/- nerve conduction/EMG.

## 2020-07-20 NOTE — Progress Notes (Signed)
Dawn [name]   [phone] [phone]   Clinic Follow up Note   Patient Care Team: [name], MD as PCP - Haskell Riling, MD (Inactive) as Consulting Physician (General Surgery) Truitt Merle, MD as Consulting Physician (Hematology) [name], MD as Attending Physician (Radiation Oncology)  Date of Service:  07/25/2020  CHIEF COMPLAINT: F/u on breast and endometrial cancers  SUMMARY OF ONCOLOGIC HISTORY: Oncology History Overview Note  MSI high Cancer Staging Endometrial cancer [facility]) Staging form: Corpus Uteri - Adenosarcoma, AJCC 8th Edition - Pathologic stage from 06/09/2017: Stage IIIC (pT1a, pN1, cM0) - Signed by Truitt Merle, MD on 06/16/2017  Malignant neoplasm of upper-inner quadrant of right breast in female, estrogen receptor positive ([place]) Staging form: Breast, AJCC 8th Edition - Clinical stage from 05/06/2017: Stage IA (cT1c, cN0, cM0, G2, ER: Positive, PR: Negative, HER2: Positive) - Unsigned - Pathologic stage from 05/25/2017: Stage IIA (pT2, pN0, cM0, G2, ER: Positive, PR: Negative, HER2: Negative) - Signed by Truitt Merle, MD on 06/16/2017     Malignant neoplasm of upper-inner quadrant of right breast in female, estrogen receptor positive ([place])  04/27/2017 Initial Biopsy   Diagnosis Breast, right, needle core biopsy INVASIVE DUCTAL CARCINOMA, GRADE 2 Microscopic Comment The neoplasm has intracellular mucin and signet ring cell features. Immunostains shows these cells are positive for ER,GATA3, ck7 and GCDFP, negative for ck20, cdx2, TTF-1 and pax8, The immunostaining pattern supports the neoplasm is breast primary.   04/27/2017 Receptors her2   Estrogen Receptor: 70%, POSITIVE, MODERATE STAINING INTENSITY Progesterone Receptor: 0%, NEGATIVE Proliferation Marker Ki67: 30% HER2 - **POSITIVE** RATIO OF HER2/CEP17 SIGNALS 2.91 AVERAGE HER2 COPY NUMBER PER CELL 7.28   04/27/2017 Initial Diagnosis   Malignant  neoplasm of upper-inner quadrant of right breast in female, estrogen receptor positive ([place])   05/07/2017 Imaging   CT Chest W Contrast 05/07/17 IMPRESSION: Tiny well-defined fatty lesion on the pleura at the right lung base. The thinner slice collimation used for today's chest CT eliminates volume-averaging seen in the lesion on the prior exam and confirms that this is a diffusely fatty nodule. This is a benign finding and likely represents a tiny lipoma. No defect in the hemidiaphragm evident to suggest tiny diaphragmatic hernia. Pulmonary hamartoma a consideration although the lack of soft tissue components makes this less likely.   05/15/2017 Genetic Testing   Patient had genetic testing due to a personal history of breast cancer and uterine cancer as well as a family history of cancer. The Multi-Cancer panel was ordered. The Multi-Cancer Panel offered by [facility] includes sequencing and/or deletion duplication testing of the following 83 genes: ALK, APC, ATM, AXIN2,BAP1,  BARD1, BLM, BMPR1A, BRCA1, BRCA2, BRIP1, CASR, CDC73, CDH1, CDK4, CDKN1B, CDKN1C, CDKN2A (p14ARF), CDKN2A (p16INK4a), CEBPA, CHEK2, CTNNA1, DICER1, DIS3L2, EGFR (c.2369C>T, p.Thr790Met variant only), EPCAM (Deletion/duplication testing only), FH, FLCN, GATA2, GPC3, GREM1 (Promoter region deletion/duplication testing only), HOXB13 (c.251G>A, p.Gly84Glu), HRAS, KIT, MAX, MEN1, MET, MITF (c.952G>A, p.Glu318Lys variant only), MLH1, MSH2, MSH3, MSH6, MUTYH, NBN, NF1, NF2, NTHL1, PALB2, PDGFRA, PHOX2B, PMS2, POLD1, POLE, POT1, PRKAR1A, PTCH1, PTEN, RAD50, RAD51C, RAD51D, RB1, RECQL4, RET, RUNX1, SDHAF2, SDHA (sequence changes only), SDHB, SDHC, SDHD, SMAD4, SMARCA4, SMARCB1, SMARCE1, STK11, SUFU, TERC, TERT, TMEM127, TP53, TSC1, TSC2, VHL, WRN and WT1.   Results: No pathogenic mutations were identified. A VUS in the GATA2 gene c.1348G>A (p.Gly450Arg) was identified.  The date of this test report is 05/25/2017.     05/25/2017 Surgery     RIGHT BREAST LUMPECTOMY WITH  RADIOACTIVE SEED AND  RIGHT AXILLARY SENTINEL LYMPH NODE BIOPSY and Pot placement by Dr. Excell Seltzer 05/25/17   05/25/2017 Pathology Results   Diagnosis 05/25/17 1. Breast, lumpectomy, right - INVASIVE DUCTAL CARCINOMA, GRADE II/III, SPANNING 2.2 CM. - DUCTAL CARCINOMA IN SITU, HIGH GRADE. - THE SURGICAL RESECTION MARGINS ARE NEGATIVE FOR CARCINOMA. - SEE ONCOLOGY TABLE BELOW. 2. Breast, excision, right additional superior margin - BENIGN FIBROADIPOSE TISSUE. - BENIGN SKELETAL MUSCLE. - SEE COMMENT. 3. Breast, excision, right additional lateral margin - BENIGN BREAST PARENCHYMA. - THERE IS NO EVIDENCE OF MALIGNANCY. - SEE COMMENT. 4. Breast, excision, chest wall margin - BENIGN FIBROADIPOSE TISSUE. - THERE IS NO EVIDENCE OF MALIGNANCY. - SEE COMMENT. 5. Lymph node, sentinel, biopsy, right axillary - THERE IS NO EVIDENCE OF CARCINOMA IN 1 OF 1 LYMPH NODE (0/1). 6. Lymph node, sentinel, biopsy, right axillary - THERE IS NO EVIDENCE OF CARCINOMA IN 1 OF 1 LYMPH NODE (0/1). 7. Lymph node, sentinel, biopsy, right axillary - THERE IS NO EVIDENCE OF CARCINOMA IN 1 OF 1 LYMPH NODE (0/1).   06/26/2017 PET scan   PET  IMPRESSION: 1. Postoperative findings both in the right breast and in the anatomic pelvis, with associated low-grade activity considered to be postoperative in nature. No hypermetabolic adenopathy or hypermetabolic lesions are identified to suggest active metastatic disease/malignancy. 2. Other imaging findings of potential clinical significance: Aortic Atherosclerosis (ICD10-I70.0). Sigmoid colon diverticulosis. Biapical pleuroparenchymal scarring in the lungs. Small amount of free pelvic fluid in the cul-de-sac, likely postoperative.    07/01/2017 - 10/14/2017 Chemotherapy   Adjuvant TCH with Onpro every 3 weeks for 6 cycles starting on 07/01/17, Changed Taxol to abraxane with cycle 4 due to drug rash reaction, followed by Herceptin every 3  weeks for 6 months    11/01/2017 - 12/08/2017 Radiation Therapy   Radiation therapy to her right breast with [name]   11/04/2017 - 06/2018 Chemotherapy   Maintenance Herceptin starting 11/04/17 and will comeplete her 12 months in 06/2018    12/23/2017 -  Anti-estrogen oral therapy   Adjuvant letrozole 2.5 mg daily started 12/23/17, switched to exemestane on 04/21/18 due to joint pain. Could not afford aromasin, started anastrozole 04/2018.    12/30/2017 Imaging   CT CAP W Contrast 12/30/17 IMPRESSION: Increased size of 1.1 cm aorto-caval retroperitoneal lymph node. This could be reactive due to interval hysterectomy, however metastatic carcinoma cannot be excluded. Consider continued attention on short-term follow-up CT, or PET-CT scan for further evaluation.  Mild hepatic steatosis.   01/25/2018 PET scan   IMPRESSION: 1. Enlarging hypermetabolic aortocaval lymph node is worrisome for metastatic disease. 2. Probable postoperative seroma in the medial right breast, with associated mild inflammatory hypermetabolism.   02/17/2018 - 02/08/2020 Antibody Plan   Keytruda every 3 weeks starting 02/17/18. Completed 2 years on 02/08/20   04/28/2018 Mammogram   Probably benign. Calcifications in the right breast most likely are dystrophic, related to previous surgery, and are probably benign.    04/28/2018 Imaging   DEXA Scan T Score -2.0   06/10/2018 Echocardiogram   06/10/2018 ECHO LV EF: 55% -   60%   08/28/2018 - 08/2018 Chemotherapy   Neratinib 64m for 1 week then titrate up with 1 additional tablet weekly up to 12 mg starting 08/28/18. Stopped soon after starting due to diarrhea.   10/25/2018 Imaging   CT CAP 10/25/18  IMPRESSION: 1. No evidence of metastatic disease. 2.  Aortic atherosclerosis (ICD10-170.0).   06/15/2019 Pathology Results   Diagnosis Breast, right,  needle core biopsy, 1 o'clock, 7cmfn - DENSE FIBROSIS CONSISTENT WITH PRIOR PROCEDURAL CHANGES. - NO MALIGNANCY  IDENTIFIED.   10/24/2019 Imaging   CT CAP W Contrast  IMPRESSION: 1. Stable exam. No evidence of recurrent or metastatic carcinoma within the chest, abdomen, or pelvis. 2. Colonic diverticulosis, without radiographic evidence of diverticulitis.   Aortic Atherosclerosis (ICD10-I70.0).   Endometrial adenocarcinoma ([place])  05/07/2017 Initial Diagnosis   Endometrial adenocarcinoma ([place])   06/09/2017 Surgery   XI ROBOTIC ASSISTED TOTAL LAPOROSCOPIC HYSTERECTOMY WITH BILATERAL SALPINGO OOPHORECTOMY and SENTINEL NODE BIOPSY by Dr. Denman George 06/09/17   06/09/2017 Pathology Results   Diagnosis 06/09/17 1. Lymph node, sentinel, biopsy, right external iliac - METASTATIC ADENOCARCINOMA IN ONE LYMPH NODE (1/1). 2. Lymph node, sentinel, biopsy, left obturator - ONE BENIGN LYMPH NODE (0/1). 3. Lymph node, sentinel, biopsy, left external iliac - METASTATIC ADENOCARCINOMA IN ONE LYMPH NODE (1/1). 4. Uterus +/- tubes/ovaries, neoplastic ENDOMETRIUM: - ENDOMETRIAL ADENOCARCINOMA, 3.2 CM. - CARCINOMA INVADES INNER HALF OF MYOMETRIUM. 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 - CERVIX, BILATERAL FALLOPIAN TUBES AND BILATERAL OVARIES FREE OF TUMOR   08/18/2017 - 09/17/2017 Radiation Therapy   vaginal brachytherapy per 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   02/17/2018 -  Chemotherapy   Keytruda every 3 weeks starting 02/17/18   04/11/2019 Imaging   CT AP W Contrast  IMPRESSION: 1. No evidence of recurrent or metastatic carcinoma within the abdomen or pelvis. 2. Colonic diverticulosis. No radiographic evidence of diverticulitis.     04/23/2020 Imaging   CT AP W contrast  IMPRESSION: 1. Stable exam. Specifically, no findings to suggest recurrent or metastatic disease. 2. Left colonic diverticulosis without diverticulitis. 3. Aortic Atherosclerosis (ICD10-I70.0).         CURRENT THERAPY:  -Letrozole 2.35m daily starting 12/23/17. Switched to Anastrozole in 04/2018 due to joint pain  INTERVAL HISTORY:  VMONTSERRATH MADDINGis here for a follow up and treatment. She presents to the clinic alone. She is doing well. She notes having more arthritis in her back. She will take Tylenol as needed. She notes she tries to walk often. She notes 3 weeks ago her PAC location turned blue and sore. It resolved on its own. She notes this is all manageable for her. She does see sports Medicine doctor for her back pain. She was started on B6 and alphalogic acid supplement for her neuropathy.  She is on anastrozole which she is otherwise tolerating. She has joint stiffness but tries to be active.     REVIEW OF SYSTEMS:   Constitutional: Denies fevers, chills or abnormal weight loss Eyes: Denies blurriness of vision Ears, nose, mouth, throat, and face: Denies mucositis or sore throat Respiratory: Denies cough, dyspnea or wheezes Cardiovascular: Denies palpitation, chest discomfort or lower extremity swelling Gastrointestinal:  Denies nausea, heartburn or change in bowel habits Skin: Denies abnormal skin rashes MSK: (+) Arthritis in her back (+) joint stiffness  Lymphatics: Denies new lymphadenopathy or easy bruising Neurological:Denies numbness, tingling or new weaknesses Behavioral/Psych: Mood is stable, no new changes  All other systems were reviewed with the patient and are negative.  MEDICAL HISTORY:  Past Medical History:  Diagnosis Date  . Anemia    as a child.  . Arthritis   . Bilateral cataracts   . Bilateral leg cramps   . [place]    skin  . Dyspnea   .  Endometrial cancer ([place])   . Family history of bladder cancer   . Family history of colon cancer in father   . Fatty liver   . Fuchs' corneal dystrophy   . GERD (gastroesophageal reflux disease)   . Hearing loss    Right side 30%  . Heartburn   . History of hiatal hernia    small size  . History  of radiation therapy 08/21/17, 08/28/17,09/01/17, 09/11/17, 09/17/17   Vaginal cuff brachytherapy.   Marland Kitchen History of radiation therapy 11/11/17- 12/08/17   Right Breast treated to 40.05 Gy with 15 fx of 2.67 Gy and a boost of 10 Gy with 5 fx.   . Hyperlipidemia   . Hypertension   . Hypothyroidism   . IBS (irritable bowel syndrome)    hx of  . Malignant neoplasm of upper-inner quadrant of right female breast ([place]) 04/2017  . NAFL (nonalcoholic fatty liver)   . Obesity   . Osteoarthritis   . Osteopenia   . PONV (postoperative nausea and vomiting)   . 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 Uterine cancer ([place]) 04/2017   endometrial cancer  . Varices, gastric   . Vertigo     SURGICAL HISTORY: Past Surgical History:  Procedure Laterality Date  . BREAST BIOPSY Right 04/27/2017  . BREAST LUMPECTOMY WITH RADIOACTIVE SEED AND SENTINEL LYMPH NODE BIOPSY Right 05/25/2017   Procedure: RIGHT BREAST LUMPECTOMY WITH RADIOACTIVE SEED AND  RIGHT AXILLARY SENTINEL LYMPH NODE BIOPSY;  Surgeon: Excell Seltzer, MD;  Location: [facility];  Service: General;  Laterality: Right;  . CATARACT EXTRACTION, BILATERAL    . COLONOSCOPY    . HYSTEROSCOPY WITH D & C N/A 04/29/2017   Procedure: DILATATION AND CURETTAGE /HYSTEROSCOPY;  Surgeon: Linda Hedges, DO;  Location: [facility];  Service: Gynecology;  Laterality: N/A;  . PORTACATH PLACEMENT Left 05/25/2017   Procedure: INSERTION PORT-A-CATH;  Surgeon: Excell Seltzer, MD;  Location: [facility];  Service: General;  Laterality: Left;  . ROBOTIC ASSISTED TOTAL HYSTERECTOMY WITH BILATERAL SALPINGO OOPHERECTOMY N/A 06/09/2017   Procedure: XI ROBOTIC ASSISTED TOTAL LAPOROSCOPIC HYSTERECTOMY WITH BILATERAL SALPINGO OOPHORECTOMY;  Surgeon: Everitt Amber, MD;  Location: [facility];  Service: Gynecology;  Laterality: N/A;  . SENTINEL NODE BIOPSY N/A 06/09/2017   Procedure: SENTINEL NODE BIOPSY;  Surgeon: Everitt Amber, MD;   Location: [facility];  Service: Gynecology;  Laterality: N/A;    I have reviewed the social history and family history with the patient and they are unchanged from previous note.  ALLERGIES:  is allergic to ciprofloxacin, paclitaxel, crestor [rosuvastatin calcium], fosamax [alendronate sodium], livalo [pitavastatin], and penicillins.  MEDICATIONS:  Current Outpatient Medications  Medication Sig Dispense Refill  . acetaminophen (TYLENOL) 500 MG tablet Take 500 mg by mouth every 6 (six) hours as needed for moderate pain.    . Alpha-Lipoic Acid 600 MG CAPS Take 1 capsule (600 mg total) by mouth daily. 90 capsule 3  . AMBULATORY NON FORMULARY MEDICATION Medication Name: Shingrix IM x 1.  Repeat in 2-6 months. 1 Units 0  . anastrozole (ARIMIDEX) 1 MG tablet TAKE 1 TABLET BY MOUTH  DAILY 90 tablet 3  . Ascorbic Acid (VITAMIN C) 1000 MG tablet Take 1,000 mg by mouth daily.    Marland Kitchen atorvastatin (LIPITOR) 10 MG tablet Take 1 tablet (10 mg total) by mouth daily. 90 tablet 1  . b complex vitamins capsule Take 1 capsule by mouth daily.    . Cholecalciferol (VITAMIN  D3) 75 MCG (3000 UT) TABS Take 1 capsule by mouth daily.    . diphenhydrAMINE (BENADRYL) 25 MG tablet Take 25-50 mg by mouth every 6 (six) hours as needed for sleep.    . fluticasone (FLONASE) 50 MCG/ACT nasal spray Place 2 sprays into both nostrils daily.    Marland Kitchen gabapentin (NEURONTIN) 100 MG capsule Take 1 capsule (100 mg total) by mouth 3 (three) times daily. 90 capsule 1  . ibandronate (BONIVA) 150 MG tablet TAKE 1 TABLET BY MOUTH  EVERY 30 DAYS 3 tablet 3  . levothyroxine (SYNTHROID) 50 MCG tablet TAKE 1 TABLET BY MOUTH  DAILY 90 tablet 3  . lidocaine-prilocaine (EMLA) cream APPLY TO AFFECTED AREA ONCE 30 g 2  . Multiple Vitamin (MULTIVITAMIN) capsule Take 1 capsule by mouth daily.      Marland Kitchen olmesartan-hydrochlorothiazide (BENICAR HCT) 20-12.5 MG tablet TAKE 1 TABLET BY MOUTH  DAILY 90 tablet 3  . pantoprazole (PROTONIX) 20 MG tablet Take 1 tablet  (20 mg total) by mouth daily. 90 tablet 3  . potassium chloride SA (KLOR-CON) 20 MEQ tablet TAKE 1 TABLET BY MOUTH  DAILY 90 tablet 3  . pyridOXINE (VITAMIN B-6) 50 MG tablet Take 1 tablet (50 mg total) by mouth daily. 90 tablet 3  . sodium chloride (MURO 128) 5 % ophthalmic solution Place 2 drops into both eyes daily.    Marland Kitchen triamcinolone cream (KENALOG) 0.1 % APPLY TO ITCHY SPOTS ONCE DAILY  1  . triamcinolone lotion (KENALOG) 0.1 % Apply 1 application topically 3 (three) times daily. 180 mL 2   No current facility-administered medications for this visit.   Facility-Administered Medications Ordered in Other Visits  Medication Dose Route Frequency Provider Last Rate Last Admin  . sodium chloride flush (NS) 0.9 % injection 10 mL  10 mL Intracatheter PRN Truitt Merle, MD   10 mL at 08/25/18 1111    PHYSICAL EXAMINATION: ECOG PERFORMANCE STATUS: 0 - Asymptomatic  Vitals:   07/25/20 0941  BP: 111/68  Pulse: 72  Resp: 18  Temp: (!) 96.3 F (35.7 C)  SpO2: 98%   Filed Weights   07/25/20 0941  Weight: 171 lb 4.8 oz (77.7 kg)    GENERAL:alert, no distress and comfortable SKIN: skin color, texture, turgor are normal, no rashes or significant lesions (+) skin ecchymosis at left breast and under PAC.  EYES: normal, Conjunctiva are pink and non-injected, sclera clear  NECK: supple, thyroid normal size, non-tender, without nodularity LYMPH:  no palpable lymphadenopathy in the cervical, axillary  LUNGS: clear to auscultation and percussion with normal breathing effort HEART: regular rate & rhythm and no murmurs and no lower extremity edema ABDOMEN:abdomen soft, non-tender and normal bowel sounds Musculoskeletal:no cyanosis of digits and no clubbing  NEURO: alert & oriented x 3 with fluent speech, no focal motor/sensory deficits BREAST: S/p right lumpectomy: surgical incision healed well with 1cm nodule above her nipple in 1-2:00 position.   LABORATORY DATA:  I have reviewed the data as  listed CBC Latest Ref Rng & Units 07/25/2020 04/23/2020 01/18/2020  WBC 4.0 - 10.5 K/uL 4.1 4.3 4.3  Hemoglobin 12.0 - 15.0 g/dL 13.9 13.5 13.8  Hematocrit 36 - 46 % 38.7 38.5 39.7  Platelets 150 - 400 K/uL 152 148(L) 156     CMP Latest Ref Rng & Units 07/25/2020 04/23/2020 01/18/2020  Glucose 70 - 99 mg/dL 95 109(H) 97  BUN 8 - 23 mg/dL _0 Creatinine 0.44 - 1.00 mg/dL 0.80 0.83 0.76  Sodium  135 - 145 mmol/L 138 138 139  Potassium 3.5 - 5.1 mmol/L 3.7 3.7 3.7  Chloride 98 - 111 mmol/L 101 103 103  CO2 22 - 32 mmol/L _0 Calcium 8.9 - 10.3 mg/dL 9.2 9.7 9.2  Total Protein 6.5 - 8.1 g/dL 7.3 7.4 7.3  Total Bilirubin 0.3 - 1.2 mg/dL 0.8 0.7 0.6  Alkaline Phos 38 - 126 U/L 38 43 45  AST 15 - 41 U/L 23 27 39  ALT 0 - 44 U/L 31 42 59(H)      RADIOGRAPHIC STUDIES: I have personally reviewed the radiological images as listed and agreed with the findings in the report. No results found.   ASSESSMENT & PLAN:  Dawn [name] is a 69 y.o. female with    1. 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 Treated with right breast lumpectomy, adjuvant chemo and radiation.She tried Nerlynx but could not tolerate due to diarrhea,and does not want try again. -She started anti-estrogen therapy with letrozole. Due to joint pain I switched her to Anastrozole in 04/2018.She is tolerating well with manageable joint stiffness.  -She is clinically doing well. Lab reviewed, her CBC and CMP are within normal limits. Her physical exam showed stable nodule at breast incision and her 04/2020 mammogram was unremarkable. There is no clinical concern for recurrence. -Continue surveillance. Next MIRI breast in 10/2020 -Continue anastrozole -f/u in 3 months    2. Endometrial adenocarcinomawith lymph node metastasis, pT1apN1M0, FIGO stage IIIc, MSI-H, probable RP node recurrence in 12/2017 -Diagnosed in  04/2017. Treated with hysterectomy, BSO, adjuvant radiation, and chemo with Carbotaxol. -She has completed 2 years of Keytruda in May 2021. She has recovered well.  -We discussed her CT AP from 04/23/20 which showed stable exam with no findings to suggest recurrent or metastatic disease.  -Continue with surveillance. Will repeat CT scan in 10/2020 -Continue PAC flush every 6-8 weeks. She is fine with keeping longer given she has difficult veins.   3. HTN, Acid reflux, High Cholesterol -WellControlled, Continue tof/u with PCP -She was seen to have Gastritis on 11/22/19 upper endoscopy/colonoscopy. She is on PPI.    4.Osteopenia -DEXA scan in 05/2018 revealed T score of -2.0. -She is on monthly boniva, prescribed by her PCP, since 02/2018.Continue -ContinueVitamin D and Calcium supplements.Will check her Vitamin D level every 6 months.  -She is due for DEXA. If she has no improved on boniva, I would recommend Zometa infusion q74month for 2 years which can also reduce her risk of future bone metastasis.   5.Metabolic syndrome, hyperglycemia, Weight gain  -She has no history of diabetes. Her random blood glucose was 213.  -She was recently found to have abnormal TSH levels. She has been seen by endocrinologist and has startedlevothyroxineon 01/04/19. Will monitor TSH level.  -Her weight was trending up. [facility] some weight. Will continue.  -Continue to follow-up with her primary care physician  6. Arthritis, Neuropathy  -She has arthritis, mainly in her back. She will take Tylenol as needed.  -Managed by Sports Medicine Dr. TDianah Field-She was started on B6 and alphalogic acid supplement for her neuropathy.    Plan -DEXA in the next 3 months by her PCP -Port flush in 6 weeks  -f/u in 3 months with lab, flush, CT CAP w contrast a few days before.  -Continue anastrozole   No  problem-specific  Assessment & Plan notes found for this encounter.   Orders Placed This Encounter  Procedures  . CT CHEST ABDOMEN PELVIS W CONTRAST    Standing Status:   Future    Standing Expiration Date:   07/25/2021    Order Specific Question:   If indicated for the ordered procedure, I authorize the administration of contrast media per Radiology protocol    Answer:   Yes    Order Specific Question:   Preferred imaging location?    Answer:   [facility]    Order Specific Question:   Release to patient    Answer:   Immediate    Order Specific Question:   Is Oral Contrast requested for this exam?    Answer:   Yes, Per Radiology protocol    Order Specific Question:   Reason for Exam (SYMPTOM  OR DIAGNOSIS REQUIRED)    Answer:   r/u recurrence   All questions were answered. The patient knows to call the clinic with any problems, questions or concerns. No barriers to learning was detected. The total time spent in the appointment was 30 minutes.     Truitt Merle, MD 07/25/2020   I, Joslyn Devon, am  acting as scribe for Truitt Merle, MD.   I have reviewed the above documentation for accuracy and completeness, and I agree with the above.

## 2020-07-25 ENCOUNTER — Encounter: Payer: Self-pay | Admitting: Hematology

## 2020-07-25 ENCOUNTER — Telehealth: Payer: Self-pay | Admitting: Hematology

## 2020-07-25 ENCOUNTER — Inpatient Hospital Stay: Payer: Medicare Other

## 2020-07-25 ENCOUNTER — Other Ambulatory Visit: Payer: Self-pay

## 2020-07-25 ENCOUNTER — Inpatient Hospital Stay: Payer: Medicare Other | Attending: Hematology | Admitting: Hematology

## 2020-07-25 VITALS — BP 111/68 | HR 72 | Temp 96.3°F | Resp 18 | Ht 63.0 in | Wt 171.3 lb

## 2020-07-25 DIAGNOSIS — Z79899 Other long term (current) drug therapy: Secondary | ICD-10-CM | POA: Diagnosis not present

## 2020-07-25 DIAGNOSIS — C50211 Malignant neoplasm of upper-inner quadrant of right female breast: Secondary | ICD-10-CM | POA: Diagnosis not present

## 2020-07-25 DIAGNOSIS — E7800 Pure hypercholesterolemia, unspecified: Secondary | ICD-10-CM | POA: Insufficient documentation

## 2020-07-25 DIAGNOSIS — Z79811 Long term (current) use of aromatase inhibitors: Secondary | ICD-10-CM | POA: Insufficient documentation

## 2020-07-25 DIAGNOSIS — Z170 Estrogen receptor positive status [ER+]: Secondary | ICD-10-CM | POA: Insufficient documentation

## 2020-07-25 DIAGNOSIS — M810 Age-related osteoporosis without current pathological fracture: Secondary | ICD-10-CM | POA: Diagnosis not present

## 2020-07-25 DIAGNOSIS — E8881 Metabolic syndrome: Secondary | ICD-10-CM | POA: Diagnosis not present

## 2020-07-25 DIAGNOSIS — C541 Malignant neoplasm of endometrium: Secondary | ICD-10-CM

## 2020-07-25 DIAGNOSIS — Z923 Personal history of irradiation: Secondary | ICD-10-CM | POA: Insufficient documentation

## 2020-07-25 DIAGNOSIS — Z95828 Presence of other vascular implants and grafts: Secondary | ICD-10-CM

## 2020-07-25 DIAGNOSIS — M1990 Unspecified osteoarthritis, unspecified site: Secondary | ICD-10-CM | POA: Insufficient documentation

## 2020-07-25 DIAGNOSIS — M8580 Other specified disorders of bone density and structure, unspecified site: Secondary | ICD-10-CM | POA: Insufficient documentation

## 2020-07-25 DIAGNOSIS — M2560 Stiffness of unspecified joint, not elsewhere classified: Secondary | ICD-10-CM | POA: Diagnosis not present

## 2020-07-25 DIAGNOSIS — K219 Gastro-esophageal reflux disease without esophagitis: Secondary | ICD-10-CM | POA: Diagnosis not present

## 2020-07-25 DIAGNOSIS — G629 Polyneuropathy, unspecified: Secondary | ICD-10-CM | POA: Diagnosis not present

## 2020-07-25 DIAGNOSIS — R946 Abnormal results of thyroid function studies: Secondary | ICD-10-CM | POA: Diagnosis not present

## 2020-07-25 DIAGNOSIS — Z90710 Acquired absence of both cervix and uterus: Secondary | ICD-10-CM | POA: Diagnosis not present

## 2020-07-25 DIAGNOSIS — Z8542 Personal history of malignant neoplasm of other parts of uterus: Secondary | ICD-10-CM | POA: Insufficient documentation

## 2020-07-25 DIAGNOSIS — Z8719 Personal history of other diseases of the digestive system: Secondary | ICD-10-CM | POA: Insufficient documentation

## 2020-07-25 DIAGNOSIS — I10 Essential (primary) hypertension: Secondary | ICD-10-CM | POA: Diagnosis not present

## 2020-07-25 DIAGNOSIS — Z9221 Personal history of antineoplastic chemotherapy: Secondary | ICD-10-CM | POA: Insufficient documentation

## 2020-07-25 LAB — CBC WITH DIFFERENTIAL/PLATELET
Abs Immature Granulocytes: 0.01 10*3/uL (ref 0.00–0.07)
Basophils Absolute: 0 10*3/uL (ref 0.0–0.1)
Basophils Relative: 1 %
Eosinophils Absolute: 0.1 10*3/uL (ref 0.0–0.5)
Eosinophils Relative: 2 %
HCT: 38.7 % (ref 36.0–46.0)
Hemoglobin: 13.9 g/dL (ref 12.0–15.0)
Immature Granulocytes: 0 %
Lymphocytes Relative: 45 %
Lymphs Abs: 1.9 10*3/uL (ref 0.7–4.0)
MCH: 34.8 pg — ABNORMAL HIGH (ref 26.0–34.0)
MCHC: 35.9 g/dL (ref 30.0–36.0)
MCV: 97 fL (ref 80.0–100.0)
Monocytes Absolute: 0.5 10*3/uL (ref 0.1–1.0)
Monocytes Relative: 11 %
Neutro Abs: 1.7 10*3/uL (ref 1.7–7.7)
Neutrophils Relative %: 41 %
Platelets: 152 10*3/uL (ref 150–400)
RBC: 3.99 MIL/uL (ref 3.87–5.11)
RDW: 12 % (ref 11.5–15.5)
WBC: 4.1 10*3/uL (ref 4.0–10.5)
nRBC: 0 % (ref 0.0–0.2)

## 2020-07-25 LAB — COMPREHENSIVE METABOLIC PANEL
ALT: 31 U/L (ref 0–44)
AST: 23 U/L (ref 15–41)
Albumin: 3.9 g/dL (ref 3.5–5.0)
Alkaline Phosphatase: 38 U/L (ref 38–126)
Anion gap: 7 (ref 5–15)
BUN: 13 mg/dL (ref 8–23)
CO2: 30 mmol/L (ref 22–32)
Calcium: 9.2 mg/dL (ref 8.9–10.3)
Chloride: 101 mmol/L (ref 98–111)
Creatinine, Ser: 0.8 mg/dL (ref 0.44–1.00)
GFR, Estimated: >60 mL/min (ref >60)
Glucose, Bld: 95 mg/dL (ref 70–99)
Potassium: 3.7 mmol/L (ref 3.5–5.1)
Sodium: 138 mmol/L (ref 135–145)
Total Bilirubin: 0.8 mg/dL (ref 0.3–1.2)
Total Protein: 7.3 g/dL (ref 6.5–8.1)

## 2020-07-25 LAB — VITAMIN D 25 HYDROXY (VIT D DEFICIENCY, FRACTURES): Vit D, 25-Hydroxy: 45.82 ng/mL (ref 30–100)

## 2020-07-25 LAB — TSH: TSH: 1.744 u[IU]/mL (ref 0.308–3.960)

## 2020-07-25 MED ORDER — HEPARIN SOD (PORK) LOCK FLUSH 100 UNIT/ML IV SOLN
500.0000 [IU] | Freq: Once | INTRAVENOUS | Status: AC | PRN
  Administered 2020-07-25: 500 [IU] via INTRAVENOUS
  Filled 2020-07-25: qty 5

## 2020-07-25 MED ORDER — SODIUM CHLORIDE 0.9% FLUSH
10.0000 mL | INTRAVENOUS | Status: DC | PRN
  Administered 2020-07-25: 10 mL via INTRAVENOUS
  Filled 2020-07-25: qty 10

## 2020-07-25 NOTE — Patient Instructions (Signed)

## 2020-07-25 NOTE — Telephone Encounter (Signed)
Scheduled per 11/10 los. Printed avs and calendar for pt.  

## 2020-07-28 ENCOUNTER — Other Ambulatory Visit: Payer: Self-pay | Admitting: Hematology

## 2020-08-01 ENCOUNTER — Encounter (INDEPENDENT_AMBULATORY_CARE_PROVIDER_SITE_OTHER): Payer: Self-pay | Admitting: Physician Assistant

## 2020-08-01 ENCOUNTER — Telehealth: Payer: Self-pay | Admitting: Family Medicine

## 2020-08-01 ENCOUNTER — Ambulatory Visit (INDEPENDENT_AMBULATORY_CARE_PROVIDER_SITE_OTHER): Payer: Medicare Other | Admitting: Physician Assistant

## 2020-08-01 ENCOUNTER — Other Ambulatory Visit: Payer: Self-pay

## 2020-08-01 VITALS — BP 108/70 | HR 82 | Temp 98.0°F | Ht 63.0 in | Wt 171.0 lb

## 2020-08-01 DIAGNOSIS — E559 Vitamin D deficiency, unspecified: Secondary | ICD-10-CM

## 2020-08-01 DIAGNOSIS — E669 Obesity, unspecified: Secondary | ICD-10-CM | POA: Diagnosis not present

## 2020-08-01 DIAGNOSIS — E038 Other specified hypothyroidism: Secondary | ICD-10-CM

## 2020-08-01 DIAGNOSIS — Z6830 Body mass index (BMI) 30.0-30.9, adult: Secondary | ICD-10-CM | POA: Diagnosis not present

## 2020-08-01 DIAGNOSIS — M8589 Other specified disorders of bone density and structure, multiple sites: Secondary | ICD-10-CM | POA: Diagnosis not present

## 2020-08-01 NOTE — Telephone Encounter (Signed)
-----   Message from Truitt Merle, MD sent at 06/10/2020  9:06 PM EDT ----- She had early stage breast cancer and probable recurrent endometrial cancer, but currently NED. If she still has cervicis, she can continue PAP smears as other people.   Thanks  Krista Blue  ----- Message ----- From: [name], MD Sent: 06/09/2020   2:20 PM EDT To: Truitt Merle, MD  Hi [name], how long do you recommend Sunya continue to get pap smears with her history?  Thank you  Beatrice Lecher MD

## 2020-08-06 NOTE — Progress Notes (Signed)
Chief Complaint:   OBESITY Dawn [name] is here to discuss her progress with her obesity treatment plan along with follow-up of her obesity related diagnoses. Dawn [name] is keeping a food journal and adhering to recommended goals of 1200 calories and 80 grams of  protein and states she is following her eating plan approximately 75% of the time. Dawn [name] states she is walking 3 miles 7 times per week.  Today's visit was #: 13 Starting weight: 190 lbs Starting date: 10/03/2019 Today's weight: 171 lbs Today's date: 08/01/2020 Total lbs lost to date: 19 Total lbs lost since last in-office visit: 3  Interim History: Dawn [name] is walking daily. She is journaling daily and getting between 1200-1400 calories and at least 80 grams of protein daily. She is concerned about holiday eating and wants to talk about strategies today.  Subjective:   Vitamin D deficiency. Last Vitamin D level was checked by her PCP and was 45. Ashey is on Vitamin D 3,000 units daily.   Ref. Range 07/25/2020 09:28  Vitamin D, 25-Hydroxy Latest Ref Range: 30 - 100 ng/mL 45.82   Other specified hypothyroidism. Thyroid level was recently checked by PCP and was within the normal range. Dawn [name] is on levothyroxine. She denies excessive fatigue.   Lab Results  Component Value Date   TSH 1.744 07/25/2020   Assessment/Plan:   Vitamin D deficiency. Low Vitamin D level contributes to fatigue and are associated with obesity, breast, and colon cancer. She will increase OTC Vitamin D to 4,000 units daily and will follow-up for routine testing of Vitamin D, at least 2-3 times per year to avoid over-replacement.  Other specified hypothyroidism. Patient with long-standing hypothyroidism, on levothyroxine therapy. She appears euthyroid. Orders and follow up as documented in patient record. Dawn [name] will follow-up with her PCP as scheduled.  Counseling . Good thyroid control is important for overall health. Supratherapeutic thyroid  levels are dangerous and will not improve weight loss results. . The correct way to take levothyroxine is fasting, with water, separated by at least 30 minutes from breakfast, and separated by more than 4 hours from calcium, iron, multivitamins, acid reflux medications (PPIs).   Class 1 obesity with serious comorbidity and body mass index (BMI) of 30.0 to 30.9 in adult, unspecified obesity type.  Dawn [name] is currently in the action stage of change. As such, her goal is to continue with weight loss efforts. She has agreed to keeping a food journal and adhering to recommended goals of 1200 calories and 80 grams of protein daily.   Exercise goals: Older adults should follow the adult guidelines. When older adults cannot meet the adult guidelines, they should be as physically active as their abilities and conditions will allow.   Behavioral modification strategies: meal planning and cooking strategies and holiday eating strategies .  Dawn [name] has agreed to follow-up with our clinic in 4 weeks. She was informed of the importance of frequent follow-up visits to maximize her success with intensive lifestyle modifications for her multiple health conditions.   Objective:   Blood pressure 108/70, pulse 82, temperature 98 F (36.7 C), temperature source Oral, height 5\' 3"  (1.6 m), weight 171 lb (77.6 kg), SpO2 98 %. Body mass index is 30.29 kg/m.  General: Cooperative, alert, well developed, in no acute distress. HEENT: Conjunctivae and lids unremarkable. Cardiovascular: Regular rhythm.  Lungs: Normal work of breathing. Neurologic: No focal deficits.   Lab Results  Component Value Date   CREATININE 0.80 07/25/2020   BUN 13 07/25/2020  NA 138 07/25/2020   K 3.7 07/25/2020   CL 101 07/25/2020   CO2 30 07/25/2020   Lab Results  Component Value Date   ALT 31 07/25/2020   AST 23 07/25/2020   ALKPHOS 38 07/25/2020   BILITOT 0.8 07/25/2020   Lab Results  Component Value Date   HGBA1C 5.5  04/30/2020   HGBA1C 5.4 10/03/2019   HGBA1C 5.4 02/09/2019   HGBA1C 5.5 06/07/2018   HGBA1C 5.2 11/19/2017   Lab Results  Component Value Date   INSULIN 26.4 (H) 04/30/2020   INSULIN 24.0 10/03/2019   Lab Results  Component Value Date   TSH 1.744 07/25/2020   Lab Results  Component Value Date   CHOL 256 (H) 04/30/2020   HDL 56 04/30/2020   LDLCALC 173 (H) 04/30/2020   TRIG 149 04/30/2020   CHOLHDL 4.6 (H) 04/30/2020   Lab Results  Component Value Date   WBC 4.1 07/25/2020   HGB 13.9 07/25/2020   HCT 38.7 07/25/2020   MCV 97.0 07/25/2020   PLT 152 07/25/2020   Lab Results  Component Value Date   FERRITIN 176 12/17/2012   Obesity Behavioral Intervention:   Approximately 15 minutes were spent on the discussion below.  ASK: We discussed the diagnosis of obesity with Dawn [name] today and Dawn [name] agreed to give Dawn [name] permission to discuss obesity behavioral modification therapy today.  ASSESS: Dawn [name] has the diagnosis of obesity and her BMI today is 30.4. Dawn [name] is in the action stage of change.   ADVISE: Dawn [name] was educated on the multiple health risks of obesity as well as the benefit of weight loss to improve her health. She was advised of the need for long term treatment and the importance of lifestyle modifications to improve her current health and to decrease her risk of future health problems.  AGREE: Multiple dietary modification options and treatment options were discussed and Dawn [name] agreed to follow the recommendations documented in the above note.  ARRANGE: Dawn [name] was educated on the importance of frequent visits to treat obesity as outlined per CMS and USPSTF guidelines and agreed to schedule her next follow up appointment today.  Attestation Statements:   Reviewed by clinician on day of visit: allergies, medications, problem list, medical history, surgical history, family history, social history, and previous encounter notes.  IMichaelene [name], am acting as  transcriptionist for Dawn Potash, PA-C   I have reviewed the above documentation for accuracy and completeness, and I agree with the above. Dawn Potash, PA-C

## 2020-08-08 ENCOUNTER — Telehealth: Payer: Self-pay

## 2020-08-08 NOTE — Telephone Encounter (Signed)
I left a vm requesting Ms Silman return my call to review bone density scan results. Per Dr Burr Medico she has worsening osteopenia.  Dr Burr Medico recommends changing from [facility] to [facility].  Zometa given every 6 months for 2 years.

## 2020-08-13 ENCOUNTER — Telehealth: Payer: Self-pay

## 2020-08-13 NOTE — Telephone Encounter (Signed)
Dawn [name] returned my call regarding her bone density scan results.  I reviewed Dr Ernestina Penna comments and recommendations.  She has worsening osteopenia and Dr Burr Medico recommends chagning from [facility] to Zometa every 6 months for 2 years.  Dawn [name] is interested in switching to zometa.  She would not want her first dose until after the new year.  Dr Burr Medico informed.

## 2020-08-17 ENCOUNTER — Other Ambulatory Visit (INDEPENDENT_AMBULATORY_CARE_PROVIDER_SITE_OTHER): Payer: Self-pay | Admitting: Family Medicine

## 2020-08-17 ENCOUNTER — Other Ambulatory Visit: Payer: Self-pay | Admitting: Hematology

## 2020-08-17 DIAGNOSIS — Z170 Estrogen receptor positive status [ER+]: Secondary | ICD-10-CM

## 2020-08-17 DIAGNOSIS — Z6830 Body mass index (BMI) 30.0-30.9, adult: Secondary | ICD-10-CM | POA: Diagnosis not present

## 2020-08-17 DIAGNOSIS — Z124 Encounter for screening for malignant neoplasm of cervix: Secondary | ICD-10-CM | POA: Diagnosis not present

## 2020-08-17 DIAGNOSIS — E559 Vitamin D deficiency, unspecified: Secondary | ICD-10-CM

## 2020-08-17 DIAGNOSIS — Z779 Other contact with and (suspected) exposures hazardous to health: Secondary | ICD-10-CM | POA: Diagnosis not present

## 2020-08-20 ENCOUNTER — Other Ambulatory Visit: Payer: Self-pay | Admitting: Hematology

## 2020-08-29 ENCOUNTER — Ambulatory Visit (INDEPENDENT_AMBULATORY_CARE_PROVIDER_SITE_OTHER): Payer: Medicare Other | Admitting: Physician Assistant

## 2020-08-29 ENCOUNTER — Encounter (INDEPENDENT_AMBULATORY_CARE_PROVIDER_SITE_OTHER): Payer: Self-pay | Admitting: Physician Assistant

## 2020-08-29 ENCOUNTER — Other Ambulatory Visit: Payer: Self-pay

## 2020-08-29 VITALS — BP 118/77 | HR 97 | Temp 98.4°F | Ht 63.0 in | Wt 170.0 lb

## 2020-08-29 DIAGNOSIS — E559 Vitamin D deficiency, unspecified: Secondary | ICD-10-CM | POA: Diagnosis not present

## 2020-08-29 DIAGNOSIS — E669 Obesity, unspecified: Secondary | ICD-10-CM | POA: Diagnosis not present

## 2020-08-29 DIAGNOSIS — E8881 Metabolic syndrome: Secondary | ICD-10-CM | POA: Diagnosis not present

## 2020-08-29 DIAGNOSIS — Z6830 Body mass index (BMI) 30.0-30.9, adult: Secondary | ICD-10-CM

## 2020-08-29 NOTE — Progress Notes (Signed)
Chief Complaint:   OBESITY Dawn [name] is here to discuss her progress with her obesity treatment plan along with follow-up of her obesity related diagnoses. Dawn [name] is on keeping a food journal and adhering to recommended goals of 1200 calories and 80 grams of protein and states she is following her eating plan approximately 80% of the time. Dawn [name] states she is walking and lifting weights for 1 hour 20 minutes 7 times per week.  Today's visit was #: 14 Starting weight: 190 lbs Starting date: 10/03/2019 Today's weight: 170 lbs Today's date: 08/29/2020 Total lbs lost to date: 20 lbs Total lbs lost since last in-office visit: 1 lb  Interim History: Dawn [name] is averaging about 1400 calories and 80 grams of protein daily.  She is hungry after dinner.    Subjective:   1. Insulin resistance Dawn [name] has a diagnosis of insulin resistance based on her elevated fasting insulin level >5. She continues to work on diet and exercise to decrease her risk of diabetes.  No medications.  Reports intermittent polyphagia.  Lab Results  Component Value Date   INSULIN 26.4 (H) 04/30/2020   INSULIN 24.0 10/03/2019   Lab Results  Component Value Date   HGBA1C 5.5 04/30/2020   2. Vitamin D deficiency Dawn [name]'s Vitamin D level was 45.82 on 07/25/2020. She is currently taking OTC vitamin D 3,000 IU each day. She denies nausea, vomiting or muscle weakness.  Denies excessive fatigue.  Assessment/Plan:   1. Insulin resistance Dawn [name] will continue to work on weight loss, exercise, and decreasing simple carbohydrates to help decrease the risk of diabetes. Dawn [name] agreed to follow-up with [name] as directed to closely monitor her progress.  IC and labs at next visit.  2. Vitamin D deficiency Low Vitamin D level contributes to fatigue and are associated with obesity, breast, and colon cancer. She agrees to continue to take OTC Vitamin D @3 ,000 IU daily and will follow-up for routine testing of Vitamin D, at least 2-3  times per year to avoid over-replacement.  3. Class 1 obesity with serious comorbidity and body mass index (BMI) of 30.0 to 30.9 in adult, unspecified obesity type  Dawn [name] is currently in the action stage of change. As such, her goal is to continue with weight loss efforts. She has agreed to keeping a food journal and adhering to recommended goals of 1200 calories and 80 grams of protein.   Exercise goals: As is.  Behavioral modification strategies: increasing lean protein intake and meal planning and cooking strategies.  Recheck IC and labs at next visit.  Dawn [name] has agreed to follow-up with our clinic in 3-4 weeks. She was informed of the importance of frequent follow-up visits to maximize her success with intensive lifestyle modifications for her multiple health conditions.   Objective:   Blood pressure 118/77, pulse 97, temperature 98.4 F (36.9 C), height 5\' 3"  (1.6 m), weight 170 lb (77.1 kg), SpO2 (!) 78 %. Body mass index is 30.11 kg/m.  General: Cooperative, alert, well developed, in no acute distress. HEENT: Conjunctivae and lids unremarkable. Cardiovascular: Regular rhythm.  Lungs: Normal work of breathing. Neurologic: No focal deficits.   Lab Results  Component Value Date   CREATININE 0.80 07/25/2020   BUN 13 07/25/2020   NA 138 07/25/2020   K 3.7 07/25/2020   CL 101 07/25/2020   CO2 30 07/25/2020   Lab Results  Component Value Date   ALT 31 07/25/2020   AST 23 07/25/2020   ALKPHOS 38 07/25/2020   BILITOT  0.8 07/25/2020   Lab Results  Component Value Date   HGBA1C 5.5 04/30/2020   HGBA1C 5.4 10/03/2019   HGBA1C 5.4 02/09/2019   HGBA1C 5.5 06/07/2018   HGBA1C 5.2 11/19/2017   Lab Results  Component Value Date   INSULIN 26.4 (H) 04/30/2020   INSULIN 24.0 10/03/2019   Lab Results  Component Value Date   TSH 1.744 07/25/2020   Lab Results  Component Value Date   CHOL 256 (H) 04/30/2020   HDL 56 04/30/2020   LDLCALC 173 (H) 04/30/2020   TRIG 149  04/30/2020   CHOLHDL 4.6 (H) 04/30/2020   Lab Results  Component Value Date   WBC 4.1 07/25/2020   HGB 13.9 07/25/2020   HCT 38.7 07/25/2020   MCV 97.0 07/25/2020   PLT 152 07/25/2020   Lab Results  Component Value Date   FERRITIN 176 12/17/2012   Obesity Behavioral Intervention:   Approximately 15 minutes were spent on the discussion below.  ASK: We discussed the diagnosis of obesity with Dawn [name] today and Dawn [name] agreed to give [name] permission to discuss obesity behavioral modification therapy today.  ASSESS: Dawn [name] has the diagnosis of obesity and her BMI today is 30.3. Dawn [name] is in the action stage of change.   ADVISE: Dawn [name] was educated on the multiple health risks of obesity as well as the benefit of weight loss to improve her health. She was advised of the need for long term treatment and the importance of lifestyle modifications to improve her current health and to decrease her risk of future health problems.  AGREE: Multiple dietary modification options and treatment options were discussed and Dawn [name] agreed to follow the recommendations documented in the above note.  ARRANGE: Dawn [name] was educated on the importance of frequent visits to treat obesity as outlined per CMS and USPSTF guidelines and agreed to schedule her next follow up appointment today.  Attestation Statements:   Reviewed by clinician on day of visit: allergies, medications, problem list, medical history, surgical history, family history, social history, and previous encounter notes.  I, Water quality scientist, CMA, am acting as transcriptionist for [name], PA-C  I have reviewed the above documentation for accuracy and completeness, and I agree with the above. [name], PA-C

## 2020-08-31 ENCOUNTER — Encounter: Payer: Self-pay | Admitting: Family Medicine

## 2020-09-05 ENCOUNTER — Inpatient Hospital Stay: Payer: Medicare Other | Attending: Hematology

## 2020-09-05 ENCOUNTER — Other Ambulatory Visit: Payer: Self-pay

## 2020-09-05 DIAGNOSIS — R946 Abnormal results of thyroid function studies: Secondary | ICD-10-CM | POA: Insufficient documentation

## 2020-09-05 DIAGNOSIS — Z452 Encounter for adjustment and management of vascular access device: Secondary | ICD-10-CM | POA: Diagnosis not present

## 2020-09-05 DIAGNOSIS — E8881 Metabolic syndrome: Secondary | ICD-10-CM | POA: Insufficient documentation

## 2020-09-05 DIAGNOSIS — I10 Essential (primary) hypertension: Secondary | ICD-10-CM | POA: Insufficient documentation

## 2020-09-05 DIAGNOSIS — Z170 Estrogen receptor positive status [ER+]: Secondary | ICD-10-CM | POA: Diagnosis not present

## 2020-09-05 DIAGNOSIS — E7800 Pure hypercholesterolemia, unspecified: Secondary | ICD-10-CM | POA: Diagnosis not present

## 2020-09-05 DIAGNOSIS — M2560 Stiffness of unspecified joint, not elsewhere classified: Secondary | ICD-10-CM | POA: Insufficient documentation

## 2020-09-05 DIAGNOSIS — Z79899 Other long term (current) drug therapy: Secondary | ICD-10-CM | POA: Insufficient documentation

## 2020-09-05 DIAGNOSIS — Z923 Personal history of irradiation: Secondary | ICD-10-CM | POA: Diagnosis not present

## 2020-09-05 DIAGNOSIS — Z9221 Personal history of antineoplastic chemotherapy: Secondary | ICD-10-CM | POA: Insufficient documentation

## 2020-09-05 DIAGNOSIS — Z8542 Personal history of malignant neoplasm of other parts of uterus: Secondary | ICD-10-CM | POA: Diagnosis not present

## 2020-09-05 DIAGNOSIS — C50211 Malignant neoplasm of upper-inner quadrant of right female breast: Secondary | ICD-10-CM | POA: Insufficient documentation

## 2020-09-05 DIAGNOSIS — K219 Gastro-esophageal reflux disease without esophagitis: Secondary | ICD-10-CM | POA: Insufficient documentation

## 2020-09-05 DIAGNOSIS — Z8719 Personal history of other diseases of the digestive system: Secondary | ICD-10-CM | POA: Insufficient documentation

## 2020-09-05 DIAGNOSIS — Z95828 Presence of other vascular implants and grafts: Secondary | ICD-10-CM

## 2020-09-05 DIAGNOSIS — G629 Polyneuropathy, unspecified: Secondary | ICD-10-CM | POA: Diagnosis not present

## 2020-09-05 DIAGNOSIS — Z90710 Acquired absence of both cervix and uterus: Secondary | ICD-10-CM | POA: Insufficient documentation

## 2020-09-05 DIAGNOSIS — Z79811 Long term (current) use of aromatase inhibitors: Secondary | ICD-10-CM | POA: Insufficient documentation

## 2020-09-05 DIAGNOSIS — M8580 Other specified disorders of bone density and structure, unspecified site: Secondary | ICD-10-CM | POA: Diagnosis not present

## 2020-09-05 DIAGNOSIS — M1990 Unspecified osteoarthritis, unspecified site: Secondary | ICD-10-CM | POA: Diagnosis not present

## 2020-09-05 MED ORDER — HEPARIN SOD (PORK) LOCK FLUSH 100 UNIT/ML IV SOLN
500.0000 [IU] | Freq: Once | INTRAVENOUS | Status: AC | PRN
  Administered 2020-09-05: 500 [IU] via INTRAVENOUS
  Filled 2020-09-05: qty 5

## 2020-09-05 MED ORDER — SODIUM CHLORIDE 0.9% FLUSH
10.0000 mL | INTRAVENOUS | Status: DC | PRN
  Administered 2020-09-05: 10 mL via INTRAVENOUS
  Filled 2020-09-05: qty 10

## 2020-09-11 DIAGNOSIS — Z01419 Encounter for gynecological examination (general) (routine) without abnormal findings: Secondary | ICD-10-CM | POA: Diagnosis not present

## 2020-09-25 ENCOUNTER — Ambulatory Visit (INDEPENDENT_AMBULATORY_CARE_PROVIDER_SITE_OTHER): Payer: Medicare Other | Admitting: Physician Assistant

## 2020-09-25 ENCOUNTER — Encounter (INDEPENDENT_AMBULATORY_CARE_PROVIDER_SITE_OTHER): Payer: Self-pay | Admitting: Physician Assistant

## 2020-09-25 ENCOUNTER — Other Ambulatory Visit: Payer: Self-pay

## 2020-09-25 VITALS — BP 131/81 | HR 72 | Temp 97.6°F | Ht 63.0 in | Wt 172.0 lb

## 2020-09-25 DIAGNOSIS — E038 Other specified hypothyroidism: Secondary | ICD-10-CM

## 2020-09-25 DIAGNOSIS — E669 Obesity, unspecified: Secondary | ICD-10-CM

## 2020-09-25 DIAGNOSIS — Z6830 Body mass index (BMI) 30.0-30.9, adult: Secondary | ICD-10-CM | POA: Diagnosis not present

## 2020-09-25 DIAGNOSIS — R0602 Shortness of breath: Secondary | ICD-10-CM | POA: Diagnosis not present

## 2020-09-26 NOTE — Progress Notes (Unsigned)
Chief Complaint:   OBESITY Dawn [name] is here to discuss her progress with her obesity treatment plan along with follow-up of her obesity related diagnoses. Dawn [name] is on keeping a food journal and adhering to recommended goals of 1200 calories and 80 grams of protein daily and states she is following her eating plan approximately 60% of the time. Dawn [name] states she is walking and doing weights for 60 minutes 4 times per week.  Today's visit was #: 15 Starting weight: 190 lbs Starting date: 10/03/2019 Today's weight: 172 lbs Today's date: 09/25/2020 Total lbs lost to date: 18 Total lbs lost since last in-office visit: 0  Interim History: Dawn [name]'s RMR today shows an increase to 1422. She states she has been very off track, eating potatoes and indulging in holiday foods. She is ready to get back on track.  Subjective:   1. Shortness of breath on exertion Dawn [name] reports shortness of breath with exertion. She denies dizziness or lightheadedness.  2. Other specified hypothyroidism Dawn [name] is followed by her primary care physician. She is on levothyroxine. Last TSH was within normal range.  Assessment/Plan:   1. Shortness of breath on exertion IC was done today, and Dawn [name] will continue to follow up as directed.  2. Other specified hypothyroidism Patient with long-standing hypothyroidism. Dawn [name] will continue her medications, and will follow up with her primary care physician. Orders and follow up as documented in patient record.  Counseling . Good thyroid control is important for overall health. Supratherapeutic thyroid levels are dangerous and will not improve weight loss results. . The correct way to take levothyroxine is fasting, with water, separated by at least 30 minutes from breakfast, and separated by more than 4 hours from calcium, iron, multivitamins, acid reflux medications (PPIs).   3. Class 1 obesity with serious comorbidity and body mass index (BMI) of 30.0 to 30.9 in  adult, unspecified obesity type Dawn [name] is currently in the action stage of change. As such, her goal is to continue with weight loss efforts. She has agreed to the Category 2 Plan.   We will recheck fasting labs at her next visit.  Exercise goals: As is.  Behavioral modification strategies: meal planning and cooking strategies and keeping healthy foods in the home.  Dawn [name] has agreed to follow-up with our clinic in 2 weeks. She was informed of the importance of frequent follow-up visits to maximize her success with intensive lifestyle modifications for her multiple health conditions.   Objective:   Blood pressure 131/81, pulse 72, temperature 97.6 F (36.4 C), height 5\' 3"  (1.6 m), weight 172 lb (78 kg), SpO2 (!) 72 %. Body mass index is 30.47 kg/m.  General: Cooperative, alert, well developed, in no acute distress. HEENT: Conjunctivae and lids unremarkable. Cardiovascular: Regular rhythm.  Lungs: Normal work of breathing. Neurologic: No focal deficits.   Lab Results  Component Value Date   CREATININE 0.80 07/25/2020   BUN 13 07/25/2020   NA 138 07/25/2020   K 3.7 07/25/2020   CL 101 07/25/2020   CO2 30 07/25/2020   Lab Results  Component Value Date   ALT 31 07/25/2020   AST 23 07/25/2020   ALKPHOS 38 07/25/2020   BILITOT 0.8 07/25/2020   Lab Results  Component Value Date   HGBA1C 5.5 04/30/2020   HGBA1C 5.4 10/03/2019   HGBA1C 5.4 02/09/2019   HGBA1C 5.5 06/07/2018   HGBA1C 5.2 11/19/2017   Lab Results  Component Value Date   INSULIN 26.4 (H) 04/30/2020   INSULIN  24.0 10/03/2019   Lab Results  Component Value Date   TSH 1.744 07/25/2020   Lab Results  Component Value Date   CHOL 256 (H) 04/30/2020   HDL 56 04/30/2020   LDLCALC 173 (H) 04/30/2020   TRIG 149 04/30/2020   CHOLHDL 4.6 (H) 04/30/2020   Lab Results  Component Value Date   WBC 4.1 07/25/2020   HGB 13.9 07/25/2020   HCT 38.7 07/25/2020   MCV 97.0 07/25/2020   PLT 152 07/25/2020   Lab  Results  Component Value Date   FERRITIN 176 12/17/2012    Obesity Behavioral Intervention:   Approximately 15 minutes were spent on the discussion below.  ASK: We discussed the diagnosis of obesity with Dawn [name] today and Dawn [name] agreed to give [name] permission to discuss obesity behavioral modification therapy today.  ASSESS: Dawn [name] has the diagnosis of obesity and her BMI today is 30.48. Dawn [name] is in the action stage of change.   ADVISE: Dawn [name] was educated on the multiple health risks of obesity as well as the benefit of weight loss to improve her health. She was advised of the need for long term treatment and the importance of lifestyle modifications to improve her current health and to decrease her risk of future health problems.  AGREE: Multiple dietary modification options and treatment options were discussed and Dawn [name] agreed to follow the recommendations documented in the above note.  ARRANGE: Dawn [name] was educated on the importance of frequent visits to treat obesity as outlined per CMS and USPSTF guidelines and agreed to schedule her next follow up appointment today.  Attestation Statements:   Reviewed by clinician on day of visit: allergies, medications, problem list, medical history, surgical history, family history, social history, and previous encounter notes.   [name], am acting as transcriptionist for [employer], PA-C.  I have reviewed the above documentation for accuracy and completeness, and I agree with the above. [name], PA-C

## 2020-10-09 ENCOUNTER — Telehealth: Payer: Self-pay | Admitting: Family Medicine

## 2020-10-09 NOTE — Telephone Encounter (Signed)
Call pt: your bone density shows score of -2.4. thie is in the osteopenia range.  Encourage regular exercise, weight bearing exercise and calcium with vitamin D daily.  Repeat in 2 yrars.

## 2020-10-10 ENCOUNTER — Other Ambulatory Visit: Payer: Self-pay

## 2020-10-10 ENCOUNTER — Ambulatory Visit (INDEPENDENT_AMBULATORY_CARE_PROVIDER_SITE_OTHER): Payer: Medicare Other | Admitting: Physician Assistant

## 2020-10-10 VITALS — BP 116/64 | HR 64 | Temp 97.5°F | Ht 63.0 in | Wt 171.0 lb

## 2020-10-10 DIAGNOSIS — R739 Hyperglycemia, unspecified: Secondary | ICD-10-CM | POA: Diagnosis not present

## 2020-10-10 DIAGNOSIS — E7849 Other hyperlipidemia: Secondary | ICD-10-CM | POA: Diagnosis not present

## 2020-10-10 DIAGNOSIS — E669 Obesity, unspecified: Secondary | ICD-10-CM

## 2020-10-10 DIAGNOSIS — Z6830 Body mass index (BMI) 30.0-30.9, adult: Secondary | ICD-10-CM | POA: Diagnosis not present

## 2020-10-10 NOTE — Progress Notes (Signed)
Chief Complaint:   OBESITY Dawn Dawn [name] is here to discuss her progress with her obesity treatment plan along with follow-up of her obesity related diagnoses. Dawn [name] is on the Category 2 Plan and states she is following her eating plan approximately 85% of the time. Dawn Dawn [name] states she is walking for 60 minutes 3-4 times per week.  Today's visit was #: 16 Starting weight: 190 lbs Starting date: 10/03/2019 Today's weight: 171 lbs Today's date: 10/10/2020 Total lbs lost to date: 19 Total lbs lost since last in-office visit: 1  Interim History: Dawn Dawn [name] believes that some of her eating is emotional. She has been eating Kind bars and Pistachios as snacks. She is walking less due to COVID and the weather. She notes snacking 2 times after dinner twice since her last visit.  Subjective:   1. Hyperglycemia Dawn Dawn [name] is not on medications, and she reports cravings for sweets.  2. Other hyperlipidemia Dawn Dawn [name] is on Lipitor, and she denies chest pain or myalgias. She is managed by her primary care physician.  Assessment/Plan:   1. Hyperglycemia Fasting labs will be obtained today, and results with be discussed with Dawn Dawn [name] in 2 weeks at her follow up visit. In the meanwhile Dawn Dawn [name] will continue her meal plan and will work on weight loss efforts.  - Hemoglobin A1c - Insulin, random  2. Other hyperlipidemia Cardiovascular risk and specific lipid/LDL goals reviewed. We discussed several lifestyle modifications today. We will check labs today. Dawn Dawn [name] will continue her meal plan, and will continue to work on exercise and weight loss efforts. Orders and follow up as documented in patient record.   Counseling Intensive lifestyle modifications are the first line treatment for this issue. . Dietary changes: Increase soluble fiber. Decrease simple carbohydrates. . Exercise changes: Moderate to vigorous-intensity aerobic activity 150 minutes per week if tolerated. . Lipid-lowering medications: see documented  in medical record.  - Lipid panel  3. Class 1 obesity with serious comorbidity and body mass index (BMI) of 30.0 to 30.9 in adult, unspecified obesity type Dawn Dawn [name] is currently in the action stage of change. As such, her goal is to continue with weight loss efforts. She has Dawn [name] to keeping a food journal and adhering to recommended goals of 1200 calories and 80 grams of protein daily.   Exercise goals: As is.  Behavioral modification strategies: planning for success and keeping a strict food journal.  Dawn Dawn [name] has Dawn [name] to follow-up with our clinic in 3 weeks. She was informed of the importance of frequent follow-up visits to maximize her success with intensive lifestyle modifications for her multiple health conditions.   Dawn Dawn [name] was informed we would discuss her lab results at her next visit unless there is a critical issue that needs to be addressed sooner. Dawn Dawn [name] to keep her next visit at the Dawn [name] upon time to discuss these results.  Objective:   Blood pressure 116/64, pulse 64, temperature (!) 97.5 F (36.4 C), height 5\' 3"  (1.6 m), weight 171 lb (77.6 kg), SpO2 97 %. Body mass index is 30.29 kg/m.  General: Cooperative, alert, well developed, in no acute distress. HEENT: Conjunctivae and lids unremarkable. Cardiovascular: Regular rhythm.  Lungs: Normal work of breathing. Neurologic: No focal deficits.   Lab Results  Component Value Date   CREATININE 0.80 07/25/2020   BUN 13 07/25/2020   NA 138 07/25/2020   K 3.7 07/25/2020   CL 101 07/25/2020   CO2 30 07/25/2020   Lab Results  Component Value Date   ALT 31 07/25/2020  AST 23 07/25/2020   ALKPHOS 38 07/25/2020   BILITOT 0.8 07/25/2020   Lab Results  Component Value Date   HGBA1C 5.5 04/30/2020   HGBA1C 5.4 10/03/2019   HGBA1C 5.4 02/09/2019   HGBA1C 5.5 06/07/2018   HGBA1C 5.2 11/19/2017   Lab Results  Component Value Date   INSULIN 26.4 (H) 04/30/2020   INSULIN 24.0 10/03/2019   Lab Results   Component Value Date   TSH 1.744 07/25/2020   Lab Results  Component Value Date   CHOL 256 (H) 04/30/2020   HDL 56 04/30/2020   LDLCALC 173 (H) 04/30/2020   TRIG 149 04/30/2020   CHOLHDL 4.6 (H) 04/30/2020   Lab Results  Component Value Date   WBC 4.1 07/25/2020   HGB 13.9 07/25/2020   HCT 38.7 07/25/2020   MCV 97.0 07/25/2020   PLT 152 07/25/2020   Lab Results  Component Value Date   FERRITIN 176 12/17/2012    Obesity Behavioral Intervention:   Approximately 15 minutes were spent on the discussion below.  ASK: We discussed the diagnosis of obesity with Dawn Dawn [name] today and Dawn Dawn [name] to give [name] permission to discuss obesity behavioral modification therapy today.  ASSESS: Dawn [name] has the diagnosis of obesity and her BMI today is 30.3. Dawn [name] is in the action stage of change.   ADVISE: Dawn [name] was educated on the multiple health risks of obesity as well as the benefit of weight loss to improve her health. She was advised of the need for long term treatment and the importance of lifestyle modifications to improve her current health and to decrease her risk of future health problems.  AGREE: Multiple dietary modification options and treatment options were discussed and Dawn Dawn [name] to follow the recommendations documented in the above note.  ARRANGE: Dawn [name] was educated on the importance of frequent visits to treat obesity as outlined per CMS and USPSTF guidelines and Dawn [name] to schedule her next follow up appointment today.  Attestation Statements:   Reviewed by clinician on day of visit: allergies, medications, problem list, medical history, surgical history, family history, social history, and previous encounter notes.   Dawn Dawn [name], am acting as transcriptionist for [employer], PA-C.  I have reviewed the above documentation for accuracy and completeness, and I agree with the above. Dawn Potash, PA-C

## 2020-10-11 ENCOUNTER — Telehealth: Payer: Self-pay | Admitting: Family Medicine

## 2020-10-11 ENCOUNTER — Encounter: Payer: Self-pay | Admitting: Family Medicine

## 2020-10-11 LAB — LIPID PANEL
Chol/HDL Ratio: 5.2 ratio — ABNORMAL HIGH (ref 0.0–4.4)
Cholesterol, Total: 290 mg/dL — ABNORMAL HIGH (ref 100–199)
HDL: 56 mg/dL (ref >39)
LDL Chol Calc (NIH): 209 mg/dL — ABNORMAL HIGH (ref 0–99)
Triglycerides: 137 mg/dL (ref 0–149)
VLDL Cholesterol Cal: 25 mg/dL (ref 5–40)

## 2020-10-11 LAB — HEMOGLOBIN A1C
Est. average glucose Bld gHb Est-mCnc: 108 mg/dL
Hgb A1c MFr Bld: 5.4 % (ref 4.8–5.6)

## 2020-10-11 LAB — INSULIN, RANDOM: INSULIN: 18.1 u[IU]/mL (ref 2.6–24.9)

## 2020-10-11 NOTE — Telephone Encounter (Signed)
Called and advised pt of results/recommenations. She asked about a letter that will pay for her to participate in the [facility].  She mentioned something about CPT code [phone]?

## 2020-10-12 NOTE — Telephone Encounter (Signed)
error 

## 2020-10-12 NOTE — Telephone Encounter (Signed)
Letter updated.  Please print.

## 2020-10-13 ENCOUNTER — Other Ambulatory Visit: Payer: Self-pay | Admitting: Family Medicine

## 2020-10-13 DIAGNOSIS — I10 Essential (primary) hypertension: Secondary | ICD-10-CM

## 2020-10-16 NOTE — Telephone Encounter (Signed)
Letter printed and mailed.  

## 2020-10-19 NOTE — Progress Notes (Signed)
[place]   [phone] [phone]   Clinic Follow up Note   Patient Care Team: [name], MD as PCP - Haskell Riling, MD (Inactive) as Consulting Physician (General Surgery) Truitt Merle, MD as Consulting Physician (Hematology) [name], MD as Attending Physician (Radiation Oncology)  Date of Service:  10/24/2020  CHIEF COMPLAINT: F/u on breast and endometrial cancers  SUMMARY OF ONCOLOGIC HISTORY: Oncology History Overview Note  MSI high Cancer Staging Endometrial cancer [facility]) Staging form: Corpus Uteri - Adenosarcoma, AJCC 8th Edition - Pathologic stage from 06/09/2017: Stage IIIC (pT1a, pN1, cM0) - Signed by Truitt Merle, MD on 06/16/2017  Malignant neoplasm of upper-inner quadrant of right breast in female, estrogen receptor positive ([place]) Staging form: Breast, AJCC 8th Edition - Clinical stage from 05/06/2017: Stage IA (cT1c, cN0, cM0, G2, ER: Positive, PR: Negative, HER2: Positive) - Unsigned - Pathologic stage from 05/25/2017: Stage IIA (pT2, pN0, cM0, G2, ER: Positive, PR: Negative, HER2: Negative) - Signed by Truitt Merle, MD on 06/16/2017     Malignant neoplasm of upper-inner quadrant of right breast in female, estrogen receptor positive ([place])  04/27/2017 Initial Biopsy   Diagnosis Breast, right, needle core biopsy INVASIVE DUCTAL CARCINOMA, GRADE 2 Microscopic Comment The neoplasm has intracellular mucin and signet ring cell features. Immunostains shows these cells are positive for ER,GATA3, ck7 and GCDFP, negative for ck20, cdx2, TTF-1 and pax8, The immunostaining pattern supports the neoplasm is breast primary.   04/27/2017 Receptors her2   Estrogen Receptor: 70%, POSITIVE, MODERATE STAINING INTENSITY Progesterone Receptor: 0%, NEGATIVE Proliferation Marker Ki67: 30% HER2 - **POSITIVE** RATIO OF HER2/CEP17 SIGNALS 2.91 AVERAGE HER2 COPY NUMBER PER CELL 7.28   04/27/2017 Initial Diagnosis   Malignant neoplasm  of upper-inner quadrant of right breast in female, estrogen receptor positive ([place])   05/07/2017 Imaging   CT Chest W Contrast 05/07/17 IMPRESSION: Tiny well-defined fatty lesion on the pleura at the right lung base. The thinner slice collimation used for today's chest CT eliminates volume-averaging seen in the lesion on the prior exam and confirms that this is a diffusely fatty nodule. This is a benign finding and likely represents a tiny lipoma. No defect in the hemidiaphragm evident to suggest tiny diaphragmatic hernia. Pulmonary hamartoma a consideration although the lack of soft tissue components makes this less likely.   05/15/2017 Genetic Testing   Patient had genetic testing due to a personal history of breast cancer and uterine cancer as well as a family history of cancer. The Multi-Cancer panel was ordered. The Multi-Cancer Panel offered by [facility] includes sequencing and/or deletion duplication testing of the following 83 genes: ALK, APC, ATM, AXIN2,BAP1,  BARD1, BLM, BMPR1A, BRCA1, BRCA2, BRIP1, CASR, CDC73, CDH1, CDK4, CDKN1B, CDKN1C, CDKN2A (p14ARF), CDKN2A (p16INK4a), CEBPA, CHEK2, CTNNA1, DICER1, DIS3L2, EGFR (c.2369C>T, p.Thr790Met variant only), EPCAM (Deletion/duplication testing only), FH, FLCN, GATA2, GPC3, GREM1 (Promoter region deletion/duplication testing only), HOXB13 (c.251G>A, p.Gly84Glu), HRAS, KIT, MAX, MEN1, MET, MITF (c.952G>A, p.Glu318Lys variant only), MLH1, MSH2, MSH3, MSH6, MUTYH, NBN, NF1, NF2, NTHL1, PALB2, PDGFRA, PHOX2B, PMS2, POLD1, POLE, POT1, PRKAR1A, PTCH1, PTEN, RAD50, RAD51C, RAD51D, RB1, RECQL4, RET, RUNX1, SDHAF2, SDHA (sequence changes only), SDHB, SDHC, SDHD, SMAD4, SMARCA4, SMARCB1, SMARCE1, STK11, SUFU, TERC, TERT, TMEM127, TP53, TSC1, TSC2, VHL, WRN and WT1.   Results: No pathogenic mutations were identified. A VUS in the GATA2 gene c.1348G>A (p.Gly450Arg) was identified.  The date of this test report is 05/25/2017.     05/25/2017 Surgery   RIGHT  BREAST LUMPECTOMY WITH RADIOACTIVE  SEED AND  RIGHT AXILLARY SENTINEL LYMPH NODE BIOPSY and Pot placement by Dr. Excell Seltzer 05/25/17   05/25/2017 Pathology Results   Diagnosis 05/25/17 1. Breast, lumpectomy, right - INVASIVE DUCTAL CARCINOMA, GRADE II/III, SPANNING 2.2 CM. - DUCTAL CARCINOMA IN SITU, HIGH GRADE. - THE SURGICAL RESECTION MARGINS ARE NEGATIVE FOR CARCINOMA. - SEE ONCOLOGY TABLE BELOW. 2. Breast, excision, right additional superior margin - BENIGN FIBROADIPOSE TISSUE. - BENIGN SKELETAL MUSCLE. - SEE COMMENT. 3. Breast, excision, right additional lateral margin - BENIGN BREAST PARENCHYMA. - THERE IS NO EVIDENCE OF MALIGNANCY. - SEE COMMENT. 4. Breast, excision, chest wall margin - BENIGN FIBROADIPOSE TISSUE. - THERE IS NO EVIDENCE OF MALIGNANCY. - SEE COMMENT. 5. Lymph node, sentinel, biopsy, right axillary - THERE IS NO EVIDENCE OF CARCINOMA IN 1 OF 1 LYMPH NODE (0/1). 6. Lymph node, sentinel, biopsy, right axillary - THERE IS NO EVIDENCE OF CARCINOMA IN 1 OF 1 LYMPH NODE (0/1). 7. Lymph node, sentinel, biopsy, right axillary - THERE IS NO EVIDENCE OF CARCINOMA IN 1 OF 1 LYMPH NODE (0/1).   06/26/2017 PET scan   PET  IMPRESSION: 1. Postoperative findings both in the right breast and in the anatomic pelvis, with associated low-grade activity considered to be postoperative in nature. No hypermetabolic adenopathy or hypermetabolic lesions are identified to suggest active metastatic disease/malignancy. 2. Other imaging findings of potential clinical significance: Aortic Atherosclerosis (ICD10-I70.0). Sigmoid colon diverticulosis. Biapical pleuroparenchymal scarring in the lungs. Small amount of free pelvic fluid in the cul-de-sac, likely postoperative.    07/01/2017 - 10/14/2017 Chemotherapy   Adjuvant TCH with Onpro every 3 weeks for 6 cycles starting on 07/01/17, Changed Taxol to abraxane with cycle 4 due to drug rash reaction, followed by Herceptin every 3 weeks for  6 months    11/01/2017 - 12/08/2017 Radiation Therapy   Radiation therapy to her right breast with [name]   11/04/2017 - 06/2018 Chemotherapy   Maintenance Herceptin starting 11/04/17 and will comeplete her 12 months in 06/2018    12/23/2017 -  Anti-estrogen oral therapy   Adjuvant letrozole 2.5 mg daily started 12/23/17, switched to exemestane on 04/21/18 due to joint pain. Could not afford aromasin, started anastrozole 04/2018.    12/30/2017 Imaging   CT CAP W Contrast 12/30/17 IMPRESSION: Increased size of 1.1 cm aorto-caval retroperitoneal lymph node. This could be reactive due to interval hysterectomy, however metastatic carcinoma cannot be excluded. Consider continued attention on short-term follow-up CT, or PET-CT scan for further evaluation.  Mild hepatic steatosis.   01/25/2018 PET scan   IMPRESSION: 1. Enlarging hypermetabolic aortocaval lymph node is worrisome for metastatic disease. 2. Probable postoperative seroma in the medial right breast, with associated mild inflammatory hypermetabolism.   04/28/2018 Mammogram   Probably benign. Calcifications in the right breast most likely are dystrophic, related to previous surgery, and are probably benign.    04/28/2018 Imaging   DEXA Scan T Score -2.0   06/10/2018 Echocardiogram   06/10/2018 ECHO LV EF: 55% -   60%   08/28/2018 - 08/2018 Chemotherapy   Neratinib $RemoveBe'4mg'[id number]$  for 1 week then titrate up with 1 additional tablet weekly up to 12 mg starting 08/28/18. Stopped soon after starting due to diarrhea.   10/25/2018 Imaging   CT CAP 10/25/18  IMPRESSION: 1. No evidence of metastatic disease. 2.  Aortic atherosclerosis (ICD10-170.0).   06/15/2019 Pathology Results   Diagnosis Breast, right, needle core biopsy, 1 o'clock, 7cmfn - DENSE FIBROSIS CONSISTENT WITH PRIOR PROCEDURAL CHANGES. - NO MALIGNANCY IDENTIFIED.   10/24/2019  Imaging   CT CAP W Contrast  IMPRESSION: 1. Stable exam. No evidence of recurrent or metastatic  carcinoma within the chest, abdomen, or pelvis. 2. Colonic diverticulosis, without radiographic evidence of diverticulitis.   Aortic Atherosclerosis (ICD10-I70.0).   Endometrial adenocarcinoma ([place])  05/07/2017 Initial Diagnosis   Endometrial adenocarcinoma ([place])   06/09/2017 Surgery   XI ROBOTIC ASSISTED TOTAL LAPOROSCOPIC HYSTERECTOMY WITH BILATERAL SALPINGO OOPHORECTOMY and SENTINEL NODE BIOPSY by Dr. Denman George 06/09/17   06/09/2017 Pathology Results   Diagnosis 06/09/17 1. Lymph node, sentinel, biopsy, right external iliac - METASTATIC ADENOCARCINOMA IN ONE LYMPH NODE (1/1). 2. Lymph node, sentinel, biopsy, left obturator - ONE BENIGN LYMPH NODE (0/1). 3. Lymph node, sentinel, biopsy, left external iliac - METASTATIC ADENOCARCINOMA IN ONE LYMPH NODE (1/1). 4. Uterus +/- tubes/ovaries, neoplastic ENDOMETRIUM: - ENDOMETRIAL ADENOCARCINOMA, 3.2 CM. - CARCINOMA INVADES INNER HALF OF MYOMETRIUM. 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 - CERVIX, BILATERAL FALLOPIAN TUBES AND BILATERAL OVARIES FREE OF TUMOR   08/18/2017 - 09/17/2017 Radiation Therapy   vaginal brachytherapy per 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   02/17/2018 - 02/08/2020 Antibody Plan   Keytruda every 3 weeks starting 02/17/18. Completed 2 years on 02/08/20   04/11/2019 Imaging   CT AP W Contrast  IMPRESSION: 1. No evidence of recurrent or metastatic carcinoma within the abdomen or pelvis. 2. Colonic diverticulosis. No radiographic evidence of diverticulitis.     04/23/2020 Imaging   CT AP W contrast  IMPRESSION: 1. Stable exam. Specifically, no findings to suggest recurrent or metastatic disease. 2. Left colonic diverticulosis without diverticulitis. 3. Aortic Atherosclerosis (ICD10-I70.0).        CURRENT THERAPY:  Letrozole 2.$RemoveBefo'5mg'NyGtmrwrjtt$  daily starting 12/23/17. Switched to Anastrozole in  04/2018 due to joint pain Zometa q34months for 2 years starting 10/24/20  INTERVAL HISTORY:  Dawn [name] is here for a follow up. She notes she is doing well. She notes her days are mostly going fairly well. 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 She notes she is tolerating Anastrozole well. She notes with [facility] she has lost some weight.    REVIEW OF SYSTEMS:   Constitutional: Denies fevers, chills or abnormal weight loss Eyes: Denies blurriness of vision Ears, nose, mouth, throat, and face: Denies mucositis or sore throat Respiratory: Denies cough, dyspnea or wheezes Cardiovascular: Denies palpitation, chest discomfort or lower extremity swelling Gastrointestinal:  Denies nausea, heartburn or change in bowel habits Skin: Denies abnormal skin rashes Lymphatics: Denies new lymphadenopathy or easy bruising Neurological:Denies numbness, tingling or new weaknesses Behavioral/Psych: Mood is stable, no new changes  All other systems were reviewed with the patient and are negative.  MEDICAL HISTORY:  Past Medical History:  Diagnosis Date  . Anemia    as a child.  . Arthritis   . Bilateral cataracts   . Bilateral leg cramps   . Cancer ([place])    skin  . Dyspnea   . Endometrial cancer ([place])   . Family history of bladder cancer   . Family history of colon cancer in father   . Fatty liver   . Fuchs' corneal dystrophy   . GERD (gastroesophageal reflux disease)   . Hearing loss    Right side 30%  . Heartburn   . History of hiatal hernia    small size  . History of radiation  therapy 08/21/17, 08/28/17,09/01/17, 09/11/17, 09/17/17   Vaginal cuff brachytherapy.   Marland Kitchen History of radiation therapy 11/11/17- 12/08/17   Right Breast treated to 40.05 Gy with 15 fx of 2.67 Gy and a boost of 10 Gy with 5 fx.   . Hyperlipidemia   . Hypertension   . Hypothyroidism   . IBS (irritable bowel syndrome)    hx of  . Malignant neoplasm of upper-inner  quadrant of right female breast ([facility]) 04/2017  . NAFL (nonalcoholic fatty liver)   . Obesity   . Osteoarthritis   . Osteopenia   . PONV (postoperative nausea and vomiting)   . 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 Uterine cancer ([facility]) 04/2017   endometrial cancer  . Varices, gastric   . Vertigo     SURGICAL HISTORY: Past Surgical History:  Procedure Laterality Date  . BREAST BIOPSY Right 04/27/2017  . BREAST LUMPECTOMY WITH RADIOACTIVE SEED AND SENTINEL LYMPH NODE BIOPSY Right 05/25/2017   Procedure: RIGHT BREAST LUMPECTOMY WITH RADIOACTIVE SEED AND  RIGHT AXILLARY SENTINEL LYMPH NODE BIOPSY;  Surgeon: Glenna Fellows, MD;  Location: [facility];  Service: General;  Laterality: Right;  . CATARACT EXTRACTION, BILATERAL    . COLONOSCOPY    . HYSTEROSCOPY WITH D & C N/A 04/29/2017   Procedure: DILATATION AND CURETTAGE /HYSTEROSCOPY;  Surgeon: Mitchel Honour, DO;  Location: [facility];  Service: Gynecology;  Laterality: N/A;  . PORTACATH PLACEMENT Left 05/25/2017   Procedure: INSERTION PORT-A-CATH;  Surgeon: Glenna Fellows, MD;  Location: [facility];  Service: General;  Laterality: Left;  . ROBOTIC ASSISTED TOTAL HYSTERECTOMY WITH BILATERAL SALPINGO OOPHERECTOMY N/A 06/09/2017   Procedure: XI ROBOTIC ASSISTED TOTAL LAPOROSCOPIC HYSTERECTOMY WITH BILATERAL SALPINGO OOPHORECTOMY;  Surgeon: Adolphus Birchwood, MD;  Location: [facility];  Service: Gynecology;  Laterality: N/A;  . SENTINEL NODE BIOPSY N/A 06/09/2017   Procedure: SENTINEL NODE BIOPSY;  Surgeon: Adolphus Birchwood, MD;  Location: [facility];  Service: Gynecology;  Laterality: N/A;    I have reviewed the social history and family history with the patient and they are unchanged from previous note.  ALLERGIES:  is allergic to ciprofloxacin, paclitaxel, crestor [rosuvastatin calcium], fosamax [alendronate sodium], livalo [pitavastatin], and penicillins.  MEDICATIONS:  Current Outpatient  Medications  Medication Sig Dispense Refill  . acetaminophen (TYLENOL) 500 MG tablet Take 500 mg by mouth every 6 (six) hours as needed for moderate pain.    . Alpha-Lipoic Acid 600 MG CAPS Take 1 capsule (600 mg total) by mouth daily. 90 capsule 3  . AMBULATORY NON FORMULARY MEDICATION Medication Name: Shingrix IM x 1.  Repeat in 2-6 months. 1 Units 0  . anastrozole (ARIMIDEX) 1 MG tablet TAKE 1 TABLET BY MOUTH  DAILY 90 tablet 3  . Ascorbic Acid (VITAMIN C) 1000 MG tablet Take 1,000 mg by mouth daily.    Marland Kitchen atorvastatin (LIPITOR) 10 MG tablet Take 1 tablet (10 mg total) by mouth daily. 90 tablet 1  . b complex vitamins capsule Take 1 capsule by mouth daily.    . Cholecalciferol (VITAMIN D3) 75 MCG (3000 UT) TABS Take 1 capsule by mouth daily.    . diphenhydrAMINE (BENADRYL) 25 MG tablet Take 25-50 mg by mouth every 6 (six) hours as needed for sleep.    . fluticasone (FLONASE) 50 MCG/ACT nasal spray Place 2 sprays into both nostrils daily.    Marland Kitchen gabapentin (NEURONTIN) 100 MG capsule TAKE 1 CAPSULE BY MOUTH 3  TIMES DAILY 180  capsule 5  . levothyroxine (SYNTHROID) 50 MCG tablet TAKE 1 TABLET BY MOUTH  DAILY 90 tablet 3  . lidocaine-prilocaine (EMLA) cream APPLY TO AFFECTED AREA ONCE 30 g 2  . Multiple Vitamin (MULTIVITAMIN) capsule Take 1 capsule by mouth daily.      Marland Kitchen olmesartan-hydrochlorothiazide (BENICAR HCT) 20-12.5 MG tablet TAKE 1 TABLET BY MOUTH  DAILY 90 tablet 3  . pantoprazole (PROTONIX) 20 MG tablet Take 1 tablet (20 mg total) by mouth daily. 90 tablet 3  . potassium chloride SA (KLOR-CON) 20 MEQ tablet TAKE 1 TABLET BY MOUTH  DAILY 90 tablet 3  . pyridOXINE (VITAMIN B-6) 50 MG tablet Take 1 tablet (50 mg total) by mouth daily. 90 tablet 3  . sodium chloride (MURO 128) 5 % ophthalmic solution Place 2 drops into both eyes daily.     No current facility-administered medications for this visit.    PHYSICAL EXAMINATION: ECOG PERFORMANCE STATUS: 0 - Asymptomatic  Vitals:   10/24/20  0838  BP: 127/71  Pulse: 82  Resp: 16  Temp: (!) 97.3 F (36.3 C)  SpO2: 98%   Filed Weights   10/24/20 0838  Weight: 176 lb 6.4 oz (80 kg)    Due to COVID19 we will limit examination to appearance. Patient had no complaints.  GENERAL:alert, no distress and comfortable SKIN: skin color normal, no rashes or significant lesions EYES: normal, Conjunctiva are pink and non-injected, sclera clear  NEURO: alert & oriented x 3 with fluent speech   LABORATORY DATA:  I have reviewed the data as listed CBC Latest Ref Rng & Units 10/22/2020 07/25/2020 04/23/2020  WBC 4.0 - 10.5 K/uL 4.7 4.1 4.3  Hemoglobin 12.0 - 15.0 g/dL 14.0 13.9 13.5  Hematocrit 36.0 - 46.0 % 39.8 38.7 38.5  Platelets 150 - 400 K/uL 156 152 148(L)     CMP Latest Ref Rng & Units 10/22/2020 07/25/2020 04/23/2020  Glucose 70 - 99 mg/dL 107(H) 95 109(H)  BUN 8 - 23 mg/dL $Remove'15 13 11  'UORYRXJ$ Creatinine 0.44 - 1.00 mg/dL 0.81 0.80 0.83  Sodium 135 - 145 mmol/L 137 138 138  Potassium 3.5 - 5.1 mmol/L 3.8 3.7 3.7  Chloride 98 - 111 mmol/L 101 101 103  CO2 22 - 32 mmol/L $RemoveB'27 30 26  'LOzeORxj$ Calcium 8.9 - 10.3 mg/dL 9.2 9.2 9.7  Total Protein 6.5 - 8.1 g/dL 7.5 7.3 7.4  Total Bilirubin 0.3 - 1.2 mg/dL 0.6 0.8 0.7  Alkaline Phos 38 - 126 U/L 42 38 43  AST 15 - 41 U/L $Remo'24 23 27  'KkTpH$ ALT 0 - 44 U/L 35 31 42      RADIOGRAPHIC STUDIES: I have personally reviewed the radiological images as listed and agreed with the findings in the report. No results found.   ASSESSMENT & PLAN:  Dawn [name] is a 70 y.o. female with   1. 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 Treated with right breast lumpectomy, adjuvant chemo and radiation.She tried Nerlynx but could not tolerate due to diarrhea,and does not want try again. -She started anti-estrogen therapy with letrozole. Due to joint pain I switched her to Anastrozole in 04/2018.She is toleratingwell with  manageable joint stiffness. Plan to continue for 7 years due to high risk disease.  -She is clinically doing well. Labs reviewed, CBC and CMP from this week WNL except BG 107. There is no clinical concern for recurrence. -Continue surveillance. Next MIRI breast in 10/2020. Mammogram  in 04/2021.  -Continue anastrozole -f/u in 4 months.   2. Endometrial adenocarcinomawith lymph node metastasis, pT1apN1M0, FIGO stage IIIc, MSI-H, probable RP node recurrence in 12/2017 -Diagnosed in 04/2017. Treated with hysterectomy, BSO, adjuvant radiation, and chemo with Carbotaxol. -She has completed 2 years of Keytruda in May 2021.She has recovered well. -I personally reviewed and discussed her CT CAP from 10/22/20 which shows No evidence of metastatic disease in the chest, abdomen or pelvis.  -Continue with surveillance. Repeat CT CAP in 9 months, then last scan 1 year later.  -She will keep port for 1 year. Continue PAC flush every 6-8 weeks. She is fine with keeping longer given she has difficult veins.  3. HTN, Acid reflux, High Cholesterol -WellControlled, Continue tof/u with PCP -She was seen to have Gastritis on 11/22/19 upper endoscopy/colonoscopy. She is on PPI.  4.Osteopenia -DEXA scan in 05/2018 revealed T score of -2.0. -She was on monthly boniva, prescribed by her PCP, since 02/2018.I switched her to Zometa q39months for 2 years starting 10/24/20. I reviewed side effects with her today.  -ContinueVitamin D and Calcium supplements.Will check her Vitamin D level every 6 months.  -She is due for DEXA in 2022  5.Metabolic syndrome, hyperglycemia, Weight gain -She has no history of diabetes.  -She was recently found to have abnormal TSH levels. She has been seen by endocrinologist and has startedlevothyroxineon 01/04/19. Will monitor TSH level. -Her weightwas trending up. [facility] has been able to lose some weight. Will continue  with change in diet and exercise. Her gaol weight is 160 pounds.  -Continue to follow-up with weight management clinic   6. Arthritis, Neuropathy  -She has arthritis, mainly in her back. She will take Tylenol as needed.  -Managed by Sports Medicine Dr. Dianah Field -She was started on B6 and alphalogic acid supplement for her neuropathy. Stable.    Plan -CT CAP reviewed, NED  -proceed with Zometa today  -Continue anastrozole  -Port flush in 2 months  -Lab, flush, F/u in 4 months  -Lab, flush, Zometa in 6 months  -screening breast MRI if she agrees (I left her a message after clinic), will get her DEXA scheduled also     No problem-specific Assessment & Plan notes found for this encounter.   No orders of the defined types were placed in this encounter.  All questions were answered. The patient knows to call the clinic with any problems, questions or concerns. No barriers to learning was detected. The total time spent in the appointment was 30 minutes.     Truitt Merle, MD 10/24/2020   I, Joslyn Devon, am acting as scribe for Truitt Merle, MD.   I have reviewed the above documentation for accuracy and completeness, and I agree with the above.

## 2020-10-21 ENCOUNTER — Other Ambulatory Visit: Payer: Self-pay | Admitting: Hematology

## 2020-10-21 DIAGNOSIS — C50211 Malignant neoplasm of upper-inner quadrant of right female breast: Secondary | ICD-10-CM

## 2020-10-21 DIAGNOSIS — Z170 Estrogen receptor positive status [ER+]: Secondary | ICD-10-CM

## 2020-10-22 ENCOUNTER — Encounter (HOSPITAL_COMMUNITY): Payer: Self-pay

## 2020-10-22 ENCOUNTER — Inpatient Hospital Stay: Payer: Medicare Other

## 2020-10-22 ENCOUNTER — Ambulatory Visit (HOSPITAL_COMMUNITY)
Admission: RE | Admit: 2020-10-22 | Discharge: 2020-10-22 | Disposition: A | Discharge status: Home / Self Care | Payer: Medicare Other | Source: Ambulatory Visit | Attending: Hematology | Admitting: Hematology

## 2020-10-22 ENCOUNTER — Inpatient Hospital Stay: Payer: Medicare Other | Attending: Hematology

## 2020-10-22 ENCOUNTER — Other Ambulatory Visit: Payer: Self-pay

## 2020-10-22 DIAGNOSIS — C50211 Malignant neoplasm of upper-inner quadrant of right female breast: Secondary | ICD-10-CM

## 2020-10-22 DIAGNOSIS — M8580 Other specified disorders of bone density and structure, unspecified site: Secondary | ICD-10-CM | POA: Insufficient documentation

## 2020-10-22 DIAGNOSIS — R946 Abnormal results of thyroid function studies: Secondary | ICD-10-CM | POA: Insufficient documentation

## 2020-10-22 DIAGNOSIS — Z9221 Personal history of antineoplastic chemotherapy: Secondary | ICD-10-CM | POA: Insufficient documentation

## 2020-10-22 DIAGNOSIS — Z79811 Long term (current) use of aromatase inhibitors: Secondary | ICD-10-CM | POA: Insufficient documentation

## 2020-10-22 DIAGNOSIS — Z90710 Acquired absence of both cervix and uterus: Secondary | ICD-10-CM | POA: Insufficient documentation

## 2020-10-22 DIAGNOSIS — Z170 Estrogen receptor positive status [ER+]: Secondary | ICD-10-CM | POA: Insufficient documentation

## 2020-10-22 DIAGNOSIS — M1990 Unspecified osteoarthritis, unspecified site: Secondary | ICD-10-CM | POA: Insufficient documentation

## 2020-10-22 DIAGNOSIS — Z8542 Personal history of malignant neoplasm of other parts of uterus: Secondary | ICD-10-CM | POA: Insufficient documentation

## 2020-10-22 DIAGNOSIS — E7800 Pure hypercholesterolemia, unspecified: Secondary | ICD-10-CM | POA: Insufficient documentation

## 2020-10-22 DIAGNOSIS — M2560 Stiffness of unspecified joint, not elsewhere classified: Secondary | ICD-10-CM | POA: Insufficient documentation

## 2020-10-22 DIAGNOSIS — Z8719 Personal history of other diseases of the digestive system: Secondary | ICD-10-CM | POA: Insufficient documentation

## 2020-10-22 DIAGNOSIS — E8881 Metabolic syndrome: Secondary | ICD-10-CM | POA: Insufficient documentation

## 2020-10-22 DIAGNOSIS — K5730 Diverticulosis of large intestine without perforation or abscess without bleeding: Secondary | ICD-10-CM | POA: Diagnosis not present

## 2020-10-22 DIAGNOSIS — K219 Gastro-esophageal reflux disease without esophagitis: Secondary | ICD-10-CM | POA: Insufficient documentation

## 2020-10-22 DIAGNOSIS — I10 Essential (primary) hypertension: Secondary | ICD-10-CM | POA: Insufficient documentation

## 2020-10-22 DIAGNOSIS — G629 Polyneuropathy, unspecified: Secondary | ICD-10-CM | POA: Insufficient documentation

## 2020-10-22 DIAGNOSIS — C541 Malignant neoplasm of endometrium: Secondary | ICD-10-CM | POA: Diagnosis not present

## 2020-10-22 DIAGNOSIS — Z923 Personal history of irradiation: Secondary | ICD-10-CM | POA: Insufficient documentation

## 2020-10-22 DIAGNOSIS — Z79899 Other long term (current) drug therapy: Secondary | ICD-10-CM | POA: Insufficient documentation

## 2020-10-22 DIAGNOSIS — Z95828 Presence of other vascular implants and grafts: Secondary | ICD-10-CM

## 2020-10-22 DIAGNOSIS — R918 Other nonspecific abnormal finding of lung field: Secondary | ICD-10-CM | POA: Diagnosis not present

## 2020-10-22 LAB — CMP (CANCER CENTER ONLY)
ALT: 35 U/L (ref 0–44)
AST: 24 U/L (ref 15–41)
Albumin: 4.1 g/dL (ref 3.5–5.0)
Alkaline Phosphatase: 42 U/L (ref 38–126)
Anion gap: 9 (ref 5–15)
BUN: 15 mg/dL (ref 8–23)
CO2: 27 mmol/L (ref 22–32)
Calcium: 9.2 mg/dL (ref 8.9–10.3)
Chloride: 101 mmol/L (ref 98–111)
Creatinine: 0.81 mg/dL (ref 0.44–1.00)
GFR, Estimated: >60 mL/min (ref >60)
Glucose, Bld: 107 mg/dL — ABNORMAL HIGH (ref 70–99)
Potassium: 3.8 mmol/L (ref 3.5–5.1)
Sodium: 137 mmol/L (ref 135–145)
Total Bilirubin: 0.6 mg/dL (ref 0.3–1.2)
Total Protein: 7.5 g/dL (ref 6.5–8.1)

## 2020-10-22 LAB — CBC WITH DIFFERENTIAL (CANCER CENTER ONLY)
Abs Immature Granulocytes: 0.01 10*3/uL (ref 0.00–0.07)
Basophils Absolute: 0 10*3/uL (ref 0.0–0.1)
Basophils Relative: 1 %
Eosinophils Absolute: 0.1 10*3/uL (ref 0.0–0.5)
Eosinophils Relative: 2 %
HCT: 39.8 % (ref 36.0–46.0)
Hemoglobin: 14 g/dL (ref 12.0–15.0)
Immature Granulocytes: 0 %
Lymphocytes Relative: 41 %
Lymphs Abs: 1.9 10*3/uL (ref 0.7–4.0)
MCH: 34.4 pg — ABNORMAL HIGH (ref 26.0–34.0)
MCHC: 35.2 g/dL (ref 30.0–36.0)
MCV: 97.8 fL (ref 80.0–100.0)
Monocytes Absolute: 0.4 10*3/uL (ref 0.1–1.0)
Monocytes Relative: 9 %
Neutro Abs: 2.2 10*3/uL (ref 1.7–7.7)
Neutrophils Relative %: 47 %
Platelet Count: 156 10*3/uL (ref 150–400)
RBC: 4.07 MIL/uL (ref 3.87–5.11)
RDW: 11.9 % (ref 11.5–15.5)
WBC Count: 4.7 10*3/uL (ref 4.0–10.5)
nRBC: 0 % (ref 0.0–0.2)

## 2020-10-22 MED ORDER — SODIUM CHLORIDE 0.9% FLUSH
10.0000 mL | Freq: Once | INTRAVENOUS | Status: AC | PRN
  Administered 2020-10-22: 10 mL
  Filled 2020-10-22: qty 10

## 2020-10-22 MED ORDER — ALTEPLASE 2 MG IJ SOLR
2.0000 mg | Freq: Once | INTRAMUSCULAR | Status: DC | PRN
  Filled 2020-10-22: qty 2

## 2020-10-22 MED ORDER — SODIUM CHLORIDE 0.9% FLUSH
10.0000 mL | INTRAVENOUS | Status: DC | PRN
  Filled 2020-10-22: qty 10

## 2020-10-22 MED ORDER — HEPARIN SOD (PORK) LOCK FLUSH 100 UNIT/ML IV SOLN: 500.0000 [IU] | Freq: Once | INTRAVENOUS | Status: AC

## 2020-10-22 MED ORDER — IOHEXOL 300 MG/ML  SOLN
100.0000 mL | Freq: Once | INTRAMUSCULAR | Status: AC | PRN
  Administered 2020-10-22: 100 mL via INTRAVENOUS

## 2020-10-22 MED ORDER — HEPARIN SOD (PORK) LOCK FLUSH 100 UNIT/ML IV SOLN
500.0000 [IU] | Freq: Once | INTRAVENOUS | Status: DC | PRN
  Filled 2020-10-22: qty 5

## 2020-10-22 MED ORDER — HEPARIN SOD (PORK) LOCK FLUSH 100 UNIT/ML IV SOLN
INTRAVENOUS | Status: AC
  Administered 2020-10-22: 500 [IU] via INTRAVENOUS
  Filled 2020-10-22: qty 5

## 2020-10-22 NOTE — Patient Instructions (Signed)
Zoledronic Acid Injection (Hypercalcemia, Oncology) What is this medicine? ZOLEDRONIC ACID (ZOE le dron ik AS id) slows calcium loss from bones. It high calcium levels in the blood from some kinds of cancer. It may be used in other people at risk for bone loss. This medicine may be used for other purposes; ask your health care provider or pharmacist if you have questions. COMMON BRAND NAME(S): Zometa What should I tell my health care provider before I take this medicine? They need to know if you have any of these conditions:  cancer  dehydration  dental disease  kidney disease  liver disease  low levels of calcium in the blood  lung or breathing disease (asthma)  receiving steroids like dexamethasone or prednisone  an unusual or allergic reaction to zoledronic acid, other medicines, foods, dyes, or preservatives  pregnant or trying to get pregnant  breast-feeding How should I use this medicine? This drug is injected into a vein. It is given by a health care provider in a hospital or clinic setting. Talk to your health care provider about the use of this drug in children. Special care may be needed. Overdosage: If you think you have taken too much of this medicine contact a poison control center or emergency room at once. NOTE: This medicine is only for you. Do not share this medicine with others. What if I miss a dose? Keep appointments for follow-up doses. It is important not to miss your dose. Call your health care provider if you are unable to keep an appointment. What may interact with this medicine?  certain antibiotics given by injection  NSAIDs, medicines for pain and inflammation, like ibuprofen or naproxen  some diuretics like bumetanide, furosemide  teriparatide  thalidomide This list may not describe all possible interactions. Give your health care provider a list of all the medicines, herbs, non-prescription drugs, or dietary supplements you use. Also tell  them if you smoke, drink alcohol, or use illegal drugs. Some items may interact with your medicine. What should I watch for while using this medicine? Visit your health care provider for regular checks on your progress. It may be some time before you see the benefit from this drug. Some people who take this drug have severe bone, joint, or muscle pain. This drug may also increase your risk for jaw problems or a broken thigh bone. Tell your health care provider right away if you have severe pain in your jaw, bones, joints, or muscles. Tell you health care provider if you have any pain that does not go away or that gets worse. Tell your dentist and dental surgeon that you are taking this drug. You should not have major dental surgery while on this drug. See your dentist to have a dental exam and fix any dental problems before starting this drug. Take good care of your teeth while on this drug. Make sure you see your dentist for regular follow-up appointments. You should make sure you get enough calcium and vitamin D while you are taking this drug. Discuss the foods you eat and the vitamins you take with your health care provider. Check with your health care provider if you have severe diarrhea, nausea, and vomiting, or if you sweat a lot. The loss of too much body fluid may make it dangerous for you to take this drug. You may need blood work done while you are taking this drug. Do not become pregnant while taking this drug. Women should inform their health care provider   if they wish to become pregnant or think they might be pregnant. There is potential for serious harm to an unborn child. Talk to your health care provider for more information. What side effects may I notice from receiving this medicine? Side effects that you should report to your doctor or health care provider as soon as possible:  allergic reactions (skin rash, itching or hives; swelling of the face, lips, or tongue)  bone  pain  infection (fever, chills, cough, sore throat, pain or trouble passing urine)  jaw pain, especially after dental work  joint pain  kidney injury (trouble passing urine or change in the amount of urine)  low blood pressure (dizziness; feeling faint or lightheaded, falls; unusually weak or tired)  low calcium levels (fast heartbeat; muscle cramps or pain; pain, tingling, or numbness in the hands or feet; seizures)  low magnesium levels (fast, irregular heartbeat; muscle cramp or pain; muscle weakness; tremors; seizures)  low red blood cell counts (trouble breathing; feeling faint; lightheaded, falls; unusually weak or tired)  muscle pain  redness, blistering, peeling, or loosening of the skin, including inside the mouth  severe diarrhea  swelling of the ankles, feet, hands  trouble breathing Side effects that usually do not require medical attention (report to your doctor or health care provider if they continue or are bothersome):  anxious  constipation  coughing  depressed mood  eye irritation, itching, or pain  fever  general ill feeling or flu-like symptoms  nausea  pain, redness, or irritation at site where injected  trouble sleeping This list may not describe all possible side effects. Call your doctor for medical advice about side effects. You may report side effects to FDA at 1-800-FDA-1088. Where should I keep my medicine? This drug is given in a hospital or clinic. It will not be stored at home. NOTE: This sheet is a summary. It may not cover all possible information. If you have questions about this medicine, talk to your doctor, pharmacist, or health care provider.  2021 [employer] Standard (2019-06-16 09:13:00)  

## 2020-10-24 ENCOUNTER — Inpatient Hospital Stay (HOSPITAL_BASED_OUTPATIENT_CLINIC_OR_DEPARTMENT_OTHER): Payer: Medicare Other | Admitting: Hematology

## 2020-10-24 ENCOUNTER — Encounter: Payer: Self-pay | Admitting: Hematology

## 2020-10-24 ENCOUNTER — Other Ambulatory Visit: Payer: Self-pay

## 2020-10-24 ENCOUNTER — Inpatient Hospital Stay: Payer: Medicare Other

## 2020-10-24 VITALS — BP 127/71 | HR 82 | Temp 97.3°F | Resp 16 | Ht 63.0 in | Wt 176.4 lb

## 2020-10-24 DIAGNOSIS — R946 Abnormal results of thyroid function studies: Secondary | ICD-10-CM | POA: Diagnosis not present

## 2020-10-24 DIAGNOSIS — Z90710 Acquired absence of both cervix and uterus: Secondary | ICD-10-CM | POA: Diagnosis not present

## 2020-10-24 DIAGNOSIS — C541 Malignant neoplasm of endometrium: Secondary | ICD-10-CM

## 2020-10-24 DIAGNOSIS — E8881 Metabolic syndrome: Secondary | ICD-10-CM | POA: Diagnosis not present

## 2020-10-24 DIAGNOSIS — Z170 Estrogen receptor positive status [ER+]: Secondary | ICD-10-CM | POA: Diagnosis not present

## 2020-10-24 DIAGNOSIS — C50211 Malignant neoplasm of upper-inner quadrant of right female breast: Secondary | ICD-10-CM

## 2020-10-24 DIAGNOSIS — M810 Age-related osteoporosis without current pathological fracture: Secondary | ICD-10-CM

## 2020-10-24 DIAGNOSIS — Z9221 Personal history of antineoplastic chemotherapy: Secondary | ICD-10-CM | POA: Diagnosis not present

## 2020-10-24 DIAGNOSIS — Z95828 Presence of other vascular implants and grafts: Secondary | ICD-10-CM

## 2020-10-24 DIAGNOSIS — E7800 Pure hypercholesterolemia, unspecified: Secondary | ICD-10-CM | POA: Diagnosis not present

## 2020-10-24 DIAGNOSIS — M8580 Other specified disorders of bone density and structure, unspecified site: Secondary | ICD-10-CM | POA: Diagnosis not present

## 2020-10-24 DIAGNOSIS — I10 Essential (primary) hypertension: Secondary | ICD-10-CM | POA: Diagnosis not present

## 2020-10-24 DIAGNOSIS — G629 Polyneuropathy, unspecified: Secondary | ICD-10-CM | POA: Diagnosis not present

## 2020-10-24 DIAGNOSIS — Z923 Personal history of irradiation: Secondary | ICD-10-CM | POA: Diagnosis not present

## 2020-10-24 DIAGNOSIS — K219 Gastro-esophageal reflux disease without esophagitis: Secondary | ICD-10-CM | POA: Diagnosis not present

## 2020-10-24 DIAGNOSIS — M2560 Stiffness of unspecified joint, not elsewhere classified: Secondary | ICD-10-CM | POA: Diagnosis not present

## 2020-10-24 DIAGNOSIS — Z79811 Long term (current) use of aromatase inhibitors: Secondary | ICD-10-CM | POA: Diagnosis not present

## 2020-10-24 DIAGNOSIS — Z8542 Personal history of malignant neoplasm of other parts of uterus: Secondary | ICD-10-CM | POA: Diagnosis not present

## 2020-10-24 DIAGNOSIS — M1990 Unspecified osteoarthritis, unspecified site: Secondary | ICD-10-CM | POA: Diagnosis not present

## 2020-10-24 DIAGNOSIS — Z79899 Other long term (current) drug therapy: Secondary | ICD-10-CM | POA: Diagnosis not present

## 2020-10-24 DIAGNOSIS — Z8719 Personal history of other diseases of the digestive system: Secondary | ICD-10-CM | POA: Diagnosis not present

## 2020-10-24 MED ORDER — SODIUM CHLORIDE 0.9% FLUSH
10.0000 mL | Freq: Once | INTRAVENOUS | Status: AC | PRN
  Administered 2020-10-24: 10 mL
  Filled 2020-10-24: qty 10

## 2020-10-24 MED ORDER — SODIUM CHLORIDE 0.9 % IV SOLN
Freq: Once | INTRAVENOUS | Status: AC
  Filled 2020-10-24: qty 250

## 2020-10-24 MED ORDER — ZOLEDRONIC ACID 4 MG/100ML IV SOLN
INTRAVENOUS | Status: AC
  Filled 2020-10-24: qty 100

## 2020-10-24 MED ORDER — ZOLEDRONIC ACID 4 MG/100ML IV SOLN
4.0000 mg | Freq: Once | INTRAVENOUS | Status: AC
  Administered 2020-10-24: 4 mg via INTRAVENOUS

## 2020-10-24 MED ORDER — HEPARIN SOD (PORK) LOCK FLUSH 100 UNIT/ML IV SOLN
500.0000 [IU] | Freq: Once | INTRAVENOUS | Status: AC | PRN
  Administered 2020-10-24: 500 [IU]
  Filled 2020-10-24: qty 5

## 2020-10-24 NOTE — Patient Instructions (Signed)
Zoledronic Acid Injection (Hypercalcemia, Oncology) What is this medicine? ZOLEDRONIC ACID (ZOE le dron ik AS id) slows calcium loss from bones. It high calcium levels in the blood from some kinds of cancer. It may be used in other people at risk for bone loss. This medicine may be used for other purposes; ask your health care provider or pharmacist if you have questions. COMMON BRAND NAME(S): Zometa What should I tell my health care provider before I take this medicine? They need to know if you have any of these conditions:  cancer  dehydration  dental disease  kidney disease  liver disease  low levels of calcium in the blood  lung or breathing disease (asthma)  receiving steroids like dexamethasone or prednisone  an unusual or allergic reaction to zoledronic acid, other medicines, foods, dyes, or preservatives  pregnant or trying to get pregnant  breast-feeding How should I use this medicine? This drug is injected into a vein. It is given by a health care provider in a hospital or clinic setting. Talk to your health care provider about the use of this drug in children. Special care may be needed. Overdosage: If you think you have taken too much of this medicine contact a poison control center or emergency room at once. NOTE: This medicine is only for you. Do not share this medicine with others. What if I miss a dose? Keep appointments for follow-up doses. It is important not to miss your dose. Call your health care provider if you are unable to keep an appointment. What may interact with this medicine?  certain antibiotics given by injection  NSAIDs, medicines for pain and inflammation, like ibuprofen or naproxen  some diuretics like bumetanide, furosemide  teriparatide  thalidomide This list may not describe all possible interactions. Give your health care provider a list of all the medicines, herbs, non-prescription drugs, or dietary supplements you use. Also tell  them if you smoke, drink alcohol, or use illegal drugs. Some items may interact with your medicine. What should I watch for while using this medicine? Visit your health care provider for regular checks on your progress. It may be some time before you see the benefit from this drug. Some people who take this drug have severe bone, joint, or muscle pain. This drug may also increase your risk for jaw problems or a broken thigh bone. Tell your health care provider right away if you have severe pain in your jaw, bones, joints, or muscles. Tell you health care provider if you have any pain that does not go away or that gets worse. Tell your dentist and dental surgeon that you are taking this drug. You should not have major dental surgery while on this drug. See your dentist to have a dental exam and fix any dental problems before starting this drug. Take good care of your teeth while on this drug. Make sure you see your dentist for regular follow-up appointments. You should make sure you get enough calcium and vitamin D while you are taking this drug. Discuss the foods you eat and the vitamins you take with your health care provider. Check with your health care provider if you have severe diarrhea, nausea, and vomiting, or if you sweat a lot. The loss of too much body fluid may make it dangerous for you to take this drug. You may need blood work done while you are taking this drug. Do not become pregnant while taking this drug. Women should inform their health care provider   if they wish to become pregnant or think they might be pregnant. There is potential for serious harm to an unborn child. Talk to your health care provider for more information. What side effects may I notice from receiving this medicine? Side effects that you should report to your doctor or health care provider as soon as possible:  allergic reactions (skin rash, itching or hives; swelling of the face, lips, or tongue)  bone  pain  infection (fever, chills, cough, sore throat, pain or trouble passing urine)  jaw pain, especially after dental work  joint pain  kidney injury (trouble passing urine or change in the amount of urine)  low blood pressure (dizziness; feeling faint or lightheaded, falls; unusually weak or tired)  low calcium levels (fast heartbeat; muscle cramps or pain; pain, tingling, or numbness in the hands or feet; seizures)  low magnesium levels (fast, irregular heartbeat; muscle cramp or pain; muscle weakness; tremors; seizures)  low red blood cell counts (trouble breathing; feeling faint; lightheaded, falls; unusually weak or tired)  muscle pain  redness, blistering, peeling, or loosening of the skin, including inside the mouth  severe diarrhea  swelling of the ankles, feet, hands  trouble breathing Side effects that usually do not require medical attention (report to your doctor or health care provider if they continue or are bothersome):  anxious  constipation  coughing  depressed mood  eye irritation, itching, or pain  fever  general ill feeling or flu-like symptoms  nausea  pain, redness, or irritation at site where injected  trouble sleeping This list may not describe all possible side effects. Call your doctor for medical advice about side effects. You may report side effects to FDA at 1-800-FDA-1088. Where should I keep my medicine? This drug is given in a hospital or clinic. It will not be stored at home. NOTE: This sheet is a summary. It may not cover all possible information. If you have questions about this medicine, talk to your doctor, pharmacist, or health care provider.  2021 [employer] Standard (2019-06-16 09:13:00)  

## 2020-10-29 ENCOUNTER — Other Ambulatory Visit: Payer: Self-pay

## 2020-10-31 ENCOUNTER — Encounter: Payer: Self-pay | Admitting: Hematology

## 2020-10-31 ENCOUNTER — Ambulatory Visit (INDEPENDENT_AMBULATORY_CARE_PROVIDER_SITE_OTHER): Payer: Medicare Other | Admitting: Physician Assistant

## 2020-11-08 ENCOUNTER — Telehealth: Payer: Self-pay | Admitting: Hematology

## 2020-11-08 NOTE — Telephone Encounter (Signed)
Called pt per 2/23 sch msg - no answer. Left message for patient to call back to reschedule appt.

## 2020-11-12 ENCOUNTER — Other Ambulatory Visit: Payer: Self-pay | Admitting: Hematology

## 2020-11-12 DIAGNOSIS — H18513 Endothelial corneal dystrophy, bilateral: Secondary | ICD-10-CM | POA: Diagnosis not present

## 2020-11-12 DIAGNOSIS — Z961 Presence of intraocular lens: Secondary | ICD-10-CM | POA: Diagnosis not present

## 2020-11-12 DIAGNOSIS — H43813 Vitreous degeneration, bilateral: Secondary | ICD-10-CM | POA: Diagnosis not present

## 2020-11-12 DIAGNOSIS — H5213 Myopia, bilateral: Secondary | ICD-10-CM | POA: Diagnosis not present

## 2020-11-12 DIAGNOSIS — C50211 Malignant neoplasm of upper-inner quadrant of right female breast: Secondary | ICD-10-CM

## 2020-11-12 DIAGNOSIS — Z170 Estrogen receptor positive status [ER+]: Secondary | ICD-10-CM

## 2020-11-13 ENCOUNTER — Encounter (INDEPENDENT_AMBULATORY_CARE_PROVIDER_SITE_OTHER): Payer: Self-pay

## 2020-11-13 ENCOUNTER — Telehealth: Payer: Self-pay

## 2020-11-13 NOTE — Telephone Encounter (Signed)
A vm for Dawn [name] letting her know she will receive zometa every 6 months.  I told her I would sent a scheduling message.

## 2020-11-14 ENCOUNTER — Ambulatory Visit (INDEPENDENT_AMBULATORY_CARE_PROVIDER_SITE_OTHER): Payer: Medicare Other | Admitting: Physician Assistant

## 2020-11-14 ENCOUNTER — Encounter (INDEPENDENT_AMBULATORY_CARE_PROVIDER_SITE_OTHER): Payer: Self-pay | Admitting: Physician Assistant

## 2020-11-14 ENCOUNTER — Other Ambulatory Visit: Payer: Self-pay

## 2020-11-14 ENCOUNTER — Telehealth: Payer: Self-pay | Admitting: Hematology

## 2020-11-14 VITALS — BP 117/60 | HR 86 | Temp 98.2°F | Ht 63.0 in | Wt 172.0 lb

## 2020-11-14 DIAGNOSIS — E88819 Insulin resistance, unspecified: Secondary | ICD-10-CM

## 2020-11-14 DIAGNOSIS — E8881 Metabolic syndrome: Secondary | ICD-10-CM

## 2020-11-14 DIAGNOSIS — E669 Obesity, unspecified: Secondary | ICD-10-CM | POA: Diagnosis not present

## 2020-11-14 DIAGNOSIS — Z6830 Body mass index (BMI) 30.0-30.9, adult: Secondary | ICD-10-CM | POA: Diagnosis not present

## 2020-11-14 DIAGNOSIS — E66811 Obesity, class 1: Secondary | ICD-10-CM

## 2020-11-14 DIAGNOSIS — E7849 Other hyperlipidemia: Secondary | ICD-10-CM | POA: Diagnosis not present

## 2020-11-14 NOTE — Telephone Encounter (Signed)
Scheduled appointment per 03/01 schedule message. Attempted to contact patient, left a detailed message about appointments.

## 2020-11-15 NOTE — Progress Notes (Signed)
Chief Complaint:   OBESITY Dawn [name] is here to discuss her progress with her obesity treatment plan along with follow-up of her obesity related diagnoses. Dawn [name] is on keeping a food journal and adhering to recommended goals of 1200 calories and 80 grams of protein daily and states she is following her eating plan approximately 85% of the time. [name] states she is walking for 1-2 miles 7 times per week.  Today's visit was #: 71 Starting weight: 190 lbs Starting date: 10/03/2019 Today's weight: 172 lbs Today's date: 11/14/2020 Total lbs lost to date: 18 Total lbs lost since last in-office visit: 0  Interim History: Dawn [name] has done a good job with journaling the past 2 weeks. She is not reaching her protein goal most days.  Subjective:   1. Other hyperlipidemia Othell is on Lipitor 10 mg. Last lipid panel was worsening. She is managed by her primary care physician. I discussed labs with the patient today.  2. Insulin resistance Dawn [name]'s last insulin level was 18.1, which improved from 26.4. She has decreased simple carbohydrates. I discussed labs with the patient today.  Assessment/Plan:   1. Other hyperlipidemia Cardiovascular risk and specific lipid/LDL goals reviewed. We discussed several lifestyle modifications today. [name] will continue to work on diet, exercise and weight loss efforts. [name] is to speak with her primary care physician this month at her appointment. Orders and follow up as documented in patient record.   Counseling Intensive lifestyle modifications are the first line treatment for this issue. . Dietary changes: Increase soluble fiber. Decrease simple carbohydrates. . Exercise changes: Moderate to vigorous-intensity aerobic activity 150 minutes per week if tolerated. . Lipid-lowering medications: see documented in medical record.  2. Insulin resistance [name] will continue her meal plan, and will continue to work on weight loss, exercise, and decreasing  simple carbohydrates to help decrease the risk of diabetes. [name] agreed to follow-up with [name] as directed to closely monitor her progress.  3. Class 1 obesity with serious comorbidity and body mass index (BMI) of 30.0 to 30.9 in adult, unspecified obesity type Dawn [name] is currently in the action stage of change. As such, her goal is to continue with weight loss efforts. She has agreed to keeping a food journal and adhering to recommended goals of 1200 calories and 85 grams of protein daily.   Exercise goals: As is.  Behavioral modification strategies: increasing lean protein intake and meal planning and cooking strategies.  [name] has agreed to follow-up with our clinic in 3 weeks. She was informed of the importance of frequent follow-up visits to maximize her success with intensive lifestyle modifications for her multiple health conditions.   Objective:   Blood pressure 117/60, pulse 86, temperature 98.2 F (36.8 C), height 5\' 3"  (1.6 m), weight 172 lb (78 kg), SpO2 96 %. Body mass index is 30.47 kg/m.  General: Cooperative, alert, well developed, in no acute distress. HEENT: Conjunctivae and lids unremarkable. Cardiovascular: Regular rhythm.  Lungs: Normal work of breathing. Neurologic: No focal deficits.   Lab Results  Component Value Date   CREATININE 0.81 10/22/2020   BUN 15 10/22/2020   NA 137 10/22/2020   K 3.8 10/22/2020   CL 101 10/22/2020   CO2 27 10/22/2020   Lab Results  Component Value Date   ALT 35 10/22/2020   AST 24 10/22/2020   ALKPHOS 42 10/22/2020   BILITOT 0.6 10/22/2020   Lab Results  Component Value Date   HGBA1C 5.4 10/10/2020   HGBA1C 5.5 04/30/2020  HGBA1C 5.4 10/03/2019   HGBA1C 5.4 02/09/2019   HGBA1C 5.5 06/07/2018   Lab Results  Component Value Date   INSULIN 18.1 10/10/2020   INSULIN 26.4 (H) 04/30/2020   INSULIN 24.0 10/03/2019   Lab Results  Component Value Date   TSH 1.744 07/25/2020   Lab Results  Component Value Date    CHOL 290 (H) 10/10/2020   HDL 56 10/10/2020   LDLCALC 209 (H) 10/10/2020   TRIG 137 10/10/2020   CHOLHDL 5.2 (H) 10/10/2020   Lab Results  Component Value Date   WBC 4.7 10/22/2020   HGB 14.0 10/22/2020   HCT 39.8 10/22/2020   MCV 97.8 10/22/2020   PLT 156 10/22/2020   Lab Results  Component Value Date   FERRITIN 176 12/17/2012    Obesity Behavioral Intervention:   Approximately 15 minutes were spent on the discussion below.  ASK: We discussed the diagnosis of obesity with [name] today and Emmogene agreed to give [name] permission to discuss obesity behavioral modification therapy today.  ASSESS: Dawn [name] has the diagnosis of obesity and her BMI today is 30.48. Dawn [name] is in the action stage of change.   ADVISE: [name] was educated on the multiple health risks of obesity as well as the benefit of weight loss to improve her health. She was advised of the need for long term treatment and the importance of lifestyle modifications to improve her current health and to decrease her risk of future health problems.  AGREE: Multiple dietary modification options and treatment options were discussed and Dawn [name] agreed to follow the recommendations documented in the above note.  ARRANGE: Dawn [name] was educated on the importance of frequent visits to treat obesity as outlined per CMS and USPSTF guidelines and agreed to schedule her next follow up appointment today.  Attestation Statements:   Reviewed by clinician on day of visit: allergies, medications, problem list, medical history, surgical history, family history, social history, and previous encounter notes.   [name], am acting as transcriptionist for [employer], PA-C.  I have reviewed the above documentation for accuracy and completeness, and I agree with the above. [name], PA-C

## 2020-11-22 ENCOUNTER — Other Ambulatory Visit: Payer: Self-pay | Admitting: Family Medicine

## 2020-11-22 ENCOUNTER — Other Ambulatory Visit: Payer: Self-pay | Admitting: Gastroenterology

## 2020-12-03 ENCOUNTER — Other Ambulatory Visit: Payer: Self-pay

## 2020-12-03 ENCOUNTER — Encounter (INDEPENDENT_AMBULATORY_CARE_PROVIDER_SITE_OTHER): Payer: Self-pay | Admitting: Physician Assistant

## 2020-12-03 ENCOUNTER — Ambulatory Visit (INDEPENDENT_AMBULATORY_CARE_PROVIDER_SITE_OTHER): Payer: Medicare Other | Admitting: Physician Assistant

## 2020-12-03 VITALS — BP 119/78 | HR 80 | Temp 98.2°F | Ht 63.0 in | Wt 171.0 lb

## 2020-12-03 DIAGNOSIS — E669 Obesity, unspecified: Secondary | ICD-10-CM | POA: Diagnosis not present

## 2020-12-03 DIAGNOSIS — Z6833 Body mass index (BMI) 33.0-33.9, adult: Secondary | ICD-10-CM | POA: Diagnosis not present

## 2020-12-03 DIAGNOSIS — E559 Vitamin D deficiency, unspecified: Secondary | ICD-10-CM

## 2020-12-03 DIAGNOSIS — I10 Essential (primary) hypertension: Secondary | ICD-10-CM

## 2020-12-05 ENCOUNTER — Ambulatory Visit
Admission: RE | Admit: 2020-12-05 | Discharge: 2020-12-05 | Disposition: A | Discharge status: Home / Self Care | Payer: Medicare Other | Source: Ambulatory Visit | Attending: Hematology | Admitting: Hematology

## 2020-12-05 ENCOUNTER — Other Ambulatory Visit: Payer: Self-pay

## 2020-12-05 DIAGNOSIS — Z170 Estrogen receptor positive status [ER+]: Secondary | ICD-10-CM

## 2020-12-05 DIAGNOSIS — C50211 Malignant neoplasm of upper-inner quadrant of right female breast: Secondary | ICD-10-CM

## 2020-12-05 DIAGNOSIS — N6489 Other specified disorders of breast: Secondary | ICD-10-CM | POA: Diagnosis not present

## 2020-12-05 MED ORDER — GADOBUTROL 1 MMOL/ML IV SOLN
7.0000 mL | Freq: Once | INTRAVENOUS | Status: AC | PRN
  Administered 2020-12-05: 7 mL via INTRAVENOUS

## 2020-12-06 NOTE — Progress Notes (Signed)
Chief Complaint:   OBESITY Dawn [name] is here to discuss her progress with her obesity treatment plan along with follow-up of her obesity related diagnoses. Dawn [name] is on keeping a food journal and adhering to recommended goals of 1200 calories and 85 grams of protein daily and states she is following her eating plan approximately 80% of the time. Dawn [name] states she is walking for 60 minutes 7 times per week.  Today's visit was #: 18 Starting weight: 1900 lbs Starting date: 10/03/2019 Today's weight: 171 lbs Today's date: 12/03/2020 Total lbs lost to date: 19 Total lbs lost since last in-office visit: 1  Interim History: Dawn [name] did well with weight loss. She did not journal consistently. She reports hunger after working out.  Subjective:   1. Essential hypertension Dawn [name]'s blood pressure is controlled. She is managed by her primary care physician.  2. Vitamin D deficiency Dawn [name] is on Vit D OTC, and she denies nausea, vomiting, or muscle weakness. She is walking outside regularly.  Assessment/Plan:   1. Essential hypertension Dawn [name] will continue her medications, and she will follow up with her primary care physician. She will continue working on healthy weight loss and exercise to improve blood pressure control. We will watch for signs of hypotension as she continues her lifestyle modifications.  2. Vitamin D deficiency Low Vitamin D level contributes to fatigue and are associated with obesity, breast, and colon cancer. Dawn [name] agreed to continue taking OTC Vitamin D and will follow-up for routine testing of Vitamin D, at least 2-3 times per year to avoid over-replacement.  3. Class 1 obesity with serious comorbidity and body mass index (BMI) of 33.0 to 33.9 in adult, unspecified obesity type Dawn [name] is currently in the action stage of change. As such, her goal is to continue with weight loss efforts. She has agreed to keeping a food journal and adhering to recommended goals of  1200-1300 calories and 85 grams of protein daily.   Exercise goals: As is.  Behavioral modification strategies: meal planning and cooking strategies and planning for success.  Dawn [name] has agreed to follow-up with our clinic in 3 weeks. She was informed of the importance of frequent follow-up visits to maximize her success with intensive lifestyle modifications for her multiple health conditions.   Objective:   Blood pressure 119/78, pulse 80, temperature 98.2 F (36.8 C), height 5\' 3"  (1.6 m), weight 171 lb (77.6 kg), SpO2 97 %. Body mass index is 30.29 kg/m.  General: Cooperative, alert, well developed, in no acute distress. HEENT: Conjunctivae and lids unremarkable. Cardiovascular: Regular rhythm.  Lungs: Normal work of breathing. Neurologic: No focal deficits.   Lab Results  Component Value Date   CREATININE 0.81 10/22/2020   BUN 15 10/22/2020   NA 137 10/22/2020   K 3.8 10/22/2020   CL 101 10/22/2020   CO2 27 10/22/2020   Lab Results  Component Value Date   ALT 35 10/22/2020   AST 24 10/22/2020   ALKPHOS 42 10/22/2020   BILITOT 0.6 10/22/2020   Lab Results  Component Value Date   HGBA1C 5.4 10/10/2020   HGBA1C 5.5 04/30/2020   HGBA1C 5.4 10/03/2019   HGBA1C 5.4 02/09/2019   HGBA1C 5.5 06/07/2018   Lab Results  Component Value Date   INSULIN 18.1 10/10/2020   INSULIN 26.4 (H) 04/30/2020   INSULIN 24.0 10/03/2019   Lab Results  Component Value Date   TSH 1.744 07/25/2020   Lab Results  Component Value Date   CHOL 290 (H) 10/10/2020  HDL 56 10/10/2020   LDLCALC 209 (H) 10/10/2020   TRIG 137 10/10/2020   CHOLHDL 5.2 (H) 10/10/2020   Lab Results  Component Value Date   WBC 4.7 10/22/2020   HGB 14.0 10/22/2020   HCT 39.8 10/22/2020   MCV 97.8 10/22/2020   PLT 156 10/22/2020   Lab Results  Component Value Date   FERRITIN 176 12/17/2012    Obesity Behavioral Intervention:   Approximately 15 minutes were spent on the discussion  below.  ASK: We discussed the diagnosis of obesity with Dawn [name] today and Dawn [name] agreed to give [name] permission to discuss obesity behavioral modification therapy today.  ASSESS: Dawn [name] has the diagnosis of obesity and her BMI today is 30.3. Dawn [name] is in the action stage of change.   ADVISE: Dawn [name] was educated on the multiple health risks of obesity as well as the benefit of weight loss to improve her health. She was advised of the need for long term treatment and the importance of lifestyle modifications to improve her current health and to decrease her risk of future health problems.  AGREE: Multiple dietary modification options and treatment options were discussed and Dawn [name] agreed to follow the recommendations documented in the above note.  ARRANGE: Dawn [name] was educated on the importance of frequent visits to treat obesity as outlined per CMS and USPSTF guidelines and agreed to schedule her next follow up appointment today.  Attestation Statements:   Reviewed by clinician on day of visit: allergies, medications, problem list, medical history, surgical history, family history, social history, and previous encounter notes.   [name], am acting as transcriptionist for [employer], PA-C.  I have reviewed the above documentation for accuracy and completeness, and I agree with the above. [name], PA-C

## 2020-12-10 ENCOUNTER — Ambulatory Visit: Payer: Medicare Other | Admitting: Family Medicine

## 2020-12-12 ENCOUNTER — Ambulatory Visit: Payer: Medicare Other | Admitting: Family Medicine

## 2020-12-23 ENCOUNTER — Encounter (INDEPENDENT_AMBULATORY_CARE_PROVIDER_SITE_OTHER): Payer: Self-pay

## 2020-12-24 ENCOUNTER — Inpatient Hospital Stay: Payer: Medicare Other | Attending: Hematology

## 2020-12-24 ENCOUNTER — Ambulatory Visit (INDEPENDENT_AMBULATORY_CARE_PROVIDER_SITE_OTHER): Payer: Medicare Other | Admitting: Physician Assistant

## 2020-12-24 ENCOUNTER — Other Ambulatory Visit: Payer: Self-pay

## 2020-12-24 DIAGNOSIS — M2560 Stiffness of unspecified joint, not elsewhere classified: Secondary | ICD-10-CM | POA: Diagnosis not present

## 2020-12-24 DIAGNOSIS — M1990 Unspecified osteoarthritis, unspecified site: Secondary | ICD-10-CM | POA: Diagnosis not present

## 2020-12-24 DIAGNOSIS — Z170 Estrogen receptor positive status [ER+]: Secondary | ICD-10-CM | POA: Insufficient documentation

## 2020-12-24 DIAGNOSIS — E7800 Pure hypercholesterolemia, unspecified: Secondary | ICD-10-CM | POA: Diagnosis not present

## 2020-12-24 DIAGNOSIS — K219 Gastro-esophageal reflux disease without esophagitis: Secondary | ICD-10-CM | POA: Diagnosis not present

## 2020-12-24 DIAGNOSIS — Z8719 Personal history of other diseases of the digestive system: Secondary | ICD-10-CM | POA: Diagnosis not present

## 2020-12-24 DIAGNOSIS — Z923 Personal history of irradiation: Secondary | ICD-10-CM | POA: Diagnosis not present

## 2020-12-24 DIAGNOSIS — Z90710 Acquired absence of both cervix and uterus: Secondary | ICD-10-CM | POA: Insufficient documentation

## 2020-12-24 DIAGNOSIS — Z9221 Personal history of antineoplastic chemotherapy: Secondary | ICD-10-CM | POA: Diagnosis not present

## 2020-12-24 DIAGNOSIS — M8580 Other specified disorders of bone density and structure, unspecified site: Secondary | ICD-10-CM | POA: Insufficient documentation

## 2020-12-24 DIAGNOSIS — G629 Polyneuropathy, unspecified: Secondary | ICD-10-CM | POA: Diagnosis not present

## 2020-12-24 DIAGNOSIS — E8881 Metabolic syndrome: Secondary | ICD-10-CM | POA: Insufficient documentation

## 2020-12-24 DIAGNOSIS — Z8542 Personal history of malignant neoplasm of other parts of uterus: Secondary | ICD-10-CM | POA: Insufficient documentation

## 2020-12-24 DIAGNOSIS — C50211 Malignant neoplasm of upper-inner quadrant of right female breast: Secondary | ICD-10-CM | POA: Diagnosis not present

## 2020-12-24 DIAGNOSIS — I10 Essential (primary) hypertension: Secondary | ICD-10-CM | POA: Diagnosis not present

## 2020-12-24 DIAGNOSIS — Z79811 Long term (current) use of aromatase inhibitors: Secondary | ICD-10-CM | POA: Insufficient documentation

## 2020-12-24 DIAGNOSIS — R946 Abnormal results of thyroid function studies: Secondary | ICD-10-CM | POA: Diagnosis not present

## 2020-12-24 DIAGNOSIS — Z79899 Other long term (current) drug therapy: Secondary | ICD-10-CM | POA: Diagnosis not present

## 2020-12-24 DIAGNOSIS — Z452 Encounter for adjustment and management of vascular access device: Secondary | ICD-10-CM | POA: Diagnosis not present

## 2021-01-02 ENCOUNTER — Ambulatory Visit (INDEPENDENT_AMBULATORY_CARE_PROVIDER_SITE_OTHER): Payer: Medicare Other | Admitting: Family Medicine

## 2021-01-02 ENCOUNTER — Encounter: Payer: Self-pay | Admitting: Family Medicine

## 2021-01-02 ENCOUNTER — Other Ambulatory Visit: Payer: Self-pay

## 2021-01-02 VITALS — BP 130/71 | HR 70 | Ht 63.0 in | Wt 176.0 lb

## 2021-01-02 DIAGNOSIS — R7301 Impaired fasting glucose: Secondary | ICD-10-CM

## 2021-01-02 DIAGNOSIS — G620 Drug-induced polyneuropathy: Secondary | ICD-10-CM | POA: Insufficient documentation

## 2021-01-02 DIAGNOSIS — E039 Hypothyroidism, unspecified: Secondary | ICD-10-CM

## 2021-01-02 DIAGNOSIS — I10 Essential (primary) hypertension: Secondary | ICD-10-CM

## 2021-01-02 DIAGNOSIS — I700 Atherosclerosis of aorta: Secondary | ICD-10-CM | POA: Diagnosis not present

## 2021-01-02 DIAGNOSIS — E7849 Other hyperlipidemia: Secondary | ICD-10-CM

## 2021-01-02 NOTE — Assessment & Plan Note (Signed)
Plans to check TSH with onc this summer.

## 2021-01-02 NOTE — Assessment & Plan Note (Signed)
Last A1C looks great!! Continue to work on healthy deit.

## 2021-01-02 NOTE — Assessment & Plan Note (Signed)
Well controlled. Continue current regimen. Follow up in  6 months.  

## 2021-01-02 NOTE — Progress Notes (Signed)
Established Patient Office Visit  Subjective:  Patient ID: Dawn [name], female    DOB: [birth year]  Age: 70 y.o. MRN: [id number]  CC:  Chief Complaint  Patient presents with  . Hypertension    HPI [name] presents for   Hypertension- Pt denies chest pain, SOB, dizziness, or heart palpitations.  Taking meds as directed w/o problems.  Denies medication side effects.    Impaired fasting glucose-no increased thirst or urination. No symptoms consistent with hypoglycemia.  Hypothyroidism - Taking medication regularly in the AM away from food and vitamins, etc. No recent change to skin, hair, or energy levels.  Says her oncologist will check her thyroid level this summer.  Hyperlipidemia - tolerating stating well with no myalgias or significant side effects.  Lab Results  Component Value Date   CHOL 290 (H) 10/10/2020   HDL 56 10/10/2020   LDLCALC 209 (H) 10/10/2020   TRIG 137 10/10/2020   CHOLHDL 5.2 (H) 10/10/2020       Past Medical History:  Diagnosis Date  . Anemia    as a child.  . Arthritis   . Bilateral cataracts   . Bilateral leg cramps   . Cancer ([place])    skin  . Dyspnea   . Endometrial cancer ([place])   . Family history of bladder cancer   . Family history of colon cancer in father   . Fatty liver   . Fuchs' corneal dystrophy   . GERD (gastroesophageal reflux disease)   . Hearing loss    Right side 30%  . Heartburn   . History of hiatal hernia    small size  . History of radiation therapy 08/21/17, 08/28/17,09/01/17, 09/11/17, 09/17/17   Vaginal cuff brachytherapy.   Marland Kitchen History of radiation therapy 11/11/17- 12/08/17   Right Breast treated to 40.05 Gy with 15 fx of 2.67 Gy and a boost of 10 Gy with 5 fx.   . Hyperlipidemia   . Hypertension   . Hypothyroidism   . IBS (irritable bowel syndrome)    hx of  . 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 NAFL (nonalcoholic fatty liver)   . Obesity   .  Osteoarthritis   . Osteopenia   . PONV (postoperative nausea and vomiting)   . 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 Uterine cancer ([place]) 04/2017   endometrial cancer  . Varices, gastric   . Vertigo     Past Surgical History:  Procedure Laterality Date  . BREAST BIOPSY Right 04/27/2017  . BREAST LUMPECTOMY WITH RADIOACTIVE SEED AND SENTINEL LYMPH NODE BIOPSY Right 05/25/2017   Procedure: RIGHT BREAST LUMPECTOMY WITH RADIOACTIVE SEED AND  RIGHT AXILLARY SENTINEL LYMPH NODE BIOPSY;  Surgeon: Excell Seltzer, MD;  Location: [facility];  Service: General;  Laterality: Right;  . CATARACT EXTRACTION, BILATERAL    . COLONOSCOPY    . HYSTEROSCOPY WITH D & C N/A 04/29/2017   Procedure: DILATATION AND CURETTAGE /HYSTEROSCOPY;  Surgeon: Linda Hedges, DO;  Location: [facility];  Service: Gynecology;  Laterality: N/A;  . PORTACATH PLACEMENT Left 05/25/2017   Procedure: INSERTION PORT-A-CATH;  Surgeon: Excell Seltzer, MD;  Location: [facility];  Service: General;  Laterality: Left;  . ROBOTIC ASSISTED TOTAL HYSTERECTOMY WITH BILATERAL SALPINGO OOPHERECTOMY N/A 06/09/2017   Procedure: XI ROBOTIC ASSISTED TOTAL LAPOROSCOPIC HYSTERECTOMY WITH BILATERAL SALPINGO OOPHORECTOMY;  Surgeon: Everitt Amber, MD;  Location: [facility];  Service: Gynecology;  Laterality: N/A;  . SENTINEL NODE BIOPSY N/A 06/09/2017   Procedure: SENTINEL NODE BIOPSY;  Surgeon: Everitt Amber, MD;  Location: [facility];  Service: Gynecology;  Laterality: N/A;    Family History  Problem Relation Age of Onset  . Colon cancer Father 9  . Heart attack Father 58  . Prostate cancer Father        dx 67's  . Bladder Cancer Father 51  . Hypertension Father   . Hyperlipidemia Father   . Heart disease Father   . Obesity Father   . Stroke Mother 12  . Depression Mother   . Anxiety disorder Mother   . Bipolar disorder Mother   . Obesity Mother   . Diabetes Maternal Grandfather   .  Kidney disease Maternal Grandfather   . Asthma Brother   . Cancer Paternal Grandmother 17       GYN cancer ( thinks ovarian, maybe uterine)  . Heart attack Maternal Grandmother 70  . Breast cancer Other 34  . Colon cancer Other 94  . Cancer Other        type unk, age dx unk    Social History   Socioeconomic History  . Marital status: Married    Spouse name: Not on file  . Number of children: Not on file  . Years of education: Not on file  . Highest education level: Not on file  Occupational History    Employer: [employer]    Comment: [employer]   Tobacco Use  . Smoking status: Never Smoker  . Smokeless tobacco: Never Used  Vaping Use  . Vaping Use: Never used  Substance and Sexual Activity  . Alcohol use: Not Currently    Alcohol/week: 0.0 standard drinks    Comment: <1/week wine or beer occasional  . Drug use: No  . Sexual activity: Yes    Birth control/protection: Post-menopausal  Other Topics Concern  . Not on file  Social History Narrative   2-3 caffeine drinks per day. Regular exercise.  Walking 3 miles a day.    Social Determinants of Health   Financial Resource Strain: Not on file  Food Insecurity: Not on file  Transportation Needs: Not on file  Physical Activity: Not on file  Stress: Not on file  Social Connections: Not on file  Intimate Partner Violence: Not on file    Outpatient Medications Prior to Visit  Medication Sig Dispense Refill  . acetaminophen (TYLENOL) 500 MG tablet Take 500 mg by mouth every 6 (six) hours as needed for moderate pain.    . Alpha-Lipoic Acid 600 MG CAPS Take 1 capsule (600 mg total) by mouth daily. 90 capsule 3  . AMBULATORY NON FORMULARY MEDICATION Medication Name: Shingrix IM x 1.  Repeat in 2-6 months. 1 Units 0  . anastrozole (ARIMIDEX) 1 MG tablet TAKE 1 TABLET BY MOUTH  DAILY 90 tablet 3  . Ascorbic Acid (VITAMIN C) 1000 MG tablet Take 1,000 mg by mouth daily.    Marland Kitchen atorvastatin (LIPITOR) 10  MG tablet TAKE 1 TABLET BY MOUTH  DAILY 90 tablet 3  . b complex vitamins capsule Take 1 capsule by mouth daily.    . Cholecalciferol (VITAMIN D3) 75 MCG (3000 UT) TABS Take 1 capsule by mouth daily.    . diphenhydrAMINE (BENADRYL) 25 MG tablet Take 25-50 mg by mouth every 6 (six) hours as needed for sleep.    . fluticasone (FLONASE) 50 MCG/ACT nasal spray Place 2 sprays into both nostrils  daily.    . gabapentin (NEURONTIN) 100 MG capsule TAKE 1 CAPSULE BY MOUTH 3  TIMES DAILY 180 capsule 5  . levothyroxine (SYNTHROID) 50 MCG tablet TAKE 1 TABLET BY MOUTH  DAILY 90 tablet 3  . lidocaine-prilocaine (EMLA) cream APPLY TO AFFECTED AREA ONCE 30 g 2  . Multiple Vitamin (MULTIVITAMIN) capsule Take 1 capsule by mouth daily.    Marland Kitchen olmesartan-hydrochlorothiazide (BENICAR HCT) 20-12.5 MG tablet TAKE 1 TABLET BY MOUTH  DAILY 90 tablet 3  . pantoprazole (PROTONIX) 20 MG tablet TAKE 1 TABLET BY MOUTH  DAILY 90 tablet 3  . potassium chloride SA (KLOR-CON) 20 MEQ tablet TAKE 1 TABLET BY MOUTH  DAILY 90 tablet 3  . pyridOXINE (VITAMIN B-6) 50 MG tablet Take 1 tablet (50 mg total) by mouth daily. 90 tablet 3  . sodium chloride (MURO 128) 5 % ophthalmic solution Place 2 drops into both eyes daily.     No facility-administered medications prior to visit.    Allergies  Allergen Reactions  . Ciprofloxacin Hives  . Paclitaxel Rash  . Crestor [Rosuvastatin Calcium] Other (See Comments)    Myalgias   . Fosamax [Alendronate Sodium] Other (See Comments)    reflux  . Livalo [Pitavastatin] Other (See Comments)    Difficulty walking. Weakness in legs.   . Penicillins Rash    Has patient had a PCN reaction causing immediate rash, facial/tongue/throat swelling, SOB or lightheadedness with hypotension: Yes Has patient had a PCN reaction causing severe rash involving mucus membranes or skin necrosis: Yes Has patient had a PCN reaction that required hospitalization: No Has patient had a PCN reaction occurring within  the last 10 years: No If all of the above answers are "NO", then may proceed with Cephalosporin use.     ROS Review of Systems    Objective:    Physical Exam Constitutional:      Appearance: She is well-developed.  HENT:     Head: Normocephalic and atraumatic.  Cardiovascular:     Rate and Rhythm: Normal rate and regular rhythm.     Heart sounds: Normal heart sounds.  Pulmonary:     Effort: Pulmonary effort is normal.     Breath sounds: Normal breath sounds.  Skin:    General: Skin is warm and dry.  Neurological:     Mental Status: She is alert and oriented to person, place, and time.  Psychiatric:        Behavior: Behavior normal.     BP 130/71   Pulse 70   Ht $R'5\' 3"'yq$  (1.6 m)   Wt 176 lb (79.8 kg)   SpO2 99%   BMI 31.18 kg/m  Wt Readings from Last 3 Encounters:  01/02/21 176 lb (79.8 kg)  12/03/20 171 lb (77.6 kg)  11/14/20 172 lb (78 kg)     There are no preventive care reminders to display for this patient.  There are no preventive care reminders to display for this patient.  Lab Results  Component Value Date   TSH 1.744 07/25/2020   Lab Results  Component Value Date   WBC 4.7 10/22/2020   HGB 14.0 10/22/2020   HCT 39.8 10/22/2020   MCV 97.8 10/22/2020   PLT 156 10/22/2020   Lab Results  Component Value Date   NA 137 10/22/2020   K 3.8 10/22/2020   CHLORIDE 101 09/02/2017   CO2 27 10/22/2020   GLUCOSE 107 (H) 10/22/2020   BUN 15 10/22/2020   CREATININE 0.81 10/22/2020   BILITOT 0.6  10/22/2020   ALKPHOS 42 10/22/2020   AST 24 10/22/2020   ALT 35 10/22/2020   PROT 7.5 10/22/2020   ALBUMIN 4.1 10/22/2020   CALCIUM 9.2 10/22/2020   ANIONGAP 9 10/22/2020   EGFR >60 09/02/2017   Lab Results  Component Value Date   CHOL 290 (H) 10/10/2020   Lab Results  Component Value Date   HDL 56 10/10/2020   Lab Results  Component Value Date   LDLCALC 209 (H) 10/10/2020   Lab Results  Component Value Date   TRIG 137 10/10/2020   Lab Results   Component Value Date   CHOLHDL 5.2 (H) 10/10/2020   Lab Results  Component Value Date   HGBA1C 5.4 10/10/2020      Assessment & Plan:   Problem List Items Addressed This Visit      Cardiovascular and Mediastinum   HYPERTENSION, BENIGN - Primary    Well controlled. Continue current regimen. Follow up in  6 months.        Relevant Orders   Lipid panel   COMPLETE METABOLIC PANEL WITH GFR   Aortic atherosclerosis (HCC)     Endocrine   IFG (impaired fasting glucose)    Last A1C looks great!! Continue to work on healthy deit.        Hypothyroid    Plans to check TSH with onc this summer.          Nervous and Auditory   Drug-induced polyneuropathy (HCC)    Still dealing with numbness, worse overnight.  No significant pain. Continue B6 and alpha lipoic acid.  Don't recommend other agents at this point unless bc more painful.         Other   Hyperlipidemia    Tolerating new start of statin well. Due to recheck level.s      Relevant Orders   Lipid panel   COMPLETE METABOLIC PANEL WITH GFR      Declined Pneumovax 20 today but plans to get later this year.     No orders of the defined types were placed in this encounter.   Follow-up: Return in about 6 months (around 07/04/2021) for Hypertension.    Beatrice Lecher, MD

## 2021-01-02 NOTE — Assessment & Plan Note (Signed)
Tolerating new start of statin well. Due to recheck level.s

## 2021-01-02 NOTE — Assessment & Plan Note (Signed)
Still dealing with numbness, worse overnight.  No significant pain. Continue B6 and alpha lipoic acid.  Don't recommend other agents at this point unless bc more painful.

## 2021-01-14 ENCOUNTER — Ambulatory Visit (INDEPENDENT_AMBULATORY_CARE_PROVIDER_SITE_OTHER): Payer: Medicare Other | Admitting: Physician Assistant

## 2021-01-28 ENCOUNTER — Other Ambulatory Visit: Payer: Self-pay

## 2021-01-28 ENCOUNTER — Encounter (INDEPENDENT_AMBULATORY_CARE_PROVIDER_SITE_OTHER): Payer: Self-pay | Admitting: Physician Assistant

## 2021-01-28 ENCOUNTER — Ambulatory Visit (INDEPENDENT_AMBULATORY_CARE_PROVIDER_SITE_OTHER): Payer: Medicare Other | Admitting: Physician Assistant

## 2021-01-28 VITALS — BP 114/73 | HR 74 | Temp 98.3°F | Ht 63.0 in | Wt 174.0 lb

## 2021-01-28 DIAGNOSIS — E559 Vitamin D deficiency, unspecified: Secondary | ICD-10-CM | POA: Diagnosis not present

## 2021-01-28 DIAGNOSIS — E669 Obesity, unspecified: Secondary | ICD-10-CM

## 2021-01-28 DIAGNOSIS — Z6833 Body mass index (BMI) 33.0-33.9, adult: Secondary | ICD-10-CM | POA: Diagnosis not present

## 2021-01-28 NOTE — Progress Notes (Signed)
Chief Complaint:   OBESITY Dawn [name] is here to discuss her progress with her obesity treatment plan along with follow-up of her obesity related diagnoses. Dawn [name] is on keeping a food journal and adhering to recommended goals of 1200-1300 calories and 85 grams of protein daily and states she is following her eating plan approximately 70% of the time. Dawn [name] states she is walking 1-2 miles 5 times per week.   Today's visit was #: 30 Starting weight: 190 lbs Starting date: 10/03/2019 Today's weight: 174 lbs Today's date: 01/28/2021 Total lbs lost to date: 16 Total lbs lost since last in-office visit: 0  Interim History: Dawn [name] has been on vacation and then attended a wedding, and did not eat on the plan and did not journal. She has been drinking sweet tea.  Subjective:   1. Vitamin D deficiency Dawn [name] is on daily Vit D, and she is tolerating it well.  Assessment/Plan:   1. Vitamin D deficiency Low Vitamin D level contributes to fatigue and are associated with obesity, breast, and colon cancer. Dawn [name] agreed to continue taking OTC Vitamin D and we will recheck labs at her next visit. She will follow-up for routine testing of Vitamin D, at least 2-3 times per year to avoid over-replacement.  2. Class 1 obesity with serious comorbidity and body mass index (BMI) of 33.0 to 33.9 in adult, unspecified obesity type Dawn [name] is currently in the action stage of change. As such, her goal is to continue with weight loss efforts. She has agreed to the Category 2 Plan.   We will recheck fasting labs at her next visit.  Exercise goals: As is.  Behavioral modification strategies: decreasing simple carbohydrates and meal planning and cooking strategies.  Dawn [name] has agreed to follow-up with our clinic in 2 to 3 weeks. She was informed of the importance of frequent follow-up visits to maximize her success with intensive lifestyle modifications for her multiple health conditions.   Objective:    Blood pressure 114/73, pulse 74, temperature 98.3 F (36.8 C), temperature source Oral, height 5\' 3"  (1.6 m), weight 174 lb (78.9 kg), SpO2 94 %. Body mass index is 30.82 kg/m.  General: Cooperative, alert, well developed, in no acute distress. HEENT: Conjunctivae and lids unremarkable. Cardiovascular: Regular rhythm.  Lungs: Normal work of breathing. Neurologic: No focal deficits.   Lab Results  Component Value Date   CREATININE 0.81 10/22/2020   BUN 15 10/22/2020   NA 137 10/22/2020   K 3.8 10/22/2020   CL 101 10/22/2020   CO2 27 10/22/2020   Lab Results  Component Value Date   ALT 35 10/22/2020   AST 24 10/22/2020   ALKPHOS 42 10/22/2020   BILITOT 0.6 10/22/2020   Lab Results  Component Value Date   HGBA1C 5.4 10/10/2020   HGBA1C 5.5 04/30/2020   HGBA1C 5.4 10/03/2019   HGBA1C 5.4 02/09/2019   HGBA1C 5.5 06/07/2018   Lab Results  Component Value Date   INSULIN 18.1 10/10/2020   INSULIN 26.4 (H) 04/30/2020   INSULIN 24.0 10/03/2019   Lab Results  Component Value Date   TSH 1.744 07/25/2020   Lab Results  Component Value Date   CHOL 290 (H) 10/10/2020   HDL 56 10/10/2020   LDLCALC 209 (H) 10/10/2020   TRIG 137 10/10/2020   CHOLHDL 5.2 (H) 10/10/2020   Lab Results  Component Value Date   WBC 4.7 10/22/2020   HGB 14.0 10/22/2020   HCT 39.8 10/22/2020   MCV 97.8 10/22/2020  PLT 156 10/22/2020   Lab Results  Component Value Date   FERRITIN 176 12/17/2012    Obesity Behavioral Intervention:   Approximately 15 minutes were spent on the discussion below.  ASK: We discussed the diagnosis of obesity with Dawn [name] today and Dawn [name] agreed to give [name] permission to discuss obesity behavioral modification therapy today.  ASSESS: Dawn [name] has the diagnosis of obesity and her BMI today is 30.83. Dawn [name] is in the action stage of change.   ADVISE: Dawn [name] was educated on the multiple health risks of obesity as well as the benefit of weight loss to improve  her health. She was advised of the need for long term treatment and the importance of lifestyle modifications to improve her current health and to decrease her risk of future health problems.  AGREE: Multiple dietary modification options and treatment options were discussed and Dawn [name] agreed to follow the recommendations documented in the above note.  ARRANGE: Dawn [name] was educated on the importance of frequent visits to treat obesity as outlined per CMS and USPSTF guidelines and agreed to schedule her next follow up appointment today.  Attestation Statements:   Reviewed by clinician on day of visit: allergies, medications, problem list, medical history, surgical history, family history, social history, and previous encounter notes.   [name], am acting as transcriptionist for [employer], PA-C.  I have reviewed the above documentation for accuracy and completeness, and I agree with the above. [name], PA-C

## 2021-02-06 ENCOUNTER — Encounter: Payer: Self-pay | Admitting: Physician Assistant

## 2021-02-06 ENCOUNTER — Ambulatory Visit (INDEPENDENT_AMBULATORY_CARE_PROVIDER_SITE_OTHER): Payer: Medicare Other | Admitting: Physician Assistant

## 2021-02-06 ENCOUNTER — Other Ambulatory Visit: Payer: Self-pay

## 2021-02-06 DIAGNOSIS — Z1283 Encounter for screening for malignant neoplasm of skin: Secondary | ICD-10-CM | POA: Diagnosis not present

## 2021-02-06 DIAGNOSIS — L918 Other hypertrophic disorders of the skin: Secondary | ICD-10-CM | POA: Diagnosis not present

## 2021-02-06 DIAGNOSIS — L820 Inflamed seborrheic keratosis: Secondary | ICD-10-CM

## 2021-02-06 DIAGNOSIS — Z86018 Personal history of other benign neoplasm: Secondary | ICD-10-CM | POA: Diagnosis not present

## 2021-02-06 NOTE — Progress Notes (Signed)
   Follow-Up Visit   Subjective  Dawn [name] is a 70 y.o. female who presents for the following: Annual Exam (Lower leg left pink area x months, check face no real concerns. History of atypical moles).   The following portions of the chart were reviewed this encounter and updated as appropriate:  Tobacco  Allergies  Meds  Problems  Med Hx  Surg Hx  Fam Hx      Objective  Well appearing patient in no apparent distress; mood and affect are within normal limits.  A full examination was performed including scalp, head, eyes, ears, nose, lips, neck, chest, axillae, abdomen, back, buttocks, bilateral upper extremities, bilateral lower extremities, hands, feet, fingers, toes, fingernails, and toenails. All findings within normal limits unless otherwise noted below.  Objective  Right Upper Eyelid: Fleshy, skin-colored sessile and pedunculated papules.    Objective  Head to Toe: No atypical nevi or signs of NMSC noted at the time of the visit.   Objective  Left Lower Leg - Anterior: Erythematous stuck-on, waxy papule or plaque.    Assessment & Plan  Skin tag Right Upper Eyelid  Observe   Skin exam for malignant neoplasm Head to Toe  Yearly skin check  Inflamed seborrheic keratosis Left Lower Leg - Anterior  Destruction of lesion - Left Lower Leg - Anterior Complexity: simple   Destruction method: cryotherapy   Informed consent: discussed and consent obtained   Timeout:  patient name, date of birth, surgical site, and procedure verified Lesion destroyed using liquid nitrogen: Yes   Cryotherapy cycles:  3 Outcome: patient tolerated procedure well with no complications   Post-procedure details: wound care instructions given      I, Shawneen Deetz, PA-C, have reviewed all documentation's for this visit.  The documentation on 02/06/21 for the exam, diagnosis, procedures and orders are all accurate and complete.

## 2021-02-07 ENCOUNTER — Telehealth: Payer: Self-pay | Admitting: Hematology

## 2021-02-07 NOTE — Telephone Encounter (Signed)
Left message with rescheduled upcoming appointment due to provider's breast clinic. Gave option to call back to reschedule if needed.

## 2021-02-08 ENCOUNTER — Telehealth: Payer: Self-pay | Admitting: Hematology

## 2021-02-08 NOTE — Telephone Encounter (Signed)
R/s appts per 5/27 sch msg. Pt aware.

## 2021-02-18 ENCOUNTER — Other Ambulatory Visit: Payer: Self-pay

## 2021-02-18 ENCOUNTER — Encounter (INDEPENDENT_AMBULATORY_CARE_PROVIDER_SITE_OTHER): Payer: Self-pay | Admitting: Physician Assistant

## 2021-02-18 ENCOUNTER — Ambulatory Visit (INDEPENDENT_AMBULATORY_CARE_PROVIDER_SITE_OTHER): Payer: Medicare Other | Admitting: Physician Assistant

## 2021-02-18 VITALS — BP 117/75 | HR 69 | Temp 97.9°F | Ht 63.0 in | Wt 172.0 lb

## 2021-02-18 DIAGNOSIS — E7849 Other hyperlipidemia: Secondary | ICD-10-CM

## 2021-02-18 DIAGNOSIS — R7303 Prediabetes: Secondary | ICD-10-CM | POA: Diagnosis not present

## 2021-02-18 DIAGNOSIS — E669 Obesity, unspecified: Secondary | ICD-10-CM | POA: Diagnosis not present

## 2021-02-18 DIAGNOSIS — Z6833 Body mass index (BMI) 33.0-33.9, adult: Secondary | ICD-10-CM | POA: Diagnosis not present

## 2021-02-18 DIAGNOSIS — E559 Vitamin D deficiency, unspecified: Secondary | ICD-10-CM | POA: Diagnosis not present

## 2021-02-18 DIAGNOSIS — E039 Hypothyroidism, unspecified: Secondary | ICD-10-CM

## 2021-02-19 LAB — COMPREHENSIVE METABOLIC PANEL
ALT: 36 IU/L — ABNORMAL HIGH (ref 0–32)
AST: 23 IU/L (ref 0–40)
Albumin/Globulin Ratio: 1.6 (ref 1.2–2.2)
Albumin: 4.7 g/dL (ref 3.8–4.8)
Alkaline Phosphatase: 39 IU/L — ABNORMAL LOW (ref 44–121)
BUN/Creatinine Ratio: 15 (ref 12–28)
BUN: 12 mg/dL (ref 8–27)
Bilirubin Total: 0.6 mg/dL (ref 0.0–1.2)
CO2: 22 mmol/L (ref 20–29)
Calcium: 9.6 mg/dL (ref 8.7–10.3)
Chloride: 99 mmol/L (ref 96–106)
Creatinine, Ser: 0.82 mg/dL (ref 0.57–1.00)
Globulin, Total: 2.9 g/dL (ref 1.5–4.5)
Glucose: 98 mg/dL (ref 65–99)
Potassium: 3.8 mmol/L (ref 3.5–5.2)
Sodium: 138 mmol/L (ref 134–144)
Total Protein: 7.6 g/dL (ref 6.0–8.5)
eGFR: 77 mL/min/{1.73_m2} (ref >59)

## 2021-02-19 LAB — THYROID PANEL WITH TSH
Free Thyroxine Index: 2.2 (ref 1.2–4.9)
T3 Uptake Ratio: 25 % (ref 24–39)
T4, Total: 8.8 ug/dL (ref 4.5–12.0)
TSH: 1.82 u[IU]/mL (ref 0.450–4.500)

## 2021-02-19 LAB — LIPID PANEL
Chol/HDL Ratio: 2.9 ratio (ref 0.0–4.4)
Cholesterol, Total: 178 mg/dL (ref 100–199)
HDL: 61 mg/dL (ref >39)
LDL Chol Calc (NIH): 97 mg/dL (ref 0–99)
Triglycerides: 110 mg/dL (ref 0–149)
VLDL Cholesterol Cal: 20 mg/dL (ref 5–40)

## 2021-02-19 LAB — HEMOGLOBIN A1C
Est. average glucose Bld gHb Est-mCnc: 108 mg/dL
Hgb A1c MFr Bld: 5.4 % (ref 4.8–5.6)

## 2021-02-19 LAB — VITAMIN D 25 HYDROXY (VIT D DEFICIENCY, FRACTURES): Vit D, 25-Hydroxy: 54.9 ng/mL (ref 30.0–100.0)

## 2021-02-19 LAB — INSULIN, RANDOM: INSULIN: 17.2 u[IU]/mL (ref 2.6–24.9)

## 2021-02-20 ENCOUNTER — Other Ambulatory Visit: Payer: Medicare Other

## 2021-02-20 ENCOUNTER — Other Ambulatory Visit: Payer: Self-pay

## 2021-02-20 ENCOUNTER — Ambulatory Visit: Payer: Medicare Other | Admitting: Hematology

## 2021-02-20 ENCOUNTER — Inpatient Hospital Stay: Payer: Medicare Other | Attending: Hematology

## 2021-02-20 DIAGNOSIS — C50211 Malignant neoplasm of upper-inner quadrant of right female breast: Secondary | ICD-10-CM | POA: Diagnosis not present

## 2021-02-20 DIAGNOSIS — C541 Malignant neoplasm of endometrium: Secondary | ICD-10-CM | POA: Insufficient documentation

## 2021-02-20 DIAGNOSIS — Z9221 Personal history of antineoplastic chemotherapy: Secondary | ICD-10-CM | POA: Insufficient documentation

## 2021-02-20 DIAGNOSIS — Z170 Estrogen receptor positive status [ER+]: Secondary | ICD-10-CM | POA: Diagnosis not present

## 2021-02-20 DIAGNOSIS — E8881 Metabolic syndrome: Secondary | ICD-10-CM | POA: Insufficient documentation

## 2021-02-20 DIAGNOSIS — Z95828 Presence of other vascular implants and grafts: Secondary | ICD-10-CM

## 2021-02-20 DIAGNOSIS — E7800 Pure hypercholesterolemia, unspecified: Secondary | ICD-10-CM | POA: Insufficient documentation

## 2021-02-20 DIAGNOSIS — G629 Polyneuropathy, unspecified: Secondary | ICD-10-CM | POA: Insufficient documentation

## 2021-02-20 DIAGNOSIS — I10 Essential (primary) hypertension: Secondary | ICD-10-CM | POA: Insufficient documentation

## 2021-02-20 DIAGNOSIS — M810 Age-related osteoporosis without current pathological fracture: Secondary | ICD-10-CM

## 2021-02-20 DIAGNOSIS — Z923 Personal history of irradiation: Secondary | ICD-10-CM | POA: Diagnosis not present

## 2021-02-20 DIAGNOSIS — M8580 Other specified disorders of bone density and structure, unspecified site: Secondary | ICD-10-CM | POA: Insufficient documentation

## 2021-02-20 DIAGNOSIS — K219 Gastro-esophageal reflux disease without esophagitis: Secondary | ICD-10-CM | POA: Insufficient documentation

## 2021-02-20 LAB — CBC WITH DIFFERENTIAL (CANCER CENTER ONLY)
Abs Immature Granulocytes: 0.01 10*3/uL (ref 0.00–0.07)
Basophils Absolute: 0 10*3/uL (ref 0.0–0.1)
Basophils Relative: 1 %
Eosinophils Absolute: 0.1 10*3/uL (ref 0.0–0.5)
Eosinophils Relative: 2 %
HCT: 38.9 % (ref 36.0–46.0)
Hemoglobin: 14 g/dL (ref 12.0–15.0)
Immature Granulocytes: 0 %
Lymphocytes Relative: 41 %
Lymphs Abs: 1.7 10*3/uL (ref 0.7–4.0)
MCH: 34.3 pg — ABNORMAL HIGH (ref 26.0–34.0)
MCHC: 36 g/dL (ref 30.0–36.0)
MCV: 95.3 fL (ref 80.0–100.0)
Monocytes Absolute: 0.5 10*3/uL (ref 0.1–1.0)
Monocytes Relative: 12 %
Neutro Abs: 1.9 10*3/uL (ref 1.7–7.7)
Neutrophils Relative %: 44 %
Platelet Count: 164 10*3/uL (ref 150–400)
RBC: 4.08 MIL/uL (ref 3.87–5.11)
RDW: 12 % (ref 11.5–15.5)
WBC Count: 4.2 10*3/uL (ref 4.0–10.5)
nRBC: 0 % (ref 0.0–0.2)

## 2021-02-20 MED ORDER — HEPARIN SOD (PORK) LOCK FLUSH 100 UNIT/ML IV SOLN
500.0000 [IU] | Freq: Once | INTRAVENOUS | Status: AC | PRN
  Administered 2021-02-20: 500 [IU] via INTRAVENOUS
  Filled 2021-02-20: qty 5

## 2021-02-20 MED ORDER — SODIUM CHLORIDE 0.9% FLUSH
10.0000 mL | INTRAVENOUS | Status: DC | PRN
Start: 2021-02-20 — End: 2021-02-20
  Administered 2021-02-20: 10 mL via INTRAVENOUS
  Filled 2021-02-20: qty 10

## 2021-02-20 NOTE — Progress Notes (Signed)
Chief Complaint:   OBESITY Shelvie is here to discuss her progress with her obesity treatment plan along with follow-up of her obesity related diagnoses. Kemaya is on the Category 2 Plan and states she is following her eating plan approximately 80% of the time. Nataya states she is walking for 2.5-3 miles 7 times per week.   Today's visit was #: 20 Starting weight: 190 lbs Starting date: 10/03/2019 Today's weight: 172 lbs Today's date: 02/18/2021 Total lbs lost to date: 18 Total lbs lost since last in-office visit: 2  Interim History: Tasheba did well with weight loss. She has decreased the sugar in her sweet tea. She is eating on the plan well.  Subjective:   1. Other hyperlipidemia Brunilda is on Lipitor, and she is taking it as prescribed.  2. Pre-diabetes Myishia is not on medications. Last A1c was 5.4. She is exercising regularly.  3. Vitamin D deficiency Edlin is on Vit D OTC, and she is tolerating it well.  4. Hypothyroidism, unspecified type Massiah is on Synthroid, and she denies excessive fatigue.  Assessment/Plan:   1. Other hyperlipidemia Cardiovascular risk and specific lipid/LDL goals reviewed. We discussed several lifestyle modifications today. We will check labs today. Marcele will continue to work on diet, exercise and weight loss efforts. Orders and follow up as documented in patient record.   Counseling Intensive lifestyle modifications are the first line treatment for this issue. Dietary changes: Increase soluble fiber. Decrease simple carbohydrates. Exercise changes: Moderate to vigorous-intensity aerobic activity 150 minutes per week if tolerated. Lipid-lowering medications: see documented in medical record.  - Lipid panel  2. Pre-diabetes Soniyah will continue her meal plan, exercise, and decreasing simple carbohydrates to help decrease the risk of diabetes. We will check labs today.  - Comprehensive metabolic panel - Hemoglobin A1c - Insulin,  random  3. Vitamin D deficiency Low Vitamin D level contributes to fatigue and are associated with obesity, breast, and colon cancer. We will check labs today. Kalimah will continue taking OTC Vitamin D and will follow-up for routine testing of Vitamin D, at least 2-3 times per year to avoid over-replacement.  - VITAMIN D 25 Hydroxy (Vit-D Deficiency, Fractures)  4. Hypothyroidism, unspecified type We will check labs today. Meredyth will continue to follow up as directed. Orders and follow up as documented in patient record.  Counseling Good thyroid control is important for overall health. Supratherapeutic thyroid levels are dangerous and will not improve weight loss results. Counseling: The correct way to take levothyroxine is fasting, with water, separated by at least 30 minutes from breakfast, and separated by more than 4 hours from calcium, iron, multivitamins, acid reflux medications (PPIs).   - Thyroid Panel With TSH  5. Class 1 obesity with serious comorbidity and body mass index (BMI) of 33.0 to 33.9 in adult, unspecified obesity type Allegra is currently in the action stage of change. As such, her goal is to continue with weight loss efforts. She has agreed to the Category 2 Plan.   Exercise goals: As is.  Behavioral modification strategies: meal planning and cooking strategies and keeping healthy foods in the home.  Tessi has agreed to follow-up with our clinic in 3 weeks. She was informed of the importance of frequent follow-up visits to maximize her success with intensive lifestyle modifications for her multiple health conditions.   Ryiah was informed we would discuss her lab results at her next visit unless there is a critical issue that needs to be addressed sooner. [name]  agreed to keep her next visit at the agreed upon time to discuss these results.  Objective:   Blood pressure 117/75, pulse 69, temperature 97.9 F (36.6 C), height 5\' 3"  (1.6 m), weight 172 lb (78 kg),  SpO2 96 %. Body mass index is 30.47 kg/m.  General: Cooperative, alert, well developed, in no acute distress. HEENT: Conjunctivae and lids unremarkable. Cardiovascular: Regular rhythm.  Lungs: Normal work of breathing. Neurologic: No focal deficits.   Lab Results  Component Value Date   CREATININE 0.82 02/18/2021   BUN 12 02/18/2021   NA 138 02/18/2021   K 3.8 02/18/2021   CL 99 02/18/2021   CO2 22 02/18/2021   Lab Results  Component Value Date   ALT 36 (H) 02/18/2021   AST 23 02/18/2021   ALKPHOS 39 (L) 02/18/2021   BILITOT 0.6 02/18/2021   Lab Results  Component Value Date   HGBA1C 5.4 02/18/2021   HGBA1C 5.4 10/10/2020   HGBA1C 5.5 04/30/2020   HGBA1C 5.4 10/03/2019   HGBA1C 5.4 02/09/2019   Lab Results  Component Value Date   INSULIN 17.2 02/18/2021   INSULIN 18.1 10/10/2020   INSULIN 26.4 (H) 04/30/2020   INSULIN 24.0 10/03/2019   Lab Results  Component Value Date   TSH 1.820 02/18/2021   Lab Results  Component Value Date   CHOL 178 02/18/2021   HDL 61 02/18/2021   LDLCALC 97 02/18/2021   TRIG 110 02/18/2021   CHOLHDL 2.9 02/18/2021   Lab Results  Component Value Date   WBC 4.2 02/20/2021   HGB 14.0 02/20/2021   HCT 38.9 02/20/2021   MCV 95.3 02/20/2021   PLT 164 02/20/2021   Lab Results  Component Value Date   FERRITIN 176 12/17/2012    Obesity Behavioral Intervention:   Approximately 15 minutes were spent on the discussion below.  ASK: We discussed the diagnosis of obesity with [name] today and Carrine agreed to give [name] permission to discuss obesity behavioral modification therapy today.  ASSESS: Monalisa has the diagnosis of obesity and her BMI today is 30.48. Kristl is in the action stage of change.   ADVISE: Fonnie was educated on the multiple health risks of obesity as well as the benefit of weight loss to improve her health. She was advised of the need for long term treatment and the importance of lifestyle modifications to improve  her current health and to decrease her risk of future health problems.  AGREE: Multiple dietary modification options and treatment options were discussed and Lark agreed to follow the recommendations documented in the above note.  ARRANGE: Mareta was educated on the importance of frequent visits to treat obesity as outlined per CMS and USPSTF guidelines and agreed to schedule her next follow up appointment today.  Attestation Statements:   Reviewed by clinician on day of visit: allergies, medications, problem list, medical history, surgical history, family history, social history, and previous encounter notes.   [name], am acting as transcriptionist for [employer], PA-C.  I have reviewed the above documentation for accuracy and completeness, and I agree with the above. [name], PA-C

## 2021-02-20 NOTE — Progress Notes (Addendum)
[place]   Telephone:(336) [phone] Fax:(336) [phone]   Clinic Follow up Note   Patient Care Team: [name], MD as PCP - Haskell Riling, MD (Inactive) as Consulting Physician (General Surgery) Truitt Merle, MD as Consulting Physician (Hematology) [name], MD as Attending Physician (Radiation Oncology)  Date of Service:  02/25/2021  CHIEF COMPLAINT: F/u on breast and endometrial cancers  SUMMARY OF ONCOLOGIC HISTORY: Oncology History Overview Note  MSI high Cancer Staging Endometrial cancer [facility]) Staging form: Corpus Uteri - Adenosarcoma, AJCC 8th Edition - Pathologic stage from 06/09/2017: Stage IIIC (pT1a, pN1, cM0) - Signed by Truitt Merle, MD on 06/16/2017  Malignant neoplasm of upper-inner quadrant of right breast in female, estrogen receptor positive ([place]) Staging form: Breast, AJCC 8th Edition - Clinical stage from 05/06/2017: Stage IA (cT1c, cN0, cM0, G2, ER: Positive, PR: Negative, HER2: Positive) - Unsigned - Pathologic stage from 05/25/2017: Stage IIA (pT2, pN0, cM0, G2, ER: Positive, PR: Negative, HER2: Negative) - Signed by Truitt Merle, MD on 06/16/2017     Malignant neoplasm of upper-inner quadrant of right breast in female, estrogen receptor positive ([place])  04/27/2017 Initial Biopsy   Diagnosis Breast, right, needle core biopsy INVASIVE DUCTAL CARCINOMA, GRADE 2 Microscopic Comment The neoplasm has intracellular mucin and signet ring cell features. Immunostains shows these cells are positive for ER,GATA3, ck7 and GCDFP, negative for ck20, cdx2, TTF-1 and pax8, The immunostaining pattern supports the neoplasm is breast primary.    04/27/2017 Receptors her2   Estrogen Receptor: 70%, POSITIVE, MODERATE STAINING INTENSITY Progesterone Receptor: 0%, NEGATIVE Proliferation Marker Ki67: 30% HER2 - **POSITIVE** RATIO OF HER2/CEP17 SIGNALS 2.91 AVERAGE HER2 COPY NUMBER PER CELL 7.28    04/27/2017 Initial Diagnosis   Malignant  neoplasm of upper-inner quadrant of right breast in female, estrogen receptor positive ([place])    05/07/2017 Imaging   CT Chest W Contrast 05/07/17 IMPRESSION: Tiny well-defined fatty lesion on the pleura at the right lung base. The thinner slice collimation used for today's chest CT eliminates volume-averaging seen in the lesion on the prior exam and confirms that this is a diffusely fatty nodule. This is a benign finding and likely represents a tiny lipoma. No defect in the hemidiaphragm evident to suggest tiny diaphragmatic hernia. Pulmonary hamartoma a consideration although the lack of soft tissue components makes this less likely.    05/15/2017 Genetic Testing   Patient had genetic testing due to a personal history of breast cancer and uterine cancer as well as a family history of cancer. The Multi-Cancer panel was ordered. The Multi-Cancer Panel offered by [facility] includes sequencing and/or deletion duplication testing of the following 83 genes: ALK, APC, ATM, AXIN2,BAP1,  BARD1, BLM, BMPR1A, BRCA1, BRCA2, BRIP1, CASR, CDC73, CDH1, CDK4, CDKN1B, CDKN1C, CDKN2A (p14ARF), CDKN2A (p16INK4a), CEBPA, CHEK2, CTNNA1, DICER1, DIS3L2, EGFR (c.2369C>T, p.Thr790Met variant only), EPCAM (Deletion/duplication testing only), FH, FLCN, GATA2, GPC3, GREM1 (Promoter region deletion/duplication testing only), HOXB13 (c.251G>A, p.Gly84Glu), HRAS, KIT, MAX, MEN1, MET, MITF (c.952G>A, p.Glu318Lys variant only), MLH1, MSH2, MSH3, MSH6, MUTYH, NBN, NF1, NF2, NTHL1, PALB2, PDGFRA, PHOX2B, PMS2, POLD1, POLE, POT1, PRKAR1A, PTCH1, PTEN, RAD50, RAD51C, RAD51D, RB1, RECQL4, RET, RUNX1, SDHAF2, SDHA (sequence changes only), SDHB, SDHC, SDHD, SMAD4, SMARCA4, SMARCB1, SMARCE1, STK11, SUFU, TERC, TERT, TMEM127, TP53, TSC1, TSC2, VHL, WRN and WT1.    Results: No pathogenic mutations were identified. A VUS in the GATA2 gene c.1348G>A (p.Gly450Arg) was identified.  The date of this test report is 05/25/2017.     05/25/2017  Surgery  RIGHT BREAST LUMPECTOMY WITH RADIOACTIVE SEED AND  RIGHT AXILLARY SENTINEL LYMPH NODE BIOPSY and Pot placement by Dr. Excell Seltzer 05/25/17    05/25/2017 Pathology Results   Diagnosis 05/25/17 1. Breast, lumpectomy, right - INVASIVE DUCTAL CARCINOMA, GRADE II/III, SPANNING 2.2 CM. - DUCTAL CARCINOMA IN SITU, HIGH GRADE. - THE SURGICAL RESECTION MARGINS ARE NEGATIVE FOR CARCINOMA. - SEE ONCOLOGY TABLE BELOW. 2. Breast, excision, right additional superior margin - BENIGN FIBROADIPOSE TISSUE. - BENIGN SKELETAL MUSCLE. - SEE COMMENT. 3. Breast, excision, right additional lateral margin - BENIGN BREAST PARENCHYMA. - THERE IS NO EVIDENCE OF MALIGNANCY. - SEE COMMENT. 4. Breast, excision, chest wall margin - BENIGN FIBROADIPOSE TISSUE. - THERE IS NO EVIDENCE OF MALIGNANCY. - SEE COMMENT. 5. Lymph node, sentinel, biopsy, right axillary - THERE IS NO EVIDENCE OF CARCINOMA IN 1 OF 1 LYMPH NODE (0/1). 6. Lymph node, sentinel, biopsy, right axillary - THERE IS NO EVIDENCE OF CARCINOMA IN 1 OF 1 LYMPH NODE (0/1). 7. Lymph node, sentinel, biopsy, right axillary - THERE IS NO EVIDENCE OF CARCINOMA IN 1 OF 1 LYMPH NODE (0/1).    06/26/2017 PET scan   PET  IMPRESSION: 1. Postoperative findings both in the right breast and in the anatomic pelvis, with associated low-grade activity considered to be postoperative in nature. No hypermetabolic adenopathy or hypermetabolic lesions are identified to suggest active metastatic disease/malignancy. 2. Other imaging findings of potential clinical significance: Aortic Atherosclerosis (ICD10-I70.0). Sigmoid colon diverticulosis. Biapical pleuroparenchymal scarring in the lungs. Small amount of free pelvic fluid in the cul-de-sac, likely postoperative.      07/01/2017 - 10/14/2017 Chemotherapy   Adjuvant TCH with Onpro every 3 weeks for 6 cycles starting on 07/01/17, Changed Taxol to abraxane with cycle 4 due to drug rash reaction, followed by  Herceptin every 3 weeks for 6 months     11/01/2017 - 12/08/2017 Radiation Therapy   Radiation therapy to her right breast with [name]    11/04/2017 - 06/2018 Chemotherapy   Maintenance Herceptin starting 11/04/17 and will comeplete her 12 months in 06/2018    12/23/2017 -  Anti-estrogen oral therapy   Adjuvant letrozole 2.5 mg daily started 12/23/17, switched to exemestane on 04/21/18 due to joint pain. Could not afford aromasin, started anastrozole 04/2018.     12/30/2017 Imaging   CT CAP W Contrast 12/30/17 IMPRESSION: Increased size of 1.1 cm aorto-caval retroperitoneal lymph node. This could be reactive due to interval hysterectomy, however metastatic carcinoma cannot be excluded. Consider continued attention on short-term follow-up CT, or PET-CT scan for further evaluation.   Mild hepatic steatosis.    01/25/2018 PET scan   IMPRESSION: 1. Enlarging hypermetabolic aortocaval lymph node is worrisome for metastatic disease. 2. Probable postoperative seroma in the medial right breast, with associated mild inflammatory hypermetabolism.    04/28/2018 Mammogram   Probably benign. Calcifications in the right breast most likely are dystrophic, related to previous surgery, and are probably benign.     04/28/2018 Imaging   DEXA Scan T Score -2.0    06/10/2018 Echocardiogram   06/10/2018 ECHO LV EF: 55% -   60%    08/28/2018 - 08/2018 Chemotherapy   Neratinib 86m for 1 week then titrate up with 1 additional tablet weekly up to 12 mg starting 08/28/18. Stopped soon after starting due to diarrhea.   10/25/2018 Imaging   CT CAP 10/25/18  IMPRESSION: 1. No evidence of metastatic disease. 2.  Aortic atherosclerosis (ICD10-170.0).    06/15/2019 Pathology Results   Diagnosis Breast, right, needle core  biopsy, 1 o'clock, 7cmfn - DENSE FIBROSIS CONSISTENT WITH PRIOR PROCEDURAL CHANGES. - NO MALIGNANCY IDENTIFIED.   10/24/2019 Imaging   CT CAP W Contrast  IMPRESSION: 1. Stable exam. No  evidence of recurrent or metastatic carcinoma within the chest, abdomen, or pelvis. 2. Colonic diverticulosis, without radiographic evidence of diverticulitis.   Aortic Atherosclerosis (ICD10-I70.0).   12/05/2020 Imaging   MRI Breast  IMPRESSION: No suspicious areas of enhancement identified within the right or left breast to suggest malignancy.   Endometrial adenocarcinoma ([place])  05/07/2017 Initial Diagnosis   Endometrial adenocarcinoma ([place])    06/09/2017 Surgery   XI ROBOTIC ASSISTED TOTAL LAPOROSCOPIC HYSTERECTOMY WITH BILATERAL SALPINGO OOPHORECTOMY and SENTINEL NODE BIOPSY by Dr. Denman George 06/09/17    06/09/2017 Pathology Results   Diagnosis 06/09/17 1. Lymph node, sentinel, biopsy, right external iliac - METASTATIC ADENOCARCINOMA IN ONE LYMPH NODE (1/1). 2. Lymph node, sentinel, biopsy, left obturator - ONE BENIGN LYMPH NODE (0/1). 3. Lymph node, sentinel, biopsy, left external iliac - METASTATIC ADENOCARCINOMA IN ONE LYMPH NODE (1/1). 4. Uterus +/- tubes/ovaries, neoplastic ENDOMETRIUM: - ENDOMETRIAL ADENOCARCINOMA, 3.2 CM. - CARCINOMA INVADES INNER HALF OF MYOMETRIUM. 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 - CERVIX, BILATERAL FALLOPIAN TUBES AND BILATERAL OVARIES FREE OF TUMOR    08/18/2017 - 09/17/2017 Radiation Therapy   vaginal brachytherapy per 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    02/17/2018 - 02/08/2020 Antibody Plan   Keytruda every 3 weeks starting 02/17/18. Completed 2 years on 02/08/20   04/11/2019 Imaging   CT AP W Contrast  IMPRESSION: 1. No evidence of recurrent or metastatic carcinoma within the abdomen or pelvis. 2. Colonic diverticulosis. No radiographic evidence of diverticulitis.     04/23/2020 Imaging   CT AP W contrast  IMPRESSION: 1. Stable exam. Specifically, no findings to suggest recurrent or metastatic  disease. 2. Left colonic diverticulosis without diverticulitis. 3. Aortic Atherosclerosis (ICD10-I70.0).     10/22/2020 Imaging   CT CAP  IMPRESSION: 1. No evidence of metastatic disease in the chest, abdomen or pelvis. 2. Small hiatal hernia. 3. Mild sigmoid diverticulosis. 4. Aortic Atherosclerosis (ICD10-I70.0).        CURRENT THERAPY:  Letrozole 2.30m daily starting 12/23/17. Switched to Anastrozole in 04/2018 due to joint pain Zometa q654monthfor 2 years starting 10/24/20  INTERVAL HISTORY:  ViAFTIN LYEs here for a follow up. She was last seen by me 4 months ago. She presents to the clinic alone. She is doing well overall, neuropathy is stable, but still bothersome. She denies any new pain or discomfort. She has been working with weight management clinic for the past year, she has lost about 20lbs in total.  She tolerated first Zometa infusion well 4 months ago, had some muscle aches for a day, resolved.  No other side effects.  REVIEW OF SYSTEMS:   Constitutional: Denies fevers, chills or abnormal weight loss Eyes: Denies blurriness of vision Ears, nose, mouth, throat, and face: Denies mucositis or sore throat Respiratory: Denies cough, dyspnea or wheezes Cardiovascular: Denies palpitation, chest discomfort or lower extremity swelling Gastrointestinal:  Denies nausea, heartburn or change in bowel habits Skin: Denies abnormal skin rashes Lymphatics: Denies new lymphadenopathy or easy bruising Neurological:Denies numbness, tingling or new weaknesses Behavioral/Psych: Mood is stable, no new changes  All other systems were reviewed with the patient and are negative.  MEDICAL HISTORY:  Past Medical History:  Diagnosis Date   Anemia    as a child.   Arthritis    Atypical mole 06/04/2018   moderate top of right foot   Atypical mole 06/04/2018   left elbow mild   Atypical mole 06/04/2018   left upper back mild   Bilateral cataracts    Bilateral leg cramps    Cancer  ([place])    skin   Dyspnea    Endometrial cancer ([place])    Family history of bladder cancer    Family history of colon cancer in father    Fatty liver    Fuchs' corneal dystrophy    GERD (gastroesophageal reflux disease)    Hearing loss    Right side 30%   Heartburn    History of hiatal hernia    small size   History of radiation therapy 08/21/17, 08/28/17,09/01/17, 09/11/17, 09/17/17   Vaginal cuff brachytherapy.    History of radiation therapy 11/11/17- 12/08/17   Right Breast treated to 40.05 Gy with 15 fx of 2.67 Gy and a boost of 10 Gy with 5 fx.    Hyperlipidemia    Hypertension    Hypothyroidism    IBS (irritable bowel syndrome)    hx of   Malignant neoplasm of upper-inner quadrant of right female breast ([place]) 04/2017   NAFL (nonalcoholic fatty liver)    Obesity    Osteoarthritis    Osteopenia    PONV (postoperative nausea and vomiting)    Tuberculosis    tested positive, mother had when patient was child   Uterine cancer ([place]) 04/2017   endometrial cancer   Varices, gastric    Vertigo     SURGICAL HISTORY: Past Surgical History:  Procedure Laterality Date   BREAST BIOPSY Right 04/27/2017   BREAST LUMPECTOMY WITH RADIOACTIVE SEED AND SENTINEL LYMPH NODE BIOPSY Right 05/25/2017   Procedure: RIGHT BREAST LUMPECTOMY WITH RADIOACTIVE SEED AND  RIGHT AXILLARY SENTINEL LYMPH NODE BIOPSY;  Surgeon: Excell Seltzer, MD;  Location: [facility];  Service: General;  Laterality: Right;   CATARACT EXTRACTION, BILATERAL     COLONOSCOPY     HYSTEROSCOPY WITH D & C N/A 04/29/2017   Procedure: DILATATION AND CURETTAGE /HYSTEROSCOPY;  Surgeon: Linda Hedges, DO;  Location: [facility];  Service: Gynecology;  Laterality: N/A;   PORTACATH PLACEMENT Left 05/25/2017   Procedure: INSERTION PORT-A-CATH;  Surgeon: Excell Seltzer, MD;  Location: [facility];  Service: General;  Laterality: Left;   ROBOTIC ASSISTED TOTAL HYSTERECTOMY WITH BILATERAL SALPINGO OOPHERECTOMY  N/A 06/09/2017   Procedure: XI ROBOTIC ASSISTED TOTAL LAPOROSCOPIC HYSTERECTOMY WITH BILATERAL SALPINGO OOPHORECTOMY;  Surgeon: Everitt Amber, MD;  Location: [facility];  Service: Gynecology;  Laterality: N/A;   SENTINEL NODE BIOPSY N/A 06/09/2017   Procedure: SENTINEL NODE BIOPSY;  Surgeon: Everitt Amber, MD;  Location: [facility];  Service: Gynecology;  Laterality: N/A;    I have reviewed the social history and family history with the patient and they are unchanged from previous note.  ALLERGIES:  is allergic to ciprofloxacin, paclitaxel, crestor [rosuvastatin calcium], fosamax [alendronate sodium], livalo [pitavastatin], and penicillins.  MEDICATIONS:  Current Outpatient Medications  Medication Sig Dispense Refill   acetaminophen (TYLENOL) 500 MG tablet Take 500 mg by mouth every 6 (six) hours as needed for moderate pain.     Alpha-Lipoic Acid 600 MG CAPS Take 1 capsule (600 mg total) by mouth daily. 90 capsule 3   AMBULATORY NON FORMULARY MEDICATION Medication Name: Shingrix IM x 1.  Repeat in 2-6 months. 1  Units 0   anastrozole (ARIMIDEX) 1 MG tablet TAKE 1 TABLET BY MOUTH  DAILY 90 tablet 3   Ascorbic Acid (VITAMIN C) 1000 MG tablet Take 1,000 mg by mouth daily.     atorvastatin (LIPITOR) 10 MG tablet TAKE 1 TABLET BY MOUTH  DAILY 90 tablet 3   b complex vitamins capsule Take 1 capsule by mouth daily.     Cholecalciferol (VITAMIN D3) 75 MCG (3000 UT) TABS Take 1 capsule by mouth daily.     diphenhydrAMINE (BENADRYL) 25 MG tablet Take 25-50 mg by mouth every 6 (six) hours as needed for sleep.     fluticasone (FLONASE) 50 MCG/ACT nasal spray Place 2 sprays into both nostrils daily.     gabapentin (NEURONTIN) 100 MG capsule TAKE 1 CAPSULE BY MOUTH 3  TIMES DAILY 180 capsule 5   levothyroxine (SYNTHROID) 50 MCG tablet TAKE 1 TABLET BY MOUTH  DAILY 90 tablet 3   lidocaine-prilocaine (EMLA) cream APPLY TO AFFECTED AREA ONCE 30 g 2   Multiple Vitamin (MULTIVITAMIN) capsule Take 1 capsule by mouth daily.      olmesartan-hydrochlorothiazide (BENICAR HCT) 20-12.5 MG tablet TAKE 1 TABLET BY MOUTH  DAILY 90 tablet 3   pantoprazole (PROTONIX) 20 MG tablet TAKE 1 TABLET BY MOUTH  DAILY 90 tablet 3   potassium chloride SA (KLOR-CON) 20 MEQ tablet TAKE 1 TABLET BY MOUTH  DAILY 90 tablet 3   pyridOXINE (VITAMIN B-6) 50 MG tablet Take 1 tablet (50 mg total) by mouth daily. 90 tablet 3   sodium chloride (MURO 128) 5 % ophthalmic solution Place 2 drops into both eyes daily.     No current facility-administered medications for this visit.    PHYSICAL EXAMINATION: ECOG PERFORMANCE STATUS: 0 - Asymptomatic  Vitals:   02/25/21 0821  BP: 130/75  Pulse: 78  Resp: 18  Temp: (!) 97.5 F (36.4 C)  SpO2: 100%   Filed Weights   02/25/21 0821  Weight: 176 lb 8 oz (80.1 kg)    GENERAL:alert, no distress and comfortable SKIN: skin color, texture, turgor are normal, no rashes or significant lesions EYES: normal, Conjunctiva are pink and non-injected, sclera clear NECK: supple, thyroid normal size, non-tender, without nodularity LYMPH:  no palpable lymphadenopathy in the cervical, axillary  LUNGS: clear to auscultation and percussion with normal breathing effort HEART: regular rate & rhythm and no murmurs and no lower extremity edema ABDOMEN:abdomen soft, non-tender and normal bowel sounds Musculoskeletal:no cyanosis of digits and no clubbing  NEURO: alert & oriented x 3 with fluent speech, no focal motor/sensory deficits Breasts: Breast inspection showed them to be symmetrical with no nipple discharge. Palpation of the breasts and axilla revealed no obvious mass that I could appreciate.    LABORATORY DATA:  I have reviewed the data as listed CBC Latest Ref Rng & Units 02/20/2021 10/22/2020 07/25/2020  WBC 4.0 - 10.5 K/uL 4.2 4.7 4.1  Hemoglobin 12.0 - 15.0 g/dL 14.0 14.0 13.9  Hematocrit 36.0 - 46.0 % 38.9 39.8 38.7  Platelets 150 - 400 K/uL 164 156 152     CMP Latest Ref Rng & Units 02/18/2021  10/22/2020 07/25/2020  Glucose 65 - 99 mg/dL 98 107(H) 95  BUN 8 - 27 mg/dL _0 Creatinine 0.57 - 1.00 mg/dL 0.82 0.81 0.80  Sodium 134 - 144 mmol/L 138 137 138  Potassium 3.5 - 5.2 mmol/L 3.8 3.8 3.7  Chloride 96 - 106 mmol/L 99 101 101  CO2 20 - 29 mmol/L 22 27  30  Calcium 8.7 - 10.3 mg/dL 9.6 9.2 9.2  Total Protein 6.0 - 8.5 g/dL 7.6 7.5 7.3  Total Bilirubin 0.0 - 1.2 mg/dL 0.6 0.6 0.8  Alkaline Phos 44 - 121 IU/L 39(L) 42 38  AST 0 - 40 IU/L _0 ALT 0 - 32 IU/L 36(H) 35 31      RADIOGRAPHIC STUDIES: I have personally reviewed the radiological images as listed and agreed with the findings in the report. No results found.   ASSESSMENT & PLAN:  Dawn [name] is a 70 y.o. female with    1. 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 Treated with right breast lumpectomy, adjuvant chemo and radiation. She tried Nerlynx but could not tolerate due to diarrhea, and does not want try again. -She started anti-estrogen therapy with letrozole. Due to joint pain I switched her to Anastrozole in 04/2018. She is tolerating well with manageable joint stiffness. Plan to continue for 7 years due to high risk disease.  -She is clinically doing well. Labs reviewed, CBC and CMP from this week WNL except BG 107. There is no clinical concern for recurrence. -Last screening breast MRI in March 2022 was benign, she due for mammogram in August -Continue anastrozole  -f/u in 4 months.    2. Endometrial adenocarcinoma with lymph node metastasis, pT1apN1M0, FIGO stage IIIc, MSI-H, probable RP node recurrence in 12/2017  -Diagnosed in 04/2017. Treated with hysterectomy, BSO, adjuvant radiation, and chemo with Carbo/taxol. -She had retroperitoneal lymph node metastasis in April 2019.  Has completed 2 years of Keytruda in May 2021. She has recovered well.  -I personally reviewed and discussed her CT CAP  from 10/22/20 which shows No evidence of metastatic disease in the chest, abdomen or pelvis.  -Continue with surveillance. Repeat CT CAP in 10/2021.  -OK to remove her port, will contact [facility]. -f/u in 4 months     3. HTN, Acid reflux, High Cholesterol  -Well Controlled, Continue to f/u with PCP  -She was seen to have Gastritis on 11/22/19 upper endoscopy/colonoscopy. She is on PPI.     4. Osteopenia  -DEXA scan in 05/2018 revealed T score of -2.0. -She was on monthly boniva, prescribed by her PCP, since 02/2018. I switched her to Zometa q76month for 2 years starting 10/24/20. I reviewed side effects with her today.  -Continue Vitamin D and Calcium supplements. Will check her Vitamin D level every 6 months.   -She is due for DEXA in 2022   5. Metabolic syndrome, hyperglycemia, Weight gain  -She has no history of diabetes.  -She was recently found to have abnormal TSH levels. She has been seen by endocrinologist and has started levothyroxine on 01/04/19. Will monitor TSH level.  -Her weight was trending up. [facility] and has been able to lose some weight. Will continue with change in diet and exercise. Her gaol weight is 160 pounds.  -Continue to follow-up with weight management clinic    6. Arthritis, Neuropathy G2 -She has arthritis, mainly in her back. She will take Tylenol as needed.  -Managed by Sports Medicine Dr. TDianah Field-She was started on B6 and alphalogic acid supplement for her neuropathy. Stable.  -she will be screening for our [facility]CRU [place]-2102 neuropathy studay, she is interested and met our research nurse today      Plan  -Lab and Zometa infusion in 2  months -Lab and follow-up in 4 months -We will refer her back to [facility] for port removal -She will be screened for the neuropathy clinical trial   No problem-specific Assessment & Plan notes found for this encounter.   No orders of the defined types were placed in this  encounter.  All questions were answered. The patient knows to call the clinic with any problems, questions or concerns. No barriers to learning was detected. The total time spent in the appointment was 30 minutes.     Truitt Merle, MD 02/25/2021   I, Joslyn Devon, am acting as scribe for Truitt Merle, MD.   I have reviewed the above documentation for accuracy and completeness, and I agree with the above.

## 2021-02-21 ENCOUNTER — Other Ambulatory Visit: Payer: Medicare Other

## 2021-02-21 ENCOUNTER — Ambulatory Visit: Payer: Medicare Other | Admitting: Hematology

## 2021-02-25 ENCOUNTER — Inpatient Hospital Stay (HOSPITAL_BASED_OUTPATIENT_CLINIC_OR_DEPARTMENT_OTHER): Payer: Medicare Other | Admitting: Hematology

## 2021-02-25 ENCOUNTER — Encounter: Payer: Self-pay | Admitting: Hematology

## 2021-02-25 ENCOUNTER — Other Ambulatory Visit: Payer: Self-pay

## 2021-02-25 ENCOUNTER — Inpatient Hospital Stay: Payer: Medicare Other | Admitting: Emergency Medicine

## 2021-02-25 VITALS — BP 130/75 | HR 78 | Temp 97.5°F | Resp 18 | Ht 63.0 in | Wt 176.5 lb

## 2021-02-25 DIAGNOSIS — C50211 Malignant neoplasm of upper-inner quadrant of right female breast: Secondary | ICD-10-CM

## 2021-02-25 DIAGNOSIS — Z170 Estrogen receptor positive status [ER+]: Secondary | ICD-10-CM | POA: Diagnosis not present

## 2021-02-25 DIAGNOSIS — M8580 Other specified disorders of bone density and structure, unspecified site: Secondary | ICD-10-CM | POA: Diagnosis not present

## 2021-02-25 DIAGNOSIS — G629 Polyneuropathy, unspecified: Secondary | ICD-10-CM | POA: Diagnosis not present

## 2021-02-25 DIAGNOSIS — C541 Malignant neoplasm of endometrium: Secondary | ICD-10-CM | POA: Diagnosis not present

## 2021-02-25 DIAGNOSIS — I10 Essential (primary) hypertension: Secondary | ICD-10-CM | POA: Diagnosis not present

## 2021-02-25 NOTE — Research (Signed)
Trial:  [id number] - TREATMENT OF ESTABLISHED CHEMOTHERAPY-INDUCED NEUROPATHY WITH N-PALMITOYLETHANOLAMIDE, A CANNABIMIMETIC NUTRACEUTICAL: A RANDOMIZED DOUBLE-BLIND PHASE II PILOT TRIAL Patient Dawn [name] was identified by this Clinical Research RN as a potential candidate for the above listed study.  This Clinical Research Nurse met with VAYLA WILHELMI, [id number], on 02/25/21 in a manner and location that ensures patient privacy to discuss participation in the above listed research study.  Patient is Unaccompanied.  A copy of the informed consent document with embedded HIPAA language was provided to the patient.  Patient reads, speaks, and understands [place]. Per pt today her peripheral neuropathy score is 5/10 in severity (verbal confirmation). Patient was provided with the business card of this Nurse and encouraged to contact the research team with any questions.  Approximately 15 minutes were spent with the patient reviewing the informed consent documents.  Patient was provided the option of taking informed consent documents home to review and was encouraged to review at their convenience with their support network, including other care providers. Patient took the consent documents home to review.  Will call pt in 1 week to f/u on potential interest.  Wells Guiles 'Learta CoddingFaith Rogue, RN, BSN Clinical Research Nurse I 02/25/21 9:41 AM

## 2021-03-04 ENCOUNTER — Telehealth: Payer: Self-pay | Admitting: Emergency Medicine

## 2021-03-04 NOTE — Telephone Encounter (Signed)
ACCRU-[id number] - TREATMENT OF ESTABLISHED CHEMOTHERAPY-INDUCED NEUROPATHY WITH N-PALMITOYLETHANOLAMIDE, A CANNABIMIMETIC NUTRACEUTICAL: A RANDOMIZED DOUBLE-BLIND PHASE II PILOT TRIAL  Called pt to f/u on potential interest in [facility]CRU study.  Pt declined to participate in study at this time.  Pt takes levothyroxine every morning and cannot take anything else before/after it, and is concerned about potential of receiving the 'twice daily' dosing of the study drug/placebo.  Pt aware to contact Research as needed with any further questions/concerns.  Wells Guiles '[place], RN, BSN Clinical Research Nurse I 03/04/21 9:44 AM

## 2021-03-13 ENCOUNTER — Ambulatory Visit (INDEPENDENT_AMBULATORY_CARE_PROVIDER_SITE_OTHER): Payer: Medicare Other | Admitting: Physician Assistant

## 2021-03-13 ENCOUNTER — Other Ambulatory Visit: Payer: Self-pay

## 2021-03-13 ENCOUNTER — Encounter (INDEPENDENT_AMBULATORY_CARE_PROVIDER_SITE_OTHER): Payer: Self-pay | Admitting: Physician Assistant

## 2021-03-13 VITALS — BP 108/55 | HR 75 | Temp 97.8°F | Ht 63.0 in | Wt 172.0 lb

## 2021-03-13 DIAGNOSIS — E559 Vitamin D deficiency, unspecified: Secondary | ICD-10-CM

## 2021-03-13 DIAGNOSIS — E7849 Other hyperlipidemia: Secondary | ICD-10-CM | POA: Diagnosis not present

## 2021-03-13 DIAGNOSIS — Z6833 Body mass index (BMI) 33.0-33.9, adult: Secondary | ICD-10-CM

## 2021-03-13 DIAGNOSIS — E669 Obesity, unspecified: Secondary | ICD-10-CM

## 2021-03-14 NOTE — Progress Notes (Signed)
Chief Complaint:   OBESITY Dawn [name] is here to discuss her progress with her obesity treatment plan along with follow-up of her obesity related diagnoses. Dawn [name] is on the Category 2 Plan and states she is following her eating plan approximately 80% of the time. Dawn [name] states she is walking 3 miles 6 times per week, and bore program for 15 minutes 1 time per week.  Today's visit was #: 21 Starting weight: 190 lbs Starting date: 10/03/2019 Today's weight: 172 lbs Today's date: 03/13/2021 Total lbs lost to date: 18 Total lbs lost since last in-office visit: 0  Interim History: Brittinee continues to be hungry after lunch around 3-4 pm. She is eating all of her food on her Category 2, but she is still drinking sweet tea.  Subjective:   1. Other hyperlipidemia Dawn [name] is on atorvastatin, and her lipid panel has much improved. She complains of myalgias with atorvastatin. I discussed labs with the patient today.  2. Vitamin D deficiency Dawn [name] is on OTC Vit D daily.  Assessment/Plan:   1. Other hyperlipidemia Cardiovascular risk and specific lipid/LDL goals reviewed. We discussed several lifestyle modifications today. Dawn [name] will speak with her primary care physician in regards to cholesterol medications and decreasing frequency. She will continue to work on diet, exercise and weight loss efforts. Orders and follow up as documented in patient record.   Counseling Intensive lifestyle modifications are the first line treatment for this issue. Dietary changes: Increase soluble fiber. Decrease simple carbohydrates. Exercise changes: Moderate to vigorous-intensity aerobic activity 150 minutes per week if tolerated. Lipid-lowering medications: see documented in medical record.  2. Vitamin D deficiency Low Vitamin D level contributes to fatigue and are associated with obesity, breast, and colon cancer. Dawn [name] will continue Vitamin D and will follow-up for routine testing of Vitamin D, at least  2-3 times per year to avoid over-replacement.  3. Class 1 obesity with serious comorbidity and body mass index (BMI) of 33.0 to 33.9 in adult, unspecified obesity type Dawn [name] is currently in the action stage of change. As such, her goal is to continue with weight loss efforts. She has agreed to the Category 2 Plan.   Add 1-2 oz of protein at lunch and Dawn [name] yogurt at 4 pm.  Exercise goals: As is.  Behavioral modification strategies: increasing lean protein intake and decreasing simple carbohydrates.  Dawn [name] has agreed to follow-up with our clinic in 2 weeks. She was informed of the importance of frequent follow-up visits to maximize her success with intensive lifestyle modifications for her multiple health conditions.   Objective:   Blood pressure (!) 108/55, pulse 75, temperature 97.8 F (36.6 C), height 5\' 3"  (1.6 m), weight 172 lb (78 kg), SpO2 98 %. Body mass index is 30.47 kg/m.  General: Cooperative, alert, well developed, in no acute distress. HEENT: Conjunctivae and lids unremarkable. Cardiovascular: Regular rhythm.  Lungs: Normal work of breathing. Neurologic: No focal deficits.   Lab Results  Component Value Date   CREATININE 0.82 02/18/2021   BUN 12 02/18/2021   NA 138 02/18/2021   K 3.8 02/18/2021   CL 99 02/18/2021   CO2 22 02/18/2021   Lab Results  Component Value Date   ALT 36 (H) 02/18/2021   AST 23 02/18/2021   ALKPHOS 39 (L) 02/18/2021   BILITOT 0.6 02/18/2021   Lab Results  Component Value Date   HGBA1C 5.4 02/18/2021   HGBA1C 5.4 10/10/2020   HGBA1C 5.5 04/30/2020   HGBA1C 5.4 10/03/2019   HGBA1C  5.4 02/09/2019   Lab Results  Component Value Date   INSULIN 17.2 02/18/2021   INSULIN 18.1 10/10/2020   INSULIN 26.4 (H) 04/30/2020   INSULIN 24.0 10/03/2019   Lab Results  Component Value Date   TSH 1.820 02/18/2021   Lab Results  Component Value Date   CHOL 178 02/18/2021   HDL 61 02/18/2021   LDLCALC 97 02/18/2021   TRIG 110 02/18/2021    CHOLHDL 2.9 02/18/2021   Lab Results  Component Value Date   VD25OH 54.9 02/18/2021   VD25OH 45.82 07/25/2020   VD25OH 45.2 04/30/2020   Lab Results  Component Value Date   WBC 4.2 02/20/2021   HGB 14.0 02/20/2021   HCT 38.9 02/20/2021   MCV 95.3 02/20/2021   PLT 164 02/20/2021   Lab Results  Component Value Date   FERRITIN 176 12/17/2012    Obesity Behavioral Intervention:   Approximately 15 minutes were spent on the discussion below.  ASK: We discussed the diagnosis of obesity with Dawn [name] today and Dawn [name] agreed to give [name] permission to discuss obesity behavioral modification therapy today.  ASSESS: Dawn [name] has the diagnosis of obesity and her BMI today is 30.48. Dawn [name] is in the action stage of change.   ADVISE: Angles was educated on the multiple health risks of obesity as well as the benefit of weight loss to improve her health. She was advised of the need for long term treatment and the importance of lifestyle modifications to improve her current health and to decrease her risk of future health problems.  AGREE: Multiple dietary modification options and treatment options were discussed and Dawn [name] agreed to follow the recommendations documented in the above note.  ARRANGE: Dawn [name] was educated on the importance of frequent visits to treat obesity as outlined per CMS and USPSTF guidelines and agreed to schedule her next follow up appointment today.  Attestation Statements:   Reviewed by clinician on day of visit: allergies, medications, problem list, medical history, surgical history, family history, social history, and previous encounter notes.   [name], am acting as transcriptionist for [employer], PA-C.  I have reviewed the above documentation for accuracy and completeness, and I agree with the above. [name], PA-C

## 2021-03-20 DIAGNOSIS — Z452 Encounter for adjustment and management of vascular access device: Secondary | ICD-10-CM | POA: Diagnosis not present

## 2021-03-27 ENCOUNTER — Ambulatory Visit (INDEPENDENT_AMBULATORY_CARE_PROVIDER_SITE_OTHER): Payer: Medicare Other | Admitting: Physician Assistant

## 2021-03-27 ENCOUNTER — Encounter (INDEPENDENT_AMBULATORY_CARE_PROVIDER_SITE_OTHER): Payer: Self-pay | Admitting: Physician Assistant

## 2021-03-27 ENCOUNTER — Other Ambulatory Visit: Payer: Self-pay

## 2021-03-27 VITALS — BP 125/67 | HR 80 | Temp 97.6°F | Ht 63.0 in | Wt 174.0 lb

## 2021-03-27 DIAGNOSIS — R7303 Prediabetes: Secondary | ICD-10-CM

## 2021-03-27 DIAGNOSIS — Z6833 Body mass index (BMI) 33.0-33.9, adult: Secondary | ICD-10-CM

## 2021-03-27 DIAGNOSIS — E669 Obesity, unspecified: Secondary | ICD-10-CM | POA: Diagnosis not present

## 2021-04-03 NOTE — Progress Notes (Signed)
Chief Complaint:   OBESITY Parisha is here to discuss her progress with her obesity treatment plan along with follow-up of her obesity related diagnoses. Charonda is on the Category 2 Plan and states she is following her eating plan approximately 80% of the time. Latoria states she is walking 3 miles 5 times per week.   Today's visit was #: 22 Starting weight: 190 lbs Starting date: 10/03/2019 Today's weight: 174 lbs Today's date: 03/27/2021 Total lbs lost to date: 16 Total lbs lost since last in-office visit: 0  Interim History: Lauryn states that she overindulged on July 4th. She is having trouble giving up her sweet tea. She thinks that she overeats her calories most days. Ozempic was discussed today for appetite control.  Subjective:   1. Pre-diabetes Marilouise is not on medications, and she reports polyphagia. Ozempic was discussed with the patient today, but she declines.  Assessment/Plan:   1. Pre-diabetes Tylynn will continue with weight loss, exercise, and decreasing simple carbohydrates to help decrease the risk of diabetes.   2. Class 1 obesity with serious comorbidity and body mass index (BMI) of 33.0 to 33.9 in adult, unspecified obesity type,  BMI 30.83 Sahmya is currently in the action stage of change. As such, her goal is to continue with weight loss efforts. She has agreed to the Category 2 Plan.   Debrah is to eat her dinner portion for lunch.  Exercise goals: As is.  Behavioral modification strategies: meal planning and cooking strategies and keeping healthy foods in the home.  Kalasia has agreed to follow-up with our clinic in 3 weeks. She was informed of the importance of frequent follow-up visits to maximize her success with intensive lifestyle modifications for her multiple health conditions.   Objective:   Blood pressure 125/67, pulse 80, temperature 97.6 F (36.4 C), height 5\' 3"  (1.6 m), weight 174 lb (78.9 kg), SpO2 98 %. Body mass index is 30.82  kg/m.  General: Cooperative, alert, well developed, in no acute distress. HEENT: Conjunctivae and lids unremarkable. Cardiovascular: Regular rhythm.  Lungs: Normal work of breathing. Neurologic: No focal deficits.   Lab Results  Component Value Date   CREATININE 0.82 02/18/2021   BUN 12 02/18/2021   NA 138 02/18/2021   K 3.8 02/18/2021   CL 99 02/18/2021   CO2 22 02/18/2021   Lab Results  Component Value Date   ALT 36 (H) 02/18/2021   AST 23 02/18/2021   ALKPHOS 39 (L) 02/18/2021   BILITOT 0.6 02/18/2021   Lab Results  Component Value Date   HGBA1C 5.4 02/18/2021   HGBA1C 5.4 10/10/2020   HGBA1C 5.5 04/30/2020   HGBA1C 5.4 10/03/2019   HGBA1C 5.4 02/09/2019   Lab Results  Component Value Date   INSULIN 17.2 02/18/2021   INSULIN 18.1 10/10/2020   INSULIN 26.4 (H) 04/30/2020   INSULIN 24.0 10/03/2019   Lab Results  Component Value Date   TSH 1.820 02/18/2021   Lab Results  Component Value Date   CHOL 178 02/18/2021   HDL 61 02/18/2021   LDLCALC 97 02/18/2021   TRIG 110 02/18/2021   CHOLHDL 2.9 02/18/2021   Lab Results  Component Value Date   VD25OH 54.9 02/18/2021   VD25OH 45.82 07/25/2020   VD25OH 45.2 04/30/2020   Lab Results  Component Value Date   WBC 4.2 02/20/2021   HGB 14.0 02/20/2021   HCT 38.9 02/20/2021   MCV 95.3 02/20/2021   PLT 164 02/20/2021   Lab Results  Component Value Date   FERRITIN 176 12/17/2012    Obesity Behavioral Intervention:   Approximately 15 minutes were spent on the discussion below.  ASK: We discussed the diagnosis of obesity with [name] today and Elizabeht agreed to give [name] permission to discuss obesity behavioral modification therapy today.  ASSESS: Hanley has the diagnosis of obesity and her BMI today is 30.83. Niajah is in the action stage of change.   ADVISE: Dorina was educated on the multiple health risks of obesity as well as the benefit of weight loss to improve her health. She was advised of the need  for long term treatment and the importance of lifestyle modifications to improve her current health and to decrease her risk of future health problems.  AGREE: Multiple dietary modification options and treatment options were discussed and Aditi agreed to follow the recommendations documented in the above note.  ARRANGE: Lateria was educated on the importance of frequent visits to treat obesity as outlined per CMS and USPSTF guidelines and agreed to schedule her next follow up appointment today.  Attestation Statements:   Reviewed by clinician on day of visit: allergies, medications, problem list, medical history, surgical history, family history, social history, and previous encounter notes.   [name], am acting as Location manager for [employer], PA-C.  I have reviewed the above documentation for accuracy and completeness, and I agree with the above. [name], PA-C

## 2021-04-08 ENCOUNTER — Ambulatory Visit: Payer: Self-pay | Admitting: Surgery

## 2021-04-08 ENCOUNTER — Other Ambulatory Visit: Payer: Self-pay

## 2021-04-08 ENCOUNTER — Encounter (HOSPITAL_BASED_OUTPATIENT_CLINIC_OR_DEPARTMENT_OTHER): Payer: Self-pay | Admitting: Surgery

## 2021-04-10 ENCOUNTER — Encounter (HOSPITAL_BASED_OUTPATIENT_CLINIC_OR_DEPARTMENT_OTHER)
Admission: RE | Admit: 2021-04-10 | Discharge: 2021-04-10 | Disposition: A | Discharge status: Home / Self Care | Payer: Medicare Other | Source: Ambulatory Visit | Attending: Surgery | Admitting: Surgery

## 2021-04-10 DIAGNOSIS — Z01818 Encounter for other preprocedural examination: Secondary | ICD-10-CM | POA: Insufficient documentation

## 2021-04-10 LAB — BASIC METABOLIC PANEL
Anion gap: 7 (ref 5–15)
BUN: 12 mg/dL (ref 8–23)
CO2: 27 mmol/L (ref 22–32)
Calcium: 9.5 mg/dL (ref 8.9–10.3)
Chloride: 101 mmol/L (ref 98–111)
Creatinine, Ser: 0.72 mg/dL (ref 0.44–1.00)
GFR, Estimated: >60 mL/min (ref >60)
Glucose, Bld: 105 mg/dL — ABNORMAL HIGH (ref 70–99)
Potassium: 3.8 mmol/L (ref 3.5–5.1)
Sodium: 135 mmol/L (ref 135–145)

## 2021-04-15 ENCOUNTER — Encounter (HOSPITAL_BASED_OUTPATIENT_CLINIC_OR_DEPARTMENT_OTHER): Payer: Self-pay | Admitting: Surgery

## 2021-04-16 ENCOUNTER — Encounter (HOSPITAL_BASED_OUTPATIENT_CLINIC_OR_DEPARTMENT_OTHER)
Admission: RE | Disposition: A | Discharge status: Home / Self Care | Payer: Self-pay | Source: Home / Self Care | Attending: Surgery

## 2021-04-16 ENCOUNTER — Ambulatory Visit (HOSPITAL_BASED_OUTPATIENT_CLINIC_OR_DEPARTMENT_OTHER)
Admission: RE | Admit: 2021-04-16 | Discharge: 2021-04-16 | Disposition: A | Discharge status: Home / Self Care | Payer: Medicare Other | Attending: Surgery | Admitting: Surgery

## 2021-04-16 ENCOUNTER — Encounter (HOSPITAL_BASED_OUTPATIENT_CLINIC_OR_DEPARTMENT_OTHER): Payer: Self-pay | Admitting: Surgery

## 2021-04-16 ENCOUNTER — Ambulatory Visit (HOSPITAL_BASED_OUTPATIENT_CLINIC_OR_DEPARTMENT_OTHER): Payer: Medicare Other | Admitting: Anesthesiology

## 2021-04-16 ENCOUNTER — Other Ambulatory Visit: Payer: Self-pay

## 2021-04-16 DIAGNOSIS — C50919 Malignant neoplasm of unspecified site of unspecified female breast: Secondary | ICD-10-CM | POA: Insufficient documentation

## 2021-04-16 DIAGNOSIS — Z79899 Other long term (current) drug therapy: Secondary | ICD-10-CM | POA: Insufficient documentation

## 2021-04-16 DIAGNOSIS — Z880 Allergy status to penicillin: Secondary | ICD-10-CM | POA: Diagnosis not present

## 2021-04-16 DIAGNOSIS — Z79811 Long term (current) use of aromatase inhibitors: Secondary | ICD-10-CM | POA: Insufficient documentation

## 2021-04-16 DIAGNOSIS — Z79890 Hormone replacement therapy (postmenopausal): Secondary | ICD-10-CM | POA: Insufficient documentation

## 2021-04-16 DIAGNOSIS — Z7983 Long term (current) use of bisphosphonates: Secondary | ICD-10-CM | POA: Diagnosis not present

## 2021-04-16 DIAGNOSIS — Z452 Encounter for adjustment and management of vascular access device: Secondary | ICD-10-CM | POA: Diagnosis not present

## 2021-04-16 DIAGNOSIS — Z888 Allergy status to other drugs, medicaments and biological substances status: Secondary | ICD-10-CM | POA: Insufficient documentation

## 2021-04-16 DIAGNOSIS — Z881 Allergy status to other antibiotic agents status: Secondary | ICD-10-CM | POA: Insufficient documentation

## 2021-04-16 DIAGNOSIS — I10 Essential (primary) hypertension: Secondary | ICD-10-CM | POA: Diagnosis not present

## 2021-04-16 DIAGNOSIS — Z8542 Personal history of malignant neoplasm of other parts of uterus: Secondary | ICD-10-CM | POA: Diagnosis not present

## 2021-04-16 DIAGNOSIS — K219 Gastro-esophageal reflux disease without esophagitis: Secondary | ICD-10-CM | POA: Diagnosis not present

## 2021-04-16 DIAGNOSIS — E039 Hypothyroidism, unspecified: Secondary | ICD-10-CM | POA: Diagnosis not present

## 2021-04-16 HISTORY — PX: PORT-A-CATH REMOVAL: SHX5289

## 2021-04-16 SURGERY — REMOVAL PORT-A-CATH
Anesthesia: Monitor Anesthesia Care | Site: Chest | Laterality: Left

## 2021-04-16 MED ORDER — MIDAZOLAM HCL 5 MG/5ML IJ SOLN
INTRAMUSCULAR | Status: DC | PRN
  Administered 2021-04-16: 1 mg via INTRAVENOUS

## 2021-04-16 MED ORDER — ACETAMINOPHEN 325 MG PO TABS: 325.0000 mg | ORAL_TABLET | ORAL | Status: DC | PRN

## 2021-04-16 MED ORDER — CHLORHEXIDINE GLUCONATE CLOTH 2 % EX PADS: 6.0000 | MEDICATED_PAD | Freq: Once | CUTANEOUS | Status: DC

## 2021-04-16 MED ORDER — PROPOFOL 10 MG/ML IV BOLUS
INTRAVENOUS | Status: DC | PRN
  Administered 2021-04-16 (×4): 20 mg via INTRAVENOUS
  Administered 2021-04-16: 10 mg via INTRAVENOUS
  Administered 2021-04-16 (×2): 20 mg via INTRAVENOUS
  Administered 2021-04-16: 10 mg via INTRAVENOUS

## 2021-04-16 MED ORDER — ONDANSETRON HCL 4 MG/2ML IJ SOLN: 4.0000 mg | Freq: Once | INTRAMUSCULAR | Status: DC | PRN

## 2021-04-16 MED ORDER — LIDOCAINE HCL (PF) 1 % IJ SOLN
INTRAMUSCULAR | Status: DC | PRN
  Administered 2021-04-16: 9 mL

## 2021-04-16 MED ORDER — ACETAMINOPHEN 500 MG PO TABS: 1000.0000 mg | ORAL_TABLET | ORAL | Status: DC

## 2021-04-16 MED ORDER — FENTANYL CITRATE (PF) 100 MCG/2ML IJ SOLN
INTRAMUSCULAR | Status: DC | PRN
  Administered 2021-04-16: 50 ug via INTRAVENOUS

## 2021-04-16 MED ORDER — ONDANSETRON HCL 4 MG/2ML IJ SOLN
INTRAMUSCULAR | Status: AC
  Filled 2021-04-16: qty 2

## 2021-04-16 MED ORDER — MEPERIDINE HCL 25 MG/ML IJ SOLN: 6.2500 mg | INTRAMUSCULAR | Status: DC | PRN

## 2021-04-16 MED ORDER — LIDOCAINE HCL (CARDIAC) PF 100 MG/5ML IV SOSY
PREFILLED_SYRINGE | INTRAVENOUS | Status: DC | PRN
  Administered 2021-04-16: 50 mg via INTRAVENOUS

## 2021-04-16 MED ORDER — OXYCODONE HCL 5 MG/5ML PO SOLN: 5.0000 mg | Freq: Once | ORAL | Status: DC | PRN

## 2021-04-16 MED ORDER — MIDAZOLAM HCL 2 MG/2ML IJ SOLN
INTRAMUSCULAR | Status: AC
  Filled 2021-04-16: qty 2

## 2021-04-16 MED ORDER — PROMETHAZINE HCL 25 MG/ML IJ SOLN: 6.2500 mg | INTRAMUSCULAR | Status: DC | PRN

## 2021-04-16 MED ORDER — ACETAMINOPHEN 500 MG PO TABS
ORAL_TABLET | ORAL | Status: AC
  Filled 2021-04-16: qty 2

## 2021-04-16 MED ORDER — LACTATED RINGERS IV SOLN: INTRAVENOUS | Status: DC

## 2021-04-16 MED ORDER — FENTANYL CITRATE (PF) 100 MCG/2ML IJ SOLN
INTRAMUSCULAR | Status: AC
  Filled 2021-04-16: qty 2

## 2021-04-16 MED ORDER — OXYCODONE HCL 5 MG PO TABS: 5.0000 mg | ORAL_TABLET | Freq: Once | ORAL | Status: DC | PRN

## 2021-04-16 MED ORDER — SODIUM BICARBONATE 4.2 % IV SOLN
INTRAVENOUS | Status: AC
  Filled 2021-04-16: qty 10

## 2021-04-16 MED ORDER — HYDROMORPHONE HCL 1 MG/ML IJ SOLN: 0.2500 mg | INTRAMUSCULAR | Status: DC | PRN

## 2021-04-16 MED ORDER — ACETAMINOPHEN 160 MG/5ML PO SOLN: 325.0000 mg | ORAL | Status: DC | PRN

## 2021-04-16 MED ORDER — PROPOFOL 10 MG/ML IV BOLUS
INTRAVENOUS | Status: AC
  Filled 2021-04-16: qty 20

## 2021-04-16 MED ORDER — LIDOCAINE HCL (PF) 1 % IJ SOLN
INTRAMUSCULAR | Status: AC
  Filled 2021-04-16: qty 5

## 2021-04-16 MED ORDER — FENTANYL CITRATE (PF) 100 MCG/2ML IJ SOLN: 25.0000 ug | INTRAMUSCULAR | Status: DC | PRN

## 2021-04-16 MED ORDER — ONDANSETRON HCL 4 MG/2ML IJ SOLN
INTRAMUSCULAR | Status: DC | PRN
  Administered 2021-04-16: 4 mg via INTRAVENOUS

## 2021-04-16 MED ORDER — LIDOCAINE HCL (PF) 1 % IJ SOLN
INTRAMUSCULAR | Status: AC
  Filled 2021-04-16: qty 30

## 2021-04-16 SURGICAL SUPPLY — 36 items
ADH SKN CLS APL DERMABOND .7 (GAUZE/BANDAGES/DRESSINGS) ×1
APL SKNCLS STERI-STRIP NONHPOA (GAUZE/BANDAGES/DRESSINGS)
BENZOIN TINCTURE PRP APPL 2/3 (GAUZE/BANDAGES/DRESSINGS) IMPLANT
BLADE SURG 15 STRL LF DISP TIS (BLADE) ×1 IMPLANT
BLADE SURG 15 STRL SS (BLADE) ×2
CANISTER SUCT 1200ML W/VALVE (MISCELLANEOUS) IMPLANT
CLEANER CAUTERY TIP 5X5 PAD (MISCELLANEOUS) IMPLANT
COVER BACK TABLE 60X90IN (DRAPES) ×2 IMPLANT
COVER MAYO STAND STRL (DRAPES) ×2 IMPLANT
DECANTER SPIKE VIAL GLASS SM (MISCELLANEOUS) ×2 IMPLANT
DERMABOND ADVANCED (GAUZE/BANDAGES/DRESSINGS) ×1
DERMABOND ADVANCED .7 DNX12 (GAUZE/BANDAGES/DRESSINGS) ×1 IMPLANT
DRAPE LAPAROTOMY 100X72 PEDS (DRAPES) ×2 IMPLANT
ELECT REM PT RETURN 9FT ADLT (ELECTROSURGICAL) ×2
ELECTRODE REM PT RTRN 9FT ADLT (ELECTROSURGICAL) ×1 IMPLANT
GAUZE SPONGE 4X4 12PLY STRL LF (GAUZE/BANDAGES/DRESSINGS) IMPLANT
GLOVE SURG ENC MOIS LTX SZ8 (GLOVE) ×2 IMPLANT
GOWN STRL REUS W/ TWL LRG LVL3 (GOWN DISPOSABLE) ×1 IMPLANT
GOWN STRL REUS W/ TWL XL LVL3 (GOWN DISPOSABLE) ×1 IMPLANT
GOWN STRL REUS W/TWL LRG LVL3 (GOWN DISPOSABLE) ×2
GOWN STRL REUS W/TWL XL LVL3 (GOWN DISPOSABLE) ×2
NEEDLE HYPO 25X1 1.5 SAFETY (NEEDLE) ×2 IMPLANT
PACK BASIN DAY SURGERY FS (CUSTOM PROCEDURE TRAY) ×2 IMPLANT
PAD CLEANER CAUTERY TIP 5X5 (MISCELLANEOUS)
PENCIL SMOKE EVACUATOR (MISCELLANEOUS) ×2 IMPLANT
SLEEVE SCD COMPRESS KNEE MED (STOCKING) IMPLANT
STRIP CLOSURE SKIN 1/2X4 (GAUZE/BANDAGES/DRESSINGS) IMPLANT
SUT VIC AB 4-0 SH 18 (SUTURE) ×2 IMPLANT
SUT VIC AB 5-0 P-3 18X BRD (SUTURE) ×1 IMPLANT
SUT VIC AB 5-0 P3 18 (SUTURE) ×2
SYR BULB EAR ULCER 3OZ GRN STR (SYRINGE) IMPLANT
SYR CONTROL 10ML LL (SYRINGE) ×2 IMPLANT
TOWEL GREEN STERILE FF (TOWEL DISPOSABLE) ×2 IMPLANT
TRAY DSU PREP LF (CUSTOM PROCEDURE TRAY) ×2 IMPLANT
TUBE CONNECTING 20X1/4 (TUBING) IMPLANT
YANKAUER SUCT BULB TIP NO VENT (SUCTIONS) IMPLANT

## 2021-04-16 NOTE — Transfer of Care (Signed)
Immediate Anesthesia Transfer of Care Note  Patient: Dawn [name]  Procedure(s) Performed: REMOVAL PORT-A-CATH (Left: Chest)  Patient Location: PACU  Anesthesia Type:MAC  Level of Consciousness: awake, alert  and oriented  Airway & Oxygen Therapy: Patient Spontanous Breathing  Post-op Assessment: Report given to RN and Post -op Vital signs reviewed and stable  Post vital signs: Reviewed and stable  Last Vitals:  Vitals Value Taken Time  BP 108/63 04/16/21 1427  Temp    Pulse 68 04/16/21 1429  Resp 15 04/16/21 1429  SpO2 93 % 04/16/21 1429  Vitals shown include unvalidated device data.  Last Pain:  Vitals:   04/16/21 1307  TempSrc: Oral  PainSc: 0-No pain         Complications: No notable events documented.

## 2021-04-16 NOTE — Discharge Instructions (Addendum)

## 2021-04-16 NOTE — H&P (Signed)
Chief Complaint: PAC removal   History of Present Illness: Dawn [name] is a 70 y.o. female who is seen today for left subclavian portacath removal. This was placed by Dr. Excell Seltzer in 2018 for breast and endometrial cancer. She received Herceptin and Nat Math and is now on aromatase inhibitor. .  Review of Systems: Per reason for visit in HPI  Medical History: Past Medical History:  Diagnosis Date   Anemia   Arthritis   GERD (gastroesophageal reflux disease)   History of cancer   Hyperlipidemia   Hypertension   There is no problem list on file for this patient.  Past Surgical History:  Procedure Laterality Date   HYSTERECTOMY   MASTECTOMY PARTIAL / LUMPECTOMY Right 05/25/2017    Allergies  Allergen Reactions   Ciprofloxacin Rash   Crestor [Rosuvastatin] Unknown   Penicillins Rash   Taxol [Paclitaxel] Rash   Current Outpatient Medications on File Prior to Visit  Medication Sig Dispense Refill   alendronate (FOSAMAX) 10 MG tablet Take 10 mg by mouth every morning before breakfast Take with a full glass of water. Do not lie down for the next 30 min.   anastrozole (ARIMIDEX) 1 mg tablet anastrozole 1 mg tabs   ascorbic acid, vitamin C, (VITAMIN C) 100 MG tablet Vitamin C   atorvastatin (LIPITOR) 10 MG tablet Take 1 tablet by mouth once daily   cholecalciferol, vitamin D3, 75 mcg (3,000 unit) Tab Take 1 capsule by mouth once daily   diphenhydrAMINE (BENADRYL) 25 mg tablet Take by mouth   fluticasone propionate (FLONASE) 50 mcg/actuation nasal spray Place into one nostril   gabapentin (NEURONTIN) 100 MG capsule Take 1 capsule by mouth 3 (three) times daily   levothyroxine (SYNTHROID) 50 MCG tablet   lidocaine-prilocaine (EMLA) cream lidocaine-prilocaine 2.5 %-2.5 % topical cream   multivitamin capsule Take 1 capsule by mouth once daily   olmesartan-hydrochlorothiazide (BENICAR HCT) 20-12.5 mg tablet olmesartan 20 mg-hydrochlorothiazide 12.5 mg tablet   pantoprazole  (PROTONIX) 20 MG DR tablet   potassium chloride (KLOR-CON) 20 MEQ ER tablet   pyridoxine, vitamin B6, (VITAMIN B-6) 50 MG tablet Take 50 mg by mouth once daily   vitamin B complex (B COMPLEX 1 ORAL) B Complex   No current facility-administered medications on file prior to visit.   Family History  Problem Relation Age of Onset   Obesity Mother   Stroke Mother   Colon cancer Father   Myocardial Infarction (Heart attack) Father   Hyperlipidemia (Elevated cholesterol) Father   High blood pressure (Hypertension) Father   Obesity Father    Social History   Tobacco Use  Smoking Status Never Smoker  Smokeless Tobacco Never Used    Social History   Socioeconomic History   Marital status: Married  Tobacco Use   Smoking status: Never Smoker   Smokeless tobacco: Never Used  Scientific laboratory technician Use: Never used  Substance and Sexual Activity   Alcohol use: Yes   Drug use: Never   Objective:   Vitals:  03/20/21 1027  BP: 124/80  Pulse: 54  Weight: 80.4 kg (177 lb 3.2 oz)  Height: 160 cm ('5\' 3"'$ )   Body mass index is 31.39 kg/m.  Physical Exam   Chest-port is located on the left side with small scar above.   Labs, Imaging and Diagnostic Testing:  none  Assessment and Plan:  Diagnoses and all orders for this visit:  Encounter for care related to Port-a-Cath - [facility] Case Posting Request; Future  Will schedule removal of left subclavian portacath  Return for postop follow up.  Ashten Prats Donia Pounds, MD

## 2021-04-16 NOTE — Interval H&P Note (Signed)
History and Physical Interval Note:  04/16/2021 1:41 PM  Dawn [name]  has presented today for surgery, with the diagnosis of PORT A CATH IN PLACE.  The various methods of treatment have been discussed with the patient and family. After consideration of risks, benefits and other options for treatment, the patient has consented to  Procedure(s): REMOVAL PORT-A-CATH (N/A) as a surgical intervention.  The patient's history has been reviewed, patient examined, no change in status, stable for surgery.  I have reviewed the patient's chart and labs.  Questions were answered to the patient's satisfaction.     [name]

## 2021-04-16 NOTE — Anesthesia Postprocedure Evaluation (Signed)
Anesthesia Post Note  Patient: Dawn [name]  Procedure(s) Performed: REMOVAL PORT-A-CATH (Left: Chest)     Patient location during evaluation: PACU Anesthesia Type: MAC Level of consciousness: awake and alert Pain management: pain level controlled Vital Signs Assessment: post-procedure vital signs reviewed and stable Respiratory status: spontaneous breathing, nonlabored ventilation and respiratory function stable Cardiovascular status: blood pressure returned to baseline and stable Postop Assessment: no apparent nausea or vomiting Anesthetic complications: no   No notable events documented.  Last Vitals:  Vitals:   04/16/21 1445 04/16/21 1503  BP:  133/66  Pulse: 63 68  Resp: 16 14  Temp:  36.5 C  SpO2: 93% 95%    Last Pain:  Vitals:   04/16/21 1503  TempSrc:   PainSc: 0-No pain                 Lynda Rainwater

## 2021-04-16 NOTE — Anesthesia Preprocedure Evaluation (Signed)
Anesthesia Evaluation  Patient identified by MRN, date of birth, ID band Patient awake    Reviewed: Allergy & Precautions, H&P , NPO status , Patient's Chart, lab work & pertinent test results  History of Anesthesia Complications (+) PONV  Airway Mallampati: II   Neck ROM: full    Dental  (+) Dental Advisory Given   Pulmonary shortness of breath,    breath sounds clear to auscultation       Cardiovascular hypertension, Pt. on medications  Rhythm:regular Rate:Normal  EKG - inferior and anteroseptal Q waves  Echo 04/2017 - Mild LVH. Estimated ejection fraction was in the range of 65% to 70%. Grade 1 diastolic  Dysfunction. No valvulopathy noted.    Neuro/Psych Vertigo negative psych ROS   GI/Hepatic Neg liver ROS, hiatal hernia, GERD  ,  Endo/Other  Hypothyroidism obesity  Renal/GU negative Renal ROS  negative genitourinary   Musculoskeletal negative musculoskeletal ROS (+) Arthritis ,   Abdominal (+) + obese,   Peds  Hematology negative hematology ROS (+)   Anesthesia Other Findings   Reproductive/Obstetrics Breast CA                             Anesthesia Physical  Anesthesia Plan  ASA: II  Anesthesia Plan: MAC   Post-op Pain Management:    Induction: Intravenous  PONV Risk Score and Plan: 3 and Ondansetron, Dexamethasone, Midazolam and Treatment may vary due to age or medical condition  Airway Management Planned: Simple Face Mask  Additional Equipment: None  Intra-op Plan:   Post-operative Plan:   Informed Consent: I have reviewed the patients History and Physical, chart, labs and discussed the procedure including the risks, benefits and alternatives for the proposed anesthesia with the patient or authorized representative who has indicated his/her understanding and acceptance.     Dental advisory given  Plan Discussed with: CRNA  Anesthesia Plan Comments:          Anesthesia Quick Evaluation

## 2021-04-16 NOTE — Interval H&P Note (Signed)
History and Physical Interval Note:  04/16/2021 1:42 PM  Dawn [name]  has presented today for surgery, with the diagnosis of PORT A CATH IN PLACE.  The various methods of treatment have been discussed with the patient and family. After consideration of risks, benefits and other options for treatment, the patient has consented to  Procedure(s): REMOVAL PORT-A-CATH (N/A) as a surgical intervention.  The patient's history has been reviewed, patient examined, no change in status, stable for surgery.  I have reviewed the patient's chart and labs.  Questions were answered to the patient's satisfaction.     [name]

## 2021-04-16 NOTE — Op Note (Signed)
Dawn [name]  1951/05/30   04/16/2021    PCP:  [name], MD   Surgeon: Kaylyn Lim, MD, FACS  Asst:  none  Anes:  Local with MAC  Preop Dx: History of breast cancer with left subclavian portacath Postop Dx: Same-port removed  Procedure: Explantation of portacath Location Surgery: CDS 5 Complications: none  EBL:   minimal cc  Drains: none  Description of Procedure:  The patient was taken to OR 5 .  After anesthesia was administered and the patient was prepped  with chloroprep  and a timeout was performed.  The area was infiltrated with lidocaine.  The scar that Dr. Excell Seltzer made was incised and the port identified and the two prolene sutures holding it in place were removed and the port was explanted in toto.  The wound was closed with 4-0 vicryl and Dermabond.    The patient tolerated the procedure well and was taken to the PACU in stable condition.     Matt B. Hassell Done, [place], [facility], [place]

## 2021-04-17 ENCOUNTER — Encounter (HOSPITAL_BASED_OUTPATIENT_CLINIC_OR_DEPARTMENT_OTHER): Payer: Self-pay | Admitting: Surgery

## 2021-04-18 ENCOUNTER — Encounter (INDEPENDENT_AMBULATORY_CARE_PROVIDER_SITE_OTHER): Payer: Self-pay

## 2021-04-22 ENCOUNTER — Ambulatory Visit (INDEPENDENT_AMBULATORY_CARE_PROVIDER_SITE_OTHER): Payer: Medicare Other | Admitting: Physician Assistant

## 2021-04-29 ENCOUNTER — Inpatient Hospital Stay: Payer: Medicare Other | Attending: Hematology

## 2021-04-29 ENCOUNTER — Other Ambulatory Visit: Payer: Medicare Other

## 2021-04-29 ENCOUNTER — Inpatient Hospital Stay: Payer: Medicare Other

## 2021-04-29 ENCOUNTER — Other Ambulatory Visit: Payer: Self-pay

## 2021-04-29 VITALS — BP 128/67 | HR 71 | Temp 98.2°F | Resp 16 | Wt 176.8 lb

## 2021-04-29 DIAGNOSIS — E8881 Metabolic syndrome: Secondary | ICD-10-CM | POA: Insufficient documentation

## 2021-04-29 DIAGNOSIS — Z923 Personal history of irradiation: Secondary | ICD-10-CM | POA: Diagnosis not present

## 2021-04-29 DIAGNOSIS — Z170 Estrogen receptor positive status [ER+]: Secondary | ICD-10-CM | POA: Diagnosis not present

## 2021-04-29 DIAGNOSIS — M8580 Other specified disorders of bone density and structure, unspecified site: Secondary | ICD-10-CM | POA: Diagnosis not present

## 2021-04-29 DIAGNOSIS — Z9221 Personal history of antineoplastic chemotherapy: Secondary | ICD-10-CM | POA: Insufficient documentation

## 2021-04-29 DIAGNOSIS — I10 Essential (primary) hypertension: Secondary | ICD-10-CM | POA: Diagnosis not present

## 2021-04-29 DIAGNOSIS — C50211 Malignant neoplasm of upper-inner quadrant of right female breast: Secondary | ICD-10-CM | POA: Insufficient documentation

## 2021-04-29 DIAGNOSIS — K219 Gastro-esophageal reflux disease without esophagitis: Secondary | ICD-10-CM | POA: Diagnosis not present

## 2021-04-29 DIAGNOSIS — C541 Malignant neoplasm of endometrium: Secondary | ICD-10-CM | POA: Insufficient documentation

## 2021-04-29 DIAGNOSIS — E7800 Pure hypercholesterolemia, unspecified: Secondary | ICD-10-CM | POA: Diagnosis not present

## 2021-04-29 DIAGNOSIS — G629 Polyneuropathy, unspecified: Secondary | ICD-10-CM | POA: Diagnosis not present

## 2021-04-29 DIAGNOSIS — Z95828 Presence of other vascular implants and grafts: Secondary | ICD-10-CM

## 2021-04-29 LAB — CBC WITH DIFFERENTIAL (CANCER CENTER ONLY)
Abs Immature Granulocytes: 0 10*3/uL (ref 0.00–0.07)
Basophils Absolute: 0 10*3/uL (ref 0.0–0.1)
Basophils Relative: 1 %
Eosinophils Absolute: 0.1 10*3/uL (ref 0.0–0.5)
Eosinophils Relative: 3 %
HCT: 40.8 % (ref 36.0–46.0)
Hemoglobin: 14.6 g/dL (ref 12.0–15.0)
Immature Granulocytes: 0 %
Lymphocytes Relative: 44 %
Lymphs Abs: 2 10*3/uL (ref 0.7–4.0)
MCH: 34.2 pg — ABNORMAL HIGH (ref 26.0–34.0)
MCHC: 35.8 g/dL (ref 30.0–36.0)
MCV: 95.6 fL (ref 80.0–100.0)
Monocytes Absolute: 0.5 10*3/uL (ref 0.1–1.0)
Monocytes Relative: 11 %
Neutro Abs: 1.9 10*3/uL (ref 1.7–7.7)
Neutrophils Relative %: 41 %
Platelet Count: 159 10*3/uL (ref 150–400)
RBC: 4.27 MIL/uL (ref 3.87–5.11)
RDW: 11.9 % (ref 11.5–15.5)
WBC Count: 4.6 10*3/uL (ref 4.0–10.5)
nRBC: 0 % (ref 0.0–0.2)

## 2021-04-29 LAB — CMP (CANCER CENTER ONLY)
ALT: 29 U/L (ref 0–44)
AST: 19 U/L (ref 15–41)
Albumin: 4.2 g/dL (ref 3.5–5.0)
Alkaline Phosphatase: 50 U/L (ref 38–126)
Anion gap: 12 (ref 5–15)
BUN: 16 mg/dL (ref 8–23)
CO2: 25 mmol/L (ref 22–32)
Calcium: 9.8 mg/dL (ref 8.9–10.3)
Chloride: 104 mmol/L (ref 98–111)
Creatinine: 0.88 mg/dL (ref 0.44–1.00)
GFR, Estimated: >60 mL/min (ref >60)
Glucose, Bld: 115 mg/dL — ABNORMAL HIGH (ref 70–99)
Potassium: 3.6 mmol/L (ref 3.5–5.1)
Sodium: 141 mmol/L (ref 135–145)
Total Bilirubin: 0.6 mg/dL (ref 0.3–1.2)
Total Protein: 7.6 g/dL (ref 6.5–8.1)

## 2021-04-29 MED ORDER — SODIUM CHLORIDE 0.9 % IV SOLN
Freq: Once | INTRAVENOUS | Status: AC
  Filled 2021-04-29: qty 250

## 2021-04-29 MED ORDER — ZOLEDRONIC ACID 4 MG/100ML IV SOLN
4.0000 mg | Freq: Once | INTRAVENOUS | Status: AC
  Administered 2021-04-29: 4 mg via INTRAVENOUS
  Filled 2021-04-29: qty 100

## 2021-04-29 NOTE — Patient Instructions (Signed)

## 2021-05-07 ENCOUNTER — Other Ambulatory Visit: Payer: Self-pay | Admitting: Family Medicine

## 2021-05-08 ENCOUNTER — Encounter: Payer: Self-pay | Admitting: Hematology

## 2021-05-08 DIAGNOSIS — R922 Inconclusive mammogram: Secondary | ICD-10-CM | POA: Diagnosis not present

## 2021-05-08 DIAGNOSIS — Z853 Personal history of malignant neoplasm of breast: Secondary | ICD-10-CM | POA: Diagnosis not present

## 2021-05-17 ENCOUNTER — Ambulatory Visit: Payer: Medicare Other

## 2021-05-17 ENCOUNTER — Other Ambulatory Visit: Payer: Medicare Other

## 2021-05-20 ENCOUNTER — Other Ambulatory Visit: Payer: Self-pay | Admitting: Family Medicine

## 2021-06-12 DIAGNOSIS — Z23 Encounter for immunization: Secondary | ICD-10-CM | POA: Diagnosis not present

## 2021-06-24 ENCOUNTER — Other Ambulatory Visit: Payer: Medicare Other

## 2021-06-24 ENCOUNTER — Inpatient Hospital Stay: Payer: Medicare Other

## 2021-06-24 ENCOUNTER — Inpatient Hospital Stay: Payer: Medicare Other | Admitting: Hematology

## 2021-07-08 ENCOUNTER — Ambulatory Visit (INDEPENDENT_AMBULATORY_CARE_PROVIDER_SITE_OTHER): Payer: Medicare Other | Admitting: Family Medicine

## 2021-07-08 ENCOUNTER — Encounter: Payer: Self-pay | Admitting: Family Medicine

## 2021-07-08 VITALS — BP 125/59 | HR 78 | Ht 63.0 in | Wt 178.0 lb

## 2021-07-08 DIAGNOSIS — Z23 Encounter for immunization: Secondary | ICD-10-CM

## 2021-07-08 DIAGNOSIS — G479 Sleep disorder, unspecified: Secondary | ICD-10-CM | POA: Diagnosis not present

## 2021-07-08 DIAGNOSIS — N760 Acute vaginitis: Secondary | ICD-10-CM

## 2021-07-08 DIAGNOSIS — R7301 Impaired fasting glucose: Secondary | ICD-10-CM

## 2021-07-08 DIAGNOSIS — R21 Rash and other nonspecific skin eruption: Secondary | ICD-10-CM | POA: Diagnosis not present

## 2021-07-08 DIAGNOSIS — E039 Hypothyroidism, unspecified: Secondary | ICD-10-CM | POA: Diagnosis not present

## 2021-07-08 DIAGNOSIS — I10 Essential (primary) hypertension: Secondary | ICD-10-CM | POA: Diagnosis not present

## 2021-07-08 LAB — POCT GLYCOSYLATED HEMOGLOBIN (HGB A1C): Hemoglobin A1C: 5.5 % (ref 4.0–5.6)

## 2021-07-08 NOTE — Assessment & Plan Note (Signed)
Any labs done in a couple of weeks and will likely have her thyroid checked then.  Last TSH in June looked perfect at 1.8.

## 2021-07-08 NOTE — Assessment & Plan Note (Signed)
Well controlled. Continue current regimen. Follow up in  6 mo  

## 2021-07-08 NOTE — Progress Notes (Signed)
Established Patient Office Visit  Subjective:  Patient ID: Dawn [name], female    DOB: August 21, 1951  Age: 70 y.o. MRN: [id number]  CC:  Chief Complaint  Patient presents with   Hypertension    HPI Dawn [name] presents for   Hypertension- Pt denies chest pain, SOB, dizziness, or heart palpitations.  Taking meds as directed w/o problems.  Denies medication side effects.    Impaired fasting glucose-no increased thirst or urination. No symptoms consistent with hypoglycemia.  Had COVID a couple of weeks ago she is actually feeling much better.  She was using some Mucinex initially but now just using some Zyrtec.  She was biking over the last week and has noticed a little bit of an itchy red rash in the left groin crease and a little bit vaginally.  Also reports that she has been struggling with sleep lately she is had a hard time falling asleep sometimes it takes a couple hours.  When she is asleep she usually does okay though she does have to get up and pee a lot but can usually fall back asleep.  She occasionally will take a Tylenol and that seems to help.  Sometimes her neuropathy bothers her at night.  Most the time during the day it does not really cause any issues but sometimes at night she will feel the tingling she does not really use the gabapentin.  Still struggling with some right wrist pain for which she has seen Dr. Dianah Field previously.  Sometimes she says it just feels like it is getting give out.  Hypothyroidism - Taking medication regularly in the AM away from food and vitamins, etc. No recent change to skin, hair, or energy levels.   Past Medical History:  Diagnosis Date   Anemia    as a child.   Arthritis    Atypical mole 06/04/2018   moderate top of right foot   Atypical mole 06/04/2018   left elbow mild   Atypical mole 06/04/2018   left upper back mild   Bilateral cataracts    Bilateral leg cramps    Cancer ([place])    skin   Dyspnea     Endometrial cancer ([place])    Family history of bladder cancer    Family history of colon cancer in father    Fatty liver    Fuchs' corneal dystrophy    GERD (gastroesophageal reflux disease)    Hearing loss    Right side 30%   Heartburn    History of hiatal hernia    small size   History of radiation therapy 08/21/17, 08/28/17,09/01/17, 09/11/17, 09/17/17   Vaginal cuff brachytherapy.    History of radiation therapy 11/11/17- 12/08/17   Right Breast treated to 40.05 Gy with 15 fx of 2.67 Gy and a boost of 10 Gy with 5 fx.    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, mother had when patient was child   Uterine cancer ([place]) 04/2017   endometrial cancer   Varices, gastric    Vertigo     Past Surgical History:  Procedure Laterality Date   BREAST BIOPSY Right 04/27/2017  BREAST LUMPECTOMY WITH RADIOACTIVE SEED AND SENTINEL LYMPH NODE BIOPSY Right 05/25/2017   Procedure: RIGHT BREAST LUMPECTOMY WITH RADIOACTIVE SEED AND  RIGHT AXILLARY SENTINEL LYMPH NODE BIOPSY;  Surgeon: Excell Seltzer, MD;  Location: [facility];  Service: General;  Laterality: Right;   CATARACT EXTRACTION, BILATERAL     COLONOSCOPY     HYSTEROSCOPY WITH D & C N/A 04/29/2017   Procedure: DILATATION AND CURETTAGE /HYSTEROSCOPY;  Surgeon: Linda Hedges, DO;  Location: [facility];  Service: Gynecology;  Laterality: N/A;   PORT-A-CATH REMOVAL Left 04/16/2021   Procedure: REMOVAL PORT-A-CATH;  Surgeon: Johnathan Hausen, MD;  Location: [facility];  Service: General;  Laterality: Left;   PORTACATH PLACEMENT Left 05/25/2017   Procedure: INSERTION PORT-A-CATH;  Surgeon: Excell Seltzer, MD;  Location: [facility];  Service: General;  Laterality: Left;   ROBOTIC ASSISTED TOTAL HYSTERECTOMY WITH BILATERAL SALPINGO OOPHERECTOMY N/A 06/09/2017   Procedure: XI ROBOTIC ASSISTED TOTAL LAPOROSCOPIC HYSTERECTOMY WITH BILATERAL SALPINGO OOPHORECTOMY;  Surgeon: Everitt Amber, MD;  Location: [facility];  Service: Gynecology;  Laterality: N/A;   SENTINEL NODE BIOPSY N/A 06/09/2017   Procedure: SENTINEL NODE BIOPSY;  Surgeon: Everitt Amber, MD;  Location: [facility];  Service: Gynecology;  Laterality: N/A;    Family History  Problem Relation Age of Onset   Colon cancer Father 24   Heart attack Father 8   Prostate cancer Father        dx 68's   Bladder Cancer Father 54   Hypertension Father    Hyperlipidemia Father    Heart disease Father    Obesity Father    Stroke Mother 57   Depression Mother    Anxiety disorder Mother    Bipolar disorder Mother    Obesity Mother    Diabetes Maternal Grandfather    Kidney disease Maternal Grandfather    Asthma Brother    Cancer Paternal Grandmother 67       GYN cancer ( thinks ovarian, maybe uterine)   Heart attack Maternal Grandmother 70   Breast cancer Other 48   Colon cancer Other 55   Cancer Other        type unk, age dx unk    Social History   Socioeconomic History   Marital status: Married    Spouse name: Not on file   Number of children: Not on file   Years of education: Not on file   Highest education level: Not on file  Occupational History    Employer: [employer]    Comment: Museum/gallery conservator   Tobacco Use   Smoking status: Never   Smokeless tobacco: Never  Vaping Use   Vaping Use: Never used  Substance and Sexual Activity   Alcohol use: Not Currently    Alcohol/week: 0.0 standard drinks    Comment: <1/week wine or beer occasional   Drug use: No   Sexual activity: Yes    Birth control/protection: Post-menopausal  Other Topics Concern   Not on file  Social History Narrative   2-3 caffeine drinks per day. Regular  exercise.  Walking 3 miles a day.    Social Determinants of Health   Financial Resource Strain: Not on file  Food Insecurity: Not on file  Transportation Needs: Not on file  Physical Activity: Not on file  Stress: Not on file  Social Connections: Not on file  Intimate Partner Violence: Not on file    Outpatient Medications Prior to Visit  Medication Sig Dispense Refill  acetaminophen (TYLENOL) 500 MG tablet Take 500 mg by mouth every 6 (six) hours as needed for moderate pain.     Alpha-Lipoic Acid 600 MG CAPS Take 1 capsule (600 mg total) by mouth daily. 90 capsule 3   anastrozole (ARIMIDEX) 1 MG tablet TAKE 1 TABLET BY MOUTH  DAILY 90 tablet 3   Ascorbic Acid (VITAMIN C) 1000 MG tablet Take 1,000 mg by mouth daily.     atorvastatin (LIPITOR) 10 MG tablet TAKE 1 TABLET BY MOUTH  DAILY 90 tablet 3   b complex vitamins capsule Take 1 capsule by mouth daily.     Cholecalciferol (VITAMIN D3) 75 MCG (3000 UT) TABS Take 1 capsule by mouth daily.     fluticasone (FLONASE) 50 MCG/ACT nasal spray Place 2 sprays into both nostrils daily.     levothyroxine (SYNTHROID) 50 MCG tablet TAKE 1 TABLET BY MOUTH  DAILY 90 tablet 3   Multiple Vitamin (MULTIVITAMIN) capsule Take 1 capsule by mouth daily.     olmesartan-hydrochlorothiazide (BENICAR HCT) 20-12.5 MG tablet TAKE 1 TABLET BY MOUTH  DAILY 90 tablet 3   pantoprazole (PROTONIX) 20 MG tablet TAKE 1 TABLET BY MOUTH  DAILY 90 tablet 3   potassium chloride SA (KLOR-CON) 20 MEQ tablet TAKE 1 TABLET BY MOUTH  DAILY 90 tablet 3   pyridOXINE (VITAMIN B-6) 50 MG tablet Take 1 tablet (50 mg total) by mouth daily. 90 tablet 3   sodium chloride (MURO 128) 5 % ophthalmic solution Place 2 drops into both eyes daily.     diphenhydrAMINE (BENADRYL) 25 MG tablet Take 25-50 mg by mouth every 6 (six) hours as needed for sleep.     lidocaine-prilocaine (EMLA) cream APPLY TO AFFECTED AREA ONCE 30 g 2   AMBULATORY NON FORMULARY MEDICATION Medication Name: Shingrix IM  x 1.  Repeat in 2-6 months. 1 Units 0   No facility-administered medications prior to visit.    Allergies  Allergen Reactions   Ciprofloxacin Hives   Paclitaxel Rash   Crestor [Rosuvastatin Calcium] Other (See Comments)    Myalgias    Fosamax [Alendronate Sodium] Other (See Comments)    reflux   Livalo [Pitavastatin] Other (See Comments)    Difficulty walking. Weakness in legs.    Penicillins Rash    Has patient had a PCN reaction causing immediate rash, facial/tongue/throat swelling, SOB or lightheadedness with hypotension: Yes Has patient had a PCN reaction causing severe rash involving mucus membranes or skin necrosis: Yes Has patient had a PCN reaction that required hospitalization: No Has patient had a PCN reaction occurring within the last 10 years: No If all of the above answers are "NO", then may proceed with Cephalosporin use.     ROS Review of Systems    Objective:    Physical Exam Constitutional:      Appearance: Normal appearance. She is well-developed.  HENT:     Head: Normocephalic and atraumatic.  Cardiovascular:     Rate and Rhythm: Normal rate and regular rhythm.     Heart sounds: Normal heart sounds.  Pulmonary:     Effort: Pulmonary effort is normal.     Breath sounds: Normal breath sounds.  Skin:    General: Skin is warm and dry.  Neurological:     Mental Status: She is alert and oriented to person, place, and time.  Psychiatric:        Behavior: Behavior normal.   BP (!) 125/59   Pulse 78   Ht _0  (1.6 m)  Wt 178 lb (80.7 kg)   SpO2 97%   BMI 31.53 kg/m  Wt Readings from Last 3 Encounters:  07/08/21 178 lb (80.7 kg)  04/29/21 176 lb 12 oz (80.2 kg)  04/16/21 177 lb 7.5 oz (80.5 kg)     Health Maintenance Due  Topic Date Due   Zoster Vaccines- Shingrix (1 of 2) Never done   Pneumonia Vaccine 77+ Years old (2 - PPSV23 if available, else PCV20) 06/29/2018   COVID-19 Vaccine (4 - Booster for Pfizer series) 08/07/2020    There  are no preventive care reminders to display for this patient.  Lab Results  Component Value Date   TSH 1.820 02/18/2021   Lab Results  Component Value Date   WBC 4.6 04/29/2021   HGB 14.6 04/29/2021   HCT 40.8 04/29/2021   MCV 95.6 04/29/2021   PLT 159 04/29/2021   Lab Results  Component Value Date   NA 141 04/29/2021   K 3.6 04/29/2021   CHLORIDE 101 09/02/2017   CO2 25 04/29/2021   GLUCOSE 115 (H) 04/29/2021   BUN 16 04/29/2021   CREATININE 0.88 04/29/2021   BILITOT 0.6 04/29/2021   ALKPHOS 50 04/29/2021   AST 19 04/29/2021   ALT 29 04/29/2021   PROT 7.6 04/29/2021   ALBUMIN 4.2 04/29/2021   CALCIUM 9.8 04/29/2021   ANIONGAP 12 04/29/2021   EGFR 77 02/18/2021   Lab Results  Component Value Date   CHOL 178 02/18/2021   Lab Results  Component Value Date   HDL 61 02/18/2021   Lab Results  Component Value Date   LDLCALC 97 02/18/2021   Lab Results  Component Value Date   TRIG 110 02/18/2021   Lab Results  Component Value Date   CHOLHDL 2.9 02/18/2021   Lab Results  Component Value Date   HGBA1C 5.5 07/08/2021      Assessment & Plan:   Problem List Items Addressed This Visit       Cardiovascular and Mediastinum   HYPERTENSION, BENIGN - Primary    Well controlled. Continue current regimen. Follow up in  6 mo         Endocrine   IFG (impaired fasting glucose)    Well controlled. Continue current regimen. Follow up in  6 mo       Relevant Orders   POCT glycosylated hemoglobin (Hb A1C) (Completed)   Hypothyroid    Any labs done in a couple of weeks and will likely have her thyroid checked then.  Last TSH in June looked perfect at 1.8.      Other Visit Diagnoses     Need for immunization against influenza       Relevant Orders   Flu Vaccine QUAD High Dose(Fluad) (Completed)   Sleep disturbance       Rash       Acute vaginitis       Relevant Orders   WET PREP FOR [place], YEAST, CLUE      Vaginitis-we will evaluate with wet prep.  We  will call with results once available.  Does have a very mild external rash which could be treated with nystatin cream.  But I will not await the vaginal swab results in case she needs oral Diflucan as well.  Sleep disturbance-recommended trial of over-the-counter melatonin recommend start with 3 mg and gradually go up as tolerated monitor for any sedation etc.  No orders of the defined types were placed in this encounter.   Follow-up: No follow-ups on  file.    Beatrice Lecher, MD

## 2021-07-08 NOTE — Patient Instructions (Signed)
If you continue to have sleep problems please let me know but I would recommend a trial of melatonin.  You can start with 3 mg and see if that is helpful.

## 2021-07-09 ENCOUNTER — Other Ambulatory Visit (HOSPITAL_COMMUNITY): Payer: Self-pay

## 2021-07-09 ENCOUNTER — Encounter: Payer: Self-pay | Admitting: Hematology

## 2021-07-09 LAB — WET PREP FOR TRICH, YEAST, CLUE
MICRO NUMBER:: 12545068
Specimen Quality: ADEQUATE

## 2021-07-09 MED ORDER — NYSTATIN 100000 UNIT/GM EX CREA
1.0000 "application " | TOPICAL_CREAM | Freq: Two times a day (BID) | CUTANEOUS | 0 refills | Status: DC
  Filled 2021-07-09: qty 30, 15d supply, fill #0

## 2021-07-09 NOTE — Progress Notes (Signed)
Hi Deanza, the vaginal swab was negative for yeast or bacteria.  I am getting go ahead and just send over a topical antibiotic yeast cream.  If its not helping or if you still can continue to have vaginal symptoms then please let me know.

## 2021-07-09 NOTE — Addendum Note (Signed)
Addended by: Beatrice Lecher D on: 07/09/2021 09:07 AM   Modules accepted: Orders

## 2021-07-15 ENCOUNTER — Inpatient Hospital Stay (HOSPITAL_BASED_OUTPATIENT_CLINIC_OR_DEPARTMENT_OTHER): Payer: Medicare Other | Admitting: Hematology

## 2021-07-15 ENCOUNTER — Other Ambulatory Visit: Payer: Self-pay

## 2021-07-15 ENCOUNTER — Other Ambulatory Visit (HOSPITAL_BASED_OUTPATIENT_CLINIC_OR_DEPARTMENT_OTHER): Payer: Self-pay

## 2021-07-15 ENCOUNTER — Inpatient Hospital Stay: Payer: Medicare Other | Attending: Hematology

## 2021-07-15 ENCOUNTER — Encounter: Payer: Self-pay | Admitting: Hematology

## 2021-07-15 VITALS — BP 124/75 | HR 77 | Temp 98.3°F | Resp 18 | Ht 63.0 in | Wt 178.7 lb

## 2021-07-15 DIAGNOSIS — Z8542 Personal history of malignant neoplasm of other parts of uterus: Secondary | ICD-10-CM | POA: Insufficient documentation

## 2021-07-15 DIAGNOSIS — C541 Malignant neoplasm of endometrium: Secondary | ICD-10-CM | POA: Diagnosis not present

## 2021-07-15 DIAGNOSIS — Z170 Estrogen receptor positive status [ER+]: Secondary | ICD-10-CM

## 2021-07-15 DIAGNOSIS — M8580 Other specified disorders of bone density and structure, unspecified site: Secondary | ICD-10-CM | POA: Diagnosis not present

## 2021-07-15 DIAGNOSIS — Z923 Personal history of irradiation: Secondary | ICD-10-CM | POA: Insufficient documentation

## 2021-07-15 DIAGNOSIS — C50211 Malignant neoplasm of upper-inner quadrant of right female breast: Secondary | ICD-10-CM

## 2021-07-15 DIAGNOSIS — Z9221 Personal history of antineoplastic chemotherapy: Secondary | ICD-10-CM | POA: Diagnosis not present

## 2021-07-15 DIAGNOSIS — R946 Abnormal results of thyroid function studies: Secondary | ICD-10-CM | POA: Diagnosis not present

## 2021-07-15 DIAGNOSIS — E039 Hypothyroidism, unspecified: Secondary | ICD-10-CM | POA: Diagnosis not present

## 2021-07-15 DIAGNOSIS — Z90710 Acquired absence of both cervix and uterus: Secondary | ICD-10-CM | POA: Diagnosis not present

## 2021-07-15 DIAGNOSIS — I10 Essential (primary) hypertension: Secondary | ICD-10-CM | POA: Insufficient documentation

## 2021-07-15 DIAGNOSIS — Z90722 Acquired absence of ovaries, bilateral: Secondary | ICD-10-CM | POA: Insufficient documentation

## 2021-07-15 DIAGNOSIS — E7800 Pure hypercholesterolemia, unspecified: Secondary | ICD-10-CM | POA: Insufficient documentation

## 2021-07-15 DIAGNOSIS — Z9079 Acquired absence of other genital organ(s): Secondary | ICD-10-CM | POA: Insufficient documentation

## 2021-07-15 DIAGNOSIS — Z79899 Other long term (current) drug therapy: Secondary | ICD-10-CM | POA: Insufficient documentation

## 2021-07-15 DIAGNOSIS — Z79811 Long term (current) use of aromatase inhibitors: Secondary | ICD-10-CM | POA: Insufficient documentation

## 2021-07-15 LAB — CBC WITH DIFFERENTIAL (CANCER CENTER ONLY)
Abs Immature Granulocytes: 0.01 10*3/uL (ref 0.00–0.07)
Basophils Absolute: 0 10*3/uL (ref 0.0–0.1)
Basophils Relative: 1 %
Eosinophils Absolute: 0.1 10*3/uL (ref 0.0–0.5)
Eosinophils Relative: 3 %
HCT: 39.3 % (ref 36.0–46.0)
Hemoglobin: 14.1 g/dL (ref 12.0–15.0)
Immature Granulocytes: 0 %
Lymphocytes Relative: 40 %
Lymphs Abs: 1.7 10*3/uL (ref 0.7–4.0)
MCH: 34.1 pg — ABNORMAL HIGH (ref 26.0–34.0)
MCHC: 35.9 g/dL (ref 30.0–36.0)
MCV: 94.9 fL (ref 80.0–100.0)
Monocytes Absolute: 0.4 10*3/uL (ref 0.1–1.0)
Monocytes Relative: 9 %
Neutro Abs: 2 10*3/uL (ref 1.7–7.7)
Neutrophils Relative %: 47 %
Platelet Count: 177 10*3/uL (ref 150–400)
RBC: 4.14 MIL/uL (ref 3.87–5.11)
RDW: 12.3 % (ref 11.5–15.5)
WBC Count: 4.3 10*3/uL (ref 4.0–10.5)
nRBC: 0 % (ref 0.0–0.2)

## 2021-07-15 LAB — CMP (CANCER CENTER ONLY)
ALT: 31 U/L (ref 0–44)
AST: 21 U/L (ref 15–41)
Albumin: 4.3 g/dL (ref 3.5–5.0)
Alkaline Phosphatase: 38 U/L (ref 38–126)
Anion gap: 9 (ref 5–15)
BUN: 13 mg/dL (ref 8–23)
CO2: 26 mmol/L (ref 22–32)
Calcium: 9.4 mg/dL (ref 8.9–10.3)
Chloride: 104 mmol/L (ref 98–111)
Creatinine: 0.82 mg/dL (ref 0.44–1.00)
GFR, Estimated: >60 mL/min (ref >60)
Glucose, Bld: 133 mg/dL — ABNORMAL HIGH (ref 70–99)
Potassium: 3.6 mmol/L (ref 3.5–5.1)
Sodium: 139 mmol/L (ref 135–145)
Total Bilirubin: 0.7 mg/dL (ref 0.3–1.2)
Total Protein: 7.6 g/dL (ref 6.5–8.1)

## 2021-07-15 NOTE — Progress Notes (Signed)
[place]   [phone] [phone]   Clinic Follow up Note   Patient Care Team: [name], MD as PCP - Haskell Riling, MD (Inactive) as Consulting Physician (General Surgery) Truitt Merle, MD as Consulting Physician (Hematology) [name], MD as Attending Physician (Radiation Oncology)  Date of Service:  07/15/2021  CHIEF COMPLAINT: f/u of breast and endometrial cancers  CURRENT THERAPY:  Letrozole 2.71m daily starting 12/23/17. Switched to Anastrozole in 04/2018 due to joint pain Zometa q670monthfor 2 years starting 10/24/20  ASSESSMENT & PLAN:  Dawn BURNETTs a [age].o. female with   1. 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 Treated with right breast lumpectomy, adjuvant chemo and radiation. She tried Nerlynx but could not tolerate due to diarrhea, and does not want try again. -She started anti-estrogen therapy with letrozole in 12/2017. Due to joint pain I switched her to Anastrozole in 04/2018. She is tolerating well with manageable joint stiffness. Plan to continue for 7 years due to high risk disease.  -Last screening breast MRI in 11/2020 and MM on 05/08/21 were benign. -She is clinically doing well. Labs reviewed, CBC is WNL, CMP with BG of 133. Physical exam is unremarkable. There is no clinical concern for recurrence. -Continue anastrozole  -f/u in 4 months.    2. Endometrial adenocarcinoma with lymph node metastasis, pT1apN1M0, FIGO stage IIIc, MSI-H, probable RP node recurrence in 12/2017  -Diagnosed in 04/2017. Treated with hysterectomy, BSO, adjuvant radiation, and chemo with Carbo/taxol. -genetics 04/2017 were negative with VUS in GATA2. -She had retroperitoneal lymph node metastasis in April 2019.  Has completed 2 years of Keytruda in May 2021. She has recovered well.  -CT CAP from 10/22/20 was NED -Continue with  surveillance. Repeat CT CAP in 10/2021. This will be her last surveillant CT scan if NED   3. HTN, Acid reflux, High Cholesterol  -Well Controlled, Continue to f/u with PCP  -She was seen to have Gastritis on 11/22/19 upper endoscopy/colonoscopy. She is on PPI.     4. Osteopenia  -most recent DEXA scan in 07/2020 revealed T score of -2.4. -She was on monthly boniva, prescribed by her PCP, since 02/2018. I switched her to Zometa q6m61monthor 2 years starting 10/24/20.  -Continue Vitamin D and Calcium supplements. Will check her Vitamin D level every 6 months.     5. Metabolic syndrome, hyperglycemia -She has no history of diabetes.  -She was recently found to have abnormal TSH levels. She has been seen by endocrinologist and has started levothyroxine on 01/04/19. Will monitor TSH level.  -Her weight has been stable in the 170's. -Continue to follow-up with weight management clinic    6. Arthritis, Neuropathy G2 -She has arthritis, mainly in her back. She will take Tylenol as needed.  -Managed by Sports Medicine Dr. TheDianah Fieldhe was started on B6 and alphalogic acid supplement for her neuropathy. Stable.  -she declined to participate in ACC[facility]-2102 neuropathy study     Plan  -f/u and Zometa infusion in 4 months, with lab and CT several days before.   No problem-specific Assessment & Plan notes found for this encounter.   SUMMARY OF ONCOLOGIC HISTORY: Oncology History Overview Note  MSI high Cancer Staging Endometrial cancer ([facility] form: Corpus Uteri - Adenosarcoma, AJCC 8th Edition - Pathologic stage from 06/09/2017: Stage IIIC (pT1a, pN1, cM0) - Signed by FenTruitt MerleD on  06/16/2017  Malignant neoplasm of upper-inner quadrant of right breast in female, estrogen receptor positive ([place]) Staging form: Breast, AJCC 8th Edition - Clinical stage from 05/06/2017: Stage IA (cT1c, cN0, cM0, G2, ER: Positive, PR: Negative, HER2: Positive) - Unsigned - Pathologic stage from  05/25/2017: Stage IIA (pT2, pN0, cM0, G2, ER: Positive, PR: Negative, HER2: Negative) - Signed by Truitt Merle, MD on 06/16/2017     Malignant neoplasm of upper-inner quadrant of right breast in female, estrogen receptor positive ([place])  04/27/2017 Initial Biopsy   Diagnosis Breast, right, needle core biopsy INVASIVE DUCTAL CARCINOMA, GRADE 2 Microscopic Comment The neoplasm has intracellular mucin and signet ring cell features. Immunostains shows these cells are positive for ER,GATA3, ck7 and GCDFP, negative for ck20, cdx2, TTF-1 and pax8, The immunostaining pattern supports the neoplasm is breast primary.   04/27/2017 Receptors her2   Estrogen Receptor: 70%, POSITIVE, MODERATE STAINING INTENSITY Progesterone Receptor: 0%, NEGATIVE Proliferation Marker Ki67: 30% HER2 - **POSITIVE** RATIO OF HER2/CEP17 SIGNALS 2.91 AVERAGE HER2 COPY NUMBER PER CELL 7.28   04/27/2017 Initial Diagnosis   Malignant neoplasm of upper-inner quadrant of right breast in female, estrogen receptor positive ([place])   05/07/2017 Imaging   CT Chest W Contrast 05/07/17 IMPRESSION: Tiny well-defined fatty lesion on the pleura at the right lung base. The thinner slice collimation used for today's chest CT eliminates volume-averaging seen in the lesion on the prior exam and confirms that this is a diffusely fatty nodule. This is a benign finding and likely represents a tiny lipoma. No defect in the hemidiaphragm evident to suggest tiny diaphragmatic hernia. Pulmonary hamartoma a consideration although the lack of soft tissue components makes this less likely.   05/15/2017 Genetic Testing   Patient had genetic testing due to a personal history of breast cancer and uterine cancer as well as a family history of cancer. The Multi-Cancer panel was ordered. The Multi-Cancer Panel offered by [facility] includes sequencing and/or deletion duplication testing of the following 83 genes: ALK, APC, ATM, AXIN2,BAP1,  BARD1, BLM, BMPR1A,  BRCA1, BRCA2, BRIP1, CASR, CDC73, CDH1, CDK4, CDKN1B, CDKN1C, CDKN2A (p14ARF), CDKN2A (p16INK4a), CEBPA, CHEK2, CTNNA1, DICER1, DIS3L2, EGFR (c.2369C>T, p.Thr790Met variant only), EPCAM (Deletion/duplication testing only), FH, FLCN, GATA2, GPC3, GREM1 (Promoter region deletion/duplication testing only), HOXB13 (c.251G>A, p.Gly84Glu), HRAS, KIT, MAX, MEN1, MET, MITF (c.952G>A, p.Glu318Lys variant only), MLH1, MSH2, MSH3, MSH6, MUTYH, NBN, NF1, NF2, NTHL1, PALB2, PDGFRA, PHOX2B, PMS2, POLD1, POLE, POT1, PRKAR1A, PTCH1, PTEN, RAD50, RAD51C, RAD51D, RB1, RECQL4, RET, RUNX1, SDHAF2, SDHA (sequence changes only), SDHB, SDHC, SDHD, SMAD4, SMARCA4, SMARCB1, SMARCE1, STK11, SUFU, TERC, TERT, TMEM127, TP53, TSC1, TSC2, VHL, WRN and WT1.    Results: No pathogenic mutations were identified. A VUS in the GATA2 gene c.1348G>A (p.Gly450Arg) was identified.  The date of this test report is 05/25/2017.     05/25/2017 Surgery   RIGHT BREAST LUMPECTOMY WITH RADIOACTIVE SEED AND  RIGHT AXILLARY SENTINEL LYMPH NODE BIOPSY and Pot placement by Dr. Excell Seltzer 05/25/17   05/25/2017 Pathology Results   Diagnosis 05/25/17 1. Breast, lumpectomy, right - INVASIVE DUCTAL CARCINOMA, GRADE II/III, SPANNING 2.2 CM. - DUCTAL CARCINOMA IN SITU, HIGH GRADE. - THE SURGICAL RESECTION MARGINS ARE NEGATIVE FOR CARCINOMA. - SEE ONCOLOGY TABLE BELOW. 2. Breast, excision, right additional superior margin - BENIGN FIBROADIPOSE TISSUE. - BENIGN SKELETAL MUSCLE. - SEE COMMENT. 3. Breast, excision, right additional lateral margin - BENIGN BREAST PARENCHYMA. - THERE IS NO EVIDENCE OF MALIGNANCY. - SEE COMMENT. 4. Breast, excision, chest wall margin - BENIGN FIBROADIPOSE TISSUE. - THERE  IS NO EVIDENCE OF MALIGNANCY. - SEE COMMENT. 5. Lymph node, sentinel, biopsy, right axillary - THERE IS NO EVIDENCE OF CARCINOMA IN 1 OF 1 LYMPH NODE (0/1). 6. Lymph node, sentinel, biopsy, right axillary - THERE IS NO EVIDENCE OF CARCINOMA IN 1 OF 1 LYMPH  NODE (0/1). 7. Lymph node, sentinel, biopsy, right axillary - THERE IS NO EVIDENCE OF CARCINOMA IN 1 OF 1 LYMPH NODE (0/1).   06/26/2017 PET scan   PET  IMPRESSION: 1. Postoperative findings both in the right breast and in the anatomic pelvis, with associated low-grade activity considered to be postoperative in nature. No hypermetabolic adenopathy or hypermetabolic lesions are identified to suggest active metastatic disease/malignancy. 2. Other imaging findings of potential clinical significance: Aortic Atherosclerosis (ICD10-I70.0). Sigmoid colon diverticulosis. Biapical pleuroparenchymal scarring in the lungs. Small amount of free pelvic fluid in the cul-de-sac, likely postoperative.     07/01/2017 - 10/14/2017 Chemotherapy   Adjuvant TCH with Onpro every 3 weeks for 6 cycles starting on 07/01/17, Changed Taxol to abraxane with cycle 4 due to drug rash reaction, followed by Herceptin every 3 weeks for 6 months    11/01/2017 - 12/08/2017 Radiation Therapy   Radiation therapy to her right breast with [name]   11/04/2017 - 06/2018 Chemotherapy   Maintenance Herceptin starting 11/04/17 and will comeplete her 12 months in 06/2018    12/23/2017 -  Anti-estrogen oral therapy   Adjuvant letrozole 2.5 mg daily started 12/23/17, switched to exemestane on 04/21/18 due to joint pain. Could not afford aromasin, started anastrozole 04/2018.    12/30/2017 Imaging   CT CAP W Contrast 12/30/17 IMPRESSION: Increased size of 1.1 cm aorto-caval retroperitoneal lymph node. This could be reactive due to interval hysterectomy, however metastatic carcinoma cannot be excluded. Consider continued attention on short-term follow-up CT, or PET-CT scan for further evaluation.   Mild hepatic steatosis.   01/25/2018 PET scan   IMPRESSION: 1. Enlarging hypermetabolic aortocaval lymph node is worrisome for metastatic disease. 2. Probable postoperative seroma in the medial right breast, with associated mild  inflammatory hypermetabolism.   04/28/2018 Mammogram   Probably benign. Calcifications in the right breast most likely are dystrophic, related to previous surgery, and are probably benign.    04/28/2018 Imaging   DEXA Scan T Score -2.0   06/10/2018 Echocardiogram   06/10/2018 ECHO LV EF: 55% -   60%   08/28/2018 - 08/2018 Chemotherapy   Neratinib 64m for 1 week then titrate up with 1 additional tablet weekly up to 12 mg starting 08/28/18. Stopped soon after starting due to diarrhea.   10/25/2018 Imaging   CT CAP 10/25/18  IMPRESSION: 1. No evidence of metastatic disease. 2.  Aortic atherosclerosis (ICD10-170.0).   06/15/2019 Pathology Results   Diagnosis Breast, right, needle core biopsy, 1 o'clock, 7cmfn - DENSE FIBROSIS CONSISTENT WITH PRIOR PROCEDURAL CHANGES. - NO MALIGNANCY IDENTIFIED.   10/24/2019 Imaging   CT CAP W Contrast  IMPRESSION: 1. Stable exam. No evidence of recurrent or metastatic carcinoma within the chest, abdomen, or pelvis. 2. Colonic diverticulosis, without radiographic evidence of diverticulitis.   Aortic Atherosclerosis (ICD10-I70.0).   12/05/2020 Imaging   MRI Breast  IMPRESSION: No suspicious areas of enhancement identified within the right or left breast to suggest malignancy.   [place]ometrial adenocarcinoma ([place]  05/07/2017 Initial Diagnosis   [place]   06/09/2017 Surgery   XI ROBOTIC ASSISTED TOTAL LAPOROSCOPIC HYSTERECTOMY WITH BILATERAL SALPINGO OOPHORECTOMY and SENTINEL NODE BIOPSY by Dr. RDenman George9/25/18   06/09/2017 Pathology Results  Diagnosis 06/09/17 1. Lymph node, sentinel, biopsy, right external iliac - METASTATIC ADENOCARCINOMA IN ONE LYMPH NODE (1/1). 2. Lymph node, sentinel, biopsy, left obturator - ONE BENIGN LYMPH NODE (0/1). 3. Lymph node, sentinel, biopsy, left external iliac - METASTATIC ADENOCARCINOMA IN ONE LYMPH NODE (1/1). 4. Uterus +/- tubes/ovaries, neoplastic ENDOMETRIUM: - ENDOMETRIAL  ADENOCARCINOMA, 3.2 CM. - CARCINOMA INVADES INNER HALF OF MYOMETRIUM. 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 - CERVIX, BILATERAL FALLOPIAN TUBES AND BILATERAL OVARIES FREE OF TUMOR   08/18/2017 - 09/17/2017 Radiation Therapy   vaginal brachytherapy per 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   02/17/2018 - 02/08/2020 Antibody Plan   Keytruda every 3 weeks starting 02/17/18. Completed 2 years on 02/08/20   04/11/2019 Imaging   CT AP W Contrast  IMPRESSION: 1. No evidence of recurrent or metastatic carcinoma within the abdomen or pelvis. 2. Colonic diverticulosis. No radiographic evidence of diverticulitis.     04/23/2020 Imaging   CT AP W contrast  IMPRESSION: 1. Stable exam. Specifically, no findings to suggest recurrent or metastatic disease. 2. Left colonic diverticulosis without diverticulitis. 3. Aortic Atherosclerosis (ICD10-I70.0).     10/22/2020 Imaging   CT CAP  IMPRESSION: 1. No evidence of metastatic disease in the chest, abdomen or pelvis. 2. Small hiatal hernia. 3. Mild sigmoid diverticulosis. 4. Aortic Atherosclerosis (ICD10-I70.0).        INTERVAL HISTORY:  Dawn [name] is here for a follow up of breast and endometrial cancer. She was last seen by me on 02/25/21. She presents to the clinic alone. She reports continued neuropathy in her legs and feet, rated about 5/10. She notes she continues to work part time.   All other systems were reviewed with the patient and are negative.  MEDICAL HISTORY:  Past Medical History:  Diagnosis Date   Anemia    as a child.   Arthritis    Atypical mole 06/04/2018   moderate top of right foot   Atypical mole 06/04/2018   left elbow mild   Atypical mole 06/04/2018   left upper back mild   Bilateral cataracts    Bilateral leg cramps    Cancer ([place])    skin   Dyspnea    Endometrial cancer  ([place])    Family history of bladder cancer    Family history of colon cancer in father    Fatty liver    Fuchs' corneal dystrophy    GERD (gastroesophageal reflux disease)    Hearing loss    Right side 30%   Heartburn    History of hiatal hernia    small size   History of radiation therapy 08/21/17, 08/28/17,09/01/17, 09/11/17, 09/17/17   Vaginal cuff brachytherapy.    History of radiation therapy 11/11/17- 12/08/17   Right Breast treated to 40.05 Gy with 15 fx of 2.67 Gy and a boost of 10 Gy with 5 fx.    Hyperlipidemia    Hypertension    Hypothyroidism    IBS (irritable bowel syndrome)    hx of   Malignant neoplasm of upper-inner quadrant of right female breast ([place]) 04/2017   NAFL (nonalcoholic fatty liver)    Obesity    Osteoarthritis    Osteopenia    PONV (postoperative nausea and vomiting)    Tuberculosis    tested positive, mother had when patient was child   Uterine cancer ([place]) 04/2017  endometrial cancer   Varices, gastric    Vertigo     SURGICAL HISTORY: Past Surgical History:  Procedure Laterality Date   BREAST BIOPSY Right 04/27/2017   BREAST LUMPECTOMY WITH RADIOACTIVE SEED AND SENTINEL LYMPH NODE BIOPSY Right 05/25/2017   Procedure: RIGHT BREAST LUMPECTOMY WITH RADIOACTIVE SEED AND  RIGHT AXILLARY SENTINEL LYMPH NODE BIOPSY;  Surgeon: Excell Seltzer, MD;  Location: [facility];  Service: General;  Laterality: Right;   CATARACT EXTRACTION, BILATERAL     COLONOSCOPY     HYSTEROSCOPY WITH D & C N/A 04/29/2017   Procedure: DILATATION AND CURETTAGE /HYSTEROSCOPY;  Surgeon: Linda Hedges, DO;  Location: [facility];  Service: Gynecology;  Laterality: N/A;   PORT-A-CATH REMOVAL Left 04/16/2021   Procedure: REMOVAL PORT-A-CATH;  Surgeon: Johnathan Hausen, MD;  Location: [facility];  Service: General;  Laterality: Left;   PORTACATH PLACEMENT Left 05/25/2017   Procedure: INSERTION PORT-A-CATH;  Surgeon: Excell Seltzer, MD;  Location: [facility];  Service: General;  Laterality: Left;   ROBOTIC ASSISTED TOTAL HYSTERECTOMY WITH BILATERAL SALPINGO OOPHERECTOMY N/A 06/09/2017   Procedure: XI ROBOTIC ASSISTED TOTAL LAPOROSCOPIC HYSTERECTOMY WITH BILATERAL SALPINGO OOPHORECTOMY;  Surgeon: Everitt Amber, MD;  Location: [facility];  Service: Gynecology;  Laterality: N/A;   SENTINEL NODE BIOPSY N/A 06/09/2017   Procedure: SENTINEL NODE BIOPSY;  Surgeon: Everitt Amber, MD;  Location: [facility];  Service: Gynecology;  Laterality: N/A;    I have reviewed the social history and family history with the patient and they are unchanged from previous note.  ALLERGIES:  is allergic to ciprofloxacin, paclitaxel, crestor [rosuvastatin calcium], fosamax [alendronate sodium], livalo [pitavastatin], and penicillins.  MEDICATIONS:  Current Outpatient Medications  Medication Sig Dispense Refill   acetaminophen (TYLENOL) 500 MG tablet Take 500 mg by mouth every 6 (six) hours as needed for moderate pain.     Alpha-Lipoic Acid 600 MG CAPS Take 1 capsule (600 mg total) by mouth daily. 90 capsule 3   anastrozole (ARIMIDEX) 1 MG tablet TAKE 1 TABLET BY MOUTH  DAILY 90 tablet 3   Ascorbic Acid (VITAMIN C) 1000 MG tablet Take 1,000 mg by mouth daily.     atorvastatin (LIPITOR) 10 MG tablet TAKE 1 TABLET BY MOUTH  DAILY 90 tablet 3   b complex vitamins capsule Take 1 capsule by mouth daily.     Cholecalciferol (VITAMIN D3) 75 MCG (3000 UT) TABS Take 1 capsule by mouth daily.     fluticasone (FLONASE) 50 MCG/ACT nasal spray Place 2 sprays into both nostrils daily.     levothyroxine (SYNTHROID) 50 MCG tablet TAKE 1 TABLET BY MOUTH  DAILY 90 tablet 3   Multiple Vitamin (MULTIVITAMIN) capsule Take 1 capsule by mouth daily.     nystatin cream (MYCOSTATIN) Apply 1 application topically 2 (two) times daily for 2 weeks 30 g 0   olmesartan-hydrochlorothiazide (BENICAR HCT) 20-12.5 MG tablet TAKE 1 TABLET BY MOUTH  DAILY 90 tablet 3   pantoprazole (PROTONIX) 20 MG tablet  TAKE 1 TABLET BY MOUTH  DAILY 90 tablet 3   potassium chloride SA (KLOR-CON) 20 MEQ tablet TAKE 1 TABLET BY MOUTH  DAILY 90 tablet 3   pyridOXINE (VITAMIN B-6) 50 MG tablet Take 1 tablet (50 mg total) by mouth daily. 90 tablet 3   sodium chloride (MURO 128) 5 % ophthalmic solution Place 2 drops into both eyes daily.     No current facility-administered medications for this visit.    PHYSICAL EXAMINATION: ECOG PERFORMANCE STATUS: 1 -  Symptomatic but completely ambulatory  Vitals:   07/15/21 1054  BP: 124/75  Pulse: 77  Resp: 18  Temp: 98.3 F (36.8 C)  SpO2: 98%   Wt Readings from Last 3 Encounters:  07/15/21 178 lb 11.2 oz (81.1 kg)  07/08/21 178 lb (80.7 kg)  04/29/21 176 lb 12 oz (80.2 kg)     GENERAL:alert, no distress and comfortable SKIN: skin color, texture, turgor are normal, no rashes or significant lesions EYES: normal, Conjunctiva are pink and non-injected, sclera clear  NECK: supple, thyroid normal size, non-tender, without nodularity LYMPH:  no palpable lymphadenopathy in the cervical, axillary  Musculoskeletal:no cyanosis of digits and no clubbing  NEURO: alert & oriented x 3 with fluent speech, no focal motor/sensory deficits BREAST: 0.5 cm nodule 5 cmfn. No palpable mass or adenopathy bilaterally. Breast exam benign.   LABORATORY DATA:  I have reviewed the data as listed CBC Latest Ref Rng & Units 07/15/2021 04/29/2021 02/20/2021  WBC 4.0 - 10.5 K/uL 4.3 4.6 4.2  Hemoglobin 12.0 - 15.0 g/dL 14.1 14.6 14.0  Hematocrit 36.0 - 46.0 % 39.3 40.8 38.9  Platelets 150 - 400 K/uL 177 159 164     CMP Latest Ref Rng & Units 07/15/2021 04/29/2021 04/10/2021  Glucose 70 - 99 mg/dL 133(H) 115(H) 105(H)  BUN 8 - 23 mg/dL _0 Creatinine 0.44 - 1.00 mg/dL 0.82 0.88 0.72  Sodium 135 - 145 mmol/L 139 141 135  Potassium 3.5 - 5.1 mmol/L 3.6 3.6 3.8  Chloride 98 - 111 mmol/L 104 104 101  CO2 22 - 32 mmol/L _1 Calcium 8.9 - 10.3 mg/dL 9.4 9.8 9.5  Total Protein  6.5 - 8.1 g/dL 7.6 7.6 -  Total Bilirubin 0.3 - 1.2 mg/dL 0.7 0.6 -  Alkaline Phos 38 - 126 U/L 38 50 -  AST 15 - 41 U/L 21 19 -  ALT 0 - 44 U/L 31 29 -      RADIOGRAPHIC STUDIES: I have personally reviewed the radiological images as listed and agreed with the findings in the report. No results found.    Orders Placed This Encounter  Procedures   CT CHEST ABDOMEN PELVIS W CONTRAST    Standing Status:   Future    Standing Expiration Date:   07/15/2022    Order Specific Question:   Preferred imaging location?    Answer:   [facility]    Order Specific Question:   Is Oral Contrast requested for this exam?    Answer:   Yes, Per Radiology protocol   TSH    Please add to lab draw this morning    Standing Status:   Future    Standing Expiration Date:   07/15/2022   All questions were answered. The patient knows to call the clinic with any problems, questions or concerns. No barriers to learning was detected. The total time spent in the appointment was 30 minutes.     Truitt Merle, MD 07/15/2021   I, Wilburn Mylar, am acting as scribe for Truitt Merle, MD.   I have reviewed the above documentation for accuracy and completeness, and I agree with the above.

## 2021-07-17 ENCOUNTER — Other Ambulatory Visit (HOSPITAL_BASED_OUTPATIENT_CLINIC_OR_DEPARTMENT_OTHER): Payer: Self-pay

## 2021-07-31 ENCOUNTER — Other Ambulatory Visit: Payer: Self-pay

## 2021-07-31 ENCOUNTER — Ambulatory Visit (INDEPENDENT_AMBULATORY_CARE_PROVIDER_SITE_OTHER): Payer: Medicare Other | Admitting: Family Medicine

## 2021-07-31 ENCOUNTER — Encounter: Payer: Self-pay | Admitting: Family Medicine

## 2021-07-31 ENCOUNTER — Other Ambulatory Visit: Payer: Self-pay | Admitting: Family Medicine

## 2021-07-31 VITALS — BP 124/64 | HR 80 | Ht 63.0 in | Wt 179.0 lb

## 2021-07-31 DIAGNOSIS — B079 Viral wart, unspecified: Secondary | ICD-10-CM | POA: Diagnosis not present

## 2021-07-31 DIAGNOSIS — L989 Disorder of the skin and subcutaneous tissue, unspecified: Secondary | ICD-10-CM | POA: Diagnosis not present

## 2021-07-31 DIAGNOSIS — Z23 Encounter for immunization: Secondary | ICD-10-CM | POA: Diagnosis not present

## 2021-07-31 DIAGNOSIS — B078 Other viral warts: Secondary | ICD-10-CM | POA: Diagnosis not present

## 2021-07-31 NOTE — Progress Notes (Signed)
Established Patient Office Visit  Subjective:  Patient ID: Dawn [name], female    DOB: [birth year]  Age: 70 y.o. MRN: [id number]  CC: No chief complaint on file.   HPI Dawn [name] presents for lesion on posterior upper right arm.  Has not x6 months.  Sometimes it is just annoying and irritating she tries not to pick at it but sometimes does.   Past Surgical History:  Procedure Laterality Date   BREAST BIOPSY Right 04/27/2017   BREAST LUMPECTOMY WITH RADIOACTIVE SEED AND SENTINEL LYMPH NODE BIOPSY Right 05/25/2017   Procedure: RIGHT BREAST LUMPECTOMY WITH RADIOACTIVE SEED AND  RIGHT AXILLARY SENTINEL LYMPH NODE BIOPSY;  Surgeon: Glenna Fellows, MD;  Location: [facility];  Service: General;  Laterality: Right;   CATARACT EXTRACTION, BILATERAL     COLONOSCOPY     HYSTEROSCOPY WITH D & C N/A 04/29/2017   Procedure: DILATATION AND CURETTAGE /HYSTEROSCOPY;  Surgeon: Mitchel Honour, DO;  Location: [facility];  Service: Gynecology;  Laterality: N/A;   PORT-A-CATH REMOVAL Left 04/16/2021   Procedure: REMOVAL PORT-A-CATH;  Surgeon: Luretha Murphy, MD;  Location: [facility];  Service: General;  Laterality: Left;   PORTACATH PLACEMENT Left 05/25/2017   Procedure: INSERTION PORT-A-CATH;  Surgeon: Glenna Fellows, MD;  Location: [facility];  Service: General;  Laterality: Left;   ROBOTIC ASSISTED TOTAL HYSTERECTOMY WITH BILATERAL SALPINGO OOPHERECTOMY N/A 06/09/2017   Procedure: XI ROBOTIC ASSISTED TOTAL LAPOROSCOPIC HYSTERECTOMY WITH BILATERAL SALPINGO OOPHORECTOMY;  Surgeon: Adolphus Birchwood, MD;  Location: [facility];  Service: Gynecology;  Laterality: N/A;   SENTINEL NODE BIOPSY N/A 06/09/2017   Procedure: SENTINEL NODE BIOPSY;  Surgeon: Adolphus Birchwood, MD;  Location: [facility];  Service: Gynecology;  Laterality: N/A;    ROS Review of Systems    Objective:    Physical Exam  BP 124/64   Pulse 80   Ht 5\' 3"  (1.6 m)   Wt 179 lb (81.2 kg)   SpO2 99%    BMI 31.71 kg/m  Wt Readings from Last 3 Encounters:  07/31/21 179 lb (81.2 kg)  07/15/21 178 lb 11.2 oz (81.1 kg)  07/08/21 178 lb (80.7 kg)     Health Maintenance Due  Topic Date Due   Zoster Vaccines- Shingrix (1 of 2) Never done    There are no preventive care reminders to display for this patient.  Lab Results  Component Value Date   TSH 1.820 02/18/2021   Lab Results  Component Value Date   WBC 4.3 07/15/2021   HGB 14.1 07/15/2021   HCT 39.3 07/15/2021   MCV 94.9 07/15/2021   PLT 177 07/15/2021   Lab Results  Component Value Date   NA 139 07/15/2021   K 3.6 07/15/2021   CHLORIDE 101 09/02/2017   CO2 26 07/15/2021   GLUCOSE 133 (H) 07/15/2021   BUN 13 07/15/2021   CREATININE 0.82 07/15/2021   BILITOT 0.7 07/15/2021   ALKPHOS 38 07/15/2021   AST 21 07/15/2021   ALT 31 07/15/2021   PROT 7.6 07/15/2021   ALBUMIN 4.3 07/15/2021   CALCIUM 9.4 07/15/2021   ANIONGAP 9 07/15/2021   EGFR 77 02/18/2021   Lab Results  Component Value Date   CHOL 178 02/18/2021   Lab Results  Component Value Date   HDL 61 02/18/2021   Lab Results  Component Value Date   LDLCALC 97 02/18/2021   Lab Results  Component Value Date   TRIG 110 02/18/2021   Lab Results  Component  Value Date   CHOLHDL 2.9 02/18/2021   Lab Results  Component Value Date   HGBA1C 5.5 07/08/2021      Assessment & Plan:   Problem List Items Addressed This Visit   None Visit Diagnoses     Need for pneumococcal vaccination    -  Primary   Relevant Orders   Pneumococcal conjugate vaccine 20-valent (Prevnar 20) (Completed)   Skin lesion           No orders of the defined types were placed in this encounter.  Discussed procedure patient would like to have it removed.  Shave biopsy performed.  Patient tolerated well.  Follow-up wound care discussed.  Call if any concerns or problems.  Shave Biopsy Procedure Note  Pre-operative Diagnosis: Suspicious lesion  Post-operative Diagnosis:  same  Locations:right  upper outer arm   Indications: Irritation   Anesthesia: 1% lidocaine w/o epi  Procedure Details   Patient informed of the risks (including bleeding and infection) and benefits of the  procedure and Verbal informed consent obtained.  The lesion and surrounding area were given a sterile prep using chlorhexidine and draped in the usual sterile fashion. A scalpel was used to shave an area of skin approximately 82mm by 77mm.  Hemostasis achieved with alumuninum chloride and electrocautery. Antibiotic ointment and a sterile dressing applied.  The specimen was sent for pathologic examination. The patient tolerated the procedure well.  EBL: trace  Findings: Await pathology   Condition: Stable  Complications: none.  Plan: 1. Instructed to keep the wound dry and covered for 24-48h and clean thereafter. 2. Warning signs of infection were reviewed.   3. Recommended that the patient use OTC acetaminophen as needed for pain.  4. Return PRN.      Follow-up: No follow-ups on file.    Dawn Lecher, MD

## 2021-07-31 NOTE — Patient Instructions (Signed)
Keep bandage on until tomorrow AM.   Ok to wash with soap and water.  Do not scrub at the area. Avoid any alcohol, peroxide, iodine products. Apply Vaseline 2-3 times a day for 2 weeks.  Can keep loosely covered for the first couple of days.

## 2021-08-05 NOTE — Progress Notes (Signed)
Hi Dawn [name], just wanted to let you know that the biopsy showed an inflamed wart.  These are usually caused by viruses.  If you have any problems or concerns then please let us know.

## 2021-08-26 ENCOUNTER — Other Ambulatory Visit: Payer: Self-pay | Admitting: Hematology

## 2021-08-26 DIAGNOSIS — Z170 Estrogen receptor positive status [ER+]: Secondary | ICD-10-CM

## 2021-08-26 DIAGNOSIS — C50211 Malignant neoplasm of upper-inner quadrant of right female breast: Secondary | ICD-10-CM

## 2021-08-27 ENCOUNTER — Encounter: Payer: Self-pay | Admitting: Hematology

## 2021-09-02 ENCOUNTER — Other Ambulatory Visit: Payer: Self-pay | Admitting: Family Medicine

## 2021-09-06 ENCOUNTER — Other Ambulatory Visit: Payer: Self-pay | Admitting: Family Medicine

## 2021-09-06 DIAGNOSIS — I10 Essential (primary) hypertension: Secondary | ICD-10-CM

## 2021-09-10 ENCOUNTER — Other Ambulatory Visit: Payer: Self-pay | Admitting: Hematology

## 2021-09-13 ENCOUNTER — Other Ambulatory Visit: Payer: Self-pay | Admitting: Hematology

## 2021-09-13 ENCOUNTER — Other Ambulatory Visit: Payer: Self-pay | Admitting: Gastroenterology

## 2021-09-18 ENCOUNTER — Other Ambulatory Visit (HOSPITAL_COMMUNITY): Payer: Self-pay

## 2021-09-19 ENCOUNTER — Other Ambulatory Visit (HOSPITAL_COMMUNITY): Payer: Self-pay

## 2021-09-19 MED ORDER — FLUTICASONE PROPIONATE 50 MCG/ACT NA SUSP: NASAL | 3 refills | Status: DC

## 2021-10-28 ENCOUNTER — Other Ambulatory Visit: Payer: Medicare Other

## 2021-10-28 ENCOUNTER — Ambulatory Visit: Payer: Medicare Other

## 2021-11-04 ENCOUNTER — Encounter: Payer: Self-pay | Admitting: Hematology

## 2021-11-07 ENCOUNTER — Encounter: Payer: Self-pay | Admitting: Hematology

## 2021-11-08 ENCOUNTER — Other Ambulatory Visit: Payer: Self-pay

## 2021-11-08 DIAGNOSIS — C50211 Malignant neoplasm of upper-inner quadrant of right female breast: Secondary | ICD-10-CM

## 2021-11-08 DIAGNOSIS — Z170 Estrogen receptor positive status [ER+]: Secondary | ICD-10-CM

## 2021-11-11 ENCOUNTER — Other Ambulatory Visit: Payer: Self-pay

## 2021-11-11 ENCOUNTER — Ambulatory Visit (HOSPITAL_COMMUNITY)
Admission: RE | Admit: 2021-11-11 | Discharge: 2021-11-11 | Disposition: A | Discharge status: Home / Self Care | Payer: PPO | Source: Ambulatory Visit | Attending: Hematology | Admitting: Hematology

## 2021-11-11 ENCOUNTER — Other Ambulatory Visit (HOSPITAL_COMMUNITY): Payer: Self-pay

## 2021-11-11 ENCOUNTER — Inpatient Hospital Stay: Payer: PPO | Attending: Hematology

## 2021-11-11 ENCOUNTER — Encounter: Payer: Self-pay | Admitting: Hematology

## 2021-11-11 DIAGNOSIS — C50211 Malignant neoplasm of upper-inner quadrant of right female breast: Secondary | ICD-10-CM | POA: Insufficient documentation

## 2021-11-11 DIAGNOSIS — M810 Age-related osteoporosis without current pathological fracture: Secondary | ICD-10-CM

## 2021-11-11 DIAGNOSIS — M8580 Other specified disorders of bone density and structure, unspecified site: Secondary | ICD-10-CM | POA: Diagnosis not present

## 2021-11-11 DIAGNOSIS — E559 Vitamin D deficiency, unspecified: Secondary | ICD-10-CM | POA: Insufficient documentation

## 2021-11-11 DIAGNOSIS — C541 Malignant neoplasm of endometrium: Secondary | ICD-10-CM | POA: Insufficient documentation

## 2021-11-11 DIAGNOSIS — Z79899 Other long term (current) drug therapy: Secondary | ICD-10-CM | POA: Diagnosis not present

## 2021-11-11 DIAGNOSIS — Z170 Estrogen receptor positive status [ER+]: Secondary | ICD-10-CM | POA: Diagnosis not present

## 2021-11-11 DIAGNOSIS — Z853 Personal history of malignant neoplasm of breast: Secondary | ICD-10-CM | POA: Diagnosis not present

## 2021-11-11 DIAGNOSIS — I700 Atherosclerosis of aorta: Secondary | ICD-10-CM | POA: Diagnosis not present

## 2021-11-11 DIAGNOSIS — Z79811 Long term (current) use of aromatase inhibitors: Secondary | ICD-10-CM | POA: Insufficient documentation

## 2021-11-11 DIAGNOSIS — Z90710 Acquired absence of both cervix and uterus: Secondary | ICD-10-CM | POA: Diagnosis not present

## 2021-11-11 DIAGNOSIS — E039 Hypothyroidism, unspecified: Secondary | ICD-10-CM

## 2021-11-11 DIAGNOSIS — K5790 Diverticulosis of intestine, part unspecified, without perforation or abscess without bleeding: Secondary | ICD-10-CM | POA: Diagnosis not present

## 2021-11-11 LAB — CMP (CANCER CENTER ONLY)
ALT: 35 U/L (ref 0–44)
AST: 23 U/L (ref 15–41)
Albumin: 4.7 g/dL (ref 3.5–5.0)
Alkaline Phosphatase: 34 U/L — ABNORMAL LOW (ref 38–126)
Anion gap: 7 (ref 5–15)
BUN: 13 mg/dL (ref 8–23)
CO2: 30 mmol/L (ref 22–32)
Calcium: 10 mg/dL (ref 8.9–10.3)
Chloride: 101 mmol/L (ref 98–111)
Creatinine: 0.84 mg/dL (ref 0.44–1.00)
GFR, Estimated: >60 mL/min (ref >60)
Glucose, Bld: 117 mg/dL — ABNORMAL HIGH (ref 70–99)
Potassium: 3.7 mmol/L (ref 3.5–5.1)
Sodium: 138 mmol/L (ref 135–145)
Total Bilirubin: 0.9 mg/dL (ref 0.3–1.2)
Total Protein: 8.2 g/dL — ABNORMAL HIGH (ref 6.5–8.1)

## 2021-11-11 LAB — VITAMIN D 25 HYDROXY (VIT D DEFICIENCY, FRACTURES): Vit D, 25-Hydroxy: 30.78 ng/mL (ref 30–100)

## 2021-11-11 LAB — CBC WITH DIFFERENTIAL (CANCER CENTER ONLY)
Abs Immature Granulocytes: 0.01 10*3/uL (ref 0.00–0.07)
Basophils Absolute: 0 10*3/uL (ref 0.0–0.1)
Basophils Relative: 1 %
Eosinophils Absolute: 0.1 10*3/uL (ref 0.0–0.5)
Eosinophils Relative: 2 %
HCT: 44 % (ref 36.0–46.0)
Hemoglobin: 15.3 g/dL — ABNORMAL HIGH (ref 12.0–15.0)
Immature Granulocytes: 0 %
Lymphocytes Relative: 42 %
Lymphs Abs: 2.1 10*3/uL (ref 0.7–4.0)
MCH: 33.7 pg (ref 26.0–34.0)
MCHC: 34.8 g/dL (ref 30.0–36.0)
MCV: 96.9 fL (ref 80.0–100.0)
Monocytes Absolute: 0.5 10*3/uL (ref 0.1–1.0)
Monocytes Relative: 10 %
Neutro Abs: 2.3 10*3/uL (ref 1.7–7.7)
Neutrophils Relative %: 45 %
Platelet Count: 187 10*3/uL (ref 150–400)
RBC: 4.54 MIL/uL (ref 3.87–5.11)
RDW: 12.1 % (ref 11.5–15.5)
WBC Count: 5 10*3/uL (ref 4.0–10.5)
nRBC: 0 % (ref 0.0–0.2)

## 2021-11-11 LAB — TSH: TSH: 1.946 u[IU]/mL (ref 0.308–3.960)

## 2021-11-11 MED ORDER — IOHEXOL 300 MG/ML  SOLN
80.0000 mL | Freq: Once | INTRAMUSCULAR | Status: AC | PRN
  Administered 2021-11-11: 80 mL via INTRAVENOUS

## 2021-11-11 MED ORDER — SODIUM CHLORIDE (PF) 0.9 % IJ SOLN
INTRAMUSCULAR | Status: AC
  Filled 2021-11-11: qty 50

## 2021-11-11 MED FILL — Potassium Chloride Microencapsulated Crys ER Tab 20 mEq: ORAL | 90 days supply | Qty: 90 | Fill #0 | Status: AC

## 2021-11-13 ENCOUNTER — Other Ambulatory Visit: Payer: Self-pay

## 2021-11-13 ENCOUNTER — Inpatient Hospital Stay: Payer: PPO | Attending: Hematology

## 2021-11-13 ENCOUNTER — Inpatient Hospital Stay: Payer: PPO | Admitting: Hematology

## 2021-11-13 ENCOUNTER — Encounter: Payer: Self-pay | Admitting: Hematology

## 2021-11-13 VITALS — BP 138/81 | HR 73 | Temp 97.6°F | Resp 18 | Ht 63.0 in | Wt 184.3 lb

## 2021-11-13 DIAGNOSIS — E559 Vitamin D deficiency, unspecified: Secondary | ICD-10-CM | POA: Diagnosis not present

## 2021-11-13 DIAGNOSIS — Z95828 Presence of other vascular implants and grafts: Secondary | ICD-10-CM

## 2021-11-13 DIAGNOSIS — Z170 Estrogen receptor positive status [ER+]: Secondary | ICD-10-CM | POA: Insufficient documentation

## 2021-11-13 DIAGNOSIS — M8580 Other specified disorders of bone density and structure, unspecified site: Secondary | ICD-10-CM | POA: Insufficient documentation

## 2021-11-13 DIAGNOSIS — I10 Essential (primary) hypertension: Secondary | ICD-10-CM | POA: Insufficient documentation

## 2021-11-13 DIAGNOSIS — K219 Gastro-esophageal reflux disease without esophagitis: Secondary | ICD-10-CM | POA: Diagnosis not present

## 2021-11-13 DIAGNOSIS — C50211 Malignant neoplasm of upper-inner quadrant of right female breast: Secondary | ICD-10-CM | POA: Diagnosis not present

## 2021-11-13 DIAGNOSIS — Z79899 Other long term (current) drug therapy: Secondary | ICD-10-CM | POA: Insufficient documentation

## 2021-11-13 DIAGNOSIS — E7800 Pure hypercholesterolemia, unspecified: Secondary | ICD-10-CM | POA: Diagnosis not present

## 2021-11-13 DIAGNOSIS — C541 Malignant neoplasm of endometrium: Secondary | ICD-10-CM

## 2021-11-13 DIAGNOSIS — Z79811 Long term (current) use of aromatase inhibitors: Secondary | ICD-10-CM | POA: Diagnosis not present

## 2021-11-13 MED ORDER — ZOLEDRONIC ACID 4 MG/100ML IV SOLN
4.0000 mg | Freq: Once | INTRAVENOUS | Status: AC
  Administered 2021-11-13: 4 mg via INTRAVENOUS
  Filled 2021-11-13: qty 100

## 2021-11-13 MED ORDER — SODIUM CHLORIDE 0.9 % IV SOLN: Freq: Once | INTRAVENOUS | Status: AC

## 2021-11-13 NOTE — Progress Notes (Signed)
[place]   [phone] [phone]   Clinic Follow up Note   Patient Care Team: [name], MD as PCP - Haskell Riling, MD (Inactive) as Consulting Physician (General Surgery) Truitt Merle, MD as Consulting Physician (Hematology) [name], MD as Attending Physician (Radiation Oncology)  Date of Service:  11/13/2021  CHIEF COMPLAINT: f/u of breast and endometrial cancers  CURRENT THERAPY:  Letrozole 2.$RemoveBefo'5mg'mfPphVSeJXt$  daily starting 12/23/17. Switched to Anastrozole in 04/2018 due to joint pain Zometa q63months for 2 years starting 10/24/20  ASSESSMENT & PLAN:  Dawn [name] is a 71 y.o. female with   1. 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 Treated with right breast lumpectomy, adjuvant chemo and radiation. She tried Nerlynx but could not tolerate due to diarrhea, and does not want try again. -She started anti-estrogen therapy with letrozole in 12/2017. Due to joint pain I switched her to Anastrozole in 04/2018. She is tolerating well with manageable joint stiffness. Plan to continue for 7 years due to high risk disease.  -Last screening breast MRI in 11/2020 and MM on 05/08/21 were benign. I do not feel she needs to continue MRI annually. -She is clinically doing well. Labs from 11/11/21 reviewed, overall no concern. Physical exam is unremarkable. There is no clinical concern for recurrence. -Continue anastrozole  -f/u in 6 months.    2. Endometrial adenocarcinoma with lymph node metastasis, pT1apN1M0, FIGO stage IIIc, MSI-H, probable RP node recurrence in 12/2017  -Diagnosed in 04/2017. Treated with hysterectomy, BSO, adjuvant radiation, and chemo with Carbo/taxol. -genetics 04/2017 were negative with VUS in GATA2. -She had retroperitoneal lymph node metastasis in April 2019 and completed 2 years of Keytruda in May 2021. She has recovered well.   -CT CAP from 11/11/21 was NED, I reviewed with her today -may repeat last CT in a year    3. HTN, Acid reflux, High Cholesterol  -Well Controlled, Continue to f/u with PCP  -She was seen to have Gastritis on 11/22/19 upper endoscopy/colonoscopy. She is on PPI.     4. Osteopenia  -most recent DEXA scan in 07/2020 revealed T score of -2.4. -She was on monthly boniva, prescribed by her PCP, since 02/2018. I switched her to Zometa q62months for 2 years starting 10/24/20.  -Continue Vitamin D and Calcium supplements.  -vit D on 11/11/21 was on low end of normal, 30.78. Will check every 6 months.   5. Metabolic syndrome, hyperglycemia -She has no history of diabetes.  -She was recently found to have abnormal TSH levels. She has been seen by endocrinologist and has started levothyroxine on 01/04/19. Will monitor TSH level.  -Her weight has been stable in the 170's. -Continue to follow-up with weight management clinic    6. Arthritis, Neuropathy G2 -She has arthritis, mainly in her back. She will take Tylenol as needed.  -Managed by Sports Medicine Dr. Dianah Field -She was started on B6 and alphalogic acid supplement for her neuropathy. Stable.  -she declined to participate in [place]-2102 neuropathy study     Plan  -proceed with Zometa infusion today, she will restart calcium and increase vit D supplement  -continue anastrozole -lab, f/u, and Zometa infusion in 6 months   No problem-specific Assessment & Plan notes found for this encounter.   SUMMARY OF ONCOLOGIC HISTORY: Oncology History Overview Note  MSI high Cancer Staging Endometrial cancer [facility]) Staging form: Corpus Uteri - Adenosarcoma, AJCC 8th  Edition - Pathologic stage from 06/09/2017: Stage IIIC (pT1a, pN1, cM0) - Signed by Truitt Merle, MD on 06/16/2017  Malignant neoplasm of upper-inner quadrant of right breast in female, estrogen receptor positive ([place]) Staging form: Breast, AJCC 8th Edition - Clinical stage from 05/06/2017:  Stage IA (cT1c, cN0, cM0, G2, ER: Positive, PR: Negative, HER2: Positive) - Unsigned - Pathologic stage from 05/25/2017: Stage IIA (pT2, pN0, cM0, G2, ER: Positive, PR: Negative, HER2: Negative) - Signed by Truitt Merle, MD on 06/16/2017     Malignant neoplasm of upper-inner quadrant of right breast in female, estrogen receptor positive ([place])  04/27/2017 Initial Biopsy   Diagnosis Breast, right, needle core biopsy INVASIVE DUCTAL CARCINOMA, GRADE 2 Microscopic Comment The neoplasm has intracellular mucin and signet ring cell features. Immunostains shows these cells are positive for ER,GATA3, ck7 and GCDFP, negative for ck20, cdx2, TTF-1 and pax8, The immunostaining pattern supports the neoplasm is breast primary.   04/27/2017 Receptors her2   Estrogen Receptor: 70%, POSITIVE, MODERATE STAINING INTENSITY Progesterone Receptor: 0%, NEGATIVE Proliferation Marker Ki67: 30% HER2 - **POSITIVE** RATIO OF HER2/CEP17 SIGNALS 2.91 AVERAGE HER2 COPY NUMBER PER CELL 7.28   04/27/2017 Initial Diagnosis   Malignant neoplasm of upper-inner quadrant of right breast in female, estrogen receptor positive ([place])   05/07/2017 Imaging   CT Chest W Contrast 05/07/17 IMPRESSION: Tiny well-defined fatty lesion on the pleura at the right lung base. The thinner slice collimation used for today's chest CT eliminates volume-averaging seen in the lesion on the prior exam and confirms that this is a diffusely fatty nodule. This is a benign finding and likely represents a tiny lipoma. No defect in the hemidiaphragm evident to suggest tiny diaphragmatic hernia. Pulmonary hamartoma a consideration although the lack of soft tissue components makes this less likely.   05/15/2017 Genetic Testing   Patient had genetic testing due to a personal history of breast cancer and uterine cancer as well as a family history of cancer. The Multi-Cancer panel was ordered. The Multi-Cancer Panel offered by [facility] includes sequencing  and/or deletion duplication testing of the following 83 genes: ALK, APC, ATM, AXIN2,BAP1,  BARD1, BLM, BMPR1A, BRCA1, BRCA2, BRIP1, CASR, CDC73, CDH1, CDK4, CDKN1B, CDKN1C, CDKN2A (p14ARF), CDKN2A (p16INK4a), CEBPA, CHEK2, CTNNA1, DICER1, DIS3L2, EGFR (c.2369C>T, p.Thr790Met variant only), EPCAM (Deletion/duplication testing only), FH, FLCN, GATA2, GPC3, GREM1 (Promoter region deletion/duplication testing only), HOXB13 (c.251G>A, p.Gly84Glu), HRAS, KIT, MAX, MEN1, MET, MITF (c.952G>A, p.Glu318Lys variant only), MLH1, MSH2, MSH3, MSH6, MUTYH, NBN, NF1, NF2, NTHL1, PALB2, PDGFRA, PHOX2B, PMS2, POLD1, POLE, POT1, PRKAR1A, PTCH1, PTEN, RAD50, RAD51C, RAD51D, RB1, RECQL4, RET, RUNX1, SDHAF2, SDHA (sequence changes only), SDHB, SDHC, SDHD, SMAD4, SMARCA4, SMARCB1, SMARCE1, STK11, SUFU, TERC, TERT, TMEM127, TP53, TSC1, TSC2, VHL, WRN and WT1.    Results: No pathogenic mutations were identified. A VUS in the GATA2 gene c.1348G>A (p.Gly450Arg) was identified.  The date of this test report is 05/25/2017.     05/25/2017 Surgery   RIGHT BREAST LUMPECTOMY WITH RADIOACTIVE SEED AND  RIGHT AXILLARY SENTINEL LYMPH NODE BIOPSY and Pot placement by Dr. Excell Seltzer 05/25/17   05/25/2017 Pathology Results   Diagnosis 05/25/17 1. Breast, lumpectomy, right - INVASIVE DUCTAL CARCINOMA, GRADE II/III, SPANNING 2.2 CM. - DUCTAL CARCINOMA IN SITU, HIGH GRADE. - THE SURGICAL RESECTION MARGINS ARE NEGATIVE FOR CARCINOMA. - SEE ONCOLOGY TABLE BELOW. 2. Breast, excision, right additional superior margin - BENIGN FIBROADIPOSE TISSUE. - BENIGN SKELETAL MUSCLE. - SEE COMMENT. 3. Breast, excision, right additional lateral margin - BENIGN BREAST PARENCHYMA. - THERE IS NO  EVIDENCE OF MALIGNANCY. - SEE COMMENT. 4. Breast, excision, chest wall margin - BENIGN FIBROADIPOSE TISSUE. - THERE IS NO EVIDENCE OF MALIGNANCY. - SEE COMMENT. 5. Lymph node, sentinel, biopsy, right axillary - THERE IS NO EVIDENCE OF CARCINOMA IN 1 OF 1 LYMPH  NODE (0/1). 6. Lymph node, sentinel, biopsy, right axillary - THERE IS NO EVIDENCE OF CARCINOMA IN 1 OF 1 LYMPH NODE (0/1). 7. Lymph node, sentinel, biopsy, right axillary - THERE IS NO EVIDENCE OF CARCINOMA IN 1 OF 1 LYMPH NODE (0/1).   06/26/2017 PET scan   PET  IMPRESSION: 1. Postoperative findings both in the right breast and in the anatomic pelvis, with associated low-grade activity considered to be postoperative in nature. No hypermetabolic adenopathy or hypermetabolic lesions are identified to suggest active metastatic disease/malignancy. 2. Other imaging findings of potential clinical significance: Aortic Atherosclerosis (ICD10-I70.0). Sigmoid colon diverticulosis. Biapical pleuroparenchymal scarring in the lungs. Small amount of free pelvic fluid in the cul-de-sac, likely postoperative.     07/01/2017 - 10/14/2017 Chemotherapy   Adjuvant TCH with Onpro every 3 weeks for 6 cycles starting on 07/01/17, Changed Taxol to abraxane with cycle 4 due to drug rash reaction, followed by Herceptin every 3 weeks for 6 months    11/01/2017 - 12/08/2017 Radiation Therapy   Radiation therapy to her right breast with [name]   11/04/2017 - 06/2018 Chemotherapy   Maintenance Herceptin starting 11/04/17 and will comeplete her 12 months in 06/2018    12/23/2017 -  Anti-estrogen oral therapy   Adjuvant letrozole 2.5 mg daily started 12/23/17, switched to exemestane on 04/21/18 due to joint pain. Could not afford aromasin, started anastrozole 04/2018.    12/30/2017 Imaging   CT CAP W Contrast 12/30/17 IMPRESSION: Increased size of 1.1 cm aorto-caval retroperitoneal lymph node. This could be reactive due to interval hysterectomy, however metastatic carcinoma cannot be excluded. Consider continued attention on short-term follow-up CT, or PET-CT scan for further evaluation.   Mild hepatic steatosis.   01/25/2018 PET scan   IMPRESSION: 1. Enlarging hypermetabolic aortocaval lymph node is worrisome  for metastatic disease. 2. Probable postoperative seroma in the medial right breast, with associated mild inflammatory hypermetabolism.   04/28/2018 Mammogram   Probably benign. Calcifications in the right breast most likely are dystrophic, related to previous surgery, and are probably benign.    04/28/2018 Imaging   DEXA Scan T Score -2.0   06/10/2018 Echocardiogram   06/10/2018 ECHO LV EF: 55% -   60%   08/28/2018 - 08/2018 Chemotherapy   Neratinib $RemoveBe'4mg'rvJyKaXzO$  for 1 week then titrate up with 1 additional tablet weekly up to 12 mg starting 08/28/18. Stopped soon after starting due to diarrhea.   10/25/2018 Imaging   CT CAP 10/25/18  IMPRESSION: 1. No evidence of metastatic disease. 2.  Aortic atherosclerosis (ICD10-170.0).   06/15/2019 Pathology Results   Diagnosis Breast, right, needle core biopsy, 1 o'clock, 7cmfn - DENSE FIBROSIS CONSISTENT WITH PRIOR PROCEDURAL CHANGES. - NO MALIGNANCY IDENTIFIED.   10/24/2019 Imaging   CT CAP W Contrast  IMPRESSION: 1. Stable exam. No evidence of recurrent or metastatic carcinoma within the chest, abdomen, or pelvis. 2. Colonic diverticulosis, without radiographic evidence of diverticulitis.   Aortic Atherosclerosis (ICD10-I70.0).   12/05/2020 Imaging   MRI Breast  IMPRESSION: No suspicious areas of enhancement identified within the right or left breast to suggest malignancy.   Endometrial adenocarcinoma ([place])  05/07/2017 Initial Diagnosis   Endometrial adenocarcinoma ([place])   06/09/2017 Surgery   XI ROBOTIC ASSISTED TOTAL LAPOROSCOPIC HYSTERECTOMY  WITH BILATERAL SALPINGO OOPHORECTOMY and SENTINEL NODE BIOPSY by Dr. Andrey Farmer 06/09/17   06/09/2017 Pathology Results   Diagnosis 06/09/17 1. Lymph node, sentinel, biopsy, right external iliac - METASTATIC ADENOCARCINOMA IN ONE LYMPH NODE (1/1). 2. Lymph node, sentinel, biopsy, left obturator - ONE BENIGN LYMPH NODE (0/1). 3. Lymph node, sentinel, biopsy, left external iliac - METASTATIC  ADENOCARCINOMA IN ONE LYMPH NODE (1/1). 4. Uterus +/- tubes/ovaries, neoplastic ENDOMETRIUM: - ENDOMETRIAL ADENOCARCINOMA, 3.2 CM. - CARCINOMA INVADES INNER HALF OF MYOMETRIUM. 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 - CERVIX, BILATERAL FALLOPIAN TUBES AND BILATERAL OVARIES FREE OF TUMOR   08/18/2017 - 09/17/2017 Radiation Therapy   vaginal brachytherapy per Dr. Basilio Cairo on starting 08/18/17    Genetic Testing   Patient has genetic testing done for MSI. 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   02/17/2018 - 02/08/2020 Antibody Plan   Keytruda every 3 weeks starting 02/17/18. Completed 2 years on 02/08/20   04/11/2019 Imaging   CT AP W Contrast  IMPRESSION: 1. No evidence of recurrent or metastatic carcinoma within the abdomen or pelvis. 2. Colonic diverticulosis. No radiographic evidence of diverticulitis.     04/23/2020 Imaging   CT AP W contrast  IMPRESSION: 1. Stable exam. Specifically, no findings to suggest recurrent or metastatic disease. 2. Left colonic diverticulosis without diverticulitis. 3. Aortic Atherosclerosis (ICD10-I70.0).     10/22/2020 Imaging   CT CAP  IMPRESSION: 1. No evidence of metastatic disease in the chest, abdomen or pelvis. 2. Small hiatal hernia. 3. Mild sigmoid diverticulosis. 4. Aortic Atherosclerosis (ICD10-I70.0).        INTERVAL HISTORY:  Dawn [name] is here for a follow up of breast and endometrial cancers. She was last seen by me on 07/15/21. She presents to the clinic alone. She reports a new bothersome bump to the area behind her right ear. She notes it sometimes becomes painful at night. She notes chronic hearing loss in her right ear.   All other systems were reviewed with the patient and are negative.  MEDICAL HISTORY:  Past Medical History:  Diagnosis Date   Anemia    as a child.   Arthritis    Atypical mole 06/04/2018   moderate top of right foot   Atypical mole 06/04/2018   left elbow mild    Atypical mole 06/04/2018   left upper back mild   Bilateral cataracts    Bilateral leg cramps    Cancer (HCC)    skin   Dyspnea    Endometrial cancer (HCC)    Family history of bladder cancer    Family history of colon cancer in father    Fatty liver    Fuchs' corneal dystrophy    GERD (gastroesophageal reflux disease)    Hearing loss    Right side 30%   Heartburn    History of hiatal hernia    small size   History of radiation therapy 08/21/17, 08/28/17,09/01/17, 09/11/17, 09/17/17   Vaginal cuff brachytherapy.    History of radiation therapy 11/11/17- 12/08/17   Right Breast treated to 40.05 Gy with 15 fx of 2.67 Gy and a boost of 10 Gy with 5 fx.    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   NAFL (nonalcoholic fatty liver)    Obesity    Osteoarthritis    Osteopenia  PONV (postoperative nausea and vomiting)    Tuberculosis    tested positive, mother had when patient was child   Uterine cancer ([place]) 04/2017   endometrial cancer   Varices, gastric    Vertigo     SURGICAL HISTORY: Past Surgical History:  Procedure Laterality Date   BREAST BIOPSY Right 04/27/2017   BREAST LUMPECTOMY WITH RADIOACTIVE SEED AND SENTINEL LYMPH NODE BIOPSY Right 05/25/2017   Procedure: RIGHT BREAST LUMPECTOMY WITH RADIOACTIVE SEED AND  RIGHT AXILLARY SENTINEL LYMPH NODE BIOPSY;  Surgeon: Excell Seltzer, MD;  Location: [place];  Service: General;  Laterality: Right;   CATARACT EXTRACTION, BILATERAL     COLONOSCOPY     HYSTEROSCOPY WITH D & C N/A 04/29/2017   Procedure: DILATATION AND CURETTAGE /HYSTEROSCOPY;  Surgeon: Linda Hedges, DO;  Location: [facility];  Service: Gynecology;  Laterality: N/A;   PORT-A-CATH REMOVAL Left 04/16/2021   Procedure: REMOVAL PORT-A-CATH;  Surgeon: Johnathan Hausen, MD;  Location: [place];   Service: General;  Laterality: Left;   PORTACATH PLACEMENT Left 05/25/2017   Procedure: INSERTION PORT-A-CATH;  Surgeon: Excell Seltzer, MD;  Location: [place] [place];  Service: General;  Laterality: Left;   ROBOTIC ASSISTED TOTAL HYSTERECTOMY WITH BILATERAL SALPINGO OOPHERECTOMY N/A 06/09/2017   Procedure: XI ROBOTIC ASSISTED TOTAL LAPOROSCOPIC HYSTERECTOMY WITH BILATERAL SALPINGO OOPHORECTOMY;  Surgeon: Everitt Amber, MD;  Location: [facility];  Service: Gynecology;  Laterality: N/A;   SENTINEL NODE BIOPSY N/A 06/09/2017   Procedure: SENTINEL NODE BIOPSY;  Surgeon: Everitt Amber, MD;  Location: [facility];  Service: Gynecology;  Laterality: N/A;    I have reviewed the social history and family history with the patient and they are unchanged from previous note.  ALLERGIES:  is allergic to ciprofloxacin, paclitaxel, crestor [rosuvastatin calcium], fosamax [alendronate sodium], livalo [pitavastatin], and penicillins.  MEDICATIONS:  Current Outpatient Medications  Medication Sig Dispense Refill   acetaminophen (TYLENOL) 500 MG tablet Take 500 mg by mouth every 6 (six) hours as needed for moderate pain.     Alpha-Lipoic Acid 600 MG CAPS Take 1 capsule (600 mg total) by mouth daily. 90 capsule 3   anastrozole (ARIMIDEX) 1 MG tablet TAKE 1 TABLET BY MOUTH  DAILY 90 tablet 3   Ascorbic Acid (VITAMIN C) 1000 MG tablet Take 1,000 mg by mouth daily.     atorvastatin (LIPITOR) 10 MG tablet TAKE 1 TABLET BY MOUTH  DAILY 90 tablet 3   b complex vitamins capsule Take 1 capsule by mouth daily.     Cholecalciferol (VITAMIN D3) 75 MCG (3000 UT) TABS Take 1 capsule by mouth daily.     fluticasone (FLONASE) 50 MCG/ACT nasal spray Place 2 sprays into both nostrils daily.     fluticasone (FLONASE) 50 MCG/ACT nasal spray Use 2 sprays into both nostrils daily 48 g 3   levothyroxine (SYNTHROID) 50 MCG tablet TAKE 1 TABLET BY MOUTH  DAILY 90 tablet 1   Multiple Vitamin (MULTIVITAMIN) capsule Take 1 capsule by  mouth daily.     nystatin cream (MYCOSTATIN) Apply 1 application topically 2 (two) times daily for 2 weeks 30 g 0   olmesartan-hydrochlorothiazide (BENICAR HCT) 20-12.5 MG tablet TAKE 1 TABLET BY MOUTH  DAILY 90 tablet 1   pantoprazole (PROTONIX) 20 MG tablet TAKE 1 TABLET BY MOUTH  DAILY 90 tablet 1   potassium chloride SA (KLOR-CON M) 20 MEQ tablet TAKE 1 TABLET BY MOUTH  DAILY 90 tablet 3   pyridOXINE (VITAMIN B-6) 50 MG tablet Take 1  tablet (50 mg total) by mouth daily. 90 tablet 3   sodium chloride (MURO 128) 5 % ophthalmic solution Place 2 drops into both eyes daily.     No current facility-administered medications for this visit.    PHYSICAL EXAMINATION: ECOG PERFORMANCE STATUS: 0 - Asymptomatic  Vitals:   11/13/21 0949  BP: 138/81  Pulse: 73  Resp: 18  Temp: 97.6 F (36.4 C)  SpO2: 97%   Wt Readings from Last 3 Encounters:  11/13/21 184 lb 4.8 oz (83.6 kg)  07/31/21 179 lb (81.2 kg)  07/15/21 178 lb 11.2 oz (81.1 kg)     GENERAL:alert, no distress and comfortable SKIN: skin color, texture, turgor are normal, no rashes or significant lesions EYES: normal, Conjunctiva are pink and non-injected, sclera clear  NECK: supple, thyroid normal size, non-tender, without nodularity LYMPH:  no palpable lymphadenopathy in the cervical, axillary  ABDOMEN:abdomen soft, non-tender and no rmal bowel sounds Musculoskeletal:no cyanosis of digits and no clubbing  NEURO: alert & oriented x 3 with fluent speech, no focal motor/sensory deficits BREAST: No palpable mass, nodules or adenopathy bilaterally. Breast exam benign.   LABORATORY DATA:  I have reviewed the data as listed CBC Latest Ref Rng & Units 11/11/2021 07/15/2021 04/29/2021  WBC 4.0 - 10.5 K/uL 5.0 4.3 4.6  Hemoglobin 12.0 - 15.0 g/dL 15.3(H) 14.1 14.6  Hematocrit 36.0 - 46.0 % 44.0 39.3 40.8  Platelets 150 - 400 K/uL 187 177 159     CMP Latest Ref Rng & Units 11/11/2021 07/15/2021 04/29/2021  Glucose 70 - 99 mg/dL 117(H)  133(H) 115(H)  BUN 8 - 23 mg/dL $Remove'13 13 16  'XAVnWtg$ Creatinine 0.44 - 1.00 mg/dL 0.84 0.82 0.88  Sodium 135 - 145 mmol/L 138 139 141  Potassium 3.5 - 5.1 mmol/L 3.7 3.6 3.6  Chloride 98 - 111 mmol/L 101 104 104  CO2 22 - 32 mmol/L $RemoveB'30 26 25  'ciqjTfac$ Calcium 8.9 - 10.3 mg/dL 10.0 9.4 9.8  Total Protein 6.5 - 8.1 g/dL 8.2(H) 7.6 7.6  Total Bilirubin 0.3 - 1.2 mg/dL 0.9 0.7 0.6  Alkaline Phos 38 - 126 U/L 34(L) 38 50  AST 15 - 41 U/L $Remo'23 21 19  'ssJYl$ ALT 0 - 44 U/L 35 31 29      RADIOGRAPHIC STUDIES: I have personally reviewed the radiological images as listed and agreed with the findings in the report. No results found.    No orders of the defined types were placed in this encounter.  All questions were answered. The patient knows to call the clinic with any problems, questions or concerns. No barriers to learning was detected. The total time spent in the appointment was 30 minutes.     Truitt Merle, MD 11/13/2021   I, Wilburn Mylar, am acting as scribe for Truitt Merle, MD.   I have reviewed the above documentation for accuracy and completeness, and I agree with the above.

## 2021-11-13 NOTE — Patient Instructions (Signed)

## 2021-11-18 ENCOUNTER — Other Ambulatory Visit: Payer: Self-pay

## 2021-11-20 ENCOUNTER — Ambulatory Visit (INDEPENDENT_AMBULATORY_CARE_PROVIDER_SITE_OTHER): Payer: PPO | Admitting: Family Medicine

## 2021-11-20 DIAGNOSIS — Z0000 Encounter for general adult medical examination without abnormal findings: Secondary | ICD-10-CM

## 2021-11-20 NOTE — Patient Instructions (Addendum)
[facility] Maintenance Summary and Written Plan of Care  [name] ,  Thank you for allowing me to perform your Medicare Annual Wellness Visit and for your ongoing commitment to your health.   Health Maintenance & Immunization History Health Maintenance  Topic Date Due   Zoster Vaccines- Shingrix (1 of 2) 02/20/2022 (Originally 01/17/1970)   MAMMOGRAM  05/09/2023   DEXA SCAN  08/02/2023   COLONOSCOPY (Pts 45-35yr Insurance coverage will need to be confirmed)  11/21/2024   TETANUS/TDAP  03/24/2029   Pneumonia Vaccine 70 Years old  Completed   INFLUENZA VACCINE  Completed   COVID-19 Vaccine  Completed   Hepatitis C Screening  Completed   HPV VACCINES  Aged Out   Immunization History  Administered Date(s) Administered   Fluad Quad(high Dose 65+) 06/15/2019, 06/20/2020, 07/08/2021   Influenza Split 06/15/2012   Influenza Whole 06/26/2009, 05/18/2013   Influenza,inj,Quad PF,6+ Mos 06/02/2014, 06/04/2016, 06/02/2018   Influenza-Unspecified 06/15/2017   PFIZER(Purple Top)SARS-COV-2 Vaccination 09/20/2019, 10/11/2019, 06/12/2020   PNEUMOCOCCAL CONJUGATE-20 07/31/2021   Pfizer Covid-19 Vaccine Bivalent Booster 183yr& up 06/12/2021   Pneumococcal Conjugate-13 06/29/2017   Td 12/14/2008   Tdap 03/25/2019   Zoster, Live 04/04/2011    These are the patient goals that we discussed:  Goals Addressed              This Visit's Progress     Patient Stated (pt-stated)        11/20/2021 AWV Goal: Improved Nutrition/Diet  Patient will verbalize understanding that diet plays an important role in overall health and that a poor diet is a risk factor for many chronic medical conditions.  Over the next year, patient will improve self management of their diet by incorporating better food choices. Patient will utilize available community resources to help with food acquisition if needed (ex: food pantries, [address], etc) Patient will work  with nutrition specialist if a referral was made         This is a list of Health Maintenance Items that are overdue or due now: Shingrix    Orders/Referrals Placed Today: No orders of the defined types were placed in this encounter.  (Contact our referral department at [phone] you have not spoken with someone about your referral appointment within the next 5 days)    Follow-up Plan Follow-up with MeHali MarryMD as planned Schedule your Shingrix at your pharmacy. Medicare wellness visit in one year. Patient will access AVS on my chart.      Health Maintenance, Female Adopting a healthy lifestyle and getting preventive care are important in promoting health and wellness. Ask your health care provider about: The right schedule for you to have regular tests and exams. Things you can do on your own to prevent diseases and keep yourself healthy. What should I know about diet, weight, and exercise? Eat a healthy diet  Eat a diet that includes plenty of vegetables, fruits, low-fat dairy products, and lean protein. Do not eat a lot of foods that are high in solid fats, added sugars, or sodium. Maintain a healthy weight Body mass index (BMI) is used to identify weight problems. It estimates body fat based on height and weight. Your health care provider can help determine your BMI and help you achieve or maintain a healthy weight. Get regular exercise Get regular exercise. This is one of the most important things you can do for your health. Most adults should: Exercise for at least 150 minutes each  week. The exercise should increase your heart rate and make you sweat (moderate-intensity exercise). Do strengthening exercises at least twice a week. This is in addition to the moderate-intensity exercise. Spend less time sitting. Even light physical activity can be beneficial. Watch cholesterol and blood lipids Have your blood tested for lipids and cholesterol at 70 years  of age, then have this test every 5 years. Have your cholesterol levels checked more often if: Your lipid or cholesterol levels are high. You are older than 70 years of age. You are at high risk for heart disease. What should I know about cancer screening? Depending on your health history and family history, you may need to have cancer screening at various ages. This may include screening for: Breast cancer. Cervical cancer. Colorectal cancer. Skin cancer. Lung cancer. What should I know about heart disease, diabetes, and high blood pressure? Blood pressure and heart disease High blood pressure causes heart disease and increases the risk of stroke. This is more likely to develop in people who have high blood pressure readings or are overweight. Have your blood pressure checked: Every 3-5 years if you are 36-32 years of age. Every year if you are 48 years old or older. Diabetes Have regular diabetes screenings. This checks your fasting blood sugar level. Have the screening done: Once every three years after age 48 if you are at a normal weight and have a low risk for diabetes. More often and at a younger age if you are overweight or have a high risk for diabetes. What should I know about preventing infection? Hepatitis B If you have a higher risk for hepatitis B, you should be screened for this virus. Talk with your health care provider to find out if you are at risk for hepatitis B infection. Hepatitis C Testing is recommended for: Everyone born from [birth year] through 1964. Anyone with known risk factors for hepatitis C. Sexually transmitted infections (STIs) Get screened for STIs, including gonorrhea and chlamydia, if: You are sexually active and are younger than 70 years of age. You are older than 70 years of age and your health care provider tells you that you are at risk for this type of infection. Your sexual activity has changed since you were last screened, and you are at increased  risk for chlamydia or gonorrhea. Ask your health care provider if you are at risk. Ask your health care provider about whether you are at high risk for HIV. Your health care provider may recommend a prescription medicine to help prevent HIV infection. If you choose to take medicine to prevent HIV, you should first get tested for HIV. You should then be tested every 3 months for as long as you are taking the medicine. Pregnancy If you are about to stop having your period (premenopausal) and you may become pregnant, seek counseling before you get pregnant. Take 400 to 800 micrograms (mcg) of folic acid every day if you become pregnant. Ask for birth control (contraception) if you want to prevent pregnancy. Osteoporosis and menopause Osteoporosis is a disease in which the bones lose minerals and strength with aging. This can result in bone fractures. If you are 13 years old or older, or if you are at risk for osteoporosis and fractures, ask your health care provider if you should: Be screened for bone loss. Take a calcium or vitamin D supplement to lower your risk of fractures. Be given hormone replacement therapy (HRT) to treat symptoms of menopause. Follow these instructions at home:  Alcohol use Do not drink alcohol if: Your health care provider tells you not to drink. You are pregnant, may be pregnant, or are planning to become pregnant. If you drink alcohol: Limit how much you have to: 0-1 drink a day. Know how much alcohol is in your drink. In the [place] one drink equals one 12 oz bottle of beer (355 mL), one 5 oz glass of wine (148 mL), or one 1 oz glass of hard liquor (44 mL). Lifestyle Do not use any products that contain nicotine or tobacco. These products include cigarettes, chewing tobacco, and vaping devices, such as e-cigarettes. If you need help quitting, ask your health care provider. Do not use street drugs. Do not share needles. Ask your health care provider for help if you need  support or information about quitting drugs. General instructions Schedule regular health, dental, and eye exams. Stay current with your vaccines. Tell your health care provider if: You often feel depressed. You have ever been abused or do not feel safe at home. Summary Adopting a healthy lifestyle and getting preventive care are important in promoting health and wellness. Follow your health care provider's instructions about healthy diet, exercising, and getting tested or screened for diseases. Follow your health care provider's instructions on monitoring your cholesterol and blood pressure. This information is not intended to replace advice given to you by your health care provider. Make sure you discuss any questions you have with your health care provider. Document Revised: 01/21/2021 Document Reviewed: 01/21/2021 Elsevier Patient Education  [place].

## 2021-11-20 NOTE — Progress Notes (Signed)
MEDICARE ANNUAL WELLNESS VISIT  11/20/2021  Telephone Visit Disclaimer This Medicare AWV was conducted by telephone due to national recommendations for restrictions regarding the COVID-19 Pandemic (e.g. social distancing).  I verified, using two identifiers, that I am speaking with Dawn [name] or their authorized healthcare agent. I discussed the limitations, risks, security, and privacy concerns of performing an evaluation and management service by telephone and the potential availability of an in-person appointment in the future. The patient expressed understanding and agreed to proceed.  Location of Patient: Home Location of Provider (nurse):  Provider home  Subjective:    Dawn [name] is a 71 y.o. female patient of Metheney, Rene Kocher, MD who had a Medicare Annual Wellness Visit today via telephone. Dawn [name] is Retired and lives with their spouse. she has 2 children. she reports that she is socially active and does interact with friends/family regularly. she is moderately physically active and enjoys walking and hiking.  Patient Care Team: [name], MD as PCP - Haskell Riling, MD (Inactive) as Consulting Physician (General Surgery) Truitt Merle, MD as Consulting Physician (Hematology) [name], MD as Attending Physician (Radiation Oncology)  Advanced Directives 11/20/2021 11/13/2021 04/16/2021 04/08/2021 10/24/2020 02/08/2020 01/18/2020  Does Patient Have a Medical Advance Directive? Yes Yes Yes Yes Yes Yes Yes  Type of Advance Directive Living will;Healthcare Power of Attorney - Living will Living will [place];Living will - [place];Living will  Does patient want to make changes to medical advance directive? No - Patient declined No - Patient declined No - Patient declined - No - Patient declined No - Patient declined -  Copy of [place] in Chart? No - copy requested - No - copy requested No - copy  requested Yes - validated most recent copy scanned in chart (See row information) [facility] Utilization Over the Past 12 Months: # of hospitalizations or ER visits: 0 # of surgeries: 0  Review of Systems    Patient reports that her overall health is unchanged compared to last year.  History obtained from chart review and the patient  Patient Reported Readings (BP, Pulse, CBG, Weight, etc) none  Pain Assessment Pain : No/denies pain     Current Medications & Allergies (verified) Allergies as of 11/20/2021       Reactions   Ciprofloxacin Hives   Paclitaxel Rash   Crestor [rosuvastatin Calcium] Other (See Comments)   Myalgias   Fosamax [alendronate Sodium] Other (See Comments)   reflux   Livalo [pitavastatin] Other (See Comments)   Difficulty walking. Weakness in legs.    Penicillins Rash   Has patient had a PCN reaction causing immediate rash, facial/tongue/throat swelling, SOB or lightheadedness with hypotension: Yes Has patient had a PCN reaction causing severe rash involving mucus membranes or skin necrosis: Yes Has patient had a PCN reaction that required hospitalization: No Has patient had a PCN reaction occurring within the last 10 years: No If all of the above answers are "NO", then may proceed with Cephalosporin use.        Medication List        Accurate as of November 20, 2021  2:19 PM. If you have any questions, ask your nurse or doctor.          acetaminophen 500 MG tablet Commonly known as: TYLENOL Take 500 mg by mouth every 6 (six) hours as needed for moderate pain.   Alpha-Lipoic Acid 600 MG  Caps Take 1 capsule (600 mg total) by mouth daily.   anastrozole 1 MG tablet Commonly known as: ARIMIDEX TAKE 1 TABLET BY MOUTH  DAILY   atorvastatin 10 MG tablet Commonly known as: LIPITOR TAKE 1 TABLET BY MOUTH  DAILY   b complex vitamins capsule Take 1 capsule by mouth daily.   fluticasone 50 MCG/ACT nasal spray Commonly known as:  FLONASE Place 2 sprays into both nostrils daily.   fluticasone 50 MCG/ACT nasal spray Commonly known as: FLONASE Use 2 sprays into both nostrils daily   levothyroxine 50 MCG tablet Commonly known as: SYNTHROID TAKE 1 TABLET BY MOUTH  DAILY   multivitamin capsule Take 1 capsule by mouth daily.   nystatin cream Commonly known as: MYCOSTATIN Apply 1 application topically 2 (two) times daily for 2 weeks   olmesartan-hydrochlorothiazide 20-12.5 MG tablet Commonly known as: BENICAR HCT TAKE 1 TABLET BY MOUTH  DAILY   pantoprazole 20 MG tablet Commonly known as: PROTONIX TAKE 1 TABLET BY MOUTH  DAILY   potassium chloride SA 20 MEQ tablet Commonly known as: KLOR-CON M TAKE 1 TABLET BY MOUTH  DAILY   pyridOXINE 50 MG tablet Commonly known as: VITAMIN B-6 Take 1 tablet (50 mg total) by mouth daily.   sodium chloride 5 % ophthalmic solution Commonly known as: MURO 128 Place 2 drops into both eyes daily.   vitamin C 1000 MG tablet Take 1,000 mg by mouth daily.   Vitamin D3 75 MCG (3000 UT) Tabs Take 1 capsule by mouth daily.        History (reviewed): Past Medical History:  Diagnosis Date   Anemia    as a child.   Arthritis    Atypical mole 06/04/2018   moderate top of right foot   Atypical mole 06/04/2018   left elbow mild   Atypical mole 06/04/2018   left upper back mild   Bilateral cataracts    Bilateral leg cramps    Cancer ([place])    skin   Dyspnea    Endometrial cancer ([place])    Family history of bladder cancer    Family history of colon cancer in father    Fatty liver    Fuchs' corneal dystrophy    GERD (gastroesophageal reflux disease)    Hearing loss    Right side 30%   Heartburn    History of hiatal hernia    small size   History of radiation therapy 08/21/17, 08/28/17,09/01/17, 09/11/17, 09/17/17   Vaginal cuff brachytherapy.    History of radiation therapy 11/11/17- 12/08/17   Right Breast treated to 40.05 Gy with 15 fx of 2.67 Gy and a boost of  10 Gy with 5 fx.    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, mother had when patient was child   Uterine cancer ([place]) 04/2017   endometrial cancer   Varices, gastric    Vertigo    Past Surgical History:  Procedure Laterality Date   BREAST BIOPSY Right 04/27/2017   BREAST LUMPECTOMY WITH RADIOACTIVE SEED AND SENTINEL LYMPH NODE BIOPSY Right 05/25/2017   Procedure: RIGHT BREAST LUMPECTOMY WITH RADIOACTIVE SEED AND  RIGHT AXILLARY  SENTINEL LYMPH NODE BIOPSY;  Surgeon: Excell Seltzer, MD;  Location: [facility];  Service: General;  Laterality: Right;   CATARACT EXTRACTION, BILATERAL     COLONOSCOPY     HYSTEROSCOPY WITH D & C N/A 04/29/2017   Procedure: DILATATION AND CURETTAGE /HYSTEROSCOPY;  Surgeon: Linda Hedges, DO;  Location: [facility];  Service: Gynecology;  Laterality: N/A;   PORT-A-CATH REMOVAL Left 04/16/2021   Procedure: REMOVAL PORT-A-CATH;  Surgeon: Johnathan Hausen, MD;  Location: [facility];  Service: General;  Laterality: Left;   PORTACATH PLACEMENT Left 05/25/2017   Procedure: INSERTION PORT-A-CATH;  Surgeon: Excell Seltzer, MD;  Location: [facility];  Service: General;  Laterality: Left;   ROBOTIC ASSISTED TOTAL HYSTERECTOMY WITH BILATERAL SALPINGO OOPHERECTOMY N/A 06/09/2017   Procedure: XI ROBOTIC ASSISTED TOTAL LAPOROSCOPIC HYSTERECTOMY WITH BILATERAL SALPINGO OOPHORECTOMY;  Surgeon: Everitt Amber, MD;  Location: [facility];  Service: Gynecology;  Laterality: N/A;   SENTINEL NODE BIOPSY N/A 06/09/2017   Procedure: SENTINEL NODE BIOPSY;  Surgeon: Everitt Amber, MD;  Location: [facility];  Service: Gynecology;  Laterality: N/A;   Family History   Problem Relation Age of Onset   Colon cancer Father 75   Heart attack Father 18   Prostate cancer Father        dx 81's   Bladder Cancer Father 33   Hypertension Father    Hyperlipidemia Father    Heart disease Father    Obesity Father    Stroke Mother 66   Depression Mother    Anxiety disorder Mother    Bipolar disorder Mother    Obesity Mother    Diabetes Maternal Grandfather    Kidney disease Maternal Grandfather    Asthma Brother    Cancer Paternal Grandmother 19       GYN cancer ( thinks ovarian, maybe uterine)   Heart attack Maternal Grandmother 70   Breast cancer Other 70   Colon cancer Other 89   Cancer Other        type unk, age dx unk   Social History   Socioeconomic History   Marital status: Married    Spouse name: Simona Huh   Number of children: 2   Years of education: 16   Highest education level: Bachelor's degree (e.g., BA, AB, BS)  Occupational History    Employer: [employer]    Comment: [employer]   Tobacco Use   Smoking status: Never   Smokeless tobacco: Never  Vaping Use   Vaping Use: Never used  Substance and Sexual Activity   Alcohol use: Not Currently    Alcohol/week: 0.0 standard drinks    Comment: <1/week wine or beer occasional   Drug use: No   Sexual activity: Yes    Birth control/protection: Post-menopausal  Other Topics Concern   Not on file  Social History Narrative   Lives with her husband. Still works part time. She enjoys hiking, walking and gardening.     Social Determinants of Health   Financial Resource Strain: Low Risk    Difficulty of Paying Living Expenses: Not hard at all  Food Insecurity: No Food Insecurity   Worried About Charity fundraiser in the Last Year: Never true   [place] in the Last Year: Never true  Transportation Needs: No Transportation Needs   Lack of Transportation (Medical): No   Lack of Transportation (Non-Medical): No  Physical Activity: Sufficiently Active    Days of Exercise per Week: 5 days  Minutes of Exercise per Session: 50 min  Stress: No Stress Concern Present   Feeling of Stress : Not at all  Social Connections: Moderately Integrated   Frequency of Communication with Friends and Family: More than three times a week   Frequency of Social Gatherings with Friends and Family: Once a week   Attends Religious Services: More than 4 times per year   Active Member of [employer] or Organizations: No   Attends Archivist Meetings: Never   Marital Status: Married    Activities of Daily Living In your present state of health, do you have any difficulty performing the following activities: 11/20/2021 04/16/2021  Hearing? Y N  Comment right ear hearing loss -  Vision? N N  Difficulty concentrating or making decisions? N N  Walking or climbing stairs? N N  Dressing or bathing? N N  Doing errands, shopping? N -  Preparing Food and eating ? N -  Using the Toilet? N -  In the past six months, have you accidently leaked urine? N -  Do you have problems with loss of bowel control? N -  Managing your Medications? N -  Managing your Finances? N -  Housekeeping or managing your Housekeeping? N -  Some recent data might be hidden    Patient Education/ Literacy How often do you need to have someone help you when you read instructions, pamphlets, or other written materials from your doctor or pharmacy?: 1 - Never What is the last grade level you completed in school?: Bachelor's degree  Exercise Current Exercise Habits: Home exercise routine, Type of exercise: treadmill;strength training/weights;walking, Time (Minutes): 45, Frequency (Times/Week): 5, Weekly Exercise (Minutes/Week): 225, Intensity: Moderate, Exercise limited by: None identified  Diet Patient reports consuming 3 meals a day and 1 snack(s) a day Patient reports that her primary diet is: Regular Patient reports that she does have regular access to food.   Depression Screen PHQ 2/9  Scores 11/20/2021 07/08/2021 06/08/2020 10/03/2019 01/04/2019 02/03/2018 01/13/2018  PHQ - 2 Score 0 0 0 2 0 0 0  PHQ- 9 Score - - - 11 - - -     Fall Risk Fall Risk  11/20/2021 07/08/2021 06/08/2020 02/03/2018 01/13/2018  Falls in the past year? 0 0 0 No No  Number falls in past yr: 0 0 - - -  Injury with Fall? 0 0 - - -  Risk for fall due to : No Fall Risks No Fall Risks No Fall Risks - -  Follow up Falls evaluation completed Falls prevention discussed;Falls evaluation completed - - -     Objective:  ANGELIYAH KIRKEY seemed alert and oriented and she participated appropriately during our telephone visit.  Blood Pressure Weight BMI  BP Readings from Last 3 Encounters:  11/13/21 138/81  07/31/21 124/64  07/15/21 124/75   Wt Readings from Last 3 Encounters:  11/13/21 184 lb 4.8 oz (83.6 kg)  07/31/21 179 lb (81.2 kg)  07/15/21 178 lb 11.2 oz (81.1 kg)   BMI Readings from Last 1 Encounters:  11/13/21 32.65 kg/m    *Unable to obtain current vital signs, weight, and BMI due to telephone visit type  Hearing/Vision  Dawn [name] did not seem to have difficulty with hearing/understanding during the telephone conversation Reports that she has had a formal eye exam by an eye care professional within the past year Reports that she has not had a formal hearing evaluation within the past year *Unable to fully assess hearing and vision during telephone  visit type  Cognitive Function: 6CIT Screen 11/20/2021 06/08/2020  What Year? 0 points 0 points  What month? 0 points 0 points  What time? 0 points 0 points  Count back from 20 0 points 0 points  Months in reverse 0 points 0 points  Repeat phrase 0 points 2 points  Total Score 0 2   (Normal:0-7, Significant for Dysfunction: >8)  Normal Cognitive Function Screening: Yes   Immunization & Health Maintenance Record Immunization History  Administered Date(s) Administered   Fluad Quad(high Dose 65+) 06/15/2019, 06/20/2020, 07/08/2021   Influenza Split  06/15/2012   Influenza Whole 06/26/2009, 05/18/2013   Influenza,inj,Quad PF,6+ Mos 06/02/2014, 06/04/2016, 06/02/2018   Influenza-Unspecified 06/15/2017   PFIZER(Purple Top)SARS-COV-2 Vaccination 09/20/2019, 10/11/2019, 06/12/2020   PNEUMOCOCCAL CONJUGATE-20 07/31/2021   Pfizer Covid-19 Vaccine Bivalent Booster 45yr & up 06/12/2021   Pneumococcal Conjugate-13 06/29/2017   Td 12/14/2008   Tdap 03/25/2019   Zoster, Live 04/04/2011    Health Maintenance  Topic Date Due   Zoster Vaccines- Shingrix (1 of 2) 02/20/2022 (Originally 01/17/1970)   MAMMOGRAM  05/09/2023   DEXA SCAN  08/02/2023   COLONOSCOPY (Pts 45-432yrInsurance coverage will need to be confirmed)  11/21/2024   TETANUS/TDAP  03/24/2029   Pneumonia Vaccine 6538Years old  Completed   INFLUENZA VACCINE  Completed   COVID-19 Vaccine  Completed   Hepatitis C Screening  Completed   HPV VACCINES  Aged Out       Assessment  This is a routine wellness examination for ViBeola [name] Health Maintenance: Due or Overdue There are no preventive care reminders to display for this patient.   ViBeola Cordoes not need a referral for Community Assistance: Care Management:   no Social Work:    no Prescription Assistance:  no Nutrition/Diabetes Education:  no   Plan:  Personalized Goals  Goals Addressed               This Visit's Progress     Patient Stated (pt-stated)        11/20/2021 AWV Goal: Improved Nutrition/Diet  Patient will verbalize understanding that diet plays an important role in overall health and that a poor diet is a risk factor for many chronic medical conditions.  Over the next year, patient will improve self management of their diet by incorporating better food choices. Patient will utilize available community resources to help with food acquisition if needed (ex: food pantries, [address], etc) Patient will work with nutrition specialist if a referral was made        Personalized Health  Maintenance & Screening Recommendations  Shingrix   Lung Cancer Screening Recommended: no (Low Dose CT Chest recommended if Age 70-80ears, 30 pack-year currently smoking OR have quit w/in past 15 years) Hepatitis C Screening recommended: no HIV Screening recommended: no  Advanced Directives: Written information was not prepared per patient's request.  Referrals & Orders No orders of the defined types were placed in this encounter.   Follow-up Plan Follow-up with MeHali MarryMD as planned Schedule your Shingrix at your pharmacy. Medicare wellness visit in one year. Patient will access AVS on my chart.   I have personally reviewed and noted the following in the patients chart:   Medical and social history Use of alcohol, tobacco or illicit drugs  Current medications and supplements Functional ability and status Nutritional status Physical activity Advanced directives List of other physicians Hospitalizations, surgeries, and ER visits in previous 12 months Vitals Screenings to  include cognitive, depression, and falls Referrals and appointments  In addition, I have reviewed and discussed with Dawn [name] certain preventive protocols, quality metrics, and best practice recommendations. A written personalized care plan for preventive services as well as general preventive health recommendations is available and can be mailed to the patient at her request.      Tinnie Gens, RN  11/20/2021

## 2021-11-25 ENCOUNTER — Other Ambulatory Visit (HOSPITAL_COMMUNITY): Payer: Self-pay

## 2021-11-25 ENCOUNTER — Encounter: Payer: Self-pay | Admitting: Hematology

## 2021-11-25 MED FILL — Levothyroxine Sodium Tab 50 MCG: ORAL | 90 days supply | Qty: 90 | Fill #0 | Status: AC

## 2021-11-25 MED FILL — Olmesartan Medoxomil-Hydrochlorothiazide Tab 20-12.5 MG: ORAL | 90 days supply | Qty: 90 | Fill #0 | Status: AC

## 2021-11-26 ENCOUNTER — Other Ambulatory Visit (HOSPITAL_COMMUNITY): Payer: Self-pay

## 2021-11-27 ENCOUNTER — Other Ambulatory Visit (HOSPITAL_COMMUNITY): Payer: Self-pay

## 2021-12-30 ENCOUNTER — Other Ambulatory Visit (HOSPITAL_BASED_OUTPATIENT_CLINIC_OR_DEPARTMENT_OTHER): Payer: Self-pay

## 2021-12-30 ENCOUNTER — Ambulatory Visit (INDEPENDENT_AMBULATORY_CARE_PROVIDER_SITE_OTHER): Payer: PPO | Admitting: Family Medicine

## 2021-12-30 ENCOUNTER — Encounter: Payer: Self-pay | Admitting: Family Medicine

## 2021-12-30 VITALS — BP 134/65 | HR 74 | Resp 18 | Ht 63.0 in | Wt 185.0 lb

## 2021-12-30 DIAGNOSIS — R4189 Other symptoms and signs involving cognitive functions and awareness: Secondary | ICD-10-CM

## 2021-12-30 DIAGNOSIS — G8929 Other chronic pain: Secondary | ICD-10-CM | POA: Diagnosis not present

## 2021-12-30 DIAGNOSIS — R7301 Impaired fasting glucose: Secondary | ICD-10-CM | POA: Diagnosis not present

## 2021-12-30 DIAGNOSIS — L820 Inflamed seborrheic keratosis: Secondary | ICD-10-CM | POA: Diagnosis not present

## 2021-12-30 DIAGNOSIS — E7849 Other hyperlipidemia: Secondary | ICD-10-CM

## 2021-12-30 DIAGNOSIS — T7840XD Allergy, unspecified, subsequent encounter: Secondary | ICD-10-CM

## 2021-12-30 DIAGNOSIS — H9201 Otalgia, right ear: Secondary | ICD-10-CM

## 2021-12-30 DIAGNOSIS — R42 Dizziness and giddiness: Secondary | ICD-10-CM

## 2021-12-30 DIAGNOSIS — I10 Essential (primary) hypertension: Secondary | ICD-10-CM | POA: Diagnosis not present

## 2021-12-30 DIAGNOSIS — R519 Headache, unspecified: Secondary | ICD-10-CM | POA: Diagnosis not present

## 2021-12-30 DIAGNOSIS — E039 Hypothyroidism, unspecified: Secondary | ICD-10-CM | POA: Diagnosis not present

## 2021-12-30 LAB — POCT GLYCOSYLATED HEMOGLOBIN (HGB A1C): Hemoglobin A1C: 5.6 % (ref 4.0–5.6)

## 2021-12-30 MED ORDER — FLUTICASONE PROPIONATE 50 MCG/ACT NA SUSP
2.0000 | Freq: Every day | NASAL | 3 refills | Status: DC
  Filled 2021-12-30: qty 48, 90d supply, fill #0
  Filled 2022-03-26: qty 48, 90d supply, fill #1
  Filled 2022-06-23: qty 48, 90d supply, fill #2
  Filled 2022-09-21: qty 48, 90d supply, fill #3

## 2021-12-30 MED ORDER — LEVOTHYROXINE SODIUM 50 MCG PO TABS
ORAL_TABLET | ORAL | 3 refills | Status: DC
  Filled 2021-12-30: qty 90, fill #0
  Filled 2022-02-28: qty 57, 57d supply, fill #0
  Filled 2022-03-06 – 2022-04-21 (×2): qty 57, 57d supply, fill #1
  Filled 2022-06-21: qty 57, 57d supply, fill #2
  Filled 2022-08-18: qty 57, 57d supply, fill #3
  Filled 2022-10-14: qty 57, 57d supply, fill #4
  Filled 2022-12-10: qty 57, 57d supply, fill #5

## 2021-12-30 MED ORDER — OLMESARTAN MEDOXOMIL-HCTZ 20-12.5 MG PO TABS
1.0000 | ORAL_TABLET | Freq: Every day | ORAL | 3 refills | Status: DC
  Filled 2021-12-30 – 2022-03-26 (×2): qty 90, 90d supply, fill #0
  Filled 2022-06-23: qty 90, 90d supply, fill #1
  Filled 2022-09-17: qty 90, 90d supply, fill #2
  Filled 2022-12-17: qty 90, 90d supply, fill #3

## 2021-12-30 MED ORDER — ATORVASTATIN CALCIUM 10 MG PO TABS
ORAL_TABLET | ORAL | 3 refills | Status: DC
  Filled 2021-12-30: qty 90, 90d supply, fill #0
  Filled 2022-03-26: qty 90, 90d supply, fill #1

## 2021-12-30 NOTE — Assessment & Plan Note (Signed)
A1c looks great today at 5.6.  Continue current regimen. ?

## 2021-12-30 NOTE — Assessment & Plan Note (Signed)
At goal based on labs from February. ? ?Lab Results  ?Component Value Date  ? TSH 1.946 11/11/2021  ? ? ?

## 2021-12-30 NOTE — Assessment & Plan Note (Signed)
Well controlled. Continue current regimen. Follow up in  6 mo  

## 2021-12-30 NOTE — Progress Notes (Addendum)
? ?Established Patient Office Visit ? ?Subjective:  ?Patient ID: Dawn [name], female    DOB: 12-18-1950  Age: 71 y.o. MRN: [id number] ? ?CC:  ?Chief Complaint  ?Patient presents with  ? Follow-up  ?  6 month for Impaired fasting Glucose.   ? Nevus  ?  Right side. Uncomfortable when turning/twisting.    ? Fatigue  ?  Patient stated she has been feeling more fatigue lately. 2 months.   ? ? ?HPI ?Dawn [name] presents for  ? ?Impaired fasting glucose-no increased thirst or urination. No symptoms consistent with hypoglycemia. ? ?Fatigue for a couple of months.  She just feels a little bit more tired she says normally she will wake up refreshed but by midday she just feels tired she feels like she needs to lay down and take a nap.  She has noticed an increase in her appetite and noticed her weight creeping up.  But she has been going to [employer] 3 days a week and mostly doing weights while her husband is doing physical therapy for his knee replacement.  She just feels like it sometimes she has more of a "brain fog".  She does snore.  No recent chest pain or shortness of breath.  She has felt a little bit more down lately. ? ?There is  a skin lesion on her right side below her bra strap area that gets irritated on her clothing she like me to look at it today. ? ?Has upcoming appoint with Dr. Redmond Baseman with ENT. ? ?Is also interested in having LPa drawn, when she gets her lipids done this summer.  She is due in June.  She has heard that it helps better risk stratify someone who could be higher risk for cardiovascular disease..   ? ?Past Medical History:  ?Diagnosis Date  ? Anemia   ? as a child.  ? Arthritis   ? Atypical mole 06/04/2018  ? moderate top of right foot  ? Atypical mole 06/04/2018  ? left elbow mild  ? Atypical mole 06/04/2018  ? left upper back mild  ? Bilateral cataracts   ? Bilateral leg cramps   ? Cancer [facility])   ? skin  ? Dyspnea   ? Endometrial cancer ([place])   ? Family history of bladder cancer   ?  Family history of colon cancer in father   ? Fatty liver   ? Fuchs' corneal dystrophy   ? GERD (gastroesophageal reflux disease)   ? Hearing loss   ? Right side 30%  ? Heartburn   ? History of hiatal hernia   ? small size  ? History of radiation therapy 08/21/17, 08/28/17,09/01/17, 09/11/17, 09/17/17  ? Vaginal cuff brachytherapy.   ? History of radiation therapy 11/11/17- 12/08/17  ? Right Breast treated to 40.05 Gy with 15 fx of 2.67 Gy and a boost of 10 Gy with 5 fx.   ? Hyperlipidemia   ? Hypertension   ? Hypothyroidism   ? IBS (irritable bowel syndrome)   ? hx of  ? 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 NAFL (nonalcoholic fatty liver)   ? Obesity   ? Osteoarthritis   ? Osteopenia   ? PONV (postoperative nausea and vomiting)   ? Tuberculosis   ? tested positive, mother had when patient was child  ? Uterine cancer ([place]) 04/2017  ? endometrial cancer  ? Varices, gastric   ? Vertigo   ? ? ?Past Surgical History:  ?  Procedure Laterality Date  ? BREAST BIOPSY Right 04/27/2017  ? BREAST LUMPECTOMY WITH RADIOACTIVE SEED AND SENTINEL LYMPH NODE BIOPSY Right 05/25/2017  ? Procedure: RIGHT BREAST LUMPECTOMY WITH RADIOACTIVE SEED AND  RIGHT AXILLARY SENTINEL LYMPH NODE BIOPSY;  Surgeon: Excell Seltzer, MD;  Location: [place];  Service: General;  Laterality: Right;  ? CATARACT EXTRACTION, BILATERAL    ? COLONOSCOPY    ? HYSTEROSCOPY WITH D & C N/A 04/29/2017  ? Procedure: DILATATION AND CURETTAGE /HYSTEROSCOPY;  Surgeon: Linda Hedges, DO;  Location: [facility];  Service: Gynecology;  Laterality: N/A;  ? PORT-A-CATH REMOVAL Left 04/16/2021  ? Procedure: REMOVAL PORT-A-CATH;  Surgeon: Johnathan Hausen, MD;  Location: [place];  Service: General;  Laterality: Left;  ? PORTACATH PLACEMENT Left 05/25/2017  ? Procedure: INSERTION PORT-A-CATH;  Surgeon: Excell Seltzer, MD;  Location: [place];  Service: General;  Laterality: Left;  ? ROBOTIC  ASSISTED TOTAL HYSTERECTOMY WITH BILATERAL SALPINGO OOPHERECTOMY N/A 06/09/2017  ? Procedure: XI ROBOTIC ASSISTED TOTAL LAPOROSCOPIC HYSTERECTOMY WITH BILATERAL SALPINGO OOPHORECTOMY;  Surgeon: Everitt Amber, MD;  Location: [facility];  Service: Gynecology;  Laterality: N/A;  ? SENTINEL NODE BIOPSY N/A 06/09/2017  ? Procedure: SENTINEL NODE BIOPSY;  Surgeon: Everitt Amber, MD;  Location: [facility];  Service: Gynecology;  Laterality: N/A;  ? ? ?Family History  ?Problem Relation Age of Onset  ? Colon cancer Father 47  ? Heart attack Father 69  ? Prostate cancer Father   ?     dx 49's  ? Bladder Cancer Father 21  ? Hypertension Father   ? Hyperlipidemia Father   ? Heart disease Father   ? Obesity Father   ? Stroke Mother 49  ? Depression Mother   ? Anxiety disorder Mother   ? Bipolar disorder Mother   ? Obesity Mother   ? Diabetes Maternal Grandfather   ? Kidney disease Maternal Grandfather   ? Asthma Brother   ? Cancer Paternal Grandmother 17  ?     GYN cancer ( thinks ovarian, maybe uterine)  ? Heart attack Maternal Grandmother 70  ? Breast cancer Other 79  ? Colon cancer Other 43  ? Cancer Other   ?     type unk, age dx unk  ? ? ?Social History  ? ?Socioeconomic History  ? Marital status: Married  ?  Spouse name: Simona Huh  ? Number of children: 2  ? Years of education: 62  ? Highest education level: Bachelor's degree (e.g., BA, AB, BS)  ?Occupational History  ?  Employer: [employer]  ?  Comment: [employer]   ?Tobacco Use  ? Smoking status: Never  ? Smokeless tobacco: Never  ?Vaping Use  ? Vaping Use: Never used  ?Substance and Sexual Activity  ? Alcohol use: Not Currently  ?  Alcohol/week: 0.0 standard drinks  ?  Comment: <1/week wine or beer occasional  ? Drug use: No  ? Sexual activity: Yes  ?  Birth control/protection: Post-menopausal  ?Other Topics Concern  ? Not on file  ?Social History Narrative  ? Lives with her husband. Still works part time. She enjoys hiking, walking and gardening.    ? ?Social  Determinants of Health  ? ?Financial Resource Strain: Low Risk   ? Difficulty of Paying Living Expenses: Not hard at all  ?Food Insecurity: No Food Insecurity  ? Worried About Charity fundraiser in the Last Year: Never true  ? Ran Out of Food in the  Last Year: Never true  ?Transportation Needs: No Transportation Needs  ? Lack of Transportation (Medical): No  ? Lack of Transportation (Non-Medical): No  ?Physical Activity: Sufficiently Active  ? Days of Exercise per Week: 5 days  ? Minutes of Exercise per Session: 50 min  ?Stress: No Stress Concern Present  ? Feeling of Stress : Not at all  ?Social Connections: Moderately Integrated  ? Frequency of Communication with Friends and Family: More than three times a week  ? Frequency of Social Gatherings with Friends and Family: Once a week  ? Attends Religious Services: More than 4 times per year  ? Active Member of Clubs or Organizations: No  ? Attends Archivist Meetings: Never  ? Marital Status: Married  ?Intimate Partner Violence: Not At Risk  ? Fear of Current or Ex-Partner: No  ? Emotionally Abused: No  ? Physically Abused: No  ? Sexually Abused: No  ? ? ?Outpatient Medications Prior to Visit  ?Medication Sig Dispense Refill  ? acetaminophen (TYLENOL) 500 MG tablet Take 500 mg by mouth every 6 (six) hours as needed for moderate pain.    ? Alpha-Lipoic Acid 600 MG CAPS Take 1 capsule (600 mg total) by mouth daily. 90 capsule 3  ? anastrozole (ARIMIDEX) 1 MG tablet TAKE 1 TABLET BY MOUTH  DAILY 90 tablet 3  ? Ascorbic Acid (VITAMIN C) 1000 MG tablet Take 1,000 mg by mouth daily.    ? b complex vitamins capsule Take 1 capsule by mouth daily.    ? Cholecalciferol (VITAMIN D3) 75 MCG (3000 UT) TABS Take 1 capsule by mouth daily.    ? Multiple Vitamin (MULTIVITAMIN) capsule Take 1 capsule by mouth daily.    ? pantoprazole (PROTONIX) 20 MG tablet TAKE 1 TABLET BY MOUTH  DAILY 90 tablet 1  ? potassium chloride SA (KLOR-CON M) 20 MEQ tablet TAKE 1 TABLET BY MOUTH   DAILY 90 tablet 3  ? pyridOXINE (VITAMIN B-6) 50 MG tablet Take 1 tablet (50 mg total) by mouth daily. 90 tablet 3  ? atorvastatin (LIPITOR) 10 MG tablet TAKE 1 TABLET BY MOUTH  DAILY 90 tablet 3  ? f

## 2021-12-30 NOTE — Patient Instructions (Signed)
Due for cholesterol check in June.  ?

## 2022-01-06 ENCOUNTER — Ambulatory Visit (INDEPENDENT_AMBULATORY_CARE_PROVIDER_SITE_OTHER): Payer: PPO

## 2022-01-06 DIAGNOSIS — R42 Dizziness and giddiness: Secondary | ICD-10-CM

## 2022-01-06 DIAGNOSIS — H9201 Otalgia, right ear: Secondary | ICD-10-CM

## 2022-01-06 DIAGNOSIS — G8929 Other chronic pain: Secondary | ICD-10-CM

## 2022-01-06 DIAGNOSIS — R519 Headache, unspecified: Secondary | ICD-10-CM | POA: Diagnosis not present

## 2022-01-06 MED ORDER — GADOBUTROL 1 MMOL/ML IV SOLN
7.5000 mL | Freq: Once | INTRAVENOUS | Status: AC | PRN
  Administered 2022-01-06: 7.5 mL via INTRAVENOUS

## 2022-01-07 NOTE — Progress Notes (Signed)
Hi Jaycee, brain MRI overall looks good.  No sign of a stroke fluid or mass.  The sinuses look good.  They did see some early chronic microscopic vascular ischemic change this is where there is some hardening of the arteries of the tiny blood vessels towards the brain this occurs somewhat with aging and occurs more often in people with high blood pressure or high cholesterol.  So just 1 to make sure that we are doing a good job in controlling her blood pressure and cholesterol levels.  This is not related to your headaches or dizziness but is more of an incidental finding.  I think the chronic headaches particularly on the right side of your head could possibly be coming from your neck.  It could be a nerve that is being pinched at the base of the skull that comes up behind the ear and the head.  So 1 option we could consider is formal physical therapy for your neck to see if this is helpful for the headaches.  Another option would be we could always have you consult with an ENT to take another look at your ear.

## 2022-01-09 DIAGNOSIS — H5213 Myopia, bilateral: Secondary | ICD-10-CM | POA: Diagnosis not present

## 2022-01-09 DIAGNOSIS — H18513 Endothelial corneal dystrophy, bilateral: Secondary | ICD-10-CM | POA: Diagnosis not present

## 2022-01-09 DIAGNOSIS — Z961 Presence of intraocular lens: Secondary | ICD-10-CM | POA: Diagnosis not present

## 2022-01-09 DIAGNOSIS — H524 Presbyopia: Secondary | ICD-10-CM | POA: Diagnosis not present

## 2022-01-14 ENCOUNTER — Other Ambulatory Visit (HOSPITAL_COMMUNITY): Payer: Self-pay

## 2022-01-14 DIAGNOSIS — H90A21 Sensorineural hearing loss, unilateral, right ear, with restricted hearing on the contralateral side: Secondary | ICD-10-CM | POA: Diagnosis not present

## 2022-01-14 DIAGNOSIS — H9201 Otalgia, right ear: Secondary | ICD-10-CM | POA: Diagnosis not present

## 2022-01-14 DIAGNOSIS — H8101 Meniere's disease, right ear: Secondary | ICD-10-CM | POA: Diagnosis not present

## 2022-01-14 MED ORDER — DIAZEPAM 2 MG PO TABS
ORAL_TABLET | ORAL | 0 refills | Status: DC
  Filled 2022-01-14: qty 30, 7d supply, fill #0

## 2022-02-07 ENCOUNTER — Other Ambulatory Visit (HOSPITAL_BASED_OUTPATIENT_CLINIC_OR_DEPARTMENT_OTHER): Payer: Self-pay

## 2022-02-07 MED ORDER — ZOSTER VAC RECOMB ADJUVANTED 50 MCG/0.5ML IM SUSR
INTRAMUSCULAR | 0 refills | Status: DC
  Filled 2022-02-07: qty 1, 1d supply, fill #0

## 2022-02-07 MED FILL — Potassium Chloride Microencapsulated Crys ER Tab 20 mEq: ORAL | 90 days supply | Qty: 90 | Fill #1 | Status: AC

## 2022-02-12 ENCOUNTER — Ambulatory Visit: Payer: PPO | Admitting: Physician Assistant

## 2022-02-12 ENCOUNTER — Encounter: Payer: Self-pay | Admitting: Physician Assistant

## 2022-02-12 DIAGNOSIS — L820 Inflamed seborrheic keratosis: Secondary | ICD-10-CM | POA: Diagnosis not present

## 2022-02-12 DIAGNOSIS — Z86018 Personal history of other benign neoplasm: Secondary | ICD-10-CM | POA: Diagnosis not present

## 2022-02-12 DIAGNOSIS — Z1283 Encounter for screening for malignant neoplasm of skin: Secondary | ICD-10-CM | POA: Diagnosis not present

## 2022-02-12 NOTE — Progress Notes (Signed)
   Follow-Up Visit   Subjective  Dawn [name] is a 71 y.o. female who presents for the following: Annual Exam (Personal history of mild and moderate atypia, possible skin tags and waiver discussed if removal takes place.  ).She has a history of breast cancer.     The following portions of the chart were reviewed this encounter and updated as appropriate:  Tobacco  Allergies  Meds  Problems  Med Hx  Surg Hx  Fam Hx      Objective  Well appearing patient in no apparent distress; mood and affect are within normal limits.  A full examination was performed including scalp, head, eyes, ears, nose, lips, neck, chest, axillae, abdomen, back, buttocks, bilateral upper extremities, bilateral lower extremities, hands, feet, fingers, toes, fingernails, and toenails. All findings within normal limits unless otherwise noted below.  Full body skin examination-No atypical nevi or signs of NMSC noted at the time of the visit.   Left Axilla, Left Upper Back, right shoulder Stuck-on, waxy, tan-brown plaques on erythematous base --Discussed benign etiology and prognosis.    Assessment & Plan  Screening exam for skin cancer  Yearly skin examinations  Seborrheic keratosis, inflamed (3) right shoulder; Left Axilla; Left Upper Back  Destruction of lesion - Left Axilla, Left Upper Back, right shoulder Complexity: simple   Destruction method: cryotherapy   Informed consent: discussed and consent obtained   Timeout:  patient name, date of birth, surgical site, and procedure verified Lesion destroyed using liquid nitrogen: Yes   Cryotherapy cycles:  1 Outcome: patient tolerated procedure well with no complications   Post-procedure details: wound care instructions given      I, Aylen Stradford, PA-C, have reviewed all documentation's for this visit.  The documentation on 02/12/22 for the exam, diagnosis, procedures and orders are all accurate and complete.

## 2022-02-28 ENCOUNTER — Other Ambulatory Visit (HOSPITAL_BASED_OUTPATIENT_CLINIC_OR_DEPARTMENT_OTHER): Payer: Self-pay

## 2022-02-28 MED FILL — Anastrozole Tab 1 MG: ORAL | 90 days supply | Qty: 90 | Fill #0 | Status: AC

## 2022-03-04 ENCOUNTER — Other Ambulatory Visit (HOSPITAL_BASED_OUTPATIENT_CLINIC_OR_DEPARTMENT_OTHER): Payer: Self-pay

## 2022-03-06 ENCOUNTER — Other Ambulatory Visit (HOSPITAL_BASED_OUTPATIENT_CLINIC_OR_DEPARTMENT_OTHER): Payer: Self-pay

## 2022-03-06 ENCOUNTER — Telehealth: Payer: Self-pay | Admitting: Family Medicine

## 2022-03-06 DIAGNOSIS — E039 Hypothyroidism, unspecified: Secondary | ICD-10-CM

## 2022-03-06 NOTE — Telephone Encounter (Signed)
Received notification from pharmacy that they will have to switch her brand.  Recommend patient come back in 6 weeks for TSH check.  Lab ordered.

## 2022-03-07 ENCOUNTER — Other Ambulatory Visit (HOSPITAL_BASED_OUTPATIENT_CLINIC_OR_DEPARTMENT_OTHER): Payer: Self-pay

## 2022-03-26 MED FILL — Pantoprazole Sodium EC Tab 20 MG (Base Equiv): ORAL | 90 days supply | Qty: 90 | Fill #0 | Status: AC

## 2022-03-27 ENCOUNTER — Other Ambulatory Visit (HOSPITAL_COMMUNITY): Payer: Self-pay

## 2022-04-04 ENCOUNTER — Telehealth: Payer: Self-pay | Admitting: Family Medicine

## 2022-04-04 DIAGNOSIS — R7301 Impaired fasting glucose: Secondary | ICD-10-CM

## 2022-04-04 DIAGNOSIS — I10 Essential (primary) hypertension: Secondary | ICD-10-CM

## 2022-04-04 NOTE — Telephone Encounter (Signed)
Okay with lipid profile but I am not sure when an LPA is?

## 2022-04-04 NOTE — Telephone Encounter (Signed)
I called Dawn [name] and she states it is a better test to show if you have cardiovascular disease. I looked up the test with [facility].   Lipoprotein-associated phospholipase A2 (Lp-PLA2)

## 2022-04-04 NOTE — Telephone Encounter (Signed)
Pt called.  She wants to know if she can get tan Order for LPa and lipid profile test next week?

## 2022-04-07 ENCOUNTER — Other Ambulatory Visit: Payer: Self-pay

## 2022-04-07 DIAGNOSIS — E785 Hyperlipidemia, unspecified: Secondary | ICD-10-CM

## 2022-04-07 NOTE — Telephone Encounter (Signed)
Thank you for pending order.  Signed. Thank you.

## 2022-04-07 NOTE — Progress Notes (Signed)
Labs ordered.

## 2022-04-08 LAB — LIPID PANEL W/REFLEX DIRECT LDL
Cholesterol: 192 mg/dL (ref <200)
HDL: 58 mg/dL (ref >=50)
LDL Cholesterol (Calc): 102 mg/dL (calc) — ABNORMAL HIGH
Non-HDL Cholesterol (Calc): 134 mg/dL (calc) — ABNORMAL HIGH (ref <130)
Total CHOL/HDL Ratio: 3.3 (calc) (ref <5.0)
Triglycerides: 202 mg/dL — ABNORMAL HIGH (ref <150)

## 2022-04-08 NOTE — Telephone Encounter (Signed)
Patient advised.

## 2022-04-09 ENCOUNTER — Other Ambulatory Visit (HOSPITAL_COMMUNITY): Payer: Self-pay

## 2022-04-09 ENCOUNTER — Other Ambulatory Visit: Payer: Self-pay | Admitting: *Deleted

## 2022-04-09 DIAGNOSIS — E785 Hyperlipidemia, unspecified: Secondary | ICD-10-CM

## 2022-04-09 DIAGNOSIS — E7849 Other hyperlipidemia: Secondary | ICD-10-CM

## 2022-04-09 MED ORDER — ATORVASTATIN CALCIUM 20 MG PO TABS
20.0000 mg | ORAL_TABLET | Freq: Every day | ORAL | 3 refills | Status: DC
  Filled 2022-04-09: qty 90, 90d supply, fill #0
  Filled 2022-07-05: qty 90, 90d supply, fill #1
  Filled 2022-10-03: qty 90, 90d supply, fill #2
  Filled 2023-01-04: qty 90, 90d supply, fill #3

## 2022-04-09 NOTE — Progress Notes (Signed)
Dawn [name], LDL cholesterol is right at 102.  Goal is under 100.  How would you feel about increasing your atorvastatin to 20 mg and see if you tolerate that well without any side effects or problems.  We could then recheck your labs again in about 12 weeks.  If you are okay with this then please let me know.

## 2022-04-11 ENCOUNTER — Other Ambulatory Visit (HOSPITAL_BASED_OUTPATIENT_CLINIC_OR_DEPARTMENT_OTHER): Payer: Self-pay

## 2022-04-11 MED ORDER — ZOSTER VAC RECOMB ADJUVANTED 50 MCG/0.5ML IM SUSR
INTRAMUSCULAR | 0 refills | Status: DC
  Filled 2022-04-11: qty 0.5, 1d supply, fill #0

## 2022-04-21 ENCOUNTER — Other Ambulatory Visit (HOSPITAL_BASED_OUTPATIENT_CLINIC_OR_DEPARTMENT_OTHER): Payer: Self-pay

## 2022-04-23 ENCOUNTER — Encounter (INDEPENDENT_AMBULATORY_CARE_PROVIDER_SITE_OTHER): Payer: Self-pay

## 2022-05-07 ENCOUNTER — Other Ambulatory Visit (HOSPITAL_BASED_OUTPATIENT_CLINIC_OR_DEPARTMENT_OTHER): Payer: Self-pay

## 2022-05-07 MED FILL — Potassium Chloride Microencapsulated Crys ER Tab 20 mEq: ORAL | 90 days supply | Qty: 90 | Fill #2 | Status: AC

## 2022-05-12 ENCOUNTER — Encounter: Payer: Self-pay | Admitting: Hematology

## 2022-05-12 DIAGNOSIS — Z1231 Encounter for screening mammogram for malignant neoplasm of breast: Secondary | ICD-10-CM | POA: Diagnosis not present

## 2022-05-15 ENCOUNTER — Other Ambulatory Visit: Payer: Self-pay | Admitting: Surgery

## 2022-05-15 ENCOUNTER — Telehealth: Payer: Self-pay | Admitting: Surgery

## 2022-05-15 DIAGNOSIS — C541 Malignant neoplasm of endometrium: Secondary | ICD-10-CM

## 2022-05-15 DIAGNOSIS — E039 Hypothyroidism, unspecified: Secondary | ICD-10-CM

## 2022-05-15 DIAGNOSIS — C50211 Malignant neoplasm of upper-inner quadrant of right female breast: Secondary | ICD-10-CM

## 2022-05-15 NOTE — Telephone Encounter (Signed)
Pt left a voicemail asking if her thyroid levels are going to be checked on her upcoming appointment with Wilber Bihari, NP, on September 6.  The pt stated that her pharmacist recommended checking these levels since the manufacturer of the Levothyroxine had changed.  I notified Wilber Bihari, NP, and she ordered for a TSH and free T4 to be added to the pt's other labs that day.    These orders were placed and I called the pt back to let her know that these labs were added on.  She verbalized understanding and was told to call our office back if she had any more concerns.

## 2022-05-21 ENCOUNTER — Encounter: Payer: Self-pay | Admitting: Family Medicine

## 2022-05-21 ENCOUNTER — Inpatient Hospital Stay: Payer: PPO | Attending: Adult Health

## 2022-05-21 ENCOUNTER — Inpatient Hospital Stay: Payer: PPO | Admitting: Adult Health

## 2022-05-21 ENCOUNTER — Encounter: Payer: Self-pay | Admitting: Adult Health

## 2022-05-21 ENCOUNTER — Other Ambulatory Visit: Payer: Self-pay

## 2022-05-21 ENCOUNTER — Inpatient Hospital Stay: Payer: PPO

## 2022-05-21 VITALS — BP 156/85 | HR 72 | Temp 97.7°F | Resp 16 | Ht 63.0 in | Wt 187.2 lb

## 2022-05-21 DIAGNOSIS — C50211 Malignant neoplasm of upper-inner quadrant of right female breast: Secondary | ICD-10-CM | POA: Diagnosis not present

## 2022-05-21 DIAGNOSIS — E7800 Pure hypercholesterolemia, unspecified: Secondary | ICD-10-CM | POA: Insufficient documentation

## 2022-05-21 DIAGNOSIS — M810 Age-related osteoporosis without current pathological fracture: Secondary | ICD-10-CM

## 2022-05-21 DIAGNOSIS — M8580 Other specified disorders of bone density and structure, unspecified site: Secondary | ICD-10-CM | POA: Diagnosis not present

## 2022-05-21 DIAGNOSIS — C541 Malignant neoplasm of endometrium: Secondary | ICD-10-CM | POA: Insufficient documentation

## 2022-05-21 DIAGNOSIS — I10 Essential (primary) hypertension: Secondary | ICD-10-CM | POA: Diagnosis not present

## 2022-05-21 DIAGNOSIS — Z79811 Long term (current) use of aromatase inhibitors: Secondary | ICD-10-CM | POA: Diagnosis not present

## 2022-05-21 DIAGNOSIS — K219 Gastro-esophageal reflux disease without esophagitis: Secondary | ICD-10-CM | POA: Insufficient documentation

## 2022-05-21 DIAGNOSIS — E039 Hypothyroidism, unspecified: Secondary | ICD-10-CM

## 2022-05-21 DIAGNOSIS — Z170 Estrogen receptor positive status [ER+]: Secondary | ICD-10-CM

## 2022-05-21 DIAGNOSIS — E559 Vitamin D deficiency, unspecified: Secondary | ICD-10-CM | POA: Diagnosis not present

## 2022-05-21 DIAGNOSIS — Z79899 Other long term (current) drug therapy: Secondary | ICD-10-CM | POA: Insufficient documentation

## 2022-05-21 DIAGNOSIS — Z95828 Presence of other vascular implants and grafts: Secondary | ICD-10-CM

## 2022-05-21 LAB — CBC WITH DIFFERENTIAL (CANCER CENTER ONLY)
Abs Immature Granulocytes: 0.01 10*3/uL (ref 0.00–0.07)
Basophils Absolute: 0 10*3/uL (ref 0.0–0.1)
Basophils Relative: 0 %
Eosinophils Absolute: 0.1 10*3/uL (ref 0.0–0.5)
Eosinophils Relative: 3 %
HCT: 39.3 % (ref 36.0–46.0)
Hemoglobin: 14 g/dL (ref 12.0–15.0)
Immature Granulocytes: 0 %
Lymphocytes Relative: 40 %
Lymphs Abs: 1.8 10*3/uL (ref 0.7–4.0)
MCH: 34.1 pg — ABNORMAL HIGH (ref 26.0–34.0)
MCHC: 35.6 g/dL (ref 30.0–36.0)
MCV: 95.6 fL (ref 80.0–100.0)
Monocytes Absolute: 0.4 10*3/uL (ref 0.1–1.0)
Monocytes Relative: 8 %
Neutro Abs: 2.1 10*3/uL (ref 1.7–7.7)
Neutrophils Relative %: 49 %
Platelet Count: 184 10*3/uL (ref 150–400)
RBC: 4.11 MIL/uL (ref 3.87–5.11)
RDW: 12.2 % (ref 11.5–15.5)
WBC Count: 4.4 10*3/uL (ref 4.0–10.5)
nRBC: 0 % (ref 0.0–0.2)

## 2022-05-21 LAB — CMP (CANCER CENTER ONLY)
ALT: 41 U/L (ref 0–44)
AST: 25 U/L (ref 15–41)
Albumin: 4.5 g/dL (ref 3.5–5.0)
Alkaline Phosphatase: 36 U/L — ABNORMAL LOW (ref 38–126)
Anion gap: 7 (ref 5–15)
BUN: 13 mg/dL (ref 8–23)
CO2: 27 mmol/L (ref 22–32)
Calcium: 9.6 mg/dL (ref 8.9–10.3)
Chloride: 103 mmol/L (ref 98–111)
Creatinine: 0.8 mg/dL (ref 0.44–1.00)
GFR, Estimated: >60 mL/min (ref >60)
Glucose, Bld: 147 mg/dL — ABNORMAL HIGH (ref 70–99)
Potassium: 3.5 mmol/L (ref 3.5–5.1)
Sodium: 137 mmol/L (ref 135–145)
Total Bilirubin: 0.7 mg/dL (ref 0.3–1.2)
Total Protein: 7.5 g/dL (ref 6.5–8.1)

## 2022-05-21 LAB — VITAMIN D 25 HYDROXY (VIT D DEFICIENCY, FRACTURES): Vit D, 25-Hydroxy: 49.83 ng/mL (ref 30–100)

## 2022-05-21 LAB — TSH: TSH: 2.484 u[IU]/mL (ref 0.350–4.500)

## 2022-05-21 LAB — T4, FREE: Free T4: 0.71 ng/dL (ref 0.61–1.12)

## 2022-05-21 MED ORDER — ZOLEDRONIC ACID 4 MG/100ML IV SOLN
4.0000 mg | Freq: Once | INTRAVENOUS | Status: AC
  Administered 2022-05-21: 4 mg via INTRAVENOUS
  Filled 2022-05-21: qty 100

## 2022-05-21 MED ORDER — SODIUM CHLORIDE 0.9 % IV SOLN: Freq: Once | INTRAVENOUS | Status: AC

## 2022-05-21 NOTE — Progress Notes (Signed)
[facility] Follow up:    [name], MD [address] [place] [address]   DIAGNOSIS:  Cancer Staging  Endometrial cancer [facility]) Staging form: Corpus Uteri - Adenosarcoma, AJCC 8th Edition - Pathologic stage from 06/09/2017: Stage IIIC (pT1a, pN1, cM0) - Signed by Truitt Merle, MD on 06/16/2017 Histologic grade (G): G3 Histologic grading system: 3 grade system Residual tumor (R): R0 - None Lymph-vascular invasion (LVI): LVI present/identified, NOS  Malignant neoplasm of upper-inner quadrant of right breast in female, estrogen receptor positive ([place]) Staging form: Breast, AJCC 8th Edition - Clinical stage from 05/06/2017: Stage IA (cT1c, cN0, cM0, G2, ER+, PR-, HER2+) - Unsigned Histologic grading system: 3 grade system Laterality: Right Staged by: Pathologist and managing physician Stage used in treatment planning: Yes National guidelines used in treatment planning: Yes Type of national guideline used in treatment planning: NCCN - Pathologic stage from 05/25/2017: Stage IIA (pT2, pN0, cM0, G2, ER+, PR-, HER2-) - Signed by Truitt Merle, MD on 06/16/2017 Neoadjuvant therapy: No Histologic grading system: 3 grade system Laterality: Right   SUMMARY OF ONCOLOGIC HISTORY: Oncology History Overview Note  MSI high Cancer Staging Endometrial cancer ([place]) Staging form: Corpus Uteri - Adenosarcoma, AJCC 8th Edition - Pathologic stage from 06/09/2017: Stage IIIC (pT1a, pN1, cM0) - Signed by Truitt Merle, MD on 06/16/2017  Malignant neoplasm of upper-inner quadrant of right breast in female, estrogen receptor positive ([place]) Staging form: Breast, AJCC 8th Edition - Clinical stage from 05/06/2017: Stage IA (cT1c, cN0, cM0, G2, ER: Positive, PR: Negative, HER2: Positive) - Unsigned - Pathologic stage from 05/25/2017: Stage IIA (pT2, pN0, cM0, G2, ER: Positive, PR: Negative, HER2: Negative) - Signed by Truitt Merle, MD on 06/16/2017     Malignant neoplasm  of upper-inner quadrant of right breast in female, estrogen receptor positive ([place])  04/27/2017 Initial Biopsy   Diagnosis Breast, right, needle core biopsy INVASIVE DUCTAL CARCINOMA, GRADE 2 Microscopic Comment The neoplasm has intracellular mucin and signet ring cell features. Immunostains shows these cells are positive for ER,GATA3, ck7 and GCDFP, negative for ck20, cdx2, TTF-1 and pax8, The immunostaining pattern supports the neoplasm is breast primary.   04/27/2017 Receptors her2   Estrogen Receptor: 70%, POSITIVE, MODERATE STAINING INTENSITY Progesterone Receptor: 0%, NEGATIVE Proliferation Marker Ki67: 30% HER2 - **POSITIVE** RATIO OF HER2/CEP17 SIGNALS 2.91 AVERAGE HER2 COPY NUMBER PER CELL 7.28   04/27/2017 Initial Diagnosis   Malignant neoplasm of upper-inner quadrant of right breast in female, estrogen receptor positive ([place])   05/07/2017 Imaging   CT Chest W Contrast 05/07/17 IMPRESSION: Tiny well-defined fatty lesion on the pleura at the right lung base. The thinner slice collimation used for today's chest CT eliminates volume-averaging seen in the lesion on the prior exam and confirms that this is a diffusely fatty nodule. This is a benign finding and likely represents a tiny lipoma. No defect in the hemidiaphragm evident to suggest tiny diaphragmatic hernia. Pulmonary hamartoma a consideration although the lack of soft tissue components makes this less likely.   05/15/2017 Genetic Testing   Patient had genetic testing due to a personal history of breast cancer and uterine cancer as well as a family history of cancer. The Multi-Cancer panel was ordered. The Multi-Cancer Panel offered by [facility] includes sequencing and/or deletion duplication testing of the following 83 genes: ALK, APC, ATM, AXIN2,BAP1,  BARD1, BLM, BMPR1A, BRCA1, BRCA2, BRIP1, CASR, CDC73, CDH1, CDK4, CDKN1B, CDKN1C, CDKN2A (p14ARF), CDKN2A (p16INK4a), CEBPA, CHEK2, CTNNA1, DICER1, DIS3L2, EGFR (  c.2369C>T,  p.Thr790Met variant only), EPCAM (Deletion/duplication testing only), FH, FLCN, GATA2, GPC3, GREM1 (Promoter region deletion/duplication testing only), HOXB13 (c.251G>A, p.Gly84Glu), HRAS, KIT, MAX, MEN1, MET, MITF (c.952G>A, p.Glu318Lys variant only), MLH1, MSH2, MSH3, MSH6, MUTYH, NBN, NF1, NF2, NTHL1, PALB2, PDGFRA, PHOX2B, PMS2, POLD1, POLE, POT1, PRKAR1A, PTCH1, PTEN, RAD50, RAD51C, RAD51D, RB1, RECQL4, RET, RUNX1, SDHAF2, SDHA (sequence changes only), SDHB, SDHC, SDHD, SMAD4, SMARCA4, SMARCB1, SMARCE1, STK11, SUFU, TERC, TERT, TMEM127, TP53, TSC1, TSC2, VHL, WRN and WT1.    Results: No pathogenic mutations were identified. A VUS in the GATA2 gene c.1348G>A (p.Gly450Arg) was identified.  The date of this test report is 05/25/2017.     05/25/2017 Surgery   RIGHT BREAST LUMPECTOMY WITH RADIOACTIVE SEED AND  RIGHT AXILLARY SENTINEL LYMPH NODE BIOPSY and Pot placement by Dr. Excell Seltzer 05/25/17   05/25/2017 Pathology Results   Diagnosis 05/25/17 1. Breast, lumpectomy, right - INVASIVE DUCTAL CARCINOMA, GRADE II/III, SPANNING 2.2 CM. - DUCTAL CARCINOMA IN SITU, HIGH GRADE. - THE SURGICAL RESECTION MARGINS ARE NEGATIVE FOR CARCINOMA. - SEE ONCOLOGY TABLE BELOW. 2. Breast, excision, right additional superior margin - BENIGN FIBROADIPOSE TISSUE. - BENIGN SKELETAL MUSCLE. - SEE COMMENT. 3. Breast, excision, right additional lateral margin - BENIGN BREAST PARENCHYMA. - THERE IS NO EVIDENCE OF MALIGNANCY. - SEE COMMENT. 4. Breast, excision, chest wall margin - BENIGN FIBROADIPOSE TISSUE. - THERE IS NO EVIDENCE OF MALIGNANCY. - SEE COMMENT. 5. Lymph node, sentinel, biopsy, right axillary - THERE IS NO EVIDENCE OF CARCINOMA IN 1 OF 1 LYMPH NODE (0/1). 6. Lymph node, sentinel, biopsy, right axillary - THERE IS NO EVIDENCE OF CARCINOMA IN 1 OF 1 LYMPH NODE (0/1). 7. Lymph node, sentinel, biopsy, right axillary - THERE IS NO EVIDENCE OF CARCINOMA IN 1 OF 1 LYMPH NODE (0/1).   06/26/2017 PET scan    PET  IMPRESSION: 1. Postoperative findings both in the right breast and in the anatomic pelvis, with associated low-grade activity considered to be postoperative in nature. No hypermetabolic adenopathy or hypermetabolic lesions are identified to suggest active metastatic disease/malignancy. 2. Other imaging findings of potential clinical significance: Aortic Atherosclerosis (ICD10-I70.0). Sigmoid colon diverticulosis. Biapical pleuroparenchymal scarring in the lungs. Small amount of free pelvic fluid in the cul-de-sac, likely postoperative.     07/01/2017 - 10/14/2017 Chemotherapy   Adjuvant TCH with Onpro every 3 weeks for 6 cycles starting on 07/01/17, Changed Taxol to abraxane with cycle 4 due to drug rash reaction, followed by Herceptin every 3 weeks for 6 months    11/01/2017 - 12/08/2017 Radiation Therapy   Radiation therapy to her right breast with [name]   11/04/2017 - 06/2018 Chemotherapy   Maintenance Herceptin starting 11/04/17 and will comeplete her 12 months in 06/2018    12/23/2017 -  Anti-estrogen oral therapy   Adjuvant letrozole 2.5 mg daily started 12/23/17, switched to exemestane on 04/21/18 due to joint pain. Could not afford aromasin, started anastrozole 04/2018.    12/30/2017 Imaging   CT CAP W Contrast 12/30/17 IMPRESSION: Increased size of 1.1 cm aorto-caval retroperitoneal lymph node. This could be reactive due to interval hysterectomy, however metastatic carcinoma cannot be excluded. Consider continued attention on short-term follow-up CT, or PET-CT scan for further evaluation.   Mild hepatic steatosis.   01/25/2018 PET scan   IMPRESSION: 1. Enlarging hypermetabolic aortocaval lymph node is worrisome for metastatic disease. 2. Probable postoperative seroma in the medial right breast, with associated mild inflammatory hypermetabolism.   04/28/2018 Mammogram   Probably benign. Calcifications in the right breast most  likely are dystrophic, related to previous  surgery, and are probably benign.    04/28/2018 Imaging   DEXA Scan T Score -2.0   06/10/2018 Echocardiogram   06/10/2018 ECHO LV EF: 55% -   60%   08/28/2018 - 08/2018 Chemotherapy   Neratinib 32m for 1 week then titrate up with 1 additional tablet weekly up to 12 mg starting 08/28/18. Stopped soon after starting due to diarrhea.   10/25/2018 Imaging   CT CAP 10/25/18  IMPRESSION: 1. No evidence of metastatic disease. 2.  Aortic atherosclerosis (ICD10-170.0).   06/15/2019 Pathology Results   Diagnosis Breast, right, needle core biopsy, 1 o'clock, 7cmfn - DENSE FIBROSIS CONSISTENT WITH PRIOR PROCEDURAL CHANGES. - NO MALIGNANCY IDENTIFIED.   10/24/2019 Imaging   CT CAP W Contrast  IMPRESSION: 1. Stable exam. No evidence of recurrent or metastatic carcinoma within the chest, abdomen, or pelvis. 2. Colonic diverticulosis, without radiographic evidence of diverticulitis.   Aortic Atherosclerosis (ICD10-I70.0).   12/05/2020 Imaging   MRI Breast  IMPRESSION: No suspicious areas of enhancement identified within the right or left breast to suggest malignancy.   Endometrial adenocarcinoma ([place]  05/07/2017 Initial Diagnosis   Endometrial adenocarcinoma ([place]   06/09/2017 Surgery   XI ROBOTIC ASSISTED TOTAL LAPOROSCOPIC HYSTERECTOMY WITH BILATERAL SALPINGO OOPHORECTOMY and SENTINEL NODE BIOPSY by Dr. RDenman George9/25/18   06/09/2017 Pathology Results   Diagnosis 06/09/17 1. Lymph node, sentinel, biopsy, right external iliac - METASTATIC ADENOCARCINOMA IN ONE LYMPH NODE (1/1). 2. Lymph node, sentinel, biopsy, left obturator - ONE BENIGN LYMPH NODE (0/1). 3. Lymph node, sentinel, biopsy, left external iliac - METASTATIC ADENOCARCINOMA IN ONE LYMPH NODE (1/1). 4. Uterus +/- tubes/ovaries, neoplastic ENDOMETRIUM: - ENDOMETRIAL ADENOCARCINOMA, 3.2 CM. - CARCINOMA INVADES INNER HALF OF MYOMETRIUM. 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 - CERVIX, BILATERAL FALLOPIAN TUBES AND BILATERAL  OVARIES FREE OF TUMOR   08/18/2017 - 09/17/2017 Radiation Therapy   vaginal brachytherapy per Dr. 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   02/17/2018 - 02/08/2020 Antibody Plan   Keytruda every 3 weeks starting 02/17/18. Completed 2 years on 02/08/20   04/11/2019 Imaging   CT AP W Contrast  IMPRESSION: 1. No evidence of recurrent or metastatic carcinoma within the abdomen or pelvis. 2. Colonic diverticulosis. No radiographic evidence of diverticulitis.     04/23/2020 Imaging   CT AP W contrast  IMPRESSION: 1. Stable exam. Specifically, no findings to suggest recurrent or metastatic disease. 2. Left colonic diverticulosis without diverticulitis. 3. Aortic Atherosclerosis (ICD10-I70.0).     10/22/2020 Imaging   CT CAP  IMPRESSION: 1. No evidence of metastatic disease in the chest, abdomen or pelvis. 2. Small hiatal hernia. 3. Mild sigmoid diverticulosis. 4. Aortic Atherosclerosis (ICD10-I70.0).       CURRENT THERAPY: Anastrozole  INTERVAL HISTORY: Dawn PARDOE89y.[id number]. female returns for f/u of her breast and endometrial cancer.  She underwent bilateral screening mammogram last week on 05/13/2022 that showed no evidence of malignancy and breast density category C.  She undergoes bone density testing every 2 years and this  scheduled in 07/2022.  She receives Zometa annually for her osteoporosis and this is due today.  She denies any side effects and tolerates this well.    She is taking anastrozole daily with good tolerance.  She has difficulty sleeping a couple of nights a week.    She has an active lifestyle and  does weights 3 days a week and walks 3-4 days a week.  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     Patient Active Problem List   Diagnosis Date Noted   Hypothyroid 01/02/2021   Drug-induced  polyneuropathy ([place]) 01/02/2021   Hallux valgus, acquired, bilateral 07/18/2020   Right hand pain 06/20/2020   Peripheral neuropathy 06/20/2020   Aortic atherosclerosis ([place]) 11/19/2017   Port-A-Cath in place 07/22/2017   Endometrial cancer ([place]) 06/09/2017   Genetic testing 05/19/2017   Family history of colon cancer in father 05/07/2017   Family history of bladder cancer 05/07/2017   Endometrial adenocarcinoma ([place]) 05/07/2017   Malignant neoplasm of upper-inner quadrant of right breast in female, estrogen receptor positive ([place]) 05/01/2017   Osteoporosis 05/24/2015   IFG (impaired fasting glucose) 12/02/2013   Meniere disease 01/31/2011   SEBORRHEIC KERATOSIS 07/10/2010   MUSCLE SPASM, TRAPEZIUS 07/10/2010   OSTEOARTHRITIS, 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   POSTMENOPAUSAL STATUS 03/08/2010   INTERNAL HEMORRHOIDS 07/26/2009   EROSIVE ESOPHAGITIS 07/26/2009   DIVERTICULOSIS, COLON 07/26/2009   Fatty liver 07/26/2009   COLONIC POLYPS, HYPERPLASTIC, HX OF 07/26/2009   Transaminitis 01/31/2009   HYPERTENSION, BENIGN 01/16/2009   Hyperlipidemia 04/22/2007   LOSS, HEARING NOS 04/22/2007    is allergic to ciprofloxacin, paclitaxel, crestor [rosuvastatin calcium], fosamax [alendronate sodium], livalo [pitavastatin], and penicillins.  MEDICAL HISTORY: Past Medical History:  Diagnosis Date   Anemia    as a child.   Arthritis    Atypical mole 06/04/2018   moderate top of right foot   Atypical mole 06/04/2018   left elbow mild   Atypical mole 06/04/2018   left upper back mild   Bilateral cataracts    Bilateral leg cramps    Cancer ([place])    skin   Dyspnea    Endometrial cancer ([place])    Family history of bladder cancer    Family history of colon cancer in father    Fatty liver    Fuchs' corneal dystrophy    GERD (gastroesophageal reflux disease)    Hearing loss    Right side 30%   Heartburn    History of hiatal hernia    small size   History of radiation therapy 08/21/17,  08/28/17,09/01/17, 09/11/17, 09/17/17   Vaginal cuff brachytherapy.    History of radiation therapy 11/11/17- 12/08/17   Right Breast treated to 40.05 Gy with 15 fx of 2.67 Gy and a boost of 10 Gy with 5 fx.    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, mother had when patient was child   Uterine cancer ([place]) 04/2017   endometrial cancer   Varices, gastric    Vertigo     SURGICAL HISTORY: Past Surgical History:  Procedure Laterality Date   BREAST BIOPSY Right 04/27/2017   BREAST LUMPECTOMY WITH RADIOACTIVE SEED AND SENTINEL LYMPH NODE BIOPSY Right 05/25/2017   Procedure: RIGHT BREAST LUMPECTOMY WITH RADIOACTIVE SEED AND  RIGHT AXILLARY SENTINEL LYMPH NODE BIOPSY;  Surgeon: Excell Seltzer, MD;  Location: [facility];  Service:  General;  Laterality: Right;   CATARACT EXTRACTION, BILATERAL     COLONOSCOPY     HYSTEROSCOPY WITH D & C N/A 04/29/2017   Procedure: DILATATION AND CURETTAGE /HYSTEROSCOPY;  Surgeon: Linda Hedges, DO;  Location: [facility];  Service: Gynecology;  Laterality: N/A;   PORT-A-CATH REMOVAL Left 04/16/2021   Procedure: REMOVAL PORT-A-CATH;  Surgeon: Johnathan Hausen, MD;  Location: [facility];  Service: General;  Laterality: Left;   PORTACATH PLACEMENT Left 05/25/2017   Procedure: INSERTION PORT-A-CATH;  Surgeon: Excell Seltzer, MD;  Location: [facility];  Service: General;  Laterality: Left;   ROBOTIC ASSISTED TOTAL HYSTERECTOMY WITH BILATERAL SALPINGO OOPHERECTOMY N/A 06/09/2017   Procedure: XI ROBOTIC ASSISTED TOTAL LAPOROSCOPIC HYSTERECTOMY WITH BILATERAL SALPINGO OOPHORECTOMY;  Surgeon:  Everitt Amber, MD;  Location: [facility];  Service: Gynecology;  Laterality: N/A;   SENTINEL NODE BIOPSY N/A 06/09/2017   Procedure: SENTINEL NODE BIOPSY;  Surgeon: Everitt Amber, MD;  Location: [facility];  Service: Gynecology;  Laterality: N/A;    SOCIAL HISTORY: Social History   Socioeconomic History   Marital status: Married    Spouse name: Simona Huh   Number of children: 2   Years of education: 16   Highest education level: Bachelor's degree (e.g., BA, AB, BS)  Occupational History    Employer: [employer]    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 Still works part time. She enjoys hiking, walking and gardening.     Social Determinants of Health   Financial Resource Strain: Low Risk  (11/20/2021)   Overall Financial Resource Strain (CARDIA)    Difficulty of Paying Living Expenses: Not hard at all  Food Insecurity: No Food Insecurity (11/20/2021)   Hunger Vital Sign    Worried About Running Out of Food in the Last Year: Never true    Ran Out of Food in the Last Year: Never true  Transportation Needs: No Transportation Needs (11/20/2021)   PRAPARE - Hydrologist (Medical): No    Lack of Transportation (Non-Medical): No  Physical Activity: Sufficiently Active (11/20/2021)   Exercise Vital Sign    Days of Exercise per Week: 5 days    Minutes of Exercise per Session: 50 min  Stress: No Stress Concern Present (11/20/2021)   [place]    Feeling of Stress : Not at all  Social  Connections: Moderately Integrated (11/20/2021)   Social Connection and Isolation Panel [NHANES]    Frequency of Communication with Friends and Family: More than three times a week    Frequency of Social Gatherings with Friends and Family: Once a week    Attends Religious Services: More than 4 times per year    Active Member of [employer] or Organizations: No    Attends Archivist Meetings: Never    Marital Status: Married  Human resources officer Violence: Not At Risk (11/20/2021)   Humiliation, Afraid, Rape, and Kick questionnaire    Fear of Current or  Ex-Partner: No    Emotionally Abused: No    Physically Abused: No    Sexually Abused: No    FAMILY HISTORY: Family History  Problem Relation Age of Onset   Colon cancer Father 26   Heart attack Father 77   Prostate cancer Father        dx 71's   Bladder Cancer Father 43   Hypertension Father    Hyperlipidemia Father    Heart disease Father    Obesity Father    Stroke Mother 45   Depression Mother    Anxiety disorder Mother    Bipolar disorder Mother    Obesity Mother    Diabetes Maternal Grandfather    Kidney disease Maternal Grandfather    Asthma Brother    Cancer Paternal Grandmother 75       GYN cancer ( thinks ovarian, maybe uterine)   Heart attack Maternal Grandmother 70   Breast cancer Other 55   Colon cancer Other 41   Cancer Other        type unk, age dx unk    Review of Systems  Constitutional:  Negative for appetite change, chills, fatigue, fever and unexpected weight change.  HENT:   Negative for hearing loss, lump/mass and trouble swallowing.   Eyes:  Negative for eye problems and icterus.  Respiratory:  Negative for chest tightness, cough and shortness of breath.   Cardiovascular:  Negative for chest pain, leg swelling and palpitations.  Gastrointestinal:  Negative for abdominal distention, abdominal pain, constipation, diarrhea, nausea and vomiting.  Endocrine: Negative for hot flashes.  Genitourinary:   Negative for difficulty urinating.   Musculoskeletal:  Negative for arthralgias.  Skin:  Negative for itching and rash.  Neurological:  Negative for dizziness, extremity weakness, headaches and numbness.  Hematological:  Negative for adenopathy. Does not bruise/bleed easily.  Psychiatric/Behavioral:  Negative for depression. The patient is not nervous/anxious.       PHYSICAL EXAMINATION  ECOG PERFORMANCE STATUS: 0 - Asymptomatic  Vitals:   05/21/22 0835  BP: (!) 156/85  Pulse: 72  Resp: 16  Temp: 97.7 F (36.5 C)  SpO2: 98%    Physical Exam Constitutional:      General: She is not in acute distress.    Appearance: Normal appearance. She is not toxic-appearing.  HENT:     Head: Normocephalic and atraumatic.  Eyes:     General: No scleral icterus. Cardiovascular:     Rate and Rhythm: Normal rate and regular rhythm.     Pulses: Normal pulses.     Heart sounds: Normal heart sounds.  Pulmonary:     Effort: Pulmonary effort is normal.     Breath sounds: Normal breath sounds.  Chest:     Comments: Right breast s/p lumpectomy and radiation, no sign of local recurrence, left breast is benign Abdominal:     General: Abdomen is flat. Bowel sounds are normal. There is no distension.     Palpations: Abdomen is soft.     Tenderness: There is no abdominal tenderness.  Musculoskeletal:        General: No swelling.     Cervical back: Neck supple.  Lymphadenopathy:     Cervical: No cervical adenopathy.  Skin:    General: Skin is warm and dry.     Findings: No rash.  Neurological:     General: No focal deficit present.     Mental Status: She is alert.  Psychiatric:        Mood and Affect:  Mood normal.        Behavior: Behavior normal.     LABORATORY DATA:  CBC    Component Value Date/Time   WBC 4.4 05/21/2022 0819   WBC 4.1 07/25/2020 0928   RBC 4.11 05/21/2022 0819   HGB 14.0 05/21/2022 0819   HGB 11.3 (L) 09/11/2017 1019   HCT 39.3 05/21/2022 0819   HCT 32.8 (L)  09/11/2017 1019   PLT 184 05/21/2022 0819   PLT 201 09/11/2017 1019   MCV 95.6 05/21/2022 0819   MCV 102.6 (H) 09/11/2017 1019   MCH 34.1 (H) 05/21/2022 0819   MCHC 35.6 05/21/2022 0819   RDW 12.2 05/21/2022 0819   RDW 17.2 (H) 09/11/2017 1019   LYMPHSABS 1.8 05/21/2022 0819   LYMPHSABS 3.2 09/11/2017 1019   MONOABS 0.4 05/21/2022 0819   MONOABS 1.8 (H) 09/11/2017 1019   EOSABS 0.1 05/21/2022 0819   EOSABS 0.1 09/11/2017 1019   BASOSABS 0.0 05/21/2022 0819   BASOSABS 0.1 09/11/2017 1019    CMP     Component Value Date/Time   NA 137 05/21/2022 0819   NA 138 02/18/2021 1244   NA 136 09/02/2017 0928   K 3.5 05/21/2022 0819   K 3.3 (L) 09/02/2017 0928   CL 103 05/21/2022 0819   CO2 27 05/21/2022 0819   CO2 27 09/02/2017 0928   GLUCOSE 147 (H) 05/21/2022 0819   GLUCOSE 96 09/02/2017 0928   BUN 13 05/21/2022 0819   BUN 12 02/18/2021 1244   BUN 9.0 09/02/2017 0928   CREATININE 0.80 05/21/2022 0819   CREATININE 0.8 09/02/2017 0928   CALCIUM 9.6 05/21/2022 0819   CALCIUM 9.4 09/02/2017 0928   PROT 7.5 05/21/2022 0819   PROT 7.6 02/18/2021 1244   PROT 7.2 09/02/2017 0928   ALBUMIN 4.5 05/21/2022 0819   ALBUMIN 4.7 02/18/2021 1244   ALBUMIN 3.8 09/02/2017 0928   AST 25 05/21/2022 0819   AST 96 (H) 09/02/2017 0928   ALT 41 05/21/2022 0819   ALT 144 (H) 09/02/2017 0928   ALKPHOS 36 (L) 05/21/2022 0819   ALKPHOS 57 09/02/2017 0928   BILITOT 0.7 05/21/2022 0819   BILITOT 0.57 09/02/2017 0928   GFRNONAA >60 05/21/2022 0819   GFRNONAA 81 06/04/2016 0839   GFRAA >60 04/23/2020 0916   GFRAA >60 07/27/2019 0920   GFRAA >89 06/04/2016 0839       ASSESSMENT and THERAPY PLAN:   Endometrial adenocarcinoma ([place]) No clinical or radiographic signs of recurrence.  She is due 1 more CT chest/abdomen and Pelvis per NCCN guidelines which I ordered for February, 2024.      Malignant neoplasm of upper-inner quadrant of right breast in female, estrogen receptor positive  ([place]) [place] has no clinical or radiographic signs of breast cancer recurrence.  She will continue on anastrozole daily which she tolerates well.  She is due for repeat mammogram in 04/2023.    We discussed her breast density which is category c.  She was curious about breast MRI and I informed her about the FAST MRI screening and she will let me know if she wants me to order this.    We discussed healthy diet, exercise, and keeping up with her preventative health.    Osteoporosis Receiving Zometa annually and will repeat bone density testing in 07/2022 at [facility] which is scheduled.    I recommended she continue with her calcium, vitamin d intake, and weight bearing exercises.      All questions were answered. The patient knows  to call the clinic with any problems, questions or concerns. We can certainly see the patient much sooner if necessary._0  Total encounter time:30 minutes*in face-to-face visit time, chart review, lab review, care coordination, order entry, and documentation of the encounter time.    Wilber Bihari, NP 05/21/22 9:27 AM Medical Oncology and Hematology [facility] [place], [place] [id number] Tel. [phone]    Fax. [phone]  *Total Encounter Time as defined by the Centers for Medicare and Medicaid Services includes, in addition to the face-to-face time of a patient visit (documented in the note above) non-face-to-face time: obtaining and reviewing outside history, ordering and reviewing medications, tests or procedures, care coordination (communications with other health care professionals or caregivers) and documentation in the medical record.

## 2022-05-21 NOTE — Assessment & Plan Note (Signed)
No clinical or radiographic signs of recurrence.  She is due 1 more CT chest/abdomen and Pelvis per NCCN guidelines which I ordered for February, 2024.

## 2022-05-21 NOTE — Patient Instructions (Signed)

## 2022-05-21 NOTE — Assessment & Plan Note (Addendum)
Receiving Zometa annually and will repeat bone density testing in 07/2022 at [facility] which is scheduled.    I recommended she continue with her calcium, vitamin d intake, and weight bearing exercises.

## 2022-05-21 NOTE — Assessment & Plan Note (Signed)
Dawn [name] has no clinical or radiographic signs of breast cancer recurrence.  She will continue on anastrozole daily which she tolerates well.  She is due for repeat mammogram in 04/2023.    We discussed her breast density which is category c.  She was curious about breast MRI and I informed her about the FAST MRI screening and she will let me know if she wants me to order this.    We discussed healthy diet, exercise, and keeping up with her preventative health.

## 2022-06-02 ENCOUNTER — Other Ambulatory Visit (HOSPITAL_BASED_OUTPATIENT_CLINIC_OR_DEPARTMENT_OTHER): Payer: Self-pay

## 2022-06-02 MED FILL — Anastrozole Tab 1 MG: ORAL | 90 days supply | Qty: 90 | Fill #1 | Status: AC

## 2022-06-04 ENCOUNTER — Other Ambulatory Visit (HOSPITAL_BASED_OUTPATIENT_CLINIC_OR_DEPARTMENT_OTHER): Payer: Self-pay

## 2022-06-04 MED ORDER — INFLUENZA VAC A&B SA ADJ QUAD 0.5 ML IM PRSY
PREFILLED_SYRINGE | INTRAMUSCULAR | 0 refills | Status: DC
  Filled 2022-06-04: qty 0.5, 1d supply, fill #0

## 2022-06-23 ENCOUNTER — Telehealth: Payer: Self-pay | Admitting: Family Medicine

## 2022-06-23 ENCOUNTER — Other Ambulatory Visit (HOSPITAL_BASED_OUTPATIENT_CLINIC_OR_DEPARTMENT_OTHER): Payer: Self-pay

## 2022-06-23 ENCOUNTER — Other Ambulatory Visit (HOSPITAL_COMMUNITY): Payer: Self-pay

## 2022-06-23 MED FILL — Pantoprazole Sodium EC Tab 20 MG (Base Equiv): ORAL | 90 days supply | Qty: 90 | Fill #1 | Status: AC

## 2022-06-23 NOTE — Telephone Encounter (Signed)
Patient requested labs for appointment on 10/18 be sent in prior to appointment so she can get them drawn prior to appointment. Dawn [name]

## 2022-06-23 NOTE — Telephone Encounter (Signed)
She really doesn;t need labs right now.

## 2022-06-24 NOTE — Telephone Encounter (Signed)
Called patient and notified her that labs are not necessary at this time per Dr Madilyn Fireman. However, patient insists she would still like to come in and have labs drawn where orders are active and were placed 7/24. Dawn [name]

## 2022-06-24 NOTE — Telephone Encounter (Signed)
OK. Thank you

## 2022-07-02 ENCOUNTER — Ambulatory Visit (INDEPENDENT_AMBULATORY_CARE_PROVIDER_SITE_OTHER): Payer: PPO | Admitting: Family Medicine

## 2022-07-02 ENCOUNTER — Other Ambulatory Visit: Payer: PPO

## 2022-07-02 ENCOUNTER — Encounter: Payer: Self-pay | Admitting: Family Medicine

## 2022-07-02 VITALS — BP 126/64 | HR 67 | Ht 63.0 in | Wt 186.0 lb

## 2022-07-02 DIAGNOSIS — I10 Essential (primary) hypertension: Secondary | ICD-10-CM | POA: Diagnosis not present

## 2022-07-02 DIAGNOSIS — R051 Acute cough: Secondary | ICD-10-CM | POA: Diagnosis not present

## 2022-07-02 DIAGNOSIS — R7301 Impaired fasting glucose: Secondary | ICD-10-CM

## 2022-07-02 DIAGNOSIS — I700 Atherosclerosis of aorta: Secondary | ICD-10-CM

## 2022-07-02 DIAGNOSIS — E785 Hyperlipidemia, unspecified: Secondary | ICD-10-CM | POA: Diagnosis not present

## 2022-07-02 DIAGNOSIS — Z23 Encounter for immunization: Secondary | ICD-10-CM | POA: Diagnosis not present

## 2022-07-02 LAB — POCT GLYCOSYLATED HEMOGLOBIN (HGB A1C): Hemoglobin A1C: 5.6 % (ref 4.0–5.6)

## 2022-07-02 NOTE — Patient Instructions (Signed)
Recommend a trial of Claritin at night for the next week just to see if it improves her cough and drainage.

## 2022-07-02 NOTE — Assessment & Plan Note (Signed)
Continue statin daily.  

## 2022-07-02 NOTE — Assessment & Plan Note (Signed)
Well controlled. Continue current regimen. Follow up in  6 mo  

## 2022-07-02 NOTE — Progress Notes (Signed)
Established Patient Office Visit  Subjective   Patient ID: Dawn [name], female    DOB: [birth year]  Age: 71 y.o. MRN: [id number]  Chief Complaint  Patient presents with   Hypertension   ifg    HPI  Hypertension- Pt denies chest pain, SOB, dizziness, or heart palpitations.  Taking meds as directed w/o problems.  Denies medication side effects.    Impaired fasting glucose-no increased thirst or urination. No symptoms consistent with hypoglycemia.  She is doing well with her thyroid she follows with endocrine and they recently checked her levels and they were good.  No recent changes to her medication.  Has had a tickly cough in her throat area for about a month.  But her husband's also been sick for about a month.  He has been more productive.  She says she has not felt bad she has not had any other cold symptoms no fever etc.  Still working part-time about 16 hours/week.  She has been exercising daily she goes to the gym 3 days a week, takes an exercise class once a week, and walks the rest of the days.    ROS    Objective:     BP 126/64   Pulse 67   Ht '5\' 3"'$  (1.6 m)   Wt 186 lb (84.4 kg)   SpO2 98%   BMI 32.95 kg/m    Physical Exam Vitals and nursing note reviewed.  Constitutional:      Appearance: She is well-developed.  HENT:     Head: Normocephalic and atraumatic.     Right Ear: Tympanic membrane, ear canal and external ear normal.     Left Ear: Tympanic membrane, ear canal and external ear normal.     Nose: Nose normal.  Eyes:     Conjunctiva/sclera: Conjunctivae normal.     Pupils: Pupils are equal, round, and reactive to light.  Neck:     Thyroid: No thyromegaly.  Cardiovascular:     Rate and Rhythm: Normal rate and regular rhythm.     Heart sounds: Normal heart sounds.  Pulmonary:     Effort: Pulmonary effort is normal.     Breath sounds: Normal breath sounds. No wheezing.  Musculoskeletal:     Cervical back: Neck supple.  Lymphadenopathy:      Cervical: No cervical adenopathy.  Skin:    General: Skin is warm and dry.  Neurological:     Mental Status: She is alert and oriented to person, place, and time.  Psychiatric:        Behavior: Behavior normal.      Results for orders placed or performed in visit on 07/02/22  POCT glycosylated hemoglobin (Hb A1C)  Result Value Ref Range   Hemoglobin A1C 5.6 4.0 - 5.6 %   HbA1c POC (<> result, manual entry)     HbA1c, POC (prediabetic range)     HbA1c, POC (controlled diabetic range)        The 10-year ASCVD risk score (Arnett DK, et al., 2019) is: 12%    Assessment & Plan:   Problem List Items Addressed This Visit       Cardiovascular and Mediastinum   HYPERTENSION, BENIGN - Primary    Well controlled. Continue current regimen. Follow up in  6 mo       Aortic atherosclerosis (HCC)    Continue statin daily         Endocrine   IFG (impaired fasting glucose)    Well controlled. Continue current  regimen. Follow up in  46mo      Relevant Orders   POCT glycosylated hemoglobin (Hb A1C) (Completed)   Other Visit Diagnoses     Need for COVID-19 vaccine       Relevant Orders   Pfizer Fall 2023 Covid-19 Vaccine 170yrand older (Completed)   Acute cough          Cough-discussed most likely postnasal drainage.  No evidence of active infection on exam today.  Recommend a trial of Claritin at night.  She already uses Flonase regularly.  If not improving over the next couple of weeks then please let me know.  Return in about 6 months (around 01/01/2023) for Hypertension.    CaBeatrice LecherMD

## 2022-07-03 LAB — LIPID PANEL
Cholesterol: 168 mg/dL (ref <200)
HDL: 55 mg/dL (ref >=50)
LDL Cholesterol (Calc): 87 mg/dL (calc)
Non-HDL Cholesterol (Calc): 113 mg/dL (calc) (ref <130)
Total CHOL/HDL Ratio: 3.1 (calc) (ref <5.0)
Triglycerides: 164 mg/dL — ABNORMAL HIGH (ref <150)

## 2022-07-03 NOTE — Progress Notes (Signed)
Binnie, LDL cholesterol is better this time!  Great work and bringing that down.

## 2022-07-05 ENCOUNTER — Other Ambulatory Visit (HOSPITAL_COMMUNITY): Payer: Self-pay

## 2022-07-29 DIAGNOSIS — H903 Sensorineural hearing loss, bilateral: Secondary | ICD-10-CM | POA: Diagnosis not present

## 2022-08-04 ENCOUNTER — Other Ambulatory Visit (HOSPITAL_COMMUNITY): Payer: Self-pay

## 2022-08-04 ENCOUNTER — Encounter: Payer: Self-pay | Admitting: Family Medicine

## 2022-08-04 MED FILL — Potassium Chloride Microencapsulated Crys ER Tab 20 mEq: ORAL | 90 days supply | Qty: 90 | Fill #3 | Status: AC

## 2022-08-05 ENCOUNTER — Other Ambulatory Visit (HOSPITAL_BASED_OUTPATIENT_CLINIC_OR_DEPARTMENT_OTHER): Payer: Self-pay

## 2022-08-05 ENCOUNTER — Ambulatory Visit (INDEPENDENT_AMBULATORY_CARE_PROVIDER_SITE_OTHER): Payer: PPO | Admitting: Family Medicine

## 2022-08-05 ENCOUNTER — Encounter: Payer: Self-pay | Admitting: Family Medicine

## 2022-08-05 ENCOUNTER — Other Ambulatory Visit (HOSPITAL_COMMUNITY): Payer: Self-pay

## 2022-08-05 VITALS — BP 129/58 | HR 71 | Ht 63.0 in | Wt 188.0 lb

## 2022-08-05 DIAGNOSIS — R21 Rash and other nonspecific skin eruption: Secondary | ICD-10-CM | POA: Diagnosis not present

## 2022-08-05 MED ORDER — TRIAMCINOLONE ACETONIDE 0.1 % EX CREA
1.0000 | TOPICAL_CREAM | Freq: Two times a day (BID) | CUTANEOUS | 1 refills | Status: DC
  Filled 2022-08-05: qty 30, 15d supply, fill #0
  Filled 2022-08-05: qty 30, 30d supply, fill #0
  Filled 2022-08-05: qty 30, 15d supply, fill #0
  Filled 2022-08-15 – 2022-08-18 (×2): qty 30, 15d supply, fill #1

## 2022-08-05 MED ORDER — PREDNISONE 20 MG PO TABS
20.0000 mg | ORAL_TABLET | Freq: Two times a day (BID) | ORAL | 0 refills | Status: AC
  Filled 2022-08-05 (×3): qty 10, 5d supply, fill #0

## 2022-08-05 NOTE — Progress Notes (Signed)
Acute Office Visit  Subjective:     Patient ID: Dawn [name], female    DOB: November 14, 1950, 71 y.o.   MRN: [id number]  Chief Complaint  Patient presents with   Rash    HPI Patient is in today for rash on abdomen that extends to the groin area. Admits to itchiness. She tried hydrocortisone cream and Claritin daily to see if this helps.   Had cancer in 2013 and treated with Keytruda. She has side effects of bumps. She has not had any issues with it lately.   Review of Systems  Constitutional:  Negative for chills and fever.  Respiratory:  Negative for cough and shortness of breath.   Cardiovascular:  Negative for chest pain.  Skin:  Positive for rash.  Neurological:  Negative for headaches.        Objective:    BP (!) 129/58   Pulse 71   Ht '5\' 3"'$  (1.6 m)   Wt 188 lb (85.3 kg)   SpO2 99%   BMI 33.30 kg/m    Physical Exam Vitals and nursing note reviewed.  Constitutional:      General: She is not in acute distress.    Appearance: Normal appearance.  HENT:     Head: Normocephalic and atraumatic.     Right Ear: External ear normal.     Left Ear: External ear normal.     Nose: Nose normal.  Eyes:     Conjunctiva/sclera: Conjunctivae normal.  Cardiovascular:     Rate and Rhythm: Normal rate and regular rhythm.  Pulmonary:     Effort: Pulmonary effort is normal.     Breath sounds: Normal breath sounds.  Abdominal:     Comments: Chaperone present as rash is in the lower pelvic area. No tenderness to palpation.  Some erythema on exam with small patches. No beefy red color.   Neurological:     General: No focal deficit present.     Mental Status: She is alert and oriented to person, place, and time.  Psychiatric:        Mood and Affect: Mood normal.        Behavior: Behavior normal.        Thought Content: Thought content normal.        Judgment: Judgment normal.     No results found for any visits on 08/05/22.      Assessment & Plan:   Problem List  Items Addressed This Visit       Musculoskeletal and Integument   Rash - Primary    - some erythema on exam with patient having concerns of pruritus  - will go ahead and send in prednisone '40mg'$  for 5 days to knock down inflammatory process - have also given triamcinolone cream  - follow up in one week if no better      Relevant Medications   predniSONE (DELTASONE) 20 MG tablet   triamcinolone cream (KENALOG) 0.1 %    Meds ordered this encounter  Medications   predniSONE (DELTASONE) 20 MG tablet    Sig: Take 1 tablet (20 mg total) by mouth 2 (two) times daily with a meal for 5 days.    Dispense:  10 tablet    Refill:  0   triamcinolone cream (KENALOG) 0.1 %    Sig: Apply 1 Application topically 2 (two) times daily.    Dispense:  30 g    Refill:  1    Return if symptoms worsen or fail to improve.  Owens Loffler, DO

## 2022-08-05 NOTE — Assessment & Plan Note (Signed)
-   some erythema on exam with patient having concerns of pruritus  - will go ahead and send in prednisone '40mg'$  for 5 days to knock down inflammatory process - have also given triamcinolone cream  - follow up in one week if no better

## 2022-08-06 ENCOUNTER — Telehealth: Payer: Self-pay

## 2022-08-06 DIAGNOSIS — Z780 Asymptomatic menopausal state: Secondary | ICD-10-CM | POA: Diagnosis not present

## 2022-08-06 DIAGNOSIS — M8589 Other specified disorders of bone density and structure, multiple sites: Secondary | ICD-10-CM | POA: Diagnosis not present

## 2022-08-06 NOTE — Telephone Encounter (Signed)
Gave patient results of bone density test: Patient has Osteopenia at multiple sites and follow up with another test in two years, patient taking Vit D and Vit C at this time per [name] continue current meds.

## 2022-08-12 ENCOUNTER — Encounter: Payer: Self-pay | Admitting: Hematology

## 2022-08-15 ENCOUNTER — Other Ambulatory Visit (HOSPITAL_COMMUNITY): Payer: Self-pay

## 2022-08-18 ENCOUNTER — Other Ambulatory Visit (HOSPITAL_COMMUNITY): Payer: Self-pay

## 2022-08-18 MED FILL — Anastrozole Tab 1 MG: ORAL | 90 days supply | Qty: 90 | Fill #2 | Status: AC

## 2022-08-19 ENCOUNTER — Other Ambulatory Visit (HOSPITAL_COMMUNITY): Payer: Self-pay

## 2022-08-20 ENCOUNTER — Other Ambulatory Visit (HOSPITAL_COMMUNITY): Payer: Self-pay

## 2022-09-17 ENCOUNTER — Other Ambulatory Visit: Payer: Self-pay | Admitting: Gastroenterology

## 2022-09-18 ENCOUNTER — Other Ambulatory Visit (HOSPITAL_COMMUNITY): Payer: Self-pay

## 2022-09-18 ENCOUNTER — Other Ambulatory Visit: Payer: Self-pay

## 2022-09-18 MED ORDER — PANTOPRAZOLE SODIUM 20 MG PO TBEC
DELAYED_RELEASE_TABLET | ORAL | 0 refills | Status: DC
  Filled 2022-09-18: qty 90, 90d supply, fill #0

## 2022-09-18 NOTE — Telephone Encounter (Signed)
This Is a patient of Dr Ardis Hughs.  Please advise refills as you are DOD am.  Thank you

## 2022-09-18 NOTE — Telephone Encounter (Signed)
Ok to refill 

## 2022-09-19 ENCOUNTER — Other Ambulatory Visit (HOSPITAL_BASED_OUTPATIENT_CLINIC_OR_DEPARTMENT_OTHER): Payer: Self-pay

## 2022-09-19 MED ORDER — AREXVY 120 MCG/0.5ML IM SUSR
INTRAMUSCULAR | 0 refills | Status: DC
Start: 2022-09-19 — End: 2023-01-05
  Filled 2022-09-19: qty 0.5, 1d supply, fill #0

## 2022-09-23 ENCOUNTER — Encounter: Payer: Self-pay | Admitting: Hematology

## 2022-10-28 ENCOUNTER — Other Ambulatory Visit: Payer: Self-pay | Admitting: Hematology

## 2022-10-29 ENCOUNTER — Encounter: Payer: Self-pay | Admitting: Adult Health

## 2022-11-04 ENCOUNTER — Other Ambulatory Visit (HOSPITAL_COMMUNITY): Payer: Self-pay

## 2022-11-14 ENCOUNTER — Other Ambulatory Visit: Payer: Self-pay

## 2022-11-14 DIAGNOSIS — Z170 Estrogen receptor positive status [ER+]: Secondary | ICD-10-CM

## 2022-11-15 ENCOUNTER — Other Ambulatory Visit: Payer: Self-pay | Admitting: Hematology

## 2022-11-15 DIAGNOSIS — C50211 Malignant neoplasm of upper-inner quadrant of right female breast: Secondary | ICD-10-CM

## 2022-11-15 DIAGNOSIS — Z170 Estrogen receptor positive status [ER+]: Secondary | ICD-10-CM

## 2022-11-17 ENCOUNTER — Ambulatory Visit (HOSPITAL_COMMUNITY): Payer: PPO

## 2022-11-17 ENCOUNTER — Inpatient Hospital Stay: Payer: PPO

## 2022-11-17 ENCOUNTER — Other Ambulatory Visit: Payer: Self-pay

## 2022-11-17 ENCOUNTER — Other Ambulatory Visit (HOSPITAL_COMMUNITY): Payer: Self-pay

## 2022-11-17 MED ORDER — ANASTROZOLE 1 MG PO TABS
1.0000 mg | ORAL_TABLET | Freq: Every day | ORAL | 3 refills | Status: DC
  Filled 2022-11-17: qty 90, 90d supply, fill #0
  Filled 2023-02-10: qty 90, 90d supply, fill #1
  Filled 2023-05-11: qty 90, 90d supply, fill #2
  Filled 2023-08-09: qty 90, 90d supply, fill #3

## 2022-11-18 NOTE — Progress Notes (Signed)
Patient canceled CT scan yesterday due to not feeling well per Maryclare Labrador in CT imaging. Followed up with patient, she states she has a cough and feels congested and did not want to bring that around anyone. This LPN gave her the central scheduling # [phone] for her to reschedule her CT when she feels better. Patient verbalized understanding.

## 2022-11-19 ENCOUNTER — Encounter: Payer: Self-pay | Admitting: Family Medicine

## 2022-11-19 ENCOUNTER — Ambulatory Visit: Payer: PPO | Admitting: Hematology

## 2022-11-19 ENCOUNTER — Other Ambulatory Visit: Payer: PPO

## 2022-11-19 ENCOUNTER — Other Ambulatory Visit (HOSPITAL_BASED_OUTPATIENT_CLINIC_OR_DEPARTMENT_OTHER): Payer: Self-pay

## 2022-11-19 ENCOUNTER — Ambulatory Visit (INDEPENDENT_AMBULATORY_CARE_PROVIDER_SITE_OTHER): Payer: PPO | Admitting: Family Medicine

## 2022-11-19 VITALS — BP 140/84 | HR 87 | Temp 97.9°F | Ht 63.0 in | Wt 190.0 lb

## 2022-11-19 DIAGNOSIS — J069 Acute upper respiratory infection, unspecified: Secondary | ICD-10-CM

## 2022-11-19 DIAGNOSIS — G4483 Primary cough headache: Secondary | ICD-10-CM | POA: Diagnosis not present

## 2022-11-19 LAB — POCT INFLUENZA A/B
Influenza A, POC: NEGATIVE
Influenza B, POC: NEGATIVE

## 2022-11-19 LAB — POC COVID19 BINAXNOW: SARS Coronavirus 2 Ag: NEGATIVE

## 2022-11-19 MED ORDER — PREDNISONE 20 MG PO TABS
40.0000 mg | ORAL_TABLET | Freq: Every day | ORAL | 0 refills | Status: DC
  Filled 2022-11-19: qty 10, 5d supply, fill #0

## 2022-11-19 NOTE — Progress Notes (Signed)
   Acute Office Visit  Subjective:     Patient ID: Dawn [name], female    DOB: 1951-01-06, 72 y.o.   MRN: [id number]  Chief Complaint  Patient presents with   Cough   Nasal Congestion   Headache    Fever on and off for 5 days    HPI Patient is in today for fever, cough, congestionx 5 days.  It started on Saturday.  No ear pain.  She has been taking Tylenol, Mucinex and a cough suppressant.  The cough has been waking her up at night.  ROS      Objective:    BP (!) 140/84 (BP Location: Left Arm, Patient Position: Sitting, Cuff Size: Normal)   Pulse 87   Temp 97.9 F (36.6 C) (Oral)   Ht '5\' 3"'$  (1.6 m)   Wt 190 lb 0.6 oz (86.2 kg)   SpO2 96%   BMI 33.66 kg/m    Physical Exam Constitutional:      Appearance: She is well-developed.  HENT:     Head: Normocephalic and atraumatic.     Right Ear: External ear normal.     Left Ear: External ear normal.     Nose: Nose normal.  Eyes:     Conjunctiva/sclera: Conjunctivae normal.     Pupils: Pupils are equal, round, and reactive to light.  Neck:     Thyroid: No thyromegaly.  Cardiovascular:     Rate and Rhythm: Normal rate and regular rhythm.     Heart sounds: Normal heart sounds.  Pulmonary:     Effort: Pulmonary effort is normal.     Breath sounds: Normal breath sounds. No wheezing.  Musculoskeletal:     Cervical back: Neck supple.  Lymphadenopathy:     Cervical: No cervical adenopathy.  Skin:    General: Skin is warm and dry.  Neurological:     Mental Status: She is alert and oriented to person, place, and time.     Results for orders placed or performed in visit on 11/19/22  POCT Influenza A/B  Result Value Ref Range   Influenza A, POC Negative Negative   Influenza B, POC Negative Negative  POC COVID-19  Result Value Ref Range   SARS Coronavirus 2 Ag Negative Negative        Assessment & Plan:   Problem List Items Addressed This Visit   None Visit Diagnoses     Cough headache syndrome    -   Primary   Relevant Orders   POCT Influenza A/B (Completed)   POC COVID-19 (Completed)   Viral upper respiratory tract infection          URI -   negative for flu and for COVID.  Most consistent with viral illness.  Recommend continue with symptomatic care.  We did discuss doing maybe a trial of prednisone to see if that would help with the cough and her chest symptoms if she is not better after the weekend then we can consider an antibiotic if needed at that point.   Meds ordered this encounter  Medications   predniSONE (DELTASONE) 20 MG tablet    Sig: Take 2 tablets (40 mg total) by mouth daily with breakfast.    Dispense:  10 tablet    Refill:  0    No follow-ups on file.  Beatrice Lecher, MD

## 2022-11-24 ENCOUNTER — Telehealth: Payer: Self-pay | Admitting: Family Medicine

## 2022-11-24 ENCOUNTER — Other Ambulatory Visit (HOSPITAL_COMMUNITY): Payer: Self-pay

## 2022-11-24 ENCOUNTER — Ambulatory Visit (INDEPENDENT_AMBULATORY_CARE_PROVIDER_SITE_OTHER): Payer: PPO | Admitting: Family Medicine

## 2022-11-24 ENCOUNTER — Telehealth: Payer: Self-pay

## 2022-11-24 ENCOUNTER — Telehealth: Payer: Self-pay | Admitting: *Deleted

## 2022-11-24 DIAGNOSIS — Z0000 Encounter for general adult medical examination without abnormal findings: Secondary | ICD-10-CM | POA: Diagnosis not present

## 2022-11-24 MED ORDER — AZITHROMYCIN 250 MG PO TABS
ORAL_TABLET | ORAL | 0 refills | Status: AC
  Filled 2022-11-24: qty 6, 5d supply, fill #0

## 2022-11-24 NOTE — Telephone Encounter (Signed)
Patient advised.

## 2022-11-24 NOTE — Progress Notes (Signed)
MEDICARE ANNUAL WELLNESS VISIT  11/24/2022  Telephone Visit Disclaimer This Medicare AWV was conducted by telephone due to national recommendations for restrictions regarding the COVID-19 Pandemic (e.g. social distancing).  I verified, using two identifiers, that I am speaking with Dawn [name] or their authorized healthcare agent. I discussed the limitations, risks, security, and privacy concerns of performing an evaluation and management service by telephone and the potential availability of an in-person appointment in the future. The patient expressed understanding and agreed to proceed.  Location of Patient: Home Location of Provider (nurse):  In the office.  Subjective:    Dawn [name] is a 72 y.o. female patient of Metheney, Rene Kocher, MD who had a Medicare Annual Wellness Visit today via telephone. Dawn [name] is still working part time and lives with their spouse. she has 2 children. she reports that she is socially active and does interact with friends/family regularly. she is moderately physically active and enjoys  hiking, walking and gardening.  Patient Care Team: [name], MD as PCP - General (Family Medicine) Excell Seltzer, MD (Inactive) as Consulting Physician (General Surgery) Truitt Merle, MD as Consulting Physician (Hematology) [name], MD as Attending Physician (Radiation Oncology)     11/24/2022    2:20 PM 05/21/2022    8:51 AM 11/20/2021    1:58 PM 11/13/2021   11:15 AM 04/16/2021    1:04 PM 04/08/2021   10:13 AM 10/24/2020    9:08 AM  Advanced Directives  Does Patient Have a Medical Advance Directive? No Yes Yes Yes Yes Yes Yes  Type of Advance Directive   Living will;Healthcare Power of Attorney  Living will Living will [place];Living will  Does patient want to make changes to medical advance directive?   No - Patient declined No - Patient declined No - Patient declined  No - Patient declined  Copy of [place] in Chart?  No - copy requested No - copy requested  No - copy requested No - copy requested Yes - validated most recent copy scanned in chart (See row information)  Would patient like information on creating a medical advance directive? No - Patient declined          Hospital Utilization Over the Past 12 Months: # of hospitalizations or ER visits: 0 # of surgeries: 0  Review of Systems    Patient reports that her overall health is better compared to last year.  History obtained from chart review and the patient  Patient Reported Readings (BP, Pulse, CBG, Weight, etc) none  Pain Assessment Pain : No/denies pain     Current Medications & Allergies (verified) Allergies as of 11/24/2022       Reactions   Ciprofloxacin Hives   Paclitaxel Rash   Crestor [rosuvastatin Calcium] Other (See Comments)   Myalgias   Fosamax [alendronate Sodium] Other (See Comments)   reflux   Livalo [pitavastatin] Other (See Comments)   Difficulty walking. Weakness in legs.    Penicillins Rash   Has patient had a PCN reaction causing immediate rash, facial/tongue/throat swelling, SOB or lightheadedness with hypotension: Yes Has patient had a PCN reaction causing severe rash involving mucus membranes or skin necrosis: Yes Has patient had a PCN reaction that required hospitalization: No Has patient had a PCN reaction occurring within the last 10 years: No If all of the above answers are "NO", then may proceed with Cephalosporin use.        Medication List  Accurate as of November 24, 2022  2:26 PM. If you have any questions, ask your nurse or doctor.          acetaminophen 500 MG tablet Commonly known as: TYLENOL Take 500 mg by mouth every 6 (six) hours as needed for moderate pain.   Alpha-Lipoic Acid 600 MG Caps Take 1 capsule (600 mg total) by mouth daily.   anastrozole 1 MG tablet Commonly known as: ARIMIDEX Take 1 tablet (1 mg total) by mouth daily.   Arexvy 120  MCG/0.5ML injection Generic drug: RSV vaccine recomb adjuvanted Inject into the muscle.   atorvastatin 20 MG tablet Commonly known as: LIPITOR Take 1 tablet (20 mg total) by mouth daily.   b complex vitamins capsule Take 1 capsule by mouth daily.   fluticasone 50 MCG/ACT nasal spray Commonly known as: FLONASE Place 2 sprays into both nostrils daily.   levothyroxine 50 MCG tablet Commonly known as: SYNTHROID TAKE 1 TABLET BY MOUTH  DAILY   multivitamin capsule Take 1 capsule by mouth daily.   olmesartan-hydrochlorothiazide 20-12.5 MG tablet Commonly known as: BENICAR HCT TAKE 1 TABLET BY MOUTH  DAILY   pantoprazole 20 MG tablet Commonly known as: PROTONIX TAKE 1 TABLET BY MOUTH  DAILY--need follow up appointment for further refills [phone]   potassium chloride SA 20 MEQ tablet Commonly known as: KLOR-CON M TAKE 1 TABLET BY MOUTH  DAILY   predniSONE 20 MG tablet Commonly known as: DELTASONE Take 2 tablets (40 mg total) by mouth daily with breakfast.   pyridOXINE 50 MG tablet Commonly known as: VITAMIN B6 Take 1 tablet (50 mg total) by mouth daily.   triamcinolone cream 0.1 % Commonly known as: KENALOG Apply 1 application topically 2 (two) times daily.   vitamin C 1000 MG tablet Take 1,000 mg by mouth daily.   Vitamin D3 75 MCG (3000 UT) Tabs Generic drug: Cholecalciferol Take 1 capsule by mouth daily.        History (reviewed): Past Medical History:  Diagnosis Date   Allergy 2018   Anemia    as a child.   Arthritis    Atypical mole 06/04/2018   moderate top of right foot   Atypical mole 06/04/2018   left elbow mild   Atypical mole 06/04/2018   left upper back mild   Bilateral cataracts    Bilateral leg cramps    Cancer ([place])    skin   Dyspnea    Endometrial cancer ([place])    Family history of bladder cancer    Family history of colon cancer in father    Fatty liver    Fuchs' corneal dystrophy    GERD (gastroesophageal reflux disease)     Hearing loss    Right side 30%   Heart murmur 1960   Heartburn    History of hiatal hernia    small size   History of radiation therapy 08/21/17, 08/28/17,09/01/17, 09/11/17, 09/17/17   Vaginal cuff brachytherapy.    History of radiation therapy 11/11/17- 12/08/17   Right Breast treated to 40.05 Gy with 15 fx of 2.67 Gy and a boost of 10 Gy with 5 fx.    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   Uterine cancer ([place]) 04/2017   endometrial cancer   Varices, gastric    Vertigo    Past Surgical History:  Procedure Laterality Date   ABDOMINAL HYSTERECTOMY  2018   BREAST BIOPSY Right 04/27/2017   BREAST LUMPECTOMY WITH RADIOACTIVE SEED AND SENTINEL LYMPH NODE BIOPSY Right 05/25/2017   Procedure: RIGHT BREAST LUMPECTOMY WITH RADIOACTIVE SEED AND  RIGHT AXILLARY SENTINEL LYMPH NODE BIOPSY;  Surgeon: Excell Seltzer, MD;  Location: [facility];  Service: General;  Laterality: Right;   CATARACT EXTRACTION, BILATERAL     COLONOSCOPY     EYE SURGERY  2018   HYSTEROSCOPY WITH D & C N/A 04/29/2017   Procedure: DILATATION AND CURETTAGE /HYSTEROSCOPY;  Surgeon: Linda Hedges, DO;  Location: [facility];  Service: Gynecology;  Laterality: N/A;   PORT-A-CATH REMOVAL Left 04/16/2021   Procedure: REMOVAL PORT-A-CATH;  Surgeon: Johnathan Hausen, MD;  Location: [facility];  Service: General;  Laterality: Left;   PORTACATH PLACEMENT Left 05/25/2017   Procedure: INSERTION PORT-A-CATH;  Surgeon: Excell Seltzer, MD;  Location: [facility];  Service: General;  Laterality: Left;   ROBOTIC  ASSISTED TOTAL HYSTERECTOMY WITH BILATERAL SALPINGO OOPHERECTOMY N/A 06/09/2017   Procedure: XI ROBOTIC ASSISTED TOTAL LAPOROSCOPIC HYSTERECTOMY WITH BILATERAL SALPINGO OOPHORECTOMY;  Surgeon: Everitt Amber, MD;  Location: [facility];  Service: Gynecology;  Laterality: N/A;   SENTINEL NODE BIOPSY N/A 06/09/2017   Procedure: SENTINEL NODE BIOPSY;  Surgeon: Everitt Amber, MD;  Location: [facility];  Service: Gynecology;  Laterality: N/A;   Family History  Problem Relation Age of Onset   Colon cancer Father 54   Heart attack Father 4   Prostate cancer Father        dx 6's   Bladder Cancer Father 31   Hypertension Father    Hyperlipidemia Father    Heart disease Father    Obesity Father    Stroke Mother 99   Depression Mother    Anxiety disorder Mother    Bipolar disorder Mother    Obesity Mother    Diabetes Maternal Grandfather    Kidney disease Maternal Grandfather    Asthma Brother    Cancer Paternal Grandmother 52       GYN cancer ( thinks ovarian, maybe uterine)   Heart attack Maternal Grandmother 70   Heart disease Maternal Grandmother    Breast cancer Other 13   Colon cancer Other 13   Cancer Other        type unk, age dx unk   Anxiety disorder Daughter    Anxiety disorder Son    Hyperlipidemia Brother    Hyperlipidemia Sister    Social History   Socioeconomic History   Marital status: Married    Spouse name: Simona Huh   Number of children: 2   Years of education: 16   Highest education level: Bachelor's degree (e.g., BA, AB, BS)  Occupational History    Employer: [employer]    Comment: [employer]   Tobacco Use   Smoking status: Never   Smokeless tobacco: Never  Vaping Use   Vaping Use: Never used  Substance and Sexual Activity   Alcohol use: Not Currently    Comment: <1/week wine or beer occasional   Drug use: No   Sexual activity: Yes    Birth control/protection: Post-menopausal  Other  Topics Concern   Not on file  Social History  Narrative   Lives with her husband. Still works part time. She enjoys hiking, walking and gardening.     Social Determinants of Health   Financial Resource Strain: Low Risk  (11/20/2022)   Overall Financial Resource Strain (CARDIA)    Difficulty of Paying Living Expenses: Not hard at all  Food Insecurity: No Food Insecurity (11/20/2022)   Hunger Vital Sign    Worried About Running Out of Food in the Last Year: Never true    Ran Out of Food in the Last Year: Never true  Transportation Needs: No Transportation Needs (11/20/2022)   PRAPARE - Hydrologist (Medical): No    Lack of Transportation (Non-Medical): No  Physical Activity: Sufficiently Active (11/20/2022)   Exercise Vital Sign    Days of Exercise per Week: 4 days    Minutes of Exercise per Session: 60 min  Stress: No Stress Concern Present (11/20/2022)   [place]    Feeling of Stress : Only a little  Social Connections: Socially Integrated (11/24/2022)   Social Connection and Isolation Panel [NHANES]    Frequency of Communication with Friends and Family: Three times a week    Frequency of Social Gatherings with Friends and Family: Once a week    Attends Religious Services: More than 4 times per year    Active Member of [employer] or Organizations: Yes    Attends Archivist Meetings: Never    Marital Status: Married    Activities of Daily Living    11/24/2022    2:20 PM 11/20/2022    7:01 PM  In your present state of health, do you have any difficulty performing the following activities:  Hearing?  0  Comment right ear hearing aid   Vision?  0  Difficulty concentrating or making decisions?  0  Walking or climbing stairs?  0  Dressing or bathing?  0  Doing errands, shopping?  0  Preparing Food and eating ?  N  Using the Toilet?  N  In the past six months, have you accidently leaked urine?  N  Do you have problems with loss of  bowel control?  N  Managing your Medications? N   Managing your Finances?  N  Housekeeping or managing your Housekeeping?  N    Patient Education/ Literacy How often do you need to have someone help you when you read instructions, pamphlets, or other written materials from your doctor or pharmacy?: 1 - Never What is the last grade level you completed in school?: Bachelor's degree  Exercise Current Exercise Habits: Home exercise routine, Type of exercise: strength training/weights;walking;Other - see comments (stationary bike), Time (Minutes): 60, Frequency (Times/Week): 4, Weekly Exercise (Minutes/Week): 240, Intensity: Moderate, Exercise limited by: None identified  Diet Patient reports consuming 3 meals a day and 1 snack(s) a day Patient reports that her primary diet is: Regular Patient reports that she does have regular access to food.   Depression Screen    11/24/2022    2:18 PM 11/19/2022   11:04 AM 07/02/2022    9:13 AM 12/30/2021    8:34 AM 11/20/2021    1:55 PM 07/08/2021   10:44 AM 06/08/2020    2:25 PM  PHQ 2/9 Scores  PHQ - 2 Score 1 0 0 0 0 0 0     Fall Risk    11/24/2022    2:17 PM  11/20/2022    7:01 PM 11/19/2022   11:04 AM 07/02/2022    9:13 AM 12/30/2021    8:34 AM  Fall Risk   Falls in the past year? 0 0 0 0 0  Number falls in past yr: 0 0 0 0 0  Injury with Fall? 0 0 0 0 0  Risk for fall due to : No Fall Risks  No Fall Risks No Fall Risks No Fall Risks  Follow up Falls evaluation completed  Falls evaluation completed Falls evaluation completed Falls prevention discussed;Falls evaluation completed     Objective:  Dawn [name] seemed alert and oriented and she participated appropriately during our telephone visit.  Blood Pressure Weight BMI  BP Readings from Last 3 Encounters:  11/19/22 (!) 140/84  08/05/22 (!) 129/58  07/02/22 126/64   Wt Readings from Last 3 Encounters:  11/19/22 190 lb 0.6 oz (86.2 kg)  08/05/22 188 lb (85.3 kg)  07/02/22 186 lb  (84.4 kg)   BMI Readings from Last 1 Encounters:  11/19/22 33.66 kg/m    *Unable to obtain current vital signs, weight, and BMI due to telephone visit type  Hearing/Vision  Dawn [name] did not seem to have difficulty with hearing/understanding during the telephone conversation Reports that she has had a formal eye exam by an eye care professional within the past year Reports that she has had a formal hearing evaluation within the past year *Unable to fully assess hearing and vision during telephone visit type  Cognitive Function:    11/24/2022    2:22 PM 11/20/2021    2:00 PM 06/08/2020    2:26 PM  6CIT Screen  What Year? 0 points 0 points 0 points  What month? 0 points 0 points 0 points  What time? 0 points 0 points 0 points  Count back from 20 0 points 0 points 0 points  Months in reverse 0 points 0 points 0 points  Repeat phrase 0 points 0 points 2 points  Total Score 0 points 0 points 2 points   (Normal:0-7, Significant for Dysfunction: >8)  Normal Cognitive Function Screening: Yes   Immunization & Health Maintenance Record Immunization History  Administered Date(s) Administered   COVID-19, mRNA, vaccine(Comirnaty)12 years and older 07/02/2022   Fluad Quad(high Dose 65+) 06/15/2019, 06/20/2020, 07/08/2021, 06/04/2022   Influenza Split 06/15/2012   Influenza Whole 06/26/2009, 05/18/2013   Influenza,inj,Quad PF,6+ Mos 06/02/2014, 06/04/2016, 06/02/2018   Influenza-Unspecified 06/15/2017   PFIZER(Purple Top)SARS-COV-2 Vaccination 09/20/2019, 10/11/2019, 06/12/2020   PNEUMOCOCCAL CONJUGATE-20 07/31/2021   Pfizer Covid-19 Vaccine Bivalent Booster 33yr & up 06/12/2021   Pneumococcal Conjugate-13 06/29/2017   Respiratory Syncytial Virus Vaccine,Recomb Aduvanted(Arexvy) 09/19/2022   Td 12/14/2008   Tdap 03/25/2019   Zoster Recombinat (Shingrix) 02/07/2022, 04/11/2022   Zoster, Live 04/04/2011    Health Maintenance  Topic Date Due   COVID-19 Vaccine (6 - 2023-24 season)  12/05/2022 (Originally 08/27/2022)   MAMMOGRAM  05/14/2023   Medicare Annual Wellness (AWV)  11/24/2023   DEXA SCAN  08/06/2024   COLONOSCOPY (Pts 45-465yrInsurance coverage will need to be confirmed)  11/21/2024   DTaP/Tdap/Td (3 - Td or Tdap) 03/24/2029   Pneumonia Vaccine 6564Years old  Completed   INFLUENZA VACCINE  Completed   Hepatitis C Screening  Completed   Zoster Vaccines- Shingrix  Completed   HPV VACCINES  Aged Out       Assessment  This is a routine wellness examination for Dawn [name] Health Maintenance: Due or Overdue There are no  preventive care reminders to display for this patient.   Dawn [name] does not need a referral for Community Assistance: Care Management:   no Social Work:    no Prescription Assistance:  no Nutrition/Diabetes Education:  no   Plan:  Personalized Goals  Goals Addressed               This Visit's Progress     Patient Stated (pt-stated)        Patient stated that she would like to loose 20 lbs.       Personalized Health Maintenance & Screening Recommendations  Screening mammography - due in August  Lung Cancer Screening Recommended: no (Low Dose CT Chest recommended if Age 47-80 years, 30 pack-year currently smoking OR have quit w/in past 15 years) Hepatitis C Screening recommended: no HIV Screening recommended: no  Advanced Directives: Written information was not prepared per patient's request.  Referrals & Orders No orders of the defined types were placed in this encounter.   Follow-up Plan Follow-up with [name], MD as planned Medicare wellness visit in one year. Patient will access AVS on mychart.   I have personally reviewed and noted the following in the patient's chart:   Medical and social history Use of alcohol, tobacco or illicit drugs  Current medications and supplements Functional ability and status Nutritional status Physical activity Advanced directives List of other  physicians Hospitalizations, surgeries, and ER visits in previous 12 months Vitals Screenings to include cognitive, depression, and falls Referrals and appointments  In addition, I have reviewed and discussed with Dawn [name] certain preventive protocols, quality metrics, and best practice recommendations. A written personalized care plan for preventive services as well as general preventive health recommendations is available and can be mailed to the patient at her request.      Tinnie Gens, RN BSN  11/24/2022

## 2022-11-24 NOTE — Telephone Encounter (Signed)
Pt called for lab appt prior to 3/27 CT CAP. She is aware per MD she will not need PO contrast because she does not meet criteria for radiology to give PO contrast.   Attempted to call pt to make her aware of this, as she was asking why she would not need PO contrast. LVM with reason and instructions to call back with further questions.

## 2022-11-24 NOTE — Telephone Encounter (Signed)
Pt had MWE today with [place] and was advised per her last OV to inform [name] of her current status.   She continues to have congestion,headache,cough. Denies fever/chills. She has completed prednisone x 5 days and took covid test this was negative.   Pt was advised to let [name] know about her sxs. Will fwd to pcp for advice.

## 2022-11-24 NOTE — Telephone Encounter (Signed)
Per 3/11 IB reached out to patient to answer questions about upcoming appointments.

## 2022-11-24 NOTE — Patient Instructions (Addendum)
[facility] Maintenance Summary and Written Plan of Care  Ms. Hartong ,  Thank you for allowing me to perform your Medicare Annual Wellness Visit and for your ongoing commitment to your health.   Health Maintenance & Immunization History Health Maintenance  Topic Date Due   COVID-19 Vaccine (6 - 2023-24 season) 12/05/2022 (Originally 08/27/2022)   MAMMOGRAM  05/14/2023   Medicare Annual Wellness (AWV)  11/24/2023   DEXA SCAN  08/06/2024   COLONOSCOPY (Pts 45-70yr Insurance coverage will need to be confirmed)  11/21/2024   DTaP/Tdap/Td (3 - Td or Tdap) 03/24/2029   Pneumonia Vaccine 71 Years old  Completed   INFLUENZA VACCINE  Completed   Hepatitis C Screening  Completed   Zoster Vaccines- Shingrix  Completed   HPV VACCINES  Aged Out   Immunization History  Administered Date(s) Administered   COVID-19, mRNA, vaccine(Comirnaty)12 years and older 07/02/2022   Fluad Quad(high Dose 65+) 06/15/2019, 06/20/2020, 07/08/2021, 06/04/2022   Influenza Split 06/15/2012   Influenza Whole 06/26/2009, 05/18/2013   Influenza,inj,Quad PF,6+ Mos 06/02/2014, 06/04/2016, 06/02/2018   Influenza-Unspecified 06/15/2017   PFIZER(Purple Top)SARS-COV-2 Vaccination 09/20/2019, 10/11/2019, 06/12/2020   PNEUMOCOCCAL CONJUGATE-20 07/31/2021   Pfizer Covid-19 Vaccine Bivalent Booster 154yr& up 06/12/2021   Pneumococcal Conjugate-13 06/29/2017   Respiratory Syncytial Virus Vaccine,Recomb Aduvanted(Arexvy) 09/19/2022   Td 12/14/2008   Tdap 03/25/2019   Zoster Recombinat (Shingrix) 02/07/2022, 04/11/2022   Zoster, Live 04/04/2011    These are the patient goals that we discussed:  Goals Addressed               This Visit's Progress     Patient Stated (pt-stated)        Patient stated that she would like to loose 20 lbs.         This is a list of Health Maintenance Items that are overdue or due now: Screening mammography - due in August    Orders/Referrals Placed  Today: No orders of the defined types were placed in this encounter.  (Contact our referral department at [phone] you have not spoken with someone about your referral appointment within the next 5 days)    Follow-up Plan Follow-up with MeHali MarryMD as planned Medicare wellness visit in one year. Patient will access AVS on mychart.      Health Maintenance, Female Adopting a healthy lifestyle and getting preventive care are important in promoting health and wellness. Ask your health care provider about: The right schedule for you to have regular tests and exams. Things you can do on your own to prevent diseases and keep yourself healthy. What should I know about diet, weight, and exercise? Eat a healthy diet  Eat a diet that includes plenty of vegetables, fruits, low-fat dairy products, and lean protein. Do not eat a lot of foods that are high in solid fats, added sugars, or sodium. Maintain a healthy weight Body mass index (BMI) is used to identify weight problems. It estimates body fat based on height and weight. Your health care provider can help determine your BMI and help you achieve or maintain a healthy weight. Get regular exercise Get regular exercise. This is one of the most important things you can do for your health. Most adults should: Exercise for at least 150 minutes each week. The exercise should increase your heart rate and make you sweat (moderate-intensity exercise). Do strengthening exercises at least twice a week. This is in addition to the moderate-intensity exercise. Spend less time sitting.  Even light physical activity can be beneficial. Watch cholesterol and blood lipids Have your blood tested for lipids and cholesterol at 71 years of age, then have this test every 5 years. Have your cholesterol levels checked more often if: Your lipid or cholesterol levels are high. You are older than 71 years of age. You are at high risk for heart  disease. What should I know about cancer screening? Depending on your health history and family history, you may need to have cancer screening at various ages. This may include screening for: Breast cancer. Cervical cancer. Colorectal cancer. Skin cancer. Lung cancer. What should I know about heart disease, diabetes, and high blood pressure? Blood pressure and heart disease High blood pressure causes heart disease and increases the risk of stroke. This is more likely to develop in people who have high blood pressure readings or are overweight. Have your blood pressure checked: Every 3-5 years if you are 20-12 years of age. Every year if you are 2 years old or older. Diabetes Have regular diabetes screenings. This checks your fasting blood sugar level. Have the screening done: Once every three years after age 70 if you are at a normal weight and have a low risk for diabetes. More often and at a younger age if you are overweight or have a high risk for diabetes. What should I know about preventing infection? Hepatitis B If you have a higher risk for hepatitis B, you should be screened for this virus. Talk with your health care provider to find out if you are at risk for hepatitis B infection. Hepatitis C Testing is recommended for: Everyone born from [birth year] through 1964. Anyone with known risk factors for hepatitis C. Sexually transmitted infections (STIs) Get screened for STIs, including gonorrhea and chlamydia, if: You are sexually active and are younger than 71 years of age. You are older than 71 years of age and your health care provider tells you that you are at risk for this type of infection. Your sexual activity has changed since you were last screened, and you are at increased risk for chlamydia or gonorrhea. Ask your health care provider if you are at risk. Ask your health care provider about whether you are at high risk for HIV. Your health care provider may recommend a  prescription medicine to help prevent HIV infection. If you choose to take medicine to prevent HIV, you should first get tested for HIV. You should then be tested every 3 months for as long as you are taking the medicine. Pregnancy If you are about to stop having your period (premenopausal) and you may become pregnant, seek counseling before you get pregnant. Take 400 to 800 micrograms (mcg) of folic acid every day if you become pregnant. Ask for birth control (contraception) if you want to prevent pregnancy. Osteoporosis and menopause Osteoporosis is a disease in which the bones lose minerals and strength with aging. This can result in bone fractures. If you are 70 years old or older, or if you are at risk for osteoporosis and fractures, ask your health care provider if you should: Be screened for bone loss. Take a calcium or vitamin D supplement to lower your risk of fractures. Be given hormone replacement therapy (HRT) to treat symptoms of menopause. Follow these instructions at home: Alcohol use Do not drink alcohol if: Your health care provider tells you not to drink. You are pregnant, may be pregnant, or are planning to become pregnant. If you drink alcohol: Limit how  much you have to: 0-1 drink a day. Know how much alcohol is in your drink. In the [place] one drink equals one 12 oz bottle of beer (355 mL), one 5 oz glass of wine (148 mL), or one 1 oz glass of hard liquor (44 mL). Lifestyle Do not use any products that contain nicotine or tobacco. These products include cigarettes, chewing tobacco, and vaping devices, such as e-cigarettes. If you need help quitting, ask your health care provider. Do not use street drugs. Do not share needles. Ask your health care provider for help if you need support or information about quitting drugs. General instructions Schedule regular health, dental, and eye exams. Stay current with your vaccines. Tell your health care provider if: You often  feel depressed. You have ever been abused or do not feel safe at home. Summary Adopting a healthy lifestyle and getting preventive care are important in promoting health and wellness. Follow your health care provider's instructions about healthy diet, exercising, and getting tested or screened for diseases. Follow your health care provider's instructions on monitoring your cholesterol and blood pressure. This information is not intended to replace advice given to you by your health care provider. Make sure you discuss any questions you have with your health care provider. Document Revised: 01/21/2021 Document Reviewed: 01/21/2021 Elsevier Patient Education  [place].

## 2022-11-24 NOTE — Telephone Encounter (Signed)
Meds ordered this encounter  ?Medications  ? azithromycin (ZITHROMAX) 250 MG tablet  ?  Sig: 2 Ttabs PO on Day 1, then one a day x 4 days.  ?  Dispense:  6 tablet  ?  Refill:  0  ? ? ?

## 2022-12-02 ENCOUNTER — Encounter: Payer: Self-pay | Admitting: Adult Health

## 2022-12-02 ENCOUNTER — Other Ambulatory Visit (HOSPITAL_COMMUNITY): Payer: Self-pay

## 2022-12-02 ENCOUNTER — Telehealth: Payer: Self-pay | Admitting: *Deleted

## 2022-12-02 DIAGNOSIS — G4483 Primary cough headache: Secondary | ICD-10-CM

## 2022-12-02 MED ORDER — BENZONATATE 200 MG PO CAPS
200.0000 mg | ORAL_CAPSULE | Freq: Two times a day (BID) | ORAL | 0 refills | Status: DC | PRN
  Filled 2022-12-02: qty 30, 15d supply, fill #0

## 2022-12-02 NOTE — Telephone Encounter (Signed)
Called pt and advised her of Dr. Gardiner Ramus recommendations.

## 2022-12-02 NOTE — Telephone Encounter (Signed)
Pt called and lvm stating that her cough has gotten worse. She wanted to know if [name] would send in [facility] for this or do something different.    She was given Azithromycin 250 on 11/24/2022.  Will fwd to pcp for advice.

## 2022-12-02 NOTE — Telephone Encounter (Signed)
Tessalon Perles ordered.  Also recommend that we do a chest x-ray if she feels like she is actually getting worse.  Orders Placed This Encounter  Procedures   DG Chest 2 View    Standing Status:   Future    Standing Expiration Date:   12/02/2023    Order Specific Question:   Reason for Exam (SYMPTOM  OR DIAGNOSIS REQUIRED)    Answer:   Cough    Order Specific Question:   Preferred imaging location?    Answer:   [facility]   Meds ordered this encounter  Medications   benzonatate (TESSALON) 200 MG capsule    Sig: Take 1 capsule (200 mg total) by mouth 2 (two) times daily as needed for cough.    Dispense:  30 capsule    Refill:  0

## 2022-12-03 ENCOUNTER — Ambulatory Visit (INDEPENDENT_AMBULATORY_CARE_PROVIDER_SITE_OTHER): Payer: PPO

## 2022-12-03 DIAGNOSIS — R059 Cough, unspecified: Secondary | ICD-10-CM | POA: Diagnosis not present

## 2022-12-03 DIAGNOSIS — G4483 Primary cough headache: Secondary | ICD-10-CM

## 2022-12-05 NOTE — Progress Notes (Signed)
Hi Dawn [name], chest x-ray looks okay.  They saw what looks like probably some scar tissue in that left lung base but it does not look new it was there back in 2018.  No sign of excess fluid.  No sign of pneumonia.

## 2022-12-08 ENCOUNTER — Other Ambulatory Visit: Payer: Self-pay

## 2022-12-10 ENCOUNTER — Other Ambulatory Visit: Payer: Self-pay

## 2022-12-10 ENCOUNTER — Ambulatory Visit (HOSPITAL_COMMUNITY)
Admission: RE | Admit: 2022-12-10 | Discharge: 2022-12-10 | Disposition: A | Discharge status: Home / Self Care | Payer: PPO | Source: Ambulatory Visit | Attending: Adult Health | Admitting: Adult Health

## 2022-12-10 ENCOUNTER — Inpatient Hospital Stay: Payer: PPO | Attending: Adult Health

## 2022-12-10 DIAGNOSIS — I10 Essential (primary) hypertension: Secondary | ICD-10-CM | POA: Insufficient documentation

## 2022-12-10 DIAGNOSIS — E7800 Pure hypercholesterolemia, unspecified: Secondary | ICD-10-CM | POA: Insufficient documentation

## 2022-12-10 DIAGNOSIS — C541 Malignant neoplasm of endometrium: Secondary | ICD-10-CM | POA: Insufficient documentation

## 2022-12-10 DIAGNOSIS — M8580 Other specified disorders of bone density and structure, unspecified site: Secondary | ICD-10-CM | POA: Insufficient documentation

## 2022-12-10 DIAGNOSIS — C50211 Malignant neoplasm of upper-inner quadrant of right female breast: Secondary | ICD-10-CM | POA: Insufficient documentation

## 2022-12-10 DIAGNOSIS — E559 Vitamin D deficiency, unspecified: Secondary | ICD-10-CM | POA: Diagnosis not present

## 2022-12-10 DIAGNOSIS — Z79899 Other long term (current) drug therapy: Secondary | ICD-10-CM | POA: Diagnosis not present

## 2022-12-10 DIAGNOSIS — Z79811 Long term (current) use of aromatase inhibitors: Secondary | ICD-10-CM | POA: Diagnosis not present

## 2022-12-10 DIAGNOSIS — K760 Fatty (change of) liver, not elsewhere classified: Secondary | ICD-10-CM

## 2022-12-10 DIAGNOSIS — K219 Gastro-esophageal reflux disease without esophagitis: Secondary | ICD-10-CM | POA: Insufficient documentation

## 2022-12-10 DIAGNOSIS — E039 Hypothyroidism, unspecified: Secondary | ICD-10-CM

## 2022-12-10 DIAGNOSIS — C50911 Malignant neoplasm of unspecified site of right female breast: Secondary | ICD-10-CM | POA: Diagnosis not present

## 2022-12-10 DIAGNOSIS — M810 Age-related osteoporosis without current pathological fracture: Secondary | ICD-10-CM

## 2022-12-10 DIAGNOSIS — Z170 Estrogen receptor positive status [ER+]: Secondary | ICD-10-CM

## 2022-12-10 DIAGNOSIS — K429 Umbilical hernia without obstruction or gangrene: Secondary | ICD-10-CM | POA: Diagnosis not present

## 2022-12-10 LAB — CMP (CANCER CENTER ONLY)
ALT: 53 U/L — ABNORMAL HIGH (ref 0–44)
AST: 39 U/L (ref 15–41)
Albumin: 4.4 g/dL (ref 3.5–5.0)
Alkaline Phosphatase: 33 U/L — ABNORMAL LOW (ref 38–126)
Anion gap: 8 (ref 5–15)
BUN: 12 mg/dL (ref 8–23)
CO2: 28 mmol/L (ref 22–32)
Calcium: 9.6 mg/dL (ref 8.9–10.3)
Chloride: 102 mmol/L (ref 98–111)
Creatinine: 0.76 mg/dL (ref 0.44–1.00)
GFR, Estimated: >60 mL/min (ref >60)
Glucose, Bld: 112 mg/dL — ABNORMAL HIGH (ref 70–99)
Potassium: 3.5 mmol/L (ref 3.5–5.1)
Sodium: 138 mmol/L (ref 135–145)
Total Bilirubin: 0.7 mg/dL (ref 0.3–1.2)
Total Protein: 7.7 g/dL (ref 6.5–8.1)

## 2022-12-10 LAB — CBC WITH DIFFERENTIAL (CANCER CENTER ONLY)
Abs Immature Granulocytes: 0 10*3/uL (ref 0.00–0.07)
Basophils Absolute: 0 10*3/uL (ref 0.0–0.1)
Basophils Relative: 1 %
Eosinophils Absolute: 0.1 10*3/uL (ref 0.0–0.5)
Eosinophils Relative: 2 %
HCT: 39.3 % (ref 36.0–46.0)
Hemoglobin: 14.4 g/dL (ref 12.0–15.0)
Immature Granulocytes: 0 %
Lymphocytes Relative: 41 %
Lymphs Abs: 2.2 10*3/uL (ref 0.7–4.0)
MCH: 34.7 pg — ABNORMAL HIGH (ref 26.0–34.0)
MCHC: 36.6 g/dL — ABNORMAL HIGH (ref 30.0–36.0)
MCV: 94.7 fL (ref 80.0–100.0)
Monocytes Absolute: 0.5 10*3/uL (ref 0.1–1.0)
Monocytes Relative: 10 %
Neutro Abs: 2.5 10*3/uL (ref 1.7–7.7)
Neutrophils Relative %: 46 %
Platelet Count: 190 10*3/uL (ref 150–400)
RBC: 4.15 MIL/uL (ref 3.87–5.11)
RDW: 12.2 % (ref 11.5–15.5)
WBC Count: 5.4 10*3/uL (ref 4.0–10.5)
nRBC: 0 % (ref 0.0–0.2)

## 2022-12-10 LAB — VITAMIN D 25 HYDROXY (VIT D DEFICIENCY, FRACTURES): Vit D, 25-Hydroxy: 64.08 ng/mL (ref 30–100)

## 2022-12-10 LAB — TSH: TSH: 1.708 u[IU]/mL (ref 0.350–4.500)

## 2022-12-10 MED ORDER — IOHEXOL 300 MG/ML  SOLN
100.0000 mL | Freq: Once | INTRAMUSCULAR | Status: AC | PRN
  Administered 2022-12-10: 100 mL via INTRAVENOUS

## 2022-12-10 MED ORDER — SODIUM CHLORIDE (PF) 0.9 % IJ SOLN
INTRAMUSCULAR | Status: AC
  Filled 2022-12-10: qty 50

## 2022-12-17 ENCOUNTER — Inpatient Hospital Stay: Payer: PPO | Attending: Nurse Practitioner | Admitting: Nurse Practitioner

## 2022-12-17 ENCOUNTER — Encounter: Payer: Self-pay | Admitting: Gastroenterology

## 2022-12-17 ENCOUNTER — Encounter: Payer: Self-pay | Admitting: Nurse Practitioner

## 2022-12-17 ENCOUNTER — Other Ambulatory Visit: Payer: Self-pay | Admitting: Family Medicine

## 2022-12-17 VITALS — BP 144/75 | HR 81 | Temp 97.7°F | Resp 14 | Ht 63.0 in | Wt 187.9 lb

## 2022-12-17 DIAGNOSIS — Z79811 Long term (current) use of aromatase inhibitors: Secondary | ICD-10-CM | POA: Insufficient documentation

## 2022-12-17 DIAGNOSIS — Z800 Family history of malignant neoplasm of digestive organs: Secondary | ICD-10-CM | POA: Insufficient documentation

## 2022-12-17 DIAGNOSIS — Z923 Personal history of irradiation: Secondary | ICD-10-CM | POA: Diagnosis not present

## 2022-12-17 DIAGNOSIS — K7460 Unspecified cirrhosis of liver: Secondary | ICD-10-CM | POA: Diagnosis not present

## 2022-12-17 DIAGNOSIS — M1990 Unspecified osteoarthritis, unspecified site: Secondary | ICD-10-CM | POA: Diagnosis not present

## 2022-12-17 DIAGNOSIS — C50211 Malignant neoplasm of upper-inner quadrant of right female breast: Secondary | ICD-10-CM | POA: Insufficient documentation

## 2022-12-17 DIAGNOSIS — G629 Polyneuropathy, unspecified: Secondary | ICD-10-CM | POA: Insufficient documentation

## 2022-12-17 DIAGNOSIS — Z8719 Personal history of other diseases of the digestive system: Secondary | ICD-10-CM | POA: Diagnosis not present

## 2022-12-17 DIAGNOSIS — I10 Essential (primary) hypertension: Secondary | ICD-10-CM | POA: Insufficient documentation

## 2022-12-17 DIAGNOSIS — E039 Hypothyroidism, unspecified: Secondary | ICD-10-CM | POA: Diagnosis not present

## 2022-12-17 DIAGNOSIS — C541 Malignant neoplasm of endometrium: Secondary | ICD-10-CM | POA: Insufficient documentation

## 2022-12-17 DIAGNOSIS — E7800 Pure hypercholesterolemia, unspecified: Secondary | ICD-10-CM | POA: Diagnosis not present

## 2022-12-17 DIAGNOSIS — K760 Fatty (change of) liver, not elsewhere classified: Secondary | ICD-10-CM | POA: Insufficient documentation

## 2022-12-17 DIAGNOSIS — M8580 Other specified disorders of bone density and structure, unspecified site: Secondary | ICD-10-CM | POA: Insufficient documentation

## 2022-12-17 DIAGNOSIS — E8881 Metabolic syndrome: Secondary | ICD-10-CM | POA: Diagnosis not present

## 2022-12-17 DIAGNOSIS — K219 Gastro-esophageal reflux disease without esophagitis: Secondary | ICD-10-CM | POA: Diagnosis not present

## 2022-12-17 DIAGNOSIS — T7840XD Allergy, unspecified, subsequent encounter: Secondary | ICD-10-CM

## 2022-12-17 DIAGNOSIS — Z79890 Hormone replacement therapy (postmenopausal): Secondary | ICD-10-CM | POA: Insufficient documentation

## 2022-12-17 DIAGNOSIS — Z79899 Other long term (current) drug therapy: Secondary | ICD-10-CM | POA: Insufficient documentation

## 2022-12-17 DIAGNOSIS — Z170 Estrogen receptor positive status [ER+]: Secondary | ICD-10-CM | POA: Diagnosis not present

## 2022-12-17 NOTE — Progress Notes (Signed)
Patient Care Team: [name], MD as PCP - General (Family Medicine) Excell Seltzer, MD (Inactive) as Consulting Physician (General Surgery) Truitt Merle, MD as Consulting Physician (Hematology) [name], MD as Attending Physician (Radiation Oncology)   CHIEF COMPLAINT: Follow up breast and endometrial cancers   Oncology History Overview Note  MSI high Cancer Staging Endometrial cancer [facility]) Staging form: Corpus Uteri - Adenosarcoma, AJCC 8th Edition - Pathologic stage from 06/09/2017: Stage IIIC (pT1a, pN1, cM0) - Signed by Truitt Merle, MD on 06/16/2017  Malignant neoplasm of upper-inner quadrant of right breast in female, estrogen receptor positive ([place]) Staging form: Breast, AJCC 8th Edition - Clinical stage from 05/06/2017: Stage IA (cT1c, cN0, cM0, G2, ER: Positive, PR: Negative, HER2: Positive) - Unsigned - Pathologic stage from 05/25/2017: Stage IIA (pT2, pN0, cM0, G2, ER: Positive, PR: Negative, HER2: Negative) - Signed by Truitt Merle, MD on 06/16/2017     Malignant neoplasm of upper-inner quadrant of right breast in female, estrogen receptor positive  04/27/2017 Initial Biopsy   Diagnosis Breast, right, needle core biopsy INVASIVE DUCTAL CARCINOMA, GRADE 2 Microscopic Comment The neoplasm has intracellular mucin and signet ring cell features. Immunostains shows these cells are positive for ER,GATA3, ck7 and GCDFP, negative for ck20, cdx2, TTF-1 and pax8, The immunostaining pattern supports the neoplasm is breast primary.   04/27/2017 Receptors her2   Estrogen Receptor: 70%, POSITIVE, MODERATE STAINING INTENSITY Progesterone Receptor: 0%, NEGATIVE Proliferation Marker Ki67: 30% HER2 - **POSITIVE** RATIO OF HER2/CEP17 SIGNALS 2.91 AVERAGE HER2 COPY NUMBER PER CELL 7.28   04/27/2017 Initial Diagnosis   Malignant neoplasm of upper-inner quadrant of right breast in female, estrogen receptor positive ([place])   05/07/2017 Imaging   CT Chest W Contrast  05/07/17 IMPRESSION: Tiny well-defined fatty lesion on the pleura at the right lung base. The thinner slice collimation used for today's chest CT eliminates volume-averaging seen in the lesion on the prior exam and confirms that this is a diffusely fatty nodule. This is a benign finding and likely represents a tiny lipoma. No defect in the hemidiaphragm evident to suggest tiny diaphragmatic hernia. Pulmonary hamartoma a consideration although the lack of soft tissue components makes this less likely.   05/15/2017 Genetic Testing   Patient had genetic testing due to a personal history of breast cancer and uterine cancer as well as a family history of cancer. The Multi-Cancer panel was ordered. The Multi-Cancer Panel offered by [facility] includes sequencing and/or deletion duplication testing of the following 83 genes: ALK, APC, ATM, AXIN2,BAP1,  BARD1, BLM, BMPR1A, BRCA1, BRCA2, BRIP1, CASR, CDC73, CDH1, CDK4, CDKN1B, CDKN1C, CDKN2A (p14ARF), CDKN2A (p16INK4a), CEBPA, CHEK2, CTNNA1, DICER1, DIS3L2, EGFR (c.2369C>T, p.Thr790Met variant only), EPCAM (Deletion/duplication testing only), FH, FLCN, GATA2, GPC3, GREM1 (Promoter region deletion/duplication testing only), HOXB13 (c.251G>A, p.Gly84Glu), HRAS, KIT, MAX, MEN1, MET, MITF (c.952G>A, p.Glu318Lys variant only), MLH1, MSH2, MSH3, MSH6, MUTYH, NBN, NF1, NF2, NTHL1, PALB2, PDGFRA, PHOX2B, PMS2, POLD1, POLE, POT1, PRKAR1A, PTCH1, PTEN, RAD50, RAD51C, RAD51D, RB1, RECQL4, RET, RUNX1, SDHAF2, SDHA (sequence changes only), SDHB, SDHC, SDHD, SMAD4, SMARCA4, SMARCB1, SMARCE1, STK11, SUFU, TERC, TERT, TMEM127, TP53, TSC1, TSC2, VHL, WRN and WT1.    Results: No pathogenic mutations were identified. A VUS in the GATA2 gene c.1348G>A (p.Gly450Arg) was identified.  The date of this test report is 05/25/2017.     05/25/2017 Surgery   RIGHT BREAST LUMPECTOMY WITH RADIOACTIVE SEED AND  RIGHT AXILLARY SENTINEL LYMPH NODE BIOPSY and Pot placement by Dr.  Excell Seltzer 05/25/17   05/25/2017 Pathology Results  Diagnosis 05/25/17 1. Breast, lumpectomy, right - INVASIVE DUCTAL CARCINOMA, GRADE II/III, SPANNING 2.2 CM. - DUCTAL CARCINOMA IN SITU, HIGH GRADE. - THE SURGICAL RESECTION MARGINS ARE NEGATIVE FOR CARCINOMA. - SEE ONCOLOGY TABLE BELOW. 2. Breast, excision, right additional superior margin - BENIGN FIBROADIPOSE TISSUE. - BENIGN SKELETAL MUSCLE. - SEE COMMENT. 3. Breast, excision, right additional lateral margin - BENIGN BREAST PARENCHYMA. - THERE IS NO EVIDENCE OF MALIGNANCY. - SEE COMMENT. 4. Breast, excision, chest wall margin - BENIGN FIBROADIPOSE TISSUE. - THERE IS NO EVIDENCE OF MALIGNANCY. - SEE COMMENT. 5. Lymph node, sentinel, biopsy, right axillary - THERE IS NO EVIDENCE OF CARCINOMA IN 1 OF 1 LYMPH NODE (0/1). 6. Lymph node, sentinel, biopsy, right axillary - THERE IS NO EVIDENCE OF CARCINOMA IN 1 OF 1 LYMPH NODE (0/1). 7. Lymph node, sentinel, biopsy, right axillary - THERE IS NO EVIDENCE OF CARCINOMA IN 1 OF 1 LYMPH NODE (0/1).   06/26/2017 PET scan   PET  IMPRESSION: 1. Postoperative findings both in the right breast and in the anatomic pelvis, with associated low-grade activity considered to be postoperative in nature. No hypermetabolic adenopathy or hypermetabolic lesions are identified to suggest active metastatic disease/malignancy. 2. Other imaging findings of potential clinical significance: Aortic Atherosclerosis (ICD10-I70.0). Sigmoid colon diverticulosis. Biapical pleuroparenchymal scarring in the lungs. Small amount of free pelvic fluid in the cul-de-sac, likely postoperative.     07/01/2017 - 10/14/2017 Chemotherapy   Adjuvant TCH with Onpro every 3 weeks for 6 cycles starting on 07/01/17, Changed Taxol to abraxane with cycle 4 due to drug rash reaction, followed by Herceptin every 3 weeks for 6 months    11/01/2017 - 12/08/2017 Radiation Therapy   Radiation therapy to her right breast with Dr.  Isidore Moos   11/04/2017 - 06/2018 Chemotherapy   Maintenance Herceptin starting 11/04/17 and will comeplete her 12 months in 06/2018    12/23/2017 -  Anti-estrogen oral therapy   Adjuvant letrozole 2.5 mg daily started 12/23/17, switched to exemestane on 04/21/18 due to joint pain. Could not afford aromasin, started anastrozole 04/2018.    12/30/2017 Imaging   CT CAP W Contrast 12/30/17 IMPRESSION: Increased size of 1.1 cm aorto-caval retroperitoneal lymph node. This could be reactive due to interval hysterectomy, however metastatic carcinoma cannot be excluded. Consider continued attention on short-term follow-up CT, or PET-CT scan for further evaluation.   Mild hepatic steatosis.   01/25/2018 PET scan   IMPRESSION: 1. Enlarging hypermetabolic aortocaval lymph node is worrisome for metastatic disease. 2. Probable postoperative seroma in the medial right breast, with associated mild inflammatory hypermetabolism.   04/28/2018 Mammogram   Probably benign. Calcifications in the right breast most likely are dystrophic, related to previous surgery, and are probably benign.    04/28/2018 Imaging   DEXA Scan T Score -2.0   06/10/2018 Echocardiogram   06/10/2018 ECHO LV EF: 55% -   60%   08/28/2018 - 08/2018 Chemotherapy   Neratinib 4mg  for 1 week then titrate up with 1 additional tablet weekly up to 12 mg starting 08/28/18. Stopped soon after starting due to diarrhea.   10/25/2018 Imaging   CT CAP 10/25/18  IMPRESSION: 1. No evidence of metastatic disease. 2.  Aortic atherosclerosis (ICD10-170.0).   06/15/2019 Pathology Results   Diagnosis Breast, right, needle core biopsy, 1 o'clock, 7cmfn - DENSE FIBROSIS CONSISTENT WITH PRIOR PROCEDURAL CHANGES. - NO MALIGNANCY IDENTIFIED.   10/24/2019 Imaging   CT CAP W Contrast  IMPRESSION: 1. Stable exam. No evidence of recurrent or metastatic carcinoma within the  chest, abdomen, or pelvis. 2. Colonic diverticulosis, without radiographic evidence  of diverticulitis.   Aortic Atherosclerosis (ICD10-I70.0).   12/05/2020 Imaging   MRI Breast  IMPRESSION: No suspicious areas of enhancement identified within the right or left breast to suggest malignancy.   Endometrial adenocarcinoma  05/07/2017 Initial Diagnosis   Endometrial adenocarcinoma ([place])   06/09/2017 Surgery   XI ROBOTIC ASSISTED TOTAL LAPOROSCOPIC HYSTERECTOMY WITH BILATERAL SALPINGO OOPHORECTOMY and SENTINEL NODE BIOPSY by Dr. Denman George 06/09/17   06/09/2017 Pathology Results   Diagnosis 06/09/17 1. Lymph node, sentinel, biopsy, right external iliac - METASTATIC ADENOCARCINOMA IN ONE LYMPH NODE (1/1). 2. Lymph node, sentinel, biopsy, left obturator - ONE BENIGN LYMPH NODE (0/1). 3. Lymph node, sentinel, biopsy, left external iliac - METASTATIC ADENOCARCINOMA IN ONE LYMPH NODE (1/1). 4. Uterus +/- tubes/ovaries, neoplastic ENDOMETRIUM: - ENDOMETRIAL ADENOCARCINOMA, 3.2 CM. - CARCINOMA INVADES INNER HALF OF MYOMETRIUM. 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 - CERVIX, BILATERAL FALLOPIAN TUBES AND BILATERAL OVARIES FREE OF TUMOR   08/18/2017 - 09/17/2017 Radiation Therapy   vaginal brachytherapy per 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   02/17/2018 - 02/08/2020 Antibody Plan   Keytruda every 3 weeks starting 02/17/18. Completed 2 years on 02/08/20   04/11/2019 Imaging   CT AP W Contrast  IMPRESSION: 1. No evidence of recurrent or metastatic carcinoma within the abdomen or pelvis. 2. Colonic diverticulosis. No radiographic evidence of diverticulitis.     04/23/2020 Imaging   CT AP W contrast  IMPRESSION: 1. Stable exam. Specifically, no findings to suggest recurrent or metastatic disease. 2. Left colonic diverticulosis without diverticulitis. 3. Aortic Atherosclerosis (ICD10-I70.0).     10/22/2020 Imaging   CT CAP  IMPRESSION: 1. No  evidence of metastatic disease in the chest, abdomen or pelvis. 2. Small hiatal hernia. 3. Mild sigmoid diverticulosis. 4. Aortic Atherosclerosis (ICD10-I70.0).        CURRENT THERAPY:  -Antiestrogen since 12/2017, currently Anastrozole since 04/2018; goal 7 years  -Zometa q1months for 2 years starting 10/24/20 - 05/2022  INTERVAL HISTORY Ms. Kastl presents for follow up as scheduled. Last seen by [name] 11/13/21 and Wilber Bihari NP in 05/2022. She is doing well finally recovering from severe URI. Still mild lingering cough but much better. She has intermittent "tightness" over the RUQ she attributes to coughing/muscle strain. R breast feels hard but same since surgery, denies new lump/mass, nipple discharge, inversion, or skin change. Tolerating anastrozole. Arthritics pains are stable except more bothersome in her feet. She is ready to start back exercise after her illness. Provider put her on alpha lipoic and B 6 for arthritis and neuropathy which are helping some. Although walking and getting her rest help the best. She underwent surveillance CT last week, mention of cirrhosis worries her.   ROS  All other systems reviewed and negative   Past Medical History:  Diagnosis Date   Allergy 2018   Anemia    as a child.   Arthritis    Atypical mole 06/04/2018   moderate top of right foot   Atypical mole 06/04/2018   left elbow mild   Atypical mole 06/04/2018   left upper back mild   Bilateral cataracts    Bilateral leg cramps    Cancer ([place])    skin   Dyspnea    Endometrial cancer ([facility])    Family history of bladder cancer  Family history of colon cancer in father    Fatty liver    Fuchs' corneal dystrophy    GERD (gastroesophageal reflux disease)    Hearing loss    Right side 30%   Heart murmur 1960   Heartburn    History of hiatal hernia    small size   History of radiation therapy 08/21/17, 08/28/17,09/01/17, 09/11/17, 09/17/17   Vaginal cuff brachytherapy.    History of  radiation therapy 11/11/17- 12/08/17   Right Breast treated to 40.05 Gy with 15 fx of 2.67 Gy and a boost of 10 Gy with 5 fx.    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, mother had when patient was child   Uterine cancer ([place]) 04/2017   endometrial cancer   Varices, gastric    Vertigo      Past Surgical History:  Procedure Laterality Date   ABDOMINAL HYSTERECTOMY  2018   BREAST BIOPSY Right 04/27/2017   BREAST LUMPECTOMY WITH RADIOACTIVE SEED AND SENTINEL LYMPH NODE BIOPSY Right 05/25/2017   Procedure: RIGHT BREAST LUMPECTOMY WITH RADIOACTIVE SEED AND  RIGHT AXILLARY SENTINEL LYMPH NODE BIOPSY;  Surgeon: Excell Seltzer, MD;  Location: [facility];  Service: General;  Laterality: Right;   CATARACT EXTRACTION, BILATERAL     COLONOSCOPY     EYE SURGERY  2018   HYSTEROSCOPY WITH D & C N/A 04/29/2017   Procedure: DILATATION AND CURETTAGE /HYSTEROSCOPY;  Surgeon: Linda Hedges, DO;  Location: [facility];  Service: Gynecology;  Laterality: N/A;   PORT-A-CATH REMOVAL Left 04/16/2021   Procedure: REMOVAL PORT-A-CATH;  Surgeon: Johnathan Hausen, MD;  Location: [facility];  Service: General;  Laterality: Left;   PORTACATH PLACEMENT Left 05/25/2017   Procedure: INSERTION PORT-A-CATH;  Surgeon: Excell Seltzer, MD;  Location: [facility];  Service: General;  Laterality: Left;   ROBOTIC ASSISTED TOTAL HYSTERECTOMY WITH BILATERAL SALPINGO OOPHERECTOMY N/A 06/09/2017   Procedure: XI ROBOTIC ASSISTED TOTAL LAPOROSCOPIC HYSTERECTOMY WITH BILATERAL SALPINGO OOPHORECTOMY;  Surgeon: Everitt Amber, MD;  Location: [facility];   Service: Gynecology;  Laterality: N/A;   SENTINEL NODE BIOPSY N/A 06/09/2017   Procedure: SENTINEL NODE BIOPSY;  Surgeon: Everitt Amber, MD;  Location: [facility];  Service: Gynecology;  Laterality: N/A;     Outpatient Encounter Medications as of 12/17/2022  Medication Sig   acetaminophen (TYLENOL) 500 MG tablet Take 500 mg by mouth every 6 (six) hours as needed for moderate pain.   Alpha-Lipoic Acid 600 MG CAPS Take 1 capsule (600 mg total) by mouth daily.   anastrozole (ARIMIDEX) 1 MG tablet Take 1 tablet (1 mg total) by mouth daily.   Ascorbic Acid (VITAMIN C) 1000 MG tablet Take 1,000 mg by mouth daily.   atorvastatin (LIPITOR) 20 MG tablet Take 1 tablet (20 mg total) by mouth daily.   b complex vitamins capsule Take 1 capsule by mouth daily.   Cholecalciferol (VITAMIN D3) 75 MCG (3000 UT) TABS Take 1 capsule by mouth daily.   fluticasone (FLONASE) 50 MCG/ACT nasal spray Place 2 sprays into both nostrils daily.   levothyroxine (SYNTHROID)  50 MCG tablet TAKE 1 TABLET BY MOUTH  DAILY   Multiple Vitamin (MULTIVITAMIN) capsule Take 1 capsule by mouth daily.   olmesartan-hydrochlorothiazide (BENICAR HCT) 20-12.5 MG tablet TAKE 1 TABLET BY MOUTH  DAILY   pantoprazole (PROTONIX) 20 MG tablet TAKE 1 TABLET BY MOUTH  DAILY--need follow up appointment for further refills [phone]   pyridOXINE (VITAMIN B-6) 50 MG tablet Take 1 tablet (50 mg total) by mouth daily.   RSV vaccine recomb adjuvanted (AREXVY) 120 MCG/0.5ML injection Inject into the muscle.   triamcinolone cream (KENALOG) 0.1 % Apply 1 application topically 2 (two) times daily.   benzonatate (TESSALON) 200 MG capsule Take 1 capsule (200 mg total) by mouth 2 (two) times daily as needed for cough.   [DISCONTINUED] potassium chloride SA (KLOR-CON M) 20 MEQ tablet TAKE 1 TABLET BY MOUTH  DAILY (Patient not taking: Reported on 11/19/2022)   [DISCONTINUED] predniSONE (DELTASONE) 20 MG tablet Take 2 tablets (40 mg total) by mouth daily with breakfast.  (Patient not taking: Reported on 11/24/2022)   No facility-administered encounter medications on file as of 12/17/2022.     Today's Vitals   12/17/22 1106  BP: (!) 144/75  Pulse: 81  Resp: 14  Temp: 97.7 F (36.5 C)  TempSrc: Oral  SpO2: 96%  Weight: 187 lb 14.4 oz (85.2 kg)  Height: 5\' 3"  (1.6 m)   Body mass index is 33.28 kg/m.   PHYSICAL EXAM GENERAL:alert, no distress and comfortable SKIN: no rash  EYES: sclera clear NECK: without mass LYMPH:  no palpable cervical or supraclavicular lymphadenopathy  LUNGS: clear with normal breathing effort HEART: regular rate & rhythm, no lower extremity edema ABDOMEN: abdomen soft, non-tender and normal bowel sounds. No palpable masses NEURO: alert & oriented x 3 with fluent speech, no focal motor/sensory deficits Breast exam: No nipple discharge or inversion.  S/p right lumpectomy, incisions completely healed with minimal scar tissue and bilateral scattered fibroglandular densities.  No palpable mass or nodularity in either breast or axilla that I could appreciate.   CBC    Component Value Date/Time   WBC 5.4 12/10/2022 1330   WBC 4.1 07/25/2020 0928   RBC 4.15 12/10/2022 1330   HGB 14.4 12/10/2022 1330   HGB 11.3 (L) 09/11/2017 1019   HCT 39.3 12/10/2022 1330   HCT 32.8 (L) 09/11/2017 1019   PLT 190 12/10/2022 1330   PLT 201 09/11/2017 1019   MCV 94.7 12/10/2022 1330   MCV 102.6 (H) 09/11/2017 1019   MCH 34.7 (H) 12/10/2022 1330   MCHC 36.6 (H) 12/10/2022 1330   RDW 12.2 12/10/2022 1330   RDW 17.2 (H) 09/11/2017 1019   LYMPHSABS 2.2 12/10/2022 1330   LYMPHSABS 3.2 09/11/2017 1019   MONOABS 0.5 12/10/2022 1330   MONOABS 1.8 (H) 09/11/2017 1019   EOSABS 0.1 12/10/2022 1330   EOSABS 0.1 09/11/2017 1019   BASOSABS 0.0 12/10/2022 1330   BASOSABS 0.1 09/11/2017 1019     CMP     Component Value Date/Time   NA 138 12/10/2022 1330   NA 138 02/18/2021 1244   NA 136 09/02/2017 0928   K 3.5 12/10/2022 1330   K 3.3 (L)  09/02/2017 0928   CL 102 12/10/2022 1330   CO2 28 12/10/2022 1330   CO2 27 09/02/2017 0928   GLUCOSE 112 (H) 12/10/2022 1330   GLUCOSE 96 09/02/2017 0928   BUN 12 12/10/2022 1330   BUN 12 02/18/2021 1244   BUN 9.0 09/02/2017 0928   CREATININE 0.76 12/10/2022 1330  CREATININE 0.8 09/02/2017 0928   CALCIUM 9.6 12/10/2022 1330   CALCIUM 9.4 09/02/2017 0928   PROT 7.7 12/10/2022 1330   PROT 7.6 02/18/2021 1244   PROT 7.2 09/02/2017 0928   ALBUMIN 4.4 12/10/2022 1330   ALBUMIN 4.7 02/18/2021 1244   ALBUMIN 3.8 09/02/2017 0928   AST 39 12/10/2022 1330   AST 96 (H) 09/02/2017 0928   ALT 53 (H) 12/10/2022 1330   ALT 144 (H) 09/02/2017 0928   ALKPHOS 33 (L) 12/10/2022 1330   ALKPHOS 57 09/02/2017 0928   BILITOT 0.7 12/10/2022 1330   BILITOT 0.57 09/02/2017 0928   GFRNONAA >60 12/10/2022 1330   GFRNONAA 81 06/04/2016 0839   GFRAA >60 04/23/2020 0916   GFRAA >60 07/27/2019 0920   GFRAA >89 06/04/2016 0839     ASSESSMENT & PLAN:Dawn [name] is a 72 y.o. female with    1. 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  S/p right breast lumpectomy, adjuvant chemo and radiation. She tried Nerlynx but could not tolerate due to diarrhea -She started anti-estrogen therapy with letrozole in 12/2017. Due to joint pain she switched to Anastrozole in 04/2018. Goal 7 years due to high risk disease.  -Last screening breast MRI in 11/2020 and MM on 04/2022 were benign. Not on annual MRI schedule anymore but would like to do in 10/2023 (6 mo after next mammo) -Ms. Pasternack is clinically doing well. Tolerating anastrozole. Breast exam is benign, labs are normal. No clinical concern for recurrence -Continue surveillance. Next mammo 04/2023. Plan to complete AI in 12/2024 -F/up in 9 months, or sooner if needed  2. Endometrial adenocarcinoma with lymph node metastasis, pT1apN1M0, FIGO stage IIIc, MSI-H, probable RP  node recurrence in 12/2017  -Diagnosed in 04/2017. Treated with hysterectomy, BSO, adjuvant radiation, and chemo with Carbo/taxol. -genetics 04/2017 were negative with VUS in GATA2. -She had retroperitoneal lymph node metastasis in April 2019 and completed 2 years of Keytruda in May 2021. She has recovered well.  -Ms. Crivelli is clinically doing well.  Exam is benign, I reviewed CT CAP from last week with shows no evidence of recurrence.  Remains in clinical remission -I do not plan to order routine surveillance scans at this point, but certainly will explore if she has new concerns  3.  Cirrhosis and varices -Incidental finding on surveillance CT from last week.  She does have history of fatty liver; no alcohol use, no knowledge of hepatitis -She is worried about this and would like to pursue workup, I will refer her to GI -Previously seen by Dr. Ardis Hughs, I will ask [facility] to assign her a new provider   4. HTN, Acid reflux, High Cholesterol  -Well Controlled, Continue to f/u with PCP  -Found to have gastritis on 11/22/19 upper endoscopy/colonoscopy with Dr. Ardis Hughs. She is on PPI.     5. Osteopenia  -most recent DEXA scan in 07/2020 revealed T score of -2.4. -She was on monthly boniva, prescribed by her PCP, since 02/2018. Switched to Zometa q101months for 2 years starting 10/24/20 - 05/2022  -Continue Vitamin D and Calcium supplements.  -Recent vit D level WNL, continue current dose   6. Metabolic syndrome, hyperglycemia -She has no history of diabetes.  -Followed by endocrinologist, on levothyroxine; recent TSH normal -weight gain lately; continue to follow-up with weight management clinic    7. Arthritis, Neuropathy G2 -She has arthritis, mainly in her back. She will take Tylenol  as needed.  -Managed by Sports Medicine Dr. Dianah Field -She was started on B6 and alphalogic acid supplement for her neuropathy which has helped -she declined to participate in [place]-2102 neuropathy  study    PLAN: -Recent CT CAP and labs reviewed -No evidence of breast or endometrial cancer recurrence; continue surveillance -Continue Anastrazole -Referral to GI for Cirrhosis/varices seen on recent CT -F/up in 9 months, or sooner if needed   Orders Placed This Encounter  Procedures   Ambulatory referral to Gastroenterology    Referral Priority:   Routine    Referral Type:   Consultation    Referral Reason:   Specialty Services Required    Number of Visits Requested:   1      All questions were answered. The patient knows to call the clinic with any problems, questions or concerns. No barriers to learning were detected. I spent 20 minutes counseling the patient face to face. The total time spent in the appointment was 30 minutes and more than 50% was on counseling, review of test results, and coordination of care.   Cira Rue, NP-C 12/17/2022

## 2022-12-18 ENCOUNTER — Other Ambulatory Visit: Payer: Self-pay

## 2022-12-18 ENCOUNTER — Other Ambulatory Visit (HOSPITAL_COMMUNITY): Payer: Self-pay

## 2022-12-18 MED ORDER — FLUTICASONE PROPIONATE 50 MCG/ACT NA SUSP
2.0000 | Freq: Every day | NASAL | 3 refills | Status: DC
  Filled 2022-12-18: qty 48, 90d supply, fill #0
  Filled 2023-03-16: qty 48, 90d supply, fill #1
  Filled 2023-06-15: qty 48, 90d supply, fill #2
  Filled 2023-09-10: qty 48, 90d supply, fill #3

## 2022-12-22 ENCOUNTER — Other Ambulatory Visit (INDEPENDENT_AMBULATORY_CARE_PROVIDER_SITE_OTHER): Payer: PPO

## 2022-12-22 ENCOUNTER — Ambulatory Visit: Payer: PPO | Admitting: Gastroenterology

## 2022-12-22 ENCOUNTER — Encounter: Payer: Self-pay | Admitting: Gastroenterology

## 2022-12-22 VITALS — BP 136/74 | HR 80 | Ht 63.0 in | Wt 188.6 lb

## 2022-12-22 DIAGNOSIS — R7989 Other specified abnormal findings of blood chemistry: Secondary | ICD-10-CM

## 2022-12-22 DIAGNOSIS — R932 Abnormal findings on diagnostic imaging of liver and biliary tract: Secondary | ICD-10-CM

## 2022-12-22 DIAGNOSIS — K760 Fatty (change of) liver, not elsewhere classified: Secondary | ICD-10-CM

## 2022-12-22 DIAGNOSIS — Z8719 Personal history of other diseases of the digestive system: Secondary | ICD-10-CM

## 2022-12-22 NOTE — Progress Notes (Signed)
Dawn [name]    [id number]    1951/02/26  Primary Care Physician:Metheney, Barbarann Ehlers, MD  Referring Physician: Agapito Games, MD [address] [place],  [place] [address]   Chief complaint:  Chief Complaint  Patient presents with   CT Scan    Enlarged liver, possible cirrhosis   HPI: Patient has history of breast and endometrial cancer diagnosed in 2018. She has undergone a Lumpectomy in 2018 and has been in remission since 2021. Patient has history of diverticulosis.   Dawn [name] is a very pleasant 72 y.o. female presenting to clinic today for consult for abnormal LFT's, ?  Cirrhosis on recent CT at the request of Dr. Nani Gasser.  Today, she reports feeling well overall. She reports having a cough that persisted for a month. She states that they believed it was Covid or a viral virus. Routine surveillance CT chest abdomen pelvis showed findings concerning for possible cirrhosis and portal hypertension with gastric varices She has longstanding history of fatty liver She states that she loves sugar and reports being a tea drinker including green tea with honey. She states that she would occasionally drink a beer from time to time.   She takes pantoprazole 20 mg.  She denies diarrhea, constipation, nausea, blood in stool, black stool, vomiting, abdominal pain, bloating, unintentional weight loss, reflux, dysphagia.  Patient reports a family history of  colon cancer in her father.    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   GI Hx:  EGD 11-22-19 - Moderate inflammation characterized by erosions, erythema and granularity was found in the gastric antrum. Biopsies were taken with a cold forceps for histology. - Isolated proximal stomach varices along the greater curvature of the stomach. No signs of recent bleeding. - Mild reflux related esophagitis (LA Grade A). - The examination was otherwise  normal. Pathology: Surgical [P], colon, transverse, polyp (2) - TUBULAR ADENOMA. - SESSILE SERRATED POLYP WITHOUT DYSPLASIA. - NO HIGH GRADE DYSPLASIA OR MALIGNANCY.  COLONOSCOPY 11-22-19 - Two 2 to 4 mm polyps in the transverse colon, removed with a cold snare. Resected and retrieved. - Diverticulosis in the left colon. - The examination was otherwise normal on direct and retroflexion views. Pathology: Surgical [P], gastric antrum and gastric body - MILD CHRONIC GASTRITIS. - WARTHIN-STARRY IS NEGATIVE FOR HELICOBACTER PYLORI. - NO INTESTINAL METAPLASIA, DYSPLASIA, OR MALIGNANCY.  COLONOSCOPY 10-26-14 1. Normal colonoscopy  2. There was mild diverticulosis noted in the sigmoid colon  3.long redundant hypotonic colon. Difficult exam. Decreased haustral folds, suspect colonic inertia  COLONOSCOPY 09-18-2009 -mild diverticulosis noted in the sigmoid colon   Current Outpatient Medications:    acetaminophen (TYLENOL) 500 MG tablet, Take 500 mg by mouth every 6 (six) hours as needed for moderate pain., Disp: , Rfl:    Alpha-Lipoic Acid 600 MG CAPS, Take 1 capsule (600 mg total) by mouth daily., Disp: 90 capsule, Rfl: 3   anastrozole (ARIMIDEX) 1 MG tablet, Take 1 tablet (1 mg total) by mouth daily., Disp: 90 tablet, Rfl: 3   Ascorbic Acid (VITAMIN C) 1000 MG tablet, Take 1,000 mg by mouth daily., Disp: , Rfl:    atorvastatin (LIPITOR) 20 MG tablet, Take 1 tablet (20 mg total) by mouth daily., Disp: 90 tablet, Rfl: 3   b complex vitamins capsule, Take 1 capsule by mouth daily., Disp: , Rfl:    Cholecalciferol (VITAMIN D3) 75 MCG (3000 UT)  TABS, Take 1 capsule by mouth daily., Disp: , Rfl:    fluticasone (FLONASE) 50 MCG/ACT nasal spray, Place 2 sprays into both nostrils daily., Disp: 48 g, Rfl: 3   levothyroxine (SYNTHROID) 50 MCG tablet, TAKE 1 TABLET BY MOUTH  DAILY, Disp: 90 tablet, Rfl: 3   Multiple Vitamin (MULTIVITAMIN) capsule, Take 1 capsule by mouth daily., Disp: , Rfl:     olmesartan-hydrochlorothiazide (BENICAR HCT) 20-12.5 MG tablet, TAKE 1 TABLET BY MOUTH  DAILY, Disp: 90 tablet, Rfl: 3   pantoprazole (PROTONIX) 20 MG tablet, TAKE 1 TABLET BY MOUTH  DAILY--need follow up appointment for further refills [phone], Disp: 90 tablet, Rfl: 0   pyridOXINE (VITAMIN B-6) 50 MG tablet, Take 1 tablet (50 mg total) by mouth daily., Disp: 90 tablet, Rfl: 3   RSV vaccine recomb adjuvanted (AREXVY) 120 MCG/0.5ML injection, Inject into the muscle., Disp: 0.5 mL, Rfl: 0   triamcinolone cream (KENALOG) 0.1 %, Apply 1 application topically 2 (two) times daily., Disp: 30 g, Rfl: 1   Allergies as of 12/22/2022 - Review Complete 12/22/2022  Allergen Reaction Noted   Ciprofloxacin Hives 05/25/2017   Paclitaxel Rash 11/19/2017   Crestor [rosuvastatin calcium] Other (See Comments) 09/26/2011   Fosamax [alendronate sodium] Other (See Comments) 11/19/2017   Livalo [pitavastatin] Other (See Comments) 06/08/2020   Penicillins Rash 04/22/2007    Past Medical History:  Diagnosis Date   Allergy 2018   Anemia    as a child.   Arthritis    Atypical mole 06/04/2018   moderate top of right foot   Atypical mole 06/04/2018   left elbow mild   Atypical mole 06/04/2018   left upper back mild   Bilateral cataracts    Bilateral leg cramps    Cancer    skin   Dyspnea    Endometrial cancer    Family history of bladder cancer    Family history of colon cancer in father    Fatty liver    Fuchs' corneal dystrophy    GERD (gastroesophageal reflux disease)    Hearing loss    Right side 30%   Heart murmur 1960   Heartburn    History of hiatal hernia    small size   History of radiation therapy 08/21/17, 08/28/17,09/01/17, 09/11/17, 09/17/17   Vaginal cuff brachytherapy.    History of radiation therapy 11/11/17- 12/08/17   Right Breast treated to 40.05 Gy with 15 fx of 2.67 Gy and a boost of 10 Gy with 5 fx.    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   NAFL (nonalcoholic fatty liver)    Obesity    Osteoarthritis    Osteopenia    PONV (postoperative nausea and vomiting)    Port-A-Cath in place 07/22/2017   Removed 04/16/21   Tuberculosis    tested positive, mother had when patient was child   Uterine cancer 04/2017   endometrial cancer   Varices, gastric    Vertigo     Past Surgical History:  Procedure Laterality Date   ABDOMINAL HYSTERECTOMY  2018   BREAST BIOPSY Right 04/27/2017   BREAST LUMPECTOMY WITH RADIOACTIVE SEED AND SENTINEL LYMPH NODE BIOPSY Right 05/25/2017   Procedure: RIGHT BREAST LUMPECTOMY WITH RADIOACTIVE SEED AND  RIGHT AXILLARY SENTINEL LYMPH NODE BIOPSY;  Surgeon: Glenna FellowsHoxworth, Benjamin, MD;  Location: [facility];  Service:  General;  Laterality: Right;   CATARACT EXTRACTION, BILATERAL     COLONOSCOPY     EYE SURGERY  2018   HYSTEROSCOPY WITH D & C N/A 04/29/2017   Procedure: DILATATION AND CURETTAGE /HYSTEROSCOPY;  Surgeon: Mitchel Honour, DO;  Location: [facility];  Service: Gynecology;  Laterality: N/A;   PORT-A-CATH REMOVAL Left 04/16/2021   Procedure: REMOVAL PORT-A-CATH;  Surgeon: Luretha Murphy, MD;  Location: [facility];  Service: General;  Laterality: Left;   PORTACATH PLACEMENT Left 05/25/2017   Procedure: INSERTION PORT-A-CATH;  Surgeon: Glenna Fellows, MD;  Location: [facility];  Service: General;  Laterality: Left;   ROBOTIC ASSISTED TOTAL HYSTERECTOMY WITH BILATERAL SALPINGO OOPHERECTOMY N/A 06/09/2017   Procedure: XI ROBOTIC ASSISTED TOTAL LAPOROSCOPIC HYSTERECTOMY WITH BILATERAL SALPINGO OOPHORECTOMY;  Surgeon: Adolphus Birchwood, MD;  Location: [facility];  Service: Gynecology;  Laterality: N/A;   SENTINEL NODE BIOPSY N/A 06/09/2017   Procedure: SENTINEL NODE BIOPSY;  Surgeon: Adolphus Birchwood, MD;  Location: [facility];  Service: Gynecology;  Laterality: N/A;    Family  History  Problem Relation Age of Onset   Stroke Mother 27   Depression Mother    Anxiety disorder Mother    Bipolar disorder Mother    Obesity Mother    Colon cancer Father 60   Heart attack Father 93   Prostate cancer Father        dx 36's   Bladder Cancer Father 65   Hypertension Father    Hyperlipidemia Father    Heart disease Father    Obesity Father    Hyperlipidemia Sister    Asthma Brother    Hyperlipidemia Brother    Heart attack Maternal Grandmother 46   Heart disease Maternal Grandmother    Diabetes Maternal Grandfather    Kidney disease Maternal Grandfather    Cancer Paternal Grandmother 48       GYN cancer ( thinks ovarian, maybe uterine)   Anxiety disorder Daughter    Anxiety disorder Son    Breast cancer Other 30   Colon cancer Other 28   Cancer Other        type unk, age dx unk   Esophageal cancer Neg Hx    Stomach cancer Neg Hx     Social History   Socioeconomic History   Marital status: Married    Spouse name: Maurine Minister   Number of children: 2   Years of education: 16   Highest education level: Bachelor's degree (e.g., BA, AB, BS)  Occupational History    Employer: [employer]    Comment: [employer]   Tobacco Use   Smoking status: Never   Smokeless tobacco: Never  Vaping Use   Vaping Use: Never used  Substance and Sexual Activity   Alcohol use: Not Currently    Comment: <1/week wine or beer occasional   Drug use: No   Sexual activity: Yes    Birth control/protection: Post-menopausal  Other Topics Concern   Not on file  Social History Narrative   Lives with her husband. Still works part time. She enjoys hiking, walking and gardening.     Social Determinants of Health   Financial Resource Strain: Low Risk  (11/20/2022)   Overall Financial Resource Strain (CARDIA)    Difficulty of Paying Living Expenses: Not hard at all  Food Insecurity: No Food Insecurity (11/20/2022)   Hunger Vital Sign    Worried About Running Out  of Food in the Last Year: Never true  Ran Out of Food in the Last Year: Never true  Transportation Needs: No Transportation Needs (11/20/2022)   PRAPARE - Administrator, Civil Service (Medical): No    Lack of Transportation (Non-Medical): No  Physical Activity: Sufficiently Active (11/20/2022)   Exercise Vital Sign    Days of Exercise per Week: 4 days    Minutes of Exercise per Session: 60 min  Stress: No Stress Concern Present (11/20/2022)   [employer] of Occupational Health - Occupational Stress Questionnaire    Feeling of Stress : Only a little  Social Connections: Socially Integrated (11/24/2022)   Social Connection and Isolation Panel [NHANES]    Frequency of Communication with Friends and Family: Three times a week    Frequency of Social Gatherings with Friends and Family: Once a week    Attends Religious Services: More than 4 times per year    Active Member of [employer] or Organizations: Yes    Attends Banker Meetings: Never    Marital Status: Married  Catering manager Violence: Not At Risk (11/24/2022)   Humiliation, Afraid, Rape, and Kick questionnaire    Fear of Current or Ex-Partner: No    Emotionally Abused: No    Physically Abused: No    Sexually Abused: No      Review of systems: Review of Systems  Constitutional:  Positive for fatigue.  HENT:  Negative for trouble swallowing.   Respiratory:  Positive for cough.   Gastrointestinal:  Negative for abdominal distention, abdominal pain, anal bleeding, blood in stool, constipation, diarrhea, nausea and vomiting.       +belching +reflux +  Skin:  Negative for rash.  All other systems reviewed and are negative.     Physical Exam: Vitals:   12/22/22 1329  BP: 136/74  Pulse: 80   Body mass index is 33.41 kg/m.  General: well-appearing   Eyes: sclera anicteric, no redness GI: soft, no tenderness, with active bowel sounds. No guarding or palpable organomegaly noted. Skin; warm and  dry, no rash or jaundice noted Neuro: awake, alert and oriented x 3. Normal gross motor function and fluent speech, no asterixis  Data Reviewed:  Reviewed labs, radiology imaging, old records and pertinent past GI work up   Assessment and Plan/Recommendations: 72 year old very pleasant female with history of endometrial and breast cancer diagnosed in 2018, in clinical remission s/p treatment, longstanding history of steatohepatitis here for follow-up visit with findings on recent CT concerning for progression to cirrhosis and portal hypertension with gastric varices  Schedule for EGD to evaluate for esophageal varices with suspected portal hypertension The risks and benefits as well as alternatives of endoscopic procedure(s) have been discussed and reviewed. All questions answered. The patient agrees to proceed.  Will obtain right upper quadrant ultrasound with elastography to assess degree of fibrosis.  Clinically she does not have any features of portal hypertension or cirrhosis, normal albumin and platelet count  Check PT/INR and AFP.  Will check for any possible underlying etiology for chronic liver disease other than steatohepatitis Check iron panel with ferritin, ANA, anti-smooth muscle antibody, and T mitochondrial antibody, alpha-1 antitrypsin level and ceruloplasmin Discussed dietary modification with limiting sugar intake and simple carbohydrates Continue daily exercise Avoid alcohol  Start daily vitamin E 400 international units [over-the-counter]  Due for surveillance colonoscopy in 2026  Return in 3 months or sooner if needed  This visit required 40 minutes of patient care (this includes precharting, chart review, review of results, face-to-face time used for  counseling as well as treatment plan and follow-up. The patient was provided an opportunity to ask questions and all were answered. The patient agreed with the plan and demonstrated an understanding of the  instructions.   I,Safa M Kadhim,acting as a scribe for Marsa Aris, MD.,have documented all relevant documentation on the behalf of Marsa Aris, MD,as directed by  Marsa Aris, MD while in the presence of Marsa Aris, MD.   I, Marsa Aris, MD, have reviewed all documentation for this visit. The documentation on 12/22/22 for the exam, diagnosis, procedures, and orders are all accurate and complete.   Iona Beard , MD  CC: Agapito Games, *

## 2022-12-22 NOTE — Patient Instructions (Signed)
You have been scheduled for an endoscopy. Please follow written instructions given to you at your visit today. If you use inhalers (even only as needed), please bring them with you on the day of your procedure.  Your provider has requested that you go to the basement level for lab work before leaving today. Press "B" on the elevator. The lab is located at the first door on the left as you exit the elevator.  You have been scheduled for an abdominal ultrasound at [facility] (1st floor of hospital) on 12/29/2022 at 8:30am. . Make certain not to have anything to eat or drink 6 hours prior to your appointment. Should you need to reschedule your appointment, please contact radiology at [phone]. This test typically takes about 30 minutes to perform.  Weve scheduled you a follow up for July 22 at 8:50am   _______________________________________________________  If your blood pressure at your visit was 140/90 or greater, please contact your primary care physician to follow up on this.  _______________________________________________________  If you are age 71 or older, your body mass index should be between 23-30. Your Body mass index is 33.41 kg/m. If this is out of the aforementioned range listed, please consider follow up with your Primary Care Provider.  If you are age 75 or younger, your body mass index should be between 19-25. Your Body mass index is 33.41 kg/m. If this is out of the aformentioned range listed, please consider follow up with your Primary Care Provider.   ________________________________________________________  The [facility] providers would like to encourage you to use [facility] to communicate with providers for non-urgent requests or questions.  Due to long hold times on the telephone, sending your provider a message by [facility] may be a faster and more efficient way to get a response.  Please allow 48 business hours for a response.  Please remember that this is  for non-urgent requests.  _______________________________________________________ It was a pleasure to see you today!  Thank you for trusting me with your gastrointestinal care!

## 2022-12-23 LAB — CERULOPLASMIN: Ceruloplasmin: 26 mg/dL (ref 18–53)

## 2022-12-23 LAB — IBC + FERRITIN
Ferritin: 200.5 ng/mL (ref 10.0–291.0)
Iron: 81 ug/dL (ref 42–145)
Saturation Ratios: 21 % (ref 20.0–50.0)
TIBC: 386.4 ug/dL (ref 250.0–450.0)
Transferrin: 276 mg/dL (ref 212.0–360.0)

## 2022-12-23 LAB — ANA: Anti Nuclear Antibody (ANA): NEGATIVE

## 2022-12-24 ENCOUNTER — Other Ambulatory Visit (INDEPENDENT_AMBULATORY_CARE_PROVIDER_SITE_OTHER): Payer: PPO

## 2022-12-24 ENCOUNTER — Ambulatory Visit (AMBULATORY_SURGERY_CENTER): Payer: PPO | Admitting: Gastroenterology

## 2022-12-24 ENCOUNTER — Other Ambulatory Visit (HOSPITAL_COMMUNITY): Payer: Self-pay

## 2022-12-24 ENCOUNTER — Encounter: Payer: Self-pay | Admitting: Gastroenterology

## 2022-12-24 VITALS — BP 107/74 | HR 64 | Temp 98.0°F | Resp 12 | Ht 63.0 in | Wt 188.0 lb

## 2022-12-24 DIAGNOSIS — K760 Fatty (change of) liver, not elsewhere classified: Secondary | ICD-10-CM

## 2022-12-24 DIAGNOSIS — R935 Abnormal findings on diagnostic imaging of other abdominal regions, including retroperitoneum: Secondary | ICD-10-CM

## 2022-12-24 DIAGNOSIS — K7460 Unspecified cirrhosis of liver: Secondary | ICD-10-CM

## 2022-12-24 DIAGNOSIS — I864 Gastric varices: Secondary | ICD-10-CM | POA: Diagnosis not present

## 2022-12-24 DIAGNOSIS — I8510 Secondary esophageal varices without bleeding: Secondary | ICD-10-CM | POA: Diagnosis not present

## 2022-12-24 DIAGNOSIS — K766 Portal hypertension: Secondary | ICD-10-CM | POA: Diagnosis not present

## 2022-12-24 DIAGNOSIS — R933 Abnormal findings on diagnostic imaging of other parts of digestive tract: Secondary | ICD-10-CM | POA: Diagnosis not present

## 2022-12-24 LAB — AFP TUMOR MARKER: AFP-Tumor Marker: 3.5 ng/mL

## 2022-12-24 LAB — PROTIME-INR
INR: 1.2 ratio — ABNORMAL HIGH (ref 0.8–1.0)
Prothrombin Time: 12.5 s (ref 9.6–13.1)

## 2022-12-24 MED ORDER — SODIUM CHLORIDE 0.9 % IV SOLN: 500.0000 mL | Freq: Once | INTRAVENOUS | Status: DC

## 2022-12-24 MED ORDER — NADOLOL 20 MG PO TABS
20.0000 mg | ORAL_TABLET | Freq: Every day | ORAL | 3 refills | Status: DC
  Filled 2022-12-24: qty 90, 90d supply, fill #0

## 2022-12-24 NOTE — Progress Notes (Signed)
Please refer to office visit note 12/22/22. No additional changes in H&P Patient is appropriate for planned procedure(s) and anesthesia in an ambulatory setting  K. Veena Allice Garro , MD [phone]   

## 2022-12-24 NOTE — Progress Notes (Signed)
Vss nad trans to pacu 

## 2022-12-24 NOTE — Op Note (Signed)
[facility] Patient Name: Dawn [name] Procedure Date: 12/24/2022 2:02 PM MRN: [id number] Endoscopist: Napoleon Form , MD, [id number] Age: 72 Referring MD:  Date of Birth: [birth year] Gender: Female Account #: [id number] Procedure:                Upper GI endoscopy Indications:              To evaluate esophageal varices in patient with                            suspected portal hypertension, Suspected esophageal                            varices in patient with suspected portal                            hypertension, Abnormal CT of the GI tract Medicines:                Monitored Anesthesia Care Procedure:                Pre-Anesthesia Assessment:                           - Prior to the procedure, a History and Physical                            was performed, and patient medications and                            allergies were reviewed. The patient's tolerance of                            previous anesthesia was also reviewed. The risks                            and benefits of the procedure and the sedation                            options and risks were discussed with the patient.                            All questions were answered, and informed consent                            was obtained. Prior Anticoagulants: The patient has                            taken no anticoagulant or antiplatelet agents. ASA                            Grade Assessment: III - A patient with severe                            systemic disease. After reviewing the risks and  benefits, the patient was deemed in satisfactory                            condition to undergo the procedure.                           After obtaining informed consent, the endoscope was                            passed under direct vision. Throughout the                            procedure, the patient's blood pressure, pulse, and                            oxygen  saturations were monitored continuously. The                            Olympus Scope [id number] was introduced through the                            mouth, and advanced to the second part of duodenum.                            The upper GI endoscopy was accomplished without                            difficulty. The patient tolerated the procedure                            well. Scope In: Scope Out: Findings:                 Grade I varices were found in the lower third of                            the esophagus. They were less than 1 mm in largest                            diameter.                           The Z-line was regular and was found 38 cm from the                            incisors.                           Type 2 gastroesophageal varices (GOV2, esophageal                            varices which extend along the fundus) with no                            bleeding were found in the gastric fundus.  There                            were no stigmata of recent bleeding. They were 20                            mm in largest diameter.                           Mild portal hypertensive gastropathy was found in                            the entire examined stomach.                           The examined duodenum was normal. Complications:            No immediate complications. Estimated Blood Loss:     Estimated blood loss was minimal. Impression:               - Grade I esophageal varices.                           - Z-line regular, 38 cm from the incisors.                           - Type 2 gastroesophageal varices (GOV2, esophageal                            varices which extend along the fundus), without                            bleeding.                           - Portal hypertensive gastropathy.                           - Normal examined duodenum.                           - No specimens collected. Recommendation:           - Patient has a contact number available  for                            emergencies. The signs and symptoms of potential                            delayed complications were discussed with the                            patient. Return to normal activities tomorrow.                            Written discharge instructions were provided to the  patient.                           - Resume previous diet.                           - Continue present medications.                           - Start Nadolol 20mg  daily. Rx for 90 days with 3                            refills                           - Check PT/INR                           - CT BRTO protocol                           - Return to GI office in 2-3 months, next available                            appointment. Napoleon Form, MD 12/24/2022 2:32:20 PM This report has been signed electronically.

## 2022-12-24 NOTE — Progress Notes (Signed)
Pt's states no medical or surgical changes since previsit or office visit. 

## 2022-12-24 NOTE — Patient Instructions (Addendum)
Please read handouts provided. Continue present medications. Start Nadolol 20 mg daily. Return to GI office in 2-3 months, next available appointment. Resume previous diet.  YOU HAD AN ENDOSCOPIC PROCEDURE TODAY AT THE [facility]:   Refer to the procedure report that was given to you for any specific questions about what was found during the examination.  If the procedure report does not answer your questions, please call your gastroenterologist to clarify.  If you requested that your care partner not be given the details of your procedure findings, then the procedure report has been included in a sealed envelope for you to review at your convenience later.  YOU SHOULD EXPECT: Some feelings of bloating in the abdomen. Passage of more gas than usual.  Walking can help get rid of the air that was put into your GI tract during the procedure and reduce the bloating. If you had a lower endoscopy (such as a colonoscopy or flexible sigmoidoscopy) you may notice spotting of blood in your stool or on the toilet paper. If you underwent a bowel prep for your procedure, you may not have a normal bowel movement for a few days.  Please Note:  You might notice some irritation and congestion in your nose or some drainage.  This is from the oxygen used during your procedure.  There is no need for concern and it should clear up in a day or so.  SYMPTOMS TO REPORT IMMEDIATELY:   Following upper endoscopy (EGD)  Vomiting of blood or coffee ground material  New chest pain or pain under the shoulder blades  Painful or persistently difficult swallowing  New shortness of breath  Fever of 100F or higher  Black, tarry-looking stools  For urgent or emergent issues, a gastroenterologist can be reached at any hour by calling [phone]. Do not use MyChart messaging for urgent concerns.    DIET:  We do recommend a small meal at first, but then you may proceed to your regular diet.  Drink plenty of  fluids but you should avoid alcoholic beverages for 24 hours.  ACTIVITY:  You should plan to take it easy for the rest of today and you should NOT DRIVE or use heavy machinery until tomorrow (because of the sedation medicines used during the test).    FOLLOW UP: Our staff will call the number listed on your records the next business day following your procedure.  We will call around 7:15- 8:00 am to check on you and address any questions or concerns that you may have regarding the information given to you following your procedure. If we do not reach you, we will leave a message.     If any biopsies were taken you will be contacted by phone or by letter within the next 1-3 weeks.  Please call us at [phone] if you have not heard about the biopsies in 3 weeks.    SIGNATURES/CONFIDENTIALITY: You and/or your care partner have signed paperwork which will be entered into your electronic medical record.  These signatures attest to the fact that that the information above on your After Visit Summary has been reviewed and is understood.  Full responsibility of the confidentiality of this discharge information lies with you and/or your care-partner.

## 2022-12-25 ENCOUNTER — Telehealth: Payer: Self-pay

## 2022-12-25 LAB — MITOCHONDRIAL ANTIBODIES: Mitochondrial M2 Ab, IgG: <=20 U (ref <=20.0)

## 2022-12-25 LAB — ALPHA-1-ANTITRYPSIN: A-1 Antitrypsin, Ser: 119 mg/dL (ref 83–199)

## 2022-12-25 NOTE — Telephone Encounter (Signed)
  Follow up Call-     12/24/2022    1:38 PM  Call back number  Post procedure Call Back phone  # [phone]  Permission to leave phone message Yes    Post op call attempted, no answer, left WM.

## 2022-12-26 ENCOUNTER — Other Ambulatory Visit: Payer: Self-pay | Admitting: *Deleted

## 2022-12-26 ENCOUNTER — Telehealth: Payer: Self-pay | Admitting: Gastroenterology

## 2022-12-26 DIAGNOSIS — I8510 Secondary esophageal varices without bleeding: Secondary | ICD-10-CM

## 2022-12-26 DIAGNOSIS — K7581 Nonalcoholic steatohepatitis (NASH): Secondary | ICD-10-CM

## 2022-12-26 DIAGNOSIS — I864 Gastric varices: Secondary | ICD-10-CM

## 2022-12-26 DIAGNOSIS — K7460 Unspecified cirrhosis of liver: Secondary | ICD-10-CM

## 2022-12-26 NOTE — Telephone Encounter (Signed)
Dawn Ball do you know what this is?

## 2022-12-26 NOTE — Telephone Encounter (Signed)
PT wants to schedule a CT -BRTO protocol. She reached out to radiology scheduling and they need an order put in

## 2022-12-26 NOTE — Telephone Encounter (Signed)
I got with Dr Anner Crete and its a CT Abdomen/Pelvis with contrast  Scheduling comments BRTO Attention Interventional radiology   I placed the order for the patient and called her to inform her the orders are in.

## 2022-12-29 ENCOUNTER — Ambulatory Visit (HOSPITAL_COMMUNITY)
Admission: RE | Admit: 2022-12-29 | Discharge: 2022-12-29 | Disposition: A | Discharge status: Home / Self Care | Payer: PPO | Source: Ambulatory Visit | Attending: Gastroenterology | Admitting: Gastroenterology

## 2022-12-29 DIAGNOSIS — R7989 Other specified abnormal findings of blood chemistry: Secondary | ICD-10-CM | POA: Diagnosis not present

## 2022-12-29 DIAGNOSIS — K760 Fatty (change of) liver, not elsewhere classified: Secondary | ICD-10-CM | POA: Insufficient documentation

## 2022-12-29 DIAGNOSIS — R932 Abnormal findings on diagnostic imaging of liver and biliary tract: Secondary | ICD-10-CM | POA: Diagnosis not present

## 2023-01-05 ENCOUNTER — Encounter: Payer: Self-pay | Admitting: Family Medicine

## 2023-01-05 ENCOUNTER — Other Ambulatory Visit (HOSPITAL_COMMUNITY): Payer: Self-pay

## 2023-01-05 ENCOUNTER — Ambulatory Visit (INDEPENDENT_AMBULATORY_CARE_PROVIDER_SITE_OTHER): Payer: PPO | Admitting: Family Medicine

## 2023-01-05 VITALS — BP 128/64 | HR 62 | Ht 63.0 in | Wt 187.0 lb

## 2023-01-05 DIAGNOSIS — I10 Essential (primary) hypertension: Secondary | ICD-10-CM

## 2023-01-05 DIAGNOSIS — K760 Fatty (change of) liver, not elsewhere classified: Secondary | ICD-10-CM

## 2023-01-05 DIAGNOSIS — R7301 Impaired fasting glucose: Secondary | ICD-10-CM

## 2023-01-05 LAB — POCT GLYCOSYLATED HEMOGLOBIN (HGB A1C): Hemoglobin A1C: 5.6 % (ref 4.0–5.6)

## 2023-01-05 MED ORDER — OLMESARTAN MEDOXOMIL-HCTZ 20-12.5 MG PO TABS
1.0000 | ORAL_TABLET | Freq: Every day | ORAL | 3 refills | Status: DC
Start: 2023-01-05 — End: 2024-03-26
  Filled 2023-01-05 – 2023-03-16 (×3): qty 90, 90d supply, fill #0
  Filled 2023-06-15: qty 90, 90d supply, fill #1
  Filled 2023-09-13: qty 90, 90d supply, fill #2
  Filled 2023-12-09: qty 90, 90d supply, fill #3

## 2023-01-05 NOTE — Progress Notes (Signed)
Established Patient Office Visit  Subjective   Patient ID: Dawn [name], female    DOB: Feb 22, 1951  Age: 72 y.o. MRN: [id number]  Chief Complaint  Patient presents with   Hypertension    HPI Impaired fasting glucose-no increased thirst or urination. No symptoms consistent with hypoglycemia.  Hypertension- Pt denies chest pain, SOB, dizziness, or heart palpitations.  Taking meds as directed w/o problems.  Denies medication side effects.    Also wanted to update me about recent findings.  She had a CT of the chest and abdomen done March 27 which noted hepatic morphology possibly consistent with cirrhosis and possible gastric varices.  She had a follow-up ultrasound which was more indicative of fatty liver and hepatomegaly but they did move forward with endoscopy and she did have some gastric varices.  Since she has been started on nadolol.  Scheduled her for CT abdomen pelvis with contrast next week.  She has had normal liver enzymes recently and with her prior history of breast cancer she is concerned.    ROS    Objective:     BP 128/64   Pulse 62   Ht  (1.6 m)   Wt 187 lb (84.8 kg)   SpO2 96%   BMI 33.13 kg/m    Physical Exam Vitals and nursing note reviewed.  Constitutional:      Appearance: She is well-developed.  HENT:     Head: Normocephalic and atraumatic.  Cardiovascular:     Rate and Rhythm: Normal rate and regular rhythm.     Heart sounds: Normal heart sounds.  Pulmonary:     Effort: Pulmonary effort is normal.     Breath sounds: Normal breath sounds.  Skin:    General: Skin is warm and dry.  Neurological:     Mental Status: She is alert and oriented to person, place, and time.  Psychiatric:        Behavior: Behavior normal.      Results for orders placed or performed in visit on 01/05/23  POCT glycosylated hemoglobin (Hb A1C)  Result Value Ref Range   Hemoglobin A1C 5.6 4.0 - 5.6 %   HbA1c POC (<> result, manual entry)     HbA1c, POC  (prediabetic range)     HbA1c, POC (controlled diabetic range)        The 10-year ASCVD risk score (Arnett DK, et al., 2019) is: 12%    Assessment & Plan:   Problem List Items Addressed This Visit       Cardiovascular and Mediastinum   HYPERTENSION, BENIGN - Primary    Well controlled. Continue current regimen. Follow up in  75mo       Relevant Medications   olmesartan-hydrochlorothiazide (BENICAR HCT) 20-12.5 MG tablet     Digestive   Fatty liver    Discussed continuing to work on healthy food choices cutting back on fast food and fried foods and eating more fresh vegetables and lean proteins.  Continuing to work on weight loss.  I would love to see her get down to about 165 pounds.  Continue statin as there are some studies showing some benefit with being on a statin and having fatty liver.  There can do some additional workup for the varices I think is important to make sure that her hepatitis vaccines are up-to-date.        Endocrine   IFG (impaired fasting glucose)    Well controlled. Continue current regimen. Follow up in  75mo  Lab Results  Component Value Date   HGBA1C 5.6 01/05/2023       Relevant Orders   POCT glycosylated hemoglobin (Hb A1C) (Completed)    Return in about 6 months (around 07/07/2023) for BP/IFG.    Nani Gasser, MD

## 2023-01-05 NOTE — Assessment & Plan Note (Addendum)
Discussed continuing to work on healthy food choices cutting back on fast food and fried foods and eating more fresh vegetables and lean proteins.  Continuing to work on weight loss.  I would love to see her get down to about 165 pounds.  Continue statin as there are some studies showing some benefit with being on a statin and having fatty liver.  There can do some additional workup for the varices I think is important to make sure that her hepatitis vaccines are up-to-date.

## 2023-01-05 NOTE — Assessment & Plan Note (Addendum)
Well controlled. Continue current regimen. Follow up in  6 mo  

## 2023-01-05 NOTE — Assessment & Plan Note (Signed)
Well controlled. Continue current regimen. Follow up in  67mo   Lab Results  Component Value Date   HGBA1C 5.6 01/05/2023

## 2023-01-06 ENCOUNTER — Other Ambulatory Visit (HOSPITAL_COMMUNITY): Payer: Self-pay

## 2023-01-09 ENCOUNTER — Other Ambulatory Visit: Payer: Self-pay

## 2023-01-12 ENCOUNTER — Ambulatory Visit (HOSPITAL_COMMUNITY)
Admission: RE | Admit: 2023-01-12 | Discharge: 2023-01-12 | Disposition: A | Discharge status: Home / Self Care | Payer: PPO | Source: Ambulatory Visit | Attending: Gastroenterology | Admitting: Gastroenterology

## 2023-01-12 ENCOUNTER — Encounter (HOSPITAL_COMMUNITY): Payer: Self-pay

## 2023-01-12 ENCOUNTER — Other Ambulatory Visit (HOSPITAL_BASED_OUTPATIENT_CLINIC_OR_DEPARTMENT_OTHER): Payer: Self-pay

## 2023-01-12 ENCOUNTER — Other Ambulatory Visit: Payer: Self-pay | Admitting: Gastroenterology

## 2023-01-12 DIAGNOSIS — K7460 Unspecified cirrhosis of liver: Secondary | ICD-10-CM | POA: Insufficient documentation

## 2023-01-12 DIAGNOSIS — I864 Gastric varices: Secondary | ICD-10-CM

## 2023-01-12 DIAGNOSIS — K7581 Nonalcoholic steatohepatitis (NASH): Secondary | ICD-10-CM | POA: Insufficient documentation

## 2023-01-12 DIAGNOSIS — I8510 Secondary esophageal varices without bleeding: Secondary | ICD-10-CM

## 2023-01-12 DIAGNOSIS — R160 Hepatomegaly, not elsewhere classified: Secondary | ICD-10-CM | POA: Diagnosis not present

## 2023-01-12 DIAGNOSIS — K449 Diaphragmatic hernia without obstruction or gangrene: Secondary | ICD-10-CM | POA: Diagnosis not present

## 2023-01-12 MED ORDER — IOHEXOL 350 MG/ML SOLN
100.0000 mL | Freq: Once | INTRAVENOUS | Status: AC | PRN
  Administered 2023-01-12: 100 mL via INTRAVENOUS

## 2023-01-12 MED ORDER — SODIUM CHLORIDE (PF) 0.9 % IJ SOLN
INTRAMUSCULAR | Status: AC
  Filled 2023-01-12: qty 50

## 2023-01-13 ENCOUNTER — Other Ambulatory Visit: Payer: Self-pay

## 2023-01-13 ENCOUNTER — Telehealth: Payer: Self-pay

## 2023-01-13 DIAGNOSIS — I864 Gastric varices: Secondary | ICD-10-CM

## 2023-01-13 NOTE — Telephone Encounter (Signed)
Referral to [facility] placed.

## 2023-01-13 NOTE — Telephone Encounter (Signed)
-----   Message from Napoleon Form, MD sent at 01/13/2023 12:11 PM EDT ----- Regarding: RE: gastric varices Agree, clinically she does not have any features of cirrhosis and liver FibroScan did not show any significant fibrosis either.  I discussed possible BRTO with patient and she is willing to proceed. Will send referral to IR portal hypertension clinic.  Appreciate your help! Thank you Scherry Ran  ----- Message ----- From: Bennie Dallas, MD Sent: 01/13/2023   8:33 AM EDT To: Mickie Kay, NP; Napoleon Form, MD Subject: gastric varices                                Dr. Lavon Paganini,  I just read Ms. Montoya CT.  I'm not impressed by the morphology of her liver for cirrhosis, but she does have prominent gastric varices, as it looks like you saw endoscopically, draining via a gastrorenal shunt.  Given the absence of significant esophageal varices, from an imaging perspective she would be an excellent candidate for transvenous obliteration (BRTO).  We'd be happy to see her in our portal hypertension clinic if you'd like.  Best, [employer]

## 2023-01-14 ENCOUNTER — Other Ambulatory Visit (HOSPITAL_BASED_OUTPATIENT_CLINIC_OR_DEPARTMENT_OTHER): Payer: Self-pay

## 2023-01-26 DIAGNOSIS — H43813 Vitreous degeneration, bilateral: Secondary | ICD-10-CM | POA: Diagnosis not present

## 2023-01-26 DIAGNOSIS — H524 Presbyopia: Secondary | ICD-10-CM | POA: Diagnosis not present

## 2023-01-26 DIAGNOSIS — H18513 Endothelial corneal dystrophy, bilateral: Secondary | ICD-10-CM | POA: Diagnosis not present

## 2023-01-26 DIAGNOSIS — H5213 Myopia, bilateral: Secondary | ICD-10-CM | POA: Diagnosis not present

## 2023-01-26 DIAGNOSIS — Z961 Presence of intraocular lens: Secondary | ICD-10-CM | POA: Diagnosis not present

## 2023-01-27 ENCOUNTER — Ambulatory Visit
Admission: RE | Admit: 2023-01-27 | Discharge: 2023-01-27 | Disposition: A | Discharge status: Home / Self Care | Payer: PPO | Source: Ambulatory Visit | Attending: Gastroenterology | Admitting: Gastroenterology

## 2023-01-27 DIAGNOSIS — I864 Gastric varices: Secondary | ICD-10-CM

## 2023-01-27 DIAGNOSIS — K766 Portal hypertension: Secondary | ICD-10-CM | POA: Diagnosis not present

## 2023-01-27 HISTORY — PX: IR RADIOLOGIST EVAL & MGMT: IMG5224

## 2023-01-27 NOTE — Consult Note (Signed)
Chief Complaint: Patient was seen in virtual telephone consultation today for portal hypertension  Referring Physician(s): Nandigam,Kavitha V  History of Present Illness: Dawn [name] is a 72 y.o. female with a medical history significant for breast and endometrial cancer (2018; in remission), HTN, erosive esophagitis and diverticulosis.  Cancer surveillance imaging March 2024 was negative for evidence of metastatic disease but did identify cirrhosis and gastric varices. She was referred to GI and she subsequently underwent EGD and RUQ ultrasound with elastography evaluation.  The EGD identified varices and portal hypertensive gastropathy. Ultrasound showed fatty liver.  CTA imaging identified a left retroperitoneal spleno-gastro-renal venous shunt with large (up to 2.0 cm) gastric varices. The patient was referred to Interventional Radiology to discuss possible treatment and management options. She presents today via tele-health visit.     She denies history of hematemesis or blood in her stool.  She has never had ascites.  Cirrhosis workup remains unclear, with below threshold measurements on elastography, but some mild morphologic changes on cross sectional imaging.  She denies abdominal pain, dysphagia, change in bowel habits.  With her oncologic history, she was on several different chemotherapy agents.  She has started naldol for bleeding prophylaxis, and states that this has made her feel very fatigued.     Past Medical History:  Diagnosis Date   Allergy 2018   Anemia    as a child.   Arthritis    Atypical mole 06/04/2018   moderate top of right foot   Atypical mole 06/04/2018   left elbow mild   Atypical mole 06/04/2018   left upper back mild   Bilateral cataracts    Bilateral leg cramps    Cancer (HCC)    skin   Dyspnea    Endometrial cancer (HCC)    Family history of bladder cancer    Family history of colon cancer in father    Fatty liver    Fuchs' corneal  dystrophy    GERD (gastroesophageal reflux disease)    Hearing loss    Right side 30%   Heart murmur 1960   Heartburn    History of hiatal hernia    small size   History of radiation therapy 08/21/17, 08/28/17,09/01/17, 09/11/17, 09/17/17   Vaginal cuff brachytherapy.    History of radiation therapy 11/11/17- 12/08/17   Right Breast treated to 40.05 Gy with 15 fx of 2.67 Gy and a boost of 10 Gy with 5 fx.    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   Uterine cancer (HCC) 04/2017   endometrial cancer   Varices, gastric    Vertigo     Past Surgical History:  Procedure Laterality Date   ABDOMINAL HYSTERECTOMY  2018   BREAST BIOPSY Right 04/27/2017   BREAST LUMPECTOMY WITH RADIOACTIVE SEED AND SENTINEL LYMPH NODE BIOPSY Right 05/25/2017   Procedure: RIGHT BREAST LUMPECTOMY WITH RADIOACTIVE SEED AND  RIGHT AXILLARY SENTINEL LYMPH NODE BIOPSY;  Surgeon: Glenna Fellows, MD;  Location: [facility];  Service: General;  Laterality: Right;   CATARACT  EXTRACTION, BILATERAL     COLONOSCOPY     EYE SURGERY  2018   HYSTEROSCOPY WITH D & C N/A 04/29/2017   Procedure: DILATATION AND CURETTAGE /HYSTEROSCOPY;  Surgeon: Mitchel Honour, DO;  Location: [facility];  Service: Gynecology;  Laterality: N/A;   PORT-A-CATH REMOVAL Left 04/16/2021   Procedure: REMOVAL PORT-A-CATH;  Surgeon: Luretha Murphy, MD;  Location: [facility];  Service: General;  Laterality: Left;   PORTACATH PLACEMENT Left 05/25/2017   Procedure: INSERTION PORT-A-CATH;  Surgeon: Glenna Fellows, MD;  Location: [facility];  Service: General;  Laterality: Left;   ROBOTIC ASSISTED TOTAL HYSTERECTOMY WITH BILATERAL SALPINGO OOPHERECTOMY N/A 06/09/2017   Procedure: XI ROBOTIC ASSISTED TOTAL LAPOROSCOPIC HYSTERECTOMY WITH BILATERAL SALPINGO OOPHORECTOMY;  Surgeon: Adolphus Birchwood, MD;  Location: [facility];  Service: Gynecology;  Laterality: N/A;   SENTINEL NODE BIOPSY N/A 06/09/2017   Procedure: SENTINEL NODE BIOPSY;  Surgeon: Adolphus Birchwood, MD;  Location: [facility];  Service: Gynecology;  Laterality: N/A;    Allergies: Ciprofloxacin, Paclitaxel, Crestor [rosuvastatin calcium], Fosamax [alendronate sodium], Livalo [pitavastatin], and Penicillins  Medications: Prior to Admission medications   Medication Sig Start Date End Date Taking? Authorizing Provider  acetaminophen (TYLENOL) 500 MG tablet Take 500 mg by mouth every 6 (six) hours as needed for moderate pain.    [provider]  Alpha-Lipoic Acid 600 MG CAPS Take 1 capsule (600 mg total) by mouth daily. 06/28/20   Monica Becton, MD  anastrozole (ARIMIDEX) 1 MG tablet Take 1 tablet (1 mg total) by mouth daily. 11/17/22   Malachy Mood, MD  Ascorbic Acid (VITAMIN C) 1000 MG tablet Take 1,000 mg by mouth daily.    [provider]  atorvastatin (LIPITOR) 20 MG tablet Take 1 tablet (20 mg total) by mouth daily. 04/09/22   Agapito Games, MD  b complex vitamins capsule Take 1 capsule by mouth daily.    [provider]  Cholecalciferol (VITAMIN D3) 75 MCG (3000 UT) TABS Take 1 capsule by mouth daily.    [provider]  fluticasone (FLONASE) 50 MCG/ACT nasal spray Place 2 sprays into both nostrils daily. 12/18/22   Agapito Games, MD  levothyroxine (SYNTHROID) 50 MCG tablet TAKE 1 TABLET BY MOUTH  DAILY 12/30/21   Agapito Games, MD  Multiple Vitamin (MULTIVITAMIN) capsule Take 1 capsule by mouth daily.    [provider]  nadolol (CORGARD) 20 MG tablet Take 1 tablet (20 mg total) by mouth daily. 12/24/22    Napoleon Form, MD  olmesartan-hydrochlorothiazide (BENICAR HCT) 20-12.5 MG tablet Take 1 tablet by mouth daily. 01/05/23   Agapito Games, MD  pantoprazole (PROTONIX) 20 MG tablet TAKE 1 TABLET BY MOUTH  DAILY--need follow up appointment for further refills [phone] 09/18/22   Pyrtle, Carie Caddy, MD  pyridOXINE (VITAMIN B-6) 50 MG tablet Take 1 tablet (50 mg total) by mouth daily. 06/28/20   Monica Becton, MD  triamcinolone cream (KENALOG) 0.1 % Apply 1 application topically 2 (two) times daily. Patient taking differently: Apply 1 Application topically as needed. 08/05/22   Charlton Amor, DO     Family History  Problem Relation Age of Onset   Stroke Mother 54   Depression Mother    Anxiety disorder Mother    Bipolar disorder Mother    Obesity Mother    Colon cancer Father 42   Heart attack Father 54   Prostate cancer Father        dx 54's  Bladder Cancer Father 82   Hypertension Father    Hyperlipidemia Father    Heart disease Father    Obesity Father    Hyperlipidemia Sister    Asthma Brother    Hyperlipidemia Brother    Heart attack Maternal Grandmother 25   Heart disease Maternal Grandmother    Diabetes Maternal Grandfather    Kidney disease Maternal Grandfather    Cancer Paternal Grandmother 62       GYN cancer ( thinks ovarian, maybe uterine)   Anxiety disorder Daughter    Anxiety disorder Son    Breast cancer Other 40   Colon cancer Other 26   Cancer Other        type unk, age dx unk   Esophageal cancer Neg Hx    Stomach cancer Neg Hx     Social History   Socioeconomic History   Marital status: Married    Spouse name: Maurine Minister   Number of children: 2   Years of education: 16   Highest education level: Bachelor's degree (e.g., BA, AB, BS)  Occupational History    Employer: [employer]    Comment: [employer]   Tobacco Use   Smoking status: Never   Smokeless tobacco: Never  Vaping Use   Vaping Use: Never used   Substance and Sexual Activity   Alcohol use: Not Currently    Comment: <1/week wine or beer occasional   Drug use: No   Sexual activity: Yes    Birth control/protection: Post-menopausal  Other Topics Concern   Not on file  Social History Narrative   Lives with her husband. Still works part time. She enjoys hiking, walking and gardening.     Social Determinants of Health   Financial Resource Strain: Low Risk  (11/20/2022)   Overall Financial Resource Strain (CARDIA)    Difficulty of Paying Living Expenses: Not hard at all  Food Insecurity: No Food Insecurity (01/04/2023)   Hunger Vital Sign    Worried About Running Out of Food in the Last Year: Never true    Ran Out of Food in the Last Year: Never true  Transportation Needs: No Transportation Needs (01/04/2023)   PRAPARE - Administrator, Civil Service (Medical): No    Lack of Transportation (Non-Medical): No  Physical Activity: Sufficiently Active (01/04/2023)   Exercise Vital Sign    Days of Exercise per Week: 4 days    Minutes of Exercise per Session: 60 min  Stress: No Stress Concern Present (01/04/2023)   [employer] of Occupational Health - Occupational Stress Questionnaire    Feeling of Stress : Only a little  Social Connections: Socially Integrated (01/04/2023)   Social Connection and Isolation Panel [NHANES]    Frequency of Communication with Friends and Family: Twice a week    Frequency of Social Gatherings with Friends and Family: Once a week    Attends Religious Services: More than 4 times per year    Active Member of [employer] or Organizations: Yes    Attends Engineer, structural: More than 4 times per year    Marital Status: Married    Review of Systems: A 12 point ROS discussed and pertinent positives are indicated in the HPI above.  All other systems are negative.  Vital Signs: There were no vitals taken for this visit.  Advance Care Plan: The advanced care plan/surrogate decision maker  was discussed at the time of visit and documented in the medical record.   No  physical examination was performed in lieu of virtual telephone clinic visit.  Imaging: CTA AP 01/12/23  Minimal/no signs of cirrhosis.  Patent portal system.  Large gastric varices in the fundus with spleno-gastro-renal portosystemic shunt.  No ectopic varices or ascites.  [name] Elastography (12/29/22) IMPRESSION: ULTRASOUND RUQ: Fatty liver infiltration. ULTRASOUND HEPATIC ELASTOGRAPHY: Median kPa: 4.1 Diagnostic category: < or = 5 kPa: high probability of being normal    EGD 12/24/22   Labs:  CBC: Recent Labs    05/21/22 0819 12/10/22 1330  WBC 4.4 5.4  HGB 14.0 14.4  HCT 39.3 39.3  PLT 184 190    COAGS: Recent Labs    12/24/22 1530  INR 1.2*    BMP: Recent Labs    05/21/22 0819 12/10/22 1330  NA 137 138  K 3.5 3.5  CL 103 102  CO2 27 28  GLUCOSE 147* 112*  BUN 13 12  CALCIUM 9.6 9.6  CREATININE 0.80 0.76  GFRNONAA >60 >60    LIVER FUNCTION TESTS: Recent Labs    05/21/22 0819 12/10/22 1330  BILITOT 0.7 0.7  AST 25 39  ALT 41 53*  ALKPHOS 36* 33*  PROT 7.5 7.7  ALBUMIN 4.5 4.4    TUMOR MARKERS: Recent Labs    12/22/22 1426  AFPTM 3.5   Child Pugh A5 MELD 8 FIPS -1.12   Assessment and Plan: 72 year old female with history of breast and endometrial cancer, treated, with recent diagnosis of gastric varices and possible cirrhosis noted on recent oncologic surveillance imaging.  Further workup with CTA abdomen/pelvis, endoscopy, and [name] elastography remain indeterminate as far as cirrhosis diagnosis, however large gastric varices are present.  Given spleno-gastro-renal shunt, she is from an anatomical standpoint and excellent candidate for retrograde transvenous obliteration of the gastric varices for bleeding prophylaxis.  We could consider transjugular liver biopsy with portal pressure measurements at this time for further diagnostic evaluation.  She is amenable to  this plan.  Plan for retrograde transvenous obliteration of gastric varices at [facility] with moderate sedation.  I will reach out to Dr. Lavon Paganini regarding doing transjugular liver biopsy at the time of transvenous obliteration.   Marliss Coots, MD Pager: [phone] Clinic: [phone]    I spent a total of 40 Minutes in virtual telephone clinical consultation, greater than 50% of which was counseling/coordinating care for portal hypertension.

## 2023-01-30 ENCOUNTER — Other Ambulatory Visit (HOSPITAL_COMMUNITY): Payer: Self-pay

## 2023-01-30 ENCOUNTER — Other Ambulatory Visit: Payer: Self-pay

## 2023-01-30 ENCOUNTER — Other Ambulatory Visit: Payer: Self-pay | Admitting: Family Medicine

## 2023-01-30 DIAGNOSIS — E039 Hypothyroidism, unspecified: Secondary | ICD-10-CM

## 2023-01-30 MED ORDER — LEVOTHYROXINE SODIUM 50 MCG PO TABS
ORAL_TABLET | ORAL | 3 refills | Status: DC
Start: 2023-01-30 — End: 2024-01-24
  Filled 2023-01-30: qty 90, 90d supply, fill #0
  Filled 2023-05-02: qty 90, 90d supply, fill #1
  Filled 2023-07-30: qty 90, 90d supply, fill #2
  Filled 2023-10-26: qty 90, 90d supply, fill #3

## 2023-02-03 ENCOUNTER — Telehealth (HOSPITAL_COMMUNITY): Payer: Self-pay | Admitting: Radiology

## 2023-02-03 ENCOUNTER — Other Ambulatory Visit (HOSPITAL_COMMUNITY): Payer: Self-pay | Admitting: Interventional Radiology

## 2023-02-03 DIAGNOSIS — K766 Portal hypertension: Secondary | ICD-10-CM

## 2023-02-03 NOTE — Telephone Encounter (Signed)
Called pt to set up BRTO and transjugular liver bx with Dr. Marliss Coots for 6/3. Left VM for her to call back to schedule. JM

## 2023-02-04 DIAGNOSIS — D225 Melanocytic nevi of trunk: Secondary | ICD-10-CM | POA: Diagnosis not present

## 2023-02-04 DIAGNOSIS — L821 Other seborrheic keratosis: Secondary | ICD-10-CM | POA: Diagnosis not present

## 2023-02-04 DIAGNOSIS — Z85828 Personal history of other malignant neoplasm of skin: Secondary | ICD-10-CM | POA: Diagnosis not present

## 2023-02-04 DIAGNOSIS — L570 Actinic keratosis: Secondary | ICD-10-CM | POA: Diagnosis not present

## 2023-02-04 DIAGNOSIS — D220 Melanocytic nevi of lip: Secondary | ICD-10-CM | POA: Diagnosis not present

## 2023-02-04 DIAGNOSIS — L820 Inflamed seborrheic keratosis: Secondary | ICD-10-CM | POA: Diagnosis not present

## 2023-02-04 DIAGNOSIS — D1801 Hemangioma of skin and subcutaneous tissue: Secondary | ICD-10-CM | POA: Diagnosis not present

## 2023-02-04 DIAGNOSIS — L814 Other melanin hyperpigmentation: Secondary | ICD-10-CM | POA: Diagnosis not present

## 2023-02-10 ENCOUNTER — Other Ambulatory Visit: Payer: Self-pay | Admitting: Internal Medicine

## 2023-02-11 ENCOUNTER — Other Ambulatory Visit: Payer: Self-pay

## 2023-02-11 ENCOUNTER — Other Ambulatory Visit (HOSPITAL_COMMUNITY): Payer: Self-pay

## 2023-02-11 MED ORDER — PANTOPRAZOLE SODIUM 20 MG PO TBEC
20.0000 mg | DELAYED_RELEASE_TABLET | Freq: Every day | ORAL | 0 refills | Status: DC
  Filled 2023-02-11: qty 90, 90d supply, fill #0

## 2023-02-13 ENCOUNTER — Other Ambulatory Visit: Payer: Self-pay | Admitting: Radiology

## 2023-02-13 DIAGNOSIS — K766 Portal hypertension: Secondary | ICD-10-CM

## 2023-02-15 ENCOUNTER — Encounter (HOSPITAL_COMMUNITY): Payer: Self-pay

## 2023-02-15 NOTE — H&P (Incomplete)
Chief Complaint: Cirrhosis with gastric varices. Patient presents for BRTO with transjugular liver biopsy with pressures.   Referring Physician(s): Dr. Lavon Paganini  Supervising Physician: Marliss Coots  Patient Status: [facility] - Out-pt  History of Present Illness: Dawn [name] is a 72 y.o. female outpatient. History of OA, breast cancer, IBS, HTN. HLD, GERD, endometrial cancer, diverticulitis. Found to  cirrhosis and gastric varices on surveillance screening. 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 CT Abd pelvis from 4.19.24 reads Left retroperitoneal splenorenal venous shunt with gastric varices.  The Patient was seen for consultation in the Portal Hypertension Clinic on 5.14.24 with IR Attending Dr. Marliss Coots. The Patient's clinical picture was discussed. After frank  evaluation regarding treatment options and possible outcomes the patent decided to proceed with retrograde transvenous obliteration of the gastric varices for bleeding prophylaxis. We could consider transjugular liver biopsy with portal pressure measurements    Past Medical History:  Diagnosis Date   Allergy 2018   Anemia    as a child.   Arthritis    Atypical mole 06/04/2018   moderate top of right foot   Atypical mole 06/04/2018   left elbow mild   Atypical mole 06/04/2018   left upper back mild   Bilateral cataracts    Bilateral leg cramps    Cancer (HCC)    skin   Dyspnea    Endometrial cancer (HCC)    Family history of bladder cancer    Family history of colon cancer in father    Fatty liver    Fuchs' corneal dystrophy    GERD (gastroesophageal reflux disease)    Hearing loss    Right side 30%   Heart murmur 1960   Heartburn    History of hiatal hernia    small size   History of radiation therapy 08/21/17, 08/28/17,09/01/17, 09/11/17, 09/17/17   Vaginal cuff brachytherapy.    History of radiation therapy 11/11/17-  12/08/17   Right Breast treated to 40.05 Gy with 15 fx of 2.67 Gy and a boost of 10 Gy with 5 fx.    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   Uterine cancer (HCC) 04/2017   endometrial cancer   Varices, gastric    Vertigo     Past Surgical History:  Procedure Laterality Date   ABDOMINAL HYSTERECTOMY  2018   BREAST BIOPSY Right 04/27/2017   BREAST LUMPECTOMY WITH RADIOACTIVE SEED AND SENTINEL LYMPH NODE BIOPSY Right 05/25/2017   Procedure: RIGHT BREAST LUMPECTOMY WITH RADIOACTIVE SEED AND  RIGHT AXILLARY SENTINEL LYMPH NODE BIOPSY;  Surgeon: Glenna Fellows, MD;  Location:  SURGERY C[facility];  Service: General;  Laterality: Right;   CATARACT EXTRACTION, BILATERAL     COLONOSCOPY     EYE SURGERY  2018   HYSTEROSCOPY WITH D & C N/A 04/29/2017   Procedure: DILATATION AND CURETTAGE /HYSTEROSCOPY;  Surgeon: Mitchel Honour, DO;  Location: [facility];  Service: Gynecology;  Laterality: N/A;   IR RADIOLOGIST EVAL & MGMT  01/27/2023  PORT-A-CATH REMOVAL Left 04/16/2021   Procedure: REMOVAL PORT-A-CATH;  Surgeon: Luretha Murphy, MD;  Location: [facility];  Service: General;  Laterality: Left;   PORTACATH PLACEMENT Left 05/25/2017   Procedure: INSERTION PORT-A-CATH;  Surgeon: Glenna Fellows, MD;  Location: [facility];  Service: General;  Laterality: Left;   ROBOTIC ASSISTED TOTAL HYSTERECTOMY WITH BILATERAL SALPINGO OOPHERECTOMY N/A 06/09/2017   Procedure: XI ROBOTIC ASSISTED TOTAL LAPOROSCOPIC HYSTERECTOMY WITH BILATERAL SALPINGO OOPHORECTOMY;  Surgeon: Adolphus Birchwood, MD;   Location: [facility];  Service: Gynecology;  Laterality: N/A;   SENTINEL NODE BIOPSY N/A 06/09/2017   Procedure: SENTINEL NODE BIOPSY;  Surgeon: Adolphus Birchwood, MD;  Location: [facility];  Service: Gynecology;  Laterality: N/A;    Allergies: Ciprofloxacin, Paclitaxel, Crestor [rosuvastatin calcium], Fosamax [alendronate sodium], Livalo [pitavastatin], and Penicillins  Medications: Prior to Admission medications   Medication Sig Start Date End Date Taking? Authorizing Provider  acetaminophen (TYLENOL) 500 MG tablet Take 500 mg by mouth every 6 (six) hours as needed for moderate pain.    [provider]  Alpha-Lipoic Acid 600 MG CAPS Take 1 capsule (600 mg total) by mouth daily. 06/28/20   Monica Becton, MD  anastrozole (ARIMIDEX) 1 MG tablet Take 1 tablet (1 mg total) by mouth daily. 11/17/22   Malachy Mood, MD  Ascorbic Acid (VITAMIN C) 1000 MG tablet Take 1,000 mg by mouth daily.    [provider]  atorvastatin (LIPITOR) 20 MG tablet Take 1 tablet (20 mg total) by mouth daily. 04/09/22   Agapito Games, MD  b complex vitamins capsule Take 1 capsule by mouth daily.    [provider]  Cholecalciferol (VITAMIN D3) 75 MCG (3000 UT) TABS Take 1 capsule by mouth daily.    [provider]  fluticasone (FLONASE) 50 MCG/ACT nasal spray Place 2 sprays into both nostrils daily. 12/18/22   Agapito Games, MD  levothyroxine (SYNTHROID) 50 MCG tablet TAKE 1 TABLET BY MOUTH  DAILY 01/30/23   Agapito Games, MD  Multiple Vitamin (MULTIVITAMIN) capsule Take 1 capsule by mouth daily.    [provider]  nadolol (CORGARD) 20 MG tablet Take 1 tablet (20 mg total) by mouth daily. 12/24/22   Napoleon Form, MD  olmesartan-hydrochlorothiazide (BENICAR HCT) 20-12.5 MG tablet Take 1 tablet by mouth daily. 01/05/23   Agapito Games, MD  pantoprazole (PROTONIX) 20 MG tablet Take 1 tablet (20 mg total) by mouth daily. 02/11/23   Napoleon Form, MD   pyridOXINE (VITAMIN B-6) 50 MG tablet Take 1 tablet (50 mg total) by mouth daily. 06/28/20   Monica Becton, MD  triamcinolone cream (KENALOG) 0.1 % Apply 1 application topically 2 (two) times daily. Patient taking differently: Apply 1 Application topically as needed. 08/05/22   Charlton Amor, DO     Family History  Problem Relation Age of Onset   Stroke Mother 64   Depression Mother    Anxiety disorder Mother    Bipolar disorder Mother    Obesity Mother    Colon cancer Father 71   Heart attack Father 58   Prostate cancer Father        dx 72's   Bladder Cancer Father 27   Hypertension Father    Hyperlipidemia Father    Heart disease Father    Obesity Father    Hyperlipidemia Sister    Asthma Brother    Hyperlipidemia Brother    Heart attack Maternal Grandmother 42   Heart disease Maternal  Grandmother    Diabetes Maternal Grandfather    Kidney disease Maternal Grandfather    Cancer Paternal Grandmother 28       GYN cancer ( thinks ovarian, maybe uterine)   Anxiety disorder Daughter    Anxiety disorder Son    Breast cancer Other 47   Colon cancer Other 50   Cancer Other        type unk, age dx unk   Esophageal cancer Neg Hx    Stomach cancer Neg Hx     Social History   Socioeconomic History   Marital status: Married    Spouse name: Maurine Minister   Number of children: 2   Years of education: 16   Highest education level: Bachelor's degree (e.g., BA, AB, BS)  Occupational History    Employer: [employer]    Comment: [employer]   Tobacco Use   Smoking status: Never   Smokeless tobacco: Never  Vaping Use   Vaping Use: Never used  Substance and Sexual Activity   Alcohol use: Not Currently    Comment: <1/week wine or beer occasional   Drug use: No   Sexual activity: Yes    Birth control/protection: Post-menopausal  Other Topics Concern   Not on file  Social History Narrative   Lives with her husband. Still works part time. She  enjoys hiking, walking and gardening.     Social Determinants of Health   Financial Resource Strain: Low Risk  (11/20/2022)   Overall Financial Resource Strain (CARDIA)    Difficulty of Paying Living Expenses: Not hard at all  Food Insecurity: No Food Insecurity (01/04/2023)   Hunger Vital Sign    Worried About Running Out of Food in the Last Year: Never true    Ran Out of Food in the Last Year: Never true  Transportation Needs: No Transportation Needs (01/04/2023)   PRAPARE - Administrator, Civil Service (Medical): No    Lack of Transportation (Non-Medical): No  Physical Activity: Sufficiently Active (01/04/2023)   Exercise Vital Sign    Days of Exercise per Week: 4 days    Minutes of Exercise per Session: 60 min  Stress: No Stress Concern Present (01/04/2023)   [employer] of Occupational Health - Occupational Stress Questionnaire    Feeling of Stress : Only a little  Social Connections: Socially Integrated (01/04/2023)   Social Connection and Isolation Panel [NHANES]    Frequency of Communication with Friends and Family: Twice a week    Frequency of Social Gatherings with Friends and Family: Once a week    Attends Religious Services: More than 4 times per year    Active Member of [employer] or Organizations: Yes    Attends Engineer, structural: More than 4 times per year    Marital Status: Married    ECOG Status: {CHL ONC ECOG WG:[id number]}  Review of Systems: A 12 point ROS discussed and pertinent positives are indicated in the HPI above.  All other systems are negative.  Review of Systems  Vital Signs: There were no vitals taken for this visit.  Advance Care Plan: {Advance Care HQIO:96295}    Physical Exam  Imaging: IR Radiologist Eval & Mgmt  Result Date: 01/27/2023 EXAM: NEW PATIENT OFFICE VISIT CHIEF COMPLAINT: See Epic note. HISTORY OF PRESENT ILLNESS: See Epic note. REVIEW OF SYSTEMS: See Epic note. PHYSICAL EXAMINATION: See Epic note.  ASSESSMENT AND PLAN: See Epic note. Marliss Coots, MD [facility]  Electronically Signed   By: Marliss Coots M.D.   On: 01/27/2023 16:32    Labs:  CBC: Recent Labs    05/21/22 0819 12/10/22 1330  WBC 4.4 5.4  HGB 14.0 14.4  HCT 39.3 39.3  PLT 184 190    COAGS: Recent Labs    12/24/22 1530  INR 1.2*    BMP: Recent Labs    05/21/22 0819 12/10/22 1330  NA 137 138  K 3.5 3.5  CL 103 102  CO2 27 28  GLUCOSE 147* 112*  BUN 13 12  CALCIUM 9.6 9.6  CREATININE 0.80 0.76  GFRNONAA >60 >60    LIVER FUNCTION TESTS: Recent Labs    05/21/22 0819 12/10/22 1330  BILITOT 0.7 0.7  AST 25 39  ALT 41 53*  ALKPHOS 36* 33*  PROT 7.5 7.7  ALBUMIN 4.5 4.4    TUMOR MARKERS: Recent Labs    12/22/22 1426  AFPTM 3.5    Assessment and Plan:  72 y.o. female outpatient. History of OA, breast cancer, IBS, HTN. HLD, GERD, endometrial cancer, diverticulitis. Found to  cirrhosis and gastric varices on surveillance screening. 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 CT Abd pelvis from 4.19.24 reads Left retroperitoneal splenorenal venous shunt with gastric varices.  The Patient was seen for consultation in the Portal Hypertension Clinic on 5.14.24 with IR Attending Dr. Marliss Coots. The Patient's clinical picture was discussed. After frank  evaluation regarding treatment options and possible outcomes the patent decided to proceed with retrograde transvenous obliteration of the gastric varices for bleeding prophylaxis. We could consider transjugular liver biopsy with portal pressure measurements.  *** All labs and medications are within acceptable parameters. Allergies include cipro, PCN. Patient has been NPO since midnight.  Risks and benefits of TIPS, BRTO and/or additional variceal embolization were discussed with the patient and/or the patient's  family including, but not limited to, infection, bleeding, damage to adjacent structures, worsening hepatic and/or cardiac function, worsening and/or the development of altered mental status/encephalopathy, non-target embolization and death.   This interventional procedure involves the use of X-rays and because of the nature of the planned procedure, it is possible that we will have prolonged use of X-ray fluoroscopy.  Potential radiation risks to you include (but are not limited to) the following: - A slightly elevated risk for cancer  several years later in life. This risk is typically less than 0.5% percent. This risk is low in comparison to the normal incidence of human cancer, which is 33% for women and 50% for men according to the American Cancer Society. - Radiation induced injury can include skin redness, resembling a rash, tissue breakdown / ulcers and hair loss (which can be temporary or permanent).   The likelihood of either of these occurring depends on the difficulty of the procedure and whether you are sensitive to radiation due to previous procedures, disease, or genetic conditions.   IF your procedure requires a prolonged use of radiation, you will be notified and given written instructions for further action.  It is your responsibility to monitor the irradiated area for the 2 weeks following the procedure and to notify your physician if you are concerned that you have suffered a radiation induced injury.    All of the patient's questions were answered, patient is agreeable to proceed.  Consent signed and in chart.  ***   Thank you for this interesting consult.  I greatly enjoyed meeting Dawn [name]  and look forward to participating in their care.  A copy of this report was sent to the requesting provider on this date.  Electronically Signed: Alene Mires, NP 02/15/2023, 12:51 PM   I spent a total of {New ZOXW:[id number]} {New Out-Pt:[id number]}  {Established  Out-Pt:[id number]} in face to face in clinical consultation, greater than 50% of which was counseling/coordinating care for ***

## 2023-02-16 ENCOUNTER — Other Ambulatory Visit: Payer: Self-pay

## 2023-02-16 ENCOUNTER — Other Ambulatory Visit (HOSPITAL_COMMUNITY): Payer: Self-pay | Admitting: Interventional Radiology

## 2023-02-16 ENCOUNTER — Other Ambulatory Visit: Payer: Self-pay | Admitting: Radiology

## 2023-02-16 ENCOUNTER — Ambulatory Visit (HOSPITAL_COMMUNITY)
Admission: RE | Admit: 2023-02-16 | Discharge: 2023-02-16 | Disposition: A | Discharge status: Home / Self Care | Payer: PPO | Source: Ambulatory Visit | Attending: Interventional Radiology | Admitting: Interventional Radiology

## 2023-02-16 ENCOUNTER — Other Ambulatory Visit (HOSPITAL_COMMUNITY): Payer: Self-pay

## 2023-02-16 DIAGNOSIS — K766 Portal hypertension: Secondary | ICD-10-CM

## 2023-02-16 DIAGNOSIS — E785 Hyperlipidemia, unspecified: Secondary | ICD-10-CM | POA: Insufficient documentation

## 2023-02-16 DIAGNOSIS — I864 Gastric varices: Secondary | ICD-10-CM | POA: Diagnosis not present

## 2023-02-16 DIAGNOSIS — K219 Gastro-esophageal reflux disease without esophagitis: Secondary | ICD-10-CM | POA: Insufficient documentation

## 2023-02-16 DIAGNOSIS — Z853 Personal history of malignant neoplasm of breast: Secondary | ICD-10-CM | POA: Insufficient documentation

## 2023-02-16 DIAGNOSIS — Z8542 Personal history of malignant neoplasm of other parts of uterus: Secondary | ICD-10-CM | POA: Insufficient documentation

## 2023-02-16 DIAGNOSIS — K7581 Nonalcoholic steatohepatitis (NASH): Secondary | ICD-10-CM | POA: Diagnosis not present

## 2023-02-16 DIAGNOSIS — I10 Essential (primary) hypertension: Secondary | ICD-10-CM | POA: Insufficient documentation

## 2023-02-16 HISTORY — PX: IR US GUIDE VASC ACCESS RIGHT: IMG2390

## 2023-02-16 HISTORY — PX: IR VENOGRAM RENAL UNI LEFT: IMG680

## 2023-02-16 HISTORY — PX: IR ANGIOGRAM SELECTIVE EACH ADDITIONAL VESSEL: IMG667

## 2023-02-16 HISTORY — PX: IR TRANSCATHETER BX: IMG713

## 2023-02-16 HISTORY — PX: IR EMBO ART  VEN HEMORR LYMPH EXTRAV  INC GUIDE ROADMAPPING: IMG5450

## 2023-02-16 LAB — COMPREHENSIVE METABOLIC PANEL
ALT: 35 U/L (ref 0–44)
AST: 30 U/L (ref 15–41)
Albumin: 4.2 g/dL (ref 3.5–5.0)
Alkaline Phosphatase: 38 U/L (ref 38–126)
Anion gap: 9 (ref 5–15)
BUN: 10 mg/dL (ref 8–23)
CO2: 27 mmol/L (ref 22–32)
Calcium: 9.3 mg/dL (ref 8.9–10.3)
Chloride: 101 mmol/L (ref 98–111)
Creatinine, Ser: 0.78 mg/dL (ref 0.44–1.00)
GFR, Estimated: >60 mL/min (ref >60)
Glucose, Bld: 131 mg/dL — ABNORMAL HIGH (ref 70–99)
Potassium: 3.6 mmol/L (ref 3.5–5.1)
Sodium: 137 mmol/L (ref 135–145)
Total Bilirubin: 0.9 mg/dL (ref 0.3–1.2)
Total Protein: 7.7 g/dL (ref 6.5–8.1)

## 2023-02-16 LAB — CBC WITH DIFFERENTIAL/PLATELET
Abs Immature Granulocytes: 0.01 10*3/uL (ref 0.00–0.07)
Basophils Absolute: 0 10*3/uL (ref 0.0–0.1)
Basophils Relative: 1 %
Eosinophils Absolute: 0.1 10*3/uL (ref 0.0–0.5)
Eosinophils Relative: 2 %
HCT: 42.5 % (ref 36.0–46.0)
Hemoglobin: 14.9 g/dL (ref 12.0–15.0)
Immature Granulocytes: 0 %
Lymphocytes Relative: 36 %
Lymphs Abs: 1.7 10*3/uL (ref 0.7–4.0)
MCH: 33.6 pg (ref 26.0–34.0)
MCHC: 35.1 g/dL (ref 30.0–36.0)
MCV: 95.7 fL (ref 80.0–100.0)
Monocytes Absolute: 0.5 10*3/uL (ref 0.1–1.0)
Monocytes Relative: 11 %
Neutro Abs: 2.4 10*3/uL (ref 1.7–7.7)
Neutrophils Relative %: 50 %
Platelets: 184 10*3/uL (ref 150–400)
RBC: 4.44 MIL/uL (ref 3.87–5.11)
RDW: 12.3 % (ref 11.5–15.5)
WBC: 4.8 10*3/uL (ref 4.0–10.5)
nRBC: 0 % (ref 0.0–0.2)

## 2023-02-16 LAB — PROTIME-INR
INR: 1 (ref 0.8–1.2)
Prothrombin Time: 13.5 seconds (ref 11.4–15.2)

## 2023-02-16 LAB — TYPE AND SCREEN
ABO/RH(D): O POS
Antibody Screen: NEGATIVE

## 2023-02-16 MED ORDER — FENTANYL CITRATE (PF) 100 MCG/2ML IJ SOLN
INTRAMUSCULAR | Status: AC | PRN
  Administered 2023-02-16: 25 ug via INTRAVENOUS
  Administered 2023-02-16: 50 ug via INTRAVENOUS
  Administered 2023-02-16 (×2): 25 ug via INTRAVENOUS
  Administered 2023-02-16: 50 ug via INTRAVENOUS
  Administered 2023-02-16: 25 ug via INTRAVENOUS

## 2023-02-16 MED ORDER — FENTANYL CITRATE (PF) 100 MCG/2ML IJ SOLN
INTRAMUSCULAR | Status: AC
  Filled 2023-02-16: qty 2

## 2023-02-16 MED ORDER — MIDAZOLAM HCL 2 MG/2ML IJ SOLN
INTRAMUSCULAR | Status: AC
  Filled 2023-02-16: qty 2

## 2023-02-16 MED ORDER — IOHEXOL 300 MG/ML  SOLN: 150.0000 mL | Freq: Once | INTRAMUSCULAR | Status: DC | PRN

## 2023-02-16 MED ORDER — ONDANSETRON HCL 4 MG/2ML IJ SOLN: 4.0000 mg | Freq: Four times a day (QID) | INTRAMUSCULAR | Status: DC | PRN

## 2023-02-16 MED ORDER — IOHEXOL 300 MG/ML  SOLN: 100.0000 mL | Freq: Once | INTRAMUSCULAR | Status: DC | PRN

## 2023-02-16 MED ORDER — MIDAZOLAM HCL 2 MG/2ML IJ SOLN
INTRAMUSCULAR | Status: AC | PRN
  Administered 2023-02-16 (×6): 1 mg via INTRAVENOUS

## 2023-02-16 MED ORDER — OXYCODONE HCL 5 MG PO TABS: 5.0000 mg | ORAL_TABLET | ORAL | Status: DC | PRN

## 2023-02-16 MED ORDER — SODIUM CHLORIDE 0.9 % IV SOLN: INTRAVENOUS | Status: DC

## 2023-02-16 MED ORDER — OXYCODONE-ACETAMINOPHEN 5-325 MG PO TABS
1.0000 | ORAL_TABLET | Freq: Four times a day (QID) | ORAL | 0 refills | Status: AC | PRN
  Filled 2023-02-16: qty 12, 3d supply, fill #0

## 2023-02-16 MED ORDER — GELATIN ABSORBABLE 12-7 MM EX MISC
CUTANEOUS | Status: AC
  Filled 2023-02-16: qty 3

## 2023-02-16 MED ORDER — OXYCODONE HCL 5 MG PO TABS: 10.0000 mg | ORAL_TABLET | ORAL | Status: DC | PRN

## 2023-02-16 MED ORDER — LIDOCAINE-EPINEPHRINE 1 %-1:100000 IJ SOLN
INTRAMUSCULAR | Status: AC
  Filled 2023-02-16: qty 1

## 2023-02-16 NOTE — Procedures (Signed)
Interventional Radiology Procedure Note  Procedure:  1) Transjugular liver biopsy with pressure measurements 2) Retrograde transvenous obliteration of gastric varices  Findings: Please refer to procedural dictation for full description.  18 ga transjugular liver bx x3, samples placed in formalin.  Mean pressure measurements (mmHg) as follows: RA = 8 Free hepatic vein = 17 Wedged portal vein = 21  Technically successful RTO of gastric varix with coil embolization of superior polar splenic vein branch and coil embolization of splenogastrorenal shunt outflow.    Complications: None immediate  Estimated Blood Loss: < 5 mL  Recommendations: 3 hour bedrest, no head of bed restrictions Pain control PRN Advance diet as tolerated IR will arrange for 1 month follow up with CTA BRTO protocol   Marliss Coots, MD Pager: [phone] Clinic: [phone]

## 2023-02-16 NOTE — Progress Notes (Signed)
Patient and husband was given discharge instructions. Both verbalized understanding. 

## 2023-02-17 LAB — SURGICAL PATHOLOGY

## 2023-02-18 ENCOUNTER — Ambulatory Visit: Payer: PPO | Admitting: Physician Assistant

## 2023-02-24 ENCOUNTER — Other Ambulatory Visit: Payer: Self-pay | Admitting: Interventional Radiology

## 2023-02-24 ENCOUNTER — Telehealth: Payer: Self-pay

## 2023-02-24 DIAGNOSIS — K766 Portal hypertension: Secondary | ICD-10-CM

## 2023-02-24 DIAGNOSIS — I864 Gastric varices: Secondary | ICD-10-CM

## 2023-02-24 NOTE — Telephone Encounter (Signed)
Called patient, unable to reach or leave voice message. If pain is not improving, will obtain CT abdomen to further evaluate.  Keep the appointment that is scheduled in July.  Agree with stopping nadolol given she does not have cirrhosis

## 2023-02-24 NOTE — Telephone Encounter (Signed)
Patient has questions in regards to her biopsy. She is noticing if she takes a deep breath, she feels a "catch" in her side where the incision was made for the liver biopsy on 02/16/23. The discomfort is not worsening. The sensation is in her side, not her shoulder or back. No drainage from the incision site. Dr Elby Showers stopped her Nadolol. The patient was complaining of fatigue.  When do you want her to follow up with you? She has appointments scheduled in July and in October.

## 2023-02-25 NOTE — Telephone Encounter (Signed)
Good morning ,patient is returning your call. 

## 2023-02-25 NOTE — Telephone Encounter (Signed)
Spoke with patient. She said she is feeling better. She did not mention any conversations with IR. She went for a massage this morning. She feels this helped her.  She will keep her appointment in July.

## 2023-02-25 NOTE — Telephone Encounter (Signed)
Call to patient. No answer. Left message of my call and asked she call me back.

## 2023-02-25 NOTE — Telephone Encounter (Signed)
Ok, thanks.

## 2023-03-03 ENCOUNTER — Other Ambulatory Visit: Payer: Self-pay | Admitting: Interventional Radiology

## 2023-03-03 DIAGNOSIS — K766 Portal hypertension: Secondary | ICD-10-CM

## 2023-03-03 DIAGNOSIS — I864 Gastric varices: Secondary | ICD-10-CM

## 2023-03-04 ENCOUNTER — Other Ambulatory Visit: Payer: Self-pay | Admitting: Interventional Radiology

## 2023-03-04 DIAGNOSIS — I864 Gastric varices: Secondary | ICD-10-CM

## 2023-03-04 DIAGNOSIS — K766 Portal hypertension: Secondary | ICD-10-CM

## 2023-03-09 ENCOUNTER — Ambulatory Visit: Payer: PPO | Admitting: Gastroenterology

## 2023-03-13 ENCOUNTER — Ambulatory Visit
Admission: RE | Admit: 2023-03-13 | Discharge: 2023-03-13 | Disposition: A | Discharge status: Home / Self Care | Payer: PPO | Source: Ambulatory Visit | Attending: Interventional Radiology | Admitting: Interventional Radiology

## 2023-03-13 ENCOUNTER — Other Ambulatory Visit: Payer: PPO

## 2023-03-13 DIAGNOSIS — I864 Gastric varices: Secondary | ICD-10-CM | POA: Diagnosis not present

## 2023-03-13 DIAGNOSIS — I700 Atherosclerosis of aorta: Secondary | ICD-10-CM | POA: Diagnosis not present

## 2023-03-13 DIAGNOSIS — Z853 Personal history of malignant neoplasm of breast: Secondary | ICD-10-CM | POA: Diagnosis not present

## 2023-03-13 DIAGNOSIS — K766 Portal hypertension: Secondary | ICD-10-CM

## 2023-03-13 DIAGNOSIS — Z8544 Personal history of malignant neoplasm of other female genital organs: Secondary | ICD-10-CM | POA: Diagnosis not present

## 2023-03-13 MED ORDER — IOPAMIDOL (ISOVUE-370) INJECTION 76%
100.0000 mL | Freq: Once | INTRAVENOUS | Status: AC | PRN
  Administered 2023-03-13: 100 mL via INTRAVENOUS

## 2023-03-16 ENCOUNTER — Other Ambulatory Visit (HOSPITAL_COMMUNITY): Payer: Self-pay

## 2023-03-16 ENCOUNTER — Other Ambulatory Visit: Payer: Self-pay

## 2023-03-18 ENCOUNTER — Ambulatory Visit
Admission: RE | Admit: 2023-03-18 | Discharge: 2023-03-18 | Disposition: A | Discharge status: Home / Self Care | Payer: PPO | Source: Ambulatory Visit | Attending: Interventional Radiology | Admitting: Interventional Radiology

## 2023-03-18 DIAGNOSIS — K766 Portal hypertension: Secondary | ICD-10-CM | POA: Diagnosis not present

## 2023-03-18 DIAGNOSIS — I864 Gastric varices: Secondary | ICD-10-CM

## 2023-03-18 HISTORY — PX: IR RADIOLOGIST EVAL & MGMT: IMG5224

## 2023-03-18 NOTE — Progress Notes (Signed)
Referring Physician(s): Marsa Aris, MD  Reason for follow up:  Virtual telephone follow up for gastric varix  History of present illness: Initial HPI from 01/27/23:  Dawn [name] is a 72 y.o. female with a medical history significant for breast and endometrial cancer (2018; in remission), HTN, erosive esophagitis and diverticulosis.  Cancer surveillance imaging March 2024 was negative for evidence of metastatic disease but did identify cirrhosis and gastric varices. She was referred to GI and she subsequently underwent EGD and RUQ ultrasound with elastography evaluation.  The EGD identified varices and portal hypertensive gastropathy. Ultrasound showed fatty liver.  CTA imaging identified a left retroperitoneal spleno-gastro-renal venous shunt with large (up to 2.0 cm) gastric varices. The patient was referred to Interventional Radiology to discuss possible treatment and management options. She presents today via tele-health visit.      She denies history of hematemesis or blood in her stool.  She has never had ascites.  Cirrhosis workup remains unclear, with below threshold measurements on elastography, but some mild morphologic changes on cross sectional imaging.  She denies abdominal pain, dysphagia, change in bowel habits.  With her oncologic history, she was on several different chemotherapy agents.  She has started naldol for bleeding prophylaxis, and states that this has made her feel very fatigued.   Dawn [name] is now status post transjugular liver biopsy and coil assisted retrograde transvenous obliteration of gastric varix on 02/16/23 which went as planned without complication.  Naldol was stopped after the procedure.  Follow up CTA was performed on 03/13/23, results below.  Liver biopsy was significant for mild steatohepatitis, no evidence of definite cirrhosis. She is doing very well since the procedure.  Right internal jugular access site has healed well.  She has more energy after  stopping naldol.  No evidence of GI bleed since the procedure, no abdominal pain.  She has follow up with Dr. Lavon Paganini coming up soon.  Past Medical History:  Diagnosis Date   Allergy 2018   Anemia    as a child.   Arthritis    Atypical mole 06/04/2018   moderate top of right foot   Atypical mole 06/04/2018   left elbow mild   Atypical mole 06/04/2018   left upper back mild   Bilateral cataracts    Bilateral leg cramps    Cancer (HCC)    skin   Dyspnea    Endometrial cancer (HCC)    Family history of bladder cancer    Family history of colon cancer in father    Fatty liver    Fuchs' corneal dystrophy    GERD (gastroesophageal reflux disease)    Hearing loss    Right side 30%   Heart murmur 1960   Heartburn    History of hiatal hernia    small size   History of radiation therapy 08/21/17, 08/28/17,09/01/17, 09/11/17, 09/17/17   Vaginal cuff brachytherapy.    History of radiation therapy 11/11/17- 12/08/17   Right Breast treated to 40.05 Gy with 15 fx of 2.67 Gy and a boost of 10 Gy with 5 fx.    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   Uterine cancer (HCC) 04/2017   endometrial cancer   Varices, gastric    Vertigo     Past Surgical History:  Procedure Laterality Date   ABDOMINAL HYSTERECTOMY  2018   BREAST BIOPSY Right 04/27/2017   BREAST LUMPECTOMY WITH RADIOACTIVE SEED AND SENTINEL LYMPH NODE BIOPSY Right 05/25/2017   Procedure: RIGHT BREAST LUMPECTOMY WITH RADIOACTIVE SEED AND  RIGHT AXILLARY SENTINEL LYMPH NODE BIOPSY;  Surgeon: Glenna Fellows, MD;  Location: [facility];  Service: General;  Laterality: Right;   CATARACT EXTRACTION, BILATERAL     COLONOSCOPY     EYE SURGERY  2018   HYSTEROSCOPY WITH D & C N/A 04/29/2017   Procedure: DILATATION AND CURETTAGE /HYSTEROSCOPY;  Surgeon: Mitchel Honour, DO;  Location: [facility];  Service: Gynecology;  Laterality: N/A;   IR ANGIOGRAM SELECTIVE EACH ADDITIONAL VESSEL  02/16/2023   IR EMBO ART  VEN HEMORR LYMPH EXTRAV  INC GUIDE ROADMAPPING  02/16/2023   IR RADIOLOGIST EVAL & MGMT  01/27/2023   IR TRANSCATHETER BX  02/16/2023   IR US GUIDE VASC ACCESS RIGHT  02/16/2023   IR VENOGRAM RENAL UNI LEFT  02/16/2023   PORT-A-CATH REMOVAL Left 04/16/2021   Procedure: REMOVAL PORT-A-CATH;  Surgeon: Luretha Murphy, MD;  Location: [facility];  Service: General;  Laterality: Left;   PORTACATH PLACEMENT Left 05/25/2017   Procedure: INSERTION PORT-A-CATH;  Surgeon: Glenna Fellows, MD;  Location: [facility];  Service: General;  Laterality: Left;   ROBOTIC ASSISTED TOTAL HYSTERECTOMY WITH BILATERAL SALPINGO OOPHERECTOMY N/A 06/09/2017   Procedure: XI ROBOTIC ASSISTED TOTAL LAPOROSCOPIC HYSTERECTOMY WITH BILATERAL SALPINGO OOPHORECTOMY;  Surgeon: Adolphus Birchwood, MD;  Location: [facility];  Service: Gynecology;  Laterality: N/A;   SENTINEL NODE BIOPSY N/A 06/09/2017   Procedure: SENTINEL NODE BIOPSY;  Surgeon: Adolphus Birchwood, MD;  Location: [facility];  Service: Gynecology;  Laterality: N/A;    Allergies: Ciprofloxacin, Paclitaxel, Crestor [rosuvastatin calcium], Fosamax [alendronate sodium], Livalo [pitavastatin], and Penicillins  Medications: Prior to Admission medications   Medication Sig Start Date End Date Taking? Authorizing Provider  acetaminophen (TYLENOL) 500 MG tablet Take 500 mg by mouth every 6 (six) hours as needed for moderate pain.    [provider]  Alpha-Lipoic Acid 600 MG CAPS Take 1 capsule (600 mg total) by mouth daily. 06/28/20   Monica Becton, MD  anastrozole (ARIMIDEX) 1 MG  tablet Take 1 tablet (1 mg total) by mouth daily. 11/17/22   Malachy Mood, MD  Ascorbic Acid (VITAMIN C) 1000 MG tablet Take 1,000 mg by mouth daily.    [provider]  atorvastatin (LIPITOR) 20 MG tablet Take 1 tablet (20 mg total) by mouth daily. 04/09/22   Agapito Games, MD  b complex vitamins capsule Take 1 capsule by mouth daily.    [provider]  Cholecalciferol (VITAMIN D3) 75 MCG (3000 UT) TABS Take 1 capsule by mouth daily.    [provider]  fluticasone (FLONASE) 50 MCG/ACT nasal spray Place 2 sprays into both nostrils daily. 12/18/22   Agapito Games, MD  levothyroxine (SYNTHROID) 50 MCG tablet TAKE 1 TABLET BY MOUTH  DAILY 01/30/23   Agapito Games, MD  Multiple Vitamin (MULTIVITAMIN) capsule Take 1 capsule by mouth daily.    [provider]  nadolol (CORGARD) 20 MG tablet Take 1 tablet (20 mg total) by mouth daily. 12/24/22   Nandigam,  Eleonore Chiquito, MD  olmesartan-hydrochlorothiazide (BENICAR HCT) 20-12.5 MG tablet Take 1 tablet by mouth daily. 01/05/23   Agapito Games, MD  pantoprazole (PROTONIX) 20 MG tablet Take 1 tablet (20 mg total) by mouth daily. 02/11/23   Napoleon Form, MD  pyridOXINE (VITAMIN B-6) 50 MG tablet Take 1 tablet (50 mg total) by mouth daily. 06/28/20   Monica Becton, MD  triamcinolone cream (KENALOG) 0.1 % Apply 1 application topically 2 (two) times daily. Patient taking differently: Apply 1 Application topically as needed. 08/05/22   Charlton Amor, DO     Family History  Problem Relation Age of Onset   Stroke Mother 37   Depression Mother    Anxiety disorder Mother    Bipolar disorder Mother    Obesity Mother    Colon cancer Father 18   Heart attack Father 57   Prostate cancer Father        dx 73's   Bladder Cancer Father 3   Hypertension Father    Hyperlipidemia Father    Heart disease Father    Obesity Father    Hyperlipidemia Sister    Asthma Brother    Hyperlipidemia  Brother    Heart attack Maternal Grandmother 57   Heart disease Maternal Grandmother    Diabetes Maternal Grandfather    Kidney disease Maternal Grandfather    Cancer Paternal Grandmother 88       GYN cancer ( thinks ovarian, maybe uterine)   Anxiety disorder Daughter    Anxiety disorder Son    Breast cancer Other 75   Colon cancer Other 17   Cancer Other        type unk, age dx unk   Esophageal cancer Neg Hx    Stomach cancer Neg Hx     Social History   Socioeconomic History   Marital status: Married    Spouse name: Maurine Minister   Number of children: 2   Years of education: 16   Highest education level: Bachelor's degree (e.g., BA, AB, BS)  Occupational History    Employer: [employer]    Comment: [employer]   Tobacco Use   Smoking status: Never   Smokeless tobacco: Never  Vaping Use   Vaping Use: Never used  Substance and Sexual Activity   Alcohol use: Not Currently    Comment: <1/week wine or beer occasional   Drug use: No   Sexual activity: Yes    Birth control/protection: Post-menopausal  Other Topics Concern   Not on file  Social History Narrative   Lives with her husband. Still works part time. She enjoys hiking, walking and gardening.     Social Determinants of Health   Financial Resource Strain: Low Risk  (11/20/2022)   Overall Financial Resource Strain (CARDIA)    Difficulty of Paying Living Expenses: Not hard at all  Food Insecurity: No Food Insecurity (01/04/2023)   Hunger Vital Sign    Worried About Running Out of Food in the Last Year: Never true    Ran Out of Food in the Last Year: Never true  Transportation Needs: No Transportation Needs (01/04/2023)   PRAPARE - Administrator, Civil Service (Medical): No    Lack of Transportation (Non-Medical): No  Physical Activity: Sufficiently Active (01/04/2023)   Exercise Vital Sign    Days of Exercise per Week: 4 days    Minutes of Exercise per Session: 60 min  Stress: No  Stress Concern Present (01/04/2023)  [employer] of Occupational Health - Occupational Stress Questionnaire    Feeling of Stress : Only a little  Social Connections: Socially Integrated (01/04/2023)   Social Connection and Isolation Panel [NHANES]    Frequency of Communication with Friends and Family: Twice a week    Frequency of Social Gatherings with Friends and Family: Once a week    Attends Religious Services: More than 4 times per year    Active Member of [employer] or Organizations: Yes    Attends Engineer, structural: More than 4 times per year    Marital Status: Married     Vital Signs: There were no vitals taken for this visit.  No physical examination was performed in lieu of virtual telephone clinic visit.   Imaging: CTA AP 01/12/23  Minimal/no signs of cirrhosis.  Patent portal system.  Large gastric varices in the fundus with spleno-gastro-renal portosystemic shunt.  No ectopic varices or ascites.   [name] Elastography (12/29/22) IMPRESSION: ULTRASOUND RUQ: Fatty liver infiltration. ULTRASOUND HEPATIC ELASTOGRAPHY: Median kPa: 4.1 Diagnostic category: < or = 5 kPa: high probability of being normal      EGD 12/24/22    IR embolization 02/16/23 Mean pressure measurements (mmHg) as follows: RA = 8 Free hepatic vein = 17 Wedged portal vein = 21 Large gastric varix with draining splenic vein.   Coil + sclerosant embolziation  CTA 03/13/23  Resolution of previously visualized gastric varix status post retrograde coil assisted transvenous obliteration.    Labs:  CBC: Recent Labs    05/21/22 0819 12/10/22 1330 02/16/23 0844  WBC 4.4 5.4 4.8  HGB 14.0 14.4 14.9  HCT 39.3 39.3 42.5  PLT 184 190 184    COAGS: Recent Labs    12/24/22 1530 02/16/23 0845  INR 1.2* 1.0    BMP: Recent Labs    05/21/22 0819 12/10/22 1330 02/16/23 0844  NA 137 138 137  K 3.5 3.5 3.6  CL 103 102 101  CO2 27 28 27   GLUCOSE 147* 112* 131*  BUN 13 12 10    CALCIUM 9.6 9.6 9.3  CREATININE 0.80 0.76 0.78  GFRNONAA >60 >60 >60    LIVER FUNCTION TESTS: Recent Labs    05/21/22 0819 12/10/22 1330 02/16/23 0844  BILITOT 0.7 0.7 0.9  AST 25 39 30  ALT 41 53* 35  ALKPHOS 36* 33* 38  PROT 7.5 7.7 7.7  ALBUMIN 4.5 4.4 4.2   Surgical Pathology - Liver Bx 02/16/23   Assessment and Plan: 72 year old female with history of breast and endometrial cancer, treated, with recent diagnosis of gastric varices and possible cirrhosis noted on recent oncologic surveillance imaging. Further workup with CTA abdomen/pelvis, endoscopy, and [name] elastography remain indeterminate as far as cirrhosis diagnosis, however large gastric varices are present. Given spleno-gastro-renal shunt, she underwent transjugular liver biopsy with pressure measurements in addition to coil assisted retrograde transvenous obliteration of gastric varix on 02/16/23 from internal jugular vein approach.  Portal pressure measurements were significant for portal hypertension (mean portosystemic gradient of 13 mmHg) however liver biopsy did not demonstrate cirrhosis.  1 month follow up CTA demonstrates complete obliteration of the previously visualized gastric varix.  -continue ongoing GI follow up with Dr. Lavon Paganini -given indeterminate etiology of portal hypertension in the absence of definite cirrhosis, plan for annual CTA abdomen pelvis (BRTO protocol) to screen for future manifestations of portal hypertension - our office with arrange for this and IR clinic follow up shortly thereafter   Marliss Coots, MD Pager: [phone]  Clinic: [phone]    I spent a total of 25 Minutes in virtual telephone clinical consultation, greater than 50% of which was counseling/coordinating care for portal hypertension.

## 2023-03-23 ENCOUNTER — Ambulatory Visit: Payer: Self-pay | Admitting: Licensed Clinical Social Worker

## 2023-03-23 NOTE — Patient Outreach (Signed)
  Care Coordination   Initial Visit Note   03/23/2023 Name: Dawn [name] MRN: [id number] DOB: 1951/07/27  Dawn [name] is a 72 y.o. year old female who sees Dawn [name], Dawn Ehlers, MD for primary care. I spoke with  Dawn [name] by phone today.  What matters to the patients health and wellness today?  Patient feels that she is doing well at present. Does not feel that she needs program support at this time    Goals Addressed             This Visit's Progress    Patient Stated she thought she was doing well at present. She did not think she needed program support at this time       Interventions Spoke with client about client needs Discussed program support with RN, Pharmacist and LCSW Discussed client support with PCP, Dr. Nani [name].. Encouraged client to talk with PCP about Care Coordination program support . Gave client LCSW name and phone number. Encouraged client to call LCSW at [phone]. as needed to learn more about program and program support        SDOH assessments and interventions completed:  Yes  SDOH Interventions Today    Flowsheet Row Most Recent Value  SDOH Interventions   Stress Interventions Provide Counseling  [client may have occasional stress in managing medical needs]        Care Coordination Interventions:  Yes, provided    Interventions Today    Flowsheet Row Most Recent Value  Chronic Disease   Chronic disease during today's visit Other  [spoke with client about client needs]  General Interventions   General Interventions Discussed/Reviewed General Interventions Discussed, [facility]  [discussed program support]  Education Interventions   Education Provided Provided Education  Provided Engineer, petroleum On [employer]  Mental Health Interventions   Mental Health Discussed/Reviewed Coping Strategies  [client did not mention any mood issues. She said she thought she was doing well at present]  Nutrition  Interventions   Nutrition Discussed/Reviewed Nutrition Discussed       Follow up plan: Client has LCSW name and phone number. Client to call LCSW as needed to further discuss program services    Encounter Outcome:  Pt. Visit Completed   Dawn [name].Dawn [name] MSW, LCSW Licensed Visual merchandiser [facility] Care Management [phone]

## 2023-03-23 NOTE — Patient Instructions (Signed)
Visit Information  Thank you for taking time to visit with me today. Please don't hesitate to contact me if I can be of assistance to you.   Following are the goals we discussed today:   Goals Addressed             This Visit's Progress    Patient Stated she thought she was doing well at present. She did not think she needed program support at this time       Interventions Spoke with client about client needs Discussed program support with RN, Pharmacist and LCSW Discussed client support with PCP, Dr. Nani Gasser.. Encouraged client to talk with PCP about Care Coordination program support . Gave client LCSW name and phone number. Encouraged client to call LCSW at [phone]. as needed to learn more about program and program support        Client has LCSW name and phone number. Client to call LCSW as needed to learn more about program support  Please call the care guide team at [phone] if you need to cancel or reschedule your appointment.   If you are experiencing a Mental Health or Behavioral Health Crisis or need someone to talk to, please go to [facility] [address], [place] [phone])   The patient verbalized understanding of instructions, educational materials, and care plan provided today and DECLINED offer to receive copy of patient instructions, educational materials, and care plan.   The patient has been provided with contact information for the care management team and has been advised to call with any health related questions or concerns.   Kelton Pillar.Dessiree Sze MSW, LCSW Licensed Visual merchandiser [facility] Care Management [phone]

## 2023-03-29 ENCOUNTER — Other Ambulatory Visit: Payer: Self-pay | Admitting: Family Medicine

## 2023-03-29 DIAGNOSIS — E7849 Other hyperlipidemia: Secondary | ICD-10-CM

## 2023-03-30 ENCOUNTER — Other Ambulatory Visit (HOSPITAL_COMMUNITY): Payer: Self-pay

## 2023-03-30 ENCOUNTER — Other Ambulatory Visit: Payer: Self-pay

## 2023-03-30 MED ORDER — ATORVASTATIN CALCIUM 20 MG PO TABS
20.0000 mg | ORAL_TABLET | Freq: Every day | ORAL | 1 refills | Status: DC
Start: 2023-03-30 — End: 2023-09-28
  Filled 2023-03-30: qty 90, 90d supply, fill #0
  Filled 2023-07-06: qty 90, 90d supply, fill #1

## 2023-03-31 NOTE — Progress Notes (Signed)
Dawn [name]    [id number]    1951/07/02  Primary Care Physician:Metheney, Barbarann Ehlers, MD  Referring Physician: Agapito Games, MD [address] [place],  [place] [address]   Chief complaint:  Chief Complaint  Patient presents with   Fatty Liver    No complaints today   HPI: Patient has history of breast and endometrial cancer diagnosed in 2018. She has undergone a Lumpectomy in 2018 and has been in remission since 2021. Patient has history of diverticulosis. Family history of colon cancer.   Dawn [name] is a very pleasant 72 y.o. 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  Today, she reports feeling well overall with no GI complains. She reports some lower back pain.  She also reports increasing her exercise levels by going to gym. We also reviewed her latest CT scan and discussed the results.   Patient denies any diarrhea, constipation, nausea, blood in stool, black stool, vomiting, abdominal pain, bloating, unintentional weight loss, reflux, dysphagia.  Liver biopsy 02/16/2023 A. LIVER, RIGHT LOBE, NEEDLE CORE BIOPSY:  -  Mild active steatohepatitis with thin fibrous septa   CT angio abdomen pelvis 03/13/2023 1. Resolution of previously visualized gastric varix status post retrograde coil assisted transvenous obliteration. 2.  Aortic Atherosclerosis (ICD10-I70.0).  GI Hx:  EGD 12-24-22 - Grade I esophageal varices.  - Z-line regular, 38 cm from the incisors.  - Type 2 gastroesophageal varices (GOV2, esophageal varices which extend along the fundus), without bleeding.  - Portal hypertensive gastropathy.  - Normal examined duodenum.  - No specimens collected.  Cirrhosis on recent CT at the request of Dr. Nani Gasser.  EGD 11-22-19 - Moderate inflammation characterized by erosions, erythema and granularity was found in the gastric antrum. Biopsies were taken  with a cold forceps for histology. - Isolated proximal stomach varices along the greater curvature of the stomach. No signs of recent bleeding. - Mild reflux related esophagitis (LA Grade A). - The examination was otherwise normal. Pathology: Surgical [P], colon, transverse, polyp (2) - TUBULAR ADENOMA. - SESSILE SERRATED POLYP WITHOUT DYSPLASIA. - NO HIGH GRADE DYSPLASIA OR MALIGNANCY.  COLONOSCOPY 11-22-19 - Two 2 to 4 mm polyps in the transverse colon, removed with a cold snare. Resected and retrieved. - Diverticulosis in the left colon. - The examination was otherwise normal on direct and retroflexion views. Pathology: Surgical [P], gastric antrum and gastric body - MILD CHRONIC GASTRITIS. - WARTHIN-STARRY IS NEGATIVE FOR HELICOBACTER PYLORI. - NO INTESTINAL METAPLASIA, DYSPLASIA, OR MALIGNANCY.  COLONOSCOPY 10-26-14 1. Normal colonoscopy  2. There was mild diverticulosis noted in the sigmoid colon  3.long redundant hypotonic colon. Difficult exam. Decreased haustral folds, suspect colonic inertia  COLONOSCOPY 09-18-2009 -mild diverticulosis noted in the sigmoid colon   Current Outpatient Medications:    acetaminophen (TYLENOL) 500 MG tablet, Take 500 mg by mouth every 6 (six) hours as needed for moderate pain., Disp: , Rfl:    Alpha-Lipoic Acid 600 MG CAPS, Take 1 capsule (600 mg total) by mouth daily., Disp: 90 capsule, Rfl: 3   anastrozole (ARIMIDEX) 1 MG tablet, Take 1 tablet (1 mg total) by mouth daily., Disp: 90 tablet, Rfl: 3   Ascorbic Acid (VITAMIN C) 1000 MG tablet, Take 1,000 mg by mouth daily., Disp: , Rfl:    atorvastatin (LIPITOR) 20 MG tablet, Take 1 tablet (20 mg total) by mouth daily., Disp:  90 tablet, Rfl: 1   b complex vitamins capsule, Take 1 capsule by mouth daily., Disp: , Rfl:    Cholecalciferol (VITAMIN D3) 75 MCG (3000 UT) TABS, Take 1 capsule by mouth daily., Disp: , Rfl:    fluticasone (FLONASE) 50 MCG/ACT nasal spray, Place 2 sprays into both nostrils daily.,  Disp: 48 g, Rfl: 3   levothyroxine (SYNTHROID) 50 MCG tablet, TAKE 1 TABLET BY MOUTH  DAILY, Disp: 90 tablet, Rfl: 3   Multiple Vitamin (MULTIVITAMIN) capsule, Take 1 capsule by mouth daily., Disp: , Rfl:    olmesartan-hydrochlorothiazide (BENICAR HCT) 20-12.5 MG tablet, Take 1 tablet by mouth daily., Disp: 90 tablet, Rfl: 3   pantoprazole (PROTONIX) 20 MG tablet, Take 1 tablet (20 mg total) by mouth daily. (Patient taking differently: Take 20 mg by mouth as needed.), Disp: 90 tablet, Rfl: 0   pyridOXINE (VITAMIN B-6) 50 MG tablet, Take 1 tablet (50 mg total) by mouth daily., Disp: 90 tablet, Rfl: 3   triamcinolone cream (KENALOG) 0.1 %, Apply 1 application topically 2 (two) times daily. (Patient taking differently: Apply 1 Application topically as needed.), Disp: 30 g, Rfl: 1   Allergies as of 04/06/2023 - Review Complete 04/06/2023  Allergen Reaction Noted   Ciprofloxacin Hives 05/25/2017   Paclitaxel Rash 11/19/2017   Crestor [rosuvastatin calcium] Other (See Comments) 09/26/2011   Fosamax [alendronate sodium] Other (See Comments) 11/19/2017   Livalo [pitavastatin] Other (See Comments) 06/08/2020   Penicillins Rash 04/22/2007    Past Medical History:  Diagnosis Date   Allergy 2018   Anemia    as a child.   Arthritis    Atypical mole 06/04/2018   moderate top of right foot   Atypical mole 06/04/2018   left elbow mild   Atypical mole 06/04/2018   left upper back mild   Bilateral cataracts    Bilateral leg cramps    Cancer (HCC)    skin   Dyspnea    Endometrial cancer (HCC)    Family history of bladder cancer    Family history of colon cancer in father    Fatty liver    Fuchs' corneal dystrophy    GERD (gastroesophageal reflux disease)    Hearing loss    Right side 30%   Heart murmur 1960   Heartburn    History of hiatal hernia    small size   History of radiation therapy 08/21/17, 08/28/17,09/01/17, 09/11/17, 09/17/17   Vaginal cuff brachytherapy.    History of  radiation therapy 11/11/17- 12/08/17   Right Breast treated to 40.05 Gy with 15 fx of 2.67 Gy and a boost of 10 Gy with 5 fx.    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   Uterine cancer (HCC) 04/2017   endometrial cancer   Varices, gastric    Vertigo     Past Surgical History:  Procedure Laterality Date   ABDOMINAL HYSTERECTOMY  2018   BREAST BIOPSY Right 04/27/2017   BREAST LUMPECTOMY WITH RADIOACTIVE SEED AND SENTINEL LYMPH NODE BIOPSY Right 05/25/2017   Procedure:  RIGHT BREAST LUMPECTOMY WITH RADIOACTIVE SEED AND  RIGHT AXILLARY SENTINEL LYMPH NODE BIOPSY;  Surgeon: Glenna Fellows, MD;  Location: [facility];  Service: General;  Laterality: Right;   CATARACT EXTRACTION, BILATERAL     COLONOSCOPY     EYE SURGERY  2018   HYSTEROSCOPY WITH D & C N/A 04/29/2017   Procedure: DILATATION AND CURETTAGE /HYSTEROSCOPY;  Surgeon: Mitchel Honour, DO;  Location: [facility];  Service: Gynecology;  Laterality: N/A;   IR ANGIOGRAM SELECTIVE EACH ADDITIONAL VESSEL  02/16/2023   IR EMBO ART  VEN HEMORR LYMPH EXTRAV  INC GUIDE ROADMAPPING  02/16/2023   IR RADIOLOGIST EVAL & MGMT  01/27/2023   IR RADIOLOGIST EVAL & MGMT  03/18/2023   IR TRANSCATHETER BX  02/16/2023   IR US GUIDE VASC ACCESS RIGHT  02/16/2023   IR VENOGRAM RENAL UNI LEFT  02/16/2023   PORT-A-CATH REMOVAL Left 04/16/2021   Procedure: REMOVAL PORT-A-CATH;  Surgeon: Luretha Murphy, MD;  Location: [facility];  Service: General;  Laterality: Left;   PORTACATH PLACEMENT Left 05/25/2017   Procedure: INSERTION PORT-A-CATH;  Surgeon: Glenna Fellows, MD;  Location: [facility];  Service: General;  Laterality: Left;   ROBOTIC ASSISTED TOTAL HYSTERECTOMY WITH BILATERAL SALPINGO OOPHERECTOMY N/A 06/09/2017   Procedure: XI ROBOTIC ASSISTED TOTAL LAPOROSCOPIC HYSTERECTOMY WITH BILATERAL SALPINGO OOPHORECTOMY;  Surgeon: Adolphus Birchwood, MD;  Location: [facility];  Service: Gynecology;  Laterality: N/A;   SENTINEL NODE BIOPSY N/A 06/09/2017   Procedure: SENTINEL NODE BIOPSY;  Surgeon: Adolphus Birchwood, MD;  Location: [facility];  Service: Gynecology;  Laterality: N/A;    Family History  Problem Relation Age of Onset   Stroke Mother 78   Depression Mother    Anxiety disorder Mother    Bipolar disorder Mother    Obesity Mother    Colon cancer Father 25   Heart attack Father 52   Prostate cancer Father        dx 74's   Bladder Cancer Father 42   Hypertension Father    Hyperlipidemia Father    Heart disease Father    Obesity Father    Hyperlipidemia Sister    Asthma Brother    Hyperlipidemia Brother    Heart attack Maternal Grandmother 17   Heart disease Maternal Grandmother    Diabetes Maternal Grandfather    Kidney disease Maternal Grandfather    Cancer Paternal Grandmother 44       GYN cancer ( thinks ovarian, maybe uterine)   Anxiety disorder Daughter    Anxiety disorder Son    Breast cancer Other 57   Colon cancer Other 60   Cancer Other        type unk, age dx unk   Esophageal cancer Neg Hx    Stomach cancer Neg Hx     Social History   Socioeconomic History   Marital status: Married    Spouse name: Maurine Minister   Number of children: 2   Years of education: 16   Highest education level: Bachelor's degree (e.g., BA, AB, BS)  Occupational History    Employer:  [employer]    Comment: [employer]   Tobacco Use   Smoking status: Never   Smokeless tobacco: Never  Vaping Use   Vaping status: Never Used  Substance and Sexual Activity   Alcohol use: Not Currently    Comment: <1/week wine or  beer occasional   Drug use: No   Sexual activity: Yes    Birth  control/protection: Post-menopausal  Other Topics Concern   Not on file  Social History Narrative   Lives with her husband. Still works part time. She enjoys hiking, walking and gardening.     Social Determinants of Health   Financial Resource Strain: Low Risk  (11/20/2022)   Overall Financial Resource Strain (CARDIA)    Difficulty of Paying Living Expenses: Not hard at all  Food Insecurity: No Food Insecurity (01/04/2023)   Hunger Vital Sign    Worried About Running Out of Food in the Last Year: Never true    Ran Out of Food in the Last Year: Never true  Transportation Needs: No Transportation Needs (01/04/2023)   PRAPARE - Administrator, Civil Service (Medical): No    Lack of Transportation (Non-Medical): No  Physical Activity: Sufficiently Active (01/04/2023)   Exercise Vital Sign    Days of Exercise per Week: 4 days    Minutes of Exercise per Session: 60 min  Stress: No Stress Concern Present (03/23/2023)   [employer] of Occupational Health - Occupational Stress Questionnaire    Feeling of Stress : Only a little  Social Connections: Socially Integrated (01/04/2023)   Social Connection and Isolation Panel [NHANES]    Frequency of Communication with Friends and Family: Twice a week    Frequency of Social Gatherings with Friends and Family: Once a week    Attends Religious Services: More than 4 times per year    Active Member of [employer] or Organizations: Yes    Attends Engineer, structural: More than 4 times per year    Marital Status: Married  Catering manager Violence: Not At Risk (11/24/2022)   Humiliation, Afraid, Rape, and Kick questionnaire    Fear of Current or Ex-Partner: No    Emotionally Abused: No    Physically Abused: No    Sexually Abused: No      Review of systems: .Review of Systems  Constitutional:  Negative for unexpected weight change.  HENT:  Negative for trouble  swallowing.   Gastrointestinal:  Negative for abdominal distention, abdominal pain, anal bleeding, blood in stool, constipation, diarrhea, nausea, rectal pain and vomiting.  Musculoskeletal:  Positive for back pain.      Physical Exam: Vitals:   04/06/23 0848  BP: 132/74  Pulse: 76    Body mass index is 33.13 kg/m. General: well-appearing   Eyes: sclera anicteric, no redness ENT: oral mucosa moist without lesions, no cervical or supraclavicular lymphadenopathy CV: RRR, no JVD, no peripheral edema Resp: clear to auscultation bilaterally, normal RR and effort noted GI: soft, no tenderness, with active bowel sounds. No guarding or palpable organomegaly noted. Skin; warm and dry, no rash or jaundice noted Neuro: awake, alert and oriented x 3. Normal gross motor function and fluent speech'  Data Reviewed:  Reviewed labs, radiology imaging, old records and pertinent past GI work up   Assessment and Plan/Recommendations: 72 year old very pleasant female with history of endometrial and breast cancer diagnosed in 2018, in clinical remission s/p treatment, longstanding history of steatohepatitis here for follow-up visit with findings on recent CT concerning for progression to cirrhosis and portal hypertension with gastric varices  S/p BRTO with resolution of gastric varices on follow-up CT angio IR guided liver biopsy showed mild steatohepatitis with no features of advanced fibrosis or cirrhosis  Scheduled for repeat EGD for surveillance of varices s/p BRTO  She did not tolerate beta-blocker, given no history of advanced fibrosis or cirrhosis, no plan to restart it  No  evidence of volume overload  Workup negative for any other etiology of chronic liver disease other than NASH/MASH Discussed dietary modification with limiting sugar intake and simple carbohydrates Continue daily exercise Avoid alcohol  Continue daily vitamin E 400 international units [over-the-counter]  Due for  surveillance colonoscopy in 2026  Return in 3 months or sooner if needed   The patient was provided an opportunity to ask questions and all were answered. The patient agreed with the plan and demonstrated an understanding of the instructions.   I,Safa M Kadhim,acting as a scribe for Marsa Aris, MD.,have documented all relevant documentation on the behalf of Marsa Aris, MD,as directed by  Marsa Aris, MD while in the presence of Marsa Aris, MD.   I, Marsa Aris, MD, have reviewed all documentation for this visit. The documentation on 04/06/23 for the exam, diagnosis, procedures, and orders are all accurate and complete.   Iona Beard , MD  Ladona Mow Hewitt [employer]orts as a scribe for Marsa Aris, MD.,have documented all relevant documentation on the behalf of Marsa Aris, MD,as directed by  Marsa Aris, MD while in the presence of Marsa Aris, MD.   I, Marsa Aris, MD, have reviewed all documentation for this visit. The documentation on 04/06/23 for the exam, diagnosis, procedures, and orders are all accurate and complete.   CC: Agapito Games, *

## 2023-04-06 ENCOUNTER — Encounter: Payer: Self-pay | Admitting: Gastroenterology

## 2023-04-06 ENCOUNTER — Ambulatory Visit: Payer: PPO | Admitting: Gastroenterology

## 2023-04-06 VITALS — BP 132/74 | HR 76 | Ht 63.0 in | Wt 187.0 lb

## 2023-04-06 DIAGNOSIS — K7581 Nonalcoholic steatohepatitis (NASH): Secondary | ICD-10-CM | POA: Diagnosis not present

## 2023-04-06 DIAGNOSIS — I864 Gastric varices: Secondary | ICD-10-CM | POA: Diagnosis not present

## 2023-04-06 DIAGNOSIS — K760 Fatty (change of) liver, not elsewhere classified: Secondary | ICD-10-CM

## 2023-04-06 NOTE — Patient Instructions (Signed)
You have been scheduled for an endoscopy. Please follow written instructions given to you at your visit today.  If you use inhalers (even only as needed), please bring them with you on the day of your procedure.  If you take any of the following medications, they will need to be adjusted prior to your procedure:   DO NOT TAKE 7 DAYS PRIOR TO TEST- Trulicity (dulaglutide) Ozempic, Wegovy (semaglutide) Mounjaro (tirzepatide) Bydureon Bcise (exanatide extended release)  DO NOT TAKE 1 DAY PRIOR TO YOUR TEST Rybelsus (semaglutide) Adlyxin (lixisenatide) Victoza (liraglutide) Byetta (exanatide) ___________________________________________________________________________     OK for light breakfast morning of your procedure per Dr Lavon Paganini as long as its before 6:30 am, such as 1 egg and 1 piece of toast   Due to recent changes in healthcare laws, you may see the results of your imaging and laboratory studies on MyChart before your provider has had a chance to review them.  We understand that in some cases there may be results that are confusing or concerning to you. Not all laboratory results come back in the same time frame and the provider may be waiting for multiple results in order to interpret others.  Please give [name] 48 hours in order for your provider to thoroughly review all the results before contacting the office for clarification of your results.    I appreciate the  opportunity to care for you  Thank You   Marsa Aris , MD

## 2023-04-08 ENCOUNTER — Encounter: Payer: Self-pay | Admitting: Gastroenterology

## 2023-04-17 ENCOUNTER — Other Ambulatory Visit (HOSPITAL_COMMUNITY): Payer: Self-pay

## 2023-04-17 ENCOUNTER — Encounter: Payer: Self-pay | Admitting: Gastroenterology

## 2023-04-17 ENCOUNTER — Ambulatory Visit (AMBULATORY_SURGERY_CENTER): Payer: PPO | Admitting: Gastroenterology

## 2023-04-17 VITALS — BP 127/78 | HR 64 | Temp 98.1°F | Resp 12 | Ht 63.0 in | Wt 187.0 lb

## 2023-04-17 DIAGNOSIS — K760 Fatty (change of) liver, not elsewhere classified: Secondary | ICD-10-CM | POA: Diagnosis not present

## 2023-04-17 DIAGNOSIS — K2210 Ulcer of esophagus without bleeding: Secondary | ICD-10-CM | POA: Diagnosis not present

## 2023-04-17 DIAGNOSIS — K2970 Gastritis, unspecified, without bleeding: Secondary | ICD-10-CM

## 2023-04-17 DIAGNOSIS — K319 Disease of stomach and duodenum, unspecified: Secondary | ICD-10-CM | POA: Diagnosis not present

## 2023-04-17 DIAGNOSIS — I864 Gastric varices: Secondary | ICD-10-CM | POA: Diagnosis not present

## 2023-04-17 DIAGNOSIS — K2100 Gastro-esophageal reflux disease with esophagitis, without bleeding: Secondary | ICD-10-CM | POA: Diagnosis not present

## 2023-04-17 MED ORDER — SUCRALFATE 1 GM/10ML PO SUSP
1.0000 g | Freq: Four times a day (QID) | ORAL | 1 refills | Status: DC
  Filled 2023-04-17: qty 420, 11d supply, fill #0
  Filled 2023-04-27: qty 420, 11d supply, fill #1

## 2023-04-17 MED ORDER — PANTOPRAZOLE SODIUM 40 MG PO TBEC
40.0000 mg | DELAYED_RELEASE_TABLET | Freq: Every day | ORAL | 1 refills | Status: DC
  Filled 2023-04-17: qty 90, 90d supply, fill #0

## 2023-04-17 MED ORDER — SODIUM CHLORIDE 0.9 % IV SOLN
500.0000 mL | Freq: Once | INTRAVENOUS | Status: DC
Start: 2023-04-17 — End: 2023-04-17

## 2023-04-17 NOTE — Progress Notes (Signed)
Pt's states no medical or surgical changes since previsit or office visit. 

## 2023-04-17 NOTE — Patient Instructions (Addendum)
Recommendation:           - Patient has a contact number available for                            emergencies. The signs and symptoms of potential                            delayed complications were discussed with the                            patient. Return to normal activities tomorrow.                            Written discharge instructions were provided to the                            patient.                           - Resume previous diet.                           - Continue present medications.                           - Await pathology results.                           - No ibuprofen, naproxen, or other non-steroidal                            anti-inflammatory drugs.                           - Use Protonix (pantoprazole) 40 mg PO daily for 3                            months.                           - Use sucralfate suspension 1 gram PO QID for 2                            weeks.                           - Return to GI office in 6 months.      Document Information   YOU HAD AN ENDOSCOPIC PROCEDURE TODAY AT THE [facility]:   Refer to the procedure report that was given to you for any specific questions about what was found during the examination.  If the procedure report does not answer your questions, please call your gastroenterologist to clarify.  If you requested that your care partner not be given the details of your procedure findings, then the procedure report has been included in a sealed envelope for you to review at your convenience later.  YOU SHOULD EXPECT: Some feelings of bloating in the  abdomen. Passage of more gas than usual.  Walking can help get rid of the air that was put into your GI tract during the procedure and reduce the bloating. If you had a lower endoscopy (such as a colonoscopy or flexible sigmoidoscopy) you may notice spotting of blood in your stool or on the toilet paper. If you underwent a bowel prep for your procedure, you  may not have a normal bowel movement for a few days.  Please Note:  You might notice some irritation and congestion in your nose or some drainage.  This is from the oxygen used during your procedure.  There is no need for concern and it should clear up in a day or so.  SYMPTOMS TO REPORT IMMEDIATELY: Following upper endoscopy (EGD)  Vomiting of blood or coffee ground material  New chest pain or pain under the shoulder blades  Painful or persistently difficult swallowing  New shortness of breath  Fever of 100F or higher  Black, tarry-looking stools  For urgent or emergent issues, a gastroenterologist can be reached at any hour by calling [phone]. Do not use MyChart messaging for urgent concerns.    DIET:  We do recommend a small meal at first, but then you may proceed to your regular diet.  Drink plenty of fluids but you should avoid alcoholic beverages for 24 hours.  ACTIVITY:  You should plan to take it easy for the rest of today and you should NOT DRIVE or use heavy machinery until tomorrow (because of the sedation medicines used during the test).    FOLLOW UP: Our staff will call the number listed on your records the next business day following your procedure.  We will call around 7:15- 8:00 am to check on you and address any questions or concerns that you may have regarding the information given to you following your procedure. If we do not reach you, we will leave a message.     If any biopsies were taken you will be contacted by phone or by letter within the next 1-3 weeks.  Please call us at [phone] if you have not heard about the biopsies in 3 weeks.    SIGNATURES/CONFIDENTIALITY: You and/or your care partner have signed paperwork which will be entered into your electronic medical record.  These signatures attest to the fact that that the information above on your After Visit Summary has been reviewed and is understood.  Full responsibility of the confidentiality of  this discharge information lies with you and/or your care-partner.

## 2023-04-17 NOTE — Op Note (Signed)
[facility] Patient Name: Dawn [name] Procedure Date: 04/17/2023 2:18 PM MRN: [id number] Endoscopist: Napoleon Form , MD, [id number] Age: 72 Referring MD:  Date of Birth: 04-15-1951 Gender: Female Account #: [id number] Procedure:                Upper GI endoscopy Indications:              Follow-up of esophageal and gastric varices in                            patient with suspected portal hypertension s/p BRTO Medicines:                Monitored Anesthesia Care Procedure:                Pre-Anesthesia Assessment:                           - Prior to the procedure, a History and Physical                            was performed, and patient medications and                            allergies were reviewed. The patient's tolerance of                            previous anesthesia was also reviewed. The risks                            and benefits of the procedure and the sedation                            options and risks were discussed with the patient.                            All questions were answered, and informed consent                            was obtained. Prior Anticoagulants: The patient has                            taken no anticoagulant or antiplatelet agents. ASA                            Grade Assessment: II - A patient with mild systemic                            disease. After reviewing the risks and benefits,                            the patient was deemed in satisfactory condition to                            undergo the procedure.  After obtaining informed consent, the endoscope was                            passed under direct vision. Throughout the                            procedure, the patient's blood pressure, pulse, and                            oxygen saturations were monitored continuously. The                            Olympus Scope [id number] was introduced through the                             mouth, and advanced to the second part of duodenum.                            The upper GI endoscopy was accomplished without                            difficulty. The patient tolerated the procedure                            well. Scope In: Scope Out: Findings:                 Grade I varices were found in the middle third of                            the esophagus and in the lower third of the                            esophagus. They were less than 1 mm in largest                            diameter.                           Two superficial esophageal ulcers with no bleeding                            and no stigmata of recent bleeding were found 36 to                            37 cm from the incisors. The largest lesion was 4                            mm in largest dimension.                           A 3 cm hiatal hernia was present.  Patchy mild inflammation characterized by                            congestion (edema), erosions and erythema was found                            in the entire examined stomach. Biopsies were taken                            with a cold forceps for Helicobacter pylori testing.                           Type 1 isolated gastric varices (IGV1, varices                            located in the fundus) with status post eradication                            were found in the gastric fundus. There were no                            stigmata of recent bleeding. They were less than 5                            mm in largest diameter.                           The examined duodenum was normal. Complications:            No immediate complications. Estimated Blood Loss:     Estimated blood loss was minimal. Impression:               - Grade I esophageal varices.                           - Esophageal ulcers with no bleeding and no                            stigmata of recent bleeding.                           - 3 cm hiatal  hernia.                           - Gastritis. Biopsied.                           - Type 1 isolated gastric varices (IGV1, varices                            located in the fundus), status post previous                            eradication.                           -  Normal examined duodenum. Recommendation:           - Patient has a contact number available for                            emergencies. The signs and symptoms of potential                            delayed complications were discussed with the                            patient. Return to normal activities tomorrow.                            Written discharge instructions were provided to the                            patient.                           - Resume previous diet.                           - Continue present medications.                           - Await pathology results.                           - No ibuprofen, naproxen, or other non-steroidal                            anti-inflammatory drugs.                           - Use Protonix (pantoprazole) 40 mg PO daily for 3                            months.                           - Use sucralfate suspension 1 gram PO QID for 2                            weeks.                           - Return to GI office in 6 months. Napoleon Form, MD 04/17/2023 2:42:15 PM This report has been signed electronically.

## 2023-04-17 NOTE — Progress Notes (Signed)
Please refer to office visit note 04/06/23. No additional changes in H&P Patient is appropriate for planned procedure(s) and anesthesia in an ambulatory setting  K. Scherry Ran , MD [phone]

## 2023-04-17 NOTE — Progress Notes (Signed)
Uneventful anesthetic. Report to pacu rn. Vss on [place] O2. Care resumed by rn.

## 2023-04-20 ENCOUNTER — Telehealth: Payer: Self-pay

## 2023-04-20 NOTE — Telephone Encounter (Signed)
No answer, left message to call if having any issues or concerns, B. RN 

## 2023-04-27 ENCOUNTER — Other Ambulatory Visit (HOSPITAL_COMMUNITY): Payer: Self-pay

## 2023-04-28 ENCOUNTER — Other Ambulatory Visit (HOSPITAL_COMMUNITY): Payer: Self-pay

## 2023-05-06 ENCOUNTER — Encounter: Payer: Self-pay | Admitting: Gastroenterology

## 2023-05-19 ENCOUNTER — Other Ambulatory Visit (HOSPITAL_BASED_OUTPATIENT_CLINIC_OR_DEPARTMENT_OTHER): Payer: Self-pay

## 2023-05-20 DIAGNOSIS — Z1231 Encounter for screening mammogram for malignant neoplasm of breast: Secondary | ICD-10-CM | POA: Diagnosis not present

## 2023-05-20 LAB — HM MAMMOGRAPHY

## 2023-06-15 ENCOUNTER — Encounter: Payer: Self-pay | Admitting: Hematology

## 2023-06-17 NOTE — Progress Notes (Signed)
Dawn [name]    [id number]    1951-05-30  Primary Care Physician:Metheney, Barbarann Ehlers, MD  Referring Physician: Agapito Games, MD [address] [place],  [place] [address]   Chief complaint:  Chief Complaint  Patient presents with   gastric varices    No complaints today   HPI: Patient has history of breast and endometrial cancer diagnosed in 2018. She has undergone a Lumpectomy in 2018 and has been in remission since 2021. Patient has history of diverticulosis. Family history of colon cancer.   Dawn [name] is a very pleasant 72 y.o. female presenting to clinic today for follow-up for esophageal gastric varices s/p BRTO Last seen on 04-06-23  Today, she complains of abdominal discomfort, mostly located in the RLQ region and accompanying bloating. She states that her BM are typically normal but occasionally she would experience an explosive BM. She states that her milk intake is typically minimal. We discussed the possibility of being lactose intolerant and advised on recommended lifestyle changes.   She states that she typically takes all her medications at once before bedtime. She also reports compliance with pantoprazole 40 mg once daily and states that she has finished her Carafate.    Patient denies diarrhea, constipation, nausea, blood in stool, black stool, vomiting, unintentional weight loss, reflux, or dysphagia.  She recalls an episode of chronic coughing that occurred back in April. She was tested for Covid which was negative   GI Hx: EGD 04-17-23  - Grade I esophageal varices.  - Esophageal ulcers with no bleeding and no stigmata of recent bleeding.  - 3 cm hiatal hernia.  - Gastritis. Biopsied.  - Type 1 isolated gastric varices (IGV1, varices located in the fundus), status post previous eradication.  - Normal examined duodenum. Surgical [P], gastric antrum and gastric body - ANTRAL AND OXYNTIC MUCOSA WITH MILD  CHEMICAL/REACTIVE CHANGE, OTHERWISE NO SIGNIFICANT PATHOLOGY. - NO HELICOBACTER PYLORI ORGANISMS IDENTIFIED ON H&E STAINED SLIDE.  CT Angio Abd/ pelvis brto 03-16-23 1. Resolution of previously visualized gastric varix status post retrograde coil assisted transvenous obliteration. 2.  Aortic Atherosclerosis (ICD10-I70.0).  Liver biopsy 02/16/2023 A. LIVER, RIGHT LOBE, NEEDLE CORE BIOPSY:  -  Mild active steatohepatitis with thin fibrous septa   CT angio abdomen pelvis 03/13/2023 1. Resolution of previously visualized gastric varix status post retrograde coil assisted transvenous obliteration. 2.  Aortic Atherosclerosis (ICD10-I70.0).  EGD 12-24-22 - Grade I esophageal varices.  - Z-line regular, 38 cm from the incisors.  - Type 2 gastroesophageal varices (GOV2, esophageal varices which extend along the fundus), without bleeding.  - Portal hypertensive gastropathy.  - Normal examined duodenum.  - No specimens collected.  Cirrhosis on recent CT at the request of Dr. Nani Gasser.  EGD 11-22-19 - Moderate inflammation characterized by erosions, erythema and granularity was found in the gastric antrum. Biopsies were taken with a cold forceps for histology. - Isolated proximal stomach varices along the greater curvature of the stomach. No signs of recent bleeding. - Mild reflux related esophagitis (LA Grade A). - The examination was otherwise normal. Pathology: Surgical [P], colon, transverse, polyp (2) - TUBULAR ADENOMA. - SESSILE SERRATED POLYP WITHOUT DYSPLASIA. - NO HIGH GRADE DYSPLASIA OR MALIGNANCY.  COLONOSCOPY 11-22-19 - Two 2 to 4 mm polyps in the transverse colon, removed with a cold snare. Resected and retrieved. - Diverticulosis in the left colon. - The examination was otherwise normal on direct and  retroflexion views. Pathology: Surgical [P], gastric antrum and gastric body - MILD CHRONIC GASTRITIS. - WARTHIN-STARRY IS NEGATIVE FOR HELICOBACTER PYLORI. - NO INTESTINAL  METAPLASIA, DYSPLASIA, OR MALIGNANCY.  COLONOSCOPY 10-26-14 1. Normal colonoscopy  2. There was mild diverticulosis noted in the sigmoid colon  3.long redundant hypotonic colon. Difficult exam. Decreased haustral folds, suspect colonic inertia  COLONOSCOPY 09-18-2009 -mild diverticulosis noted in the sigmoid colon   Current Outpatient Medications:    acetaminophen (TYLENOL) 500 MG tablet, Take 500 mg by mouth every 6 (six) hours as needed for moderate pain., Disp: , Rfl:    Alpha-Lipoic Acid 600 MG CAPS, Take 1 capsule (600 mg total) by mouth daily., Disp: 90 capsule, Rfl: 3   anastrozole (ARIMIDEX) 1 MG tablet, Take 1 tablet (1 mg total) by mouth daily., Disp: 90 tablet, Rfl: 3   Ascorbic Acid (VITAMIN C) 1000 MG tablet, Take 1,000 mg by mouth daily., Disp: , Rfl:    atorvastatin (LIPITOR) 20 MG tablet, Take 1 tablet (20 mg total) by mouth daily., Disp: 90 tablet, Rfl: 1   b complex vitamins capsule, Take 1 capsule by mouth every other day., Disp: , Rfl:    Cholecalciferol (VITAMIN D3) 75 MCG (3000 UT) TABS, Take 1 capsule by mouth daily., Disp: , Rfl:    fluticasone (FLONASE) 50 MCG/ACT nasal spray, Place 2 sprays into both nostrils daily., Disp: 48 g, Rfl: 3   levothyroxine (SYNTHROID) 50 MCG tablet, TAKE 1 TABLET BY MOUTH  DAILY, Disp: 90 tablet, Rfl: 3   Multiple Vitamin (MULTIVITAMIN) capsule, Take 1 capsule by mouth daily., Disp: , Rfl:    olmesartan-hydrochlorothiazide (BENICAR HCT) 20-12.5 MG tablet, Take 1 tablet by mouth daily., Disp: 90 tablet, Rfl: 3   pantoprazole (PROTONIX) 40 MG tablet, Take 1 tablet (40 mg total) by mouth daily., Disp: 90 tablet, Rfl: 1   pyridOXINE (VITAMIN B-6) 50 MG tablet, Take 1 tablet (50 mg total) by mouth daily., Disp: 90 tablet, Rfl: 3   triamcinolone cream (KENALOG) 0.1 %, Apply 1 application topically 2 (two) times daily. (Patient taking differently: Apply 1 Application topically as needed.), Disp: 30 g, Rfl: 1   Allergies as of 06/18/2023 - Review  Complete 06/18/2023  Allergen Reaction Noted   Ciprofloxacin Hives 05/25/2017   Paclitaxel Rash 11/19/2017   Crestor [rosuvastatin calcium] Other (See Comments) 09/26/2011   Fosamax [alendronate sodium] Other (See Comments) 11/19/2017   Livalo [pitavastatin] Other (See Comments) 06/08/2020   Penicillins Rash 04/22/2007    Past Medical History:  Diagnosis Date   Allergy 2018   Anemia    as a child.   Arthritis    Atypical mole 06/04/2018   moderate top of right foot   Atypical mole 06/04/2018   left elbow mild   Atypical mole 06/04/2018   left upper back mild   Bilateral cataracts    Bilateral leg cramps    Cancer (HCC)    skin   Dyspnea    Endometrial cancer (HCC)    Family history of bladder cancer    Family history of colon cancer in father    Fatty liver    Fuchs' corneal dystrophy    GERD (gastroesophageal reflux disease)    Hearing loss    Right side 30%   Heart murmur 1960   Heartburn    History of hiatal hernia    small size   History of radiation therapy 08/21/17, 08/28/17,09/01/17, 09/11/17, 09/17/17   Vaginal cuff brachytherapy.    History of radiation therapy 11/11/17- 12/08/17  Right Breast treated to 40.05 Gy with 15 fx of 2.67 Gy and a boost of 10 Gy with 5 fx.    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   Uterine cancer (HCC) 04/2017   endometrial cancer   Varices, gastric    Vertigo     Past Surgical History:  Procedure Laterality Date   ABDOMINAL HYSTERECTOMY  2018   BREAST BIOPSY Right 04/27/2017   BREAST LUMPECTOMY WITH RADIOACTIVE SEED AND SENTINEL LYMPH NODE BIOPSY  Right 05/25/2017   Procedure: RIGHT BREAST LUMPECTOMY WITH RADIOACTIVE SEED AND  RIGHT AXILLARY SENTINEL LYMPH NODE BIOPSY;  Surgeon: Glenna Fellows, MD;  Location: [facility];  Service: General;  Laterality: Right;   CATARACT EXTRACTION, BILATERAL     COLONOSCOPY     EYE SURGERY  2018   HYSTEROSCOPY WITH D & C N/A 04/29/2017   Procedure: DILATATION AND CURETTAGE /HYSTEROSCOPY;  Surgeon: Mitchel Honour, DO;  Location: [facility];  Service: Gynecology;  Laterality: N/A;   IR ANGIOGRAM SELECTIVE EACH ADDITIONAL VESSEL  02/16/2023   IR EMBO ART  VEN HEMORR LYMPH EXTRAV  INC GUIDE ROADMAPPING  02/16/2023   IR RADIOLOGIST EVAL & MGMT  01/27/2023   IR RADIOLOGIST EVAL & MGMT  03/18/2023   IR TRANSCATHETER BX  02/16/2023   IR US GUIDE VASC ACCESS RIGHT  02/16/2023   IR VENOGRAM RENAL UNI LEFT  02/16/2023   PORT-A-CATH REMOVAL Left 04/16/2021   Procedure: REMOVAL PORT-A-CATH;  Surgeon: Luretha Murphy, MD;  Location: [facility];  Service: General;  Laterality: Left;   PORTACATH PLACEMENT Left 05/25/2017   Procedure: INSERTION PORT-A-CATH;  Surgeon: Glenna Fellows, MD;  Location: [facility];  Service: General;  Laterality: Left;   ROBOTIC ASSISTED TOTAL HYSTERECTOMY WITH BILATERAL SALPINGO OOPHERECTOMY N/A 06/09/2017   Procedure: XI ROBOTIC ASSISTED TOTAL LAPOROSCOPIC HYSTERECTOMY WITH BILATERAL SALPINGO OOPHORECTOMY;  Surgeon: Adolphus Birchwood, MD;  Location: [facility];  Service: Gynecology;  Laterality: N/A;   SENTINEL NODE BIOPSY N/A 06/09/2017   Procedure: SENTINEL NODE BIOPSY;  Surgeon: Adolphus Birchwood, MD;  Location: [facility];  Service: Gynecology;  Laterality: N/A;    Family History  Problem Relation Age of Onset   Stroke Mother 15   Depression Mother    Anxiety disorder Mother    Bipolar disorder Mother    Obesity Mother    Colon cancer Father 55   Heart attack Father 76   Prostate cancer Father        dx 33's   Bladder Cancer Father 32   Hypertension Father     Hyperlipidemia Father    Heart disease Father    Obesity Father    Hyperlipidemia Sister    Asthma Brother    Hyperlipidemia Brother    Heart attack Maternal Grandmother 67   Heart disease Maternal Grandmother    Diabetes Maternal Grandfather    Kidney disease Maternal Grandfather    Cancer Paternal Grandmother 45       GYN cancer ( thinks ovarian, maybe uterine)   Anxiety disorder Daughter  Anxiety disorder Son    Breast cancer Other 9   Colon cancer Other 35   Cancer Other        type unk, age dx unk   Esophageal cancer Neg Hx    Stomach cancer Neg Hx    Rectal cancer Neg Hx     Social History   Socioeconomic History   Marital status: Married    Spouse name: Maurine Minister   Number of children: 2   Years of education: 16   Highest education level: Bachelor's degree (e.g., BA, AB, BS)  Occupational History    Employer: [employer]    Comment: [employer]   Tobacco Use   Smoking status: Never   Smokeless tobacco: Never  Vaping Use   Vaping status: Never Used  Substance and Sexual Activity   Alcohol use: Not Currently    Comment: <1/week wine or beer occasional   Drug use: No   Sexual activity: Yes    Birth control/protection: Post-menopausal  Other Topics Concern   Not on file  Social History Narrative   Lives with her husband. Still works part time. She enjoys hiking, walking and gardening.     Social Determinants of Health   Financial Resource Strain: Low Risk  (11/20/2022)   Overall Financial Resource Strain (CARDIA)    Difficulty of Paying Living Expenses: Not hard at all  Food Insecurity: No Food Insecurity (01/04/2023)   Hunger Vital Sign    Worried About Running Out of Food in the Last Year: Never true    Ran Out of Food in the Last Year: Never true  Transportation Needs: No Transportation Needs (01/04/2023)   PRAPARE - Administrator, Civil Service (Medical): No    Lack of Transportation (Non-Medical): No  Physical  Activity: Sufficiently Active (01/04/2023)   Exercise Vital Sign    Days of Exercise per Week: 4 days    Minutes of Exercise per Session: 60 min  Stress: No Stress Concern Present (03/23/2023)   [employer] of Occupational Health - Occupational Stress Questionnaire    Feeling of Stress : Only a little  Social Connections: Socially Integrated (01/04/2023)   Social Connection and Isolation Panel [NHANES]    Frequency of Communication with Friends and Family: Twice a week    Frequency of Social Gatherings with Friends and Family: Once a week    Attends Religious Services: More than 4 times per year    Active Member of [employer] or Organizations: Yes    Attends Engineer, structural: More than 4 times per year    Marital Status: Married  Catering manager Violence: Not At Risk (11/24/2022)   Humiliation, Afraid, Rape, and Kick questionnaire    Fear of Current or Ex-Partner: No    Emotionally Abused: No    Physically Abused: No    Sexually Abused: No      Review of systems: Review of Systems  Constitutional:  Negative for unexpected weight change.  HENT:  Negative for trouble swallowing.   Gastrointestinal:  Positive for abdominal distention (bloating) and abdominal pain. Negative for anal bleeding, blood in stool, constipation, diarrhea, nausea, rectal pain and vomiting.     Physical Exam: Vitals:   06/18/23 1040  BP: 132/80  Pulse: 88     Body mass index is 33.26 kg/m. General: well-appearing   Eyes: sclera anicteric, no redness ENT: oral mucosa moist without lesions, no cervical or supraclavicular lymphadenopathy CV: RRR, no JVD, no peripheral edema Resp: clear  to auscultation bilaterally, normal RR and effort noted GI: soft, no tenderness, with active bowel sounds. No guarding or palpable organomegaly noted. Skin; warm and dry, no rash or jaundice noted Neuro: awake, alert and oriented x 3. Normal gross motor function and fluent speech'  Data  Reviewed:  Reviewed labs, radiology imaging, old records and pertinent past GI work up   Assessment and Plan/Recommendations: 72 year old very pleasant female with history of endometrial and breast cancer diagnosed in 2018, in clinical remission s/p treatment, longstanding history of steatohepatitis here for follow-up visit s/p BRTO of gastric varices  Liver biopsy did not show any signs of cirrhosis or significant fibrosis Did not have elevated portal vein pressure Discontinued nonselective beta-blocker No evidence of volume overload  Workup negative for any other etiology of chronic liver disease other than NASH/MAFLD Discussed dietary modification with limiting sugar intake and simple carbohydrates Continue daily exercise Avoid alcohol  Continue daily vitamin E 400 international units [over-the-counter]  Due for surveillance colonoscopy in 2026  IBS -lower abdominal discomfort and cramping: Use IBgard 1 capsule up to 3 times daily as needed Use antispasmodic dicyclomine 10 mg every 8 hours as needed if has persistent abdominal discomfort or cramping Trial of lactulose free diet  Return in 6 months or sooner if needed   The patient was provided an opportunity to ask questions and all were answered. The patient agreed with the plan and demonstrated an understanding of the instructions.   I,Safa M Kadhim,acting as a scribe for Marsa Aris, MD.,have documented all relevant documentation on the behalf of Marsa Aris, MD,as directed by  Marsa Aris, MD while in the presence of Marsa Aris, MD.   I, Marsa Aris, MD, have reviewed all documentation for this visit. The documentation on 06/18/23 for the exam, diagnosis, procedures, and orders are all accurate and complete.   Iona Beard , MD  Ladona Mow Hewitt [employer]orts as a scribe for Marsa Aris, MD.,have documented all relevant documentation on the behalf of Marsa Aris, MD,as directed by  Marsa Aris, MD while in the presence of Marsa Aris, MD.   I, Marsa Aris, MD, have reviewed all documentation for this visit. The documentation on 06/18/23 for the exam, diagnosis, procedures, and orders are all accurate and complete.   CC: Agapito Games, *

## 2023-06-18 ENCOUNTER — Encounter: Payer: Self-pay | Admitting: Gastroenterology

## 2023-06-18 ENCOUNTER — Other Ambulatory Visit (INDEPENDENT_AMBULATORY_CARE_PROVIDER_SITE_OTHER): Payer: PPO

## 2023-06-18 ENCOUNTER — Other Ambulatory Visit (HOSPITAL_COMMUNITY): Payer: Self-pay

## 2023-06-18 ENCOUNTER — Ambulatory Visit: Payer: PPO | Admitting: Gastroenterology

## 2023-06-18 VITALS — BP 132/80 | HR 88 | Ht 62.75 in | Wt 186.2 lb

## 2023-06-18 DIAGNOSIS — K2100 Gastro-esophageal reflux disease with esophagitis, without bleeding: Secondary | ICD-10-CM

## 2023-06-18 DIAGNOSIS — Z23 Encounter for immunization: Secondary | ICD-10-CM | POA: Diagnosis not present

## 2023-06-18 DIAGNOSIS — K7581 Nonalcoholic steatohepatitis (NASH): Secondary | ICD-10-CM

## 2023-06-18 DIAGNOSIS — E739 Lactose intolerance, unspecified: Secondary | ICD-10-CM

## 2023-06-18 DIAGNOSIS — I864 Gastric varices: Secondary | ICD-10-CM | POA: Diagnosis not present

## 2023-06-18 LAB — COMPREHENSIVE METABOLIC PANEL
ALT: 35 U/L (ref 0–35)
AST: 24 U/L (ref 0–37)
Albumin: 4.5 g/dL (ref 3.5–5.2)
Alkaline Phosphatase: 43 U/L (ref 39–117)
BUN: 12 mg/dL (ref 6–23)
CO2: 30 meq/L (ref 19–32)
Calcium: 9.8 mg/dL (ref 8.4–10.5)
Chloride: 100 meq/L (ref 96–112)
Creatinine, Ser: 0.9 mg/dL (ref 0.40–1.20)
GFR: 64.36 mL/min (ref >60.00)
Glucose, Bld: 108 mg/dL — ABNORMAL HIGH (ref 70–99)
Potassium: 3.5 meq/L (ref 3.5–5.1)
Sodium: 137 meq/L (ref 135–145)
Total Bilirubin: 0.6 mg/dL (ref 0.2–1.2)
Total Protein: 7.8 g/dL (ref 6.0–8.3)

## 2023-06-18 LAB — CBC WITH DIFFERENTIAL/PLATELET
Basophils Absolute: 0.1 10*3/uL (ref 0.0–0.1)
Basophils Relative: 1.1 % (ref 0.0–3.0)
Eosinophils Absolute: 0.1 10*3/uL (ref 0.0–0.7)
Eosinophils Relative: 2.1 % (ref 0.0–5.0)
HCT: 43.4 % (ref 36.0–46.0)
Hemoglobin: 14.7 g/dL (ref 12.0–15.0)
Lymphocytes Relative: 35.9 % (ref 12.0–46.0)
Lymphs Abs: 1.9 10*3/uL (ref 0.7–4.0)
MCHC: 34 g/dL (ref 30.0–36.0)
MCV: 97.8 fL (ref 78.0–100.0)
Monocytes Absolute: 0.6 10*3/uL (ref 0.1–1.0)
Monocytes Relative: 11.4 % (ref 3.0–12.0)
Neutro Abs: 2.7 10*3/uL (ref 1.4–7.7)
Neutrophils Relative %: 49.5 % (ref 43.0–77.0)
Platelets: 201 10*3/uL (ref 150.0–400.0)
RBC: 4.43 Mil/uL (ref 3.87–5.11)
RDW: 12.7 % (ref 11.5–15.5)
WBC: 5.4 10*3/uL (ref 4.0–10.5)

## 2023-06-18 LAB — PROTIME-INR
INR: 1.1 {ratio} — ABNORMAL HIGH (ref 0.8–1.0)
Prothrombin Time: 12 s (ref 9.6–13.1)

## 2023-06-18 MED ORDER — DICYCLOMINE HCL 10 MG PO CAPS
10.0000 mg | ORAL_CAPSULE | Freq: Every day | ORAL | 3 refills | Status: DC | PRN
  Filled 2023-06-18: qty 90, 90d supply, fill #0

## 2023-06-18 MED ORDER — PANTOPRAZOLE SODIUM 20 MG PO TBEC
20.0000 mg | DELAYED_RELEASE_TABLET | Freq: Every day | ORAL | 3 refills | Status: DC
  Filled 2023-06-18: qty 30, 30d supply, fill #0
  Filled 2023-07-14: qty 30, 30d supply, fill #1
  Filled 2023-08-13: qty 30, 30d supply, fill #2
  Filled 2023-09-13: qty 30, 30d supply, fill #3

## 2023-06-18 NOTE — Patient Instructions (Signed)
Your provider has requested that you go to the basement level for lab work before leaving today. Press "B" on the elevator. The lab is located at the first door on the left as you exit the elevator.  We have given you the Flu Shot today    We have sent the following medications to your pharmacy for you to pick up at your convenience: Pantoprazole 20 mg  Dicyclomine   Use Lactaid tablets as needed or limit your intake of lactulose   Follow up in 6 months  Due to recent changes in healthcare laws, you may see the results of your imaging and laboratory studies on MyChart before your provider has had a chance to review them.  We understand that in some cases there may be results that are confusing or concerning to you. Not all laboratory results come back in the same time frame and the provider may be waiting for multiple results in order to interpret others.  Please give [name] 48 hours in order for your provider to thoroughly review all the results before contacting the office for clarification of your results.    _______________________________________________________  If your blood pressure at your visit was 140/90 or greater, please contact your primary care physician to follow up on this.  _______________________________________________________  If you are age 71 or older, your body mass index should be between 23-30. Your Body mass index is 33.26 kg/m. If this is out of the aforementioned range listed, please consider follow up with your Primary Care Provider.  If you are age 71 or younger, your body mass index should be between 19-25. Your Body mass index is 33.26 kg/m. If this is out of the aformentioned range listed, please consider follow up with your Primary Care Provider.   ________________________________________________________  The [facility] providers would like to encourage you to use [facility] to communicate with providers for non-urgent requests or questions.  Due to long hold  times on the telephone, sending your provider a message by [facility] may be a faster and more efficient way to get a response.  Please allow 48 business hours for a response.  Please remember that this is for non-urgent requests.  _______________________________________________________   I appreciate the  opportunity to care for you  Thank You   Marsa Aris , MD

## 2023-06-19 ENCOUNTER — Other Ambulatory Visit (HOSPITAL_COMMUNITY): Payer: Self-pay

## 2023-06-30 ENCOUNTER — Encounter: Payer: Self-pay | Admitting: Gastroenterology

## 2023-07-08 ENCOUNTER — Encounter: Payer: Self-pay | Admitting: Family Medicine

## 2023-07-08 ENCOUNTER — Ambulatory Visit (INDEPENDENT_AMBULATORY_CARE_PROVIDER_SITE_OTHER): Payer: PPO | Admitting: Family Medicine

## 2023-07-08 VITALS — BP 125/62 | HR 79 | Ht 62.75 in | Wt 187.0 lb

## 2023-07-08 DIAGNOSIS — I700 Atherosclerosis of aorta: Secondary | ICD-10-CM | POA: Diagnosis not present

## 2023-07-08 DIAGNOSIS — I10 Essential (primary) hypertension: Secondary | ICD-10-CM | POA: Diagnosis not present

## 2023-07-08 DIAGNOSIS — R7301 Impaired fasting glucose: Secondary | ICD-10-CM | POA: Diagnosis not present

## 2023-07-08 DIAGNOSIS — E785 Hyperlipidemia, unspecified: Secondary | ICD-10-CM

## 2023-07-08 DIAGNOSIS — C541 Malignant neoplasm of endometrium: Secondary | ICD-10-CM | POA: Diagnosis not present

## 2023-07-08 DIAGNOSIS — H9313 Tinnitus, bilateral: Secondary | ICD-10-CM | POA: Diagnosis not present

## 2023-07-08 LAB — POCT GLYCOSYLATED HEMOGLOBIN (HGB A1C): Hemoglobin A1C: 5.7 % — AB (ref 4.0–5.6)

## 2023-07-08 NOTE — Progress Notes (Addendum)
Established Patient Office Visit  Subjective   Patient ID: Dawn [name], female    DOB: 05-Nov-1950  Age: 72 y.o. MRN: [id number]  Chief Complaint  Patient presents with   Hypertension   ifg    HPI  Hypertension- Pt denies chest pain, SOB, dizziness, or heart palpitations.  Taking meds as directed w/o problems.  Denies medication side effects.    Hyperlipidemia - tolerating stating well with no myalgias or significant side effects.  Lab Results  Component Value Date   CHOL 168 07/02/2022   HDL 55 07/02/2022   LDLCALC 87 07/02/2022   TRIG 164 (H) 07/02/2022   CHOLHDL 3.1 07/02/2022      Impaired fasting glucose-no increased thirst or urination. No symptoms consistent with hypoglycemia.  Also last 2 days has had a scratchy throat and the ringing in her ears has gotten louder. Thinks may be allergies. No fever or chills.     ROS    Objective:     BP 125/62   Pulse 79   Ht 5' 2.75" (1.594 m)   Wt 187 lb (84.8 kg)   SpO2 97%   BMI 33.39 kg/m    Physical Exam Vitals and nursing note reviewed.  Constitutional:      Appearance: Normal appearance.  HENT:     Head: Normocephalic and atraumatic.     Right Ear: Tympanic membrane, ear canal and external ear normal. There is no impacted cerumen.     Left Ear: Tympanic membrane, ear canal and external ear normal. There is no impacted cerumen.     Nose: Nose normal.     Mouth/Throat:     Pharynx: Oropharynx is clear.  Eyes:     Conjunctiva/sclera: Conjunctivae normal.  Cardiovascular:     Rate and Rhythm: Normal rate and regular rhythm.  Pulmonary:     Effort: Pulmonary effort is normal.     Breath sounds: Normal breath sounds.  Musculoskeletal:     Cervical back: Neck supple. No tenderness.  Lymphadenopathy:     Cervical: No cervical adenopathy.  Skin:    General: Skin is warm and dry.  Neurological:     Mental Status: She is alert and oriented to person, place, and time.  Psychiatric:        Mood and  Affect: Mood normal.      Results for orders placed or performed in visit on 07/08/23  POCT HgB A1C  Result Value Ref Range   Hemoglobin A1C 5.7 (A) 4.0 - 5.6 %   HbA1c POC (<> result, manual entry)     HbA1c, POC (prediabetic range)     HbA1c, POC (controlled diabetic range)        The 10-year ASCVD risk score (Arnett DK, et al., 2019) is: 12.8%    Assessment & Plan:   Problem List Items Addressed This Visit       Cardiovascular and Mediastinum   HYPERTENSION, BENIGN - Primary    Well controlled. Continue current regimen. Follow up in  6 mo       Relevant Orders   Lipoprotein A (LPA)   Lipid panel   Aortic atherosclerosis (HCC)    She is currently on a statin but would like to have her lipoprotein a checked today.        Endocrine   IFG (impaired fasting glucose)    A1c looks great today at 5.7.  Lab Results  Component Value Date   HGBA1C 5.7 (A) 07/08/2023  Relevant Orders   POCT HgB A1C (Completed)   Lipoprotein A (LPA)   Lipid panel     Genitourinary   Endometrial cancer (HCC)    Will likely stay on Arimidex for the rest of her life.        Other   Tinnitus of both ears   Hyperlipidemia    Plan. to recheck lipid panel. Current LDL goal is under 100.       Relevant Orders   Lipoprotein A (LPA)   Lipid panel    ENT exam normal- call back if feeling worse or turning into URI sxs.    Return in about 6 months (around 01/06/2024) for Pre-diabetes.    Nani Gasser, MD

## 2023-07-08 NOTE — Assessment & Plan Note (Signed)
Plan. to recheck lipid panel. Current LDL goal is under 100.

## 2023-07-08 NOTE — Assessment & Plan Note (Signed)
She is currently on a statin but would like to have her lipoprotein a checked today.

## 2023-07-08 NOTE — Assessment & Plan Note (Signed)
Will likely stay on Arimidex for the rest of her life.

## 2023-07-08 NOTE — Assessment & Plan Note (Signed)
A1c looks great today at 5.7.  Lab Results  Component Value Date   HGBA1C 5.7 (A) 07/08/2023

## 2023-07-08 NOTE — Assessment & Plan Note (Signed)
Well controlled. Continue current regimen. Follow up in  6 mo  

## 2023-07-09 LAB — LIPID PANEL
Chol/HDL Ratio: 3.6 ratio (ref 0.0–4.4)
Cholesterol, Total: 174 mg/dL (ref 100–199)
HDL: 49 mg/dL (ref >39)
LDL Chol Calc (NIH): 93 mg/dL (ref 0–99)
Triglycerides: 186 mg/dL — ABNORMAL HIGH (ref 0–149)
VLDL Cholesterol Cal: 32 mg/dL (ref 5–40)

## 2023-07-09 LAB — LIPOPROTEIN A (LPA): Lipoprotein (a): 87.2 nmol/L — ABNORMAL HIGH (ref <75.0)

## 2023-07-09 NOTE — Progress Notes (Signed)
Hi Dawn [name], your lipoprotein a did come back elevated around 87 I know you were curious.  Your LDL is under 100 which is great.  Triglycerides up still a little bit at 186.  But better than a year ago.  Continue work on [employer] and regular exercise

## 2023-07-15 ENCOUNTER — Other Ambulatory Visit (HOSPITAL_BASED_OUTPATIENT_CLINIC_OR_DEPARTMENT_OTHER): Payer: Self-pay

## 2023-08-12 ENCOUNTER — Ambulatory Visit (INDEPENDENT_AMBULATORY_CARE_PROVIDER_SITE_OTHER): Payer: PPO | Admitting: Family Medicine

## 2023-08-12 ENCOUNTER — Other Ambulatory Visit (HOSPITAL_BASED_OUTPATIENT_CLINIC_OR_DEPARTMENT_OTHER): Payer: Self-pay

## 2023-08-12 ENCOUNTER — Encounter: Payer: Self-pay | Admitting: Family Medicine

## 2023-08-12 DIAGNOSIS — R21 Rash and other nonspecific skin eruption: Secondary | ICD-10-CM | POA: Diagnosis not present

## 2023-08-12 MED ORDER — TRIAMCINOLONE ACETONIDE 0.1 % EX CREA
1.0000 | TOPICAL_CREAM | Freq: Two times a day (BID) | CUTANEOUS | 1 refills | Status: DC
Start: 2023-08-12 — End: 2023-12-22
  Filled 2023-08-12: qty 30, 15d supply, fill #0
  Filled 2023-08-22 – 2023-08-24 (×2): qty 30, 15d supply, fill #1

## 2023-08-12 MED ORDER — PREDNISONE 20 MG PO TABS
20.0000 mg | ORAL_TABLET | Freq: Two times a day (BID) | ORAL | 0 refills | Status: AC
  Filled 2023-08-12: qty 10, 5d supply, fill #0

## 2023-08-12 NOTE — Assessment & Plan Note (Signed)
?  If related to previous chemotherapy/immunotherapy for cancer.  She does also have hypothyroidism and rash related to hashimotos could be a contributor.  She has responded well to steroids in the past.  Adding 5 day burst of 40mg  prednisone and renewed triamcinolone cream.

## 2023-08-12 NOTE — Progress Notes (Signed)
Dawn [name] - 72 y.o. female MRN [id number]  Date of birth: 1950/11/14  Subjective Chief Complaint  Patient presents with   Rash    HPI Dawn [name] is a 72 y.o. female here today with complaint of rash on her chest.  Rash appeared a few days ago.  She has not changed bathing or beauty products and/or washing detergents.  She has had issues with similar rashes in the past.  She has used OTC cortisone with minimal improvement.  Denies pain associated with rash.  No fever or chills.   ROS:  A comprehensive ROS was completed and negative except as noted per HPI  Allergies  Allergen Reactions   Ciprofloxacin Hives   Paclitaxel Rash   Crestor [Rosuvastatin Calcium] Other (See Comments)    Myalgias    Fosamax [Alendronate Sodium] Other (See Comments)    reflux   Livalo [Pitavastatin] Other (See Comments)    Difficulty walking. Weakness in legs.    Penicillins Rash    Has patient had a PCN reaction causing immediate rash, facial/tongue/throat swelling, SOB or lightheadedness with hypotension: Yes Has patient had a PCN reaction causing severe rash involving mucus membranes or skin necrosis: Yes Has patient had a PCN reaction that required hospitalization: No Has patient had a PCN reaction occurring within the last 10 years: No If all of the above answers are "NO", then may proceed with Cephalosporin use.     Past Medical History:  Diagnosis Date   Allergy 2018   Anemia    as a child.   Arthritis    Atypical mole 06/04/2018   moderate top of right foot   Atypical mole 06/04/2018   left elbow mild   Atypical mole 06/04/2018   left upper back mild   Bilateral cataracts    Bilateral leg cramps    Cancer (HCC)    skin   Dyspnea    Endometrial cancer (HCC)    Family history of bladder cancer    Family history of colon cancer in father    Fatty liver    Fuchs' corneal dystrophy    GERD (gastroesophageal reflux disease)    Hearing loss    Right side 30%   Heart murmur  1960   Heartburn    History of hiatal hernia    small size   History of radiation therapy 08/21/17, 08/28/17,09/01/17, 09/11/17, 09/17/17   Vaginal cuff brachytherapy.    History of radiation therapy 11/11/17- 12/08/17   Right Breast treated to 40.05 Gy with 15 fx of 2.67 Gy and a boost of 10 Gy with 5 fx.    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   Uterine cancer (HCC) 04/2017   endometrial cancer   Varices, gastric    Vertigo     Past Surgical History:  Procedure Laterality Date   ABDOMINAL HYSTERECTOMY  2018   BREAST BIOPSY Right 04/27/2017   BREAST LUMPECTOMY WITH RADIOACTIVE SEED AND SENTINEL LYMPH NODE BIOPSY Right 05/25/2017   Procedure: RIGHT BREAST LUMPECTOMY WITH RADIOACTIVE SEED AND  RIGHT AXILLARY SENTINEL LYMPH NODE BIOPSY;  Surgeon: Glenna Fellows, MD;  Location: [facility];  Service: General;  Laterality: Right;   CATARACT EXTRACTION, BILATERAL     COLONOSCOPY     EYE SURGERY  2018   HYSTEROSCOPY WITH D & C N/A 04/29/2017   Procedure: DILATATION AND CURETTAGE /HYSTEROSCOPY;  Surgeon: Mitchel Honour, DO;  Location: [facility];  Service: Gynecology;  Laterality: N/A;   IR ANGIOGRAM SELECTIVE EACH ADDITIONAL VESSEL  02/16/2023   IR EMBO ART  VEN HEMORR LYMPH EXTRAV  INC GUIDE ROADMAPPING  02/16/2023   IR RADIOLOGIST EVAL & MGMT  01/27/2023   IR RADIOLOGIST EVAL & MGMT  03/18/2023   IR TRANSCATHETER BX  02/16/2023   IR US GUIDE VASC ACCESS RIGHT  02/16/2023   IR VENOGRAM RENAL UNI LEFT  02/16/2023   PORT-A-CATH REMOVAL Left 04/16/2021   Procedure: REMOVAL PORT-A-CATH;  Surgeon: Luretha Murphy, MD;  Location: [facility];  Service: General;  Laterality: Left;   PORTACATH PLACEMENT Left 05/25/2017   Procedure: INSERTION PORT-A-CATH;  Surgeon: Glenna Fellows, MD;  Location: [facility];  Service: General;  Laterality: Left;   ROBOTIC ASSISTED TOTAL HYSTERECTOMY WITH BILATERAL SALPINGO OOPHERECTOMY N/A 06/09/2017   Procedure: XI ROBOTIC ASSISTED TOTAL LAPOROSCOPIC HYSTERECTOMY WITH BILATERAL SALPINGO OOPHORECTOMY;  Surgeon: Adolphus Birchwood, MD;  Location: [facility];  Service: Gynecology;  Laterality: N/A;   SENTINEL NODE BIOPSY N/A 06/09/2017   Procedure: SENTINEL NODE BIOPSY;  Surgeon: Adolphus Birchwood, MD;  Location: [facility];  Service: Gynecology;  Laterality: N/A;    Social History   Socioeconomic History   Marital status: Married    Spouse name: Maurine Minister   Number of children: 2   Years of education: 16   Highest education level: Bachelor's degree (e.g., BA, AB, BS)  Occupational History    Employer:  [employer]    Comment: [employer]   Tobacco Use   Smoking status: Never   Smokeless tobacco: Never  Vaping Use   Vaping status: Never Used  Substance and Sexual Activity   Alcohol use: Not Currently    Comment: <1/week wine or beer occasional   Drug use: No   Sexual activity: Yes    Birth control/protection: Post-menopausal  Other Topics Concern   Not on file  Social History Narrative   Lives with her husband. Still works part time. She enjoys hiking, walking and gardening.     Social Determinants of Health   Financial Resource Strain: Low Risk  (07/06/2023)   Overall Financial Resource Strain (CARDIA)    Difficulty of Paying Living Expenses: Not hard at all  Food Insecurity: No Food Insecurity (07/06/2023)   Hunger Vital Sign    Worried About Running Out of Food in the Last Year: Never true    Ran Out of Food in the Last Year: Never true  Transportation Needs: No Transportation Needs (07/06/2023)   PRAPARE -  Administrator, Civil Service (Medical): No    Lack of Transportation (Non-Medical): No  Physical Activity: Sufficiently Active (07/06/2023)   Exercise Vital Sign    Days of Exercise per Week: 5 days    Minutes of Exercise per Session: 60 min  Stress: No Stress Concern Present (07/06/2023)   [employer] of Occupational Health - Occupational Stress Questionnaire    Feeling of Stress : Only a little  Social Connections: Socially Integrated (07/06/2023)   Social Connection and Isolation Panel [NHANES]    Frequency of Communication  with Friends and Family: More than three times a week    Frequency of Social Gatherings with Friends and Family: Twice a week    Attends Religious Services: More than 4 times per year    Active Member of Clubs or Organizations: Yes    Attends Engineer, structural: More than 4 times per year    Marital Status: Married    Family History  Problem Relation Age of Onset   Stroke Mother 26   Depression Mother    Anxiety disorder Mother    Bipolar disorder Mother    Obesity Mother    Colon cancer Father 30   Heart attack Father 66   Prostate cancer Father        dx 50's   Bladder Cancer Father 76   Hypertension Father    Hyperlipidemia Father    Heart disease Father    Obesity Father    Hyperlipidemia Sister    Asthma Brother    Hyperlipidemia Brother    Heart attack Maternal Grandmother 50   Heart disease Maternal Grandmother    Diabetes Maternal Grandfather    Kidney disease Maternal Grandfather    Cancer Paternal Grandmother 43       GYN cancer ( thinks ovarian, maybe uterine)   Anxiety disorder Daughter    Anxiety disorder Son    Breast cancer Other 39   Colon cancer Other 75   Cancer Other        type unk, age dx unk   Esophageal cancer Neg Hx    Stomach cancer Neg Hx    Rectal cancer Neg Hx     Health Maintenance  Topic Date Due   COVID-19 Vaccine (6 - 2023-24 season) 05/17/2023   Medicare Annual Wellness  (AWV)  11/24/2023   MAMMOGRAM  05/19/2024   DEXA SCAN  08/06/2024   Colonoscopy  11/21/2024   DTaP/Tdap/Td (3 - Td or Tdap) 03/24/2029   Pneumonia Vaccine 70+ Years old  Completed   INFLUENZA VACCINE  Completed   Hepatitis C Screening  Completed   Zoster Vaccines- Shingrix  Completed   HPV VACCINES  Aged Out     ----------------------------------------------------------------------------------------------------------------------------------------------------------------------------------------------------------------- Physical Exam BP 136/81 (BP Location: Left Arm, Patient Position: Sitting, Cuff Size: Normal)   Pulse 71   Ht 5' 2.75" (1.594 m)   Wt 189 lb (85.7 kg)   SpO2 98%   BMI 33.75 kg/m   Physical Exam Constitutional:      Appearance: Normal appearance.  Skin:    Comments: Slightly raised erythematous rash on the chest wall.  Neurological:     Mental Status: She is alert.     ------------------------------------------------------------------------------------------------------------------------------------------------------------------------------------------------------------------- Assessment and Plan  Rash ?If related to previous chemotherapy/immunotherapy for cancer.  She does also have hypothyroidism and rash related to hashimotos could be a contributor.  She has responded well to steroids in the past.  Adding 5 day burst of 40mg  prednisone and renewed triamcinolone cream.    Meds ordered this encounter  Medications   triamcinolone cream (KENALOG) 0.1 %    Sig: Apply 1 application topically 2 (two) times daily.    Dispense:  30 g    Refill:  1   predniSONE (DELTASONE) 20 MG tablet    Sig: Take 1 tablet (20 mg total) by mouth 2 (two) times daily with a meal for 5 days.    Dispense:  10 tablet    Refill:  0    No follow-ups on file.    This visit  occurred during the SARS-CoV-2 public health emergency.  Safety protocols were in place, including  screening questions prior to the visit, additional usage of staff PPE, and extensive cleaning of exam room while observing appropriate contact time as indicated for disinfecting solutions.

## 2023-08-22 ENCOUNTER — Other Ambulatory Visit (HOSPITAL_COMMUNITY): Payer: Self-pay

## 2023-08-22 ENCOUNTER — Encounter: Payer: Self-pay | Admitting: Adult Health

## 2023-08-26 ENCOUNTER — Other Ambulatory Visit (HOSPITAL_COMMUNITY): Payer: Self-pay

## 2023-09-21 NOTE — Assessment & Plan Note (Signed)
 Invasive ductal carcinoma, pT2N0M0, G2,  ER+/PR-/HER2+ -Diagnosed in 04/2017.  S/p right breast lumpectomy, adjuvant chemo and radiation. She tried Nerlynx  but could not tolerate due to diarrhea -She started anti-estrogen therapy with letrozole  in 12/2017. Due to joint pain she switched to Anastrozole  in 04/2018. Goal 7 years due to high risk disease.

## 2023-09-21 NOTE — Assessment & Plan Note (Signed)
 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 Treated with hysterectomy, BSO, adjuvant radiation, and chemo with Carbo/taxol . -genetics 04/2017 were negative with VUS in GATA2. -She had retroperitoneal lymph node metastasis in April 2019 and completed 2 years of Keytruda  in May 2021

## 2023-09-22 ENCOUNTER — Other Ambulatory Visit (HOSPITAL_COMMUNITY): Payer: Self-pay

## 2023-09-23 ENCOUNTER — Inpatient Hospital Stay: Payer: PPO | Admitting: Hematology

## 2023-09-23 ENCOUNTER — Other Ambulatory Visit (HOSPITAL_COMMUNITY): Payer: Self-pay

## 2023-09-23 ENCOUNTER — Encounter: Payer: Self-pay | Admitting: Hematology

## 2023-09-23 ENCOUNTER — Inpatient Hospital Stay: Payer: PPO | Attending: Hematology

## 2023-09-23 VITALS — BP 127/72 | HR 74 | Temp 97.1°F | Resp 15 | Wt 189.5 lb

## 2023-09-23 DIAGNOSIS — I10 Essential (primary) hypertension: Secondary | ICD-10-CM | POA: Insufficient documentation

## 2023-09-23 DIAGNOSIS — Z79899 Other long term (current) drug therapy: Secondary | ICD-10-CM | POA: Insufficient documentation

## 2023-09-23 DIAGNOSIS — Z1239 Encounter for other screening for malignant neoplasm of breast: Secondary | ICD-10-CM

## 2023-09-23 DIAGNOSIS — K7460 Unspecified cirrhosis of liver: Secondary | ICD-10-CM | POA: Diagnosis not present

## 2023-09-23 DIAGNOSIS — Z170 Estrogen receptor positive status [ER+]: Secondary | ICD-10-CM | POA: Diagnosis not present

## 2023-09-23 DIAGNOSIS — C541 Malignant neoplasm of endometrium: Secondary | ICD-10-CM

## 2023-09-23 DIAGNOSIS — Z79890 Hormone replacement therapy (postmenopausal): Secondary | ICD-10-CM | POA: Diagnosis not present

## 2023-09-23 DIAGNOSIS — Z800 Family history of malignant neoplasm of digestive organs: Secondary | ICD-10-CM | POA: Insufficient documentation

## 2023-09-23 DIAGNOSIS — Z8719 Personal history of other diseases of the digestive system: Secondary | ICD-10-CM | POA: Insufficient documentation

## 2023-09-23 DIAGNOSIS — K760 Fatty (change of) liver, not elsewhere classified: Secondary | ICD-10-CM | POA: Insufficient documentation

## 2023-09-23 DIAGNOSIS — Z79811 Long term (current) use of aromatase inhibitors: Secondary | ICD-10-CM | POA: Insufficient documentation

## 2023-09-23 DIAGNOSIS — C50211 Malignant neoplasm of upper-inner quadrant of right female breast: Secondary | ICD-10-CM | POA: Insufficient documentation

## 2023-09-23 DIAGNOSIS — M8580 Other specified disorders of bone density and structure, unspecified site: Secondary | ICD-10-CM | POA: Diagnosis not present

## 2023-09-23 DIAGNOSIS — Z923 Personal history of irradiation: Secondary | ICD-10-CM | POA: Insufficient documentation

## 2023-09-23 DIAGNOSIS — M25512 Pain in left shoulder: Secondary | ICD-10-CM | POA: Insufficient documentation

## 2023-09-23 DIAGNOSIS — E039 Hypothyroidism, unspecified: Secondary | ICD-10-CM | POA: Diagnosis not present

## 2023-09-23 LAB — CBC WITH DIFFERENTIAL (CANCER CENTER ONLY)
Abs Immature Granulocytes: 0.01 10*3/uL (ref 0.00–0.07)
Basophils Absolute: 0 10*3/uL (ref 0.0–0.1)
Basophils Relative: 1 %
Eosinophils Absolute: 0.1 10*3/uL (ref 0.0–0.5)
Eosinophils Relative: 2 %
HCT: 42.8 % (ref 36.0–46.0)
Hemoglobin: 15.2 g/dL — ABNORMAL HIGH (ref 12.0–15.0)
Immature Granulocytes: 0 %
Lymphocytes Relative: 36 %
Lymphs Abs: 2.1 10*3/uL (ref 0.7–4.0)
MCH: 33.9 pg (ref 26.0–34.0)
MCHC: 35.5 g/dL (ref 30.0–36.0)
MCV: 95.3 fL (ref 80.0–100.0)
Monocytes Absolute: 0.6 10*3/uL (ref 0.1–1.0)
Monocytes Relative: 9 %
Neutro Abs: 3.1 10*3/uL (ref 1.7–7.7)
Neutrophils Relative %: 52 %
Platelet Count: 201 10*3/uL (ref 150–400)
RBC: 4.49 MIL/uL (ref 3.87–5.11)
RDW: 12 % (ref 11.5–15.5)
WBC Count: 5.9 10*3/uL (ref 4.0–10.5)
nRBC: 0 % (ref 0.0–0.2)

## 2023-09-23 LAB — CMP (CANCER CENTER ONLY)
ALT: 36 U/L (ref 0–44)
AST: 27 U/L (ref 15–41)
Albumin: 4.5 g/dL (ref 3.5–5.0)
Alkaline Phosphatase: 43 U/L (ref 38–126)
Anion gap: 8 (ref 5–15)
BUN: 10 mg/dL (ref 8–23)
CO2: 29 mmol/L (ref 22–32)
Calcium: 9.7 mg/dL (ref 8.9–10.3)
Chloride: 101 mmol/L (ref 98–111)
Creatinine: 0.84 mg/dL (ref 0.44–1.00)
GFR, Estimated: >60 mL/min (ref >60)
Glucose, Bld: 145 mg/dL — ABNORMAL HIGH (ref 70–99)
Potassium: 3.6 mmol/L (ref 3.5–5.1)
Sodium: 138 mmol/L (ref 135–145)
Total Bilirubin: 0.7 mg/dL (ref 0.0–1.2)
Total Protein: 7.9 g/dL (ref 6.5–8.1)

## 2023-09-23 MED ORDER — ANASTROZOLE 1 MG PO TABS
1.0000 mg | ORAL_TABLET | Freq: Every day | ORAL | 3 refills | Status: AC
  Filled 2023-09-23 – 2023-11-01 (×2): qty 90, 90d supply, fill #0
  Filled 2024-01-25: qty 90, 90d supply, fill #1
  Filled 2024-03-26 – 2024-04-29 (×2): qty 90, 90d supply, fill #2
  Filled 2024-07-28: qty 90, 90d supply, fill #3

## 2023-09-23 NOTE — Progress Notes (Signed)
 [facility]   [phone] [phone]   Clinic Follow up Note   Patient Care Team: Alvan Dorothyann BIRCH, MD as PCP - General (Family Medicine) Mikell Katz, MD (Inactive) as Consulting Physician (General Surgery) Lanny Callander, MD as Consulting Physician (Hematology) Izell Domino, MD as Attending Physician (Radiation Oncology)  Date of Service:  09/23/2023  CHIEF COMPLAINT: f/u of breast cancer and endometrial cancer  CURRENT THERAPY:  Anastrozole   Oncology History   Malignant neoplasm of upper-inner quadrant of right breast in female, 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  S/p right breast lumpectomy, adjuvant chemo and radiation. She tried Nerlynx  but could not tolerate due to diarrhea -She started anti-estrogen therapy with letrozole  in 12/2017. Due to joint pain she switched to Anastrozole  in 04/2018. Goal 7 years due to high risk disease.   Endometrial cancer (HCC) pT1apN1M0, FIGO stage IIIc, MSI-H, probable RP node recurrence in 12/2017  -Diagnosed in 04/2017. Treated with hysterectomy, BSO, adjuvant radiation, and chemo with Carbo/taxol . -genetics 04/2017 were negative with VUS in GATA2. -She had retroperitoneal lymph node metastasis in April 2019 and completed 2 years of Keytruda  in May 2021    Assessment and Plan    Breast Cancer Follow-up for breast cancer diagnosed in 2018. Currently on letrozole  since April 2019. No new symptoms. Dimpling in the right breast likely due to scar tissue. Last mammogram in November showed dense breast tissue (C category). Discussed breast MRI due to dense tissue. Patient prefers MRI in May 2025. Insurance coverage to be confirmed. - Refill letrozole  prescription - Order breast MRI for May 2025 - Submit MRI request to insurance for approval  Endometrial Cancer Follow-up for endometrial cancer. No new symptoms. No additional CT scan  needed as it has been over five years since diagnosis. 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 Coils placed to prevent bleeding. No current bleeding. Discussed monitoring for signs of bleeding and need for follow-up endoscopy as recommended by GI specialist. - Monitor for signs of bleeding - Follow up with GI specialist   Liver Disease Liver disease with fatty liver noted on CT scan. Liver enzymes normal, indicating compensated liver function. Discussed relationship between fatty liver and varices, and importance of regular monitoring. - Monitor liver function - Follow up with GI specialist as needed  Hypertension Hypertension managed by primary care physician. - Continue management with primary care physician  Hypothyroidism Hypothyroidism managed with Synthroid . Last thyroid  function test in September 2023 was normal. Follow up with primary care physician for routine thyroid  function tests. Discussed need for regular monitoring and coordination with primary care physician. - Message primary care physician to follow up on thyroid  function tests - Discuss thyroid  function and medication management with primary care physician in March 2025  Shoulder Pain Left shoulder pain for a couple of months, possibly due to arthritis. No prior significant shoulder issues. Discussed possibility of orthopedic referral if pain persists or worsens. - Discuss shoulder pain with primary care physician in March 2025 - Consider orthopedic referral if pain persists or worsens   Plan -She is overall clinically doing well, no concern for cancer recurrence -Breast MRI with and without contrast for breast cancer screening in March  -Lab and follow-up in 1 year -Will copy her primary care physician, and let her PCP manage her hypothyroidism        SUMMARY OF ONCOLOGIC HISTORY: Oncology History Overview Note  MSI high  Cancer Staging Endometrial  cancer [facility]) Staging form: Corpus Uteri - Adenosarcoma, AJCC 8th Edition - Pathologic stage from 06/09/2017: Stage IIIC (pT1a, pN1, cM0) - Signed by Lanny Callander, MD on 06/16/2017  Malignant neoplasm of upper-inner quadrant of right breast in female, estrogen receptor positive (HCC) Staging form: Breast, AJCC 8th Edition - Clinical stage from 05/06/2017: Stage IA (cT1c, cN0, cM0, G2, ER: Positive, PR: Negative, HER2: Positive) - Unsigned - Pathologic stage from 05/25/2017: Stage IIA (pT2, pN0, cM0, G2, ER: Positive, PR: Negative, HER2: Negative) - Signed by Lanny Callander, MD on 06/16/2017     Malignant neoplasm of upper-inner quadrant of right breast in female, estrogen receptor positive (HCC)  04/27/2017 Initial Biopsy   Diagnosis Breast, right, needle core biopsy INVASIVE DUCTAL CARCINOMA, GRADE 2 Microscopic Comment The neoplasm has intracellular mucin and signet ring cell features. Immunostains shows these cells are positive for ER,GATA3, ck7 and GCDFP, negative for ck20, cdx2, TTF-1 and pax8, The immunostaining pattern supports the neoplasm is breast primary.   04/27/2017 Receptors her2   Estrogen Receptor: 70%, POSITIVE, MODERATE STAINING INTENSITY Progesterone Receptor: 0%, NEGATIVE Proliferation Marker Ki67: 30% HER2 - **POSITIVE** RATIO OF HER2/CEP17 SIGNALS 2.91 AVERAGE HER2 COPY NUMBER PER CELL 7.28   04/27/2017 Initial Diagnosis   Malignant neoplasm of upper-inner quadrant of right breast in female, estrogen receptor positive (HCC)   05/07/2017 Imaging   CT Chest W Contrast 05/07/17 IMPRESSION: Tiny well-defined fatty lesion on the pleura at the right lung base. The thinner slice collimation used for today's chest CT eliminates volume-averaging seen in the lesion on the prior exam and confirms that this is a diffusely fatty nodule. This is a benign finding and likely represents a tiny lipoma. No defect in the hemidiaphragm evident to suggest tiny diaphragmatic hernia. Pulmonary  hamartoma a consideration although the lack of soft tissue components makes this less likely.   05/15/2017 Genetic Testing   Patient had genetic testing due to a personal history of breast cancer and uterine cancer as well as a family history of cancer. The Multi-Cancer panel was ordered. The Multi-Cancer Panel offered by [facility] includes sequencing and/or deletion duplication testing of the following 83 genes: ALK, APC, ATM, AXIN2,BAP1,  BARD1, BLM, BMPR1A, BRCA1, BRCA2, BRIP1, CASR, CDC73, CDH1, CDK4, CDKN1B, CDKN1C, CDKN2A (p14ARF), CDKN2A (p16INK4a), CEBPA, CHEK2, CTNNA1, DICER1, DIS3L2, EGFR (c.2369C>T, p.Thr790Met variant only), EPCAM (Deletion/duplication testing only), FH, FLCN, GATA2, GPC3, GREM1 (Promoter region deletion/duplication testing only), HOXB13 (c.251G>A, p.Gly84Glu), HRAS, KIT, MAX, MEN1, MET, MITF (c.952G>A, p.Glu318Lys variant only), MLH1, MSH2, MSH3, MSH6, MUTYH, NBN, NF1, NF2, NTHL1, PALB2, PDGFRA, PHOX2B, PMS2, POLD1, POLE, POT1, PRKAR1A, PTCH1, PTEN, RAD50, RAD51C, RAD51D, RB1, RECQL4, RET, RUNX1, SDHAF2, SDHA (sequence changes only), SDHB, SDHC, SDHD, SMAD4, SMARCA4, SMARCB1, SMARCE1, STK11, SUFU, TERC, TERT, TMEM127, TP53, TSC1, TSC2, VHL, WRN and WT1.    Results: No pathogenic mutations were identified. A VUS in the GATA2 gene c.1348G>A (p.Gly450Arg) was identified.  The date of this test report is 05/25/2017.     05/25/2017 Surgery   RIGHT BREAST LUMPECTOMY WITH RADIOACTIVE SEED AND  RIGHT AXILLARY SENTINEL LYMPH NODE BIOPSY and Pot placement by Dr. Mikell 05/25/17   05/25/2017 Pathology Results   Diagnosis 05/25/17 1. Breast, lumpectomy, right - INVASIVE DUCTAL CARCINOMA, GRADE II/III, SPANNING 2.2 CM. - DUCTAL CARCINOMA IN SITU, HIGH GRADE. - THE SURGICAL RESECTION MARGINS ARE NEGATIVE FOR CARCINOMA. - SEE ONCOLOGY TABLE BELOW. 2. Breast, excision, right additional superior margin - BENIGN FIBROADIPOSE TISSUE. - BENIGN SKELETAL MUSCLE. - SEE COMMENT. 3. Breast,  excision, right additional lateral margin - BENIGN BREAST PARENCHYMA. - THERE IS NO EVIDENCE OF MALIGNANCY. - SEE COMMENT. 4. Breast, excision, chest wall margin - BENIGN FIBROADIPOSE TISSUE. - THERE IS NO EVIDENCE OF MALIGNANCY. - SEE COMMENT. 5. Lymph node, sentinel, biopsy, right axillary - THERE IS NO EVIDENCE OF CARCINOMA IN 1 OF 1 LYMPH NODE (0/1). 6. Lymph node, sentinel, biopsy, right axillary - THERE IS NO EVIDENCE OF CARCINOMA IN 1 OF 1 LYMPH NODE (0/1). 7. Lymph node, sentinel, biopsy, right axillary - THERE IS NO EVIDENCE OF CARCINOMA IN 1 OF 1 LYMPH NODE (0/1).   06/26/2017 PET scan   PET  IMPRESSION: 1. Postoperative findings both in the right breast and in the anatomic pelvis, with associated low-grade activity considered to be postoperative in nature. No hypermetabolic adenopathy or hypermetabolic lesions are identified to suggest active metastatic disease/malignancy. 2. Other imaging findings of potential clinical significance: Aortic Atherosclerosis (ICD10-I70.0). Sigmoid colon diverticulosis. Biapical pleuroparenchymal scarring in the lungs. Small amount of free pelvic fluid in the cul-de-sac, likely postoperative.     07/01/2017 - 10/14/2017 Chemotherapy   Adjuvant TCH with Onpro every 3 weeks for 6 cycles starting on 07/01/17, Changed Taxol  to abraxane  with cycle 4 due to drug rash reaction, followed by Herceptin  every 3 weeks for 6 months    11/01/2017 - 12/08/2017 Radiation Therapy   Radiation therapy to her right breast with Dr. Izell   11/04/2017 - 06/2018 Chemotherapy   Maintenance Herceptin  starting 11/04/17 and will comeplete her 12 months in 06/2018    12/23/2017 -  Anti-estrogen oral therapy   Adjuvant letrozole  2.5 mg daily started 12/23/17, switched to exemestane  on 04/21/18 due to joint pain. Could not afford aromasin , started anastrozole  04/2018.    12/30/2017 Imaging   CT CAP W Contrast 12/30/17 IMPRESSION: Increased size of 1.1 cm aorto-caval  retroperitoneal lymph node. This could be reactive due to interval hysterectomy, however metastatic carcinoma cannot be excluded. Consider continued attention on short-term follow-up CT, or PET-CT scan for further evaluation.   Mild hepatic steatosis.   01/25/2018 PET scan   IMPRESSION: 1. Enlarging hypermetabolic aortocaval lymph node is worrisome for metastatic disease. 2. Probable postoperative seroma in the medial right breast, with associated mild inflammatory hypermetabolism.   04/28/2018 Mammogram   Probably benign. Calcifications in the right breast most likely are dystrophic, related to previous surgery, and are probably benign.    04/28/2018 Imaging   DEXA Scan T Score -2.0   06/10/2018 Echocardiogram   06/10/2018 ECHO LV EF: 55% -   60%   08/28/2018 - 08/2018 Chemotherapy   Neratinib  4mg  for 1 week then titrate up with 1 additional tablet weekly up to 12 mg starting 08/28/18. Stopped soon after starting due to diarrhea.   10/25/2018 Imaging   CT CAP 10/25/18  IMPRESSION: 1. No evidence of metastatic disease. 2.  Aortic atherosclerosis (ICD10-170.0).   06/15/2019 Pathology Results   Diagnosis Breast, right, needle core biopsy, 1 o'clock, 7cmfn - DENSE FIBROSIS CONSISTENT WITH PRIOR PROCEDURAL CHANGES. - NO MALIGNANCY IDENTIFIED.   10/24/2019 Imaging   CT CAP W Contrast  IMPRESSION: 1. Stable exam. No evidence of recurrent or metastatic carcinoma within the chest, abdomen, or pelvis. 2. Colonic diverticulosis, without radiographic evidence of diverticulitis.   Aortic Atherosclerosis (ICD10-I70.0).   12/05/2020 Imaging   MRI Breast  IMPRESSION: No suspicious areas of enhancement identified within the right or left breast to suggest malignancy.   Endometrial adenocarcinoma (HCC)  05/07/2017 Initial Diagnosis   Endometrial adenocarcinoma (HCC)  06/09/2017 Surgery   XI ROBOTIC ASSISTED TOTAL LAPOROSCOPIC HYSTERECTOMY WITH BILATERAL SALPINGO OOPHORECTOMY and SENTINEL  NODE BIOPSY by Dr. Eloy 06/09/17   06/09/2017 Pathology Results   Diagnosis 06/09/17 1. Lymph node, sentinel, biopsy, right external iliac - METASTATIC ADENOCARCINOMA IN ONE LYMPH NODE (1/1). 2. Lymph node, sentinel, biopsy, left obturator - ONE BENIGN LYMPH NODE (0/1). 3. Lymph node, sentinel, biopsy, left external iliac - METASTATIC ADENOCARCINOMA IN ONE LYMPH NODE (1/1). 4. Uterus +/- tubes/ovaries, neoplastic ENDOMETRIUM: - ENDOMETRIAL ADENOCARCINOMA, 3.2 CM. - CARCINOMA INVADES INNER HALF OF MYOMETRIUM. 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 - CERVIX, BILATERAL FALLOPIAN TUBES AND BILATERAL OVARIES FREE OF TUMOR   08/18/2017 - 09/17/2017 Radiation Therapy   vaginal brachytherapy per Dr. 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   02/17/2018 - 02/08/2020 Antibody Plan   Keytruda  every 3 weeks starting 02/17/18. Completed 2 years on 02/08/20   04/11/2019 Imaging   CT AP W Contrast  IMPRESSION: 1. No evidence of recurrent or metastatic carcinoma within the abdomen or pelvis. 2. Colonic diverticulosis. No radiographic evidence of diverticulitis.     04/23/2020 Imaging   CT AP W contrast  IMPRESSION: 1. Stable exam. Specifically, no findings to suggest recurrent or metastatic disease. 2. Left colonic diverticulosis without diverticulitis. 3. Aortic Atherosclerosis (ICD10-I70.0).     10/22/2020 Imaging   CT CAP  IMPRESSION: 1. No evidence of metastatic disease in the chest, abdomen or pelvis. 2. Small hiatal hernia. 3. Mild sigmoid diverticulosis. 4. Aortic Atherosclerosis (ICD10-I70.0).        Discussed the use of AI scribe software for clinical note transcription with the patient, who gave verbal consent to proceed.  History of Present Illness   The patient, a 73 year old female with a history of colon cancer, endometrial cancer, and breast cancer,  presents for a routine follow-up. The patient reports a recent visit to a gastroenterologist for varices, which were managed with coil placement to prevent bleeding. The patient was informed that the varices were a sign of liver cirrhosis, likely related to her known fatty liver disease. However, the patient reports that her liver enzymes have been normal, indicating compensated liver function.  The patient also expresses concern about dimpling in the breast, which has been present for some time but appears to be more pronounced recently. The patient denies any associated pain. The patient also reports pain in the left shoulder, which has been present for a couple of months. The patient is able to move the shoulder and sleep without significant discomfort.  The patient is currently on anastrozole  for breast cancer, which was diagnosed in 2018. The patient is due to complete seven years of anastrozole  in April 2026. The patient also reports taking Synthroid  and vitamin D , and expresses concern that these have not been checked recently. The patient plans to discuss this with her primary care physician at her next visit.  The patient is still working part-time and enjoys traveling. The patient reports a recent trip to [place]  and plans to travel to [place], [place], and [place]. The patient also reports efforts to maintain physical activity, including weight lifting and walking.         All other systems were reviewed with the patient and are negative.  MEDICAL HISTORY:  Past Medical History:  Diagnosis Date   Allergy 2018   Anemia    as a child.   Arthritis  Atypical mole 06/04/2018   moderate top of right foot   Atypical mole 06/04/2018   left elbow mild   Atypical mole 06/04/2018   left upper back mild   Bilateral cataracts    Bilateral leg cramps    Cancer (HCC)    skin   Dyspnea    Endometrial cancer (HCC)    Family history of bladder cancer    Family history of colon cancer in  father    Fatty liver    Fuchs' corneal dystrophy    GERD (gastroesophageal reflux disease)    Hearing loss    Right side 30%   Heart murmur 1960   Heartburn    History of hiatal hernia    small size   History of radiation therapy 08/21/17, 08/28/17,09/01/17, 09/11/17, 09/17/17   Vaginal cuff brachytherapy.    History of radiation therapy 11/11/17- 12/08/17   Right Breast treated to 40.05 Gy with 15 fx of 2.67 Gy and a boost of 10 Gy with 5 fx.    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   Uterine cancer (HCC) 04/2017   endometrial cancer   Varices, gastric    Vertigo     SURGICAL HISTORY: Past Surgical History:  Procedure Laterality Date   ABDOMINAL HYSTERECTOMY  2018   BREAST BIOPSY Right 04/27/2017   BREAST LUMPECTOMY WITH RADIOACTIVE SEED AND SENTINEL LYMPH NODE BIOPSY Right 05/25/2017   Procedure: RIGHT BREAST LUMPECTOMY WITH RADIOACTIVE SEED AND  RIGHT AXILLARY SENTINEL LYMPH NODE BIOPSY;  Surgeon: Mikell Katz, MD;  Location: [facility];  Service: General;  Laterality: Right;   CATARACT EXTRACTION, BILATERAL     COLONOSCOPY     EYE SURGERY  2018   HYSTEROSCOPY WITH D & C N/A 04/29/2017   Procedure: DILATATION AND CURETTAGE /HYSTEROSCOPY;  Surgeon: Dannielle Bouchard, DO;  Location: [facility];  Service: Gynecology;  Laterality: N/A;   IR ANGIOGRAM SELECTIVE EACH ADDITIONAL VESSEL  02/16/2023   IR EMBO ART  VEN HEMORR LYMPH EXTRAV  INC GUIDE ROADMAPPING  02/16/2023   IR RADIOLOGIST EVAL & MGMT  01/27/2023   IR RADIOLOGIST EVAL & MGMT  03/18/2023   IR TRANSCATHETER BX  02/16/2023   IR US  GUIDE  VASC ACCESS RIGHT  02/16/2023   IR VENOGRAM RENAL UNI LEFT  02/16/2023   PORT-A-CATH REMOVAL Left 04/16/2021   Procedure: REMOVAL PORT-A-CATH;  Surgeon: Gladis Cough, MD;  Location: [facility];  Service: General;  Laterality: Left;   PORTACATH PLACEMENT Left 05/25/2017   Procedure: INSERTION PORT-A-CATH;  Surgeon: Mikell Katz, MD;  Location: [facility];  Service: General;  Laterality: Left;   ROBOTIC ASSISTED TOTAL HYSTERECTOMY WITH BILATERAL SALPINGO OOPHERECTOMY N/A 06/09/2017   Procedure: XI ROBOTIC ASSISTED TOTAL LAPOROSCOPIC HYSTERECTOMY WITH BILATERAL SALPINGO OOPHORECTOMY;  Surgeon: Eloy Herring, MD;  Location: [facility];  Service: Gynecology;  Laterality: N/A;   SENTINEL NODE BIOPSY N/A 06/09/2017   Procedure: SENTINEL NODE BIOPSY;  Surgeon: Eloy Herring, MD;  Location: [facility];  Service: Gynecology;  Laterality: N/A;  I have reviewed the social history and family history with the patient and they are unchanged from previous note.  ALLERGIES:  is allergic to ciprofloxacin , paclitaxel , crestor [rosuvastatin calcium ], fosamax  [alendronate  sodium], livalo  [pitavastatin ], and penicillins.  MEDICATIONS:  Current Outpatient Medications  Medication Sig Dispense Refill   acetaminophen  (TYLENOL ) 500 MG tablet Take 500 mg by mouth every 6 (six) hours as needed for moderate pain.     Alpha-Lipoic Acid 600 MG CAPS Take 1 capsule (600 mg total) by mouth daily. 90 capsule 3   anastrozole  (ARIMIDEX ) 1 MG tablet Take 1 tablet (1 mg total) by mouth daily. 90 tablet 3   Ascorbic Acid (VITAMIN C) 1000 MG tablet Take 1,000 mg by mouth daily.     atorvastatin  (LIPITOR) 20 MG tablet Take 1 tablet (20 mg total) by mouth daily. 90 tablet 1   b complex vitamins capsule Take 1 capsule by mouth every other day.     Cholecalciferol (VITAMIN D3) 75 MCG (3000 UT) TABS Take 1 capsule by mouth daily.     dicyclomine  (BENTYL ) 10 MG capsule Take 1 capsule (10 mg total) by mouth daily  as needed for spasms. 90 capsule 3   fluticasone  (FLONASE ) 50 MCG/ACT nasal spray Place 2 sprays into both nostrils daily. 48 g 3   levothyroxine  (SYNTHROID ) 50 MCG tablet TAKE 1 TABLET BY MOUTH  DAILY 90 tablet 3   Multiple Vitamin (MULTIVITAMIN) capsule Take 1 capsule by mouth daily.     olmesartan -hydrochlorothiazide  (BENICAR  HCT) 20-12.5 MG tablet Take 1 tablet by mouth daily. 90 tablet 3   pantoprazole  (PROTONIX ) 20 MG tablet Take 1 tablet (20 mg total) by mouth daily. 30 tablet 3   pyridOXINE (VITAMIN B-6) 50 MG tablet Take 1 tablet (50 mg total) by mouth daily. 90 tablet 3   triamcinolone  cream (KENALOG ) 0.1 % Apply 1 application topically 2 (two) times daily. 30 g 1   No current facility-administered medications for this visit.    PHYSICAL EXAMINATION: ECOG PERFORMANCE STATUS: 0 - Asymptomatic  Vitals:   09/23/23 0811  BP: 127/72  Pulse: 74  Resp: 15  Temp: (!) 97.1 F (36.2 C)  SpO2: 96%   Wt Readings from Last 3 Encounters:  09/23/23 189 lb 8 oz (86 kg)  08/12/23 189 lb (85.7 kg)  07/08/23 187 lb (84.8 kg)     GENERAL:alert, no distress and comfortable SKIN: skin color, texture, turgor are normal, no rashes or significant lesions EYES: normal, Conjunctiva are pink and non-injected, sclera clear NECK: supple, thyroid  normal size, non-tender, without nodularity LYMPH:  no palpable lymphadenopathy in the cervical, axillary  LUNGS: clear to auscultation and percussion with normal breathing effort HEART: regular rate & rhythm and no murmurs and no lower extremity edema ABDOMEN:abdomen soft, non-tender and normal bowel sounds Musculoskeletal:no cyanosis of digits and no clubbing  NEURO: alert & oriented x 3 with fluent speech, no focal motor/sensory deficits BREAST: Right breast with nodular scar tissue, dimpling, in upper outer quadrant above nipple.  Left breast has no palpable mass, no axillary adenopathy.  LABORATORY DATA:  I have reviewed the data as listed     Latest Ref Rng & Units 09/23/2023    8:00 AM 06/18/2023   11:30 AM 02/16/2023    8:44 AM  CBC  WBC 4.0 - 10.5 K/uL 5.9  5.4  4.8   Hemoglobin 12.0 - 15.0 g/dL 84.7  85.2  85.0   Hematocrit 36.0 - 46.0 % 42.8  43.4  42.5   Platelets 150 -  400 K/uL 201  201.0  184         Latest Ref Rng & Units 09/23/2023    8:00 AM 06/18/2023   11:30 AM 02/16/2023    8:44 AM  CMP  Glucose 70 - 99 mg/dL 854  891  868   BUN 8 - 23 mg/dL 10  12  10    Creatinine 0.44 - 1.00 mg/dL 9.15  9.09  9.21   Sodium 135 - 145 mmol/L 138  137  137   Potassium 3.5 - 5.1 mmol/L 3.6  3.5  3.6   Chloride 98 - 111 mmol/L 101  100  101   CO2 22 - 32 mmol/L 29  30  27    Calcium  8.9 - 10.3 mg/dL 9.7  9.8  9.3   Total Protein 6.5 - 8.1 g/dL 7.9  7.8  7.7   Total Bilirubin 0.0 - 1.2 mg/dL 0.7  0.6  0.9   Alkaline Phos 38 - 126 U/L 43  43  38   AST 15 - 41 U/L 27  24  30    ALT 0 - 44 U/L 36  35  35       RADIOGRAPHIC STUDIES: I have personally reviewed the radiological images as listed and agreed with the findings in the report. No results found.    Orders Placed This Encounter  Procedures   MR BREAST BILATERAL W WO CONTRAST INC CAD    Standing Status:   Future    Expected Date:   02/02/2024    Expiration Date:   09/22/2024    If indicated for the ordered procedure, I authorize the administration of contrast media per Radiology protocol:   Yes    What is the patient's sedation requirement?:   No Sedation    Does the patient have a pacemaker or implanted devices?:   No    Radiology Contrast Protocol - do NOT remove file path:   [email or web address]    Preferred imaging location?:   [facility] (table limit-550lbs)   All questions were answered. The patient knows to call the clinic with any problems, questions or concerns. No barriers to learning was detected. The total time spent in the appointment was 30 minutes.     Onita Mattock, MD 09/23/2023

## 2023-09-28 ENCOUNTER — Other Ambulatory Visit: Payer: Self-pay | Admitting: Family Medicine

## 2023-09-28 DIAGNOSIS — Z124 Encounter for screening for malignant neoplasm of cervix: Secondary | ICD-10-CM | POA: Diagnosis not present

## 2023-09-28 DIAGNOSIS — Z6833 Body mass index (BMI) 33.0-33.9, adult: Secondary | ICD-10-CM | POA: Diagnosis not present

## 2023-09-28 DIAGNOSIS — E7849 Other hyperlipidemia: Secondary | ICD-10-CM

## 2023-09-30 MED ORDER — ATORVASTATIN CALCIUM 20 MG PO TABS
20.0000 mg | ORAL_TABLET | Freq: Every day | ORAL | 1 refills | Status: DC
  Filled 2023-09-30: qty 90, 90d supply, fill #0
  Filled 2023-12-29: qty 90, 90d supply, fill #1

## 2023-10-01 ENCOUNTER — Other Ambulatory Visit (HOSPITAL_COMMUNITY): Payer: Self-pay

## 2023-10-02 ENCOUNTER — Other Ambulatory Visit (HOSPITAL_COMMUNITY): Payer: Self-pay

## 2023-10-08 ENCOUNTER — Encounter: Payer: Self-pay | Admitting: Gastroenterology

## 2023-10-12 ENCOUNTER — Other Ambulatory Visit: Payer: Self-pay | Admitting: Gastroenterology

## 2023-10-12 ENCOUNTER — Other Ambulatory Visit (HOSPITAL_COMMUNITY): Payer: Self-pay

## 2023-10-12 ENCOUNTER — Other Ambulatory Visit: Payer: Self-pay

## 2023-10-12 MED ORDER — PANTOPRAZOLE SODIUM 20 MG PO TBEC
20.0000 mg | DELAYED_RELEASE_TABLET | Freq: Every day | ORAL | 3 refills | Status: DC
  Filled 2023-10-12: qty 30, 30d supply, fill #0
  Filled 2023-12-07: qty 30, 30d supply, fill #1
  Filled 2024-01-02: qty 30, 30d supply, fill #2
  Filled 2024-02-01: qty 30, 30d supply, fill #3

## 2023-11-02 ENCOUNTER — Other Ambulatory Visit: Payer: Self-pay

## 2023-11-13 ENCOUNTER — Other Ambulatory Visit (HOSPITAL_BASED_OUTPATIENT_CLINIC_OR_DEPARTMENT_OTHER): Payer: Self-pay

## 2023-11-30 ENCOUNTER — Ambulatory Visit (INDEPENDENT_AMBULATORY_CARE_PROVIDER_SITE_OTHER): Payer: PPO

## 2023-11-30 VITALS — Ht 63.0 in | Wt 188.0 lb

## 2023-11-30 DIAGNOSIS — Z0000 Encounter for general adult medical examination without abnormal findings: Secondary | ICD-10-CM | POA: Diagnosis not present

## 2023-11-30 NOTE — Patient Instructions (Signed)
  Ms. Cressler , Thank you for taking time to come for your Medicare Wellness Visit. I appreciate your ongoing commitment to your health goals. Please review the following plan we discussed and let me know if I can assist you in the future.   These are the goals we discussed:  Goals       Patient Stated (pt-stated)      11/20/2021 AWV Goal: Improved Nutrition/Diet  Patient will verbalize understanding that diet plays an important role in overall health and that a poor diet is a risk factor for many chronic medical conditions.  Over the next year, patient will improve self management of their diet by incorporating better food choices. Patient will utilize available community resources to help with food acquisition if needed (ex: food pantries, [address], etc) Patient will work with nutrition specialist if a referral was made       Patient Stated (pt-stated)      Patient stated that she would like to loose 20 lbs.      Patient Stated she thought she was doing well at present. She did not think she needed program support at this time      Interventions Spoke with client about client needs Discussed program support with RN, Pharmacist and LCSW Discussed client support with PCP, Dr. Nani Gasser.. Encouraged client to talk with PCP about Care Coordination program support . Gave client LCSW name and phone number. Encouraged client to call LCSW at [phone]. as needed to learn more about program and program support      Weight (lb) < 172 lb (78 kg)      She would like to lose weight.         This is a list of the screening recommended for you and due dates:  Health Maintenance  Topic Date Due   COVID-19 Vaccine (6 - 2024-25 season) 05/17/2023   Mammogram  05/19/2024   DEXA scan (bone density measurement)  08/06/2024   Colon Cancer Screening  11/21/2024   Medicare Annual Wellness Visit  11/29/2024   DTaP/Tdap/Td vaccine (3 - Td or Tdap) 03/24/2029   Pneumonia Vaccine  Completed    Flu Shot  Completed   Hepatitis C Screening  Completed   Zoster (Shingles) Vaccine  Completed   HPV Vaccine  Aged Out

## 2023-11-30 NOTE — Progress Notes (Signed)
 Subjective:   Dawn [name] is a 73 y.o. female who presents for Medicare Annual (Subsequent) preventive examination.  Visit Complete: Virtual I connected with  [name] on 11/30/23 by a audio enabled telemedicine application and verified that I am speaking with the correct person using two identifiers.  Patient Location: Home  Provider Location: Office/Clinic  I discussed the limitations of evaluation and management by telemedicine. The patient expressed understanding and agreed to proceed.  Vital Signs: Because this visit was a virtual/telehealth visit, some criteria may be missing or patient reported. Any vitals not documented were not able to be obtained and vitals that have been documented are patient reported.  Patient Medicare AWV questionnaire was completed by the patient on n/a; I have confirmed that all information answered by patient is correct and no changes since this date.  Cardiac Risk Factors include: advanced age (>31men, >1 women);obesity (BMI >30kg/m2);hypertension;dyslipidemia;family history of premature cardiovascular disease     Objective:    Today's Vitals   11/30/23 1352  Weight: 188 lb (85.3 kg)  Height: 5\' 3"  (1.6 m)   Body mass index is 33.3 kg/m.     11/30/2023    2:04 PM 02/16/2023    8:26 AM 11/24/2022    2:20 PM 05/21/2022    8:51 AM 11/20/2021    1:58 PM 11/13/2021   11:15 AM 04/16/2021    1:04 PM  Advanced Directives  Does Patient Have a Medical Advance Directive? Yes Yes No Yes Yes Yes Yes  Type of Estate agent of [place];Living will Living will   Living will;Healthcare Power of Attorney  Living will  Does patient want to make changes to medical advance directive?  No - Patient declined   No - Patient declined No - Patient declined No - Patient declined  Copy of Healthcare Power of Attorney in Chart? Yes - validated most recent copy scanned in chart (See row information)   No - copy requested No - copy requested  No -  copy requested  Would patient like information on creating a medical advance directive?   No - Patient declined        Current Medications (verified) Outpatient Encounter Medications as of 11/30/2023  Medication Sig   acetaminophen (TYLENOL) 500 MG tablet Take 500 mg by mouth every 6 (six) hours as needed for moderate pain.   Alpha-Lipoic Acid 600 MG CAPS Take 1 capsule (600 mg total) by mouth daily.   anastrozole (ARIMIDEX) 1 MG tablet Take 1 tablet (1 mg total) by mouth daily.   Ascorbic Acid (VITAMIN C) 1000 MG tablet Take 1,000 mg by mouth daily.   atorvastatin (LIPITOR) 20 MG tablet Take 1 tablet (20 mg total) by mouth daily.   b complex vitamins capsule Take 1 capsule by mouth every other day.   Cholecalciferol (VITAMIN D3) 75 MCG (3000 UT) TABS Take 1 capsule by mouth daily.   fluticasone (FLONASE) 50 MCG/ACT nasal spray Place 2 sprays into both nostrils daily.   levothyroxine (SYNTHROID) 50 MCG tablet TAKE 1 TABLET BY MOUTH  DAILY   Multiple Vitamin (MULTIVITAMIN) capsule Take 1 capsule by mouth daily.   olmesartan-hydrochlorothiazide (BENICAR HCT) 20-12.5 MG tablet Take 1 tablet by mouth daily.   pantoprazole (PROTONIX) 20 MG tablet Take 1 tablet (20 mg total) by mouth daily.   pyridOXINE (VITAMIN B-6) 50 MG tablet Take 1 tablet (50 mg total) by mouth daily.   triamcinolone cream (KENALOG) 0.1 % Apply 1 application topically 2 (two) times  daily.   dicyclomine (BENTYL) 10 MG capsule Take 1 capsule (10 mg total) by mouth daily as needed for spasms. (Patient not taking: Reported on 11/30/2023)   No facility-administered encounter medications on file as of 11/30/2023.    Allergies (verified) Ciprofloxacin, Paclitaxel, Crestor [rosuvastatin calcium], Fosamax [alendronate sodium], Livalo [pitavastatin], and Penicillins   History: Past Medical History:  Diagnosis Date   Allergy 2018   Anemia    as a child.   Arthritis    Atypical mole 06/04/2018   moderate top of right foot    Atypical mole 06/04/2018   left elbow mild   Atypical mole 06/04/2018   left upper back mild   Bilateral cataracts    Bilateral leg cramps    Cancer (HCC)    skin   Dyspnea    Endometrial cancer (HCC)    Family history of bladder cancer    Family history of colon cancer in father    Fatty liver    Fuchs' corneal dystrophy    GERD (gastroesophageal reflux disease)    Hearing loss    Right side 30%   Heart murmur 1960   Heartburn    History of hiatal hernia    small size   History of radiation therapy 08/21/17, 08/28/17,09/01/17, 09/11/17, 09/17/17   Vaginal cuff brachytherapy.    History of radiation therapy 11/11/17- 12/08/17   Right Breast treated to 40.05 Gy with 15 fx of 2.67 Gy and a boost of 10 Gy with 5 fx.    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   Uterine cancer (HCC) 04/2017   endometrial cancer   Varices, gastric    Vertigo    Past Surgical History:  Procedure Laterality Date   ABDOMINAL HYSTERECTOMY  2018   BREAST BIOPSY Right 04/27/2017   BREAST LUMPECTOMY WITH RADIOACTIVE SEED AND SENTINEL LYMPH NODE BIOPSY Right 05/25/2017   Procedure: RIGHT BREAST LUMPECTOMY WITH RADIOACTIVE SEED AND  RIGHT AXILLARY SENTINEL LYMPH NODE BIOPSY;  Surgeon: Glenna Fellows, MD;  Location: [facility];  Service: General;  Laterality: Right;   CATARACT EXTRACTION, BILATERAL     COLONOSCOPY     EYE SURGERY  2018   HYSTEROSCOPY WITH D & C N/A 04/29/2017   Procedure: DILATATION AND CURETTAGE /HYSTEROSCOPY;  Surgeon: Mitchel Honour, DO;  Location: [facility];  Service: Gynecology;  Laterality:  N/A;   IR ANGIOGRAM SELECTIVE EACH ADDITIONAL VESSEL  02/16/2023   IR EMBO ART  VEN HEMORR LYMPH EXTRAV  INC GUIDE ROADMAPPING  02/16/2023   IR RADIOLOGIST EVAL & MGMT  01/27/2023   IR RADIOLOGIST EVAL & MGMT  03/18/2023   IR TRANSCATHETER BX  02/16/2023   IR US GUIDE VASC ACCESS RIGHT  02/16/2023   IR VENOGRAM RENAL UNI LEFT  02/16/2023   PORT-A-CATH REMOVAL Left 04/16/2021   Procedure: REMOVAL PORT-A-CATH;  Surgeon: Luretha Murphy, MD;  Location: [facility];  Service: General;  Laterality: Left;   PORTACATH PLACEMENT Left 05/25/2017   Procedure: INSERTION PORT-A-CATH;  Surgeon: Glenna Fellows, MD;  Location: [facility];  Service: General;  Laterality: Left;   ROBOTIC ASSISTED TOTAL HYSTERECTOMY WITH BILATERAL SALPINGO OOPHERECTOMY N/A 06/09/2017   Procedure: XI ROBOTIC ASSISTED TOTAL LAPOROSCOPIC HYSTERECTOMY WITH BILATERAL SALPINGO OOPHORECTOMY;  Surgeon: Adolphus Birchwood, MD;  Location: [facility];  Service: Gynecology;  Laterality: N/A;   SENTINEL NODE BIOPSY N/A 06/09/2017   Procedure: SENTINEL NODE BIOPSY;  Surgeon: Adolphus Birchwood, MD;  Location: [facility];  Service: Gynecology;  Laterality: N/A;   Family History  Problem Relation Age of Onset   Stroke Mother 73   Depression Mother    Anxiety disorder Mother    Bipolar disorder Mother    Obesity Mother    Colon cancer Father 54   Heart attack Father 93   Prostate cancer Father        dx 62's   Bladder Cancer Father 59   Hypertension Father    Hyperlipidemia Father    Heart disease Father    Obesity Father    Hyperlipidemia Sister    Asthma Brother    Hyperlipidemia Brother    Heart attack Maternal Grandmother 69   Heart disease Maternal Grandmother    Diabetes Maternal Grandfather    Kidney disease Maternal Grandfather    Cancer Paternal Grandmother 22       GYN cancer ( thinks ovarian, maybe uterine)   Anxiety disorder Daughter    Anxiety disorder Son    Breast cancer Other 79   Colon cancer Other 21    Cancer Other        type unk, age dx unk   Esophageal cancer Neg Hx    Stomach cancer Neg Hx    Rectal cancer Neg Hx    Social History   Socioeconomic History   Marital status: Married    Spouse name: Maurine Minister   Number of children: 2   Years of education: 16   Highest education level: Bachelor's degree (e.g., BA, AB, BS)  Occupational History    Employer: [employer]    Comment:  Cards research   Tobacco Use   Smoking status: Never   Smokeless tobacco: Never  Vaping Use   Vaping status: Never Used  Substance and Sexual Activity   Alcohol use: Not Currently    Comment: <1/week wine or beer occasional   Drug use: No   Sexual activity: Yes    Birth control/protection: Post-menopausal  Other Topics Concern   Not on file  Social History Narrative   Lives with her husband. Still works part time. She enjoys hiking, walking and gardening.     Social Drivers of Corporate investment banker Strain: Low Risk  (11/30/2023)   Overall Financial Resource Strain (CARDIA)    Difficulty of Paying Living Expenses: Not hard at all  Food Insecurity: No Food Insecurity (11/30/2023)   Hunger Vital Sign    Worried About Running Out of Food in the Last Year: Never true    Ran Out of Food in the Last Year: Never true  Transportation Needs: No Transportation Needs (11/30/2023)   PRAPARE - Administrator, Civil Service (Medical): No    Lack of Transportation (Non-Medical): No  Physical Activity: Sufficiently Active (11/30/2023)   Exercise Vital Sign    Days of Exercise per Week: 5 days    Minutes of Exercise per Session: 60 min  Stress: No Stress Concern Present (11/30/2023)   [employer] of Occupational Health - Occupational Stress Questionnaire    Feeling of Stress : Not at  all  Social Connections: Socially Integrated (11/30/2023)   Social Connection and Isolation Panel [NHANES]    Frequency of Communication with Friends and Family: More than three times a  week    Frequency of Social Gatherings with Friends and Family: Once a week    Attends Religious Services: More than 4 times per year    Active Member of [employer] or Organizations: Yes    Attends Engineer, structural: More than 4 times per year    Marital Status: Married    Tobacco Counseling Counseling given: Not Answered   Clinical Intake:  Pre-visit preparation completed: Yes  Pain : No/denies pain     BMI - recorded: 33.3 Nutritional Status: BMI > 30  Obese Nutritional Risks: None Diabetes: No  How often do you need to have someone help you when you read instructions, pamphlets, or other written materials from your doctor or pharmacy?: 1 - Never What is the last grade level you completed in school?: 16  Interpreter Needed?: No      Activities of Daily Living    11/30/2023    1:53 PM 02/16/2023    8:24 AM  In your present state of health, do you have any difficulty performing the following activities:  Hearing? 1 0  Comment hearing aid   Vision? 0 0  Difficulty concentrating or making decisions? 0 0  Walking or climbing stairs? 0 0  Dressing or bathing? 0 0  Doing errands, shopping? 0   Preparing Food and eating ? N   Using the Toilet? N   In the past six months, have you accidently leaked urine? N   Do you have problems with loss of bowel control? N   Managing your Medications? N   Managing your Finances? N   Housekeeping or managing your Housekeeping? N     Patient Care Team: Agapito Games, MD as PCP - General (Family Medicine) Glenna Fellows, MD (Inactive) as Consulting Physician (General Surgery) Malachy Mood, MD as Consulting Physician (Hematology) Lonie Peak, MD as Attending Physician (Radiation Oncology)  Indicate any recent Medical Services you may have received from other than [facility] providers in the past year (date may be approximate).     Assessment:   This is a routine wellness examination for Corley.  Hearing/Vision  screen No results found.   Goals Addressed             This Visit's Progress    Weight (lb) < 172 lb (78 kg)   188 lb (85.3 kg)    She would like to lose weight.       Depression Screen    11/30/2023    2:03 PM 01/05/2023    9:16 AM 11/24/2022    2:18 PM 11/19/2022   11:04 AM 07/02/2022    9:13 AM 12/30/2021    8:34 AM 11/20/2021    1:55 PM  PHQ 2/9 Scores  PHQ - 2 Score 0 0 1 0 0 0 0    Fall Risk    11/30/2023    2:06 PM 01/05/2023    9:16 AM 11/24/2022    2:17 PM 11/20/2022    7:01 PM 11/19/2022   11:04 AM  Fall Risk   Falls in the past year? 0 0 0 0 0  Number falls in past yr: 0 0 0 0 0  Injury with Fall? 0 0 0 0 0  Risk for fall due to : No Fall Risks No Fall Risks No Fall Risks  No  Fall Risks  Follow up Falls evaluation completed Falls evaluation completed Falls evaluation completed  Falls evaluation completed    MEDICARE RISK AT HOME: Medicare Risk at Home Any stairs in or around the home?: No Home free of loose throw rugs in walkways, pet beds, electrical cords, etc?: Yes Adequate lighting in your home to reduce risk of falls?: Yes Life alert?: No Use of a cane, walker or w/c?: No Grab bars in the bathroom?: Yes Shower chair or bench in shower?: Yes Elevated toilet seat or a handicapped toilet?: Yes  TIMED UP AND GO:  Was the test performed?  No    Cognitive Function:        11/30/2023    2:08 PM 11/24/2022    2:22 PM 11/20/2021    2:00 PM 06/08/2020    2:26 PM  6CIT Screen  What Year? 0 points 0 points 0 points 0 points  What month? 0 points 0 points 0 points 0 points  What time? 0 points 0 points 0 points 0 points  Count back from 20 0 points 0 points 0 points 0 points  Months in reverse 0 points 0 points 0 points 0 points  Repeat phrase 0 points 0 points 0 points 2 points  Total Score 0 points 0 points 0 points 2 points    Immunizations Immunization History  Administered Date(s) Administered   Fluad Quad(high Dose 65+) 06/15/2019, 06/20/2020,  07/08/2021, 06/04/2022   Influenza Split 06/15/2012   Influenza Whole 06/26/2009, 05/18/2013   Influenza, Seasonal, Injecte, Preservative Fre 06/18/2023   Influenza,inj,Quad PF,6+ Mos 06/02/2014, 06/04/2016, 06/02/2018   Influenza-Unspecified 06/15/2017   PFIZER(Purple Top)SARS-COV-2 Vaccination 09/20/2019, 10/11/2019, 06/12/2020   PNEUMOCOCCAL CONJUGATE-20 07/31/2021   Pfizer Covid-19 Vaccine Bivalent Booster 48yrs & up 06/12/2021   Pfizer(Comirnaty)Fall Seasonal Vaccine 12 years and older 07/02/2022   Pneumococcal Conjugate-13 06/29/2017   Respiratory Syncytial Virus Vaccine,Recomb Aduvanted(Arexvy) 09/19/2022   Td 12/14/2008   Tdap 03/25/2019   Zoster Recombinant(Shingrix) 02/07/2022, 04/11/2022   Zoster, Live 04/04/2011    TDAP status: Up to date  Flu Vaccine status: Up to date  Pneumococcal vaccine status: Up to date  Covid-19 vaccine status: Declined, Education has been provided regarding the importance of this vaccine but patient still declined. Advised may receive this vaccine at local pharmacy or Health Dept.or vaccine clinic. Aware to provide a copy of the vaccination record if obtained from local pharmacy or Health Dept. Verbalized acceptance and understanding.  Qualifies for Shingles Vaccine? Yes   Zostavax completed Yes   Shingrix Completed?: Yes  Screening Tests Health Maintenance  Topic Date Due   COVID-19 Vaccine (6 - 2024-25 season) 05/17/2023   MAMMOGRAM  05/19/2024   DEXA SCAN  08/06/2024   Colonoscopy  11/21/2024   Medicare Annual Wellness (AWV)  11/29/2024   DTaP/Tdap/Td (3 - Td or Tdap) 03/24/2029   Pneumonia Vaccine 42+ Years old  Completed   INFLUENZA VACCINE  Completed   Hepatitis C Screening  Completed   Zoster Vaccines- Shingrix  Completed   HPV VACCINES  Aged Out    Health Maintenance  Health Maintenance Due  Topic Date Due   COVID-19 Vaccine (6 - 2024-25 season) 05/17/2023    Colorectal cancer screening: Type of screening: Colonoscopy.  Completed 11/22/2019. Repeat every 5 years  Mammogram status: Completed 05/20/2023. Repeat every year  Bone Density status: Completed 08/06/2022. Results reflect: Bone density results: OSTEOPENIA. Repeat every 2 years.  Lung Cancer Screening: (Low Dose CT Chest recommended if Age 57-80 years, 20 pack-year currently  smoking OR have quit w/in 15years.) does not qualify.   Lung Cancer Screening Referral: n/a  Additional Screening:  Hepatitis C Screening: does qualify; Completed 07/01/2017  Vision Screening: Recommended annual ophthalmology exams for early detection of glaucoma and other disorders of the eye. Is the patient up to date with their annual eye exam?  Yes  Who is the provider or what is the name of the office in which the patient attends annual eye exams? Dr Randon Goldsmith  If pt is not established with a provider, would they like to be referred to a provider to establish care?  N/a .   Dental Screening: Recommended annual dental exams for proper oral hygiene   Community Resource Referral / Chronic Care Management: CRR required this visit?  No   CCM required this visit?  No     Plan:     I have personally reviewed and noted the following in the patient's chart:   Medical and social history Use of alcohol, tobacco or illicit drugs  Current medications and supplements including opioid prescriptions. Patient is not currently taking opioid prescriptions. Functional ability and status Nutritional status Physical activity Advanced directives List of other physicians Hospitalizations, surgeries, and ER visits in previous 12 months Vitals Screenings to include cognitive, depression, and falls Referrals and appointments  In addition, I have reviewed and discussed with patient certain preventive protocols, quality metrics, and best practice recommendations. A written personalized care plan for preventive services as well as general preventive health recommendations were provided to  patient.     Esmond Harps, CMA   11/30/2023   After Visit Summary: (MyChart) Due to this being a telephonic visit, the after visit summary with patients personalized plan was offered to patient via MyChart   Nurse Notes:   ESTEL TONELLI is a 73 y.o. female patient of Metheney, Barbarann Ehlers, MD who had a Medicare Annual Wellness Visit today via telephone. Mishika is still working part time and lives with their spouse. She has 2 children. She reports that she is socially active and does interact with friends/family regularly. She is moderately physically active and enjoys  hiking, walking and gardening.

## 2023-12-07 ENCOUNTER — Other Ambulatory Visit: Payer: Self-pay

## 2023-12-07 ENCOUNTER — Other Ambulatory Visit (HOSPITAL_COMMUNITY): Payer: Self-pay

## 2023-12-07 ENCOUNTER — Other Ambulatory Visit: Payer: Self-pay | Admitting: Family Medicine

## 2023-12-07 DIAGNOSIS — T7840XD Allergy, unspecified, subsequent encounter: Secondary | ICD-10-CM

## 2023-12-07 MED ORDER — FLUTICASONE PROPIONATE 50 MCG/ACT NA SUSP
2.0000 | Freq: Every day | NASAL | 3 refills | Status: AC
  Filled 2023-12-07: qty 48, 90d supply, fill #0
  Filled 2024-03-26: qty 48, 90d supply, fill #1
  Filled 2024-06-21: qty 48, 90d supply, fill #2
  Filled 2024-09-19: qty 48, 90d supply, fill #3

## 2023-12-22 ENCOUNTER — Ambulatory Visit: Payer: Self-pay

## 2023-12-22 ENCOUNTER — Ambulatory Visit (INDEPENDENT_AMBULATORY_CARE_PROVIDER_SITE_OTHER): Admitting: Family Medicine

## 2023-12-22 ENCOUNTER — Encounter: Payer: Self-pay | Admitting: Adult Health

## 2023-12-22 ENCOUNTER — Other Ambulatory Visit (HOSPITAL_BASED_OUTPATIENT_CLINIC_OR_DEPARTMENT_OTHER): Payer: Self-pay

## 2023-12-22 VITALS — BP 134/76 | HR 74 | Ht 63.0 in | Wt 190.0 lb

## 2023-12-22 DIAGNOSIS — J019 Acute sinusitis, unspecified: Secondary | ICD-10-CM | POA: Diagnosis not present

## 2023-12-22 DIAGNOSIS — I10 Essential (primary) hypertension: Secondary | ICD-10-CM

## 2023-12-22 DIAGNOSIS — R7301 Impaired fasting glucose: Secondary | ICD-10-CM

## 2023-12-22 DIAGNOSIS — R051 Acute cough: Secondary | ICD-10-CM

## 2023-12-22 DIAGNOSIS — E039 Hypothyroidism, unspecified: Secondary | ICD-10-CM

## 2023-12-22 LAB — POCT GLYCOSYLATED HEMOGLOBIN (HGB A1C): Hemoglobin A1C: 5.8 % — AB (ref 4.0–5.6)

## 2023-12-22 MED ORDER — HYDROCODONE BIT-HOMATROP MBR 5-1.5 MG/5ML PO SOLN
5.0000 mL | Freq: Every evening | ORAL | 0 refills | Status: DC | PRN
  Filled 2023-12-22: qty 60, 12d supply, fill #0

## 2023-12-22 MED ORDER — CEFDINIR 300 MG PO CAPS
300.0000 mg | ORAL_CAPSULE | Freq: Two times a day (BID) | ORAL | 0 refills | Status: DC
  Filled 2023-12-22: qty 14, 7d supply, fill #0

## 2023-12-22 NOTE — Assessment & Plan Note (Signed)
 Recheck TSH today.

## 2023-12-22 NOTE — Telephone Encounter (Signed)
 Copied from CRM [id number]. Topic: Clinical - Red Word Triage >> Dec 22, 2023  8:12 AM Maxwell Marion wrote: Red Word that prompted transfer to Nurse Triage: patient has increased cough, says it is started to sound bronchitis like. Non-productive. Also has a headache. Going on week 3 with these symptoms.   Chief Complaint: cough Symptoms: sinus pain, lingering cough with coughing fits, prior green nasal discharge, "cough sounds wheezing," chest congestion, lightheaded at times with coughing fits Frequency: continual Pertinent Negatives: Patient denies SOB, current fever, current green nasal discharge, chest pain, blurred vision Disposition: [] 911 / [] ED /[] Urgent Care (no appt availability in office) / [x] Appointment(In office/virtual)/ []  [facility]/ [] Home Care/ [] Refused Recommended Disposition /[] [facility]/ []  Follow-up with PCP Additional Notes: Pt reporting cough going on for 3 weeks, recent URI 2 weeks ago with green nasal discharge no longer present, pt currently experiencing coughing fits, cough sounds more wet now, having sinus pain, lightheaded at times with coughing. Pt confirms no chest pain, SOB, current fever. Advised appt today, scheduled appt with PCP this afternoon, advised call back if worsening. Pt verbalized understanding.  Reason for Disposition  [1] Continuous (nonstop) coughing interferes with work or school AND [2] no improvement using cough treatment per Care Advice  Answer Assessment - Initial Assessment Questions 1. ONSET: "When did the cough begin?"      3 weeks, was sick 2 weeks ago, lot of green stuff out of nose, got better after about a week but cough lingering, headache sinuses 2. SEVERITY: "How bad is the cough today?"      Sounds croupy now, sounds more wet now, was more dry 3. SPUTUM: "Describe the color of your sputum" (none, dry cough; clear, white, yellow, green)     Not coughing anything up yet 4. HEMOPTYSIS: "Are you coughing up any  blood?" If so ask: "How much?" (flecks, streaks, tablespoons, etc.)     denies 5. DIFFICULTY BREATHING: "Are you having difficulty breathing?" If Yes, ask: "How bad is it?" (e.g., mild, moderate, severe)    - MILD: No SOB at rest, mild SOB with walking, speaks normally in sentences, can lie down, no retractions, pulse < 100.    - MODERATE: SOB at rest, SOB with minimal exertion and prefers to sit, cannot lie down flat, speaks in phrases, mild retractions, audible wheezing, pulse 100-120.    - SEVERE: Very SOB at rest, speaks in single words, struggling to breathe, sitting hunched forward, retractions, pulse > 120      No not really 6. FEVER: "Do you have a fever?" If Yes, ask: "What is your temperature, how was it measured, and when did it start?"     Think had one couple weeks ago but don't think I do now 7. CARDIAC HISTORY: "Do you have any history of heart disease?" (e.g., heart attack, congestive heart failure)      BP and cholesterol, not really taken BP but think it's okay 8. LUNG HISTORY: "Do you have any history of lung disease?"  (e.g., pulmonary embolus, asthma, emphysema)     denies 9. PE RISK FACTORS: "Do you have a history of blood clots?" (or: recent major surgery, recent prolonged travel, bedridden)     denies  Protocols used: Cough - Acute Non-Productive-A-AH

## 2023-12-22 NOTE — Assessment & Plan Note (Signed)
 Well controlled. Continue current regimen. Follow up in  6 mo

## 2023-12-22 NOTE — Progress Notes (Signed)
 Acute Office Visit  Subjective:     Patient ID: Dawn [name], female    DOB: [birth year], 73 y.o.   MRN: [id number]  Chief Complaint  Patient presents with   Cough   Sinusitis    HPI Patient is in today for cough and sinus pain x 3 weeks. Cough is worse at night.  She says the first week it started with a sore throat and she had symptoms for about a week then the second week she actually started to feel little bit better enough to go back into work but then this week she feels like she feels worse she is tired she is coughing more.  The cough is keeping her up at night she feels itchy all over her skin.  Though she has had that before ever since she had immunotherapy done.  She has had a lot of facial pressure headache and tooth pain.  She said she had some green mucus she thinks she may have run a fever when it first started a couple of weeks ago.  Impaired fasting glucose-no increased thirst or urination. No symptoms consistent with hypoglycemia.   Hypothyroidism - Taking medication regularly in the AM away from food and vitamins, etc. No recent change to skin, hair, or energy levels.   ROS      Objective:    BP 134/76   Pulse 74   Ht 5\' 3"  (1.6 m)   Wt 190 lb (86.2 kg)   SpO2 97%   BMI 33.66 kg/m    Physical Exam Constitutional:      Appearance: Normal appearance.  HENT:     Head: Normocephalic and atraumatic.     Right Ear: Tympanic membrane, ear canal and external ear normal. There is no impacted cerumen.     Left Ear: Tympanic membrane, ear canal and external ear normal. There is no impacted cerumen.     Nose: Nose normal.     Mouth/Throat:     Pharynx: Oropharynx is clear.  Eyes:     Conjunctiva/sclera: Conjunctivae normal.  Cardiovascular:     Rate and Rhythm: Normal rate and regular rhythm.  Pulmonary:     Effort: Pulmonary effort is normal.     Breath sounds: Normal breath sounds.  Musculoskeletal:     Cervical back: Neck supple. No tenderness.   Lymphadenopathy:     Cervical: No cervical adenopathy.  Skin:    General: Skin is warm and dry.  Neurological:     Mental Status: She is alert and oriented to person, place, and time.  Psychiatric:        Mood and Affect: Mood normal.     Results for orders placed or performed in visit on 12/22/23  POCT HgB A1C  Result Value Ref Range   Hemoglobin A1C 5.8 (A) 4.0 - 5.6 %   HbA1c POC (<> result, manual entry)     HbA1c, POC (prediabetic range)     HbA1c, POC (controlled diabetic range)          Assessment & Plan:   Problem List Items Addressed This Visit       Cardiovascular and Mediastinum   HYPERTENSION, BENIGN   Well controlled. Continue current regimen. Follow up in  43mo       Relevant Orders   TSH + free T4   VITAMIN D 25 Hydroxy (Vit-D Deficiency, Fractures)     Endocrine   IFG (impaired fasting glucose) - Primary   1C is stable at 5.8 today  continue to work on healthy diet regular exercise and plan to recheck again in 6 months.      Relevant Orders   POCT HgB A1C (Completed)   TSH + free T4   VITAMIN D 25 Hydroxy (Vit-D Deficiency, Fractures)   Hypothyroid   Recheck TSH today.       Relevant Orders   TSH   TSH + free T4   VITAMIN D 25 Hydroxy (Vit-D Deficiency, Fractures)   Other Visit Diagnoses       Acute non-recurrent sinusitis, unspecified location       Relevant Medications   cefdinir (OMNICEF) 300 MG capsule   HYDROcodone bit-homatropine (HYCODAN) 5-1.5 MG/5ML syrup     Acute cough          Sinus-will treat with Omnicef since she has a penicillin allergy.  Did prescribe a small amount of cough syrup to use at bedtime since the cough is keeping her awake.  Call if not feeling significantly better in the next week.  Meds ordered this encounter  Medications   cefdinir (OMNICEF) 300 MG capsule    Sig: Take 1 capsule (300 mg total) by mouth 2 (two) times daily.    Dispense:  14 capsule    Refill:  0   HYDROcodone bit-homatropine  (HYCODAN) 5-1.5 MG/5ML syrup    Sig: Take 5 mLs by mouth at bedtime as needed for cough.    Dispense:  60 mL    Refill:  0    Return in about 6 months (around 06/22/2024) for thyroid check up .  Nani Gasser, MD

## 2023-12-22 NOTE — Telephone Encounter (Signed)
Patient scheduled today with Dr Linford Arnold

## 2023-12-22 NOTE — Assessment & Plan Note (Signed)
 1C is stable at 5.8 today continue to work on healthy diet regular exercise and plan to recheck again in 6 months.

## 2023-12-23 ENCOUNTER — Encounter: Payer: Self-pay | Admitting: Family Medicine

## 2023-12-23 LAB — TSH+FREE T4
Free T4: 1.12 ng/dL (ref 0.82–1.77)
TSH: 2.85 u[IU]/mL (ref 0.450–4.500)

## 2023-12-23 LAB — VITAMIN D 25 HYDROXY (VIT D DEFICIENCY, FRACTURES): Vit D, 25-Hydroxy: 57.5 ng/mL (ref 30.0–100.0)

## 2023-12-23 NOTE — Progress Notes (Signed)
 Your lab work is within acceptable range and there are no concerning findings.   ?

## 2024-01-11 ENCOUNTER — Ambulatory Visit: Payer: PPO | Admitting: Family Medicine

## 2024-01-24 ENCOUNTER — Other Ambulatory Visit: Payer: Self-pay | Admitting: Family Medicine

## 2024-01-24 DIAGNOSIS — E039 Hypothyroidism, unspecified: Secondary | ICD-10-CM

## 2024-01-25 ENCOUNTER — Other Ambulatory Visit (HOSPITAL_COMMUNITY): Payer: Self-pay

## 2024-01-25 ENCOUNTER — Other Ambulatory Visit (HOSPITAL_BASED_OUTPATIENT_CLINIC_OR_DEPARTMENT_OTHER): Payer: Self-pay

## 2024-01-25 MED ORDER — LEVOTHYROXINE SODIUM 50 MCG PO TABS
50.0000 ug | ORAL_TABLET | Freq: Every day | ORAL | 1 refills | Status: DC
  Filled 2024-01-25: qty 90, 90d supply, fill #0
  Filled 2024-03-26 – 2024-04-19 (×2): qty 90, 90d supply, fill #1

## 2024-02-01 ENCOUNTER — Ambulatory Visit
Admission: RE | Admit: 2024-02-01 | Discharge: 2024-02-01 | Disposition: A | Discharge status: Home / Self Care | Payer: PPO | Source: Ambulatory Visit | Attending: Hematology | Admitting: Hematology

## 2024-02-01 DIAGNOSIS — Z1239 Encounter for other screening for malignant neoplasm of breast: Secondary | ICD-10-CM | POA: Diagnosis not present

## 2024-02-01 MED ORDER — GADOPICLENOL 0.5 MMOL/ML IV SOLN
8.0000 mL | Freq: Once | INTRAVENOUS | Status: AC | PRN
  Administered 2024-02-01: 8 mL via INTRAVENOUS

## 2024-02-04 DIAGNOSIS — H04123 Dry eye syndrome of bilateral lacrimal glands: Secondary | ICD-10-CM | POA: Diagnosis not present

## 2024-02-04 DIAGNOSIS — Z961 Presence of intraocular lens: Secondary | ICD-10-CM | POA: Diagnosis not present

## 2024-02-04 DIAGNOSIS — H43813 Vitreous degeneration, bilateral: Secondary | ICD-10-CM | POA: Diagnosis not present

## 2024-02-04 DIAGNOSIS — H5213 Myopia, bilateral: Secondary | ICD-10-CM | POA: Diagnosis not present

## 2024-02-04 DIAGNOSIS — H18513 Endothelial corneal dystrophy, bilateral: Secondary | ICD-10-CM | POA: Diagnosis not present

## 2024-02-05 ENCOUNTER — Other Ambulatory Visit (HOSPITAL_COMMUNITY): Payer: Self-pay

## 2024-02-05 ENCOUNTER — Encounter: Payer: Self-pay | Admitting: Gastroenterology

## 2024-02-05 ENCOUNTER — Ambulatory Visit: Payer: PPO | Admitting: Gastroenterology

## 2024-02-05 VITALS — BP 118/68 | HR 74 | Ht 63.0 in | Wt 190.0 lb

## 2024-02-05 DIAGNOSIS — J309 Allergic rhinitis, unspecified: Secondary | ICD-10-CM | POA: Diagnosis not present

## 2024-02-05 DIAGNOSIS — I864 Gastric varices: Secondary | ICD-10-CM

## 2024-02-05 DIAGNOSIS — K2210 Ulcer of esophagus without bleeding: Secondary | ICD-10-CM | POA: Diagnosis not present

## 2024-02-05 DIAGNOSIS — R42 Dizziness and giddiness: Secondary | ICD-10-CM

## 2024-02-05 DIAGNOSIS — I8500 Esophageal varices without bleeding: Secondary | ICD-10-CM

## 2024-02-05 DIAGNOSIS — K2100 Gastro-esophageal reflux disease with esophagitis, without bleeding: Secondary | ICD-10-CM

## 2024-02-05 MED ORDER — PANTOPRAZOLE SODIUM 20 MG PO TBEC
20.0000 mg | DELAYED_RELEASE_TABLET | Freq: Every day | ORAL | 3 refills | Status: DC
  Filled 2024-02-05 – 2024-03-26 (×2): qty 90, 90d supply, fill #0

## 2024-02-05 NOTE — Progress Notes (Unsigned)
 Dawn [name]    [id number]    [birth year]  Primary Care Physician:Metheney, Corita Diego, MD  Referring Physician: Cydney Draft, MD [address] [place],  [place] [address]   Chief complaint:  NASH, varices  Discussed the use of AI scribe software for clinical note transcription with the patient, who gave verbal consent to proceed.  History of Present Illness Dawn [name] is a 73 year old female with esophageal varices who presents for follow-up of her gastrointestinal and allergy symptoms.  She has a history of esophageal varices, previously managed with coiling and embolization by interventional radiology. A scan last year showed stable varices. 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 No current heartburn, stomach pain, or significant gastrointestinal symptoms.  She experiences occasional stomach discomfort, described as feeling tight or gassy, particularly at night. Walking around helps alleviate these symptoms. She takes pantoprazole  20 mg every night for acid reflux, which has been effective in controlling her symptoms.  She recently experienced an episode of vertigo, which was less severe than previous episodes characterized by room spinning. The vertigo was felt in her head, prompting her to sleep. There were no changes in her medication regimen at the time of the episode. She has been experiencing increased allergy symptoms this year, which she suspects might be related to the vertigo. She takes an allergy pill at lunchtime to manage her allergy symptoms.  She experiences skin breakouts, which she attributes to her allergies and previous immunotherapy treatment. She reports constant tinnitus and occasional pruritus.  She is planning a trip to [place], [place], and [place] with her sister in June.    GI Hx: EGD 04-17-23  - Grade I esophageal varices.  - Esophageal ulcers with no bleeding  and no stigmata of recent bleeding.  - 3 cm hiatal hernia.  - Gastritis. Biopsied.  - Type 1 isolated gastric varices (IGV1, varices located in the fundus), status post previous eradication.  - Normal examined duodenum. Surgical [P], gastric antrum and gastric body - ANTRAL AND OXYNTIC MUCOSA WITH MILD CHEMICAL/REACTIVE CHANGE, OTHERWISE NO SIGNIFICANT PATHOLOGY. - NO HELICOBACTER PYLORI ORGANISMS IDENTIFIED ON H&E STAINED SLIDE.   CT Angio Abd/ pelvis brto 03-16-23 1. Resolution of previously visualized gastric varix status post retrograde coil assisted transvenous obliteration. 2.  Aortic Atherosclerosis (ICD10-I70.0).   Liver biopsy 02/16/2023 A. LIVER, RIGHT LOBE, NEEDLE CORE BIOPSY:  -  Mild active steatohepatitis with thin fibrous septa    CT angio abdomen pelvis 03/13/2023 1. Resolution of previously visualized gastric varix status post retrograde coil assisted transvenous obliteration. 2.  Aortic Atherosclerosis (ICD10-I70.0).   EGD 12-24-22 - Grade I esophageal varices.  - Z-line regular, 38 cm from the incisors.  - Type 2 gastroesophageal varices (GOV2, esophageal varices which extend along the fundus), without bleeding.  - Portal hypertensive gastropathy.  - Normal examined duodenum.  - No specimens collected.   Cirrhosis on recent CT at the request of Dr. Duaine German.   EGD 11-22-19 - Moderate inflammation characterized by erosions, erythema and granularity was found in the gastric antrum. Biopsies were taken with a cold forceps for histology. - Isolated proximal stomach varices along the greater curvature of the stomach. No signs of recent bleeding. - Mild reflux related esophagitis (LA Grade A). - The examination was otherwise normal. Pathology: Surgical [P], colon, transverse, polyp (2) - TUBULAR ADENOMA. - SESSILE SERRATED POLYP WITHOUT  DYSPLASIA. - NO HIGH GRADE DYSPLASIA OR MALIGNANCY.   COLONOSCOPY 11-22-19 - Two 2 to 4 mm polyps in the transverse colon,  removed with a cold snare. Resected and retrieved. - Diverticulosis in the left colon. - The examination was otherwise normal on direct and retroflexion views. Pathology: Surgical [P], gastric antrum and gastric body - MILD CHRONIC GASTRITIS. - WARTHIN-STARRY IS NEGATIVE FOR HELICOBACTER PYLORI. - NO INTESTINAL METAPLASIA, DYSPLASIA, OR MALIGNANCY.   COLONOSCOPY 10-26-14 1. Normal colonoscopy  2. There was mild diverticulosis noted in the sigmoid colon  3.long redundant hypotonic colon. Difficult exam. Decreased haustral folds, suspect colonic inertia   COLONOSCOPY 09-18-2009 -mild diverticulosis noted in the sigmoid colon  Outpatient Encounter Medications as of 02/05/2024  Medication Sig   acetaminophen  (TYLENOL ) 500 MG tablet Take 500 mg by mouth every 6 (six) hours as needed for moderate pain.   Alpha-Lipoic Acid 600 MG CAPS Take 1 capsule (600 mg total) by mouth daily.   anastrozole  (ARIMIDEX ) 1 MG tablet Take 1 tablet (1 mg total) by mouth daily.   Ascorbic Acid (VITAMIN C) 1000 MG tablet Take 1,000 mg by mouth daily.   atorvastatin  (LIPITOR) 20 MG tablet Take 1 tablet (20 mg total) by mouth daily.   b complex vitamins capsule Take 1 capsule by mouth every other day.   Cholecalciferol (VITAMIN D3) 75 MCG (3000 UT) TABS Take 1 capsule by mouth daily.   fluticasone  (FLONASE ) 50 MCG/ACT nasal spray Place 2 sprays into both nostrils daily.   levothyroxine  (SYNTHROID ) 50 MCG tablet Take 1 tablet (50 mcg total) by mouth daily.   Multiple Vitamin (MULTIVITAMIN) capsule Take 1 capsule by mouth daily.   olmesartan -hydrochlorothiazide  (BENICAR  HCT) 20-12.5 MG tablet Take 1 tablet by mouth daily.   pantoprazole  (PROTONIX ) 20 MG tablet Take 1 tablet (20 mg total) by mouth daily.   pyridOXINE (VITAMIN B-6) 50 MG tablet Take 1 tablet (50 mg total) by mouth daily.   [DISCONTINUED] cefdinir  (OMNICEF ) 300 MG capsule Take 1 capsule (300 mg total) by mouth 2 (two) times daily.   [DISCONTINUED]  HYDROcodone  bit-homatropine (HYCODAN) 5-1.5 MG/5ML syrup Take 5 mLs by mouth at bedtime as needed for cough.   No facility-administered encounter medications on file as of 02/05/2024.    Allergies as of 02/05/2024 - Review Complete 02/05/2024  Allergen Reaction Noted   Ciprofloxacin  Hives 05/25/2017   Paclitaxel  Rash 11/19/2017   Crestor [rosuvastatin calcium ] Other (See Comments) 09/26/2011   Fosamax  [alendronate  sodium] Other (See Comments) 11/19/2017   Livalo  [pitavastatin ] Other (See Comments) 06/08/2020   Penicillins Rash 04/22/2007    Past Medical History:  Diagnosis Date   Allergy 2018   Anemia    as a child.   Arthritis    Atypical mole 06/04/2018   moderate top of right foot   Atypical mole 06/04/2018   left elbow mild   Atypical mole 06/04/2018   left upper back mild   Bilateral cataracts    Bilateral leg cramps    Cancer (HCC)    skin   Dyspnea    Endometrial cancer (HCC)    Family history of bladder cancer    Family history of colon cancer in father    Fatty liver    Fuchs' corneal dystrophy    GERD (gastroesophageal reflux disease)    Hearing loss    Right side 30%   Heart murmur 1960   Heartburn    History of hiatal hernia    small size   History of radiation therapy 08/21/17,  08/28/17,09/01/17, 09/11/17, 09/17/17   Vaginal cuff brachytherapy.    History of radiation therapy 11/11/17- 12/08/17   Right Breast treated to 40.05 Gy with 15 fx of 2.67 Gy and a boost of 10 Gy with 5 fx.    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   Uterine cancer (HCC) 04/2017   endometrial cancer    Varices, gastric    Vertigo     Past Surgical History:  Procedure Laterality Date   ABDOMINAL HYSTERECTOMY  2018   BREAST BIOPSY Right 04/27/2017   BREAST LUMPECTOMY WITH RADIOACTIVE SEED AND SENTINEL LYMPH NODE BIOPSY Right 05/25/2017   Procedure: RIGHT BREAST LUMPECTOMY WITH RADIOACTIVE SEED AND  RIGHT AXILLARY SENTINEL LYMPH NODE BIOPSY;  Surgeon: Ayesha Lente, MD;  Location: [facility];  Service: General;  Laterality: Right;   CATARACT EXTRACTION, BILATERAL     COLONOSCOPY     EYE SURGERY  2018   HYSTEROSCOPY WITH D & C N/A 04/29/2017   Procedure: DILATATION AND CURETTAGE /HYSTEROSCOPY;  Surgeon: Dyanna Glasgow, DO;  Location: [facility];  Service: Gynecology;  Laterality: N/A;   IR ANGIOGRAM SELECTIVE EACH ADDITIONAL VESSEL  02/16/2023   IR EMBO ART  VEN HEMORR LYMPH EXTRAV  INC GUIDE ROADMAPPING  02/16/2023   IR RADIOLOGIST EVAL & MGMT  01/27/2023   IR RADIOLOGIST EVAL & MGMT  03/18/2023   IR TRANSCATHETER BX  02/16/2023   IR US  GUIDE VASC ACCESS RIGHT  02/16/2023   IR VENOGRAM RENAL UNI LEFT  02/16/2023   PORT-A-CATH REMOVAL Left 04/16/2021   Procedure: REMOVAL PORT-A-CATH;  Surgeon: Jacolyn Matar, MD;  Location: [facility];  Service: General;  Laterality: Left;   PORTACATH PLACEMENT Left 05/25/2017   Procedure: INSERTION PORT-A-CATH;  Surgeon: Ayesha Lente, MD;  Location: [facility];  Service: General;  Laterality: Left;   ROBOTIC ASSISTED TOTAL HYSTERECTOMY WITH BILATERAL SALPINGO OOPHERECTOMY N/A 06/09/2017   Procedure: XI ROBOTIC ASSISTED TOTAL LAPOROSCOPIC HYSTERECTOMY WITH BILATERAL SALPINGO OOPHORECTOMY;  Surgeon: Alphonso Aschoff, MD;  Location: [facility];  Service: Gynecology;  Laterality: N/A;   SENTINEL NODE BIOPSY N/A 06/09/2017   Procedure: SENTINEL NODE BIOPSY;  Surgeon: Alphonso Aschoff, MD;  Location: [facility];  Service: Gynecology;  Laterality: N/A;    Family History  Problem Relation Age of Onset   Stroke Mother 51   Depression  Mother    Anxiety disorder Mother    Bipolar disorder Mother    Obesity Mother    Colon cancer Father 39   Heart attack Father 47   Prostate cancer Father        dx 15's   Bladder Cancer Father 59   Hypertension Father    Hyperlipidemia Father    Heart disease Father    Obesity Father    Hyperlipidemia Sister    Asthma Brother    Hyperlipidemia Brother    Heart attack Maternal Grandmother 53   Heart disease Maternal Grandmother    Diabetes Maternal Grandfather    Kidney disease Maternal Grandfather    Cancer Paternal Grandmother  45       GYN cancer ( thinks ovarian, maybe uterine)   Anxiety disorder Daughter    Anxiety disorder Son    Breast cancer Other 79   Colon cancer Other 33   Cancer Other        type unk, age dx unk   Esophageal cancer Neg Hx    Stomach cancer Neg Hx    Rectal cancer Neg Hx     Social History   Socioeconomic History   Marital status: Married    Spouse name: Cornel Diesel   Number of children: 2   Years of education: 16   Highest education level: Bachelor's degree (e.g., BA, AB, BS)  Occupational History    Employer: [employer]    Comment: [employer]   Tobacco Use   Smoking status: Never   Smokeless tobacco: Never  Vaping Use   Vaping status: Never Used  Substance and Sexual Activity   Alcohol use: Not Currently    Comment: <1/week wine or beer occasional   Drug use: No   Sexual activity: Yes    Birth control/protection: Post-menopausal  Other Topics Concern   Not on file  Social History Narrative   Lives with her husband. Still works part time. She enjoys hiking, walking and gardening.     Social Drivers of Corporate investment banker Strain: Low Risk  (12/22/2023)   Overall Financial Resource Strain (CARDIA)    Difficulty of Paying Living Expenses: Not hard at all  Food Insecurity: No Food Insecurity (12/22/2023)   Hunger Vital Sign    Worried About Running Out of Food in the Last Year: Never true    Ran Out  of Food in the Last Year: Never true  Transportation Needs: No Transportation Needs (12/22/2023)   PRAPARE - Administrator, Civil Service (Medical): No    Lack of Transportation (Non-Medical): No  Physical Activity: Sufficiently Active (12/22/2023)   Exercise Vital Sign    Days of Exercise per Week: 5 days    Minutes of Exercise per Session: 40 min  Stress: No Stress Concern Present (12/22/2023)   [employer] of Occupational Health - Occupational Stress Questionnaire    Feeling of Stress : Only a little  Social Connections: Socially Integrated (12/22/2023)   Social Connection and Isolation Panel [NHANES]    Frequency of Communication with Friends and Family: Three times a week    Frequency of Social Gatherings with Friends and Family: Once a week    Attends Religious Services: More than 4 times per year    Active Member of [employer] or Organizations: Yes    Attends Engineer, structural: More than 4 times per year    Marital Status: Married  Catering manager Violence: Not At Risk (11/30/2023)   Humiliation, Afraid, Rape, and Kick questionnaire    Fear of Current or Ex-Partner: No    Emotionally Abused: No    Physically Abused: No    Sexually Abused: No      Review of systems: All other review of systems negative except as mentioned in the HPI.   Physical Exam: Vitals:   02/05/24 0820  BP: 118/68  Pulse: 74   Body mass index is 33.66 kg/m. Gen:      No acute distress HEENT:  sclera anicteric Abd:      soft, non-tender; no palpable masses, no distension Ext:    No edema Neuro: alert and oriented x 3 Psych: normal mood and  affect  Data Reviewed:  Reviewed labs, radiology imaging, old records and pertinent past GI work up     Assessment and Plan Assessment & Plan Esophageal  and gastric varices Esophageal and gastric  varices are well-managed with no current symptoms of bleeding or enlargement. Previous embolization was successful. 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 Surveillance EGD is planned to monitor for potential enlargement and increased bleeding risk. - Schedule EGD in August to assess varices - Educate on symptoms of bleeding such as melena, abdominal pain, hematemesis, or signs of anemia and advise seeking emergency care if she occurs  Esophageal ulcers due to GERD Esophageal ulcers from GERD are controlled with pantoprazole . No current symptoms of heartburn. - Continue pantoprazole  20 mg nightly  Vertigo Recent episode of vertigo without severe spinning sensation. No recent medication changes. Possible association with allergic rhinitis and inner ear fluid. - Recommend ENT consultation if vertigo persists  Allergic rhinitis Increased allergy symptoms potentially contributing to vertigo. Possible skin breakouts related to immunotherapy. - Continue current allergy medication regimen    The patient was provided an opportunity to ask questions and all were answered. The patient agreed with the plan and demonstrated an understanding of the instructions.  Lorena Rolling , MD    CC: Cydney Draft, *

## 2024-02-05 NOTE — Patient Instructions (Addendum)
 VISIT SUMMARY:  Today, we discussed your gastrointestinal and allergy symptoms, as well as a recent episode of vertigo. Your esophageal varices are stable, and your GERD is well-controlled with medication. We also reviewed your increased allergy symptoms and skin breakouts.  YOUR PLAN:  -ESOPHAGEAL AND GASTRIC VARICES: Esophageal varices are enlarged veins in the esophagus and stomach due to a blocked splenic vein. Your condition is stable with no current symptoms. We will schedule an EGD in August to monitor for any changes. Please be aware of symptoms like black stools, abdominal pain, vomiting blood, or signs of anemia, and seek emergency care if they occur.  -ESOPHAGEAL ULCERS DUE TO GERD: GERD can cause ulcers in the esophagus. Your condition is well-controlled with pantoprazole . Continue taking pantoprazole  20 mg every night.  -VERTIGO: Vertigo is a sensation of spinning or dizziness. Your recent episode was mild and may be related to your allergies. If the vertigo persists, we recommend seeing an ENT specialist.  -ALLERGIC RHINITIS: Allergic rhinitis is inflammation of the nasal passages due to allergies. Continue your current allergy medication regimen to manage your symptoms.  INSTRUCTIONS:  Schedule an EGD in August to monitor your esophageal varices. If you experience symptoms like black stools, abdominal pain, vomiting blood, or signs of anemia, seek emergency care immediately. If your vertigo persists, consider consulting an ENT specialist.  You have been scheduled for an endoscopy. Please follow written instructions given to you at your visit today.  If you use inhalers (even only as needed), please bring them with you on the day of your procedure.  If you take any of the following medications, they will need to be adjusted prior to your procedure:   DO NOT TAKE 7 DAYS PRIOR TO TEST- Trulicity (dulaglutide) Ozempic, Wegovy (semaglutide) Mounjaro (tirzepatide) Bydureon Bcise  (exanatide extended release)  DO NOT TAKE 1 DAY PRIOR TO YOUR TEST Rybelsus (semaglutide) Adlyxin (lixisenatide) Victoza (liraglutide) Byetta (exanatide) ___________________________________________________________________________    Due to recent changes in healthcare laws, you may see the results of your imaging and laboratory studies on MyChart before your provider has had a chance to review them.  We understand that in some cases there may be results that are confusing or concerning to you. Not all laboratory results come back in the same time frame and the provider may be waiting for multiple results in order to interpret others.  Please give us  48 hours in order for your provider to thoroughly review all the results before contacting the office for clarification of your results.    I appreciate the  opportunity to care for you  Thank You   Kavitha Nandigam , MD

## 2024-03-23 DIAGNOSIS — L570 Actinic keratosis: Secondary | ICD-10-CM | POA: Diagnosis not present

## 2024-03-23 DIAGNOSIS — D485 Neoplasm of uncertain behavior of skin: Secondary | ICD-10-CM | POA: Diagnosis not present

## 2024-03-23 DIAGNOSIS — L814 Other melanin hyperpigmentation: Secondary | ICD-10-CM | POA: Diagnosis not present

## 2024-03-23 DIAGNOSIS — D2261 Melanocytic nevi of right upper limb, including shoulder: Secondary | ICD-10-CM | POA: Diagnosis not present

## 2024-03-23 DIAGNOSIS — Z85828 Personal history of other malignant neoplasm of skin: Secondary | ICD-10-CM | POA: Diagnosis not present

## 2024-03-23 DIAGNOSIS — L820 Inflamed seborrheic keratosis: Secondary | ICD-10-CM | POA: Diagnosis not present

## 2024-03-23 DIAGNOSIS — D2272 Melanocytic nevi of left lower limb, including hip: Secondary | ICD-10-CM | POA: Diagnosis not present

## 2024-03-23 DIAGNOSIS — D1801 Hemangioma of skin and subcutaneous tissue: Secondary | ICD-10-CM | POA: Diagnosis not present

## 2024-03-23 DIAGNOSIS — D220 Melanocytic nevi of lip: Secondary | ICD-10-CM | POA: Diagnosis not present

## 2024-03-23 DIAGNOSIS — D225 Melanocytic nevi of trunk: Secondary | ICD-10-CM | POA: Diagnosis not present

## 2024-03-23 DIAGNOSIS — L821 Other seborrheic keratosis: Secondary | ICD-10-CM | POA: Diagnosis not present

## 2024-03-26 ENCOUNTER — Other Ambulatory Visit (HOSPITAL_COMMUNITY): Payer: Self-pay

## 2024-03-26 ENCOUNTER — Other Ambulatory Visit: Payer: Self-pay | Admitting: Family Medicine

## 2024-03-26 DIAGNOSIS — I10 Essential (primary) hypertension: Secondary | ICD-10-CM

## 2024-03-26 DIAGNOSIS — E7849 Other hyperlipidemia: Secondary | ICD-10-CM

## 2024-03-28 ENCOUNTER — Other Ambulatory Visit: Payer: Self-pay

## 2024-03-28 ENCOUNTER — Other Ambulatory Visit (HOSPITAL_COMMUNITY): Payer: Self-pay

## 2024-03-28 MED ORDER — OLMESARTAN MEDOXOMIL-HCTZ 20-12.5 MG PO TABS
1.0000 | ORAL_TABLET | Freq: Every day | ORAL | 3 refills | Status: AC
  Filled 2024-03-28: qty 90, 90d supply, fill #0
  Filled 2024-06-23: qty 90, 90d supply, fill #1
  Filled 2024-09-29: qty 90, 90d supply, fill #2

## 2024-03-28 MED ORDER — ATORVASTATIN CALCIUM 20 MG PO TABS
20.0000 mg | ORAL_TABLET | Freq: Every day | ORAL | 1 refills | Status: DC
  Filled 2024-03-28: qty 90, 90d supply, fill #0
  Filled 2024-06-23: qty 90, 90d supply, fill #1

## 2024-03-29 ENCOUNTER — Other Ambulatory Visit: Payer: Self-pay

## 2024-04-22 ENCOUNTER — Encounter: Payer: Self-pay | Admitting: Gastroenterology

## 2024-05-02 ENCOUNTER — Other Ambulatory Visit (HOSPITAL_COMMUNITY): Payer: Self-pay

## 2024-05-02 ENCOUNTER — Encounter: Payer: Self-pay | Admitting: Gastroenterology

## 2024-05-02 ENCOUNTER — Ambulatory Visit (AMBULATORY_SURGERY_CENTER): Admitting: Gastroenterology

## 2024-05-02 VITALS — BP 118/78 | HR 62 | Temp 97.3°F | Resp 11 | Ht 63.0 in | Wt 190.0 lb

## 2024-05-02 DIAGNOSIS — K760 Fatty (change of) liver, not elsewhere classified: Secondary | ICD-10-CM | POA: Diagnosis not present

## 2024-05-02 DIAGNOSIS — K219 Gastro-esophageal reflux disease without esophagitis: Secondary | ICD-10-CM | POA: Diagnosis not present

## 2024-05-02 DIAGNOSIS — I8500 Esophageal varices without bleeding: Secondary | ICD-10-CM | POA: Diagnosis not present

## 2024-05-02 DIAGNOSIS — K449 Diaphragmatic hernia without obstruction or gangrene: Secondary | ICD-10-CM

## 2024-05-02 DIAGNOSIS — I864 Gastric varices: Secondary | ICD-10-CM | POA: Diagnosis not present

## 2024-05-02 MED ORDER — PANTOPRAZOLE SODIUM 20 MG PO TBEC
20.0000 mg | DELAYED_RELEASE_TABLET | Freq: Two times a day (BID) | ORAL | 3 refills | Status: AC
  Filled 2024-05-02 – 2024-06-21 (×2): qty 180, 90d supply, fill #0
  Filled 2024-09-15: qty 180, 90d supply, fill #1

## 2024-05-02 MED ORDER — SODIUM CHLORIDE 0.9 % IV SOLN: 500.0000 mL | Freq: Once | INTRAVENOUS | Status: DC

## 2024-05-02 NOTE — Progress Notes (Signed)
0805 Robinul 0.1 mg IV given due large amount of secretions upon assessment.  MD made aware, vss  

## 2024-05-02 NOTE — Patient Instructions (Signed)
 Resume previous diet Continue present medications Follow an antireflux regimen  Use Protonix  (pantoprazole ) 20mg  twice daily  Return to my office in 1 year  YOU HAD AN ENDOSCOPIC PROCEDURE TODAY AT THE [facility]:   Refer to the procedure report that was given to you for any specific questions about what was found during the examination.  If the procedure report does not answer your questions, please call your gastroenterologist to clarify.  If you requested that your care partner not be given the details of your procedure findings, then the procedure report has been included in a sealed envelope for you to review at your convenience later.  YOU SHOULD EXPECT: Some feelings of bloating in the abdomen. Passage of more gas than usual.  Walking can help get rid of the air that was put into your GI tract during the procedure and reduce the bloating. If you had a lower endoscopy (such as a colonoscopy or flexible sigmoidoscopy) you may notice spotting of blood in your stool or on the toilet paper. If you underwent a bowel prep for your procedure, you may not have a normal bowel movement for a few days.  Please Note:  You might notice some irritation and congestion in your nose or some drainage.  This is from the oxygen used during your procedure.  There is no need for concern and it should clear up in a day or so.  SYMPTOMS TO REPORT IMMEDIATELY: Following upper endoscopy (EGD)  Vomiting of blood or coffee ground material  New chest pain or pain under the shoulder blades  Painful or persistently difficult swallowing  New shortness of breath  Fever of 100F or higher  Black, tarry-looking stools  For urgent or emergent issues, a gastroenterologist can be reached at any hour by calling [phone]. Do not use MyChart messaging for urgent concerns.   DIET:  We do recommend a small meal at first, but then you may proceed to your regular diet.  Drink plenty of fluids but you should  avoid alcoholic beverages for 24 hours.  ACTIVITY:  You should plan to take it easy for the rest of today and you should NOT DRIVE or use heavy machinery until tomorrow (because of the sedation medicines used during the test).    FOLLOW UP: Our staff will call the number listed on your records the next business day following your procedure.  We will call around 7:15- 8:00 am to check on you and address any questions or concerns that you may have regarding the information given to you following your procedure. If we do not reach you, we will leave a message.     If any biopsies were taken you will be contacted by phone or by letter within the next 1-3 weeks.  Please call us  at [phone] if you have not heard about the biopsies in 3 weeks.   SIGNATURES/CONFIDENTIALITY: You and/or your care partner have signed paperwork which will be entered into your electronic medical record.  These signatures attest to the fact that that the information above on your After Visit Summary has been reviewed and is understood.  Full responsibility of the confidentiality of this discharge information lies with you and/or your care-partner.

## 2024-05-02 NOTE — Progress Notes (Signed)
 [facility] History and Physical   Primary Care Physician:  Alvan Dorothyann BIRCH, MD   Reason for Procedure:  Gastric varices  Plan:    EGD  with possible interventions as needed     HPI: Dawn [name] is a very pleasant 73 y.o. female here for EGD for surveillance of esophageal and gastric varices.   The risks and benefits as well as alternatives of endoscopic procedure(s) have been discussed and reviewed. All questions answered. The patient agrees to proceed.    Past Medical History:  Diagnosis Date   Allergy 2018   Anemia    as a child.   Arthritis    Atypical mole 06/04/2018   moderate top of right foot   Atypical mole 06/04/2018   left elbow mild   Atypical mole 06/04/2018   left upper back mild   Bilateral cataracts    Bilateral leg cramps    Cancer (HCC)    skin   Dyspnea    Endometrial cancer (HCC)    Family history of bladder cancer    Family history of colon cancer in father    Fatty liver    Fuchs' corneal dystrophy    GERD (gastroesophageal reflux disease)    Hearing loss    Right side 30%   Heart murmur 1960   Heartburn    History of hiatal hernia    small size   History of radiation therapy 08/21/17, 08/28/17,09/01/17, 09/11/17, 09/17/17   Vaginal cuff brachytherapy.    History of radiation therapy 11/11/17- 12/08/17   Right Breast treated to 40.05 Gy with 15 fx of 2.67 Gy and a boost of 10 Gy with 5 fx.    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   Uterine cancer (HCC) 04/2017   endometrial cancer   Varices, gastric    Vertigo     Past Surgical  History:  Procedure Laterality Date   ABDOMINAL HYSTERECTOMY  2018   BREAST BIOPSY Right 04/27/2017   BREAST LUMPECTOMY WITH RADIOACTIVE SEED AND SENTINEL LYMPH NODE BIOPSY Right 05/25/2017   Procedure: RIGHT BREAST LUMPECTOMY WITH RADIOACTIVE SEED AND  RIGHT AXILLARY SENTINEL LYMPH NODE BIOPSY;  Surgeon: Mikell Katz, MD;  Location:  SURGERY CENTER;  Service: General;  Laterality: Right;   CATARACT EXTRACTION, BILATERAL     COLONOSCOPY     EYE SURGERY  2018   HYSTEROSCOPY WITH D & C N/A 04/29/2017   Procedure: DILATATION AND CURETTAGE /HYSTEROSCOPY;  Surgeon: Dannielle Bouchard, DO;  Location: [facility];  Service: Gynecology;  Laterality: N/A;   IR ANGIOGRAM SELECTIVE EACH ADDITIONAL VESSEL  02/16/2023   IR EMBO ART  VEN HEMORR LYMPH EXTRAV  INC GUIDE ROADMAPPING  02/16/2023   IR RADIOLOGIST EVAL & MGMT  01/27/2023   IR RADIOLOGIST EVAL & MGMT  03/18/2023   IR TRANSCATHETER BX  02/16/2023   IR US  GUIDE VASC ACCESS RIGHT  02/16/2023   IR VENOGRAM RENAL UNI LEFT  02/16/2023   PORT-A-CATH REMOVAL Left 04/16/2021   Procedure: REMOVAL PORT-A-CATH;  Surgeon: Gladis Cough, MD;  Location: [facility];  Service: General;  Laterality: Left;   PORTACATH PLACEMENT Left 05/25/2017   Procedure: INSERTION PORT-A-CATH;  Surgeon: Mikell Katz, MD;  Location: [facility];  Service: General;  Laterality: Left;   ROBOTIC ASSISTED TOTAL HYSTERECTOMY WITH BILATERAL SALPINGO OOPHERECTOMY N/A 06/09/2017   Procedure: XI ROBOTIC ASSISTED TOTAL LAPOROSCOPIC HYSTERECTOMY WITH BILATERAL SALPINGO OOPHORECTOMY;  Surgeon: Eloy Herring, MD;  Location: [facility];  Service: Gynecology;  Laterality: N/A;   SENTINEL NODE BIOPSY N/A 06/09/2017   Procedure: SENTINEL NODE BIOPSY;  Surgeon: Eloy Herring, MD;  Location: [facility];  Service: Gynecology;  Laterality: N/A;    Prior to Admission medications   Medication Sig Start Date End Date Taking? Authorizing Provider  acetaminophen  (TYLENOL ) 500 MG  tablet Take 500 mg by mouth every 6 (six) hours as needed for moderate pain.   Yes [provider]  Alpha-Lipoic Acid 600 MG CAPS Take 1 capsule (600 mg total) by mouth daily. 06/28/20  Yes Curtis Debby PARAS, MD  anastrozole  (ARIMIDEX ) 1 MG tablet Take 1 tablet (1 mg total) by mouth daily. 09/23/23  Yes Lanny Callander, MD  Ascorbic Acid (VITAMIN C) 1000 MG tablet Take 1,000 mg by mouth daily.   Yes [provider]  atorvastatin  (LIPITOR) 20 MG tablet Take 1 tablet (20 mg total) by mouth daily. 03/28/24  Yes Alvan Dorothyann BIRCH, MD  b complex vitamins capsule Take 1 capsule by mouth every other day.   Yes [provider]  Cholecalciferol (VITAMIN D3) 75 MCG (3000 UT) TABS Take 1 capsule by mouth daily.   Yes [provider]  fluticasone  (FLONASE ) 50 MCG/ACT nasal spray Place 2 sprays into both nostrils daily. 12/07/23  Yes Alvan Dorothyann BIRCH, MD  levothyroxine  (SYNTHROID ) 50 MCG tablet Take 1 tablet (50 mcg total) by mouth daily. 01/25/24  Yes Alvan Dorothyann BIRCH, MD  Multiple Vitamin (MULTIVITAMIN) capsule Take 1 capsule by mouth daily.   Yes [provider]  olmesartan -hydrochlorothiazide  (BENICAR  HCT) 20-12.5 MG tablet Take 1 tablet by mouth daily. 03/28/24  Yes Alvan Dorothyann BIRCH, MD  pantoprazole  (PROTONIX ) 20 MG tablet Take 1 tablet (20 mg total) by mouth daily. 02/05/24  Yes Lash Matulich V, MD  pyridOXINE (VITAMIN B-6) 50 MG tablet Take 1 tablet (50 mg total) by mouth daily. 06/28/20  Yes Curtis Debby PARAS, MD    Current Outpatient Medications  Medication Sig Dispense Refill   acetaminophen  (TYLENOL ) 500 MG tablet Take 500 mg by mouth every 6 (six) hours as needed for moderate pain.     Alpha-Lipoic Acid 600 MG CAPS Take 1 capsule (600 mg total) by mouth daily. 90 capsule 3   anastrozole  (ARIMIDEX ) 1 MG tablet Take 1 tablet (1 mg total) by mouth daily. 90 tablet 3   Ascorbic Acid (VITAMIN C) 1000 MG tablet Take 1,000 mg by mouth  daily.     atorvastatin  (LIPITOR) 20 MG tablet Take 1 tablet (20 mg total) by mouth daily. 90 tablet 1   b complex vitamins capsule Take 1 capsule by mouth every other day.     Cholecalciferol (VITAMIN D3) 75 MCG (3000 UT) TABS Take 1 capsule by mouth daily.     fluticasone  (FLONASE ) 50 MCG/ACT nasal spray Place 2 sprays into both nostrils daily. 48 g 3   levothyroxine  (SYNTHROID ) 50 MCG tablet Take 1 tablet (50 mcg total) by mouth daily. 90 tablet 1   Multiple Vitamin (MULTIVITAMIN) capsule Take 1 capsule by mouth daily.     olmesartan -hydrochlorothiazide  (BENICAR   HCT) 20-12.5 MG tablet Take 1 tablet by mouth daily. 90 tablet 3   pantoprazole  (PROTONIX ) 20 MG tablet Take 1 tablet (20 mg total) by mouth daily. 90 tablet 3   pyridOXINE (VITAMIN B-6) 50 MG tablet Take 1 tablet (50 mg total) by mouth daily. 90 tablet 3   Current Facility-Administered Medications  Medication Dose Route Frequency Provider Last Rate Last Admin   0.9 %  sodium chloride  infusion  500 mL Intravenous Once Kaydon Husby V, MD        Allergies as of 05/02/2024 - Review Complete 05/02/2024  Allergen Reaction Noted   Ciprofloxacin  Hives 05/25/2017   Livalo  [pitavastatin ] Other (See Comments) 06/08/2020   Paclitaxel  Rash 11/19/2017   Crestor [rosuvastatin calcium ] Other (See Comments) 09/26/2011   Fosamax  [alendronate  sodium] Other (See Comments) 11/19/2017   Penicillins Rash 04/22/2007    Family History  Problem Relation Age of Onset   Stroke Mother 12   Depression Mother    Anxiety disorder Mother    Bipolar disorder Mother    Obesity Mother    Colon cancer Father 84   Heart attack Father 40   Prostate cancer Father        dx 32's   Bladder Cancer Father 90   Hypertension Father    Hyperlipidemia Father    Heart disease Father    Obesity Father    Hyperlipidemia Sister    Asthma Brother    Hyperlipidemia Brother    Heart attack Maternal Grandmother 5   Heart disease Maternal Grandmother     Diabetes Maternal Grandfather    Kidney disease Maternal Grandfather    Cancer Paternal Grandmother 61       GYN cancer ( thinks ovarian, maybe uterine)   Anxiety disorder Daughter    Anxiety disorder Son    Breast cancer Other 68   Colon cancer Other 73   Cancer Other        type unk, age dx unk   Esophageal cancer Neg Hx    Stomach cancer Neg Hx    Rectal cancer Neg Hx     Social History   Socioeconomic History   Marital status: Married    Spouse name: Marinda   Number of children: 2   Years of education: 16   Highest education level: Bachelor's degree (e.g., BA, AB, BS)  Occupational History    Employer: [employer]    Comment: [employer]   Tobacco Use   Smoking status: Never   Smokeless tobacco: Never  Vaping Use   Vaping status: Never Used  Substance and Sexual Activity   Alcohol use: Not Currently   Drug use: No   Sexual activity: Yes    Birth control/protection: Post-menopausal  Other Topics Concern   Not on file  Social History Narrative   Lives with her husband. Still works part time. She enjoys hiking, walking and gardening.     Social Drivers of Corporate investment banker Strain: Low Risk  (12/22/2023)   Overall Financial Resource Strain (CARDIA)    Difficulty of Paying Living Expenses: Not hard at all  Food Insecurity: No Food Insecurity (12/22/2023)   Hunger Vital Sign    Worried About Running Out of Food in the Last Year: Never true    Ran Out of Food in the Last Year: Never true  Transportation Needs: No Transportation Needs (12/22/2023)   PRAPARE - Administrator, Civil Service (Medical): No    Lack of Transportation (  Non-Medical): No  Physical Activity: Sufficiently Active (12/22/2023)   Exercise Vital Sign    Days of Exercise per Week: 5 days    Minutes of Exercise per Session: 40 min  Stress: No Stress Concern Present (12/22/2023)   [employer] of Occupational Health - Occupational Stress Questionnaire     Feeling of Stress : Only a little  Social Connections: Socially Integrated (12/22/2023)   Social Connection and Isolation Panel    Frequency of Communication with Friends and Family: Three times a week    Frequency of Social Gatherings with Friends and Family: Once a week    Attends Religious Services: More than 4 times per year    Active Member of [employer] or Organizations: Yes    Attends Engineer, structural: More than 4 times per year    Marital Status: Married  Catering manager Violence: Not At Risk (11/30/2023)   Humiliation, Afraid, Rape, and Kick questionnaire    Fear of Current or Ex-Partner: No    Emotionally Abused: No    Physically Abused: No    Sexually Abused: No    Review of Systems:  All other review of systems negative except as mentioned in the HPI.  Physical Exam: Vital signs in last 24 hours: BP (!) 163/98   Pulse 68   Temp (!) 97.3 F (36.3 C) (Temporal)   Resp 14   Ht 5' 3 (1.6 m)   Wt 190 lb (86.2 kg)   SpO2 94%   BMI 33.66 kg/m  General:   Alert, NAD Lungs:  Clear .   Heart:  Regular rate and rhythm Abdomen:  Soft, nontender and nondistended. Neuro/Psych:  Alert and cooperative. Normal mood and affect. A and O x 3  Reviewed labs, radiology imaging, old records and pertinent past GI work up  Patient is appropriate for planned procedure(s) and anesthesia in an ambulatory setting   K. Veena Uri Covey , MD [phone]

## 2024-05-02 NOTE — Op Note (Signed)
 [facility] Patient Name: Dawn [name] Procedure Date: 05/02/2024 7:18 AM MRN: [id number] Endoscopist: Gustav ALONSO Mcgee , MD, [id number] Age: 73 Referring MD:  Date of Birth: 10/16/1950 Gender: Female Account #: [id number] Procedure:                Upper GI endoscopy Indications:              Follow-up of gastric varices Medicines:                Monitored Anesthesia Care Procedure:                Pre-Anesthesia Assessment:                           - Prior to the procedure, a History and Physical                            was performed, and patient medications and                            allergies were reviewed. The patient's tolerance of                            previous anesthesia was also reviewed. The risks                            and benefits of the procedure and the sedation                            options and risks were discussed with the patient.                            All questions were answered, and informed consent                            was obtained. Prior Anticoagulants: The patient has                            taken no anticoagulant or antiplatelet agents. ASA                            Grade Assessment: II - A patient with mild systemic                            disease. After reviewing the risks and benefits,                            the patient was deemed in satisfactory condition to                            undergo the procedure.                           After obtaining informed consent, the endoscope was  passed under direct vision. Throughout the                            procedure, the patient's blood pressure, pulse, and                            oxygen saturations were monitored continuously. The                            GIF [id number] #[id number] was introduced through the                            mouth, and advanced to the second part of duodenum.                            The upper GI  endoscopy was accomplished without                            difficulty. The patient tolerated the procedure                            well. Scope In: Scope Out: Findings:                 Grade I varices were found in the middle third of                            the esophagus and in the lower third of the                            esophagus. They were less than 1 mm in largest                            diameter.                           A 4 cm hiatal hernia was present.                           The exam of the esophagus was otherwise normal.                           The cardia and gastric fundus were normal on                            retroflexion. No visisble gastric varices                           The stomach was normal.                           The examined duodenum was normal. Complications:            No immediate complications. Estimated Blood Loss:     Estimated blood loss: none. Impression:               -  Grade I esophageal varices.                           - 4 cm hiatal hernia.                           - Normal stomach.                           - Normal examined duodenum.                           - No specimens collected. Recommendation:           - Resume previous diet.                           - Continue present medications.                           - Follow an antireflux regimen.                           - Use Protonix  (pantoprazole ) 20 mg PO BID. Rx for                            90 days with 3 refills                           - Return to my office in 1 year. Dawn Giraud V. Delona Clasby, MD 05/02/2024 8:23:50 AM This report has been signed electronically.

## 2024-05-03 ENCOUNTER — Telehealth: Payer: Self-pay

## 2024-05-03 NOTE — Telephone Encounter (Signed)
  Follow up Call-     05/02/2024    7:32 AM 04/17/2023    1:38 PM 12/24/2022    1:38 PM  Call back number  Post procedure Call Back phone  # [phone]  Permission to leave phone message Yes Yes Yes    Attempted to call patient regarding follow-up. No answer, VM left.

## 2024-05-25 DIAGNOSIS — Z1231 Encounter for screening mammogram for malignant neoplasm of breast: Secondary | ICD-10-CM | POA: Diagnosis not present

## 2024-05-30 ENCOUNTER — Encounter: Payer: Self-pay | Admitting: Family Medicine

## 2024-05-30 ENCOUNTER — Ambulatory Visit (INDEPENDENT_AMBULATORY_CARE_PROVIDER_SITE_OTHER): Admitting: Family Medicine

## 2024-05-30 VITALS — BP 130/72 | HR 72 | Ht 63.0 in | Wt 189.1 lb

## 2024-05-30 DIAGNOSIS — I10 Essential (primary) hypertension: Secondary | ICD-10-CM | POA: Diagnosis not present

## 2024-05-30 DIAGNOSIS — Z23 Encounter for immunization: Secondary | ICD-10-CM | POA: Diagnosis not present

## 2024-05-30 DIAGNOSIS — R7303 Prediabetes: Secondary | ICD-10-CM | POA: Insufficient documentation

## 2024-05-30 DIAGNOSIS — R7301 Impaired fasting glucose: Secondary | ICD-10-CM

## 2024-05-30 DIAGNOSIS — G620 Drug-induced polyneuropathy: Secondary | ICD-10-CM | POA: Diagnosis not present

## 2024-05-30 DIAGNOSIS — M810 Age-related osteoporosis without current pathological fracture: Secondary | ICD-10-CM

## 2024-05-30 DIAGNOSIS — E039 Hypothyroidism, unspecified: Secondary | ICD-10-CM | POA: Diagnosis not present

## 2024-05-30 LAB — POCT GLYCOSYLATED HEMOGLOBIN (HGB A1C): Hemoglobin A1C: 5.8 % — AB (ref 4.0–5.6)

## 2024-05-30 NOTE — Progress Notes (Signed)
 Established Patient Office Visit  Subjective  Patient ID: Dawn [name], female    DOB: 04-03-1951  Age: 73 y.o. MRN: [id number]  Chief Complaint  Patient presents with   ifg   Hypothyroidism    HPI  Discussed the use of AI scribe software for clinical note transcription with the patient, who gave verbal consent to proceed.  History of Present Illness Dawn [name] is a 73 year old female who presents for a routine follow-up visit.  Recent viral illness - Experienced symptoms consistent with COVID-19 approximately one month ago, including headache and body aches - Treated symptoms with regular Tylenol  use - Husband tested positive for COVID-19 during the same period - Persistent congestion and throat tickle resulting in cough, managed with saline rinses - No sore throat present  Recent fall - Sustained a fall at a football game attributed to heat and possible sudden change in position after prolonged sitting - Landed on left shoulder without resulting bruises - No loss of consciousness occurred  Physical activity and functional status - Maintains an active lifestyle, walking over two miles regularly - Achieves 8,000 to 10,000 steps daily, including a mile at work each day  Cardiometabolic health - Most recent hemoglobin A1c is 5.8 - Occasionally checks blood pressure at work - No unusual headaches or chest pain  Dietary habits - Breakfast typically includes boiled eggs, English muffins with honey and butter, and green tea - Considering reducing butter intake to support weight loss - Enjoys Scientist, physiological and is exploring healthier options for accompaniments  Recent travel - Traveled to [place], [place], and [place] for 17 days with her sister - Trip was enjoyable despite some travel arrangement inconveniences      ROS    Objective:     BP 130/72   Pulse 72   Ht 5' 3 (1.6 m)   Wt 189 lb 1.3 oz (85.8 kg)   SpO2 95%   BMI 33.49 kg/m    Physical  Exam Vitals and nursing note reviewed.  Constitutional:      Appearance: Normal appearance.  HENT:     Head: Normocephalic and atraumatic.  Eyes:     Conjunctiva/sclera: Conjunctivae normal.  Cardiovascular:     Rate and Rhythm: Normal rate and regular rhythm.  Pulmonary:     Effort: Pulmonary effort is normal.     Breath sounds: Normal breath sounds.  Skin:    General: Skin is warm and dry.  Neurological:     Mental Status: She is alert.  Psychiatric:        Mood and Affect: Mood normal.      Results for orders placed or performed in visit on 05/30/24  POCT HgB A1C  Result Value Ref Range   Hemoglobin A1C 5.8 (A) 4.0 - 5.6 %   HbA1c POC (<> result, manual entry)     HbA1c, POC (prediabetic range)     HbA1c, POC (controlled diabetic range)        The 10-year ASCVD risk score (Arnett DK, et al., 2019) is: 15.7%    Assessment & Plan:   Problem List Items Addressed This Visit       Cardiovascular and Mediastinum   HYPERTENSION, BENIGN - Primary   Repeat BP looks better.        Relevant Orders   Lipid Panel With LDL/HDL Ratio   CMP14+EGFR     Endocrine   IFG (impaired fasting glucose)   Prediabetes and impaired fasting glucose A1c at 5.8% confirms  prediabetes. GLP-1 agonists not covered for prediabetes. Metformin considered for weight loss benefits despite gastrointestinal side effects. - Consider metformin for weight loss.      Relevant Orders   POCT HgB A1C (Completed)   Hypothyroid   Relevant Orders   TSH     Nervous and Auditory   Drug-induced polyneuropathy (HCC)   Takes B6, not on medications for this at this time.         Musculoskeletal and Integument   Osteoporosis   Last 07/2022 T score -2.4- [facility]   Due for repeat this Nov. Cont with Ca, vit D and weight bearing exercise.       Relevant Orders   DG Bone Density     Other   Prediabetes   Other Visit Diagnoses       Encounter for immunization       Relevant Orders   Flu  vaccine HIGH DOSE PF(Fluzone Trivalent) (Completed)       Assessment and Plan Assessment & Plan Overweight Discussed weight management strategies including dietary changes and physical activity. GLP-1 agonists not covered for prediabetes. - Reduce caloric intake by 150 calories per day. - Maintain physical activity with 8,000-10,000 steps daily.  Essential hypertension Blood pressure stable with no recent issues or symptoms. Can Monitor blood pressure at work.  Recent fall Fall attributed to heat and possible ?orthostatic hypotension?SABRA No significant injury or loss of consciousness.  General Health Maintenance Flu shot given. COVID-19 vaccine declined due to concerns about side effects. - Administered flu shot. - COVID-19 vaccine declined.   Return in about 6 months (around 11/27/2024) for Pre-diabetes, Hypertension.    Dorothyann Byars, MD

## 2024-05-30 NOTE — Assessment & Plan Note (Signed)
 Takes B6, not on medications for this at this time.

## 2024-05-30 NOTE — Assessment & Plan Note (Signed)
Repeat BP looks better.

## 2024-05-30 NOTE — Assessment & Plan Note (Signed)
 Last 07/2022 T score -2.4- [facility]   Due for repeat this Nov. Cont with Ca, vit D and weight bearing exercise.

## 2024-05-30 NOTE — Assessment & Plan Note (Signed)
 Prediabetes and impaired fasting glucose A1c at 5.8% confirms prediabetes. GLP-1 agonists not covered for prediabetes. Metformin considered for weight loss benefits despite gastrointestinal side effects. - Consider metformin for weight loss.

## 2024-05-31 ENCOUNTER — Ambulatory Visit: Payer: Self-pay | Admitting: Family Medicine

## 2024-05-31 LAB — LIPID PANEL WITH LDL/HDL RATIO
Cholesterol, Total: 171 mg/dL (ref 100–199)
HDL: 49 mg/dL (ref >39)
LDL Chol Calc (NIH): 83 mg/dL (ref 0–99)
LDL/HDL Ratio: 1.7 ratio (ref 0.0–3.2)
Triglycerides: 233 mg/dL — ABNORMAL HIGH (ref 0–149)
VLDL Cholesterol Cal: 39 mg/dL (ref 5–40)

## 2024-05-31 LAB — CMP14+EGFR
ALT: 46 IU/L — ABNORMAL HIGH (ref 0–32)
AST: 30 IU/L (ref 0–40)
Albumin: 4.6 g/dL (ref 3.8–4.8)
Alkaline Phosphatase: 51 IU/L (ref 51–125)
BUN/Creatinine Ratio: 15 (ref 12–28)
BUN: 12 mg/dL (ref 8–27)
Bilirubin Total: 0.6 mg/dL (ref 0.0–1.2)
CO2: 20 mmol/L (ref 20–29)
Calcium: 10 mg/dL (ref 8.7–10.3)
Chloride: 99 mmol/L (ref 96–106)
Creatinine, Ser: 0.81 mg/dL (ref 0.57–1.00)
Globulin, Total: 3 g/dL (ref 1.5–4.5)
Glucose: 123 mg/dL — ABNORMAL HIGH (ref 70–99)
Potassium: 3.7 mmol/L (ref 3.5–5.2)
Sodium: 139 mmol/L (ref 134–144)
Total Protein: 7.6 g/dL (ref 6.0–8.5)
eGFR: 77 mL/min/1.73 (ref >59)

## 2024-05-31 LAB — TSH: TSH: 3.52 u[IU]/mL (ref 0.450–4.500)

## 2024-05-31 NOTE — Progress Notes (Signed)
 Hi Dawn [name], total cholesterol and LDL look great triglycerides are still elevated.  Just continue to work on healthy Mediterranean diet and regular exercise, with moderate activity for 30 minutes 5 days a week.  The ALT liver enzyme has shifted up again similar to about a year ago we will plan to recheck again in 6 months.  Thyroid  level has shifted upward just slightly as well it is technically in the normal range.  Just make sure taking medication daily at least 4 hours away from any other vitamins and or supplements and at least 1 hour away from any food.

## 2024-06-02 ENCOUNTER — Encounter: Payer: Self-pay | Admitting: Hematology

## 2024-06-15 ENCOUNTER — Other Ambulatory Visit: Payer: Self-pay | Admitting: Interventional Radiology

## 2024-06-15 DIAGNOSIS — K766 Portal hypertension: Secondary | ICD-10-CM

## 2024-06-22 ENCOUNTER — Other Ambulatory Visit (HOSPITAL_COMMUNITY): Payer: Self-pay

## 2024-06-22 ENCOUNTER — Other Ambulatory Visit: Payer: Self-pay

## 2024-06-27 ENCOUNTER — Ambulatory Visit: Admitting: Family Medicine

## 2024-07-17 ENCOUNTER — Other Ambulatory Visit: Payer: Self-pay | Admitting: Family Medicine

## 2024-07-17 DIAGNOSIS — E039 Hypothyroidism, unspecified: Secondary | ICD-10-CM

## 2024-07-18 ENCOUNTER — Other Ambulatory Visit (HOSPITAL_COMMUNITY): Payer: Self-pay

## 2024-07-18 MED ORDER — LEVOTHYROXINE SODIUM 50 MCG PO TABS
50.0000 ug | ORAL_TABLET | Freq: Every day | ORAL | 1 refills | Status: AC
  Filled 2024-07-18: qty 90, 90d supply, fill #0
  Filled 2024-09-19 – 2024-10-16 (×2): qty 90, 90d supply, fill #1

## 2024-07-28 ENCOUNTER — Other Ambulatory Visit: Payer: Self-pay

## 2024-09-16 ENCOUNTER — Telehealth: Payer: Self-pay | Admitting: *Deleted

## 2024-09-16 NOTE — Telephone Encounter (Signed)
" ° °  Copied from CRM [id number]. Topic: Clinical - Request for Lab/Test Order >> Sep 16, 2024  1:02 PM Sophia H wrote: Reason for CRM: Patient states she is needing orders for a bone density scan sent to [facility]. Has an appt scheduled for 01/14 - needing sent before.   Phone # [phone] FAX # [phone]   Order for this was placed for pt already 05/30/2024. Will print and fax. "

## 2024-09-19 DIAGNOSIS — E7849 Other hyperlipidemia: Secondary | ICD-10-CM

## 2024-09-20 ENCOUNTER — Other Ambulatory Visit (HOSPITAL_COMMUNITY): Payer: Self-pay

## 2024-09-20 ENCOUNTER — Other Ambulatory Visit: Payer: Self-pay

## 2024-09-20 MED ORDER — ATORVASTATIN CALCIUM 20 MG PO TABS
20.0000 mg | ORAL_TABLET | Freq: Every day | ORAL | 1 refills | Status: AC
  Filled 2024-09-20: qty 90, 90d supply, fill #0

## 2024-09-22 ENCOUNTER — Ambulatory Visit: Payer: PPO | Admitting: Hematology

## 2024-09-22 ENCOUNTER — Other Ambulatory Visit: Payer: PPO

## 2024-09-29 ENCOUNTER — Ambulatory Visit: Payer: Self-pay

## 2024-09-29 NOTE — Telephone Encounter (Signed)
 Pt declined an appointment, is asking for something to be called in for her symptoms. (Sinus pressure and congestion x Monday; itchy rash to chest which she suspects is related to past cancer treatment.) Please advise.   FYI Only or Action Required?: Action required by provider: clinical question for provider and update on patient condition.  Patient was last seen in primary care on 05/30/2024 by Alvan Dorothyann BIRCH, MD.  Called Nurse Triage reporting Sinusitis.  Symptoms began several days ago.  Interventions attempted: OTC medications: tylenol , mucinex .  Symptoms are: gradually worsening.  Triage Disposition: Home Care  Patient/caregiver understands and will follow disposition?: Yes   Copied from CRM #[id number]. Topic: Clinical - Red Word Triage >> Sep 29, 2024  9:21 AM Montie POUR wrote: Red Word that prompted transfer to Nurse Triage:  Since Monday, she has been blowing out green mucus; low grade fever; rash on her chest; head itching, sinus congestion Reason for Disposition  [1] Sinus congestion as part of a cold AND [2] present < 10 days  Answer Assessment - Initial Assessment Questions 1. LOCATION: Where does it hurt?      Pressure over R eye  2. ONSET: When did the sinus pain start?  (e.g., hours, days)      Monday   3. SEVERITY: How bad is the pain?   (Scale 0-10; or none, mild, moderate or severe)     5/10  4. RECURRENT SYMPTOM: Have you ever had sinus problems before? If Yes, ask: When was the last time? and What happened that time?      Yes  5. NASAL CONGESTION: Is the nose blocked? If Yes, ask: Can you open it or must you breathe through your mouth?     I can get some of it out   6. NASAL DISCHARGE: Do you have discharge from your nose? If so ask, What color?     Green  7. FEVER: Do you have a fever? If Yes, ask: What is it, how was it measured, and when did it start?      Maybe a low grade fever, taking tylenol  and mucinex   8.  OTHER SYMPTOMS: Do you have any other symptoms? (e.g., sore throat, cough, earache, difficulty breathing)     Rash that itches on her chest (pt suspects it is from past cancer treatments) and itching scalp  Protocols used: Sinus Pain or Congestion-A-AH

## 2024-09-30 ENCOUNTER — Ambulatory Visit: Admitting: Family Medicine

## 2024-09-30 ENCOUNTER — Other Ambulatory Visit: Payer: Self-pay

## 2024-09-30 ENCOUNTER — Other Ambulatory Visit (HOSPITAL_BASED_OUTPATIENT_CLINIC_OR_DEPARTMENT_OTHER): Payer: Self-pay

## 2024-09-30 VITALS — BP 128/70 | HR 88 | Temp 97.6°F | Resp 16 | Ht 63.0 in | Wt 191.0 lb

## 2024-09-30 DIAGNOSIS — C541 Malignant neoplasm of endometrium: Secondary | ICD-10-CM

## 2024-09-30 DIAGNOSIS — L309 Dermatitis, unspecified: Secondary | ICD-10-CM | POA: Diagnosis not present

## 2024-09-30 DIAGNOSIS — C50211 Malignant neoplasm of upper-inner quadrant of right female breast: Secondary | ICD-10-CM

## 2024-09-30 DIAGNOSIS — J0110 Acute frontal sinusitis, unspecified: Secondary | ICD-10-CM

## 2024-09-30 MED ORDER — TRIAMCINOLONE ACETONIDE 0.1 % EX CREA
1.0000 | TOPICAL_CREAM | Freq: Two times a day (BID) | CUTANEOUS | 0 refills | Status: AC
  Filled 2024-09-30: qty 15, 30d supply, fill #0

## 2024-09-30 MED ORDER — DOXYCYCLINE HYCLATE 100 MG PO TABS
100.0000 mg | ORAL_TABLET | Freq: Two times a day (BID) | ORAL | 0 refills | Status: AC
  Filled 2024-09-30: qty 14, 7d supply, fill #0

## 2024-09-30 MED ORDER — METHYLPREDNISOLONE ACETATE 80 MG/ML IJ SUSP
80.0000 mg | Freq: Once | INTRAMUSCULAR | Status: AC
  Administered 2024-09-30: 80 mg via INTRAMUSCULAR

## 2024-09-30 NOTE — Telephone Encounter (Signed)
 She has to have an appointment and if its only been going on since Monday she is really not a candidate for an antibiotic at this point.  Now we could always schedule her Monday or Tuesday and then if she is still not better by then then we could consider an antibiotic

## 2024-09-30 NOTE — Progress Notes (Signed)
 Chief Complaint  Patient presents with   Cough     Cough and Congestion     Dawn [name] here for URI complaints.  Duration: 2 weeks  Associated symptoms: sinus headache, sinus congestion, sinus pain, itchy watery eyes, upper dental pain, and coughing Denies: rhinorrhea, ear pain, ear drainage, sore throat, wheezing, shortness of breath, myalgia, and fevers Treatment to date: Mucinex , Tylenol , Benadryl  Sick contacts: No  Has recurrent bouts of itching on the chest.  Sometimes has a on her scalp.  No new topicals, lotions, soaps, detergents. She has used a goat lotions which did help. No pain or drainage.   Past Medical History:  Diagnosis Date   Allergy 2018   Anemia    as a child.   Arthritis    Atypical mole 06/04/2018   moderate top of right foot   Atypical mole 06/04/2018   left elbow mild   Atypical mole 06/04/2018   left upper back mild   Bilateral cataracts    Bilateral leg cramps    Cancer (HCC)    skin   Dyspnea    Endometrial cancer (HCC)    Family history of bladder cancer    Family history of colon cancer in father    Fatty liver    Fuchs' corneal dystrophy    GERD (gastroesophageal reflux disease)    Hearing loss    Right side 30%   Heart murmur 1960   Heartburn    History of hiatal hernia    small size   History of radiation therapy 08/21/17, 08/28/17,09/01/17, 09/11/17, 09/17/17   Vaginal cuff brachytherapy.    History of radiation therapy 11/11/17- 12/08/17   Right Breast treated to 40.05 Gy with 15 fx of 2.67 Gy and a boost of 10 Gy with 5 fx.    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, mother had when  patient was child   Uterine cancer (HCC) 04/2017   endometrial cancer   Varices, esophageal (HCC)    Varices, gastric    Vertigo     Objective BP 128/70 (BP Location: Left Arm, Patient Position: Sitting)   Pulse 88   Temp 97.6 F (36.4 C) (Oral)   Resp 16   Ht 5' 3 (1.6 m)   Wt 191 lb (86.6 kg)   SpO2 98%   BMI 33.83 kg/m  General: Awake, alert, appears stated age HEENT: AT, [place], ears patent b/l and TM's neg, nares patent w/o discharge, pharynx pink and without exudates, MMM, no sinus ttp b/l over maxillary region, ttp over R frontal sinus Neck: No masses or asymmetry Heart: RRR Lungs: CTAB, no accessory muscle use Skin: Erythematous patch with some excoriation over the anterior right chest.  It does blanch.  There is no fluctuance, TTP, drainage, purulence, excessive warmth. Psych: Age appropriate judgment and insight, normal mood and affect  Acute frontal sinusitis, recurrence not specified - Plan: doxycycline  (VIBRA -TABS) 100 MG tablet, methylPREDNISolone  acetate (DEPO-MEDROL ) injection 80 mg  Dermatitis - Plan: triamcinolone  cream (KENALOG ) 0.1 %  Given duration, will treat with 7 days of doxycycline .  Depo-Medrol  injection today to help with both #1 and #2.  She has tolerated well in the past.  Continue to push fluids, practice good hand hygiene, cover mouth when coughing. F/u prn. If starting to experience worsening symptoms, shaking, or shortness of breath, seek immediate care. Kenalog  cream as needed, avoid scented products, try not to scratch. Pt voiced understanding and agreement to the plan.  Mabel [place], DO 09/30/24 4:28 PM

## 2024-09-30 NOTE — Patient Instructions (Addendum)
 Continue the Flonase .   Continue to push fluids, practice good hand hygiene, and cover your mouth if you cough.  If you start having fevers, shaking or shortness of breath, seek immediate care  Try not to scratch as this can make things worse. Avoid scented products while dealing with this. You may resume when the itchiness resolves. Cold/cool compresses can help.   Let us  know if you need anything.

## 2024-09-30 NOTE — Telephone Encounter (Signed)
 Patient calling back in. Wants to be seen today due to missed work all week and doesn't think this will resolve on it's own. She has an appointment with her cancer provider on Monday and states she can't bee seen that day. Works on Tuesday, can't come in. Scheduled with [facility] at 3:30pm for acute visit, no appts at [facility] today.

## 2024-10-02 NOTE — Assessment & Plan Note (Signed)
 Invasive ductal carcinoma, pT2N0M0, G2,  ER+/PR-/HER2+ -Diagnosed in 04/2017.  S/p right breast lumpectomy, adjuvant chemo and radiation. She tried Nerlynx  but could not tolerate due to diarrhea -She started anti-estrogen therapy with letrozole  in 12/2017. Due to joint pain she switched to Anastrozole  in 04/2018. Goal 7 years due to high risk disease.

## 2024-10-03 ENCOUNTER — Inpatient Hospital Stay: Attending: Hematology

## 2024-10-03 ENCOUNTER — Other Ambulatory Visit: Payer: Self-pay

## 2024-10-03 ENCOUNTER — Inpatient Hospital Stay: Admitting: Hematology

## 2024-10-03 VITALS — BP 141/83 | HR 83 | Temp 97.9°F | Resp 17 | Wt 191.1 lb

## 2024-10-03 DIAGNOSIS — Z79811 Long term (current) use of aromatase inhibitors: Secondary | ICD-10-CM | POA: Diagnosis not present

## 2024-10-03 DIAGNOSIS — Z170 Estrogen receptor positive status [ER+]: Secondary | ICD-10-CM

## 2024-10-03 DIAGNOSIS — Z79890 Hormone replacement therapy (postmenopausal): Secondary | ICD-10-CM | POA: Diagnosis not present

## 2024-10-03 DIAGNOSIS — I864 Gastric varices: Secondary | ICD-10-CM | POA: Diagnosis not present

## 2024-10-03 DIAGNOSIS — C50211 Malignant neoplasm of upper-inner quadrant of right female breast: Secondary | ICD-10-CM | POA: Insufficient documentation

## 2024-10-03 DIAGNOSIS — Z800 Family history of malignant neoplasm of digestive organs: Secondary | ICD-10-CM | POA: Insufficient documentation

## 2024-10-03 DIAGNOSIS — K7460 Unspecified cirrhosis of liver: Secondary | ICD-10-CM | POA: Diagnosis not present

## 2024-10-03 DIAGNOSIS — C541 Malignant neoplasm of endometrium: Secondary | ICD-10-CM | POA: Insufficient documentation

## 2024-10-03 DIAGNOSIS — I10 Essential (primary) hypertension: Secondary | ICD-10-CM | POA: Insufficient documentation

## 2024-10-03 DIAGNOSIS — E039 Hypothyroidism, unspecified: Secondary | ICD-10-CM | POA: Insufficient documentation

## 2024-10-03 DIAGNOSIS — Z79899 Other long term (current) drug therapy: Secondary | ICD-10-CM | POA: Insufficient documentation

## 2024-10-03 DIAGNOSIS — Z923 Personal history of irradiation: Secondary | ICD-10-CM | POA: Diagnosis not present

## 2024-10-03 DIAGNOSIS — M8580 Other specified disorders of bone density and structure, unspecified site: Secondary | ICD-10-CM | POA: Insufficient documentation

## 2024-10-03 DIAGNOSIS — Z8719 Personal history of other diseases of the digestive system: Secondary | ICD-10-CM | POA: Diagnosis not present

## 2024-10-03 DIAGNOSIS — K760 Fatty (change of) liver, not elsewhere classified: Secondary | ICD-10-CM | POA: Insufficient documentation

## 2024-10-03 LAB — CBC WITH DIFFERENTIAL (CANCER CENTER ONLY)
Abs Immature Granulocytes: 0.05 K/uL (ref 0.00–0.07)
Basophils Absolute: 0 K/uL (ref 0.0–0.1)
Basophils Relative: 1 %
Eosinophils Absolute: 0.1 K/uL (ref 0.0–0.5)
Eosinophils Relative: 2 %
HCT: 40.5 % (ref 36.0–46.0)
Hemoglobin: 14.5 g/dL (ref 12.0–15.0)
Immature Granulocytes: 1 %
Lymphocytes Relative: 37 %
Lymphs Abs: 2.4 K/uL (ref 0.7–4.0)
MCH: 33.2 pg (ref 26.0–34.0)
MCHC: 35.8 g/dL (ref 30.0–36.0)
MCV: 92.7 fL (ref 80.0–100.0)
Monocytes Absolute: 0.4 K/uL (ref 0.1–1.0)
Monocytes Relative: 7 %
Neutro Abs: 3.4 K/uL (ref 1.7–7.7)
Neutrophils Relative %: 52 %
Platelet Count: 166 K/uL (ref 150–400)
RBC: 4.37 MIL/uL (ref 3.87–5.11)
RDW: 12 % (ref 11.5–15.5)
WBC Count: 6.4 K/uL (ref 4.0–10.5)
nRBC: 0 % (ref 0.0–0.2)

## 2024-10-03 LAB — CMP (CANCER CENTER ONLY)
ALT: 51 U/L — ABNORMAL HIGH (ref 0–44)
AST: 30 U/L (ref 15–41)
Albumin: 4.8 g/dL (ref 3.5–5.0)
Alkaline Phosphatase: 57 U/L (ref 38–126)
Anion gap: 14 (ref 5–15)
BUN: 16 mg/dL (ref 8–23)
CO2: 25 mmol/L (ref 22–32)
Calcium: 10.2 mg/dL (ref 8.9–10.3)
Chloride: 98 mmol/L (ref 98–111)
Creatinine: 0.82 mg/dL (ref 0.44–1.00)
GFR, Estimated: >60 mL/min
Glucose, Bld: 148 mg/dL — ABNORMAL HIGH (ref 70–99)
Potassium: 3.5 mmol/L (ref 3.5–5.1)
Sodium: 137 mmol/L (ref 135–145)
Total Bilirubin: 0.5 mg/dL (ref 0.0–1.2)
Total Protein: 8.6 g/dL — ABNORMAL HIGH (ref 6.5–8.1)

## 2024-10-03 NOTE — Progress Notes (Signed)
 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  S/p right breast lumpectomy, adjuvant chemo and radiation. She tried Nerlynx  but could not tolerate due to diarrhea -She started anti-estrogen therapy with letrozole  in 12/2017. Due to joint pain she switched to Anastrozole  in 04/2018. Goal 7 years due to high risk disease.   Assessment & Plan Estrogen receptor positive right breast cancer, status post therapy She is nearing completion of seven years of adjuvant anastrozole  with no evidence of recurrence on recent breast MRI and mammogram. Dense breast tissue is present, and a stable, calcified, tender lump persists at the prior incision site. Endocrine therapy is almost complete, and ongoing surveillance is planned. - Continue anastrozole  through April 2026 to complete seven years of therapy; no further refills required after April 2026. - Reviewed recent breast MRI and mammogram, both unremarkable. - Discussed and planned contrast-enhanced mammogram for future screening due to dense breast tissue; order for September 2026 at [place]. - Advised regular self-breast exams and monitoring of the palpable lump for changes. - Annual oncology follow-up scheduled for surveillance for a total of ten years from diagnosis.  Endometrial cancer, status post therapy She is nearly five years post-therapy for endometrial cancer  with lymph node involvement, with no evidence of recurrence on recent imaging and endoscopy. Recurrence risk is now low, and routine surveillance imaging is not typically indicated beyond five years unless symptomatic. - Ordered one final CT abdomen and pelvis with contrast for surveillance per her request and for comprehensive assessment. - Advised that future surveillance will be annual clinical follow-up only unless new symptoms arise.  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 Etiology remains unclear as liver biopsy did not show cirrhosis or significant fibrosis. She underwent coil embolization and is followed by GI. She is aware of bleeding risk and avoids NSAIDs. Ongoing monitoring is warranted due to potential for serious complications. - Reviewed history of coil embolization. - Emphasized importance of ongoing GI follow-up; confirmed upcoming GI appointment in August 2026. - Advised avoidance of NSAIDs and other agents increasing bleeding risk.  Plan - She is clinically doing well, no concern for cancer recurrence -She will continue anastrozole  until the end of April 2026 - Obtain last surveillance CT scan in the next few weeks - Follow-up with NP in 1 year     SUMMARY OF ONCOLOGIC HISTORY: Oncology History Overview Note  MSI high Cancer Staging Endometrial cancer [facility]) Staging form: Corpus Uteri - Adenosarcoma, AJCC 8th Edition - Pathologic stage from 06/09/2017: Stage IIIC (pT1a, pN1, cM0) - Signed by Lanny Callander, MD on 06/16/2017  Malignant neoplasm of upper-inner quadrant of right breast in female, estrogen receptor positive (HCC) Staging form: Breast, AJCC 8th Edition - Clinical stage from 05/06/2017: Stage IA (cT1c, cN0, cM0, G2, ER: Positive, PR: Negative, HER2: Positive) - Unsigned - Pathologic stage from 05/25/2017: Stage IIA (pT2, pN0, cM0, G2, ER: Positive, PR: Negative, HER2: Negative) - Signed by Lanny Callander, MD on  06/16/2017      Malignant neoplasm of upper-inner quadrant of right breast in female, estrogen receptor positive (HCC)  04/27/2017 Initial Biopsy   Diagnosis Breast, right, needle core biopsy INVASIVE DUCTAL CARCINOMA, GRADE 2 Microscopic Comment The neoplasm has intracellular mucin and signet ring cell features. Immunostains shows these cells are positive for ER,GATA3, ck7 and GCDFP, negative for ck20, cdx2, TTF-1 and pax8, The immunostaining pattern supports the neoplasm is breast primary.   04/27/2017 Receptors her2   Estrogen Receptor: 70%, POSITIVE, MODERATE STAINING INTENSITY Progesterone Receptor: 0%, NEGATIVE Proliferation Marker Ki67: 30% HER2 - **POSITIVE** RATIO OF HER2/CEP17 SIGNALS 2.91 AVERAGE HER2 COPY NUMBER PER CELL 7.28   04/27/2017 Initial Diagnosis   Malignant neoplasm of upper-inner quadrant of right breast in female, estrogen receptor positive (HCC)   05/07/2017 Imaging   CT Chest W Contrast 05/07/17 IMPRESSION: Tiny well-defined fatty lesion on the pleura at the right lung base. The thinner slice collimation used for today's chest CT eliminates volume-averaging seen in the lesion on the prior exam and confirms that this is a diffusely fatty nodule. This is a benign finding and likely represents a tiny lipoma. No defect in the hemidiaphragm evident to suggest tiny diaphragmatic hernia. Pulmonary hamartoma a consideration although the lack of soft tissue components makes this less likely.   05/15/2017 Genetic Testing   Patient had genetic testing due to a personal history of breast cancer and uterine cancer as well as a family history of cancer. The Multi-Cancer panel was ordered. The Multi-Cancer Panel offered by [facility] includes sequencing and/or deletion duplication testing of the following 83 genes: ALK, APC, ATM, AXIN2,BAP1,  BARD1, BLM, BMPR1A, BRCA1, BRCA2, BRIP1, CASR, CDC73, CDH1, CDK4, CDKN1B, CDKN1C, CDKN2A (p14ARF), CDKN2A (p16INK4a), CEBPA, CHEK2, CTNNA1, DICER1,  DIS3L2, EGFR (c.2369C>T, p.Thr790Met variant only), EPCAM (Deletion/duplication testing only), FH, FLCN, GATA2, GPC3, GREM1 (Promoter region deletion/duplication testing only), HOXB13 (c.251G>A, p.Gly84Glu), HRAS, KIT, MAX, MEN1, MET, MITF (c.952G>A, p.Glu318Lys variant only), MLH1, MSH2, MSH3, MSH6, MUTYH, NBN, NF1, NF2, NTHL1, PALB2, PDGFRA, PHOX2B, PMS2, POLD1, POLE, POT1, PRKAR1A, PTCH1, PTEN, RAD50, RAD51C, RAD51D, RB1, RECQL4, RET, RUNX1, SDHAF2, SDHA (sequence changes only), SDHB, SDHC, SDHD, SMAD4, SMARCA4, SMARCB1, SMARCE1, STK11, SUFU, TERC, TERT, TMEM127, TP53, TSC1, TSC2, VHL, WRN and WT1.    Results: No pathogenic mutations were identified. A VUS in the GATA2 gene c.1348G>A (p.Gly450Arg) was identified.  The date of this test report is 05/25/2017.     05/25/2017 Surgery   RIGHT BREAST LUMPECTOMY WITH RADIOACTIVE SEED AND  RIGHT AXILLARY SENTINEL LYMPH NODE BIOPSY and Pot placement by Dr. Mikell 05/25/17   05/25/2017 Pathology Results   Diagnosis 05/25/17 1. Breast, lumpectomy, right - INVASIVE DUCTAL CARCINOMA, GRADE II/III, SPANNING 2.2 CM. - DUCTAL CARCINOMA IN SITU, HIGH GRADE. - THE SURGICAL RESECTION MARGINS ARE NEGATIVE FOR CARCINOMA. - SEE ONCOLOGY TABLE BELOW. 2. Breast, excision, right additional superior margin - BENIGN FIBROADIPOSE TISSUE. - BENIGN SKELETAL MUSCLE. - SEE COMMENT. 3. Breast, excision, right additional lateral margin - BENIGN BREAST PARENCHYMA. - THERE IS NO EVIDENCE OF MALIGNANCY. - SEE COMMENT. 4. Breast, excision, chest wall margin - BENIGN FIBROADIPOSE TISSUE. - THERE IS NO EVIDENCE OF MALIGNANCY. - SEE COMMENT. 5. Lymph node, sentinel, biopsy, right axillary - THERE IS NO EVIDENCE OF CARCINOMA IN 1 OF 1 LYMPH NODE (0/1). 6. Lymph node, sentinel, biopsy, right axillary - THERE IS NO EVIDENCE OF CARCINOMA IN 1 OF 1 LYMPH NODE (0/1). 7. Lymph node, sentinel, biopsy, right axillary - THERE IS NO EVIDENCE OF CARCINOMA IN  1 OF 1 LYMPH NODE (0/1).    06/26/2017 PET scan   PET  IMPRESSION: 1. Postoperative findings both in the right breast and in the anatomic pelvis, with associated low-grade activity considered to be postoperative in nature. No hypermetabolic adenopathy or hypermetabolic lesions are identified to suggest active metastatic disease/malignancy. 2. Other imaging findings of potential clinical significance: Aortic Atherosclerosis (ICD10-I70.0). Sigmoid colon diverticulosis. Biapical pleuroparenchymal scarring in the lungs. Small amount of free pelvic fluid in the cul-de-sac, likely postoperative.     07/01/2017 - 10/14/2017 Chemotherapy   Adjuvant TCH with Onpro every 3 weeks for 6 cycles starting on 07/01/17, Changed Taxol  to abraxane  with cycle 4 due to drug rash reaction, followed by Herceptin  every 3 weeks for 6 months    11/01/2017 - 12/08/2017 Radiation Therapy   Radiation therapy to her right breast with Dr. Izell   11/04/2017 - 06/2018 Chemotherapy   Maintenance Herceptin  starting 11/04/17 and will comeplete her 12 months in 06/2018    12/23/2017 -  Anti-estrogen oral therapy   Adjuvant letrozole  2.5 mg daily started 12/23/17, switched to exemestane  on 04/21/18 due to joint pain. Could not afford aromasin , started anastrozole  04/2018.    12/30/2017 Imaging   CT CAP W Contrast 12/30/17 IMPRESSION: Increased size of 1.1 cm aorto-caval retroperitoneal lymph node. This could be reactive due to interval hysterectomy, however metastatic carcinoma cannot be excluded. Consider continued attention on short-term follow-up CT, or PET-CT scan for further evaluation.   Mild hepatic steatosis.   01/25/2018 PET scan   IMPRESSION: 1. Enlarging hypermetabolic aortocaval lymph node is worrisome for metastatic disease. 2. Probable postoperative seroma in the medial right breast, with associated mild inflammatory hypermetabolism.   04/28/2018 Mammogram   Probably benign. Calcifications in the right breast most likely are  dystrophic, related to previous surgery, and are probably benign.    04/28/2018 Imaging   DEXA Scan T Score -2.0   06/10/2018 Echocardiogram   06/10/2018 ECHO LV EF: 55% -   60%   08/28/2018 - 08/2018 Chemotherapy   Neratinib  4mg  for 1 week then titrate up with 1 additional tablet weekly up to 12 mg starting 08/28/18. Stopped soon after starting due to diarrhea.   10/25/2018 Imaging   CT CAP 10/25/18  IMPRESSION: 1. No evidence of metastatic disease. 2.  Aortic atherosclerosis (ICD10-170.0).   06/15/2019 Pathology Results   Diagnosis Breast, right, needle core biopsy, 1 o'clock, 7cmfn - DENSE FIBROSIS CONSISTENT WITH PRIOR PROCEDURAL CHANGES. - NO MALIGNANCY IDENTIFIED.   10/24/2019 Imaging   CT CAP W Contrast  IMPRESSION: 1. Stable exam. No evidence of recurrent or metastatic carcinoma within the chest, abdomen, or pelvis. 2. Colonic diverticulosis, without radiographic evidence of diverticulitis.   Aortic Atherosclerosis (ICD10-I70.0).   12/05/2020 Imaging   MRI Breast  IMPRESSION: No suspicious areas of enhancement identified within the right or left breast to suggest malignancy.   Endometrial adenocarcinoma (HCC)  05/07/2017 Initial Diagnosis   Endometrial adenocarcinoma (HCC)   06/09/2017 Surgery   XI ROBOTIC ASSISTED TOTAL LAPOROSCOPIC HYSTERECTOMY WITH BILATERAL SALPINGO OOPHORECTOMY and SENTINEL NODE BIOPSY by Dr. Eloy 06/09/17   06/09/2017 Pathology Results   Diagnosis 06/09/17 1. Lymph node, sentinel, biopsy, right external iliac - METASTATIC ADENOCARCINOMA IN ONE LYMPH NODE (1/1). 2. Lymph node, sentinel, biopsy, left obturator - ONE BENIGN LYMPH NODE (0/1). 3. Lymph node, sentinel, biopsy, left external iliac - METASTATIC ADENOCARCINOMA IN ONE LYMPH NODE (1/1). 4. Uterus +/- tubes/ovaries, neoplastic ENDOMETRIUM: - ENDOMETRIAL ADENOCARCINOMA, 3.2 CM. - CARCINOMA INVADES INNER HALF OF  MYOMETRIUM. 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 - CERVIX,  BILATERAL FALLOPIAN TUBES AND BILATERAL OVARIES FREE OF TUMOR   08/18/2017 - 09/17/2017 Radiation Therapy   vaginal brachytherapy per Dr. 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   02/17/2018 - 02/08/2020 Antibody Plan   Keytruda  every 3 weeks starting 02/17/18. Completed 2 years on 02/08/20   04/11/2019 Imaging   CT AP W Contrast  IMPRESSION: 1. No evidence of recurrent or metastatic carcinoma within the abdomen or pelvis. 2. Colonic diverticulosis. No radiographic evidence of diverticulitis.     04/23/2020 Imaging   CT AP W contrast  IMPRESSION: 1. Stable exam. Specifically, no findings to suggest recurrent or metastatic disease. 2. Left colonic diverticulosis without diverticulitis. 3. Aortic Atherosclerosis (ICD10-I70.0).     10/22/2020 Imaging   CT CAP  IMPRESSION: 1. No evidence of metastatic disease in the chest, abdomen or pelvis. 2. Small hiatal hernia. 3. Mild sigmoid diverticulosis. 4. Aortic Atherosclerosis (ICD10-I70.0).        Discussed the use of AI scribe software for clinical note transcription with the patient, who gave verbal consent to proceed.  History of Present Illness Dawn [name] is a 74 year old female with ER-positive right breast cancer and endometrial cancer who presents for routine oncology follow-up.  She is nearing completion of seven years of adjuvant anastrozole , with final dose planned for April 2026. Surveillance imaging, including a mammogram in June 2025 and a breast MRI within the past year, were unremarkable aside from dense breast tissue. She performs regular self-exams and notes a stable, tender, calcified 1-2 cm lump at the prior right breast incision site at the one o'clock position. She has no new breast symptoms.  She completed two years of therapy for endometrial cancer with lymph node involvement in May 2021 and  has been on surveillance. Her last CT scan in 2024 for bleeding incidentally showed gastric varices. Endoscopy in August 2025 was normal, and she has had no recurrent gastrointestinal bleeding.  Her gastric varices were treated with coil embolization. Liver biopsy did not show cirrhosis or significant fibrosis, and the etiology is unclear. She avoids NSAIDs, does not consume alcohol, and is working on weight loss and exercise.  She has chronic pruritus and skin changes that she attributes to prior immunotherapy and uses topical creams from dermatology. She manages peripheral neuropathy with supplements and exercise.  She recently had a one-week sinus illness treated with steroids and antibiotics and has recovered. She remains physically active and is preparing for retirement.     All other systems were reviewed with the patient and are negative.  MEDICAL HISTORY:  Past Medical History:  Diagnosis Date   Allergy 2018   Anemia    as a child.   Arthritis    Atypical mole 06/04/2018   moderate top of right foot   Atypical mole 06/04/2018   left elbow mild   Atypical mole 06/04/2018   left upper back mild   Bilateral cataracts    Bilateral leg cramps    Cancer (HCC)    skin   Dyspnea    Endometrial cancer (HCC)    Family history of bladder cancer    Family history of colon cancer in father    Fatty liver    Fuchs' corneal dystrophy    GERD (gastroesophageal reflux disease)    Hearing loss    Right side 30%  Heart murmur 1960   Heartburn    History of hiatal hernia    small size   History of radiation therapy 08/21/17, 08/28/17,09/01/17, 09/11/17, 09/17/17   Vaginal cuff brachytherapy.    History of radiation therapy 11/11/17- 12/08/17   Right Breast treated to 40.05 Gy with 15 fx of 2.67 Gy and a boost of 10 Gy with 5 fx.    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   Uterine cancer (HCC) 04/2017   endometrial cancer   Varices, esophageal (HCC)    Varices, gastric    Vertigo     SURGICAL HISTORY: Past Surgical History:  Procedure Laterality Date   ABDOMINAL HYSTERECTOMY  2018   BREAST BIOPSY Right 04/27/2017   BREAST LUMPECTOMY WITH RADIOACTIVE SEED AND SENTINEL LYMPH NODE BIOPSY Right 05/25/2017   Procedure: RIGHT BREAST LUMPECTOMY WITH RADIOACTIVE SEED AND  RIGHT AXILLARY SENTINEL LYMPH NODE BIOPSY;  Surgeon: Mikell Katz, MD;  Location: [facility];  Service: General;  Laterality: Right;   CATARACT EXTRACTION, BILATERAL     COLONOSCOPY     EYE SURGERY  2018   HYSTEROSCOPY WITH D & C N/A 04/29/2017   Procedure: DILATATION AND CURETTAGE /HYSTEROSCOPY;  Surgeon: Dannielle Bouchard, DO;  Location: [facility];  Service: Gynecology;  Laterality: N/A;   IR ANGIOGRAM SELECTIVE EACH ADDITIONAL VESSEL  02/16/2023   IR EMBO ART  VEN HEMORR LYMPH EXTRAV  INC GUIDE ROADMAPPING  02/16/2023   IR RADIOLOGIST EVAL & MGMT  01/27/2023   IR RADIOLOGIST EVAL & MGMT  03/18/2023   IR TRANSCATHETER BX  02/16/2023   IR US  GUIDE VASC ACCESS RIGHT  02/16/2023   IR VENOGRAM RENAL UNI LEFT  02/16/2023   PORT-A-CATH REMOVAL Left 04/16/2021   Procedure: REMOVAL PORT-A-CATH;  Surgeon: Gladis Cough, MD;  Location: [facility];  Service: General;  Laterality: Left;   PORTACATH PLACEMENT Left 05/25/2017   Procedure: INSERTION PORT-A-CATH;  Surgeon: Mikell Katz, MD;  Location: [facility];  Service: General;  Laterality: Left;   ROBOTIC ASSISTED TOTAL HYSTERECTOMY WITH BILATERAL SALPINGO OOPHERECTOMY N/A 06/09/2017   Procedure: XI ROBOTIC ASSISTED TOTAL LAPOROSCOPIC  HYSTERECTOMY WITH BILATERAL SALPINGO OOPHORECTOMY;  Surgeon: Eloy Herring, MD;  Location: [facility];  Service: Gynecology;  Laterality: N/A;   SENTINEL NODE BIOPSY N/A 06/09/2017   Procedure: SENTINEL NODE BIOPSY;  Surgeon: Eloy Herring, MD;  Location: [facility];  Service: Gynecology;  Laterality: N/A;    I have reviewed the social history and family history with the patient and they are unchanged from previous note.  ALLERGIES:  is allergic to ciprofloxacin , livalo  [pitavastatin ], paclitaxel , crestor [rosuvastatin calcium ], fosamax  [alendronate  sodium], and penicillins.  MEDICATIONS:  Current Outpatient Medications  Medication Sig Dispense Refill   acetaminophen  (TYLENOL ) 500 MG tablet Take 500 mg by mouth every 6 (six) hours as needed for moderate pain.     Alpha-Lipoic Acid 600 MG CAPS Take 1 capsule (600 mg total) by mouth daily. 90 capsule 3   anastrozole  (ARIMIDEX ) 1 MG tablet Take 1 tablet (1 mg  total) by mouth daily. 90 tablet 3   Ascorbic Acid (VITAMIN C) 1000 MG tablet Take 1,000 mg by mouth daily.     atorvastatin  (LIPITOR) 20 MG tablet Take 1 tablet (20 mg total) by mouth daily. 90 tablet 1   b complex vitamins capsule Take 1 capsule by mouth every other day.     Cholecalciferol (VITAMIN D3) 75 MCG (3000 UT) TABS Take 1 capsule by mouth daily.     doxycycline  (VIBRA -TABS) 100 MG tablet Take 1 tablet (100 mg total) by mouth 2 (two) times daily for 7 days. 14 tablet 0   fluticasone  (FLONASE ) 50 MCG/ACT nasal spray Place 2 sprays into both nostrils daily. 48 g 3   levothyroxine  (SYNTHROID ) 50 MCG tablet Take 1 tablet (50 mcg total) by mouth daily. 90 tablet 1   Multiple Vitamin (MULTIVITAMIN) capsule Take 1 capsule by mouth daily.     olmesartan -hydrochlorothiazide  (BENICAR  HCT) 20-12.5 MG tablet Take 1 tablet by mouth daily. 90 tablet 3   pantoprazole  (PROTONIX ) 20 MG tablet Take 1 tablet (20 mg total) by mouth 2 (two) times daily. 180 tablet 3   pyridOXINE (VITAMIN B-6) 50 MG tablet Take  1 tablet (50 mg total) by mouth daily. 90 tablet 3   triamcinolone  cream (KENALOG ) 0.1 % Apply 1 Application topically 2 (two) times daily. 30 g 0   No current facility-administered medications for this visit.    PHYSICAL EXAMINATION: ECOG PERFORMANCE STATUS: 0 - Asymptomatic  Vitals:   10/03/24 0929  BP: (!) 141/83  Pulse: 83  Resp: 17  Temp: 97.9 F (36.6 C)  SpO2: 98%   Wt Readings from Last 3 Encounters:  10/03/24 191 lb 1.6 oz (86.7 kg)  09/30/24 191 lb (86.6 kg)  05/30/24 189 lb 1.3 oz (85.8 kg)     GENERAL:alert, no distress and comfortable SKIN: skin color, texture, turgor are normal, no rashes or significant lesions EYES: normal, Conjunctiva are pink and non-injected, sclera clear NECK: supple, thyroid  normal size, non-tender, without nodularity LYMPH:  no palpable lymphadenopathy in the cervical, axillary  LUNGS: clear to auscultation and percussion with normal breathing effort HEART: regular rate & rhythm and no murmurs and no lower extremity edema ABDOMEN:abdomen soft, non-tender and normal bowel sounds Musculoskeletal:no cyanosis of digits and no clubbing  NEURO: alert & oriented x 3 with fluent speech, no focal motor/sensory deficits BREAST: Lump in right breast at incision site, 1-2 cm, 1 o'clock position, mildly tender on palpation.  No other palpable breast mass or axillary adenopathy. Physical Exam   LABORATORY DATA:  I have reviewed the data as listed    Latest Ref Rng & Units 10/03/2024    9:02 AM 09/23/2023    8:00 AM 06/18/2023   11:30 AM  CBC  WBC 4.0 - 10.5 K/uL 6.4  5.9  5.4   Hemoglobin 12.0 - 15.0 g/dL 85.4  84.7  85.2   Hematocrit 36.0 - 46.0 % 40.5  42.8  43.4   Platelets 150 - 400 K/uL 166  201  201.0         Latest Ref Rng & Units 10/03/2024    9:02 AM 05/30/2024    9:40 AM 09/23/2023    8:00 AM  CMP  Glucose 70 - 99 mg/dL 851  876  854   BUN 8 - 23 mg/dL 16  12  10    Creatinine 0.44 - 1.00 mg/dL 9.17  9.18  9.15   Sodium 135 - 145  mmol/L 137  139  138  Potassium 3.5 - 5.1 mmol/L 3.5  3.7  3.6   Chloride 98 - 111 mmol/L 98  99  101   CO2 22 - 32 mmol/L 25  20  29    Calcium  8.9 - 10.3 mg/dL 89.7  89.9  9.7   Total Protein 6.5 - 8.1 g/dL 8.6  7.6  7.9   Total Bilirubin 0.0 - 1.2 mg/dL 0.5  0.6  0.7   Alkaline Phos 38 - 126 U/L 57  51  43   AST 15 - 41 U/L 30  30  27    ALT 0 - 44 U/L 51  46  36       RADIOGRAPHIC STUDIES: I have personally reviewed the radiological images as listed and agreed with the findings in the report. No results found.    Orders Placed This Encounter  Procedures   CT ABDOMEN PELVIS W CONTRAST    Standing Status:   Future    Expected Date:   11/03/2024    Expiration Date:   10/03/2025    If indicated for the ordered procedure, I authorize the administration of contrast media per Radiology protocol:   Yes    Does the patient have a contrast media/X-ray dye allergy?:   No    Preferred imaging location?:   [facility]    If indicated for the ordered procedure, I authorize the administration of oral contrast media per Radiology protocol:   Yes   All questions were answered. The patient knows to call the clinic with any problems, questions or concerns. No barriers to learning was detected. The total time spent in the appointment was 25 minutes, including review of chart and various tests results, discussions about plan of care and coordination of care plan     Onita Mattock, MD 10/03/2024     "

## 2024-10-03 NOTE — Progress Notes (Signed)
 As per Dr. Lanny placed order successfully in portal for [facility] at [facility] in 05/2025.

## 2024-10-11 ENCOUNTER — Encounter: Payer: Self-pay | Admitting: Gastroenterology

## 2024-10-17 ENCOUNTER — Other Ambulatory Visit: Payer: Self-pay

## 2024-11-03 ENCOUNTER — Ambulatory Visit (HOSPITAL_COMMUNITY)

## 2024-11-28 ENCOUNTER — Ambulatory Visit: Admitting: Family Medicine

## 2024-12-01 ENCOUNTER — Ambulatory Visit

## 2025-10-09 ENCOUNTER — Inpatient Hospital Stay
